# Patient Record
Sex: Male | Born: 1950
Health system: Southern US, Community
[De-identification: ages and names within clinical notes are randomized; demographics above are authoritative.]

## PROBLEM LIST (undated history)

## (undated) DIAGNOSIS — N62 Hypertrophy of breast: Secondary | ICD-10-CM

## (undated) DIAGNOSIS — M4802 Spinal stenosis, cervical region: Secondary | ICD-10-CM

## (undated) DIAGNOSIS — R001 Bradycardia, unspecified: Secondary | ICD-10-CM

## (undated) DIAGNOSIS — G9589 Other specified diseases of spinal cord: Secondary | ICD-10-CM

## (undated) DIAGNOSIS — I255 Ischemic cardiomyopathy: Secondary | ICD-10-CM

## (undated) DIAGNOSIS — K219 Gastro-esophageal reflux disease without esophagitis: Secondary | ICD-10-CM

## (undated) DIAGNOSIS — K621 Rectal polyp: Secondary | ICD-10-CM

## (undated) DIAGNOSIS — I2510 Atherosclerotic heart disease of native coronary artery without angina pectoris: Secondary | ICD-10-CM

## (undated) DIAGNOSIS — I480 Paroxysmal atrial fibrillation: Secondary | ICD-10-CM

## (undated) DIAGNOSIS — Z7902 Long term (current) use of antithrombotics/antiplatelets: Secondary | ICD-10-CM

## (undated) DIAGNOSIS — I10 Essential (primary) hypertension: Secondary | ICD-10-CM

## (undated) DIAGNOSIS — K649 Unspecified hemorrhoids: Secondary | ICD-10-CM

## (undated) DIAGNOSIS — I639 Cerebral infarction, unspecified: Secondary | ICD-10-CM

## (undated) DIAGNOSIS — Z87891 Personal history of nicotine dependence: Secondary | ICD-10-CM

## (undated) DIAGNOSIS — Z7982 Long term (current) use of aspirin: Secondary | ICD-10-CM

## (undated) DIAGNOSIS — I739 Peripheral vascular disease, unspecified: Secondary | ICD-10-CM

## (undated) DIAGNOSIS — Z91041 Radiographic dye allergy status: Secondary | ICD-10-CM

## (undated) DIAGNOSIS — E785 Hyperlipidemia, unspecified: Secondary | ICD-10-CM

## (undated) DIAGNOSIS — I200 Unstable angina: Secondary | ICD-10-CM

## (undated) DIAGNOSIS — G5602 Carpal tunnel syndrome, left upper limb: Secondary | ICD-10-CM

## (undated) DIAGNOSIS — I82722 Chronic embolism and thrombosis of deep veins of left upper extremity: Secondary | ICD-10-CM

## (undated) DIAGNOSIS — G5603 Carpal tunnel syndrome, bilateral upper limbs: Secondary | ICD-10-CM

## (undated) DIAGNOSIS — I2119 ST elevation (STEMI) myocardial infarction involving other coronary artery of inferior wall: Secondary | ICD-10-CM

## (undated) DIAGNOSIS — F1020 Alcohol dependence, uncomplicated: Secondary | ICD-10-CM

## (undated) HISTORY — PX: KNEE SURGERY: SHX244

## (undated) HISTORY — DX: Essential (primary) hypertension: I10

## (undated) HISTORY — DX: Hyperlipidemia, unspecified: E78.5

## (undated) HISTORY — DX: Unspecified hemorrhoids: K64.9

## (undated) HISTORY — DX: Unstable angina: I20.0

## (undated) HISTORY — DX: Peripheral vascular disease, unspecified: I73.9

## (undated) HISTORY — DX: Rectal polyp: K62.1

## (undated) HISTORY — PX: CARDIAC CATHETERIZATION: SHX172

---

## 1973-07-23 HISTORY — PX: APPENDECTOMY: SHX54

## 2005-04-24 ENCOUNTER — Inpatient Hospital Stay: Payer: Self-pay | Admitting: Internal Medicine

## 2005-04-24 ENCOUNTER — Other Ambulatory Visit: Payer: Self-pay

## 2005-09-25 ENCOUNTER — Inpatient Hospital Stay: Payer: Self-pay | Admitting: Internal Medicine

## 2005-09-25 ENCOUNTER — Other Ambulatory Visit: Payer: Self-pay

## 2005-09-26 DIAGNOSIS — I639 Cerebral infarction, unspecified: Secondary | ICD-10-CM

## 2005-09-26 HISTORY — DX: Cerebral infarction, unspecified: I63.9

## 2007-09-23 ENCOUNTER — Ambulatory Visit: Payer: Self-pay | Admitting: Gastroenterology

## 2007-10-17 ENCOUNTER — Emergency Department: Payer: Self-pay | Admitting: Emergency Medicine

## 2009-07-23 DIAGNOSIS — I639 Cerebral infarction, unspecified: Secondary | ICD-10-CM

## 2009-07-23 DIAGNOSIS — Z8673 Personal history of transient ischemic attack (TIA), and cerebral infarction without residual deficits: Secondary | ICD-10-CM | POA: Insufficient documentation

## 2009-07-23 HISTORY — DX: Cerebral infarction, unspecified: I63.9

## 2010-05-10 ENCOUNTER — Ambulatory Visit: Payer: Self-pay | Admitting: Specialist

## 2010-06-14 ENCOUNTER — Ambulatory Visit: Payer: Self-pay | Admitting: Specialist

## 2010-06-23 ENCOUNTER — Ambulatory Visit: Payer: Self-pay | Admitting: Specialist

## 2011-07-24 HISTORY — PX: ROTATOR CUFF REPAIR: SHX139

## 2014-07-26 DIAGNOSIS — L309 Dermatitis, unspecified: Secondary | ICD-10-CM

## 2014-07-26 HISTORY — DX: Dermatitis, unspecified: L30.9

## 2014-11-08 ENCOUNTER — Encounter: Payer: Self-pay | Admitting: General Surgery

## 2014-11-08 ENCOUNTER — Ambulatory Visit (INDEPENDENT_AMBULATORY_CARE_PROVIDER_SITE_OTHER): Payer: 59 | Admitting: General Surgery

## 2014-11-08 VITALS — BP 130/74 | Ht 67.0 in | Wt 184.0 lb

## 2014-11-08 DIAGNOSIS — K603 Anal fistula: Secondary | ICD-10-CM

## 2014-11-08 DIAGNOSIS — K610 Anal abscess: Secondary | ICD-10-CM

## 2014-11-08 NOTE — Patient Instructions (Addendum)
Anal Fistula An anal fistula is an abnormal tunnel that develops between the bowel and skin near the outside of the anus, where feces comes out. The anus has a number of tiny glands that make lubricating fluid. Sometimes these glands can become plugged and infected. This may lead to the development of a fluid-filled pocket (abscess). An anal fistula often develops after this infection or abscess. It is nearly always caused by a past or current anal abscess.  CAUSES  Though an anal fistula is almost always caused by a past or current anal abscess, other causes can include:  A complication of surgery.  Trauma to the rectal area.  Radiation to the area.  Other medical conditions or diseases, such as:   Chronic inflammatory bowel disease, such as Crohn disease or ulcerative colitis.   Colon or rectal cancer.   Diverticular disease, such as diverticulitis.   A sexually transmitted disease, such as gonorrhea, chlamydia, or syphilis.  An HIV infection or AIDS.  SYMPTOMS   Throbbing or constant pain that may be worse when sitting.   Swelling or irritation around the anus.   Drainage of pus or blood from an opening near the anus.   Pain with bowel movements.  Fever or chills. DIAGNOSIS  Your caregiver will examine the area to find the openings of the anal fistula and the fistula tract. The external opening of the anal fistula may be seen during a physical examination. Other examinations that may be performed include:   Examination of the rectal area with a gloved hand (digital rectal exam).   Examination with a probe or scope to help locate the internal opening of the fistula.   Injection of a dye into the fistula opening. X-rays can be taken to find the exact location and path of the fistula.   An MRI or ultrasound of the anal area.  Other tests may be performed to find the cause of the anal fistula.   TREATMENT  The most common treatment for an anal fistula is  surgery. There are different surgery options depending on where your fistula is located and how complex the fistula is. Surgical options include:  A fistulotomy. This surgery involves opening up the whole fistula and draining the contents inside to promote healing.  Seton placement. A silk string (seton) is placed into the fistula during a fistulotomy to drain any infection to promote healing.  Advancement flap procedure. Tissue is removed from your rectum or the skin around the anus and is attached to the opening of the fistula.  Bioprosthetic plug. A cone-shaped plug is made from your tissue and is used to block the opening of the fistula. Some anal fistulas do not require surgery. A fibrin glue is a non-surgical option that involves injecting the glue to seal the fistula. You also may be prescribed an antibiotic medicine to treat an infection.  HOME CARE INSTRUCTIONS   Take your antibiotics as directed. Finish them even if you start to feel better.  Only take over-the-counter or prescription medicines as directed by your caregiver.Use a stool softener or laxative, if recommended.   Eat a high-fiber diet to help avoid constipation or as directed by your caregiver.  Drink enough water to keep your urine clear or pale yellow.   A warm sitz bath may be soothing and help with healing. You may take warm sitz baths for 15-20 minutes, 3-4 times a day to ease pain and discomfort.   Follow excellent hygiene to keep the anal area  as clean and dry as possible. Use wet toilet paper or moist towelettes after each bowel movement.  SEEK MEDICAL CARE IF: You have increased pain not controlled with medicines.  SEEK IMMEDIATE MEDICAL CARE IF:  You have severe, intolerable pain.  You have new swelling, redness, or discharge around the anal area.  You have tenderness or warmth around the anal area.  You have chills or diarrhea.  You have severe problems urinating or having a bowel movement.    You have a fever or persistent symptoms for more than 2-3 days.   You have a fever and your symptoms suddenly get worse.  MAKE SURE YOU:   Understand these instructions.  Will watch your condition.  Will get help right away if you are not doing well or get worse. Document Released: 06/21/2008 Document Revised: 06/25/2012 Document Reviewed: 05/14/2011 [facility] Patient Information 2015 [place], [place]. This information is not intended to replace advice given to you by your health care provider. Make sure you discuss any questions you have with your health care provider.   Keep area clean. Warm water soaks as needed. Use wet wipes for cleaning. Follow up in one month.

## 2014-11-08 NOTE — Progress Notes (Signed)
Patient ID: Todd Weaver, male   DOB: 03/26/1951, 64 y.o.   MRN: [id number]  Chief Complaint  Patient presents with  . Other    boil on rectum    HPI Todd Weaver is a 64 y.o. male here today for a evaluation of a boil on rectum. Patient states he noticed this on Thursday. He states that the area has broken open yesterday and the pain is greatly lessened. He does have a history of this that seems to occur about once every couple of years. He had a colonoscopy done about 3 years ago that was reported as normal.  HPI  Past Medical History  Diagnosis Date  . Hyperlipidemia   . Hypertension   . Hemorrhoid     Past Surgical History  Procedure Laterality Date  . Rotator cuff repair Right 2013  . Appendectomy  1975  . Knee surgery Right age 62    History reviewed. No pertinent family history.  Social History History  Substance Use Topics  . Smoking status: Former Smoker    Quit date: 07/23/1998  . Smokeless tobacco: Never Used  . Alcohol Use: 0.0 oz/week    0 Standard drinks or equivalent per week    Allergies  Allergen Reactions  . Shellfish Allergy Hives    Current Outpatient Prescriptions  Medication Sig Dispense Refill  . aspirin EC 81 MG tablet Take 81 mg by mouth once.     Marland Kitchen atenolol (TENORMIN) 100 MG tablet Take 100 mg by mouth daily.   5  . atorvastatin (LIPITOR) 20 MG tablet Take 20 mg by mouth daily.   5   No current facility-administered medications for this visit.    Review of Systems Review of Systems  Constitutional: Negative.   Respiratory: Negative.   Cardiovascular: Negative.     Blood pressure 130/74, height 5\' 7"  (1.702 m), weight 184 lb (83.462 kg).  Physical Exam Physical Exam  Constitutional: He is oriented to person, place, and time. He appears well-developed and well-nourished.  Eyes: Conjunctivae are normal.  Neck: Neck supple.  Cardiovascular: Normal rate, regular rhythm and normal heart sounds.   Pulmonary/Chest: Effort normal  and breath sounds normal.  Abdominal: Soft. Bowel sounds are normal.  Genitourinary:  Purulent discharge from opening in the posterior region, 6 o'clock area, 1.5 cm from anus. Mildly tender.   Lymphadenopathy:    He has no cervical adenopathy.  Neurological: He is alert and oriented to person, place, and time.  Skin: Skin is warm and dry.    Data Reviewed Notes reviewed   Assessment    Based on history he may have a fistula. At present the abscess has drained spontaneously and he is feeling much better.      Plan    Above discussed with pt. Will reassess in 4-6 weeks.      PCP:  Orvilla Cornwall 11/09/2014, 6:39 AM

## 2014-11-09 ENCOUNTER — Encounter: Payer: Self-pay | Admitting: General Surgery

## 2014-12-06 ENCOUNTER — Encounter: Payer: Self-pay | Admitting: General Surgery

## 2014-12-06 ENCOUNTER — Ambulatory Visit (INDEPENDENT_AMBULATORY_CARE_PROVIDER_SITE_OTHER): Payer: 59 | Admitting: General Surgery

## 2014-12-06 VITALS — BP 120/68 | HR 76 | Resp 12 | Ht 67.0 in | Wt 184.0 lb

## 2014-12-06 DIAGNOSIS — K603 Anal fistula, unspecified: Secondary | ICD-10-CM

## 2014-12-06 NOTE — Patient Instructions (Addendum)
Anal Fistula An anal fistula is an abnormal tunnel that develops between the bowel and skin near the outside of the anus, where feces comes out. The anus has a number of tiny glands that make lubricating fluid. Sometimes these glands can become plugged and infected. This may lead to the development of a fluid-filled pocket (abscess). An anal fistula often develops after this infection or abscess. It is nearly always caused by a past or current anal abscess.  CAUSES  Though an anal fistula is almost always caused by a past or current anal abscess, other causes can include:  A complication of surgery.  Trauma to the rectal area.  Radiation to the area.  Other medical conditions or diseases, such as:   Chronic inflammatory bowel disease, such as Crohn disease or ulcerative colitis.   Colon or rectal cancer.   Diverticular disease, such as diverticulitis.   A sexually transmitted disease, such as gonorrhea, chlamydia, or syphilis.  An HIV infection or AIDS.  SYMPTOMS   Throbbing or constant pain that may be worse when sitting.   Swelling or irritation around the anus.   Drainage of pus or blood from an opening near the anus.   Pain with bowel movements.  Fever or chills. DIAGNOSIS  Your caregiver will examine the area to find the openings of the anal fistula and the fistula tract. The external opening of the anal fistula may be seen during a physical examination. Other examinations that may be performed include:   Examination of the rectal area with a gloved hand (digital rectal exam).   Examination with a probe or scope to help locate the internal opening of the fistula.   Injection of a dye into the fistula opening. X-rays can be taken to find the exact location and path of the fistula.   An MRI or ultrasound of the anal area.  Other tests may be performed to find the cause of the anal fistula.   TREATMENT  The most common treatment for an anal fistula is  surgery. There are different surgery options depending on where your fistula is located and how complex the fistula is. Surgical options include:  A fistulotomy. This surgery involves opening up the whole fistula and draining the contents inside to promote healing.  Seton placement. A silk string (seton) is placed into the fistula during a fistulotomy to drain any infection to promote healing.  Advancement flap procedure. Tissue is removed from your rectum or the skin around the anus and is attached to the opening of the fistula.  Bioprosthetic plug. A cone-shaped plug is made from your tissue and is used to block the opening of the fistula. Some anal fistulas do not require surgery. A fibrin glue is a non-surgical option that involves injecting the glue to seal the fistula. You also may be prescribed an antibiotic medicine to treat an infection.  HOME CARE INSTRUCTIONS   Take your antibiotics as directed. Finish them even if you start to feel better.  Only take over-the-counter or prescription medicines as directed by your caregiver.Use a stool softener or laxative, if recommended.   Eat a high-fiber diet to help avoid constipation or as directed by your caregiver.  Drink enough water to keep your urine clear or pale yellow.   A warm sitz bath may be soothing and help with healing. You may take warm sitz baths for 15-20 minutes, 3-4 times a day to ease pain and discomfort.   Follow excellent hygiene to keep the anal area  as clean and dry as possible. Use wet toilet paper or moist towelettes after each bowel movement.  SEEK MEDICAL CARE IF: You have increased pain not controlled with medicines.  SEEK IMMEDIATE MEDICAL CARE IF:  You have severe, intolerable pain.  You have new swelling, redness, or discharge around the anal area.  You have tenderness or warmth around the anal area.  You have chills or diarrhea.  You have severe problems urinating or having a bowel movement.    You have a fever or persistent symptoms for more than 2-3 days.   You have a fever and your symptoms suddenly get worse.  MAKE SURE YOU:   Understand these instructions.  Will watch your condition.  Will get help right away if you are not doing well or get worse. Document Released: 06/21/2008 Document Revised: 06/25/2012 Document Reviewed: 05/14/2011 [facility] Patient Information 2015 [place], [place]. This information is not intended to replace advice given to you by your health care provider. Make sure you discuss any questions you have with your health care provider.  Patient's surgery has been scheduled for 12-21-14 at [facility]. It is okay for patient to continue 81 mg aspirin once daily.

## 2014-12-06 NOTE — Progress Notes (Signed)
Patient ID: Todd Weaver, male   DOB: 05/16/1951, 64 y.o.   MRN: [id number]  Chief Complaint  Patient presents with  . Follow-up    perianal abscess and anal fistula    HPI Todd Weaver is a 64 y.o. male   here today following up from a perianal abscess and anal fistula. He reports the area has been itchy and irritated. He does report some drainage.  HPI  Past Medical History  Diagnosis Date  . Hyperlipidemia   . Hypertension   . Hemorrhoid     Past Surgical History  Procedure Laterality Date  . Rotator cuff repair Right 2013  . Appendectomy  1975  . Knee surgery Right age 33    No family history on file.  Social History History  Substance Use Topics  . Smoking status: Former Smoker    Quit date: 07/23/1998  . Smokeless tobacco: Never Used  . Alcohol Use: 0.0 oz/week    0 Standard drinks or equivalent per week    Allergies  Allergen Reactions  . Shellfish Allergy Hives    Current Outpatient Prescriptions  Medication Sig Dispense Refill  . aspirin EC 81 MG tablet Take 81 mg by mouth once.     Marland Kitchen atenolol (TENORMIN) 100 MG tablet Take 100 mg by mouth daily.   5  . atorvastatin (LIPITOR) 20 MG tablet Take 20 mg by mouth daily.   5   No current facility-administered medications for this visit.    Review of Systems Review of Systems  Constitutional: Negative.   Respiratory: Negative.   Cardiovascular: Negative.     Blood pressure 120/68, pulse 76, resp. rate 12, height 5\' 7"  (1.702 m), weight 184 lb (83.462 kg).  Physical Exam Physical Exam  Constitutional: He is oriented to person, place, and time. He appears well-developed and well-nourished.  Eyes: Conjunctivae are normal.  Neck: Neck supple. No thyromegaly present.  Cardiovascular: Normal rate, regular rhythm and normal heart sounds.   Pulmonary/Chest: Effort normal and breath sounds normal.  Abdominal: Soft. Normal appearance and bowel sounds are normal. There is no tenderness.  Genitourinary:      Neurological: He is alert and oriented to person, place, and time.  Skin: Skin is warm and dry.  External opening at 6 ocl, 1.5 cm from anal opening. Small amount of drainage. No abscess noted now  Data Reviewed None   Assessment    Anal fistula     Plan   Dicussed anal fistula surgery including possible ways to treat-lay it open remove the tract or use fibrin plug. Risks and benefits also explained. Pt is agreeable.     Patient's surgery has been scheduled for 12-21-14 at [facility]. It is okay for patient to continue 81 mg aspirin once daily.   Jermani Eberlein G 12/06/2014, 4:32 PM

## 2014-12-06 NOTE — Progress Notes (Deleted)
This is a 64 year old male here today following up from a perianal abscess and anal fistula. He reports the area has been itchy and irritated. He does report some drainage.

## 2014-12-14 ENCOUNTER — Encounter: Payer: Self-pay | Admitting: *Deleted

## 2014-12-14 ENCOUNTER — Other Ambulatory Visit: Payer: Self-pay

## 2014-12-14 DIAGNOSIS — Z91013 Allergy to seafood: Secondary | ICD-10-CM | POA: Diagnosis not present

## 2014-12-14 DIAGNOSIS — E785 Hyperlipidemia, unspecified: Secondary | ICD-10-CM | POA: Diagnosis not present

## 2014-12-14 DIAGNOSIS — Z79899 Other long term (current) drug therapy: Secondary | ICD-10-CM | POA: Diagnosis not present

## 2014-12-14 DIAGNOSIS — K603 Anal fistula: Secondary | ICD-10-CM | POA: Diagnosis present

## 2014-12-14 DIAGNOSIS — I10 Essential (primary) hypertension: Secondary | ICD-10-CM | POA: Diagnosis not present

## 2014-12-14 DIAGNOSIS — Z7982 Long term (current) use of aspirin: Secondary | ICD-10-CM | POA: Diagnosis not present

## 2014-12-14 DIAGNOSIS — Z87891 Personal history of nicotine dependence: Secondary | ICD-10-CM | POA: Diagnosis not present

## 2014-12-14 DIAGNOSIS — K648 Other hemorrhoids: Secondary | ICD-10-CM | POA: Diagnosis not present

## 2014-12-14 NOTE — Patient Instructions (Signed)
  Your procedure is scheduled on: 12-21-14 Report to [place] To find out your arrival time please call [phone] between 1PM - 3PM on 12-17-14 (FRIDAY)  Remember: Instructions that are not followed completely may result in serious medical risk, up to and including death, or upon the discretion of your surgeon and anesthesiologist your surgery may need to be rescheduled.    _X___ 1. Do not eat food or drink liquids after midnight. No gum chewing or hard candies.     _X___ 2. No Alcohol for 24 hours before or after surgery.   ____ 3. Bring all medications with you on the day of surgery if instructed.    _X___ 4. Notify your doctor if there is any change in your medical condition     (cold, fever, infections).     Do not wear jewelry, make-up, hairpins, clips or nail polish.  Do not wear lotions, powders, or perfumes. You may wear deodorant.  Do not shave 48 hours prior to surgery. Men may shave face and neck.  Do not bring valuables to the hospital.    [facility] is not responsible for any belongings or valuables.               Contacts, dentures or bridgework may not be worn into surgery.  Leave your suitcase in the car. After surgery it may be brought to your room.  For patients admitted to the hospital, discharge time is determined by your                treatment team.   Patients discharged the day of surgery will not be allowed to drive home.   Please read over the following fact sheets that you were given:              CHG INSTRUCTIONS   __X__ Take these medicines the morning of surgery with A SIP OF WATER:    1. ATENOLOL  2. LIPITOR  3.   4.  5.  6.  ____ Fleet Enema (as directed)   ____ Use CHG Soap as directed  ____ Use inhalers on the day of surgery  ____ Stop metformin 2 days prior to surgery    ____ Take 1/2 of usual insulin dose the night before surgery and none on the morning of surgery.   __X__ Stop  Coumadin/Plavix/aspirin-ASK DR Jamal Collin ABOUT ASPIRIN  ____ Stop Anti-inflammatories-NO NSAIDS OR ASPIRIN PRODUCTS-TYLENOL OK   ____ Stop supplements until after surgery.    ____ Bring C-Pap to the hospital.

## 2014-12-16 ENCOUNTER — Encounter
Admission: RE | Admit: 2014-12-16 | Discharge: 2014-12-16 | Disposition: A | Discharge status: Home / Self Care | Payer: 59 | Source: Ambulatory Visit | Attending: Anesthesiology | Admitting: Anesthesiology

## 2014-12-16 DIAGNOSIS — I10 Essential (primary) hypertension: Secondary | ICD-10-CM | POA: Insufficient documentation

## 2014-12-16 DIAGNOSIS — Z01810 Encounter for preprocedural cardiovascular examination: Secondary | ICD-10-CM | POA: Diagnosis present

## 2014-12-21 ENCOUNTER — Encounter: Payer: Self-pay | Admitting: *Deleted

## 2014-12-21 ENCOUNTER — Ambulatory Visit: Payer: 59 | Admitting: Anesthesiology

## 2014-12-21 ENCOUNTER — Ambulatory Visit
Admission: RE | Admit: 2014-12-21 | Discharge: 2014-12-21 | Disposition: A | Discharge status: Home / Self Care | Payer: 59 | Source: Ambulatory Visit | Attending: General Surgery | Admitting: General Surgery

## 2014-12-21 ENCOUNTER — Encounter
Admission: RE | Disposition: A | Discharge status: Home / Self Care | Payer: Self-pay | Source: Ambulatory Visit | Attending: General Surgery

## 2014-12-21 DIAGNOSIS — K603 Anal fistula: Secondary | ICD-10-CM | POA: Insufficient documentation

## 2014-12-21 DIAGNOSIS — E785 Hyperlipidemia, unspecified: Secondary | ICD-10-CM | POA: Insufficient documentation

## 2014-12-21 DIAGNOSIS — Z7982 Long term (current) use of aspirin: Secondary | ICD-10-CM | POA: Insufficient documentation

## 2014-12-21 DIAGNOSIS — Z91013 Allergy to seafood: Secondary | ICD-10-CM | POA: Insufficient documentation

## 2014-12-21 DIAGNOSIS — Z87891 Personal history of nicotine dependence: Secondary | ICD-10-CM | POA: Insufficient documentation

## 2014-12-21 DIAGNOSIS — K641 Second degree hemorrhoids: Secondary | ICD-10-CM | POA: Diagnosis not present

## 2014-12-21 DIAGNOSIS — K648 Other hemorrhoids: Secondary | ICD-10-CM | POA: Insufficient documentation

## 2014-12-21 DIAGNOSIS — I10 Essential (primary) hypertension: Secondary | ICD-10-CM | POA: Insufficient documentation

## 2014-12-21 DIAGNOSIS — Z79899 Other long term (current) drug therapy: Secondary | ICD-10-CM | POA: Insufficient documentation

## 2014-12-21 HISTORY — DX: Gastro-esophageal reflux disease without esophagitis: K21.9

## 2014-12-21 HISTORY — PX: HEMORRHOID SURGERY: SHX153

## 2014-12-21 HISTORY — PX: ANAL FISTULECTOMY: SHX1139

## 2014-12-21 HISTORY — DX: Cerebral infarction, unspecified: I63.9

## 2014-12-21 SURGERY — FISTULECTOMY, ANAL
Anesthesia: General | Site: Anus | Wound class: Clean Contaminated

## 2014-12-21 MED ORDER — FENTANYL CITRATE (PF) 100 MCG/2ML IJ SOLN: 25.0000 ug | INTRAMUSCULAR | Status: DC | PRN

## 2014-12-21 MED ORDER — BACIT-POLY-NEO HC 1 % EX OINT
TOPICAL_OINTMENT | CUTANEOUS | Status: DC | PRN
  Administered 2014-12-21: 1

## 2014-12-21 MED ORDER — GLYCOPYRROLATE 0.2 MG/ML IJ SOLN
0.2000 mg | Freq: Once | INTRAMUSCULAR | Status: AC
  Administered 2014-12-21: 0.2 mg via INTRAVENOUS

## 2014-12-21 MED ORDER — OXYCODONE-ACETAMINOPHEN 5-325 MG PO TABS: 1.0000 | ORAL_TABLET | ORAL | Status: DC | PRN

## 2014-12-21 MED ORDER — BUPIVACAINE HCL (PF) 0.5 % IJ SOLN
INTRAMUSCULAR | Status: DC | PRN
  Administered 2014-12-21: 17 mL

## 2014-12-21 MED ORDER — BUPIVACAINE HCL (PF) 0.5 % IJ SOLN
INTRAMUSCULAR | Status: AC
  Filled 2014-12-21: qty 30

## 2014-12-21 MED ORDER — PROPOFOL 10 MG/ML IV BOLUS
INTRAVENOUS | Status: DC | PRN
  Administered 2014-12-21: 140 mg via INTRAVENOUS

## 2014-12-21 MED ORDER — FAMOTIDINE 20 MG PO TABS
ORAL_TABLET | ORAL | Status: AC
  Administered 2014-12-21: 20 mg via ORAL
  Filled 2014-12-21: qty 1

## 2014-12-21 MED ORDER — OXYCODONE-ACETAMINOPHEN 5-325 MG PO TABS
1.0000 | ORAL_TABLET | ORAL | Status: DC | PRN
Start: 2014-12-21 — End: 2014-12-21
  Administered 2014-12-21: 1 via ORAL

## 2014-12-21 MED ORDER — MIDAZOLAM HCL 2 MG/2ML IJ SOLN
INTRAMUSCULAR | Status: DC | PRN
  Administered 2014-12-21: 2 mg via INTRAVENOUS

## 2014-12-21 MED ORDER — GLYCOPYRROLATE 0.2 MG/ML IJ SOLN
INTRAMUSCULAR | Status: AC
  Filled 2014-12-21: qty 1

## 2014-12-21 MED ORDER — LIDOCAINE HCL (PF) 1 % IJ SOLN
INTRAMUSCULAR | Status: AC
  Filled 2014-12-21: qty 30

## 2014-12-21 MED ORDER — FENTANYL CITRATE (PF) 100 MCG/2ML IJ SOLN
INTRAMUSCULAR | Status: DC | PRN
  Administered 2014-12-21: 25 ug via INTRAVENOUS
  Administered 2014-12-21: 50 ug via INTRAVENOUS

## 2014-12-21 MED ORDER — ONDANSETRON HCL 4 MG/2ML IJ SOLN: 4.0000 mg | Freq: Once | INTRAMUSCULAR | Status: DC | PRN

## 2014-12-21 MED ORDER — GLYCOPYRROLATE 0.2 MG/ML IJ SOLN
INTRAMUSCULAR | Status: AC
  Administered 2014-12-21: 0.2 mg via INTRAVENOUS
  Filled 2014-12-21: qty 1

## 2014-12-21 MED ORDER — ACETAMINOPHEN 10 MG/ML IV SOLN
INTRAVENOUS | Status: AC
  Filled 2014-12-21: qty 100

## 2014-12-21 MED ORDER — LACTATED RINGERS IV SOLN
INTRAVENOUS | Status: DC
  Administered 2014-12-21 (×2): via INTRAVENOUS

## 2014-12-21 MED ORDER — HYDROMORPHONE HCL 1 MG/ML IJ SOLN: 0.2500 mg | INTRAMUSCULAR | Status: DC | PRN

## 2014-12-21 MED ORDER — BACITRACIN ZINC 500 UNIT/GM EX OINT
TOPICAL_OINTMENT | CUTANEOUS | Status: AC
  Filled 2014-12-21: qty 28.35

## 2014-12-21 MED ORDER — FAMOTIDINE 20 MG PO TABS
20.0000 mg | ORAL_TABLET | Freq: Once | ORAL | Status: AC
  Administered 2014-12-21: 20 mg via ORAL

## 2014-12-21 MED ORDER — LIDOCAINE HCL (CARDIAC) 20 MG/ML IV SOLN
INTRAVENOUS | Status: DC | PRN
  Administered 2014-12-21: 30 mg via INTRAVENOUS

## 2014-12-21 MED ORDER — OXYCODONE-ACETAMINOPHEN 5-325 MG PO TABS
ORAL_TABLET | ORAL | Status: AC
  Filled 2014-12-21: qty 1

## 2014-12-21 SURGICAL SUPPLY — 25 items
BLADE SURG 11 STRL SS SAFETY (MISCELLANEOUS) ×4 IMPLANT
BLADE SURG 15 STRL SS SAFETY (BLADE) ×4 IMPLANT
BRIEF STRETCH MATERNITY 2XLG (MISCELLANEOUS) ×4 IMPLANT
CANISTER SUCT 1200ML W/VALVE (MISCELLANEOUS) ×4 IMPLANT
DECANTER SPIKE VIAL GLASS SM (MISCELLANEOUS) ×4 IMPLANT
DRAPE LEGGINS SURG 28X43 STRL (DRAPES) ×4 IMPLANT
DRAPE PED LAPAROTOMY (DRAPES) ×4 IMPLANT
DRAPE UNDER BUTTOCK W/FLU (DRAPES) ×4 IMPLANT
GLOVE BIO SURGEON STRL SZ7 (GLOVE) ×12 IMPLANT
GOWN STRL REUS W/ TWL LRG LVL3 (GOWN DISPOSABLE) ×4 IMPLANT
GOWN STRL REUS W/TWL LRG LVL3 (GOWN DISPOSABLE) ×4
JELLY LUB 2OZ STRL (MISCELLANEOUS) ×2
JELLY LUBE 2OZ STRL (MISCELLANEOUS) ×2 IMPLANT
LABEL OR SOLS (LABEL) ×4 IMPLANT
NDL SAFETY 25GX1.5 (NEEDLE) ×4 IMPLANT
PACK BASIN MINOR ARMC (MISCELLANEOUS) ×4 IMPLANT
PAD GROUND ADULT SPLIT (MISCELLANEOUS) ×4 IMPLANT
PAD OB MATERNITY 4.3X12.25 (PERSONAL CARE ITEMS) ×4 IMPLANT
PAD PREP 24X41 OB/GYN DISP (PERSONAL CARE ITEMS) ×4 IMPLANT
SOL PREP PVP 2OZ (MISCELLANEOUS) ×4
SOLUTION PREP PVP 2OZ (MISCELLANEOUS) ×2 IMPLANT
STRAP SAFETY BODY (MISCELLANEOUS) ×4 IMPLANT
SUT VIC AB 3-0 SH 27 (SUTURE) ×2
SUT VIC AB 3-0 SH 27X BRD (SUTURE) ×2 IMPLANT
SYR CONTROL 10ML (SYRINGE) ×4 IMPLANT

## 2014-12-21 NOTE — OR Nursing (Signed)
Dr Kayleen Memos aware of heart rate still low at 40-42, patient asymptomatic, bp stable, repeated Robinol 0.2mg  iv

## 2014-12-21 NOTE — Transfer of Care (Signed)
Immediate Anesthesia Transfer of Care Note  Patient: Todd Weaver  Procedure(s) Performed: Procedure(s): FISTULECTOMY ANAL (N/A) HEMORRHOIDECTOMY (N/A)  Patient Location: PACU  Anesthesia Type:General  Level of Consciousness: sedated  Airway & Oxygen Therapy: Patient Spontanous Breathing and Patient connected to face mask oxygen  Post-op Assessment: Report given to RN and Post -op Vital signs reviewed and stable  Post vital signs: Reviewed and stable  Last Vitals:  97.2  009/38 45 16  Complications: No apparent anesthesia complications

## 2014-12-21 NOTE — Op Note (Signed)
Preop diagnosis: Anal fistula  Post op diagnosis: Anal fistula and internal hemorrhoid  Operation: Anal fistulotomy and hemorrhoidectomy  Surgeon:S.G. Jamal Collin Assist:  Anesthesia: Gen.  Complications: None   EBL: Minimal  Drains: None  Description: Patient was put to sleep with an LMA and then placed in the lithotomy position on the operating table. Timeout was performed. Anal area was prepped and draped sterile field. The patient had an external opening 25 to 6:00 position about a centimeter and a half away from the anal orifice. There was also noted be an internal hemorrhoid located about the 4 to 5:00 location and appeared to be mildly ulcerated speculum examination showed no other findings. The external opening was probed with a sinus probe and in the tract of the fistula was easily identified being superficial and did not involve the muscle. It was therefore decided to flatus open and cauterized the unhealthy tissue. About that 15-20 mL of half percent Marcaine was instilled for postop analgesia. The fistulous tract was laid open and cauterized. The internal hemorrhoid was then grasped and suture ligated with 30 Vicryls and    removed.  The area was covered with bacitracin ointment. And dressing applied. Patient subsequently extubated and returned recovery room in stable condition.

## 2014-12-21 NOTE — Anesthesia Procedure Notes (Signed)
Procedure Name: LMA Insertion Performed by: Wilhelm Ganaway Pre-anesthesia Checklist: Patient identified, Emergency Drugs available, Suction available, Patient being monitored and Timeout performed Patient Re-evaluated:Patient Re-evaluated prior to inductionOxygen Delivery Method: Circle system utilized Preoxygenation: Pre-oxygenation with 100% oxygen Intubation Type: IV induction LMA: LMA inserted LMA Size: 3.5 Number of attempts: 1 Placement Confirmation: positive ETCO2 and breath sounds checked- equal and bilateral Tube secured with: Tape

## 2014-12-21 NOTE — OR Nursing (Signed)
Dr Kayleen Memos in heart rate remains 44, Robinol 0.2mg  per Dr Liliana Cline order for heart rate of 44 asymptomatic.

## 2014-12-21 NOTE — Anesthesia Preprocedure Evaluation (Addendum)
Anesthesia Evaluation  Patient identified by MRN, date of birth, ID band Patient awake    Reviewed: Allergy & Precautions, NPO status , Patient's Chart, lab work & pertinent test results, reviewed documented beta blocker date and time   History of Anesthesia Complications Negative for: history of anesthetic complications  Airway Mallampati: II  TM Distance: >3 FB Neck ROM: Full    Dental no notable dental hx. (+) Missing,    Pulmonary neg pulmonary ROS, former smoker,  breath sounds clear to auscultation  Pulmonary exam normal       Cardiovascular Exercise Tolerance: Good hypertension, Pt. on medications and Pt. on home beta blockers negative cardio ROS Normal cardiovascular examRhythm:Regular Rate:Normal     Neuro/Psych CVA, No Residual Symptoms negative psych ROS   GI/Hepatic Neg liver ROS, GERD-  Controlled,  Endo/Other  negative endocrine ROS  Renal/GU negative Renal ROS  negative genitourinary   Musculoskeletal negative musculoskeletal ROS (+)   Abdominal   Peds negative pediatric ROS (+)  Hematology negative hematology ROS (+)   Anesthesia Other Findings   Reproductive/Obstetrics negative OB ROS                            Anesthesia Physical Anesthesia Plan  ASA: III  Anesthesia Plan: General   Post-op Pain Management:    Induction: Intravenous  Airway Management Planned: LMA  Additional Equipment:   Intra-op Plan:   Post-operative Plan: Extubation in OR  Informed Consent: I have reviewed the patients History and Physical, chart, labs and discussed the procedure including the risks, benefits and alternatives for the proposed anesthesia with the patient or authorized representative who has indicated his/her understanding and acceptance.   Dental advisory given  Plan Discussed with: CRNA and Surgeon  Anesthesia Plan Comments:         Anesthesia Quick  Evaluation

## 2014-12-21 NOTE — Anesthesia Postprocedure Evaluation (Signed)
  Anesthesia Post-op Note  Patient: Todd Weaver  Procedure(s) Performed: Procedure(s): FISTULECTOMY ANAL (N/A) HEMORRHOIDECTOMY (N/A)  Anesthesia type:General  Patient location: PACU  Post pain: Pain level controlled  Post assessment: Post-op Vital signs reviewed, Patient's Cardiovascular Status Stable, Respiratory Function Stable, Patent Airway and No signs of Nausea or vomiting  Post vital signs: Reviewed and stable  Last Vitals:  Filed Vitals:   12/21/14 0945  BP: 147/72  Pulse: 44  Temp:   Resp: 11    Level of consciousness: awake, alert  and patient cooperative  Complications: No apparent anesthesia complications

## 2014-12-21 NOTE — Interval H&P Note (Signed)
History and Physical Interval Note:  12/21/2014 8:05 AM  Todd Weaver  has presented today for surgery, with the diagnosis of ANAL FISTULA  The various methods of treatment have been discussed with the patient and family. After consideration of risks, benefits and other options for treatment, the patient has consented to  Procedure(s): FISTULECTOMY ANAL (N/A) as a surgical intervention .  The patient's history has been reviewed, patient examined, no change in status, stable for surgery.  I have reviewed the patient's chart and labs.  Questions were answered to the patient's satisfaction.     Sabre Leonetti G

## 2014-12-21 NOTE — Discharge Instructions (Signed)
AMBULATORY SURGERY  DISCHARGE INSTRUCTIONS   1) The drugs that you were given will stay in your system until tomorrow so for the next 24 hours you should not:  A) Drive an automobile B) Make any legal decisions C) Drink any alcoholic beverage   2) You may resume regular meals tomorrow.  Today it is better to start with liquids and gradually work up to solid foods.  You may eat anything you prefer, but it is better to start with liquids, then soup and crackers, and gradually work up to solid foods.   3) Please notify your doctor immediately if you have any unusual bleeding, trouble breathing, redness and pain at the surgery site, drainage, fever, or pain not relieved by medication. 4)   5) Your post-operative visit with Dr.    George Ina                                 is: Date:                        Time:    Please call to schedule your post-operative visit.  6) Additional Instructions: 7)

## 2014-12-21 NOTE — H&P (View-Only) (Signed)
Patient ID: Todd Weaver, male   DOB: Feb 18, 1951, 64 y.o.   MRN: [id number]  Chief Complaint  Patient presents with  . Follow-up    perianal abscess and anal fistula    HPI Todd Weaver is a 64 y.o. male   here today following up from a perianal abscess and anal fistula. He reports the area has been itchy and irritated. He does report some drainage.  HPI  Past Medical History  Diagnosis Date  . Hyperlipidemia   . Hypertension   . Hemorrhoid     Past Surgical History  Procedure Laterality Date  . Rotator cuff repair Right 2013  . Appendectomy  1975  . Knee surgery Right age 8    No family history on file.  Social History History  Substance Use Topics  . Smoking status: Former Smoker    Quit date: 07/23/1998  . Smokeless tobacco: Never Used  . Alcohol Use: 0.0 oz/week    0 Standard drinks or equivalent per week    Allergies  Allergen Reactions  . Shellfish Allergy Hives    Current Outpatient Prescriptions  Medication Sig Dispense Refill  . aspirin EC 81 MG tablet Take 81 mg by mouth once.     Marland Kitchen atenolol (TENORMIN) 100 MG tablet Take 100 mg by mouth daily.   5  . atorvastatin (LIPITOR) 20 MG tablet Take 20 mg by mouth daily.   5   No current facility-administered medications for this visit.    Review of Systems Review of Systems  Constitutional: Negative.   Respiratory: Negative.   Cardiovascular: Negative.     Blood pressure 120/68, pulse 76, resp. rate 12, height 5\' 7"  (1.702 m), weight 184 lb (83.462 kg).  Physical Exam Physical Exam  Constitutional: He is oriented to person, place, and time. He appears well-developed and well-nourished.  Eyes: Conjunctivae are normal.  Neck: Neck supple. No thyromegaly present.  Cardiovascular: Normal rate, regular rhythm and normal heart sounds.   Pulmonary/Chest: Effort normal and breath sounds normal.  Abdominal: Soft. Normal appearance and bowel sounds are normal. There is no tenderness.  Genitourinary:      Neurological: He is alert and oriented to person, place, and time.  Skin: Skin is warm and dry.  External opening at 6 ocl, 1.5 cm from anal opening. Small amount of drainage. No abscess noted now  Data Reviewed None   Assessment    Anal fistula     Plan   Dicussed anal fistula surgery including possible ways to treat-lay it open remove the tract or use fibrin plug. Risks and benefits also explained. Pt is agreeable.     Patient's surgery has been scheduled for 12-21-14 at [facility]. It is okay for patient to continue 81 mg aspirin once daily.   SANKAR,SEEPLAPUTHUR G 12/06/2014, 4:32 PM

## 2014-12-22 LAB — SURGICAL PATHOLOGY

## 2014-12-27 ENCOUNTER — Telehealth: Payer: Self-pay

## 2014-12-27 NOTE — Telephone Encounter (Signed)
Pt was called and avised to use Nupercainol oint. His wife will pick up Rx for this and Percocte

## 2014-12-27 NOTE — Telephone Encounter (Signed)
Patient called with complaints of pain post surgery. He had a fistulotomy done on 12/21/14. He states that he is having a lot of pain and some bleeding after a bowel movement. He also reports itching in the area. He states that he has been using warm sitz baths for comfort but this only seems to provide relief for about an hour. He reports that he has been using his pain medications provided and that he only has one dose left. He reports that he has pain between bowel movements also that comes and goes.

## 2014-12-30 ENCOUNTER — Ambulatory Visit (INDEPENDENT_AMBULATORY_CARE_PROVIDER_SITE_OTHER): Payer: 59 | Admitting: General Surgery

## 2014-12-30 ENCOUNTER — Encounter: Payer: Self-pay | Admitting: General Surgery

## 2014-12-30 ENCOUNTER — Ambulatory Visit: Payer: 59 | Admitting: General Surgery

## 2014-12-30 VITALS — BP 134/78 | HR 66 | Resp 12 | Ht 67.0 in | Wt 182.0 lb

## 2014-12-30 DIAGNOSIS — K603 Anal fistula: Secondary | ICD-10-CM

## 2014-12-30 NOTE — Progress Notes (Signed)
Here today for postoperative visit, anal fistulotomy and hemorrhoidectomy on 12-21-14. Patient reports a lot of burning and itching. Painful passing bowel movements.  Exam shows fistulotomy site is healing well. No new findings noted. Pt advised to use Nupercainol oint for the discomfort. May increase activity as tolerated. Recheck in 1 mo

## 2014-12-30 NOTE — Patient Instructions (Signed)
The patient is aware to call back for any questions or concerns.  

## 2015-01-17 ENCOUNTER — Ambulatory Visit (INDEPENDENT_AMBULATORY_CARE_PROVIDER_SITE_OTHER): Payer: 59 | Admitting: General Surgery

## 2015-01-17 ENCOUNTER — Encounter: Payer: Self-pay | Admitting: General Surgery

## 2015-01-17 VITALS — BP 140/70 | HR 74 | Resp 14 | Ht 67.0 in | Wt 189.0 lb

## 2015-01-17 DIAGNOSIS — K603 Anal fistula: Secondary | ICD-10-CM

## 2015-01-17 NOTE — Progress Notes (Signed)
This is a 64 year old male here today for postoperative visit, anal fistulotomy and hemorrhoidectomy on 12-21-14. Patient states he is doing well. No problems at this time.No longer taking pain medication.   Exam: fistulotomy site is completely healed. No new findings today.   Follow up as needed  PCP:  Nicky Pugh, MD

## 2015-01-17 NOTE — Patient Instructions (Signed)
The patient is aware to call back for any questions or concerns.  

## 2016-02-16 ENCOUNTER — Ambulatory Visit
Admission: RE | Admit: 2016-02-16 | Discharge: 2016-02-16 | Disposition: A | Discharge status: Home / Self Care | Payer: Managed Care, Other (non HMO) | Source: Ambulatory Visit | Attending: Internal Medicine | Admitting: Internal Medicine

## 2016-02-16 ENCOUNTER — Other Ambulatory Visit: Payer: Self-pay | Admitting: Internal Medicine

## 2016-02-16 DIAGNOSIS — R05 Cough: Secondary | ICD-10-CM | POA: Insufficient documentation

## 2016-02-16 DIAGNOSIS — R0602 Shortness of breath: Secondary | ICD-10-CM

## 2016-02-16 DIAGNOSIS — R059 Cough, unspecified: Secondary | ICD-10-CM

## 2016-02-16 DIAGNOSIS — I700 Atherosclerosis of aorta: Secondary | ICD-10-CM | POA: Insufficient documentation

## 2016-02-16 DIAGNOSIS — Z87891 Personal history of nicotine dependence: Secondary | ICD-10-CM | POA: Diagnosis not present

## 2016-03-13 ENCOUNTER — Encounter: Payer: Self-pay | Admitting: Cardiology

## 2016-03-13 ENCOUNTER — Encounter (INDEPENDENT_AMBULATORY_CARE_PROVIDER_SITE_OTHER): Payer: Self-pay

## 2016-03-13 ENCOUNTER — Ambulatory Visit (INDEPENDENT_AMBULATORY_CARE_PROVIDER_SITE_OTHER): Payer: Managed Care, Other (non HMO) | Admitting: Cardiology

## 2016-03-13 VITALS — BP 130/70 | HR 49 | Ht 67.0 in | Wt 187.7 lb

## 2016-03-13 DIAGNOSIS — R5382 Chronic fatigue, unspecified: Secondary | ICD-10-CM | POA: Diagnosis not present

## 2016-03-13 DIAGNOSIS — R531 Weakness: Secondary | ICD-10-CM | POA: Diagnosis not present

## 2016-03-13 DIAGNOSIS — R001 Bradycardia, unspecified: Secondary | ICD-10-CM | POA: Diagnosis not present

## 2016-03-13 NOTE — Progress Notes (Signed)
Cardiology Office Note   Date:  03/13/2016   ID:  Todd Weaver, Todd Weaver 07-21-1951, MRN [id number]  Referring Doctor:  Marden Noble, MD   Cardiologist:   Wende Bushy, MD   Reason for consultation:  Chief Complaint  Patient presents with  . Other    C/o fatigue, sob and weakness. Meds reviewed verbally with pt.      History of Present Illness: Todd Weaver is a 65 y.o. male who presents for Fatigue and tiredness. This has been going on for several months now. He just feels very tired before doing any physical activity. Once he is able to start doing the activity, his energy level somewhat stabilizes. There is some shortness of breath initiating an activity but that also goes away as he continues to do the physical activity. There is no chest pain, palpitations, loss of consciousness.  Patient reports that he usually only sleeps 3-4 hours at night. Because of this, he feels tired in the day and has to have one or 2 naps at work.  In terms of hypertension, he is not routinely checking his blood pressure at home. He has been on atenolol for several years now. He remembers that dose was being adjusted in the beginning.  ROS:  Please see the history of present illness. Aside from mentioned under HPI, all other systems are reviewed and negative.     Past Medical History:  Diagnosis Date  . GERD (gastroesophageal reflux disease)   . Hemorrhoid   . Hyperlipidemia   . Hypertension   . Stroke [facility]) 2011   "2 MINI STROKES"    Past Surgical History:  Procedure Laterality Date  . ANAL FISTULECTOMY N/A 12/21/2014   Procedure: FISTULECTOMY ANAL;  Surgeon: Christene Lye, MD;  Location: [facility] ORS;  Service: General;  Laterality: N/A;  . APPENDECTOMY  1975  . HEMORRHOID SURGERY N/A 12/21/2014   Procedure: HEMORRHOIDECTOMY;  Surgeon: Christene Lye, MD;  Location: [facility] ORS;  Service: General;  Laterality: N/A;  . KNEE SURGERY Right age 66  . ROTATOR CUFF REPAIR Right  2013     reports that he quit smoking about 17 years ago. He has never used smokeless tobacco. He reports that he drinks alcohol. He reports that he does not use drugs.   family history includes Hypertension in his father and mother.   Outpatient Medications Prior to Visit  Medication Sig Dispense Refill  . aspirin EC 81 MG tablet Take 81 mg by mouth once.     Marland Kitchen atenolol (TENORMIN) 100 MG tablet Take 100 mg by mouth every morning.   5  . atorvastatin (LIPITOR) 20 MG tablet Take 20 mg by mouth every morning.   5  . oxyCODONE-acetaminophen (ROXICET) 5-325 MG per tablet Take 1 tablet by mouth every 4 (four) hours as needed for moderate pain or severe pain. (Patient not taking: Reported on 03/13/2016) 30 tablet 0   No facility-administered medications prior to visit.      Allergies: Shellfish allergy    PHYSICAL EXAM: VS:  BP 130/70 (BP Location: Right Arm, Patient Position: Sitting, Cuff Size: Normal)   Pulse (!) 49   Ht 5\' 7"  (1.702 m)   Wt 187 lb 11 oz (85.1 kg)   BMI 29.40 kg/m  , Body mass index is 29.4 kg/m. Wt Readings from Last 3 Encounters:  03/13/16 187 lb 11 oz (85.1 kg)  01/17/15 189 lb (85.7 kg)  12/30/14 182 lb (82.6 kg)  GENERAL:  well developed, well nourished, not in acute distress HEENT: normocephalic, pink conjunctivae, anicteric sclerae, no xanthelasma, normal dentition, oropharynx clear NECK:  no neck vein engorgement, JVP normal, no hepatojugular reflux, carotid upstroke brisk and symmetric, no bruit, no thyromegaly, no lymphadenopathy LUNGS:  good respiratory effort, clear to auscultation bilaterally CV:  PMI not displaced, no thrills, no lifts, S1 and S2 within normal limits, no palpable S3 or S4, no murmurs, no rubs, no gallops ABD:  Soft, nontender, nondistended, normoactive bowel sounds, no abdominal aortic bruit, no hepatomegaly, no splenomegaly MS: nontender back, no kyphosis, no scoliosis, no joint deformities EXT:  2+ DP/PT pulses, no edema, no  varicosities, no cyanosis, no clubbing SKIN: warm, nondiaphoretic, normal turgor, no ulcers NEUROPSYCH: alert, oriented to person, place, and time, sensory/motor grossly intact, normal mood, appropriate affect  Recent Labs: No results found for requested labs within last 8760 hours.   Lipid Panel No results found for: CHOL, TRIG, HDL, CHOLHDL, VLDL, LDLCALC, LDLDIRECT   Other studies Reviewed:  EKG:  The ekg from 03/13/2016 was personally reviewed by me and it revealed sinus bradycardia, 49 BPM.  Additional studies/ records that were reviewed personally reviewed by me today include: None available   ASSESSMENT AND PLAN:  Fatigue Some shortness of breath but this dissipated as he continues with the physical activity Note of sinus bradycardia on EKG Recommend to decrease the dose of his atenolol from 100 mg once a day to 50 mg once day. Patient to do blood pressure log. Patient to call our office if his pressure is persistently greater than 140/80. Patient to follow-up in 2 weeks with his blood pressure and heart rate log. The also discussed importance of good sleep hygiene. Recommended that he try to avoid taking naps in the day so that he will likely sleep longer at night. Patient also has to follow up with PCP for this. We'll also check thyroid function tests if not done on his next visit. We'll also send him for an echo cardiac exam at that time.  HTN BP log with dose adjustment If blood pressure is uncontrolled or heart rate is still low despite atenolol dose adjustment, will likely end of switching to another blood pressure medication.  Current medicines are reviewed at length with the patient today.  The patient does not have concerns regarding medicines.  Labs/ tests ordered today include: Orders Placed This Encounter  Procedures  . EKG 12-Lead    I had a lengthy and detailed discussion with the patient regarding diagnoses, prognosis, diagnostic options, treatment options  , and side effects of medications.   I counseled the patient on importance of lifestyle modification including heart healthy diet, regular physical activity   Disposition:   FU with undersigned in 2 weeks  I spent at least 60 minutes with the patient today and more than 50% of the time was spent counseling the patient and coordinating care.    Signed, Wende Bushy, MD  03/13/2016 2:49 PM    [place]  This note was generated in part with voice recognition software and I apologize for any typographical errors that were not detected and corrected.

## 2016-03-13 NOTE — Patient Instructions (Signed)
Medication Instructions:  Your physician has recommended you make the following change in your medication:  1. Decrease atenolol to 50 mg once daily which is 1/2 tablet.   Follow-Up: Your physician recommends that you schedule a follow-up appointment in: 2 weeks with Dr. Yvone Neu  Please call our office if your blood pressures remain greater than 140/80. Your physician has requested that you regularly monitor and record your blood pressure readings at home. Please use the same machine at the same time of day to check your readings and record them to bring to your follow-up visit.    It was a pleasure seeing you today here in the office. Please do not hesitate to give [place] a call back if you have any further questions. [place], BSN

## 2016-03-20 NOTE — Progress Notes (Signed)
Cardiology Office Note   Date:  03/27/2016   ID:  Todd Weaver, DOB August 20, 1950, MRN [id number]  Referring Doctor:  Marden Noble, MD   Cardiologist:   Wende Bushy, MD   Reason for consultation:  No chief complaint on file.  Follow-up for fatigue and bradycardia   History of Present Illness: Todd Weaver is a 65 y.o. male who presents for follow-up after medication adjustment  On his last visit, the plan was to reduce dose of atenolol. Since then, his medication was switched to metoprolol. He continues to have fatigue. He denies chest pain. He does mention shortness of breath now with minimal physical activity. This is mild to moderate in intensity, lasting minutes at a time, resolved with rest.  Patient reports that he usually only sleeps 3-4 hours at night. Because of this, he feels tired in the day and has to have one or 2 naps at work. He says that he is trying to sleep last during the daytime. Her continues to have poor sleep at night.  In terms of hypertension, he says that blood pressure is somewhat elevated compared to before. He is not sure why his PCP changes medication from atenolol to metoprolol.   ROS:  Please see the history of present illness. Aside from mentioned under HPI, all other systems are reviewed and negative.     Past Medical History:  Diagnosis Date  . GERD (gastroesophageal reflux disease)   . Hemorrhoid   . Hyperlipidemia   . Hypertension   . Stroke [facility]) 2011   "2 MINI STROKES"    Past Surgical History:  Procedure Laterality Date  . ANAL FISTULECTOMY N/A 12/21/2014   Procedure: FISTULECTOMY ANAL;  Surgeon: Christene Lye, MD;  Location: [facility] ORS;  Service: General;  Laterality: N/A;  . APPENDECTOMY  1975  . HEMORRHOID SURGERY N/A 12/21/2014   Procedure: HEMORRHOIDECTOMY;  Surgeon: Christene Lye, MD;  Location: [facility] ORS;  Service: General;  Laterality: N/A;  . KNEE SURGERY Right age 39  . ROTATOR CUFF REPAIR Right 2013       reports that he quit smoking about 17 years ago. He has never used smokeless tobacco. He reports that he drinks alcohol. He reports that he does not use drugs.   family history includes Hypertension in his father and mother.   Outpatient Medications Prior to Visit  Medication Sig Dispense Refill  . aspirin EC 81 MG tablet Take 81 mg by mouth once.     Marland Kitchen atenolol (TENORMIN) 100 MG tablet Take 100 mg by mouth every morning.   5  . atorvastatin (LIPITOR) 20 MG tablet Take 20 mg by mouth every morning.   5   No facility-administered medications prior to visit.      Allergies: Shellfish allergy    PHYSICAL EXAM: VS:  BP (!) 150/78 (BP Location: Left Arm, Patient Position: Sitting, Cuff Size: Normal)   Pulse (!) 58   Ht 5\' 7"  (1.702 m)   Wt 185 lb 6.4 oz (84.1 kg)   BMI 29.04 kg/m  , Body mass index is 29.04 kg/m. Wt Readings from Last 3 Encounters:  03/27/16 185 lb 6.4 oz (84.1 kg)  03/13/16 187 lb 11 oz (85.1 kg)  01/17/15 189 lb (85.7 kg)    GENERAL:  well developed, well nourished, not in acute distress HEENT: normocephalic, pink conjunctivae, anicteric sclerae, no xanthelasma, normal dentition, oropharynx clear NECK:  no neck vein engorgement, JVP normal, no hepatojugular reflux, carotid upstroke  brisk and symmetric, no bruit, no thyromegaly, no lymphadenopathy LUNGS:  good respiratory effort, clear to auscultation bilaterally CV:  PMI not displaced, no thrills, no lifts, S1 and S2 within normal limits, no palpable S3 or S4, no murmurs, no rubs, no gallops ABD:  Soft, nontender, nondistended, normoactive bowel sounds, no abdominal aortic bruit, no hepatomegaly, no splenomegaly MS: nontender back, no kyphosis, no scoliosis, no joint deformities EXT:  2+ DP/PT pulses, no edema, no varicosities, no cyanosis, no clubbing SKIN: warm, nondiaphoretic, normal turgor, no ulcers NEUROPSYCH: alert, oriented to person, place, and time, sensory/motor grossly intact, normal mood,  appropriate affect  Recent Labs: No results found for requested labs within last 8760 hours.   Lipid Panel No results found for: CHOL, TRIG, HDL, CHOLHDL, VLDL, LDLCALC, LDLDIRECT   Other studies Reviewed:  EKG:  The ekg from 03/13/2016 was personally reviewed by me and it revealed sinus bradycardia, 49 BPM.  Additional studies/ records that were reviewed personally reviewed by me today include: None available   ASSESSMENT AND PLAN:  Fatigue Shortness of breath Note of sinus bradycardia on EKG  Recommend to reduce his metoprolol XL 100 mg by mouth daily to 50 mg by mouth daily. Will add amlodipine 5 mg by mouth daily for better blood pressure control. Since patient feels that the shortness breath as bothersome, we will proceed with pharmacologic nuclear stress testing. Also will check TSH and free T4. Check echocardiogram as well. We also discussed importance of good sleep hygiene. Recommended that he try to avoid taking naps in the day so that he will likely sleep longer at night. Patient also has to follow up with PCP for this.   HTN Continue blood pressure log. BP: Is less than 140/80.   Current medicines are reviewed at length with the patient today.  The patient does not have concerns regarding medicines.  Labs/ tests ordered today include:  Orders Placed This Encounter  Procedures  . NM Myocar Multi W/Spect W/Wall Motion / EF  . TSH  . T4, free  . ECHOCARDIOGRAM COMPLETE    I had a lengthy and detailed discussion with the patient regarding diagnoses, prognosis, diagnostic options, treatment options , and side effects of medications.   I counseled the patient on importance of lifestyle modification including heart healthy diet, regular physical activity   Disposition:   FU with undersigned After tests   Signed, Wende Bushy, MD  03/27/2016 2:21 PM    [place]  This note was generated in part with voice recognition software and I  apologize for any typographical errors that were not detected and corrected.

## 2016-03-27 ENCOUNTER — Ambulatory Visit (INDEPENDENT_AMBULATORY_CARE_PROVIDER_SITE_OTHER): Payer: Managed Care, Other (non HMO) | Admitting: Cardiology

## 2016-03-27 ENCOUNTER — Encounter: Payer: Self-pay | Admitting: Cardiology

## 2016-03-27 VITALS — BP 150/78 | HR 58 | Ht 67.0 in | Wt 185.4 lb

## 2016-03-27 DIAGNOSIS — R001 Bradycardia, unspecified: Secondary | ICD-10-CM | POA: Diagnosis not present

## 2016-03-27 DIAGNOSIS — I10 Essential (primary) hypertension: Secondary | ICD-10-CM | POA: Diagnosis not present

## 2016-03-27 DIAGNOSIS — R5382 Chronic fatigue, unspecified: Secondary | ICD-10-CM

## 2016-03-27 DIAGNOSIS — R0602 Shortness of breath: Secondary | ICD-10-CM

## 2016-03-27 MED ORDER — AMLODIPINE BESYLATE 5 MG PO TABS: 5.0000 mg | ORAL_TABLET | Freq: Every day | ORAL | 6 refills | Status: DC

## 2016-03-27 NOTE — Patient Instructions (Addendum)
Medication Instructions:  Your physician has recommended you make the following change in your medication:  1. Make sure you are taking Metoprolol 50 mg once daily which would be 1/2 tablet of current pills. 2. Amlodipine 5 mg Once daily   Labwork: Labs done today were TSH and T4 evaluating your thyroid function.  Testing/Procedures: Your physician has requested that you have an echocardiogram. Echocardiography is a painless test that uses sound waves to create images of your heart. It provides your doctor with information about the size and shape of your heart and how well your heart's chambers and valves are working. This procedure takes approximately one hour. There are no restrictions for this procedure.  [place]  Your caregiver has ordered a Stress Test with nuclear imaging. The purpose of this test is to evaluate the blood supply to your heart muscle. This procedure is referred to as a "Non-Invasive Stress Test." This is because other than having an IV started in your vein, nothing is inserted or "invades" your body. Cardiac stress tests are done to find areas of poor blood flow to the heart by determining the extent of coronary artery disease (CAD). Some patients exercise on a treadmill, which naturally increases the blood flow to your heart, while others who are  unable to walk on a treadmill due to physical limitations have a pharmacologic/chemical stress agent called Lexiscan . This medicine will mimic walking on a treadmill by temporarily increasing your coronary blood flow.   Please note: these test may take anywhere between 2-4 hours to complete  PLEASE REPORT TO [place] AT THE FIRST DESK WILL DIRECT YOU WHERE TO GO  Date of Procedure:__Friday April 06, 2016 at 07:30AM__  Arrival Time for Procedure:__Arrive at 07:15AM to register____  Instructions regarding medication:   __X__:  Hold Metoprolol the morning of procedure   PLEASE  NOTIFY THE OFFICE AT LEAST 24 HOURS IN ADVANCE IF YOU ARE UNABLE TO KEEP YOUR APPOINTMENT.  [phone] AND  PLEASE NOTIFY NUCLEAR MEDICINE AT [facility] AT LEAST 24 HOURS IN ADVANCE IF YOU ARE UNABLE TO KEEP YOUR APPOINTMENT. [phone]  How to prepare for your Myoview test:  1. Do not eat or drink after midnight 2. No caffeine for 24 hours prior to test 3. No smoking 24 hours prior to test. 4. Your medication may be taken with water.  If your doctor stopped a medication because of this test, do not take that medication. 5. Ladies, please do not wear dresses.  Skirts or pants are appropriate. Please wear a short sleeve shirt. 6. No perfume, cologne or lotion. 7. Wear comfortable walking shoes. No heels!    Follow-Up: Your physician recommends that you schedule a follow-up appointment after testing with Dr. Yvone Neu.  It was a pleasure seeing you today here in the office. Please do not hesitate to give [place] a call back if you have any further questions. [place], BSN       Echocardiogram An echocardiogram, or echocardiography, uses sound waves (ultrasound) to produce an image of your heart. The echocardiogram is simple, painless, obtained within a short period of time, and offers valuable information to your health care provider. The images from an echocardiogram can provide information such as:  Evidence of coronary artery disease (CAD).  Heart size.  Heart muscle function.  Heart valve function.  Aneurysm detection.  Evidence of a past heart attack.  Fluid buildup around the heart.  Heart muscle thickening.  Assess heart valve function. LET [facility] CARE PROVIDER KNOW ABOUT:  Any allergies you have.  All medicines you are taking, including vitamins, herbs, eye drops, creams, and over-the-counter medicines.  Previous problems you or members of your family have had with the use of anesthetics.  Any blood disorders you have.  Previous surgeries  you have had.  Medical conditions you have.  Possibility of pregnancy, if this applies. BEFORE THE PROCEDURE  No special preparation is needed. Eat and drink normally.  PROCEDURE   In order to produce an image of your heart, gel will be applied to your chest and a wand-like tool (transducer) will be moved over your chest. The gel will help transmit the sound waves from the transducer. The sound waves will harmlessly bounce off your heart to allow the heart images to be captured in real-time motion. These images will then be recorded.  You may need an IV to receive a medicine that improves the quality of the pictures. AFTER THE PROCEDURE You may return to your normal schedule including diet, activities, and medicines, unless your health care provider tells you otherwise.   This information is not intended to replace advice given to you by your health care provider. Make sure you discuss any questions you have with your health care provider.   Document Released: 07/06/2000 Document Revised: 07/30/2014 Document Reviewed: 03/16/2013 Elsevier Interactive Patient Education 2016 [place].     Pharmacologic Stress Electrocardiogram A pharmacologic stress electrocardiogram is a heart (cardiac) test that uses nuclear imaging to evaluate the blood supply to your heart. This test may also be called a pharmacologic stress electrocardiography. Pharmacologic means that a medicine is used to increase your heart rate and blood pressure.  This stress test is done to find areas of poor blood flow to the heart by determining the extent of coronary artery disease (CAD). Some people exercise on a treadmill, which naturally increases the blood flow to the heart. For those people unable to exercise on a treadmill, a medicine is used. This medicine stimulates your heart and will cause your heart to beat harder and more quickly, as if you were exercising.  Pharmacologic stress tests can help determine:  The  adequacy of blood flow to your heart during increased levels of activity in order to clear you for discharge home.  The extent of coronary artery blockage caused by CAD.  Your prognosis if you have suffered a heart attack.  The effectiveness of cardiac procedures done, such as an angioplasty, which can increase the circulation in your coronary arteries.  Causes of chest pain or pressure. LET [facility] CARE PROVIDER KNOW ABOUT:  Any allergies you have.  All medicines you are taking, including vitamins, herbs, eye drops, creams, and over-the-counter medicines.  Previous problems you or members of your family have had with the use of anesthetics.  Any blood disorders you have.  Previous surgeries you have had.  Medical conditions you have.  Possibility of pregnancy, if this applies.  If you are currently breastfeeding. RISKS AND COMPLICATIONS Generally, this is a safe procedure. However, as with any procedure, complications can occur. Possible complications include:  You develop pain or pressure in the following areas:  Chest.  Jaw or neck.  Between your shoulder blades.  Radiating down your left arm.  Headache.  Dizziness or light-headedness.  Shortness of breath.  Increased or irregular heartbeat.  Low blood pressure.  Nausea or vomiting.  Flushing.  Redness going up the arm and slight pain  during injection of medicine.  Heart attack (rare). BEFORE THE PROCEDURE   Avoid all forms of caffeine for 24 hours before your test or as directed by your health care provider. This includes coffee, tea (even decaffeinated tea), caffeinated sodas, chocolate, cocoa, and certain pain medicines.  Follow your health care provider's instructions regarding eating and drinking before the test.  Take your medicines as directed at regular times with water unless instructed otherwise. Exceptions may include:  If you have diabetes, ask how you are to take your insulin or  pills. It is common to adjust insulin dosing the morning of the test.  If you are taking beta-blocker medicines, it is important to talk to your health care provider about these medicines well before the date of your test. Taking beta-blocker medicines may interfere with the test. In some cases, these medicines need to be changed or stopped 24 hours or more before the test.  If you wear a nitroglycerin patch, it may need to be removed prior to the test. Ask your health care provider if the patch should be removed before the test.  If you use an inhaler for any breathing condition, bring it with you to the test.  If you are an outpatient, bring a snack so you can eat right after the stress phase of the test.  Do not smoke for 4 hours prior to the test or as directed by your health care provider.  Do not apply lotions, powders, creams, or oils on your chest prior to the test.  Wear comfortable shoes and clothing. Let your health care provider know if you were unable to complete or follow the preparations for your test. PROCEDURE   Multiple patches (electrodes) will be put on your chest. If needed, small areas of your chest may be shaved to get better contact with the electrodes. Once the electrodes are attached to your body, multiple wires will be attached to the electrodes, and your heart rate will be monitored.  An IV access will be started. A nuclear trace (isotope) is given. The isotope may be given intravenously, or it may be swallowed. Nuclear refers to several types of radioactive isotopes, and the nuclear isotope lights up the arteries so that the nuclear images are clear. The isotope is absorbed by your body. This results in low radiation exposure.  A resting nuclear image is taken to show how your heart functions at rest.  A medicine is given through the IV access.  A second scan is done about 1 hour after the medicine injection and determines how your heart functions under  stress.  During this stress phase, you will be connected to an electrocardiogram machine. Your blood pressure and oxygen levels will be monitored. AFTER THE PROCEDURE   Your heart rate and blood pressure will be monitored after the test.  You may return to your normal schedule, including diet,activities, and medicines, unless your health care provider tells you otherwise.   This information is not intended to replace advice given to you by your health care provider. Make sure you discuss any questions you have with your health care provider.   Document Released: 11/25/2008 Document Revised: 07/14/2013 Document Reviewed: 03/16/2013 Elsevier Interactive Patient Education [employer].

## 2016-03-28 LAB — T4, FREE: FREE T4: 1.19 ng/dL (ref 0.82–1.77)

## 2016-03-28 LAB — TSH: TSH: 1.32 u[IU]/mL (ref 0.450–4.500)

## 2016-04-05 ENCOUNTER — Telehealth: Payer: Self-pay | Admitting: Cardiology

## 2016-04-05 NOTE — Telephone Encounter (Signed)
Reviewed instructions, time, and location for stress test scheduled tomorrow. He verbalized understanding with no further questions at this time.

## 2016-04-06 ENCOUNTER — Encounter
Admission: RE | Admit: 2016-04-06 | Discharge: 2016-04-06 | Disposition: A | Discharge status: Home / Self Care | Payer: Managed Care, Other (non HMO) | Source: Ambulatory Visit | Attending: Cardiology | Admitting: Cardiology

## 2016-04-06 DIAGNOSIS — R0602 Shortness of breath: Secondary | ICD-10-CM | POA: Insufficient documentation

## 2016-04-06 LAB — NM MYOCAR MULTI W/SPECT W/WALL MOTION / EF
CSEPHR: 47 %
CSEPPHR: 75 {beats}/min
LV sys vol: 70 mL
LVDIAVOL: 139 mL (ref 62–150)
Rest HR: 53 {beats}/min
SDS: 0
SRS: 0
SSS: 0
TID: 1.08

## 2016-04-06 MED ORDER — TECHNETIUM TC 99M TETROFOSMIN IV KIT
12.2400 | PACK | Freq: Once | INTRAVENOUS | Status: AC | PRN
  Administered 2016-04-06: 12.24 via INTRAVENOUS

## 2016-04-06 MED ORDER — REGADENOSON 0.4 MG/5ML IV SOLN
0.4000 mg | Freq: Once | INTRAVENOUS | Status: AC
  Administered 2016-04-06: 0.4 mg via INTRAVENOUS
  Filled 2016-04-06: qty 5

## 2016-04-06 MED ORDER — TECHNETIUM TC 99M TETROFOSMIN IV KIT
32.6700 | PACK | Freq: Once | INTRAVENOUS | Status: AC | PRN
  Administered 2016-04-06: 32.67 via INTRAVENOUS

## 2016-04-09 ENCOUNTER — Other Ambulatory Visit: Payer: Self-pay

## 2016-04-09 ENCOUNTER — Ambulatory Visit (INDEPENDENT_AMBULATORY_CARE_PROVIDER_SITE_OTHER): Payer: Managed Care, Other (non HMO)

## 2016-04-09 DIAGNOSIS — R0602 Shortness of breath: Secondary | ICD-10-CM

## 2016-04-10 NOTE — Progress Notes (Signed)
Cardiology Office Note   Date:  04/16/2016   ID:  Ivery, Hovsepian Oct 16, 1950, MRN [id number]  Referring Doctor:  Marden Noble, MD   Cardiologist:   Wende Bushy, MD   Reason for consultation:  Chief Complaint  Patient presents with  . Follow-up    f/u to testing at [facility]   Follow-up for fatigue and bradycardia   History of Present Illness: Todd Weaver is a 65 y.o. male who presents for follow-up after medication adjustment  On his last visit, the plan was to reduce dose of atenolol. Since then, his medication was switched to metoprolol. Currently, atenolol and metoprolol are both listed. He is not sure which one he is actually taking.   Patient has noticed less daytime fatigue. He has not really naps at work recently. He continues to struggle with poor sleep habits at night.   In terms of hypertension, blood pressure has been unremarkable and within normal limits.  ROS:  Please see the history of present illness. Aside from mentioned under HPI, all other systems are reviewed and negative.     Past Medical History:  Diagnosis Date  . GERD (gastroesophageal reflux disease)   . Hemorrhoid   . Hyperlipidemia   . Hypertension   . Stroke [facility]) 2011   "2 MINI STROKES"    Past Surgical History:  Procedure Laterality Date  . ANAL FISTULECTOMY N/A 12/21/2014   Procedure: FISTULECTOMY ANAL;  Surgeon: Christene Lye, MD;  Location: [facility] ORS;  Service: General;  Laterality: N/A;  . APPENDECTOMY  1975  . HEMORRHOID SURGERY N/A 12/21/2014   Procedure: HEMORRHOIDECTOMY;  Surgeon: Christene Lye, MD;  Location: [facility] ORS;  Service: General;  Laterality: N/A;  . KNEE SURGERY Right age 58  . ROTATOR CUFF REPAIR Right 2013     reports that he quit smoking about 17 years ago. He has never used smokeless tobacco. He reports that he drinks alcohol. He reports that he does not use drugs.   family history includes Hypertension in his father and mother.    Outpatient Medications Prior to Visit  Medication Sig Dispense Refill  . amLODipine (NORVASC) 5 MG tablet Take 1 tablet (5 mg total) by mouth daily. 30 tablet 6  . aspirin EC 81 MG tablet Take 81 mg by mouth once.     Marland Kitchen atenolol (TENORMIN) 100 MG tablet Take 100 mg by mouth every morning.   5  . atorvastatin (LIPITOR) 20 MG tablet Take 20 mg by mouth every morning.   5  . metoprolol succinate (TOPROL-XL) 100 MG 24 hr tablet Take 50 mg by mouth daily. Take with or immediately following a meal.     No facility-administered medications prior to visit.      Allergies: Shellfish allergy    PHYSICAL EXAM: VS:  BP 130/80   Pulse (!) 51   Ht 5\' 7"  (1.702 m)   Wt 185 lb 12.8 oz (84.3 kg)   SpO2 95%   BMI 29.10 kg/m  , Body mass index is 29.1 kg/m. Wt Readings from Last 3 Encounters:  04/16/16 185 lb 12.8 oz (84.3 kg)  03/27/16 185 lb 6.4 oz (84.1 kg)  03/13/16 187 lb 11 oz (85.1 kg)    GENERAL:  well developed, well nourished, not in acute distress HEENT: normocephalic, pink conjunctivae, anicteric sclerae, no xanthelasma, normal dentition, oropharynx clear NECK:  no neck vein engorgement, JVP normal, no hepatojugular reflux, carotid upstroke brisk and symmetric, no bruit, no thyromegaly,  no lymphadenopathy LUNGS:  good respiratory effort, clear to auscultation bilaterally CV:  PMI not displaced, no thrills, no lifts, S1 and S2 within normal limits, no palpable S3 or S4, no murmurs, no rubs, no gallops ABD:  Soft, nontender, nondistended, normoactive bowel sounds, no abdominal aortic bruit, no hepatomegaly, no splenomegaly MS: nontender back, no kyphosis, no scoliosis, no joint deformities EXT:  2+ DP/PT pulses, no edema, no varicosities, no cyanosis, no clubbing SKIN: warm, nondiaphoretic, normal turgor, no ulcers NEUROPSYCH: alert, oriented to person, place, and time, sensory/motor grossly intact, normal mood, appropriate affect  Recent Labs: 03/27/2016: TSH 1.320   Lipid  Panel No results found for: CHOL, TRIG, HDL, CHOLHDL, VLDL, LDLCALC, LDLDIRECT   Other studies Reviewed:  EKG:  The ekg from 03/13/2016 was personally reviewed by me and it revealed sinus bradycardia, 49 BPM.  Additional studies/ records that were reviewed personally reviewed by me today include:  Echo 04/09/2016: Left ventricle: The cavity size was normal. Wall thickness was   normal. Systolic function was normal. The estimated ejection   fraction was in the range of 55% to 60%. Wall motion was normal;   there were no regional wall motion abnormalities. Left   ventricular diastolic function parameters were normal.  Impressions:  - Normal study.  Nuclear stress test 04/06/2016:  There was no ST segment deviation noted during stress.  No T wave inversion was noted during stress.  This is a low risk study.  The left ventricular ejection fraction is normal (55-65%).  Defect 1: There is a small defect of mild severity present in the apex location. This is patrially reversible and suspected to be due to an artifact given intense GI uptake which affected the quality of study. Correlate clinically   ASSESSMENT AND PLAN:  Fatigue Shortness of breath Note of sinus bradycardia on EKG  Patient to inform our office when he gets home which one he actually takes: Atenolol or metoprolol. The goal is to continue to wean him off of beta blocker.  Continue amlodipine 5 mg by mouth daily for better blood pressure control. Nuclear stress this shows a low risk study. The partially reversible apical defect is likely related to GI uptake. Patient does not have any chest pain. TSH and free T4 within normal limits EF is within normal limits. We also discussed importance of good sleep hygiene. Recommended that he try to avoid taking naps in the day so that he will likely sleep longer at night. Also recommend to avoid drinking a lot of fluids as bedtime so as to avoid waking up at night to go to  the bathroom to PCP. Patient also has to follow up with PCP for this.   HTN BP is well controlled. Continue monitoring BP. Continue current medical therapy and lifestyle changes.  Current medicines are reviewed at length with the patient today.  The patient does not have concerns regarding medicines.  Labs/ tests ordered today include:  No orders of the defined types were placed in this encounter.   I had a lengthy and detailed discussion with the patient regarding diagnoses, prognosis, diagnostic options, treatment options , and side effects of medications.   I counseled the patient on importance of lifestyle modification including heart healthy diet, regular physical activity   Disposition:   FU with undersigned 67months   Signed, Wende Bushy, MD  04/16/2016 4:41 PM    [place]  This note was generated in part with voice recognition software and I apologize  for any typographical errors that were not detected and corrected.

## 2016-04-16 ENCOUNTER — Encounter: Payer: Self-pay | Admitting: Cardiology

## 2016-04-16 ENCOUNTER — Ambulatory Visit (INDEPENDENT_AMBULATORY_CARE_PROVIDER_SITE_OTHER): Payer: Managed Care, Other (non HMO) | Admitting: Cardiology

## 2016-04-16 VITALS — BP 130/80 | HR 51 | Ht 67.0 in | Wt 185.8 lb

## 2016-04-16 DIAGNOSIS — R001 Bradycardia, unspecified: Secondary | ICD-10-CM

## 2016-04-16 DIAGNOSIS — R5382 Chronic fatigue, unspecified: Secondary | ICD-10-CM

## 2016-04-16 DIAGNOSIS — I10 Essential (primary) hypertension: Secondary | ICD-10-CM | POA: Diagnosis not present

## 2016-04-16 DIAGNOSIS — Z09 Encounter for follow-up examination after completed treatment for conditions other than malignant neoplasm: Secondary | ICD-10-CM

## 2016-04-16 NOTE — Patient Instructions (Signed)
Medication Instructions:  Please call us and let us know which medications you are on.  Your physician has requested that you regularly monitor and record your blood pressure readings at home. Please use the same machine at the same time of day to check your readings and record them to bring to your follow-up visit.  Make sure to call if blood pressures remain greater than 140/80.  Follow-Up: Your physician recommends that you schedule a follow-up appointment in: 3 months with Dr. Yvone Neu.  It was a pleasure seeing you today here in the office. Please do not hesitate to give [place] a call back if you have any further questions. [place], BSN

## 2016-04-17 ENCOUNTER — Telehealth: Payer: Self-pay | Admitting: Cardiology

## 2016-04-17 ENCOUNTER — Encounter: Payer: Self-pay | Admitting: Cardiology

## 2016-04-17 NOTE — Telephone Encounter (Signed)
Patient called in and left voicemail message that he was taking norvasc, aspirin, and tenormin. He did not leave dosages or frequency. Called and left detailed voicemail message to please call back so we can discuss dosages and frequency with number to call.

## 2016-04-17 NOTE — Telephone Encounter (Signed)
Please advise patient to decrease Tenormin to half a tablet daily = 50mg  qd. Continue monitoring blood pressure. If above 140/80, to let us know. At that time we will likely increase his Norvasc to 10 mg daily. The goal is to wean him off of beta blockers.

## 2016-04-17 NOTE — Telephone Encounter (Signed)
Patient called back in and confirmed his current medications. He is on Norvasc 5 mg Once daily, Aspirin 81 mg Once daily, and Tenormin 100 mg Once daily. Let him know that I would make Dr. Yvone Neu aware. He had no further questions at this time.

## 2016-04-17 NOTE — Telephone Encounter (Signed)
Left voicemail message for patient to call back.

## 2016-04-18 NOTE — Telephone Encounter (Signed)
Left detailed message for patient to call back and discuss medication changes.

## 2016-04-18 NOTE — Telephone Encounter (Signed)
Spoke with [name] and reviewed Dr. Tora Kindred recommendations for medication changes. Instructed him to take Atenolol (Tenormin) 100 mg tablet 1/2 tablet once daily which would be 50 mg once daily. Also discussed that he should continue monitoring his blood pressures and to call if they remain greater than 140/80. He verbalized understanding and had no further questions at this time.

## 2016-06-26 ENCOUNTER — Ambulatory Visit (INDEPENDENT_AMBULATORY_CARE_PROVIDER_SITE_OTHER): Payer: Managed Care, Other (non HMO) | Admitting: Cardiology

## 2016-06-26 ENCOUNTER — Encounter: Payer: Self-pay | Admitting: Cardiology

## 2016-06-26 VITALS — BP 138/86 | HR 51 | Ht 67.0 in | Wt 188.0 lb

## 2016-06-26 DIAGNOSIS — R5382 Chronic fatigue, unspecified: Secondary | ICD-10-CM

## 2016-06-26 DIAGNOSIS — R001 Bradycardia, unspecified: Secondary | ICD-10-CM | POA: Diagnosis not present

## 2016-06-26 DIAGNOSIS — I10 Essential (primary) hypertension: Secondary | ICD-10-CM

## 2016-06-26 MED ORDER — AMLODIPINE BESYLATE 5 MG PO TABS: 5.0000 mg | ORAL_TABLET | Freq: Every day | ORAL | 3 refills | Status: DC

## 2016-06-26 NOTE — Progress Notes (Signed)
Cardiology Office Note   Date:  06/26/2016   ID:  Todd Weaver, DOB Oct 03, 1950, MRN [id number]  Referring Doctor:  Marden Noble, MD   Cardiologist:   Wende Bushy, MD   Reason for consultation:  Chief Complaint  Patient presents with  . other    13mo f/u. Pt states he is doing well. Reviewed meds with pt verbally.   Follow-up for fatigue and bradycardia   History of Present Illness: Todd Weaver is a 65 y.o. male who presents for follow-up after medication adjustment  There was a lot of confusion about the medications patient was taking on his last visit. He brings his bowel vitals today and he reports that he is taking metoprolol ER 100 mg 1/4 tablet daily. He has not been taking atenolol. Since taking the lower dose of the metoprolol, he has noted improvement in his fatigue.  In terms of hypertension, blood pressure has been overall okay, with occasional blood pressure measurements in the 0000000 and [id number] systolic.   ROS:  Please see the history of present illness. Aside from mentioned under HPI, all other systems are reviewed and negative.     Past Medical History:  Diagnosis Date  . GERD (gastroesophageal reflux disease)   . Hemorrhoid   . Hyperlipidemia   . Hypertension   . Stroke [facility]) 2011   "2 MINI STROKES"    Past Surgical History:  Procedure Laterality Date  . ANAL FISTULECTOMY N/A 12/21/2014   Procedure: FISTULECTOMY ANAL;  Surgeon: Christene Lye, MD;  Location: [facility] ORS;  Service: General;  Laterality: N/A;  . APPENDECTOMY  1975  . HEMORRHOID SURGERY N/A 12/21/2014   Procedure: HEMORRHOIDECTOMY;  Surgeon: Christene Lye, MD;  Location: [facility] ORS;  Service: General;  Laterality: N/A;  . KNEE SURGERY Right age 56  . ROTATOR CUFF REPAIR Right 2013     reports that he quit smoking about 17 years ago. He has never used smokeless tobacco. He reports that he drinks alcohol. He reports that he does not use drugs.   family history includes  Hypertension in his father and mother.   Outpatient Medications Prior to Visit  Medication Sig Dispense Refill  . aspirin EC 81 MG tablet Take 81 mg by mouth once.     Marland Kitchen atenolol (TENORMIN) 100 MG tablet Take 100 mg by mouth every morning.   5  . amLODipine (NORVASC) 5 MG tablet Take 1 tablet (5 mg total) by mouth daily. 30 tablet 6   No facility-administered medications prior to visit.      Allergies: Shellfish allergy    PHYSICAL EXAM: VS:  BP 138/86 (BP Location: Left Arm, Patient Position: Sitting, Cuff Size: Normal)   Pulse (!) 51   Ht 5\' 7"  (1.702 m)   Wt 188 lb (85.3 kg)   BMI 29.44 kg/m  , Body mass index is 29.44 kg/m. Wt Readings from Last 3 Encounters:  06/26/16 188 lb (85.3 kg)  04/16/16 185 lb 12.8 oz (84.3 kg)  03/27/16 185 lb 6.4 oz (84.1 kg)    GENERAL:  well developed, well nourished, not in acute distress HEENT: normocephalic, pink conjunctivae, anicteric sclerae, no xanthelasma, normal dentition, oropharynx clear NECK:  no neck vein engorgement, JVP normal, no hepatojugular reflux, carotid upstroke brisk and symmetric, no bruit, no thyromegaly, no lymphadenopathy LUNGS:  good respiratory effort, clear to auscultation bilaterally CV:  PMI not displaced, no thrills, no lifts, S1 and S2 within normal limits, no palpable S3 or  S4, no murmurs, no rubs, no gallops ABD:  Soft, nontender, nondistended, normoactive bowel sounds, no abdominal aortic bruit, no hepatomegaly, no splenomegaly MS: nontender back, no kyphosis, no scoliosis, no joint deformities EXT:  2+ DP/PT pulses, no edema, no varicosities, no cyanosis, no clubbing SKIN: warm, nondiaphoretic, normal turgor, no ulcers NEUROPSYCH: alert, oriented to person, place, and time, sensory/motor grossly intact, normal mood, appropriate affect  Recent Labs: 03/27/2016: TSH 1.320   Lipid Panel No results found for: CHOL, TRIG, HDL, CHOLHDL, VLDL, LDLCALC, LDLDIRECT   Other studies Reviewed:  EKG:  The ekg from  03/13/2016 was personally reviewed by me and it revealed sinus bradycardia, 49 BPM.  Additional studies/ records that were reviewed personally reviewed by me today include:  Echo 04/09/2016: Left ventricle: The cavity size was normal. Wall thickness was   normal. Systolic function was normal. The estimated ejection   fraction was in the range of 55% to 60%. Wall motion was normal;   there were no regional wall motion abnormalities. Left   ventricular diastolic function parameters were normal.  Impressions:  - Normal study.  Nuclear stress test 04/06/2016:  There was no ST segment deviation noted during stress.  No T wave inversion was noted during stress.  This is a low risk study.  The left ventricular ejection fraction is normal (55-65%).  Defect 1: There is a small defect of mild severity present in the apex location. This is patrially reversible and suspected to be due to an artifact given intense GI uptake which affected the quality of study. Correlate clinically   ASSESSMENT AND PLAN:  Fatigue Shortness of breath Note of sinus bradycardia on EKG  Finally clarified his medication list. Recommend to discontinue metoprolol succinate altogether. He was only taking a total of 25 mg once a day at this point. Nuclear stress this shows a low risk study. The partially reversible apical defect is likely related to GI uptake. Patient does not have any chest pain. TSH and free T4 within normal limits EF is within normal limits. We also discussed importance of good sleep hygiene. Recommended that he try to avoid taking naps in the day so that he will likely sleep longer at night. Also recommend to avoid drinking a lot of fluids as bedtime so as to avoid waking up at night to go to the bathroom to PCP. Patient also has to follow up with PCP for this.   HTN Since patient will be taken off metoprolol, recommend to start amlodipine again at 5 mg once a day. Blood pressure monitoring.     Current medicines are reviewed at length with the patient today.  The patient does not have concerns regarding medicines.  Labs/ tests ordered today include:  Orders Placed This Encounter  Procedures  . EKG 12-Lead    I had a lengthy and detailed discussion with the patient regarding diagnoses, prognosis, diagnostic options, treatment options , and side effects of medications.   I counseled the patient on importance of lifestyle modification including heart healthy diet, regular physical activity   Disposition:   FU with undersigned 32months   Signed, Wende Bushy, MD  06/26/2016 5:15 PM    [place]  This note was generated in part with voice recognition software and I apologize for any typographical errors that were not detected and corrected.

## 2016-06-26 NOTE — Patient Instructions (Addendum)
Medication Instructions:  Please STOP Metoprolol  Please START amlodipine 5 mg once daily Please monitor your blood pressure at home and call if readings remain > 140/80  Labwork: None  Testing/Procedures: None  Follow-Up: 3 months  If you need a refill on your cardiac medications before your next appointment, please call your pharmacy.   Hypertension Hypertension is another name for high blood pressure. High blood pressure forces your heart to work harder to pump blood. A blood pressure reading has two numbers, which includes a higher number over a lower number (example: 110/72). Follow these instructions at home:  Have your blood pressure rechecked by your doctor.  Only take medicine as told by your doctor. Follow the directions carefully. The medicine does not work as well if you skip doses. Skipping doses also puts you at risk for problems.  Do not smoke.  Monitor your blood pressure at home as told by your doctor. Contact a doctor if:  You think you are having a reaction to the medicine you are taking.  You have repeat headaches or feel dizzy.  You have puffiness (swelling) in your ankles.  You have trouble with your vision. Get help right away if:  You get a very bad headache and are confused.  You feel weak, numb, or faint.  You get chest or belly (abdominal) pain.  You throw up (vomit).  You cannot breathe very well. This information is not intended to replace advice given to you by your health care provider. Make sure you discuss any questions you have with your health care provider. Document Released: 12/26/2007 Document Revised: 12/15/2015 Document Reviewed: 05/01/2013 Elsevier Interactive Patient Education  2017 [employer].

## 2016-06-27 ENCOUNTER — Telehealth: Payer: Self-pay

## 2016-06-27 NOTE — Telephone Encounter (Signed)
Left msg for pt to call and schedule 3 month f/u

## 2016-06-27 NOTE — Telephone Encounter (Signed)
-----   Message from Valora Corporal, RN sent at 06/26/2016  5:32 PM EST ----- Please schedule follow up for 3 months. Thanks so much

## 2016-07-23 HISTORY — PX: POSTERIOR LAMINECTOMY / DECOMPRESSION LUMBAR SPINE: SUR740

## 2016-09-25 ENCOUNTER — Ambulatory Visit (INDEPENDENT_AMBULATORY_CARE_PROVIDER_SITE_OTHER): Payer: 59 | Admitting: Cardiology

## 2016-09-25 ENCOUNTER — Encounter: Payer: Self-pay | Admitting: Cardiology

## 2016-09-25 VITALS — BP 124/78 | HR 65 | Ht 67.0 in | Wt 186.8 lb

## 2016-09-25 DIAGNOSIS — R001 Bradycardia, unspecified: Secondary | ICD-10-CM | POA: Diagnosis not present

## 2016-09-25 DIAGNOSIS — I10 Essential (primary) hypertension: Secondary | ICD-10-CM | POA: Diagnosis not present

## 2016-09-25 NOTE — Progress Notes (Signed)
Cardiology Office Note   Date:  09/25/2016   ID:  Todd Weaver, DOB 01/11/1951, MRN [id number]  Referring Doctor:  Todd Noble, MD   Cardiologist:   Todd Bushy, MD   Reason for consultation:  Chief Complaint  Patient presents with  . OTHER    3 month f/u no complaints today. Meds reviewed verbally with pt.      History of Present Illness: Todd Weaver is a 66 y.o. male who presents for follow-up For bradycardia and hypertension   Since last visit, patient has been doing overall well. He has been off of the metoprolol. He has been taking amlodipine. Blood pressure has been in the normal range.   Patient denies chest pain, shortness of breath. No palpitations.   ROS:  Please see the history of present illness. Aside from mentioned under HPI, all other systems are reviewed and negative.    Past Medical History:  Diagnosis Date  . GERD (gastroesophageal reflux disease)   . Hemorrhoid   . Hyperlipidemia   . Hypertension   . Stroke [facility]) 2011   "2 MINI STROKES"    Past Surgical History:  Procedure Laterality Date  . ANAL FISTULECTOMY N/A 12/21/2014   Procedure: FISTULECTOMY ANAL;  Surgeon: Todd Lye, MD;  Location: [facility] ORS;  Service: General;  Laterality: N/A;  . APPENDECTOMY  1975  . HEMORRHOID SURGERY N/A 12/21/2014   Procedure: HEMORRHOIDECTOMY;  Surgeon: Todd Lye, MD;  Location: [facility] ORS;  Service: General;  Laterality: N/A;  . KNEE SURGERY Right age 32  . ROTATOR CUFF REPAIR Right 2013     reports that he quit smoking about 18 years ago. He has never used smokeless tobacco. He reports that he drinks alcohol. He reports that he does not use drugs.   family history includes Hypertension in his father and mother.   Outpatient Medications Prior to Visit  Medication Sig Dispense Refill  . amLODipine (NORVASC) 5 MG tablet Take 1 tablet (5 mg total) by mouth daily. 180 tablet 3  . aspirin EC 81 MG tablet Take 81 mg by mouth once.      Marland Kitchen atorvastatin (LIPITOR) 20 MG tablet Take 20 mg by mouth daily.     No facility-administered medications prior to visit.      Allergies: Shellfish allergy    PHYSICAL EXAM: VS:  BP 124/78 (BP Location: Left Arm, Patient Position: Sitting, Cuff Size: Normal)   Pulse 65   Ht 5\' 7"  (1.702 m)   Wt 186 lb 12 oz (84.7 kg)   BMI 29.25 kg/m  , Body mass index is 29.25 kg/m. Wt Readings from Last 3 Encounters:  09/25/16 186 lb 12 oz (84.7 kg)  06/26/16 188 lb (85.3 kg)  04/16/16 185 lb 12.8 oz (84.3 kg)    GENERAL:  well developed, well nourished, not in acute distress HEENT: normocephalic, pink conjunctivae, anicteric sclerae, no xanthelasma, normal dentition, oropharynx clear NECK:  no neck vein engorgement, JVP normal, no hepatojugular reflux, carotid upstroke brisk and symmetric, no bruit, no thyromegaly, no lymphadenopathy LUNGS:  good respiratory effort, clear to auscultation bilaterally CV:  PMI not displaced, no thrills, no lifts, S1 and S2 within normal limits, no palpable S3 or S4, no murmurs, no rubs, no gallops ABD:  Soft, nontender, nondistended, normoactive bowel sounds, no abdominal aortic bruit, no hepatomegaly, no splenomegaly MS: nontender back, no kyphosis, no scoliosis, no joint deformities EXT:  2+ DP/PT pulses, no edema, no varicosities, no cyanosis, no  clubbing SKIN: warm, nondiaphoretic, normal turgor, no ulcers NEUROPSYCH: alert, oriented to person, place, and time, sensory/motor grossly intact, normal mood, appropriate affect   Recent Labs: 03/27/2016: TSH 1.320   Lipid Panel No results found for: CHOL, TRIG, HDL, CHOLHDL, VLDL, LDLCALC, LDLDIRECT   Other studies Reviewed:  EKG:  The ekg from 03/13/2016 was personally reviewed by me and it revealed sinus bradycardia, 49 BPM.  Additional studies/ records that were reviewed personally reviewed by me today include:  Echo 04/09/2016: Left ventricle: The cavity size was normal. Wall thickness was   normal.  Systolic function was normal. The estimated ejection   fraction was in the range of 55% to 60%. Wall motion was normal;   there were no regional wall motion abnormalities. Left   ventricular diastolic function parameters were normal.  Impressions:  - Normal study.  Nuclear stress test 04/06/2016:  There was no ST segment deviation noted during stress.  No T wave inversion was noted during stress.  This is a low risk study.  The left ventricular ejection fraction is normal (55-65%).  Defect 1: There is a small defect of mild severity present in the apex location. This is patrially reversible and suspected to be due to an artifact given intense GI uptake which affected the quality of study. Correlate clinically   ASSESSMENT AND PLAN:  Fatigue Shortness of breath Note of sinus bradycardia on EKG  Recommendations the same as from last visit 06/26/2016: Bradycardia has resolved since discontinuation of all beta blockers. Nuclear stress this shows a low risk study. The partially reversible apical defect is likely related to GI uptake. Patient does not have any chest pain. TSH and free T4 within normal limits EF is within normal limits. We also discussed importance of good sleep hygiene. Recommended that he try to avoid taking naps in the day so that he will likely sleep longer at night. Also recommend to avoid drinking a lot of fluids as bedtime so as to avoid waking up at night to go to the bathroom to PCP. Patient also has to follow up with PCP for this.   HTN Blood pressure stable on amlodipine 5 mg by mouth daily. Continue blood pressure log.  Current medicines are reviewed at length with the patient today.  The patient does not have concerns regarding medicines.  Labs/ tests ordered today include:  Orders Placed This Encounter  Procedures  . EKG 12-Lead    I had a lengthy and detailed discussion with the patient regarding diagnoses, prognosis, diagnostic options,  treatment options , and side effects of medications.   I counseled the patient on importance of lifestyle modification including heart healthy diet, regular physical activity   Disposition:   FU with cardiology as needed Signed, Todd Bushy, MD  09/25/2016 4:41 PM    [place]  This note was generated in part with voice recognition software and I apologize for any typographical errors that were not detected and corrected.

## 2016-09-25 NOTE — Patient Instructions (Signed)
Follow-Up: Your physician recommends that you schedule a follow-up appointment as needed.   It was a pleasure seeing you today here in the office. Please do not hesitate to give us a call back if you have any further questions. [phone]  Aymara Sassi A. RN, BSN    

## 2016-11-08 DIAGNOSIS — M4716 Other spondylosis with myelopathy, lumbar region: Secondary | ICD-10-CM

## 2016-11-08 HISTORY — DX: Other spondylosis with myelopathy, lumbar region: M47.16

## 2017-06-19 ENCOUNTER — Encounter: Payer: Self-pay | Admitting: Family Medicine

## 2017-06-19 ENCOUNTER — Ambulatory Visit: Payer: 59 | Admitting: Family Medicine

## 2017-06-19 ENCOUNTER — Other Ambulatory Visit: Payer: Self-pay | Admitting: Family Medicine

## 2017-06-19 VITALS — BP 148/84 | HR 71 | Ht 67.0 in | Wt 188.0 lb

## 2017-06-19 DIAGNOSIS — N529 Male erectile dysfunction, unspecified: Secondary | ICD-10-CM | POA: Diagnosis not present

## 2017-06-19 DIAGNOSIS — Z820 Family history of epilepsy and other diseases of the nervous system: Secondary | ICD-10-CM

## 2017-06-19 DIAGNOSIS — G3184 Mild cognitive impairment, so stated: Secondary | ICD-10-CM

## 2017-06-19 DIAGNOSIS — Z8673 Personal history of transient ischemic attack (TIA), and cerebral infarction without residual deficits: Secondary | ICD-10-CM | POA: Diagnosis not present

## 2017-06-19 DIAGNOSIS — I10 Essential (primary) hypertension: Secondary | ICD-10-CM | POA: Diagnosis not present

## 2017-06-19 DIAGNOSIS — E663 Overweight: Secondary | ICD-10-CM

## 2017-06-19 DIAGNOSIS — R351 Nocturia: Secondary | ICD-10-CM

## 2017-06-19 DIAGNOSIS — N400 Enlarged prostate without lower urinary tract symptoms: Secondary | ICD-10-CM | POA: Insufficient documentation

## 2017-06-19 DIAGNOSIS — E782 Mixed hyperlipidemia: Secondary | ICD-10-CM | POA: Insufficient documentation

## 2017-06-19 DIAGNOSIS — E785 Hyperlipidemia, unspecified: Secondary | ICD-10-CM | POA: Diagnosis not present

## 2017-06-19 DIAGNOSIS — N401 Enlarged prostate with lower urinary tract symptoms: Secondary | ICD-10-CM

## 2017-06-19 DIAGNOSIS — Z23 Encounter for immunization: Secondary | ICD-10-CM | POA: Diagnosis not present

## 2017-06-19 HISTORY — DX: Essential (primary) hypertension: I10

## 2017-06-19 HISTORY — DX: Benign prostatic hyperplasia without lower urinary tract symptoms: N40.0

## 2017-06-19 HISTORY — DX: Overweight: E66.3

## 2017-06-19 HISTORY — DX: Personal history of transient ischemic attack (TIA), and cerebral infarction without residual deficits: Z86.73

## 2017-06-19 HISTORY — DX: Male erectile dysfunction, unspecified: N52.9

## 2017-06-19 HISTORY — DX: Mild cognitive impairment of uncertain or unknown etiology: G31.84

## 2017-06-19 HISTORY — DX: Mixed hyperlipidemia: E78.2

## 2017-06-19 MED ORDER — TAMSULOSIN HCL 0.4 MG PO CAPS: 0.4000 mg | ORAL_CAPSULE | Freq: Every day | ORAL | 2 refills | Status: DC

## 2017-06-19 NOTE — Progress Notes (Signed)
Date:  06/19/2017   Name:  Todd Weaver   DOB:  [birth year]   MRN:  [id number]  PCP:  Adline Potter, MD    Chief Complaint: Establish Care   History of Present Illness:  This is a 66 y.o. male seen for initial visit. HTN on amlodipine, well controlled in past. HLD on Lipitor. Takes asa for hx "mild strokes" 8-10 yrs ago. Had back surgery in May, helped a lot. C/o nocturia x 3 and ED past year, used Viagra in past but too expensive. Some concern re: memory but works full-time. Father died Alz 69, mother died Alz 70, sibs healthy. No recent imms, colonoscopy 5 yrs ago normal.  Review of Systems:  Review of Systems  Constitutional: Negative for chills and fever.  HENT: Negative for ear pain, sinus pain and sore throat.   Eyes: Negative for pain.  Respiratory: Negative for cough and shortness of breath.   Cardiovascular: Negative for chest pain and leg swelling.  Gastrointestinal: Negative for abdominal pain, constipation and diarrhea.  Genitourinary: Negative for difficulty urinating and dysuria.  Musculoskeletal: Negative for joint swelling.  Neurological: Negative for syncope and light-headedness.  Hematological: Negative for adenopathy.    Patient Active Problem List   Diagnosis Date Noted  . Hypertension 06/19/2017  . Hyperlipidemia 06/19/2017  . History of CVA (cerebrovascular accident) 06/19/2017  . Overweight (BMI 25.0-29.9) 06/19/2017  . BPH (benign prostatic hyperplasia) 06/19/2017  . ED (erectile dysfunction) 06/19/2017  . MCI (mild cognitive impairment) 06/19/2017  . FH: Alzheimer's disease 06/19/2017  . Lumbar spondylosis with myelopathy 11/08/2016  . Hand dermatitis 07/26/2014    Prior to Admission medications   Medication Sig Start Date End Date Taking? Authorizing Provider  amLODipine (NORVASC) 5 MG tablet Take 1 tablet (5 mg total) by mouth daily. 06/26/16 06/19/17 Yes Wende Bushy, MD  aspirin EC 81 MG tablet Take 81 mg by mouth once.  05/23/09  Yes  [provider]  atorvastatin (LIPITOR) 20 MG tablet Take 20 mg by mouth daily. 06/24/16  Yes [provider]  tamsulosin (FLOMAX) 0.4 MG CAPS capsule Take 1 capsule (0.4 mg total) by mouth daily. 06/19/17   Adline Potter, MD    Allergies  Allergen Reactions  . Shellfish Allergy Hives    swelling  . Codeine     Past Surgical History:  Procedure Laterality Date  . ANAL FISTULECTOMY N/A 12/21/2014   Procedure: FISTULECTOMY ANAL;  Surgeon: Christene Lye, MD;  Location: [facility] ORS;  Service: General;  Laterality: N/A;  . APPENDECTOMY  1975  . HEMORRHOID SURGERY N/A 12/21/2014   Procedure: HEMORRHOIDECTOMY;  Surgeon: Christene Lye, MD;  Location: [facility] ORS;  Service: General;  Laterality: N/A;  . KNEE SURGERY Right age 9  . ROTATOR CUFF REPAIR Right 2013    Social History   Tobacco Use  . Smoking status: Former Smoker    Last attempt to quit: 07/23/1998    Years since quitting: 18.9  . Smokeless tobacco: Never Used  Substance Use Topics  . Alcohol use: Yes    Alcohol/week: 0.0 oz    Comment: OCC  . Drug use: No    Family History  Problem Relation Age of Onset  . Hypertension Mother   . Hypertension Father     Medication list has been reviewed and updated.  Physical Examination: BP (!) 148/84   Pulse 71   Ht 5\' 7"  (1.702 m)   Wt 188 lb (85.3 kg)   SpO2 97%   BMI 29.44  kg/m   Physical Exam  Constitutional: He appears well-developed and well-nourished.  HENT:  Head: Normocephalic and atraumatic.  Right Ear: External ear normal.  Left Ear: External ear normal.  Nose: Nose normal.  Mouth/Throat: Oropharynx is clear and moist.  TMs obscured by cerumen  Eyes: Conjunctivae and EOM are normal. Pupils are equal, round, and reactive to light.  Neck: Neck supple. No thyromegaly present.  Cardiovascular: Normal rate, regular rhythm, normal heart sounds and intact distal pulses.  Pulmonary/Chest: Effort normal and breath sounds normal.   Abdominal: Soft. He exhibits no distension and no mass. There is no tenderness.  Genitourinary: Rectum normal.  Genitourinary Comments: Prostate 2+ symmetric no nodules  Musculoskeletal: He exhibits no edema.  Lymphadenopathy:    He has no cervical adenopathy.  Neurological: He is alert.  SLUMS 20/30 (high school education)  Skin: Skin is warm and dry.  Psychiatric: He has a normal mood and affect. His behavior is normal.  Nursing note and vitals reviewed.   Assessment and Plan:  1. Essential hypertension Marginal control on amlodipine, recheck next visit - Comprehensive Metabolic Panel (CMET) - CBC  2. Hyperlipidemia, unspecified hyperlipidemia type Unclear control on Lipitor - TSH - Lipid Profile  3. Benign prostatic hyperplasia with nocturia Flomax qhs - PSA  4. Erectile dysfunction, unspecified erectile dysfunction type Consider Viagra if unimproved on Flomax  5. MCI (mild cognitive impairment) Monitor, consider B12/RPR  6. Overweight (BMI 25.0-29.9) Discuss exercise/weight loss next visit  7. Need for influenza vaccination - Flu Vaccine QUAD 6+ mos PF IM (Fluarix Quad PF)  8. Need for diphtheria-tetanus-pertussis (Tdap) vaccine - Tdap vaccine greater than or equal to 7yo IM  9. History of CVA (cerebrovascular accident) Continue asa  10. FH: Alzheimer's disease  Return in about 4 weeks (around 07/17/2017).   45 minutes spent with pt over half in counseling  Kaylinn Dedic M. Bricen Victory, [facility]  06/19/2017

## 2017-06-20 ENCOUNTER — Other Ambulatory Visit: Payer: Self-pay | Admitting: Family Medicine

## 2017-06-20 LAB — CBC
HEMOGLOBIN: 14.4 g/dL (ref 13.0–17.7)
Hematocrit: 42.3 % (ref 37.5–51.0)
MCH: 29 pg (ref 26.6–33.0)
MCHC: 34 g/dL (ref 31.5–35.7)
MCV: 85 fL (ref 79–97)
Platelets: 258 10*3/uL (ref 150–379)
RBC: 4.97 x10E6/uL (ref 4.14–5.80)
RDW: 14.8 % (ref 12.3–15.4)
WBC: 6.9 10*3/uL (ref 3.4–10.8)

## 2017-06-20 LAB — COMPREHENSIVE METABOLIC PANEL
A/G RATIO: 1.9 (ref 1.2–2.2)
ALT: 27 IU/L (ref 0–44)
AST: 23 IU/L (ref 0–40)
Albumin: 5.1 g/dL — ABNORMAL HIGH (ref 3.6–4.8)
Alkaline Phosphatase: 78 IU/L (ref 39–117)
BUN/Creatinine Ratio: 10 (ref 10–24)
BUN: 10 mg/dL (ref 8–27)
Bilirubin Total: 1.2 mg/dL (ref 0.0–1.2)
CALCIUM: 9.8 mg/dL (ref 8.6–10.2)
CO2: 23 mmol/L (ref 20–29)
Chloride: 104 mmol/L (ref 96–106)
Creatinine, Ser: 1.03 mg/dL (ref 0.76–1.27)
GFR, EST AFRICAN AMERICAN: 88 mL/min/{1.73_m2} (ref >59)
GFR, EST NON AFRICAN AMERICAN: 76 mL/min/{1.73_m2} (ref >59)
GLUCOSE: 83 mg/dL (ref 65–99)
Globulin, Total: 2.7 g/dL (ref 1.5–4.5)
POTASSIUM: 4.1 mmol/L (ref 3.5–5.2)
Sodium: 144 mmol/L (ref 134–144)
Total Protein: 7.8 g/dL (ref 6.0–8.5)

## 2017-06-20 LAB — LIPID PANEL
CHOLESTEROL TOTAL: 235 mg/dL — AB (ref 100–199)
Chol/HDL Ratio: 3.9 ratio (ref 0.0–5.0)
HDL: 61 mg/dL (ref >39)
LDL CALC: 155 mg/dL — AB (ref 0–99)
Triglycerides: 93 mg/dL (ref 0–149)
VLDL CHOLESTEROL CAL: 19 mg/dL (ref 5–40)

## 2017-06-20 LAB — TSH: TSH: 1.8 u[IU]/mL (ref 0.450–4.500)

## 2017-06-20 LAB — PSA: PROSTATE SPECIFIC AG, SERUM: 1.3 ng/mL (ref 0.0–4.0)

## 2017-06-20 MED ORDER — ATORVASTATIN CALCIUM 40 MG PO TABS: 40.0000 mg | ORAL_TABLET | Freq: Every day | ORAL | 2 refills | Status: DC

## 2017-07-17 ENCOUNTER — Encounter: Payer: Self-pay | Admitting: Family Medicine

## 2017-07-17 ENCOUNTER — Ambulatory Visit (INDEPENDENT_AMBULATORY_CARE_PROVIDER_SITE_OTHER): Payer: 59 | Admitting: Family Medicine

## 2017-07-17 VITALS — BP 124/84 | HR 70 | Resp 16 | Ht 67.0 in | Wt 187.0 lb

## 2017-07-17 DIAGNOSIS — M25512 Pain in left shoulder: Secondary | ICD-10-CM | POA: Diagnosis not present

## 2017-07-17 DIAGNOSIS — H6123 Impacted cerumen, bilateral: Secondary | ICD-10-CM | POA: Diagnosis not present

## 2017-07-17 DIAGNOSIS — R351 Nocturia: Secondary | ICD-10-CM

## 2017-07-17 DIAGNOSIS — E663 Overweight: Secondary | ICD-10-CM

## 2017-07-17 DIAGNOSIS — I10 Essential (primary) hypertension: Secondary | ICD-10-CM

## 2017-07-17 DIAGNOSIS — E785 Hyperlipidemia, unspecified: Secondary | ICD-10-CM

## 2017-07-17 DIAGNOSIS — N401 Enlarged prostate with lower urinary tract symptoms: Secondary | ICD-10-CM | POA: Diagnosis not present

## 2017-07-17 DIAGNOSIS — G3184 Mild cognitive impairment, so stated: Secondary | ICD-10-CM

## 2017-07-17 NOTE — Patient Instructions (Addendum)
May take docusate as needed for hard stools and Zyrtec as needed for watery eyes. Use Debrox drops to help with ear wax removal.

## 2017-07-18 ENCOUNTER — Encounter: Payer: Self-pay | Admitting: Physician Assistant

## 2017-07-18 ENCOUNTER — Inpatient Hospital Stay
Admission: EM | Admit: 2017-07-18 | Discharge: 2017-07-20 | DRG: 246 | Disposition: A | Discharge status: Home / Self Care | Payer: No Typology Code available for payment source | Attending: Internal Medicine | Admitting: Internal Medicine

## 2017-07-18 ENCOUNTER — Inpatient Hospital Stay (HOSPITAL_COMMUNITY)
Admit: 2017-07-18 | Discharge: 2017-07-18 | Disposition: A | Discharge status: Home / Self Care | Payer: No Typology Code available for payment source | Attending: Cardiovascular Disease | Admitting: Cardiovascular Disease

## 2017-07-18 ENCOUNTER — Inpatient Hospital Stay: Payer: No Typology Code available for payment source

## 2017-07-18 ENCOUNTER — Encounter
Admission: EM | Disposition: A | Discharge status: Home / Self Care | Payer: Self-pay | Source: Home / Self Care | Attending: Internal Medicine

## 2017-07-18 ENCOUNTER — Other Ambulatory Visit: Payer: Self-pay

## 2017-07-18 DIAGNOSIS — I4901 Ventricular fibrillation: Secondary | ICD-10-CM | POA: Diagnosis present

## 2017-07-18 DIAGNOSIS — Z8249 Family history of ischemic heart disease and other diseases of the circulatory system: Secondary | ICD-10-CM

## 2017-07-18 DIAGNOSIS — Z7982 Long term (current) use of aspirin: Secondary | ICD-10-CM | POA: Diagnosis not present

## 2017-07-18 DIAGNOSIS — Z87891 Personal history of nicotine dependence: Secondary | ICD-10-CM | POA: Diagnosis not present

## 2017-07-18 DIAGNOSIS — K219 Gastro-esophageal reflux disease without esophagitis: Secondary | ICD-10-CM | POA: Diagnosis present

## 2017-07-18 DIAGNOSIS — R7301 Impaired fasting glucose: Secondary | ICD-10-CM | POA: Diagnosis present

## 2017-07-18 DIAGNOSIS — I462 Cardiac arrest due to underlying cardiac condition: Secondary | ICD-10-CM | POA: Diagnosis present

## 2017-07-18 DIAGNOSIS — E876 Hypokalemia: Secondary | ICD-10-CM | POA: Diagnosis present

## 2017-07-18 DIAGNOSIS — E785 Hyperlipidemia, unspecified: Secondary | ICD-10-CM | POA: Diagnosis present

## 2017-07-18 DIAGNOSIS — I2510 Atherosclerotic heart disease of native coronary artery without angina pectoris: Secondary | ICD-10-CM | POA: Diagnosis present

## 2017-07-18 DIAGNOSIS — I2111 ST elevation (STEMI) myocardial infarction involving right coronary artery: Secondary | ICD-10-CM | POA: Diagnosis not present

## 2017-07-18 DIAGNOSIS — R0602 Shortness of breath: Secondary | ICD-10-CM | POA: Diagnosis not present

## 2017-07-18 DIAGNOSIS — I252 Old myocardial infarction: Secondary | ICD-10-CM | POA: Diagnosis present

## 2017-07-18 DIAGNOSIS — R739 Hyperglycemia, unspecified: Secondary | ICD-10-CM | POA: Diagnosis present

## 2017-07-18 DIAGNOSIS — I959 Hypotension, unspecified: Secondary | ICD-10-CM | POA: Diagnosis present

## 2017-07-18 DIAGNOSIS — I25118 Atherosclerotic heart disease of native coronary artery with other forms of angina pectoris: Secondary | ICD-10-CM

## 2017-07-18 DIAGNOSIS — I4891 Unspecified atrial fibrillation: Secondary | ICD-10-CM | POA: Diagnosis present

## 2017-07-18 DIAGNOSIS — I480 Paroxysmal atrial fibrillation: Secondary | ICD-10-CM | POA: Diagnosis not present

## 2017-07-18 DIAGNOSIS — Z79899 Other long term (current) drug therapy: Secondary | ICD-10-CM

## 2017-07-18 DIAGNOSIS — I10 Essential (primary) hypertension: Secondary | ICD-10-CM | POA: Diagnosis present

## 2017-07-18 DIAGNOSIS — R079 Chest pain, unspecified: Secondary | ICD-10-CM

## 2017-07-18 DIAGNOSIS — I200 Unstable angina: Secondary | ICD-10-CM | POA: Diagnosis not present

## 2017-07-18 DIAGNOSIS — I2119 ST elevation (STEMI) myocardial infarction involving other coronary artery of inferior wall: Secondary | ICD-10-CM

## 2017-07-18 DIAGNOSIS — N400 Enlarged prostate without lower urinary tract symptoms: Secondary | ICD-10-CM | POA: Diagnosis present

## 2017-07-18 DIAGNOSIS — Z91013 Allergy to seafood: Secondary | ICD-10-CM

## 2017-07-18 DIAGNOSIS — I469 Cardiac arrest, cause unspecified: Secondary | ICD-10-CM

## 2017-07-18 DIAGNOSIS — Z8673 Personal history of transient ischemic attack (TIA), and cerebral infarction without residual deficits: Secondary | ICD-10-CM | POA: Diagnosis not present

## 2017-07-18 DIAGNOSIS — I255 Ischemic cardiomyopathy: Secondary | ICD-10-CM | POA: Diagnosis present

## 2017-07-18 HISTORY — DX: ST elevation (STEMI) myocardial infarction involving other coronary artery of inferior wall: I21.19

## 2017-07-18 HISTORY — PX: CORONARY/GRAFT ACUTE MI REVASCULARIZATION: CATH118305

## 2017-07-18 HISTORY — DX: Atherosclerotic heart disease of native coronary artery without angina pectoris: I25.10

## 2017-07-18 HISTORY — DX: Cardiac arrest, cause unspecified: I46.9

## 2017-07-18 HISTORY — PX: LEFT HEART CATH AND CORONARY ANGIOGRAPHY: CATH118249

## 2017-07-18 HISTORY — DX: Atherosclerotic heart disease of native coronary artery with other forms of angina pectoris: I25.118

## 2017-07-18 HISTORY — DX: Old myocardial infarction: I25.2

## 2017-07-18 LAB — LIPID PANEL
CHOL/HDL RATIO: 3.1 ratio (ref 0.0–5.0)
CHOL/HDL RATIO: 3.3 ratio
CHOLESTEROL TOTAL: 223 mg/dL — AB (ref 100–199)
CHOLESTEROL: 216 mg/dL — AB (ref 0–200)
HDL: 73 mg/dL (ref >39)
HDL: 66 mg/dL (ref >40)
LDL Calculated: 130 mg/dL — ABNORMAL HIGH (ref 0–99)
LDL Cholesterol: 129 mg/dL — ABNORMAL HIGH (ref 0–99)
TRIGLYCERIDES: 99 mg/dL (ref 0–149)
Triglycerides: 106 mg/dL (ref <150)
VLDL Cholesterol Cal: 20 mg/dL (ref 5–40)
VLDL: 21 mg/dL (ref 0–40)

## 2017-07-18 LAB — CBC WITH DIFFERENTIAL/PLATELET
BASOS ABS: 0.1 10*3/uL (ref 0–0.1)
BASOS PCT: 1 %
EOS PCT: 3 %
Eosinophils Absolute: 0.3 10*3/uL (ref 0–0.7)
HEMATOCRIT: 41.9 % (ref 40.0–52.0)
Hemoglobin: 14.1 g/dL (ref 13.0–18.0)
LYMPHS PCT: 53 %
Lymphs Abs: 4.7 10*3/uL — ABNORMAL HIGH (ref 1.0–3.6)
MCH: 28.3 pg (ref 26.0–34.0)
MCHC: 33.7 g/dL (ref 32.0–36.0)
MCV: 83.9 fL (ref 80.0–100.0)
MONO ABS: 0.9 10*3/uL (ref 0.2–1.0)
Monocytes Relative: 10 %
NEUTROS ABS: 3 10*3/uL (ref 1.4–6.5)
NEUTROS PCT: 33 %
PLATELETS: 301 10*3/uL (ref 150–440)
RBC: 4.99 MIL/uL (ref 4.40–5.90)
RDW: 15.1 % — AB (ref 11.5–14.5)
WBC: 9 10*3/uL (ref 3.8–10.6)

## 2017-07-18 LAB — COMPREHENSIVE METABOLIC PANEL
ALT: 31 U/L (ref 17–63)
ANION GAP: 13 (ref 5–15)
AST: 33 U/L (ref 15–41)
Albumin: 4.1 g/dL (ref 3.5–5.0)
Alkaline Phosphatase: 67 U/L (ref 38–126)
BUN: 15 mg/dL (ref 6–20)
CHLORIDE: 107 mmol/L (ref 101–111)
CO2: 18 mmol/L — ABNORMAL LOW (ref 22–32)
Calcium: 9.2 mg/dL (ref 8.9–10.3)
Creatinine, Ser: 0.9 mg/dL (ref 0.61–1.24)
Glucose, Bld: 149 mg/dL — ABNORMAL HIGH (ref 65–99)
POTASSIUM: 3.1 mmol/L — AB (ref 3.5–5.1)
Sodium: 138 mmol/L (ref 135–145)
Total Bilirubin: 1.1 mg/dL (ref 0.3–1.2)
Total Protein: 7.5 g/dL (ref 6.5–8.1)

## 2017-07-18 LAB — PROTIME-INR
INR: 0.99
PROTHROMBIN TIME: 13 s (ref 11.4–15.2)

## 2017-07-18 LAB — ECHOCARDIOGRAM COMPLETE
Height: 67 in
Weight: 3005.31 oz

## 2017-07-18 LAB — HEMOGLOBIN A1C
Hgb A1c MFr Bld: 5.8 % — ABNORMAL HIGH (ref 4.8–5.6)
MEAN PLASMA GLUCOSE: 119.76 mg/dL

## 2017-07-18 LAB — RPR: RPR Ser Ql: NONREACTIVE

## 2017-07-18 LAB — TROPONIN I
TROPONIN I: 0.04 ng/mL — AB (ref <0.03)
TROPONIN I: 9.3 ng/mL — AB (ref <0.03)
TROPONIN I: 43.86 ng/mL — AB (ref <0.03)
TROPONIN I: 44.68 ng/mL — AB (ref <0.03)

## 2017-07-18 LAB — APTT

## 2017-07-18 LAB — MAGNESIUM: Magnesium: 1.7 mg/dL (ref 1.7–2.4)

## 2017-07-18 LAB — MRSA PCR SCREENING: MRSA by PCR: NEGATIVE

## 2017-07-18 LAB — GLUCOSE, CAPILLARY
Glucose-Capillary: 151 mg/dL — ABNORMAL HIGH (ref 65–99)
Glucose-Capillary: 114 mg/dL — ABNORMAL HIGH (ref 65–99)
Glucose-Capillary: 130 mg/dL — ABNORMAL HIGH (ref 65–99)

## 2017-07-18 LAB — POCT ACTIVATED CLOTTING TIME: Activated Clotting Time: 307 seconds

## 2017-07-18 LAB — VITAMIN B12: VITAMIN B 12: 460 pg/mL (ref 232–1245)

## 2017-07-18 SURGERY — CORONARY/GRAFT ACUTE MI REVASCULARIZATION
Anesthesia: Moderate Sedation

## 2017-07-18 MED ORDER — MIDAZOLAM HCL 2 MG/2ML IJ SOLN
INTRAMUSCULAR | Status: AC
  Filled 2017-07-18: qty 2

## 2017-07-18 MED ORDER — AMIODARONE HCL IN DEXTROSE 360-4.14 MG/200ML-% IV SOLN
60.0000 mg/h | INTRAVENOUS | Status: AC
  Administered 2017-07-18 (×2): 60 mg/h via INTRAVENOUS
  Filled 2017-07-18: qty 200

## 2017-07-18 MED ORDER — FENTANYL CITRATE (PF) 100 MCG/2ML IJ SOLN
12.5000 ug | Freq: Once | INTRAMUSCULAR | Status: AC
  Administered 2017-07-18: 12.5 ug via INTRAVENOUS
  Filled 2017-07-18: qty 2

## 2017-07-18 MED ORDER — EPINEPHRINE PF 1 MG/10ML IJ SOSY
PREFILLED_SYRINGE | INTRAMUSCULAR | Status: AC
  Filled 2017-07-18: qty 10

## 2017-07-18 MED ORDER — ATORVASTATIN CALCIUM 20 MG PO TABS
80.0000 mg | ORAL_TABLET | Freq: Every day | ORAL | Status: DC
  Administered 2017-07-18 – 2017-07-19 (×2): 80 mg via ORAL
  Filled 2017-07-18 (×2): qty 4

## 2017-07-18 MED ORDER — HEPARIN SODIUM (PORCINE) 5000 UNIT/ML IJ SOLN
4000.0000 [IU] | Freq: Once | INTRAMUSCULAR | Status: AC
Start: 2017-07-18 — End: 2017-07-18
  Administered 2017-07-18: 4000 [IU] via INTRAVENOUS

## 2017-07-18 MED ORDER — AMIODARONE HCL IN DEXTROSE 360-4.14 MG/200ML-% IV SOLN
30.0000 mg/h | INTRAVENOUS | Status: DC
  Administered 2017-07-18 – 2017-07-19 (×2): 30 mg/h via INTRAVENOUS
  Filled 2017-07-18: qty 200

## 2017-07-18 MED ORDER — AMIODARONE HCL IN DEXTROSE 360-4.14 MG/200ML-% IV SOLN
INTRAVENOUS | Status: AC
  Administered 2017-07-18: 150 mg via INTRAVENOUS
  Filled 2017-07-18: qty 200

## 2017-07-18 MED ORDER — ONDANSETRON HCL 4 MG/2ML IJ SOLN
4.0000 mg | Freq: Four times a day (QID) | INTRAMUSCULAR | Status: DC | PRN
  Administered 2017-07-18 (×3): 4 mg via INTRAVENOUS
  Filled 2017-07-18 (×3): qty 2

## 2017-07-18 MED ORDER — ATROPINE SULFATE 1 MG/10ML IJ SOSY
PREFILLED_SYRINGE | INTRAMUSCULAR | Status: AC
  Filled 2017-07-18: qty 10

## 2017-07-18 MED ORDER — LIDOCAINE HCL (PF) 1 % IJ SOLN
INTRAMUSCULAR | Status: AC
  Filled 2017-07-18: qty 30

## 2017-07-18 MED ORDER — ASPIRIN 81 MG PO CHEW: 81.0000 mg | CHEWABLE_TABLET | Freq: Every day | ORAL | Status: DC

## 2017-07-18 MED ORDER — AMIODARONE LOAD VIA INFUSION
150.0000 mg | Freq: Once | INTRAVENOUS | Status: AC
  Administered 2017-07-18: 150 mg via INTRAVENOUS
  Filled 2017-07-18: qty 83.34

## 2017-07-18 MED ORDER — SODIUM CHLORIDE 0.9 % IV SOLN: 250.0000 mL | INTRAVENOUS | Status: DC | PRN

## 2017-07-18 MED ORDER — VERAPAMIL HCL 2.5 MG/ML IV SOLN
INTRAVENOUS | Status: AC
  Filled 2017-07-18: qty 2

## 2017-07-18 MED ORDER — SODIUM CHLORIDE 0.9 % IV SOLN
INTRAVENOUS | Status: AC | PRN
  Administered 2017-07-18: 50 mL/h via INTRAVENOUS

## 2017-07-18 MED ORDER — NOREPINEPHRINE BITARTRATE 1 MG/ML IV SOLN
INTRAVENOUS | Status: AC
  Filled 2017-07-18: qty 4

## 2017-07-18 MED ORDER — ATORVASTATIN CALCIUM 20 MG PO TABS: 40.0000 mg | ORAL_TABLET | Freq: Every day | ORAL | Status: DC

## 2017-07-18 MED ORDER — HEPARIN (PORCINE) IN NACL 2-0.9 UNIT/ML-% IJ SOLN
INTRAMUSCULAR | Status: AC
  Filled 2017-07-18: qty 500

## 2017-07-18 MED ORDER — TICAGRELOR 90 MG PO TABS
90.0000 mg | ORAL_TABLET | Freq: Two times a day (BID) | ORAL | Status: DC
  Administered 2017-07-18 – 2017-07-20 (×4): 90 mg via ORAL
  Filled 2017-07-18 (×5): qty 1

## 2017-07-18 MED ORDER — SODIUM CHLORIDE 0.9% FLUSH: 3.0000 mL | INTRAVENOUS | Status: DC | PRN

## 2017-07-18 MED ORDER — DOPAMINE-DEXTROSE 3.2-5 MG/ML-% IV SOLN
INTRAVENOUS | Status: DC | PRN
  Administered 2017-07-18: 10 ug/kg/min via INTRAVENOUS

## 2017-07-18 MED ORDER — HEPARIN SODIUM (PORCINE) 1000 UNIT/ML IJ SOLN
INTRAMUSCULAR | Status: AC
  Filled 2017-07-18: qty 1

## 2017-07-18 MED ORDER — TICAGRELOR 90 MG PO TABS
ORAL_TABLET | ORAL | Status: AC
  Filled 2017-07-18: qty 2

## 2017-07-18 MED ORDER — POTASSIUM CHLORIDE CRYS ER 20 MEQ PO TBCR
40.0000 meq | EXTENDED_RELEASE_TABLET | Freq: Once | ORAL | Status: AC
  Administered 2017-07-18: 40 meq via ORAL
  Filled 2017-07-18: qty 2

## 2017-07-18 MED ORDER — NOREPINEPHRINE BITARTRATE 1 MG/ML IV SOLN
INTRAVENOUS | Status: AC | PRN
  Administered 2017-07-18: 10 ug/min via INTRAVENOUS

## 2017-07-18 MED ORDER — MIDAZOLAM HCL 2 MG/2ML IJ SOLN
INTRAMUSCULAR | Status: DC | PRN
  Administered 2017-07-18: 1 mg via INTRAVENOUS

## 2017-07-18 MED ORDER — NITROGLYCERIN 5 MG/ML IV SOLN
INTRAVENOUS | Status: AC
  Filled 2017-07-18: qty 10

## 2017-07-18 MED ORDER — METOPROLOL TARTRATE 5 MG/5ML IV SOLN
2.5000 mg | Freq: Once | INTRAVENOUS | Status: AC
Start: 2017-07-18 — End: 2017-07-18
  Administered 2017-07-18: 2.5 mg via INTRAVENOUS

## 2017-07-18 MED ORDER — SODIUM CHLORIDE 0.9 % IV SOLN
INTRAVENOUS | Status: AC
  Administered 2017-07-18: 11:00:00 via INTRAVENOUS

## 2017-07-18 MED ORDER — MORPHINE SULFATE (PF) 2 MG/ML IV SOLN
2.0000 mg | INTRAVENOUS | Status: DC | PRN
  Administered 2017-07-18 (×2): 2 mg via INTRAVENOUS
  Filled 2017-07-18 (×2): qty 1

## 2017-07-18 MED ORDER — ATROPINE SULFATE 1 MG/10ML IJ SOSY
PREFILLED_SYRINGE | INTRAMUSCULAR | Status: DC | PRN
  Administered 2017-07-18: 0.5 mg via INTRAVENOUS

## 2017-07-18 MED ORDER — METOPROLOL TARTRATE 25 MG PO TABS
25.0000 mg | ORAL_TABLET | Freq: Two times a day (BID) | ORAL | Status: DC
  Administered 2017-07-18 – 2017-07-20 (×5): 25 mg via ORAL
  Filled 2017-07-18 (×5): qty 1

## 2017-07-18 MED ORDER — PROMETHAZINE HCL 25 MG/ML IJ SOLN
6.2500 mg | Freq: Once | INTRAMUSCULAR | Status: AC
  Administered 2017-07-18: 6.25 mg via INTRAVENOUS
  Filled 2017-07-18: qty 1

## 2017-07-18 MED ORDER — FENTANYL CITRATE (PF) 100 MCG/2ML IJ SOLN
INTRAMUSCULAR | Status: AC
  Filled 2017-07-18: qty 2

## 2017-07-18 MED ORDER — HEPARIN SODIUM (PORCINE) 1000 UNIT/ML IJ SOLN
INTRAMUSCULAR | Status: DC | PRN
  Administered 2017-07-18: 2000 [IU] via INTRAVENOUS
  Administered 2017-07-18: 5000 [IU] via INTRAVENOUS

## 2017-07-18 MED ORDER — DOPAMINE-DEXTROSE 3.2-5 MG/ML-% IV SOLN
INTRAVENOUS | Status: AC
  Filled 2017-07-18: qty 250

## 2017-07-18 MED ORDER — TICAGRELOR 90 MG PO TABS
ORAL_TABLET | ORAL | Status: DC | PRN
  Administered 2017-07-18: 180 mg via ORAL

## 2017-07-18 MED ORDER — SODIUM CHLORIDE 0.9% FLUSH
3.0000 mL | Freq: Two times a day (BID) | INTRAVENOUS | Status: DC
  Administered 2017-07-18 – 2017-07-20 (×5): 3 mL via INTRAVENOUS

## 2017-07-18 MED ORDER — NOREPINEPHRINE BITARTRATE 1 MG/ML IV SOLN
0.0000 ug/min | INTRAVENOUS | Status: DC
  Filled 2017-07-18: qty 4

## 2017-07-18 MED ORDER — ASPIRIN EC 81 MG PO TBEC
81.0000 mg | DELAYED_RELEASE_TABLET | Freq: Every day | ORAL | Status: DC
  Administered 2017-07-18 – 2017-07-20 (×3): 81 mg via ORAL
  Filled 2017-07-18 (×5): qty 1

## 2017-07-18 MED ORDER — ENOXAPARIN SODIUM 40 MG/0.4ML ~~LOC~~ SOLN
40.0000 mg | SUBCUTANEOUS | Status: DC
  Administered 2017-07-19 – 2017-07-20 (×2): 40 mg via SUBCUTANEOUS
  Filled 2017-07-18 (×2): qty 0.4

## 2017-07-18 MED ORDER — NITROGLYCERIN 1 MG/10 ML FOR IR/CATH LAB
INTRA_ARTERIAL | Status: DC | PRN
  Administered 2017-07-18: 200 ug via INTRACORONARY
  Administered 2017-07-18: 100 ug via INTRACORONARY

## 2017-07-18 MED ORDER — METOPROLOL TARTRATE 5 MG/5ML IV SOLN
INTRAVENOUS | Status: AC
  Administered 2017-07-18: 2.5 mg
  Filled 2017-07-18: qty 5

## 2017-07-18 MED ORDER — ACETAMINOPHEN 325 MG PO TABS: 650.0000 mg | ORAL_TABLET | ORAL | Status: DC | PRN

## 2017-07-18 SURGICAL SUPPLY — 18 items
BALLN TREK RX 2.5X15 (BALLOONS) ×3
BALLN ~~LOC~~ EUPHORA RX 3.25X20 (BALLOONS) ×3
BALLN ~~LOC~~ TREK RX 2.75X15 (BALLOONS) ×3
BALLOON TREK RX 2.5X15 (BALLOONS) ×1 IMPLANT
BALLOON ~~LOC~~ EUPHORA RX 3.25X20 (BALLOONS) ×1 IMPLANT
BALLOON ~~LOC~~ TREK RX 2.75X15 (BALLOONS) ×1 IMPLANT
CATH INFINITI 5 FR JL3.5 (CATHETERS) ×3 IMPLANT
CATH INFINITI 5FR ANG PIGTAIL (CATHETERS) IMPLANT
CATH VISTA GUIDE 6FR JR4 (CATHETERS) ×3 IMPLANT
DEVICE INFLAT 30 PLUS (MISCELLANEOUS) ×3 IMPLANT
DEVICE RAD TR BAND REGULAR (VASCULAR PRODUCTS) ×3 IMPLANT
GLIDESHEATH SLEND SS 6F .021 (SHEATH) ×3 IMPLANT
KIT MANI 3VAL PERCEP (MISCELLANEOUS) ×3 IMPLANT
PACK CARDIAC CATH (CUSTOM PROCEDURE TRAY) ×3 IMPLANT
STENT SIERRA 3.00 X 38 MM (Permanent Stent) ×3 IMPLANT
STENT SIERRA 3.25 X 18 MM (Permanent Stent) ×3 IMPLANT
WIRE ROSEN-J .035X260CM (WIRE) ×3 IMPLANT
WIRE RUNTHROUGH .014X180CM (WIRE) ×3 IMPLANT

## 2017-07-18 NOTE — Progress Notes (Signed)
1300 Dr. Fletcher Anon paged for nausea post treatment for nausea at 1123.  No diaphoresis and chest pain associated with nausea at 1123 or now, just nausea. Patient was noted to be in Atrial Fiberlation at time of call. Also reported A.Fib.  to Dr. Fletcher Anon. Orders received for both. After extensive review of ECG strips noted that patient had been in A.Fib since 0935 but nurse was not notified by [facility].

## 2017-07-18 NOTE — Progress Notes (Signed)
1627 Given prn for chest pain. Dr. Fletcher Anon in to check on patient. Notified of increase in Troponin x 2. No orders received.

## 2017-07-18 NOTE — Progress Notes (Addendum)
Progress Note  Patient Name: Todd Weaver Date of Encounter: 07/19/2017  Primary Cardiologist: No primary care provider on file.   Subjective   Presented as inferior STEMI, s/p PCI/DES x 2 to the RCA with residual LAD disease. EF 45-50% with moderate HK of the interior wall. No further chest pain. Developed new onset Afib in the ICU, was started on amiodarone infusion with conversion to NSR at 22:40 and has remained in NSR since. Peak troponin 44 currently. K+ low at 3.2. Mild leukocytosis this morning.   Inpatient Medications    Scheduled Meds: . aspirin EC  81 mg Oral Daily  . atorvastatin  80 mg Oral q1800  . enoxaparin (LOVENOX) injection  40 mg Subcutaneous Q24H  . losartan  12.5 mg Oral Daily  . magnesium oxide  400 mg Oral BID  . metoprolol tartrate  25 mg Oral BID  . potassium chloride  40 mEq Oral BID  . sodium chloride flush  3 mL Intravenous Q12H  . ticagrelor  90 mg Oral BID   Continuous Infusions: . sodium chloride    . amiodarone 30 mg/hr (07/19/17 0209)  . norepinephrine (LEVOPHED) Adult infusion 2.5 mcg/min (07/18/17 1123)   PRN Meds: sodium chloride, acetaminophen, morphine injection, ondansetron (ZOFRAN) IV, sodium chloride flush   Vital Signs    Vitals:   07/19/17 0321 07/19/17 0400 07/19/17 0500 07/19/17 0600  BP: 117/85 116/73 105/66 110/64  Pulse: (!) 56 67 80 69  Resp: 18 11    Temp:      TempSrc:      SpO2: 96% 100% 98% 96%  Weight:      Height:        Intake/Output Summary (Last 24 hours) at 07/19/2017 0746 Last data filed at 07/19/2017 0600 Gross per 24 hour  Intake 2364.71 ml  Output 2125 ml  Net 239.71 ml   Filed Weights   07/18/17 0900  Weight: 187 lb 13.3 oz (85.2 kg)    Telemetry    Converted to NSR at 22:40 on 12/27 - Personally Reviewed  ECG    n/a - Personally Reviewed  Physical Exam   GEN: No acute distress.   Neck: No JVD Cardiac: RRR, no murmurs, rubs, or gallops. Right radial cath site without bleeding,  bruising, swelling, erythema, or TTP. Radial pulse 2+. Respiratory: Clear to auscultation bilaterally. GI: Soft, nontender, non-distended  MS: No edema; No deformity. Neuro:  Nonfocal  Psych: Normal affect   Labs    Chemistry Recent Labs  Lab 07/18/17 0645 07/19/17 0546  NA 138 135  K 3.1* 3.2*  CL 107 106  CO2 18* 21*  GLUCOSE 149* 153*  BUN 15 10  CREATININE 0.90 0.88  CALCIUM 9.2 8.7*  PROT 7.5  --   ALBUMIN 4.1  --   AST 33  --   ALT 31  --   ALKPHOS 67  --   BILITOT 1.1  --   GFRNONAA >60 >60  GFRAA >60 >60  ANIONGAP 13 8     Hematology Recent Labs  Lab 07/18/17 0645 07/19/17 0546  WBC 9.0 11.3*  RBC 4.99 4.72  HGB 14.1 13.1  HCT 41.9 39.7*  MCV 83.9 84.2  MCH 28.3 27.9  MCHC 33.7 33.1  RDW 15.1* 15.2*  PLT 301 265    Cardiac Enzymes Recent Labs  Lab 07/18/17 0645 07/18/17 1004 07/18/17 1531 07/18/17 1958  TROPONINI 0.04* 9.30* 43.86* 44.68*   No results for input(s): TROPIPOC in the last 168 hours.  BNPNo results for input(s): BNP, PROBNP in the last 168 hours.   DDimer No results for input(s): DDIMER in the last 168 hours.   Radiology    Dg Chest Port 1 View  Result Date: 07/18/2017 IMPRESSION: No active disease. Electronically Signed   By: Kerby Moors M.D.   On: 07/18/2017 10:05    Cardiac Studies  LHC 07/18/17:  Ost RCA to Mid RCA lesion is 99% stenosed.  A drug-eluting stent was successfully placed using a STENT SIERRA 3.00 X 38 MM.  Post intervention, there is a 0% residual stenosis.  A stent was successfully placed.  Post Atrio lesion is 60% stenosed.  Dist LM to Ost LAD lesion is 85% stenosed.  Prox Cx lesion is 40% stenosed.  Prox LAD lesion is 60% stenosed.  Mid LAD lesion is 40% stenosed.   1.  Significant two-vessel coronary artery disease with subtotal occlusion of the proximal right coronary artery which is the culprit for inferior ST elevation myocardial infarction.  There is also 85% ostial LAD  stenosis.  There is a medium sized ramus branch with 60% ostial stenosis. 2.  Normal left ventricular end-diastolic pressure. 3.  Successful complex angioplasty and 2 overlapped drug-eluting stent placement to the proximal and ostial right coronary artery.  TTE 07/18/17: Study Conclusions  - Left ventricle: The cavity size was normal. There was mild   concentric hypertrophy. Systolic function was mildly reduced. The   estimated ejection fraction was in the range of 45% to 50%.   Moderate hypokinesis of the inferior and inferoseptal myocardium.   The study was not technically sufficient to allow evaluation of   LV diastolic dysfunction due to atrial fibrillation. - Left atrium: The atrium was mildly dilated.  Patient Profile     66 year old male with  history of previous mild stroke, hypertension, hyperlipidemia and prior tobacco use who presented with acute onset of chest pain with EKG in the field showing evidence of inferior ST elevation myocardial infarction. Code STEMI was activated but the patient suffered ventricular fibrillation en route which was treated successfully with defibrillation. In the ED, he developed another episode of ventricular fibrillation and was again treated with 1 shock. Taken to the cath lab with successful PCI/DES x 2 to RCA with residual LAD disease. In the ICU, he developed new onset Afib with RVR with conversion to NSR at 22:40.   Assessment & Plan    1.  Inferior ST elevation myocardial infarction complicated by ventricular fibrillation arrest:  -Status post successful PCI and 2 drug-eluting stent placement to the right coronary artery.   -Residual ostial LAD stenosis, unclear if this will require intervention in the future. The stenosis seems to extend to the distal left main and probably not very optimal for PCI especially with the presence of a ramus branch that has ostial disease. We can consider repeat angiography in few months with IVIS and FFR for  evaluation. -Currently chest pain free -Cycle troponin until peak -Continue dual antiplatelet therapy for at least one year -Cardiac rehab -Has a Brilinta card  2.  Ischemic cardiomyopathy:  -He does not appear grossly volume overloaded -Echo showed an EF of 45% with inferior wall hypokinesis -Lopressor 25 mg bid -Start losartan 12.5 mg daily -CHF education -Daily weights -Strict Is and Os -Consider repeat echo in ~ 3 months as an outpatient   3.  New onset atrial fibrillation: -Developed atrial fibrillation in the ICU after stent placement   -Started on amiodarone drip with conversion to  NSR at 22:40, has remained in NSR since -Stop amiodarone infusion -Given this episode was Afib was brief and in the setting of hypokalemia and acute inferior STEMI we would like to avoid addition of anticoagulation as this would place him on triple therapy -If he re-develops Afib, would need to consider anticoagulation -Check TSH -Replete potassium to goal > 4.0 -Replete magnesium to goal > 2.0  4. HLD: -Lipitor 80 mg -Goal LDL< 70 -If LDL remains > goal in ~ 6-8 weeks consider Zetia vs PCSK9 inhibitor   5. Dispo: -Transfer to telemetry today -Will need to monitor for at least another 24 hours    For questions or updates, please contact [place] Please consult [email or web address] for contact info under Cardiology/STEMI.      Signed, [name], PA-C  07/19/2017, 7:46 AM     Attending Note Patient seen and examined, agree with detailed note above,  Patient presentation and plan discussed on rounds.   No chest pain overnight, Long discussion concerning cardiac catheterization results, performed yesterday Prior history of smoking stopped 10 years ago, nondiabetics  history of hyperlipidemia  On physical exam no JVD, alert oriented, lungs clear to auscultation bilaterally, regular rate and rhythm with no murmurs appreciated, abdomen soft nontender, no significant lower  extremity edema  Lab work reviewed showing potassium 3.2, creatinine 0.88, troponin 44 trending down to 25, TSH 1.2 Hemoglobin A1c 5.8, total cholesterol 216  A/P:  1.  Inferior ST elevation myocardial infarction complicated by ventricular fibrillation arrest:   PCI and 2 drug-eluting stent placement to the right coronary artery.   Residual ostial LAD stenosis, unclear if this will require intervention in the future.  consider repeat angiography in few months with IVIS and FFR for evaluation. Troponin trending down -Continue dual antiplatelet therapy for at least one year -Cardiac rehab Long discussion concerning risk factors for coronary disease, also discussed residual LAD stenosis and possible need for intervention in the future or any anginal symptoms  2.  Ischemic cardiomyopathy:   EF of 45% with inferior wall hypokinesis -Lopressor 25 mg bid,  losartan 12.5 mg daily Long discussion concerning CHF education -Daily weights -Strict Is and Os -Consider repeat echo in ~ 3 months as an outpatient   3.  New onset atrial fibrillation: -Developed atrial fibrillation in the ICU after stent placement   on amiodarone drip with conversion to NSR at 22:40 Would stop amiodarone infusion, Start amiodarone oral dosing 400 twice daily for 5 days then down to 200 twice daily.  This can likely be held in outpatient follow-up  avoid addition of anticoagulation as this would place him on triple therapy -Replete potassium to goal > 4.0 -Replete magnesium to goal > 2.0  4. HLD: -Lipitor 80 mg -Goal LDL< 70  5. Dispo: -Transfer to telemetry today -Will need to monitor for at least another 24 hours Cardiac rehab  Long discussion concerning catheterization results yesterday, residual LAD disease, recovery time, CHF instructions/management, medications, need for cardiac rehab, work release Greater than 50% was spent in counseling and coordination of care with patient Total encounter time 35  minutes or more   Signed: Esmond Plants  M.D., Ph.D. [facility]

## 2017-07-18 NOTE — ED Triage Notes (Signed)
Pt bib [facility] call out for CP, code stemi activated. Pt went into vfib, shocked 1 time @360  and returned to NSR.  Pt was out splitting wood approx 0430. Hx HTN.   Upon arrival pt went into vfib again, shocked once and back into NSR.

## 2017-07-18 NOTE — Progress Notes (Signed)
  Amiodarone Drug - Drug Interaction Consult Note  Recommendations: QTC= 441 Patient is ordered ondansetron which may prolong QT interval although this order is prn. There are no other clinically significant drug interactions with amiodarone at present. Will continue to monitor.   Amiodarone is metabolized by the cytochrome P450 system and therefore has the potential to cause many drug interactions. Amiodarone has an average plasma half-life of 50 days (range 20 to 100 days).   There is potential for drug interactions to occur several weeks or months after stopping treatment and the onset of drug interactions may be slow after initiating amiodarone.   []  Statins: Increased risk of myopathy. Simvastatin- restrict dose to 20mg  daily. Other statins: counsel patients to report any muscle pain or weakness immediately.  []  Anticoagulants: Amiodarone can increase anticoagulant effect. Consider warfarin dose reduction. Patients should be monitored closely and the dose of anticoagulant altered accordingly, remembering that amiodarone levels take several weeks to stabilize.  []  Antiepileptics: Amiodarone can increase plasma concentration of phenytoin, the dose should be reduced. Note that small changes in phenytoin dose can result in large changes in levels. Monitor patient and counsel on signs of toxicity.  []  Beta blockers: increased risk of bradycardia, AV block and myocardial depression. Sotalol - avoid concomitant use.  []   Calcium channel blockers (diltiazem and verapamil): increased risk of bradycardia, AV block and myocardial depression.  []   Cyclosporine: Amiodarone increases levels of cyclosporine. Reduced dose of cyclosporine is recommended.  []  Digoxin dose should be halved when amiodarone is started.  []  Diuretics: increased risk of cardiotoxicity if hypokalemia occurs.  []  Oral hypoglycemic agents (glyburide, glipizide, glimepiride): increased risk of hypoglycemia. Patient's glucose  levels should be monitored closely when initiating amiodarone therapy.   [x]  Drugs that prolong the QT interval:  Torsades de pointes risk may be increased with concurrent use - avoid if possible.  Monitor QTc, also keep magnesium/potassium WNL if concurrent therapy can't be avoided. Marland Kitchen Antibiotics: e.g. fluoroquinolones, erythromycin. . Antiarrhythmics: e.g. quinidine, procainamide, disopyramide, sotalol. . Antipsychotics: e.g. phenothiazines, haloperidol.  . Lithium, tricyclic antidepressants, and methadone. Thank You,  Todd Weaver D  07/18/2017 4:42 PM

## 2017-07-18 NOTE — H&P (Signed)
[facility] [place] NAME: Todd Weaver    MR#:  [id number]  DATE OF BIRTH:  08/06/1950  DATE OF ADMISSION:  07/18/2017  PRIMARY CARE PHYSICIAN: Adline Potter, MD   REQUESTING/REFERRING PHYSICIAN: Dr Fletcher Anon  CHIEF COMPLAINT:   Chief Complaint  Patient presents with  . Chest Pain  . Code STEMI    HISTORY OF PRESENT ILLNESS:  Todd Weaver  is a 66 y.o. male presented with a code STEMI from EMS.  Patient was defibrillated by EMS and again in the ER.  Patient was taken emergently to the cardiac Cath Lab.  Patient had ST elevation myocardial infarction.  Patient had stents done by Dr. Fletcher Anon emergently.  Patient was seen in the ICU.  He was feeling little bit better.  He stated yesterday that he was chopping wood and he felt sick and he had to sit down on the porch.  This a.m. he was nauseous sweating and short of breath and had left shoulder pain 10 out of 10 in intensity.  He was unable to get any relief no matter what he did.  Pain was so severe that he ended up calling EMS.  Now after the cardiac catheterization it is just nagging pain.  Patient seen this morning after cardiac catheterization.  PAST MEDICAL HISTORY:   Past Medical History:  Diagnosis Date  . Acute ST elevation myocardial infarction (STEMI) of inferior wall ([place]) 07/18/2017  . CAD (coronary artery disease)    a. nuc stress test 9/17: small defect of mild severity in the apex location, partially reversible and suspected to be due to artifact given intense GI uptake, EF 55-65%, low risk study; b. inferior STEMI 07/18/17  . GERD (gastroesophageal reflux disease)   . Hemorrhoid   . Hyperlipidemia   . Hypertension   . Stroke [facility]) 2011   "2 MINI STROKES"    PAST SURGICAL HISTORY:   Past Surgical History:  Procedure Laterality Date  . ANAL FISTULECTOMY N/A 12/21/2014   Procedure: FISTULECTOMY ANAL;  Surgeon: Christene Lye, MD;  Location: [facility] ORS;   Service: General;  Laterality: N/A;  . APPENDECTOMY  1975  . CORONARY/GRAFT ACUTE MI REVASCULARIZATION N/A 07/18/2017   Procedure: Coronary/Graft Acute MI Revascularization;  Surgeon: Wellington Hampshire, MD;  Location: [place] CV LAB;  Service: Cardiovascular;  Laterality: N/A;  . HEMORRHOID SURGERY N/A 12/21/2014   Procedure: HEMORRHOIDECTOMY;  Surgeon: Christene Lye, MD;  Location: [facility] ORS;  Service: General;  Laterality: N/A;  . KNEE SURGERY Right age 54  . LEFT HEART CATH AND CORONARY ANGIOGRAPHY N/A 07/18/2017   Procedure: LEFT HEART CATH AND CORONARY ANGIOGRAPHY;  Surgeon: Wellington Hampshire, MD;  Location: [place] CV LAB;  Service: Cardiovascular;  Laterality: N/A;  . ROTATOR CUFF REPAIR Right 2013    SOCIAL HISTORY:   Social History   Tobacco Use  . Smoking status: Former Smoker    Last attempt to quit: 07/23/1998    Years since quitting: 19.0  . Smokeless tobacco: Never Used  Substance Use Topics  . Alcohol use: Yes    Alcohol/week: 0.0 oz    Comment: OCC    FAMILY HISTORY:   Family History  Problem Relation Age of Onset  . Hypertension Mother   . Alzheimer's disease Mother   . Hypertension Father   . Alzheimer's disease Father     DRUG ALLERGIES:   Allergies  Allergen Reactions  . Shellfish Allergy Hives    swelling  .  Codeine     REVIEW OF SYSTEMS:  CONSTITUTIONAL: No fever.  Positive sweating and cold chill.  Positive for fatigue.  EYES: No blurred or double vision.  EARS, NOSE, AND THROAT: No tinnitus or ear pain. No sore throat RESPIRATORY: No cough.  Positive for shortness of breath, no wheezing or hemoptysis.  CARDIOVASCULAR: No chest pain, orthopnea, edema.  GASTROINTESTINAL: Positive for nausea and vomiting.  No diarrhea or abdominal pain. No blood in bowel movements.  History of constipation GENITOURINARY: No dysuria, hematuria.  ENDOCRINE: No polyuria, nocturia,  HEMATOLOGY: No anemia, easy bruising or bleeding SKIN: No rash  or lesion. MUSCULOSKELETAL: No joint pain or arthritis.   NEUROLOGIC: No tingling, numbness, weakness.  PSYCHIATRY: No anxiety or depression.   MEDICATIONS AT HOME:   Prior to Admission medications   Medication Sig Start Date End Date Taking? Authorizing Provider  amLODipine (NORVASC) 5 MG tablet Take 1 tablet (5 mg total) by mouth daily. 06/26/16 07/18/17 Yes Wende Bushy, MD  aspirin EC 81 MG tablet Take 81 mg by mouth once.  05/23/09  Yes [provider]  atorvastatin (LIPITOR) 40 MG tablet Take 1 tablet (40 mg total) by mouth daily. 06/20/17  Yes Plonk, Gwyndolyn Saxon, MD  tamsulosin (FLOMAX) 0.4 MG CAPS capsule Take 1 capsule (0.4 mg total) by mouth daily. 06/19/17  Yes Plonk, Gwyndolyn Saxon, MD      VITAL SIGNS:  Blood pressure 117/82, pulse 90, temperature 97.8 F (36.6 C), resp. rate (!) 24, height 5\' 7"  (1.702 m), weight 85.2 kg (187 lb 13.3 oz), SpO2 100 %.  PHYSICAL EXAMINATION:  GENERAL:  66 y.o.-year-old patient lying in the bed with no acute distress.  EYES: Pupils equal, round, reactive to light and accommodation. No scleral icterus. Extraocular muscles intact.  HEENT: Head atraumatic, normocephalic. Oropharynx and nasopharynx clear.  NECK:  Supple, no jugular venous distention. No thyroid enlargement, no tenderness.  LUNGS: Normal breath sounds bilaterally, no wheezing, rales,rhonchi or crepitation. No use of accessory muscles of respiration.  CARDIOVASCULAR: S1, S2 normal. No murmurs, rubs, or gallops.  ABDOMEN: Soft, nontender, nondistended. Bowel sounds present. No organomegaly or mass.  EXTREMITIES: Trace edema, no cyanosis, or clubbing.  NEUROLOGIC: Cranial nerves II through XII are intact. Muscle strength 5/5 in all extremities. Sensation intact. Gait not checked.  PSYCHIATRIC: The patient is alert and oriented x 3.  SKIN: No rash, lesion, or ulcer.   LABORATORY PANEL:   CBC Recent Labs  Lab 07/18/17 0645  WBC 9.0  HGB 14.1  HCT 41.9  PLT 301    ------------------------------------------------------------------------------------------------------------------  Chemistries  Recent Labs  Lab 07/18/17 0645 07/18/17 1004  NA 138  --   K 3.1*  --   CL 107  --   CO2 18*  --   GLUCOSE 149*  --   BUN 15  --   CREATININE 0.90  --   CALCIUM 9.2  --   MG  --  1.7  AST 33  --   ALT 31  --   ALKPHOS 67  --   BILITOT 1.1  --    ------------------------------------------------------------------------------------------------------------------  Cardiac Enzymes Recent Labs  Lab 07/18/17 1004  TROPONINI 9.30*   ------------------------------------------------------------------------------------------------------------------  RADIOLOGY:  Dg Chest Port 1 View  Result Date: 07/18/2017 CLINICAL DATA:  Chest pain EXAM: PORTABLE CHEST 1 VIEW COMPARISON:  02/16/2016 FINDINGS: Mild cardiac enlargement. No pleural effusion or edema identified. No airspace opacities identified. IMPRESSION: No active disease. Electronically Signed   By: Kerby Moors M.D.   On: 07/18/2017  10:05    EKG:   ST elevation in V1 through V4 and inferiorly  IMPRESSION AND PLAN:   1.  STEMI.  Patient was taken urgently to the cardiac catheterization lab and 2 stents were placed.  Patient is on aspirin, Brilinta, atorvastatin and metoprolol.  Echocardiogram showed normal ejection fraction. 2.  Hyperlipidemia unspecified on Lipitor.  LDL 129 goal less than 70 3.  Hypokalemia replace potassium orally. 4.  Impaired fasting glucose.  Hemoglobin A1c 5.8.  Patient is not a diabetic. 5.  Essential hypertension.  Patient placed on metoprolol  All the records are reviewed and case discussed with ED provider. Management plans discussed with the patient, family and they are in agreement.  CODE STATUS: Full code  TOTAL TIME TAKING CARE OF THIS PATIENT: 50 minutes.    Loletha Grayer M.D on 07/18/2017 at 3:19 PM  Between 7am to 6pm - Pager - [phone]  After  6pm call admission pager [phone]  [employer] Office  [phone]  CC: Primary care physician; Adline Potter, MD

## 2017-07-18 NOTE — Progress Notes (Signed)
0900 Admitted from Cath.lab post post PCI. Alert. Scared. Explained what happen, procedure etc. Will start education later today once patient is receptive.

## 2017-07-18 NOTE — Progress Notes (Signed)
Chaplain received a page for Code Stemi for Pt. arriving in ED02. [place] visited pt. but pt. had not arrived. Pt arrives several minutes later, coded again as the MT was assessing him and was resuscitated. No family was present. Pt is undergoing a procedure in [employer]. [place] provided a ministry of presence.    07/18/17 0700  Clinical Encounter Type  Visited With Patient;Health care provider  Visit Type Initial;Code;ED  Referral From Nurse  Consult/Referral To Chaplain  Spiritual Encounters  Spiritual Needs Other (Comment)

## 2017-07-18 NOTE — Progress Notes (Signed)
Slight chest pain x 2 but patient refuses pain medicine. No nausea. Resting.

## 2017-07-18 NOTE — Progress Notes (Signed)
Date:  07/17/2017   Name:  Todd Weaver   DOB:  1951/03/14   MRN:  [id number]  PCP:  Adline Potter, MD    Chief Complaint: Hypertension   History of Present Illness:  This is a 66 y.o. male seen for one month f/u from initial visit. Urinating better on Flomax, increased Lipitor after last visit. C/o watery eyes, has not tried antihistamine. C/o hard stools and decreased hearing past few days, as well as some L shoulder pain after cutting wood over holiday. Planning to join gym in new year.  Review of Systems:  Review of Systems  Constitutional: Negative for chills and fever.  HENT: Negative for ear pain and sinus pain.   Respiratory: Negative for cough and shortness of breath.   Cardiovascular: Negative for chest pain and leg swelling.  Gastrointestinal: Negative for abdominal pain.  Genitourinary: Negative for difficulty urinating.  Neurological: Negative for syncope and light-headedness.    Patient Active Problem List   Diagnosis Date Noted  . Acute myocardial infarction of inferior wall ([place]) 07/18/2017  . STEMI (ST elevation myocardial infarction) ([place]) 07/18/2017  . Hypertension 06/19/2017  . Hyperlipidemia 06/19/2017  . History of CVA (cerebrovascular accident) 06/19/2017  . Overweight (BMI 25.0-29.9) 06/19/2017  . BPH (benign prostatic hyperplasia) 06/19/2017  . ED (erectile dysfunction) 06/19/2017  . MCI (mild cognitive impairment) 06/19/2017  . FH: Alzheimer's disease 06/19/2017  . Lumbar spondylosis with myelopathy 11/08/2016  . Hand dermatitis 07/26/2014    Prior to Admission medications   Medication Sig Start Date End Date Taking? Authorizing Provider  amLODipine (NORVASC) 5 MG tablet Take 1 tablet (5 mg total) by mouth daily. 06/26/16 07/18/17 Yes Wende Bushy, MD  aspirin EC 81 MG tablet Take 81 mg by mouth once.  05/23/09  Yes [provider]  atorvastatin (LIPITOR) 40 MG tablet Take 1 tablet (40 mg total) by mouth daily. 06/20/17  Yes Chancey Cullinane,  Gwyndolyn Saxon, MD  tamsulosin (FLOMAX) 0.4 MG CAPS capsule Take 1 capsule (0.4 mg total) by mouth daily. 06/19/17  Yes Paschal Blanton, Gwyndolyn Saxon, MD    Allergies  Allergen Reactions  . Shellfish Allergy Hives    swelling  . Codeine     Past Surgical History:  Procedure Laterality Date  . ANAL FISTULECTOMY N/A 12/21/2014   Procedure: FISTULECTOMY ANAL;  Surgeon: Christene Lye, MD;  Location: [facility] ORS;  Service: General;  Laterality: N/A;  . APPENDECTOMY  1975  . CORONARY/GRAFT ACUTE MI REVASCULARIZATION N/A 07/18/2017   Procedure: Coronary/Graft Acute MI Revascularization;  Surgeon: Wellington Hampshire, MD;  Location: [place] [facility];  Service: Cardiovascular;  Laterality: N/A;  . HEMORRHOID SURGERY N/A 12/21/2014   Procedure: HEMORRHOIDECTOMY;  Surgeon: Christene Lye, MD;  Location: [facility] ORS;  Service: General;  Laterality: N/A;  . KNEE SURGERY Right age 61  . LEFT HEART CATH AND CORONARY ANGIOGRAPHY N/A 07/18/2017   Procedure: LEFT HEART CATH AND CORONARY ANGIOGRAPHY;  Surgeon: Wellington Hampshire, MD;  Location: [place] [facility];  Service: Cardiovascular;  Laterality: N/A;  . ROTATOR CUFF REPAIR Right 2013    Social History   Tobacco Use  . Smoking status: Former Smoker    Last attempt to quit: 07/23/1998    Years since quitting: 19.0  . Smokeless tobacco: Never Used  Substance Use Topics  . Alcohol use: Yes    Alcohol/week: 0.0 oz    Comment: OCC  . Drug use: No    Family History  Problem Relation Age of Onset  . Hypertension  Mother   . Hypertension Father   . Alzheimer's disease Father     Medication list has been reviewed and updated.  Physical Examination: BP 124/84   Pulse 70   Resp 16   Ht 5\' 7"  (1.702 m)   Wt 187 lb (84.8 kg)   SpO2 97%   BMI 29.29 kg/m   Physical Exam  Constitutional: He appears well-developed and well-nourished.  HENT:  B EAC with cerumen impactions  Cardiovascular: Normal rate, regular rhythm and normal heart sounds.   Pulmonary/Chest: Effort normal and breath sounds normal.  Musculoskeletal: He exhibits no edema.  L shoulder with full ROM  Neurological: He is alert.  Skin: Skin is warm and dry.  Psychiatric: He has a normal mood and affect. His behavior is normal.  Nursing note and vitals reviewed.   Assessment and Plan:  1. Essential hypertension Improved control, cont amlodipine  2. Benign prostatic hyperplasia with nocturia Sxs improved on Flomax  3. Hyperlipidemia, unspecified hyperlipidemia type On increased Lipitor - Lipid Profile  4. MCI (mild cognitive impairment) Stable - B12 - RPR  5. Bilateral impacted cerumen Flushed by CMA, L clear, R still with cerumen, trial OTC Debrox, call if ineffective  6. Left shoulder pain, unspecified chronicity Likely MS, advised avoiding unusual activity until resolves  7. Overweight (BMI 25.0-29.9) Exercise/weight loss discussed  Return in about 6 months (around 01/15/2018).  Todd Weaver. [facility]  07/18/2017

## 2017-07-18 NOTE — ED Provider Notes (Signed)
[facility] Emergency Department Provider Note   First MD Initiated Contact with Patient 07/18/17 [phone]     (approximate)  I have reviewed the triage vital signs and the nursing notes.   HISTORY  Chief Complaint Chest Pain and Code STEMI   HPI Todd Weaver is a 66 y.o. male with history of hypertension hyperlipidemia presents to the emergency department via EMS as a code STEMI with 3 of acute onset of chest pain which patient states began at 4:30 this morning while chopping wood.  The patient admits to central chest pain radiating to the left arm which was 10 out of 10 associated with dyspnea.  Patient denied any nausea or vomiting but does admit to acute diaphoresis.  While in route with EMS patient had episode of ventricular fibrillation requiring defibrillation x1 with return of normal sinus rhythm.  Patient denies any pain at present   Past Medical History:  Diagnosis Date  . GERD (gastroesophageal reflux disease)   . Hemorrhoid   . Hyperlipidemia   . Hypertension   . Stroke [facility]) 2011   "2 MINI STROKES"    Patient Active Problem List   Diagnosis Date Noted  . Hypertension 06/19/2017  . Hyperlipidemia 06/19/2017  . History of CVA (cerebrovascular accident) 06/19/2017  . Overweight (BMI 25.0-29.9) 06/19/2017  . BPH (benign prostatic hyperplasia) 06/19/2017  . ED (erectile dysfunction) 06/19/2017  . MCI (mild cognitive impairment) 06/19/2017  . FH: Alzheimer's disease 06/19/2017  . Lumbar spondylosis with myelopathy 11/08/2016  . Hand dermatitis 07/26/2014    Past Surgical History:  Procedure Laterality Date  . ANAL FISTULECTOMY N/A 12/21/2014   Procedure: FISTULECTOMY ANAL;  Surgeon: Christene Lye, MD;  Location: [facility] ORS;  Service: General;  Laterality: N/A;  . APPENDECTOMY  1975  . HEMORRHOID SURGERY N/A 12/21/2014   Procedure: HEMORRHOIDECTOMY;  Surgeon: Christene Lye, MD;  Location: [facility] ORS;  Service: General;   Laterality: N/A;  . KNEE SURGERY Right age 89  . ROTATOR CUFF REPAIR Right 2013    Prior to Admission medications   Medication Sig Start Date End Date Taking? Authorizing Provider  amLODipine (NORVASC) 5 MG tablet Take 1 tablet (5 mg total) by mouth daily. 06/26/16 07/17/17  Wende Bushy, MD  aspirin EC 81 MG tablet Take 81 mg by mouth once.  05/23/09   [provider]  atorvastatin (LIPITOR) 40 MG tablet Take 1 tablet (40 mg total) by mouth daily. 06/20/17   Plonk, Gwyndolyn Saxon, MD  tamsulosin (FLOMAX) 0.4 MG CAPS capsule Take 1 capsule (0.4 mg total) by mouth daily. 06/19/17   Plonk, Gwyndolyn Saxon, MD    Allergies Shellfish allergy and Codeine  Family History  Problem Relation Age of Onset  . Hypertension Mother   . Hypertension Father     Social History Social History   Tobacco Use  . Smoking status: Former Smoker    Last attempt to quit: 07/23/1998    Years since quitting: 19.0  . Smokeless tobacco: Never Used  Substance Use Topics  . Alcohol use: Yes    Alcohol/week: 0.0 oz    Comment: OCC  . Drug use: No    Review of Systems Constitutional: No fever/chills Eyes: No visual changes. ENT: No sore throat. Cardiovascular: Denies chest pain. Respiratory: Denies shortness of breath. Gastrointestinal: No abdominal pain.  No nausea, no vomiting.  No diarrhea.  No constipation. Genitourinary: Negative for dysuria. Musculoskeletal: Negative for neck pain.  Negative for back pain. Integumentary: Negative for rash. Neurological: Negative  for headaches, focal weakness or numbness.   ____________________________________________   PHYSICAL EXAM:  VITAL SIGNS: ED Triage Vitals  Enc Vitals Group     BP 07/18/17 0659 (!) 159/97     Pulse Rate 07/18/17 0655 84     Resp 07/18/17 0655 (!) 26     Temp --      Temp src --      SpO2 07/18/17 0655 99 %     Weight --      Height --      Head Circumference --      Peak Flow --      Pain Score --      Pain Loc --      Pain  Edu? --      Excl. in [place]? --     Constitutional: Alert and oriented. Well appearing and in no acute distress. Eyes: Conjunctivae are normal. Head: Atraumatic. Mouth/Throat: Mucous membranes are moist.  Oropharynx non-erythematous. Neck: No stridor.  Cardiovascular: Normal rate, regular rhythm. Good peripheral circulation. Grossly normal heart sounds. Respiratory: Normal respiratory effort.  No retractions. Lungs CTAB. Gastrointestinal: Soft and nontender. No distention.  Musculoskeletal: No lower extremity tenderness nor edema. No gross deformities of extremities. Neurologic:  Normal speech and language. No gross focal neurologic deficits are appreciated.  Skin:  Skin is warm, dry and intact. No rash noted. Psychiatric: Mood and affect are normal. Speech and behavior are normal.  ____________________________________________   LABS (all labs ordered are listed, but only abnormal results are displayed)  Labs Reviewed  CBC WITH DIFFERENTIAL/PLATELET  PROTIME-INR  APTT  COMPREHENSIVE METABOLIC PANEL  TROPONIN I  LIPID PANEL   ____________________________________________  EKG  ED ECG REPORT I, Tetlin N Kaiyah Eber, the attending physician, personally viewed and interpreted this ECG.   Date: 07/18/2017  EKG Time: 6:53 AM  Rate: 96  Rhythm: Normal sinus rhythm  Axis: Normal  Intervals: Normal  ST&T Change: 2 mm ST segment elevation 2 3 aVF with ST segment depression in 1 aVL.  In addition ST segment elevation V3   PROCEDURES  Critical Care performed: CRITICAL CARE Performed by: Gregor Hams   Total critical care time: 30 minutes  Critical care time was exclusive of separately billable procedures and treating other patients.  Critical care was necessary to treat or prevent imminent or life-threatening deterioration.  Critical care was time spent personally by me on the following activities: development of treatment plan with patient and/or surrogate as well as  nursing, discussions with consultants, evaluation of patient's response to treatment, examination of patient, obtaining history from patient or surrogate, ordering and performing treatments and interventions, ordering and review of laboratory studies, ordering and review of radiographic studies, pulse oximetry and re-evaluation of patient's condition.  Procedures   ____________________________________________   INITIAL IMPRESSION / ASSESSMENT AND PLAN / ED COURSE  As part of my medical decision making, I reviewed the following data within the electronic MEDICAL RECORD [id number] year old male presented with above-stated history and physical exam consistent with ST segment elevation inferior MI.  Shortly after arrival to the emergency department patient had another episode of ventricular fibrillation requiring defibrillation in the emergency department.  Patient returned to normal sinus rhythm following defibrillation.  Dr. Audelia Acton presented to the emergency department within 2 minutes after the patient's arrival to the ED ____________________________________________  FINAL CLINICAL IMPRESSION(S) / ED DIAGNOSES  Final diagnoses:  Acute myocardial infarction of inferior wall ([place])     MEDICATIONS GIVEN DURING THIS VISIT:  Medications  heparin injection 4,000 Units (4,000 Units Intravenous Given 07/18/17 9753)     ED Discharge Orders    None       Note:  This document was prepared using Dragon voice recognition software and may include unintentional dictation errors.    Gregor Hams, MD 07/18/17 [phone]

## 2017-07-18 NOTE — Consult Note (Signed)
Cardiology Consultation:   Patient ID: ZEPH RIEBEL; [id number]; 1950/10/25   Admit date: 07/18/2017 Date of Consult: 07/18/2017  Primary Care Provider: Adline Potter, MD Primary Cardiologist: Formerly, Dr. Yvone Neu   Patient Profile:   Todd Weaver is a 66 y.o. male with a hx of "mild strokes" x 2 ~ 8-10 years prior, HTN, HLD, prior tobacco abuse, ED, back pain, BPH, and mild cognitive impairment who is being seen today for the evaluation of inferior ST elevation MI at the request of Dr. Vianne Bulls.  History of Present Illness:   Todd Weaver was previously seen by Dr. Yvone Neu for hypertension and bradycardia. He was initially seen in 02/2016 for evaluation of fatigue, weakness, and SOB. Heart rate noted to be 49 at initial visit on atenolol. Beta blocker was reduced. He underwent nuclear stress testing in 03/2016 that showed a small defect of mild severity in the apex location, partially reversible and suspected to be due to artifact given intense GI uptake, EF 55-65%. Overall, this was a low risk study. Echo in 03/2016 showed an EF of 55-60%, normal wall motion, normal LV diastolic function, no significant valvular abnormalities. He was last seen on 09/25/2016 for follow up and was doing well at that time, improved off beta blocker.   He recently established with a new PCP in late 05/2017 with routine labs being unrevealing outside of an LDL of 155. His Lipitor was increased to 40 mg daily at that time. He most recently saw his PCP on 12/26 for constipation, allergies, and cerumen impaction.   He presented to [facility] ED on 12/27 via EMS with chest pain and a code STEMI from the field. Patient developed sudden onset of chest pain at 4:30 this morning while chopping wood. Pain radiated to the left arm and was 10/10 and associated with dyspnea and diaphoresis. White en route via EMS he had an episode of VF requiring defibrillation x 1 with return of NSR. Upon his arrival to [facility] ED he denied any  chest pain. EKG in the ED showed WAP with inferior ST elvation MI with 2 mm ST elevation in II, III, aVF, V3-V4 with reciprocal 1 mm ST depression in aVL and V6. Labs showed an initial troponin of 0.04, K+ 3.1, SCr 0.90, glucose 149, WBC 9.0, HGB 14.1, PLT 301, LDL 129. Shortly after arrival to the ED he had a second episode of VF requiring defibrillation with return to NSR. Cardiology arrived to the ED within 2 minutes of the patient's arrival to the ED and he was taken emergently to the cardiac cath lab with successful PCI/DES to the RCA x 2. Details to follow in the cardiac cath report.    Past Medical History:  Diagnosis Date  . Acute ST elevation myocardial infarction (STEMI) of inferior wall ([place]) 07/18/2017  . CAD (coronary artery disease)    a. nuc stress test 9/17: small defect of mild severity in the apex location, partially reversible and suspected to be due to artifact given intense GI uptake, EF 55-65%, low risk study; b. inferior STEMI 07/18/17  . GERD (gastroesophageal reflux disease)   . Hemorrhoid   . Hyperlipidemia   . Hypertension   . Stroke [facility]) 2011   "2 MINI STROKES"    Past Surgical History:  Procedure Laterality Date  . ANAL FISTULECTOMY N/A 12/21/2014   Procedure: FISTULECTOMY ANAL;  Surgeon: Christene Lye, MD;  Location: [facility] ORS;  Service: General;  Laterality: N/A;  . APPENDECTOMY  1975  .  HEMORRHOID SURGERY N/A 12/21/2014   Procedure: HEMORRHOIDECTOMY;  Surgeon: Christene Lye, MD;  Location: [facility] ORS;  Service: General;  Laterality: N/A;  . KNEE SURGERY Right age 58  . ROTATOR CUFF REPAIR Right 2013     Home Meds: Prior to Admission medications   Medication Sig Start Date End Date Taking? Authorizing Provider  amLODipine (NORVASC) 5 MG tablet Take 1 tablet (5 mg total) by mouth daily. 06/26/16 07/17/17  Wende Bushy, MD  aspirin EC 81 MG tablet Take 81 mg by mouth once.  05/23/09   [provider]  atorvastatin (LIPITOR) 40 MG tablet  Take 1 tablet (40 mg total) by mouth daily. 06/20/17   Plonk, Gwyndolyn Saxon, MD  tamsulosin (FLOMAX) 0.4 MG CAPS capsule Take 1 capsule (0.4 mg total) by mouth daily. 06/19/17   Adline Potter, MD    Inpatient Medications: Scheduled Meds: . aspirin  81 mg Oral Daily  . aspirin EC  81 mg Oral Daily  . atorvastatin  40 mg Oral q1800  . atorvastatin  80 mg Oral q1800  . [START ON 07/19/2017] enoxaparin (LOVENOX) injection  40 mg Subcutaneous Q24H  . potassium chloride  40 mEq Oral Once  . sodium chloride flush  3 mL Intravenous Q12H  . ticagrelor  90 mg Oral BID   Continuous Infusions: . sodium chloride    . sodium chloride    . norepinephrine (LEVOPHED) Adult infusion     PRN Meds:   Allergies:   Allergies  Allergen Reactions  . Shellfish Allergy Hives    swelling  . Codeine     Social History:   Social History   Socioeconomic History  . Marital status: Married    Spouse name: Not on file  . Number of children: Not on file  . Years of education: Not on file  . Highest education level: Not on file  Social Needs  . Financial resource strain: Not on file  . Food insecurity - worry: Not on file  . Food insecurity - inability: Not on file  . Transportation needs - medical: Not on file  . Transportation needs - non-medical: Not on file  Occupational History  . Not on file  Tobacco Use  . Smoking status: Former Smoker    Last attempt to quit: 07/23/1998    Years since quitting: 19.0  . Smokeless tobacco: Never Used  Substance and Sexual Activity  . Alcohol use: Yes    Alcohol/week: 0.0 oz    Comment: OCC  . Drug use: No  . Sexual activity: Not on file  Other Topics Concern  . Not on file  Social History Narrative  . Not on file     Family History:   Family History  Problem Relation Age of Onset  . Hypertension Mother   . Hypertension Father   . Alzheimer's disease Father     ROS:  Review of Systems  Constitutional: Positive for diaphoresis and  malaise/fatigue. Negative for chills, fever and weight loss.  HENT: Negative for congestion.   Eyes: Negative for discharge and redness.  Respiratory: Positive for shortness of breath. Negative for cough, hemoptysis, sputum production and wheezing.   Cardiovascular: Positive for chest pain. Negative for palpitations, orthopnea, claudication, leg swelling and PND.  Gastrointestinal: Negative for abdominal pain, blood in stool, heartburn, melena, nausea and vomiting.  Genitourinary: Negative for hematuria.  Musculoskeletal: Negative for falls and myalgias.  Skin: Negative for rash.  Neurological: Negative for dizziness, tingling, tremors, sensory change, speech change, focal weakness,  loss of consciousness and weakness.  Endo/Heme/Allergies: Does not bruise/bleed easily.  Psychiatric/Behavioral: Negative for substance abuse. The patient is not nervous/anxious.   All other systems reviewed and are negative.     Physical Exam/Data:   Vitals:   07/18/17 0655 07/18/17 0659 07/18/17 0710  BP:  (!) 159/97   Pulse: 84    Resp: (!) 26    SpO2: 99%  100%   No intake or output data in the 24 hours ending 07/18/17 0859 There were no vitals filed for this visit. There is no height or weight on file to calculate BMI.   Physical Exam: General: Well developed, well nourished, in no acute distress. Head: Normocephalic, atraumatic, sclera non-icteric, no xanthomas, nares without discharge.  Neck: Negative for carotid bruits. JVD not elevated. Lungs: Clear bilaterally to auscultation without wheezes, rales, or rhonchi. Breathing is unlabored. Heart: RRR with S1 S2. No murmurs, rubs, or gallops appreciated. Abdomen: Soft, non-tender, non-distended with normoactive bowel sounds. No hepatomegaly. No rebound/guarding. No obvious abdominal masses. Msk:  Strength and tone appear normal for age. Extremities: No clubbing or cyanosis. No edema. Distal pedal pulses are 2+ and equal bilaterally. Neuro: Alert  and oriented X 3. No facial asymmetry. No focal deficit. Moves all extremities spontaneously. Psych:  Responds to questions appropriately with a normal affect.   EKG:  The EKG was personally reviewed and demonstrates: WAP with inferior ST elvation MI with 2 mm ST elevation in II, III, aVF, V3-V4 with reciprocal 1 mm ST depression in aVL and V6 Telemetry:  Telemetry was personally reviewed and demonstrates: NSR  Weights: There were no vitals filed for this visit.  Relevant CV Studies: Myoview 03/2016:  There was no ST segment deviation noted during stress.  No T wave inversion was noted during stress.  This is a low risk study.  The left ventricular ejection fraction is normal (55-65%).  Defect 1: There is a small defect of mild severity present in the apex location. This is patrially reversible and suspected to be due to an artifact given intense GI uptake which affected the quality of study. Correlate clinically  TTE 03/2016: Study Conclusions  - Left ventricle: The cavity size was normal. Wall thickness was   normal. Systolic function was normal. The estimated ejection   fraction was in the range of 55% to 60%. Wall motion was normal;   there were no regional wall motion abnormalities. Left   ventricular diastolic function parameters were normal.  Impressions:  - Normal study.  LHC 07/18/2017: pending  Laboratory Data:  Chemistry Recent Labs  Lab 07/18/17 0645  NA 138  K 3.1*  CL 107  CO2 18*  GLUCOSE 149*  BUN 15  CREATININE 0.90  CALCIUM 9.2  GFRNONAA >60  GFRAA >60  ANIONGAP 13    Recent Labs  Lab 07/18/17 0645  PROT 7.5  ALBUMIN 4.1  AST 33  ALT 31  ALKPHOS 67  BILITOT 1.1   Hematology Recent Labs  Lab 07/18/17 0645  WBC 9.0  RBC 4.99  HGB 14.1  HCT 41.9  MCV 83.9  MCH 28.3  MCHC 33.7  RDW 15.1*  PLT 301   Cardiac Enzymes Recent Labs  Lab 07/18/17 0645  TROPONINI 0.04*   No results for input(s): TROPIPOC in the last 168  hours.  BNPNo results for input(s): BNP, PROBNP in the last 168 hours.  DDimer No results for input(s): DDIMER in the last 168 hours.  Radiology/Studies:  No results found.  Assessment and Plan:  1. Inferior ST elevation MI complicated by VF arrest: -Status post successful PCI/DES x 2 to the RCA -Currently, chest pain free -Initial troponin 0.04, continue to cycle until peak -Check echo -DAPT with ASA and Brilinta -Start Coreg 3.125 mg bid as BP allows (careful monitoring of heart rate given prior history of bradycardia on beta blocker) -Increase Lipitor to 80 mg daily -Admit to CCU -CXR -Check A1c for further risk stratification  -Check magnesium with recommendation to replete to goal > 2.0 -Replete potassium to goal > 4.0 -Cardiac rehab  2. HTN: -Currently on Levophed -Monitor  3. HLD: -Increase Lipitor to 80 mg daily -Goal LDL < 70 -Recheck as outpatient in 6-8 weeks, if not at goal consider addition of Zetia vs PCSK9 inhibitor  4. Hypokalemia: -Replete to goal of > 4.0 -Check magnesium as above with recommendation to replete to goal > 2.0  5. Hyperglycemia: -Check A1c   For questions or updates, please contact [place] Please consult [email or web address] for contact info under Cardiology/STEMI.   Signed, [name], PA-C [place] Pager: [phone] 07/18/2017, 8:59 AM

## 2017-07-18 NOTE — Care Management (Signed)
Brilinta voucher provided to patient.

## 2017-07-18 NOTE — Progress Notes (Signed)
*  PRELIMINARY RESULTS* Echocardiogram 2D Echocardiogram has been performed.  Todd Weaver 07/18/2017, 10:07 AM

## 2017-07-18 NOTE — Progress Notes (Signed)
1330  Dr. Fletcher Anon in to see patient. Given extra 2.5 mg of Metoprolol IVP per verbal order 1345 No results from metoprolol. After 150mg  bolous started on Amiodorone drip at 60mg /hr.

## 2017-07-18 NOTE — Progress Notes (Signed)
Patient states chest pain is better. Resting sleepily after Morphine. Remains anxious.

## 2017-07-19 DIAGNOSIS — I200 Unstable angina: Secondary | ICD-10-CM

## 2017-07-19 DIAGNOSIS — I4901 Ventricular fibrillation: Secondary | ICD-10-CM

## 2017-07-19 LAB — CBC
HEMATOCRIT: 39.7 % — AB (ref 40.0–52.0)
HEMOGLOBIN: 13.1 g/dL (ref 13.0–18.0)
MCH: 27.9 pg (ref 26.0–34.0)
MCHC: 33.1 g/dL (ref 32.0–36.0)
MCV: 84.2 fL (ref 80.0–100.0)
Platelets: 265 10*3/uL (ref 150–440)
RBC: 4.72 MIL/uL (ref 4.40–5.90)
RDW: 15.2 % — AB (ref 11.5–14.5)
WBC: 11.3 10*3/uL — AB (ref 3.8–10.6)

## 2017-07-19 LAB — BASIC METABOLIC PANEL
ANION GAP: 8 (ref 5–15)
BUN: 10 mg/dL (ref 6–20)
CHLORIDE: 106 mmol/L (ref 101–111)
CO2: 21 mmol/L — ABNORMAL LOW (ref 22–32)
Calcium: 8.7 mg/dL — ABNORMAL LOW (ref 8.9–10.3)
Creatinine, Ser: 0.88 mg/dL (ref 0.61–1.24)
GFR calc Af Amer: >60 mL/min (ref >60)
GFR calc non Af Amer: >60 mL/min (ref >60)
Glucose, Bld: 153 mg/dL — ABNORMAL HIGH (ref 65–99)
POTASSIUM: 3.2 mmol/L — AB (ref 3.5–5.1)
SODIUM: 135 mmol/L (ref 135–145)

## 2017-07-19 LAB — TROPONIN I
TROPONIN I: 17.12 ng/mL — AB (ref <0.03)
Troponin I: 25.94 ng/mL (ref <0.03)

## 2017-07-19 LAB — TSH: TSH: 1.232 u[IU]/mL (ref 0.350–4.500)

## 2017-07-19 MED ORDER — MAGNESIUM OXIDE 400 (241.3 MG) MG PO TABS
400.0000 mg | ORAL_TABLET | Freq: Two times a day (BID) | ORAL | Status: DC
  Administered 2017-07-19 – 2017-07-20 (×3): 400 mg via ORAL
  Filled 2017-07-19 (×3): qty 1

## 2017-07-19 MED ORDER — NITROGLYCERIN 0.4 MG SL SUBL
SUBLINGUAL_TABLET | SUBLINGUAL | Status: AC
  Filled 2017-07-19: qty 1

## 2017-07-19 MED ORDER — MORPHINE SULFATE (PF) 2 MG/ML IV SOLN
2.0000 mg | INTRAVENOUS | Status: DC | PRN
  Administered 2017-07-19: 2 mg via INTRAVENOUS
  Filled 2017-07-19: qty 1

## 2017-07-19 MED ORDER — NITROGLYCERIN 0.4 MG SL SUBL
0.4000 mg | SUBLINGUAL_TABLET | SUBLINGUAL | Status: DC | PRN
  Administered 2017-07-19: 0.4 mg via SUBLINGUAL

## 2017-07-19 MED ORDER — POTASSIUM CHLORIDE CRYS ER 20 MEQ PO TBCR
40.0000 meq | EXTENDED_RELEASE_TABLET | Freq: Two times a day (BID) | ORAL | Status: AC
  Administered 2017-07-19 (×2): 40 meq via ORAL
  Filled 2017-07-19 (×2): qty 2

## 2017-07-19 MED ORDER — LOSARTAN POTASSIUM 25 MG PO TABS
12.5000 mg | ORAL_TABLET | Freq: Every day | ORAL | Status: DC
  Administered 2017-07-19 – 2017-07-20 (×2): 12.5 mg via ORAL
  Filled 2017-07-19 (×2): qty 1

## 2017-07-19 MED ORDER — TAMSULOSIN HCL 0.4 MG PO CAPS
0.4000 mg | ORAL_CAPSULE | Freq: Every day | ORAL | Status: DC
  Administered 2017-07-19: 0.4 mg via ORAL
  Filled 2017-07-19: qty 1

## 2017-07-19 NOTE — Progress Notes (Signed)
    Notified patient had episode of 6/10 chest pain when being transferred from wheelchair to bed upon transfer from ICU to telemetry. Pain was left-sided and described as sore. He was given SL NTG x 1. Pain lasted ~ 30 minutes. Stat EKG was obtained that was not acute. Stat troponin is pending. Telemetry reviewed and shows NSR with rare PAC/PVC. In talking with the patient this afternoon, he is now symptom free. He notes self palpation reproduces the soreness. Palpation by myself also fully reproduces the patient's pain/soreness. This has been discussed with Dr. Fletcher Anon and we suspect this is MSK in etiology given his defibrillation x 2 and chest compressions. He will notify us if the pain returns.

## 2017-07-19 NOTE — Progress Notes (Signed)
The patient was complaining of left sided chest pain of 6/10 on a pain scale. Dr. Jerelyn Charles notified with a new order for Morphine 2 mg IV every 2 hours as needed chest pain. Morphine 2 mg  administered  and it was effective. Will continue to monitor.

## 2017-07-19 NOTE — Progress Notes (Signed)
Patient ID: Todd Weaver, male   DOB: 1951/02/21, 66 y.o.   MRN: [id number]  [facility] PROGRESS NOTE  Todd Weaver XNA:[id number] DOB: [birth year] DOA: 07/18/2017 PCP: Adline Potter, MD  HPI/Subjective: Patient seen at 12 noon.  Patient was feeling better at that time.  Did have some soreness in his left chest.  Upon transferring over telemetry he told the nurse there that he had a 6 out of 10 pain in his chest.  He was given sublingual nitroglycerin and cardiology was contacted.  Objective: Vitals:   07/19/17 1300 07/19/17 1404  BP: 113/84 119/69  Pulse: 64 81  Resp: (!) 21 20  Temp:  97.8 F (36.6 C)  SpO2: 99% 100%    Filed Weights   07/18/17 0900  Weight: 85.2 kg (187 lb 13.3 oz)    ROS: Review of Systems  Constitutional: Negative for chills and fever.  Eyes: Negative for blurred vision.  Respiratory: Negative for cough and shortness of breath.   Cardiovascular: Positive for chest pain.  Gastrointestinal: Negative for abdominal pain, constipation, diarrhea, nausea and vomiting.  Genitourinary: Negative for dysuria.  Musculoskeletal: Negative for joint pain.  Neurological: Negative for dizziness and headaches.   Exam: Physical Exam  Constitutional: He is oriented to person, place, and time.  HENT:  Nose: No mucosal edema.  Mouth/Throat: No oropharyngeal exudate or posterior oropharyngeal edema.  Eyes: Conjunctivae, EOM and lids are normal. Pupils are equal, round, and reactive to light.  Neck: No JVD present. Carotid bruit is not present. No edema present. No thyroid mass and no thyromegaly present.  Cardiovascular: S1 normal and S2 normal. Exam reveals no gallop.  No murmur heard. Pulses:      Dorsalis pedis pulses are 2+ on the right side, and 2+ on the left side.  Respiratory: No respiratory distress. He has no wheezes. He has no rhonchi. He has no rales.  GI: Soft. Bowel sounds are normal. There is no tenderness.  Musculoskeletal:       Right ankle:  He exhibits no swelling.       Left ankle: He exhibits no swelling.  Lymphadenopathy:    He has no cervical adenopathy.  Neurological: He is alert and oriented to person, place, and time. No cranial nerve deficit.  Skin: Skin is warm. No rash noted. Nails show no clubbing.  Psychiatric: He has a normal mood and affect.      Data Reviewed: Basic Metabolic Panel: Recent Labs  Lab 07/18/17 0645 07/18/17 1004 07/19/17 0546  NA 138  --  135  K 3.1*  --  3.2*  CL 107  --  106  CO2 18*  --  21*  GLUCOSE 149*  --  153*  BUN 15  --  10  CREATININE 0.90  --  0.88  CALCIUM 9.2  --  8.7*  MG  --  1.7  --    Liver Function Tests: Recent Labs  Lab 07/18/17 0645  AST 33  ALT 31  ALKPHOS 67  BILITOT 1.1  PROT 7.5  ALBUMIN 4.1   CBC: Recent Labs  Lab 07/18/17 0645 07/19/17 0546  WBC 9.0 11.3*  NEUTROABS 3.0  --   HGB 14.1 13.1  HCT 41.9 39.7*  MCV 83.9 84.2  PLT 301 265   Cardiac Enzymes: Recent Labs  Lab 07/18/17 0645 07/18/17 1004 07/18/17 1531 07/18/17 1958 07/19/17 0735  TROPONINI 0.04* 9.30* 43.86* 44.68* 25.94*    CBG: Recent Labs  Lab 07/18/17 0859 07/18/17 1156 07/18/17 1617  GLUCAP 151* 114* 130*    Recent Results (from the past 240 hour(s))  MRSA PCR Screening     Status: None   Collection Time: 07/18/17 10:04 AM  Result Value Ref Range Status   MRSA by PCR NEGATIVE NEGATIVE Final    Comment:        The GeneXpert MRSA Assay (FDA approved for NASAL specimens only), is one component of a comprehensive MRSA colonization surveillance program. It is not intended to diagnose MRSA infection nor to guide or monitor treatment for MRSA infections. Performed at [facility], [address] [place], [place] [address]      Studies: Dg Chest [facility] 1 View  Result Date: 07/18/2017 CLINICAL DATA:  Chest pain EXAM: PORTABLE CHEST 1 VIEW COMPARISON:  02/16/2016 FINDINGS: Mild cardiac enlargement. No pleural effusion or edema identified. No  airspace opacities identified. IMPRESSION: No active disease. Electronically Signed   By: Kerby Moors M.D.   On: 07/18/2017 10:05    Scheduled Meds: . aspirin EC  81 mg Oral Daily  . atorvastatin  80 mg Oral q1800  . enoxaparin (LOVENOX) injection  40 mg Subcutaneous Q24H  . losartan  12.5 mg Oral Daily  . magnesium oxide  400 mg Oral BID  . metoprolol tartrate  25 mg Oral BID  . potassium chloride  40 mEq Oral BID  . sodium chloride flush  3 mL Intravenous Q12H  . ticagrelor  90 mg Oral BID   Continuous Infusions: . sodium chloride      Assessment/Plan:  1. STEMI with ventricular fibrillation arrest.  Patient was defibrillated by EMS and again in the ER.  The patient was taken urgently to the cardiac catheterization lab on 07/18/2017 and had 2 stents placed.  Patient is on aspirin, Brilinta, atorvastatin and metoprolol.  Patient also started on low-dose losartan secondary to ejection fraction of 45-50%. 2. Hyperlipidemia unspecified on Lipitor.  LDL 129 and goal less than 70 3. Hypokalemia potassium replaced orally 4. Hypomagnesemia oral magnesium replacement 5. Impaired fasting glucose.  Hemoglobin A1c 5.8.  Patient not a diabetic at this time. 6. Essential hypertension on metoprolol and losartan 7. Transient atrial fibrillation converted to normal sinus rhythm after amiodarone drip and this was discontinued.  Code Status:     Code Status Orders  (From admission, onward)        Start     Ordered   07/18/17 0900  Full code  Continuous     07/18/17 0859    Code Status History    Date Active Date Inactive Code Status Order ID Comments User Context   This patient has a current code status but no historical code status.     Family Communication: Family at the bedside Disposition Plan: Early as potential disposition will be tomorrow depending on how he feels when he ambulates.  Consultants:  Cardiology  Procedures:  Cardiac catheterization  Time spent: 25  minutes  [place]

## 2017-07-19 NOTE — Progress Notes (Signed)
Uncomplicated course after LHC.  Discussed with Dr. Rockey Situ.  I have placed transfer to telemetry floor.  After transfer, PCCM will sign off. Please call if we can be of further assistance  Merton Border, MD PCCM service Mobile [phone] Pager [phone] 07/19/2017 1:31 PM

## 2017-07-19 NOTE — Progress Notes (Signed)
Patient presented to hospital on 07/18/2017 with STEMI with Ventricular Fibrillation Arrest via EMS.  Defibrillated by EMS and in the ER. Patient taken to cath lab where he underwent successful PCI/DES x 2 to RCA.  Patient has residual ostial LAD stenosis.  EF 45% with inferior wall hypokinesis.  While in ICU patient had new onset of A-fib with RVR and was on Amiodarone gtt.  Patient has since converted to NSR.  Patient has hx of previous mild stroke, HTN, HLD, and prior tobacco use.    Patient currently on the following cardiac meds:  ASA, Brilinta, Lipitor, Losartan, Lopressor.    Upon entering patient's room patient sitting up in bed and talking to a room full of family members.  Patient gave this nurse permission to talk about CAD and Cardiac Rehab in front of his family members.    "Heart Attack Bouncing Back" booklet given and reviewed with patient and family. Discussed the definition of CAD. Reviewed the location CAD and where stents were placed. Informed patient he will be given a  stent card. Explained the purpose of the stent card. Instructed patient to keep stent card in his wallet.  ? Discussed modifiable risk factors including controlling blood pressure, cholesterol, and blood sugar; following heart healthy diet; maintaining healthy weight; exercise; and smoking cessation, if applicable. ?Note: Patient is a former smoker.  ? Discussed cardiac medications including rationale for taking, mechanisms of action, and side effects. Stressed the importance of taking medications as prescribed.  ? Discussed emergency plan for heart attack symptoms. Patient verbalized understanding of need to call 911 and not to drive herself to ER if having cardiac symptoms / chest pain.  ? Heart healthy diet of low sodium, low fat, low cholesterol heart healthy diet discussed. Information on diet provided. Dietitian has been in to see patient to provide education.  ? Exercise - Benefits of exercise discussed.   Explained to patient and family patient had been referred to Cardiac Rehab. Overview of the program provided. Patient informed this RN that he wants to participate in the program.  Informed patient that the [place] will be contacting him to schedule an appointment for orientation.  Orientation packet given to patient.  Patient instructed to complete as much of the packet prior to the appointment.  Patient verbalized understanding.    Patient and family thanked this RN for providing the above information.   ? ? Roanna Epley, RN, BSN, [facility] Cardiovascular and Pulmonary Nurse Navigator ?

## 2017-07-19 NOTE — Progress Notes (Addendum)
Pt transferred from ICU. Pt transferred from wheelchair to bed and developed 6/10 chest pain on the left side that is mostly soreness. Administered one SL nitro, pt states the pain is getting better but still rates it at 6/10. Made MD Trilby aware. Paged cardio ([facility]oll[facility]) and awaiting response.   Update- Spoke with Thurmond Butts from cardiology and updated him on pt status. Orders for stat EKG and troponin received. He will be coming to the floor to see the pt soon.

## 2017-07-20 DIAGNOSIS — R0602 Shortness of breath: Secondary | ICD-10-CM

## 2017-07-20 DIAGNOSIS — I480 Paroxysmal atrial fibrillation: Secondary | ICD-10-CM

## 2017-07-20 LAB — BASIC METABOLIC PANEL WITH GFR
Anion gap: 7 (ref 5–15)
BUN: 11 mg/dL (ref 6–20)
CO2: 24 mmol/L (ref 22–32)
Calcium: 8.9 mg/dL (ref 8.9–10.3)
Chloride: 105 mmol/L (ref 101–111)
Creatinine, Ser: 0.98 mg/dL (ref 0.61–1.24)
GFR calc Af Amer: >60 mL/min
GFR calc non Af Amer: >60 mL/min
Glucose, Bld: 115 mg/dL — ABNORMAL HIGH (ref 65–99)
Potassium: 3.8 mmol/L (ref 3.5–5.1)
Sodium: 136 mmol/L (ref 135–145)

## 2017-07-20 LAB — MAGNESIUM: MAGNESIUM: 1.9 mg/dL (ref 1.7–2.4)

## 2017-07-20 MED ORDER — MAGNESIUM OXIDE 400 (241.3 MG) MG PO TABS: 400.0000 mg | ORAL_TABLET | Freq: Every day | ORAL | 0 refills | Status: DC

## 2017-07-20 MED ORDER — NITROGLYCERIN 0.4 MG SL SUBL: 0.4000 mg | SUBLINGUAL_TABLET | SUBLINGUAL | 0 refills | Status: DC | PRN

## 2017-07-20 MED ORDER — MAGNESIUM SULFATE 2 GM/50ML IV SOLN
2.0000 g | Freq: Once | INTRAVENOUS | Status: AC
  Administered 2017-07-20: 2 g via INTRAVENOUS
  Filled 2017-07-20: qty 50

## 2017-07-20 MED ORDER — AMIODARONE HCL 200 MG PO TABS
400.0000 mg | ORAL_TABLET | Freq: Every day | ORAL | Status: DC
  Administered 2017-07-20: 400 mg via ORAL
  Filled 2017-07-20: qty 2

## 2017-07-20 MED ORDER — OXYCODONE HCL 5 MG PO TABS
5.0000 mg | ORAL_TABLET | Freq: Four times a day (QID) | ORAL | Status: DC | PRN
Start: 2017-07-20 — End: 2017-07-20

## 2017-07-20 MED ORDER — POTASSIUM CHLORIDE CRYS ER 20 MEQ PO TBCR
20.0000 meq | EXTENDED_RELEASE_TABLET | Freq: Once | ORAL | Status: AC
  Administered 2017-07-20: 20 meq via ORAL
  Filled 2017-07-20: qty 1

## 2017-07-20 MED ORDER — ATORVASTATIN CALCIUM 80 MG PO TABS: 80.0000 mg | ORAL_TABLET | Freq: Every day | ORAL | 0 refills | Status: DC

## 2017-07-20 MED ORDER — POTASSIUM CHLORIDE CRYS ER 20 MEQ PO TBCR
40.0000 meq | EXTENDED_RELEASE_TABLET | Freq: Once | ORAL | Status: AC
  Administered 2017-07-20: 40 meq via ORAL
  Filled 2017-07-20: qty 2

## 2017-07-20 MED ORDER — LOSARTAN POTASSIUM 25 MG PO TABS: 12.5000 mg | ORAL_TABLET | Freq: Every day | ORAL | 0 refills | Status: DC

## 2017-07-20 MED ORDER — TICAGRELOR 90 MG PO TABS: 90.0000 mg | ORAL_TABLET | Freq: Two times a day (BID) | ORAL | 0 refills | Status: DC

## 2017-07-20 MED ORDER — METOPROLOL TARTRATE 25 MG PO TABS: 25.0000 mg | ORAL_TABLET | Freq: Two times a day (BID) | ORAL | 0 refills | Status: DC

## 2017-07-20 MED ORDER — AMIODARONE HCL 200 MG PO TABS: ORAL_TABLET | ORAL | 0 refills | Status: DC

## 2017-07-20 MED ORDER — FUROSEMIDE 10 MG/ML IJ SOLN
20.0000 mg | Freq: Once | INTRAMUSCULAR | Status: AC
  Administered 2017-07-20: 20 mg via INTRAVENOUS
  Filled 2017-07-20: qty 2

## 2017-07-20 NOTE — Progress Notes (Signed)
Progress Note  Patient Name: Todd Weaver Date of Encounter: 07/20/2017  Primary Cardiologist: [facility]  Subjective   Ambulating this morning, denies any chest discomfort Yesterday had chest discomfort on movement felt to be musculoskeletal Has shortness of breath when laying supine overnight Appetite good, tolerating medications  Blood pressure stable Telemetry reviewed showing normal sinus rhythm   Inpatient Medications    Scheduled Meds: . amiodarone  400 mg Oral Daily  . aspirin EC  81 mg Oral Daily  . atorvastatin  80 mg Oral q1800  . enoxaparin (LOVENOX) injection  40 mg Subcutaneous Q24H  . losartan  12.5 mg Oral Daily  . magnesium oxide  400 mg Oral BID  . metoprolol tartrate  25 mg Oral BID  . sodium chloride flush  3 mL Intravenous Q12H  . tamsulosin  0.4 mg Oral QPC supper  . ticagrelor  90 mg Oral BID   Continuous Infusions: . sodium chloride     PRN Meds: sodium chloride, acetaminophen, nitroGLYCERIN, ondansetron (ZOFRAN) IV, oxyCODONE, sodium chloride flush   Vital Signs    Vitals:   07/19/17 1404 07/19/17 1934 07/20/17 0410 07/20/17 0741  BP: 119/69 111/65 114/75 110/64  Pulse: 81 71 (!) 59 62  Resp: 20 17 17 18   Temp: 97.8 F (36.6 C) 98.6 F (37 C) 97.9 F (36.6 C) 97.9 F (36.6 C)  TempSrc: Oral Oral Oral Oral  SpO2: 100% 100% 100% 97%  Weight:      Height:        Intake/Output Summary (Last 24 hours) at 07/20/2017 1413 Last data filed at 07/20/2017 1110 Gross per 24 hour  Intake 663.6 ml  Output 1150 ml  Net -486.4 ml   Filed Weights   07/18/17 0900  Weight: 187 lb 13.3 oz (85.2 kg)    Telemetry    Normal sinus rhythm- Personally Reviewed  ECG     Physical Exam   GEN: No acute distress.   Neck: No JVD Cardiac: RRR, no murmurs, rubs, or gallops.  Respiratory: Clear to auscultation bilaterally. GI: Soft, nontender, non-distended  MS: No edema; No deformity. Neuro:  Nonfocal  Psych: Normal affect   Labs      Chemistry Recent Labs  Lab 07/18/17 0645 07/19/17 0546 07/20/17 0533  NA 138 135 136  K 3.1* 3.2* 3.8  CL 107 106 105  CO2 18* 21* 24  GLUCOSE 149* 153* 115*  BUN 15 10 11   CREATININE 0.90 0.88 0.98  CALCIUM 9.2 8.7* 8.9  PROT 7.5  --   --   ALBUMIN 4.1  --   --   AST 33  --   --   ALT 31  --   --   ALKPHOS 67  --   --   BILITOT 1.1  --   --   GFRNONAA >60 >60 >60  GFRAA >60 >60 >60  ANIONGAP 13 8 7      Hematology Recent Labs  Lab 07/18/17 0645 07/19/17 0546  WBC 9.0 11.3*  RBC 4.99 4.72  HGB 14.1 13.1  HCT 41.9 39.7*  MCV 83.9 84.2  MCH 28.3 27.9  MCHC 33.7 33.1  RDW 15.1* 15.2*  PLT 301 265    Cardiac Enzymes Recent Labs  Lab 07/18/17 1531 07/18/17 1958 07/19/17 0735 07/19/17 1554  TROPONINI 43.86* 44.68* 25.94* 17.12*   No results for input(s): TROPIPOC in the last 168 hours.   BNPNo results for input(s): BNP, PROBNP in the last 168 hours.   DDimer No results for  input(s): DDIMER in the last 168 hours.   Radiology    No results found.  Cardiac Studies    Patient Profile     66 year old male with  history of previous mild stroke, hypertension, hyperlipidemia and prior tobacco use who presented with acute onset of chest pain with EKG in the field showing evidence of inferior ST elevation myocardial infarction. Code STEMI was activated but the patient suffered ventricular fibrillation en route which was treated successfully with defibrillation. In the ED, he developed another episode of ventricular fibrillation and was again treated with 1 shock. Taken to the cath lab with successful PCI/DES x 2 to RCA with residual LAD disease. In the ICU, he developed new onset Afib with RVR with conversion to NSR at 22:40.     Assessment & Plan     1. Inferior ST elevation myocardial infarction complicated by ventricular fibrillation arrest:   PCI and 2 drug-eluting stent placement to the right coronary artery.  Residual ostial LAD stenosis, unclear if  this will require intervention in the future.  consider repeat angiography in few months with IVIS and FFR for evaluation. -Continue dual antiplatelet therapy for at least one year -Cardiac rehab recommended Ambulating without significant symptoms  2. Ischemic cardiomyopathy:   EF of 45% with inferior wall hypokinesis -Lopressor 25 mg bid,  losartan 12.5 mg daily Long discussion concerning CHF education Reported having shortness of breath when lying supine will give 1 dose of Lasix 20 mg IV with potassium  3.    Arrhythmia /new onset atrial fibrillation/VF -Developed atrial fibrillation in the ICU after stent placement  Also had VF requiring defibrillation Converted on amiodarone infusion Started on oral amiodarone  avoid addition of anticoagulation as this would place him on triple therapy  4. Dispo: Cardiac rehab   Total encounter time more than 25 minutes  Greater than 50% was spent in counseling and coordination of care with the patient   For questions or updates, please contact [place] Please consult [email or web address] for contact info under Cardiology/STEMI.      Signed, Ida Rogue, MD  07/20/2017, 2:13 PM

## 2017-07-20 NOTE — Plan of Care (Signed)
Patient and wife well informed and future health needs.

## 2017-07-20 NOTE — Progress Notes (Signed)
Patient ID: Todd Weaver, male   DOB: 1950/08/12, 66 y.o.   MRN: [id number] [place] at [place] was admitted to the [facility] on 07/18/2017 and Discharged  07/20/2017 and should be excused from work/school   for 9 days starting 07/18/2017 ,  Will need Doctors note to return to work  Loletha Grayer M.D on 07/20/2017,at 9:58 AM  [place] [facility]  [phone]

## 2017-07-20 NOTE — Progress Notes (Signed)
Discharged to home with wife.  Reviewed new medication and gave him education sheets for each.  Instructed him to keep the Nitrostat and four  81 mg ASA on him at all times.  Stent card given to him.

## 2017-07-20 NOTE — Discharge Summary (Signed)
[facility] [place] NAME: Todd Weaver    MR#:  [id number]  DATE OF BIRTH:  [birth year]  DATE OF ADMISSION:  07/18/2017 ADMITTING PHYSICIAN: Loletha Grayer, MD  DATE OF DISCHARGE: 07/20/2017  1:24 PM  PRIMARY CARE PHYSICIAN: Adline Potter, MD    ADMISSION DIAGNOSIS:  STEMI (ST elevation myocardial infarction) ([place]) [I21.3]  DISCHARGE DIAGNOSIS:  Active Problems:   Acute myocardial infarction of inferior wall (HCC)   STEMI (ST elevation myocardial infarction) ([place])   SECONDARY DIAGNOSIS:   Past Medical History:  Diagnosis Date  . Acute ST elevation myocardial infarction (STEMI) of inferior wall ([place]) 07/18/2017  . CAD (coronary artery disease)    a. nuc stress test 9/17: small defect of mild severity in the apex location, partially reversible and suspected to be due to artifact given intense GI uptake, EF 55-65%, low risk study; b. inferior STEMI 07/18/17  . GERD (gastroesophageal reflux disease)   . Hemorrhoid   . Hyperlipidemia   . Hypertension   . Stroke [facility]) 2011   "2 MINI STROKES"    HOSPITAL COURSE:   1.  STEMI with ventricular fibrillation arrest.  Patient was defibrillated by EMS and again in the ER on presentation.  The patient was taken urgently to the cardiac catheterization lab on 07/18/2017 and had 2 stents placed.  Patient is on aspirin, Brilinta, atorvastatin and metoprolol.  Patient also started on low-dose losartan secondary to ejection fraction of 45-50%.  The patient still had some reproducible soreness which is likely secondary to chest compressions and/or defibrillation.  Close follow-up with cardiology in 1 week.  Will need a return to work note from cardiology.  I did give him out of work for 9 days. 2.  Hyperlipidemia unspecified on Lipitor.  LDL 129 and goal less than 70. 3.  Hypokalemia replace potassium while here in the hospital. 4.  Hypomagnesemia.  Oral replacement upon discharge 5.  Impaired fasting  glucose.  Hemoglobin A1c 5.8.  Patient not a diabetic at this time. 6.  Essential hypertension on metoprolol and losartan. 7.  Transient atrial fibrillation which converted to normal sinus rhythm on amiodarone drip.  Amiodarone was discontinued but then cardiology restarted upon discharge.  Prescribed amiodarone 400 mg daily for 5 days then drop down to 200 mg daily.   DISCHARGE CONDITIONS:   Satisfactory  CONSULTS OBTAINED:  Treatment Team:  Wellington Hampshire, MD  DRUG ALLERGIES:   Allergies  Allergen Reactions  . Shellfish Allergy Hives    swelling  . Codeine     DISCHARGE MEDICATIONS:   Allergies as of 07/20/2017      Reactions   Shellfish Allergy Hives   swelling   Codeine       Medication List    STOP taking these medications   amLODipine 5 MG tablet Commonly known as:  NORVASC     TAKE these medications   amiodarone 200 MG tablet Commonly known as:  PACERONE Take 2 tablets daily for five days then one tablet daily afterwards Notes to patient:  START TAKING ONE TABLE DAILY ON Thursday January 3.   aspirin EC 81 MG tablet Take 81 mg by mouth once.   atorvastatin 80 MG tablet Commonly known as:  LIPITOR Take 1 tablet (80 mg total) by mouth daily at 6 PM. What changed:    medication strength  how much to take  when to take this   losartan 25 MG tablet Commonly known as:  COZAAR Take  0.5 tablets (12.5 mg total) by mouth daily.   magnesium oxide 400 (241.3 Mg) MG tablet Commonly known as:  MAG-OX Take 1 tablet (400 mg total) by mouth daily.   metoprolol tartrate 25 MG tablet Commonly known as:  LOPRESSOR Take 1 tablet (25 mg total) by mouth 2 (two) times daily.   nitroGLYCERIN 0.4 MG SL tablet Commonly known as:  NITROSTAT Place 1 tablet (0.4 mg total) under the tongue every 5 (five) minutes as needed for chest pain.   tamsulosin 0.4 MG Caps capsule Commonly known as:  FLOMAX Take 1 capsule (0.4 mg total) by mouth daily.   ticagrelor 90 MG  Tabs tablet Commonly known as:  BRILINTA Take 1 tablet (90 mg total) by mouth 2 (two) times daily.        DISCHARGE INSTRUCTIONS:   Follow-up cardiology 1 week Follow-up PMD 2 weeks  If you experience worsening of your admission symptoms, develop shortness of breath, life threatening emergency, suicidal or homicidal thoughts you must seek medical attention immediately by calling 911 or calling your MD immediately  if symptoms less severe.  You Must read complete instructions/literature along with all the possible adverse reactions/side effects for all the Medicines you take and that have been prescribed to you. Take any new Medicines after you have completely understood and accept all the possible adverse reactions/side effects.   Please note  You were cared for by a hospitalist during your hospital stay. If you have any questions about your discharge medications or the care you received while you were in the hospital after you are discharged, you can call the unit and asked to speak with the hospitalist on call if the hospitalist that took care of you is not available. Once you are discharged, your primary care physician will handle any further medical issues. Please note that NO REFILLS for any discharge medications will be authorized once you are discharged, as it is imperative that you return to your primary care physician (or establish a relationship with a primary care physician if you do not have one) for your aftercare needs so that they can reassess your need for medications and monitor your lab values.    Today   CHIEF COMPLAINT:   Chief Complaint  Patient presents with  . Chest Pain  . Code STEMI    HISTORY OF PRESENT ILLNESS:  Todd Weaver  is a 66 y.o. male came in with left shoulder pain ventricular fibrillation arrest and found to have a STEMI   VITAL SIGNS:  Blood pressure 110/64, pulse 62, temperature 97.9 F (36.6 C), temperature source Oral, resp. rate 18,  height 5\' 7"  (1.702 m), weight 85.2 kg (187 lb 13.3 oz), SpO2 97 %.   PHYSICAL EXAMINATION:  GENERAL:  66 y.o.-year-old patient lying in the bed with no acute distress.  EYES: Pupils equal, round, reactive to light and accommodation. No scleral icterus. Extraocular muscles intact.  HEENT: Head atraumatic, normocephalic. Oropharynx and nasopharynx clear.  NECK:  Supple, no jugular venous distention. No thyroid enlargement, no tenderness.  LUNGS: Normal breath sounds bilaterally, no wheezing, rales,rhonchi or crepitation. No use of accessory muscles of respiration.  CARDIOVASCULAR: S1, S2 normal. No murmurs, rubs, or gallops.  ABDOMEN: Soft, non-tender, non-distended. Bowel sounds present. No organomegaly or mass.  EXTREMITIES: No pedal edema, cyanosis, or clubbing.  NEUROLOGIC: Cranial nerves II through XII are intact. Muscle strength 5/5 in all extremities. Sensation intact. Gait not checked.  PSYCHIATRIC: The patient is alert and oriented x 3.  SKIN: No obvious rash, lesion, or ulcer.   DATA REVIEW:   CBC Recent Labs  Lab 07/19/17 0546  WBC 11.3*  HGB 13.1  HCT 39.7*  PLT 265    Chemistries  Recent Labs  Lab 07/18/17 0645  07/20/17 0533  NA 138   < > 136  K 3.1*   < > 3.8  CL 107   < > 105  CO2 18*   < > 24  GLUCOSE 149*   < > 115*  BUN 15   < > 11  CREATININE 0.90   < > 0.98  CALCIUM 9.2   < > 8.9  MG  --    < > 1.9  AST 33  --   --   ALT 31  --   --   ALKPHOS 67  --   --   BILITOT 1.1  --   --    < > = values in this interval not displayed.    Cardiac Enzymes Recent Labs  Lab 07/19/17 1554  TROPONINI 17.12*    Microbiology Results  Results for orders placed or performed during the hospital encounter of 07/18/17  MRSA PCR Screening     Status: None   Collection Time: 07/18/17 10:04 AM  Result Value Ref Range Status   MRSA by PCR NEGATIVE NEGATIVE Final    Comment:        The GeneXpert MRSA Assay (FDA approved for NASAL specimens only), is one  component of a comprehensive MRSA colonization surveillance program. It is not intended to diagnose MRSA infection nor to guide or monitor treatment for MRSA infections. Performed at [facility], [address] [place], [place] [address]      Management plans discussed with the patient, family and they are in agreement.  CODE STATUS:     Code Status Orders  (From admission, onward)        Start     Ordered   07/18/17 0900  Full code  Continuous     07/18/17 0859    Code Status History    Date Active Date Inactive Code Status Order ID Comments User Context   This patient has a current code status but no historical code status.      TOTAL TIME TAKING CARE OF THIS PATIENT: 35 minutes.    Loletha Grayer M.D on 07/20/2017 at 4:00 PM  Between 7am to 6pm - Pager - [phone]  After 6pm go to [email or web address] - password [email or web address] [facility]  [phone]  CC: Primary care physician; Adline Potter, MD

## 2017-07-22 ENCOUNTER — Telehealth: Payer: Self-pay | Admitting: Cardiovascular Disease

## 2017-07-22 ENCOUNTER — Emergency Department: Payer: 59

## 2017-07-22 ENCOUNTER — Observation Stay
Admission: EM | Admit: 2017-07-22 | Discharge: 2017-07-22 | Disposition: A | Discharge status: Home / Self Care | Payer: 59 | Attending: Internal Medicine | Admitting: Internal Medicine

## 2017-07-22 ENCOUNTER — Encounter: Payer: Self-pay | Admitting: Emergency Medicine

## 2017-07-22 DIAGNOSIS — R202 Paresthesia of skin: Secondary | ICD-10-CM

## 2017-07-22 DIAGNOSIS — R0602 Shortness of breath: Secondary | ICD-10-CM | POA: Diagnosis not present

## 2017-07-22 DIAGNOSIS — Z8673 Personal history of transient ischemic attack (TIA), and cerebral infarction without residual deficits: Secondary | ICD-10-CM | POA: Insufficient documentation

## 2017-07-22 DIAGNOSIS — I509 Heart failure, unspecified: Secondary | ICD-10-CM | POA: Insufficient documentation

## 2017-07-22 DIAGNOSIS — Z7982 Long term (current) use of aspirin: Secondary | ICD-10-CM | POA: Insufficient documentation

## 2017-07-22 DIAGNOSIS — K219 Gastro-esophageal reflux disease without esophagitis: Secondary | ICD-10-CM | POA: Insufficient documentation

## 2017-07-22 DIAGNOSIS — E785 Hyperlipidemia, unspecified: Secondary | ICD-10-CM | POA: Diagnosis not present

## 2017-07-22 DIAGNOSIS — I480 Paroxysmal atrial fibrillation: Secondary | ICD-10-CM | POA: Insufficient documentation

## 2017-07-22 DIAGNOSIS — R200 Anesthesia of skin: Secondary | ICD-10-CM | POA: Diagnosis not present

## 2017-07-22 DIAGNOSIS — R748 Abnormal levels of other serum enzymes: Secondary | ICD-10-CM | POA: Diagnosis not present

## 2017-07-22 DIAGNOSIS — E876 Hypokalemia: Secondary | ICD-10-CM | POA: Insufficient documentation

## 2017-07-22 DIAGNOSIS — Z79899 Other long term (current) drug therapy: Secondary | ICD-10-CM | POA: Insufficient documentation

## 2017-07-22 DIAGNOSIS — Z87891 Personal history of nicotine dependence: Secondary | ICD-10-CM | POA: Insufficient documentation

## 2017-07-22 DIAGNOSIS — I252 Old myocardial infarction: Secondary | ICD-10-CM | POA: Insufficient documentation

## 2017-07-22 DIAGNOSIS — R0603 Acute respiratory distress: Secondary | ICD-10-CM | POA: Insufficient documentation

## 2017-07-22 DIAGNOSIS — I110 Hypertensive heart disease with heart failure: Secondary | ICD-10-CM | POA: Diagnosis not present

## 2017-07-22 DIAGNOSIS — I2510 Atherosclerotic heart disease of native coronary artery without angina pectoris: Secondary | ICD-10-CM | POA: Diagnosis not present

## 2017-07-22 DIAGNOSIS — N400 Enlarged prostate without lower urinary tract symptoms: Secondary | ICD-10-CM | POA: Diagnosis not present

## 2017-07-22 DIAGNOSIS — R079 Chest pain, unspecified: Secondary | ICD-10-CM | POA: Diagnosis not present

## 2017-07-22 LAB — BASIC METABOLIC PANEL
ANION GAP: 10 (ref 5–15)
BUN: 17 mg/dL (ref 6–20)
CHLORIDE: 102 mmol/L (ref 101–111)
CO2: 24 mmol/L (ref 22–32)
Calcium: 9.2 mg/dL (ref 8.9–10.3)
Creatinine, Ser: 1.15 mg/dL (ref 0.61–1.24)
GFR calc non Af Amer: >60 mL/min (ref >60)
Glucose, Bld: 118 mg/dL — ABNORMAL HIGH (ref 65–99)
POTASSIUM: 3.5 mmol/L (ref 3.5–5.1)
Sodium: 136 mmol/L (ref 135–145)

## 2017-07-22 LAB — CBC
HEMATOCRIT: 41.5 % (ref 40.0–52.0)
HEMOGLOBIN: 13.9 g/dL (ref 13.0–18.0)
MCH: 28.3 pg (ref 26.0–34.0)
MCHC: 33.6 g/dL (ref 32.0–36.0)
MCV: 84.2 fL (ref 80.0–100.0)
Platelets: 277 10*3/uL (ref 150–440)
RBC: 4.93 MIL/uL (ref 4.40–5.90)
RDW: 15.3 % — ABNORMAL HIGH (ref 11.5–14.5)
WBC: 8.9 10*3/uL (ref 3.8–10.6)

## 2017-07-22 LAB — TROPONIN I
Troponin I: 6.22 ng/mL (ref <0.03)
Troponin I: 5.01 ng/mL (ref <0.03)

## 2017-07-22 MED ORDER — LOSARTAN POTASSIUM 25 MG PO TABS
12.5000 mg | ORAL_TABLET | Freq: Every day | ORAL | Status: DC
  Administered 2017-07-22: 12.5 mg via ORAL
  Filled 2017-07-22: qty 0.5

## 2017-07-22 MED ORDER — METOPROLOL TARTRATE 25 MG PO TABS
25.0000 mg | ORAL_TABLET | Freq: Two times a day (BID) | ORAL | Status: DC
Start: 2017-07-22 — End: 2017-07-22

## 2017-07-22 MED ORDER — ASPIRIN 81 MG PO CHEW
CHEWABLE_TABLET | ORAL | Status: AC
  Filled 2017-07-22: qty 1

## 2017-07-22 MED ORDER — ATORVASTATIN CALCIUM 20 MG PO TABS: 80.0000 mg | ORAL_TABLET | Freq: Every day | ORAL | Status: DC

## 2017-07-22 MED ORDER — FUROSEMIDE 10 MG/ML IJ SOLN
40.0000 mg | Freq: Once | INTRAMUSCULAR | Status: AC
  Administered 2017-07-22: 40 mg via INTRAVENOUS
  Filled 2017-07-22: qty 4

## 2017-07-22 MED ORDER — AMIODARONE HCL 200 MG PO TABS
400.0000 mg | ORAL_TABLET | Freq: Every day | ORAL | Status: DC
Start: 2017-07-22 — End: 2017-07-22
  Administered 2017-07-22: 400 mg via ORAL
  Filled 2017-07-22: qty 2

## 2017-07-22 MED ORDER — NITROGLYCERIN 0.4 MG SL SUBL: 0.4000 mg | SUBLINGUAL_TABLET | SUBLINGUAL | Status: DC | PRN

## 2017-07-22 MED ORDER — MAGNESIUM OXIDE 400 (241.3 MG) MG PO TABS
400.0000 mg | ORAL_TABLET | Freq: Every day | ORAL | Status: DC
  Administered 2017-07-22: 400 mg via ORAL

## 2017-07-22 MED ORDER — TAMSULOSIN HCL 0.4 MG PO CAPS
0.4000 mg | ORAL_CAPSULE | Freq: Every day | ORAL | Status: DC
  Filled 2017-07-22: qty 1

## 2017-07-22 MED ORDER — FUROSEMIDE 20 MG PO TABS: 20.0000 mg | ORAL_TABLET | Freq: Every day | ORAL | Status: DC

## 2017-07-22 MED ORDER — POTASSIUM CHLORIDE CRYS ER 20 MEQ PO TBCR
40.0000 meq | EXTENDED_RELEASE_TABLET | Freq: Once | ORAL | Status: AC
  Administered 2017-07-22: 40 meq via ORAL
  Filled 2017-07-22: qty 2

## 2017-07-22 MED ORDER — POTASSIUM CHLORIDE CRYS ER 10 MEQ PO TBCR: 10.0000 meq | EXTENDED_RELEASE_TABLET | Freq: Every day | ORAL | 0 refills | Status: DC

## 2017-07-22 MED ORDER — POTASSIUM CHLORIDE CRYS ER 20 MEQ PO TBCR: 10.0000 meq | EXTENDED_RELEASE_TABLET | Freq: Every day | ORAL | Status: DC

## 2017-07-22 MED ORDER — ACETAMINOPHEN 325 MG PO TABS: 650.0000 mg | ORAL_TABLET | Freq: Four times a day (QID) | ORAL | Status: DC | PRN

## 2017-07-22 MED ORDER — ASPIRIN EC 81 MG PO TBEC
81.0000 mg | DELAYED_RELEASE_TABLET | Freq: Every day | ORAL | Status: DC
  Administered 2017-07-22: 81 mg via ORAL

## 2017-07-22 MED ORDER — FUROSEMIDE 20 MG PO TABS: 20.0000 mg | ORAL_TABLET | Freq: Every day | ORAL | 0 refills | Status: DC

## 2017-07-22 MED ORDER — TICAGRELOR 90 MG PO TABS
90.0000 mg | ORAL_TABLET | Freq: Two times a day (BID) | ORAL | Status: DC
  Administered 2017-07-22: 90 mg via ORAL
  Filled 2017-07-22 (×2): qty 1

## 2017-07-22 MED ORDER — ACETAMINOPHEN 650 MG RE SUPP
650.0000 mg | Freq: Four times a day (QID) | RECTAL | Status: DC | PRN
  Filled 2017-07-22: qty 1

## 2017-07-22 MED ORDER — ASPIRIN EC 81 MG PO TBEC
DELAYED_RELEASE_TABLET | ORAL | Status: AC
  Filled 2017-07-22: qty 1

## 2017-07-22 MED ORDER — MAGNESIUM OXIDE 400 (241.3 MG) MG PO TABS
ORAL_TABLET | ORAL | Status: AC
  Filled 2017-07-22: qty 1

## 2017-07-22 MED ORDER — ENOXAPARIN SODIUM 40 MG/0.4ML ~~LOC~~ SOLN
40.0000 mg | SUBCUTANEOUS | Status: DC
  Filled 2017-07-22: qty 0.4

## 2017-07-22 NOTE — ED Triage Notes (Signed)
Pt arrived to ED from home where pt woke with sudden onset of SOB. Pt had STEMI with 1 delivered shock due to v-fib on Thursday. 2 stents placed. Pt d/c from hospital on Saturday. Pt denies chest pain at this time. MD at bedside for further eval.

## 2017-07-22 NOTE — Telephone Encounter (Signed)
Patient seen in the ER today. TCM

## 2017-07-22 NOTE — ED Provider Notes (Signed)
[facility] Emergency Department Provider Note   First MD Initiated Contact with Patient 07/22/17 [phone]     (approximate)  I have reviewed the triage vital signs and the nursing notes.   HISTORY  Chief Complaint Shortness of Breath   HPI Todd Weaver is a 65 y.o. male with below list of chronic medical conditions including inferior myocardial infarction on 07/18/2017 following PCI stent placement x2 presents to the emergency department with acute onset of dyspnea this morning.  Patient denied any chest pain but does admit to dizziness diaphoresis.  Patient states dyspnea improved while in route to the emergency department with EMS.  Patient given aspirin via EMS in route.   Past Medical History:  Diagnosis Date  . Acute ST elevation myocardial infarction (STEMI) of inferior wall ([place]) 07/18/2017  . CAD (coronary artery disease)    a. nuc stress test 9/17: small defect of mild severity in the apex location, partially reversible and suspected to be due to artifact given intense GI uptake, EF 55-65%, low risk study; b. inferior STEMI 07/18/17  . GERD (gastroesophageal reflux disease)   . Hemorrhoid   . Hyperlipidemia   . Hypertension   . Stroke [facility]) 2011   "2 MINI STROKES"    Patient Active Problem List   Diagnosis Date Noted  . Acute myocardial infarction of inferior wall ([place]) 07/18/2017  . STEMI (ST elevation myocardial infarction) ([place]) 07/18/2017  . Hypertension 06/19/2017  . Hyperlipidemia 06/19/2017  . History of CVA (cerebrovascular accident) 06/19/2017  . Overweight (BMI 25.0-29.9) 06/19/2017  . BPH (benign prostatic hyperplasia) 06/19/2017  . ED (erectile dysfunction) 06/19/2017  . MCI (mild cognitive impairment) 06/19/2017  . FH: Alzheimer's disease 06/19/2017  . Lumbar spondylosis with myelopathy 11/08/2016  . Hand dermatitis 07/26/2014    Past Surgical History:  Procedure Laterality Date  . ANAL FISTULECTOMY N/A 12/21/2014   Procedure: FISTULECTOMY ANAL;  Surgeon: Christene Lye, MD;  Location: [facility] ORS;  Service: General;  Laterality: N/A;  . APPENDECTOMY  1975  . CORONARY/GRAFT ACUTE MI REVASCULARIZATION N/A 07/18/2017   Procedure: Coronary/Graft Acute MI Revascularization;  Surgeon: Wellington Hampshire, MD;  Location: [facility];  Service: Cardiovascular;  Laterality: N/A;  . HEMORRHOID SURGERY N/A 12/21/2014   Procedure: HEMORRHOIDECTOMY;  Surgeon: Christene Lye, MD;  Location: [facility] ORS;  Service: General;  Laterality: N/A;  . KNEE SURGERY Right age 56  . LEFT HEART CATH AND CORONARY ANGIOGRAPHY N/A 07/18/2017   Procedure: LEFT HEART CATH AND CORONARY ANGIOGRAPHY;  Surgeon: Wellington Hampshire, MD;  Location: [place] [facility];  Service: Cardiovascular;  Laterality: N/A;  . ROTATOR CUFF REPAIR Right 2013    Prior to Admission medications   Medication Sig Start Date End Date Taking? Authorizing Provider  amiodarone (PACERONE) 200 MG tablet Take 2 tablets daily for five days then one tablet daily afterwards 07/20/17   Loletha Grayer, MD  aspirin EC 81 MG tablet Take 81 mg by mouth once.  05/23/09   [provider]  atorvastatin (LIPITOR) 80 MG tablet Take 1 tablet (80 mg total) by mouth daily at 6 PM. 07/20/17   Loletha Grayer, MD  losartan (COZAAR) 25 MG tablet Take 0.5 tablets (12.5 mg total) by mouth daily. 07/20/17   Loletha Grayer, MD  magnesium oxide (MAG-OX) 400 (241.3 Mg) MG tablet Take 1 tablet (400 mg total) by mouth daily. 07/20/17   Loletha Grayer, MD  metoprolol tartrate (LOPRESSOR) 25 MG tablet Take 1 tablet (25 mg  total) by mouth 2 (two) times daily. 07/20/17   Loletha Grayer, MD  nitroGLYCERIN (NITROSTAT) 0.4 MG SL tablet Place 1 tablet (0.4 mg total) under the tongue every 5 (five) minutes as needed for chest pain. 07/20/17   Loletha Grayer, MD  tamsulosin (FLOMAX) 0.4 MG CAPS capsule Take 1 capsule (0.4 mg total) by mouth daily. 06/19/17   Plonk,  Gwyndolyn Saxon, MD  ticagrelor (BRILINTA) 90 MG TABS tablet Take 1 tablet (90 mg total) by mouth 2 (two) times daily. 07/20/17   Loletha Grayer, MD    Allergies Shellfish allergy and Codeine  Family History  Problem Relation Age of Onset  . Hypertension Mother   . Alzheimer's disease Mother   . Hypertension Father   . Alzheimer's disease Father     Social History Social History   Tobacco Use  . Smoking status: Former Smoker    Last attempt to quit: 07/23/1998    Years since quitting: 19.0  . Smokeless tobacco: Never Used  Substance Use Topics  . Alcohol use: Yes    Alcohol/week: 0.0 oz    Comment: OCC  . Drug use: No    Review of Systems Constitutional: No fever/chills Eyes: No visual changes. ENT: No sore throat. Cardiovascular: Denies chest pain. Respiratory: Positive for shortness of breath. Gastrointestinal: No abdominal pain.  No nausea, no vomiting.  No diarrhea.  No constipation. Genitourinary: Negative for dysuria. Musculoskeletal: Negative for neck pain.  Negative for back pain. Integumentary: Negative for rash. Neurological: Negative for headaches, focal weakness or numbness.   ____________________________________________   PHYSICAL EXAM:  VITAL SIGNS: ED Triage Vitals  Enc Vitals Group     BP 07/22/17 0347 (!) 146/100     Pulse Rate 07/22/17 0347 62     Resp 07/22/17 0347 17     Temp 07/22/17 0347 98.2 F (36.8 C)     Temp Source 07/22/17 0347 Oral     SpO2 07/22/17 0347 99 %     Weight 07/22/17 0344 84.8 kg (187 lb)     Height --      Head Circumference --      Peak Flow --      Pain Score --      Pain Loc --      Pain Edu? --      Excl. in [place]? --     Constitutional: Alert and oriented. Well appearing and in no acute distress. Eyes: Conjunctivae are normal.  Head: Atraumatic. Mouth/Throat: Mucous membranes are moist.  Oropharynx non-erythematous. Neck: No stridor.   Cardiovascular: Normal rate, regular rhythm. Good peripheral circulation.  Grossly normal heart sounds. Respiratory: Normal respiratory effort.  No retractions. Lungs CTAB. Gastrointestinal: Soft and nontender. No distention.  Musculoskeletal: No lower extremity tenderness nor edema. No gross deformities of extremities. Neurologic:  Normal speech and language. No gross focal neurologic deficits are appreciated.  Skin:  Skin is warm, dry and intact. No rash noted. Psychiatric: Mood and affect are normal. Speech and behavior are normal.  ____________________________________________   LABS (all labs ordered are listed, but only abnormal results are displayed)  Labs Reviewed  BASIC METABOLIC PANEL - Abnormal; Notable for the following components:      Result Value   Glucose, Bld 118 (*)    All other components within normal limits  CBC - Abnormal; Notable for the following components:   RDW 15.3 (*)    All other components within normal limits  TROPONIN I - Abnormal; Notable for the following components:   Troponin  I 6.22 (*)    All other components within normal limits   ____________________________________________  EKG  ED ECG REPORT I, [place] N BROWN, the attending physician, personally viewed and interpreted this ECG.   Date: 07/22/2017  EKG Time: 3:45 AM   Rate: 62  Rhythm: Normal sinus rhythm  Axis: Left axis deviation  Intervals: Normal  ST&T Change: Q waves noted in the inferior leads.  No ST segment elevation  RADIOLOGY I, [place] N BROWN, personally viewed and evaluated these images (plain radiographs) as part of my medical decision making, as well as reviewing the written report by the radiologist.  Dg Chest Port 1 View  Result Date: 07/22/2017 CLINICAL DATA:  Acute onset of respiratory distress. EXAM: PORTABLE CHEST 1 VIEW COMPARISON:  Chest radiograph performed 07/18/2017 FINDINGS: The lungs are well-aerated and clear. There is no evidence of focal opacification, pleural effusion or pneumothorax. The cardiomediastinal silhouette is  within normal limits. No acute osseous abnormalities are seen. External pacing pads are noted. IMPRESSION: No acute cardiopulmonary process seen. Electronically Signed   By: Garald Balding M.D.   On: 07/22/2017 04:45     Procedures   ____________________________________________   INITIAL IMPRESSION / ASSESSMENT AND PLAN / ED COURSE  As part of my medical decision making, I reviewed the following data within the electronic MEDICAL RECORD [id number] year old male with above-stated history of recent inferior myocardial infarction return to the emergency department with dyspnea.  Review of the patient's catheterization also revealed a 85% LAD lesion that the patient states will be treated by medical management.  Considered possibility of the patient's dyspnea being an anginal equivalent.  Laboratory data revealed troponin 6.22 which is down from troponin on 12/28 of 17.12.  Patient discussed with Dr. Marcille Blanco for hospital admission for further evaluation ____________________________________________  FINAL CLINICAL IMPRESSION(S) / ED DIAGNOSES  Final diagnoses:  Chest pain, unspecified type     MEDICATIONS GIVEN DURING THIS VISIT:  Medications - No data to display   ED Discharge Orders    None       Note:  This document was prepared using Dragon voice recognition software and may include unintentional dictation errors.    Gregor Hams, MD 07/22/17 [phone]

## 2017-07-22 NOTE — Consult Note (Signed)
Cardiology Consultation:   Patient ID: Todd Weaver; [id number]; 23-Oct-1950   Admit date: 07/22/2017 Date of Consult: 07/22/2017  Primary Care Provider: Adline Potter, MD Primary Cardiologist: Fletcher Anon   Patient Profile:   Todd Weaver is a 66 y.o. male with a hx of recent inferior ST elevation MI on 07/18/2017 s/p PCI/DES x 2 to the RCA with residual disease involving the distal left main/ostial LAD, proximal LAD. Mid LAD, and LCx as detailed below, ICM/chronic systolic CHF, PAF, "mild strokes" x 2 ~ 8-10 years prior, HLD, prior tobacco abuse, ED, back pain, BPH, and mild cognitive impairment who is being seen today for the evaluation of SOB at the request of Dr. Leslye Peer, MD.  History of Present Illness:   Todd Weaver was previously seen by Dr. Yvone Neu for hypertension and bradycardia. He was initially seen in 02/2016 for evaluation of fatigue, weakness, and SOB. Heart rate noted to be 49 at initial visit on atenolol. Beta blocker was reduced. He underwent nuclear stress testing in 03/2016 that showed a small defect of mild severity in the apex location, partially reversible and suspected to be due to artifact given intense GI uptake, EF 55-65%. Overall, this was a low risk study. Echo in 03/2016 showed an EF of 55-60%, normal wall motion, normal LV diastolic function, no significant valvular abnormalities. He was last seen on 09/25/2016 for follow up and was doing well at that time, improved off beta blocker.   He was recently admitted to [facility] on 07/18/2017 with an acute inferior ST elevation MI complicated by VF x 2 requiring brief episode of CPR and defibrillation x 2. He underwent successful PCI/DES x 2 to the RCA with residual 85% stenosed distal left main/ostial LAD, proximal LAD 60% stenosed, mid LAD 40% stenosed, proximal LCx 40% stenosed, post atrio 60% stenosed. Case was complicated by hypotension throughout the PCI likely due to RV involvement that responded to IV fluids and  norepinephrine gtt. Troponin peaked at 44.68 and was noted to be down trending during his admission. Post-procedure, in the ICU, he developed new onset Afib with RVR that was successfully treated with IV amiodarone, leading to conversion to sinus rhythm. He was not placed on triple therapy given this was a brief episode of Afib. Post-procedure echo showed an EF of 45-50%, moderate hypokinesis of the inferior and inferoseptal myocardium. He progressed during his admission with only a brief episode of chest pain on 12/28 while pushing off from the wheelchair to transfer to the bed that was felt to be MSK in etiology with troponin down trending and EKG not acute. On 12/29, he required a dose of IV Lasix prior to discharge with improvement in breathing. He was discharged on DAPT with ASA and Brilinta on 12/29. He did well on 12/30, watched football and visited with family. He did not miss any medications, including DAPT. On the morning of 12/31, around 3 AM, he was woken up with sudden onset of SOB with left hand paresthesias that lasted ~ 45-60 minutes before self resolving. He never had any chest or shoulder pain. Symptoms did not feel similar to his recent MI. He took 4 baby ASA at home and called EMS. Upon his arrival to [facility], vitals were stable, EKG was not acute as below, CXR was not acute, troponin of 6.22-->5.01 consistent with downward trend from recent admission as above. In the ED, he remained asymptomatic. Cardiology was asked to assess the patient given his SOB. Upon seeing the patient this  morning, he reports he is back to his baseline and is completely asymptomatic. Symptoms did not feel like his prior MI.   Past Medical History:  Diagnosis Date  . Acute ST elevation myocardial infarction (STEMI) of inferior wall ([place]) 07/18/2017  . CAD (coronary artery disease)    a. nuc stress test 9/17: small defect of mild severity in the apex location, partially reversible and suspected to be due to artifact  given intense GI uptake, EF 55-65%, low risk study; b. inferior STEMI 07/18/17  . GERD (gastroesophageal reflux disease)   . Hemorrhoid   . Hyperlipidemia   . Hypertension   . Stroke [facility]) 2011   "2 MINI STROKES"    Past Surgical History:  Procedure Laterality Date  . ANAL FISTULECTOMY N/A 12/21/2014   Procedure: FISTULECTOMY ANAL;  Surgeon: Christene Lye, MD;  Location: [facility] ORS;  Service: General;  Laterality: N/A;  . APPENDECTOMY  1975  . CORONARY/GRAFT ACUTE MI REVASCULARIZATION N/A 07/18/2017   Procedure: Coronary/Graft Acute MI Revascularization;  Surgeon: Wellington Hampshire, MD;  Location: [place] CV LAB;  Service: Cardiovascular;  Laterality: N/A;  . HEMORRHOID SURGERY N/A 12/21/2014   Procedure: HEMORRHOIDECTOMY;  Surgeon: Christene Lye, MD;  Location: [facility] ORS;  Service: General;  Laterality: N/A;  . KNEE SURGERY Right age 85  . LEFT HEART CATH AND CORONARY ANGIOGRAPHY N/A 07/18/2017   Procedure: LEFT HEART CATH AND CORONARY ANGIOGRAPHY;  Surgeon: Wellington Hampshire, MD;  Location: [place] CV LAB;  Service: Cardiovascular;  Laterality: N/A;  . ROTATOR CUFF REPAIR Right 2013     Home Meds: Prior to Admission medications   Medication Sig Start Date End Date Taking? Authorizing Provider  amiodarone (PACERONE) 200 MG tablet Take 2 tablets daily for five days then one tablet daily afterwards 07/20/17  Yes Wieting, Richard, MD  aspirin EC 81 MG tablet Take 81 mg by mouth once.  05/23/09  Yes [provider]  atorvastatin (LIPITOR) 80 MG tablet Take 1 tablet (80 mg total) by mouth daily at 6 PM. 07/20/17  Yes Wieting, Richard, MD  losartan (COZAAR) 25 MG tablet Take 0.5 tablets (12.5 mg total) by mouth daily. 07/20/17  Yes Wieting, Richard, MD  magnesium oxide (MAG-OX) 400 (241.3 Mg) MG tablet Take 1 tablet (400 mg total) by mouth daily. 07/20/17  Yes Wieting, Richard, MD  metoprolol tartrate (LOPRESSOR) 25 MG tablet Take 1 tablet (25 mg total) by  mouth 2 (two) times daily. 07/20/17  Yes Wieting, Richard, MD  nitroGLYCERIN (NITROSTAT) 0.4 MG SL tablet Place 1 tablet (0.4 mg total) under the tongue every 5 (five) minutes as needed for chest pain. 07/20/17  Yes Wieting, Richard, MD  tamsulosin (FLOMAX) 0.4 MG CAPS capsule Take 1 capsule (0.4 mg total) by mouth daily. 06/19/17  Yes Plonk, Gwyndolyn Saxon, MD  ticagrelor (BRILINTA) 90 MG TABS tablet Take 1 tablet (90 mg total) by mouth 2 (two) times daily. 07/20/17  Yes Loletha Grayer, MD    Inpatient Medications: Scheduled Meds: . furosemide  40 mg Intravenous Once  . potassium chloride  40 mEq Oral Once   Continuous Infusions:  PRN Meds:   Allergies:   Allergies  Allergen Reactions  . Shellfish Allergy Hives and Swelling  . Codeine Hives    Social History:   Social History   Socioeconomic History  . Marital status: Married    Spouse name: Not on file  . Number of children: Not on file  . Years of education: Not on file  .  Highest education level: Not on file  Social Needs  . Financial resource strain: Not on file  . Food insecurity - worry: Not on file  . Food insecurity - inability: Not on file  . Transportation needs - medical: Not on file  . Transportation needs - non-medical: Not on file  Occupational History  . Not on file  Tobacco Use  . Smoking status: Former Smoker    Last attempt to quit: 07/23/1998    Years since quitting: 19.0  . Smokeless tobacco: Never Used  Substance and Sexual Activity  . Alcohol use: Yes    Alcohol/week: 0.0 oz    Comment: OCC  . Drug use: No  . Sexual activity: Not on file  Other Topics Concern  . Not on file  Social History Narrative  . Not on file     Family History:  Family History  Problem Relation Age of Onset  . Hypertension Mother   . Alzheimer's disease Mother   . Hypertension Father   . Alzheimer's disease Father     ROS:  Review of Systems  Constitutional: Negative for chills, diaphoresis, fever,  malaise/fatigue and weight loss.  HENT: Negative for congestion.   Eyes: Negative for discharge and redness.  Respiratory: Positive for shortness of breath. Negative for cough, hemoptysis, sputum production and wheezing.   Cardiovascular: Negative for chest pain, palpitations, orthopnea, claudication, leg swelling and PND.  Gastrointestinal: Negative for abdominal pain, blood in stool, heartburn, melena, nausea and vomiting.  Genitourinary: Negative for hematuria.  Musculoskeletal: Negative for falls and myalgias.  Skin: Negative for rash.  Neurological: Positive for sensory change. Negative for dizziness, tingling, tremors, speech change, focal weakness, loss of consciousness and weakness.  Endo/Heme/Allergies: Does not bruise/bleed easily.  Psychiatric/Behavioral: Negative for substance abuse. The patient is not nervous/anxious.       Physical Exam/Data:   Vitals:   07/22/17 0630 07/22/17 0700 07/22/17 0800 07/22/17 0906  BP: 140/87 (!) 145/99 137/90 138/84  Pulse:    (!) 55  Resp: '12 11 12 17  '$ Temp:      TempSrc:      SpO2:    98%  Weight:       No intake or output data in the 24 hours ending 07/22/17 0930 Filed Weights   07/22/17 0344  Weight: 187 lb (84.8 kg)   Body mass index is 29.29 kg/m.   Physical Exam: General: Well developed, well nourished, in no acute distress. Head: Normocephalic, atraumatic, sclera non-icteric, no xanthomas, nares without discharge.  Neck: Negative for carotid bruits. JVD not elevated. Lungs: Clear bilaterally to auscultation without wheezes, rales, or rhonchi. Breathing is unlabored. Heart: RRR with S1 S2. No murmurs, rubs, or gallops appreciated. Abdomen: Soft, non-tender, non-distended with normoactive bowel sounds. No hepatomegaly. No rebound/guarding. No obvious abdominal masses. Msk:  Strength and tone appear normal for age. Extremities: No clubbing or cyanosis. No edema. Distal pedal pulses are 2+ and equal bilaterally. Neuro: Alert  and oriented X 3. No facial asymmetry. No focal deficit. Moves all extremities spontaneously. Psych:  Responds to questions appropriately with a normal affect.   EKG:  The EKG was personally reviewed and demonstrates: NSR, 62 bpm, left axis deviation, inferior Q waves Telemetry:  Telemetry was personally reviewed and demonstrates: NSR  Weights: Filed Weights   07/22/17 0344  Weight: 187 lb (84.8 kg)    Relevant CV Studies: LHC 07/18/2017: Coronary Findings   Diagnostic  Dominance: Right  Left Main  The vessel exhibits minimal luminal irregularities.  Dist LM to Ost LAD lesion 85% stenosed  Dist LM to Ost LAD lesion is 85% stenosed.  Left Anterior Descending  Prox LAD lesion 60% stenosed  Prox LAD lesion is 60% stenosed.  Mid LAD lesion 40% stenosed  Mid LAD lesion is 40% stenosed.  First Diagonal Branch  There is mild disease in the vessel.  Second Diagonal Branch  There is mild disease in the vessel.  Third Diagonal Branch  There is mild disease in the vessel.  Left Circumflex  Prox Cx lesion 40% stenosed  Prox Cx lesion is 40% stenosed.  First Obtuse Marginal Branch  There is mild disease in the vessel.  Second Obtuse Marginal Branch  There is mild disease in the vessel.  Third Obtuse Marginal Branch  There is mild disease in the vessel.  Right Coronary Artery  Ost RCA to Mid RCA lesion 99% stenosed  Ost RCA to Mid RCA lesion is 99% stenosed. Vessel is the culprit lesion. The lesion is type C and thrombotic.  Right Posterior Descending Artery  There is moderate disease in the vessel.  Right Posterior Atrioventricular Branch  There is mild disease in the vessel.  Post Atrio lesion 60% stenosed  Post Atrio lesion is 60% stenosed.  Intervention   Ost RCA to Mid RCA lesion  Stent  Lesion length: 42 mm. Lesion crossed with guidewire using a WIRE RUNTHROUGH .[id number]. Pre-stent angioplasty was performed using a BALLOON TREK RX 2.5X15. Maximum pressure: 10 atm.  Inflation time: 60 sec. A drug-eluting stent was successfully placed using a STENT SIERRA 3.00 X 38 MM. Maximum pressure: 14 atm. Inflation time: 20 sec. Post-stent angioplasty was performed using a BALLOON [place] 6 EUPHORA [id number]. Maximum pressure: 14 atm. Inflation time: 15 sec. There was significant difficulty advancing the stent which required predilation with a 2.75 x 15 mm noncompliant balloon to fully dilate the lesions. The stent was not long enough to cover the whole lesion. I used a second 3.25 x 18 mm Xience Sierra drug-eluting stent which was overlapped with the other stent and extended close to the ostium. This was deployed to 12 atm. The balloon was used to post dilate the overlap area. I then used a 3.25 noncompliant balloon to post dilate both stents. Shortly after balloon angioplasty, the patient became very hypotensive which required starting dopamine which was then switched to Levophed due to tachycardia. The patient was also given 0.5 mg of atropine with IV fluids wide open. Hemodynamics gradually improved.  Stent  A stent was successfully placed.  Post-Intervention Lesion Assessment  The intervention was successful. Pre-interventional TIMI flow is 1. Post-intervention TIMI flow is 3. No complications occurred at this lesion.  There is no residual stenosis post intervention.  Coronary Diagrams   Diagnostic Diagram       Post-Intervention Diagram         Conclusion     Ost RCA to Mid RCA lesion is 99% stenosed.  A drug-eluting stent was successfully placed using a STENT SIERRA 3.00 X 38 MM.  Post intervention, there is a 0% residual stenosis.  A stent was successfully placed.  Post Atrio lesion is 60% stenosed.  Dist LM to Ost LAD lesion is 85% stenosed.  Prox Cx lesion is 40% stenosed.  Prox LAD lesion is 60% stenosed.  Mid LAD lesion is 40% stenosed.   1.  Significant two-vessel coronary artery disease with subtotal occlusion of the proximal right coronary  artery which is the culprit for inferior ST elevation myocardial infarction.  There is also 85% ostial LAD stenosis.  There is a medium sized ramus branch with 60% ostial stenosis. 2.  Normal left ventricular end-diastolic pressure. 3.  Successful complex angioplasty and 2 overlapped drug-eluting stent placement to the proximal and ostial right coronary artery.  Recommendations: The patient had hypotension throughout PCI likely due to RV involvement.  He responded to IV fluids and norepinephrine drip.  Continue dual antiplatelet therapy for at least one year.  Aggressive treatment of risk factors. Further ischemic evaluation of the LAD will be required in the near future.  Angiography was suboptimal given the patient's significant tachycardia. This was an overall difficult procedure due to long diffuse disease.    TTE 07/18/2017: Study Conclusions  - Left ventricle: The cavity size was normal. There was mild   concentric hypertrophy. Systolic function was mildly reduced. The   estimated ejection fraction was in the range of 45% to 50%.   Moderate hypokinesis of the inferior and inferoseptal myocardium.   The study was not technically sufficient to allow evaluation of   LV diastolic dysfunction due to atrial fibrillation. - Left atrium: The atrium was mildly dilated.   Laboratory Data:  Chemistry Recent Labs  Lab 07/19/17 0546 07/20/17 0533 07/22/17 0340  NA 135 136 136  K 3.2* 3.8 3.5  CL 106 105 102  CO2 21* 24 24  GLUCOSE 153* 115* 118*  BUN '10 11 17  '$ CREATININE 0.88 0.98 1.15  CALCIUM 8.7* 8.9 9.2  GFRNONAA >60 >60 >60  GFRAA >60 >60 >60  ANIONGAP '8 7 10    '$ Recent Labs  Lab 07/18/17 0645  PROT 7.5  ALBUMIN 4.1  AST 33  ALT 31  ALKPHOS 67  BILITOT 1.1   Hematology Recent Labs  Lab 07/18/17 0645 07/19/17 0546 07/22/17 0340  WBC 9.0 11.3* 8.9  RBC 4.99 4.72 4.93  HGB 14.1 13.1 13.9  HCT 41.9 39.7* 41.5  MCV 83.9 84.2 84.2  MCH 28.3 27.9 28.3  MCHC  33.7 33.1 33.6  RDW 15.1* 15.2* 15.3*  PLT 301 265 277   Cardiac Enzymes Recent Labs  Lab 07/18/17 1004 07/18/17 1531 07/18/17 1958 07/19/17 0735 07/19/17 1554 07/22/17 0340  TROPONINI 9.30* 43.86* 44.68* 25.94* 17.12* 6.22*   No results for input(s): TROPIPOC in the last 168 hours.  BNPNo results for input(s): BNP, PROBNP in the last 168 hours.  DDimer No results for input(s): DDIMER in the last 168 hours.  Radiology/Studies:  Dg Chest Port 1 View  Result Date: 07/22/2017 IMPRESSION: No acute cardiopulmonary process seen. Electronically Signed   By: Garald Balding M.D.   On: 07/22/2017 04:45    Assessment and Plan:   1. SOB/possible acute on chronic systolic CHF/ICM: -Uncertain etiology, though possible mild acute on chronic systolic CHF  -Resolved -Give one-time dose of IV Lasix 40 mg with KCl repletion -Will likely need PO Lasix at discharge -CHF education -Daily weights, strict Is and Os  2. Recent inferior ST elevation MI/CAD: -Never with chest pain -Troponin is appropriately down trending from recent peak of > 44 -Has not missed any DAPT -Continue DAPT with ASA and Brilinta -No plans for inpatient ischemic evaluation at this time -Cardiac rehab as an outpatient  3. PAF: -Brief episode in the ICU following PCI -None documented since -Continue to monitor -Not on full dose anticoagulation given brief episode and desire to avoid triple therapy  -If has a recurrence of arrhythmia, would likely need anticoagulation   4. HTN: -Continue PTA meds  5.  HLD: -Lipitor   For questions or updates, please contact [place] Please consult [email or web address] for contact info under Cardiology/STEMI.   Signed, [name], PA-C [place] Pager: [phone] 07/22/2017, 9:30 AM

## 2017-07-22 NOTE — Telephone Encounter (Signed)
TCM....     They saw Todd Weaver at [facility] for Cath  S/p PCI   They are scheduled to see [name]  on 08-15-16   They were seen for fu pci   They need to be seen within 7-10 days   Pt added wait list

## 2017-07-22 NOTE — ED Notes (Signed)
Pt awaiting admission assignment. Pt resting on stretcher at this time, denies any pain, family at bedside.

## 2017-07-22 NOTE — Consult Note (Signed)
[place] [facility] NAME: Todd Weaver    MR#:  [id number]  DATE OF BIRTH:  13-Jul-1951  DATE OF ADMISSION:  07/22/2017 and discharge 07/22/2017.  Since the patient never left the ER this will be billed as a ER consult and discharge.  PRIMARY CARE PHYSICIAN: Adline Potter, MD   REQUESTING/REFERRING PHYSICIAN: Dr Marjean Donna  CHIEF COMPLAINT:   Chief Complaint  Patient presents with  . Shortness of Breath    HISTORY OF PRESENT ILLNESS:  Todd Weaver  is a 66 y.o. male with a known history of recent STEMI came back to the ER with shortness of breath.  The patient states he also had some numbness and tingling on the left side of his body.  He stated he could not focus much with his left eye.  All the symptoms have resolved.  In the ER, he was seen by cardiology and they ordered a dose of Lasix.  The patient's shortness of breath has improved.  In the ER he had a couple troponins which have trended better.  First troponin VI 0.22 and second troponin 5.01.  This is down from 44.68 on 07/18/2017.  Cardiology did not want to do any further procedures and will follow-up as outpatient.  PAST MEDICAL HISTORY:   Past Medical History:  Diagnosis Date  . Acute ST elevation myocardial infarction (STEMI) of inferior wall ([place]) 07/18/2017  . CAD (coronary artery disease)    a. nuc stress test 9/17: small defect of mild severity in the apex location, partially reversible and suspected to be due to artifact given intense GI uptake, EF 55-65%, low risk study; b. inferior STEMI 07/18/17  . GERD (gastroesophageal reflux disease)   . Hemorrhoid   . Hyperlipidemia   . Hypertension   . Stroke [facility]) 2011   "2 MINI STROKES"    PAST SURGICAL HISTOIRY:   Past Surgical History:  Procedure Laterality Date  . ANAL FISTULECTOMY N/A 12/21/2014   Procedure: FISTULECTOMY ANAL;  Surgeon: Christene Lye, MD;  Location: [facility] ORS;  Service: General;   Laterality: N/A;  . APPENDECTOMY  1975  . CORONARY/GRAFT ACUTE MI REVASCULARIZATION N/A 07/18/2017   Procedure: Coronary/Graft Acute MI Revascularization;  Surgeon: Wellington Hampshire, MD;  Location: [place] [facility];  Service: Cardiovascular;  Laterality: N/A;  . HEMORRHOID SURGERY N/A 12/21/2014   Procedure: HEMORRHOIDECTOMY;  Surgeon: Christene Lye, MD;  Location: [facility] ORS;  Service: General;  Laterality: N/A;  . KNEE SURGERY Right age 89  . LEFT HEART CATH AND CORONARY ANGIOGRAPHY N/A 07/18/2017   Procedure: LEFT HEART CATH AND CORONARY ANGIOGRAPHY;  Surgeon: Wellington Hampshire, MD;  Location: [place] [facility];  Service: Cardiovascular;  Laterality: N/A;  . ROTATOR CUFF REPAIR Right 2013    SOCIAL HISTORY:   Social History   Tobacco Use  . Smoking status: Former Smoker    Last attempt to quit: 07/23/1998    Years since quitting: 19.0  . Smokeless tobacco: Never Used  Substance Use Topics  . Alcohol use: Yes    Alcohol/week: 0.0 oz    Comment: OCC    FAMILY HISTORY:   Family History  Problem Relation Age of Onset  . Hypertension Mother   . Alzheimer's disease Mother   . Hypertension Father   . Alzheimer's disease Father     DRUG ALLERGIES:   Allergies  Allergen Reactions  . Shellfish Allergy Hives and Swelling  . Codeine Hives    REVIEW  OF SYSTEMS:  CONSTITUTIONAL: No fever, fatigue or weakness.  EYES: Did not focus with his left eye briefly. EARS, NOSE, AND THROAT: No tinnitus or ear pain.  RESPIRATORY: No cough, positive for shortness of breath, no wheezing or hemoptysis.  CARDIOVASCULAR: No chest pain, orthopnea, edema.  GASTROINTESTINAL: No nausea, vomiting, diarrhea or abdominal pain.  GENITOURINARY: No dysuria, hematuria.  ENDOCRINE: No polyuria, nocturia,  HEMATOLOGY: No anemia, easy bruising or bleeding SKIN: No rash or lesion. MUSCULOSKELETAL: No joint pain or arthritis.   NEUROLOGIC: Numbness and tingling left side of his  body PSYCHIATRY: No anxiety or depression.   MEDICATIONS AT HOME:   Prior to Admission medications   Medication Sig Start Date End Date Taking? Authorizing Provider  amiodarone (PACERONE) 200 MG tablet Take 2 tablets daily for five days then one tablet daily afterwards 07/20/17  Yes Damaris Abeln, MD  aspirin EC 81 MG tablet Take 81 mg by mouth once.  05/23/09  Yes [provider]  atorvastatin (LIPITOR) 80 MG tablet Take 1 tablet (80 mg total) by mouth daily at 6 PM. 07/20/17  Yes Anjolaoluwa Siguenza, MD  losartan (COZAAR) 25 MG tablet Take 0.5 tablets (12.5 mg total) by mouth daily. 07/20/17  Yes Korin Hartwell, MD  magnesium oxide (MAG-OX) 400 (241.3 Mg) MG tablet Take 1 tablet (400 mg total) by mouth daily. 07/20/17  Yes Nieve Rojero, MD  metoprolol tartrate (LOPRESSOR) 25 MG tablet Take 1 tablet (25 mg total) by mouth 2 (two) times daily. 07/20/17  Yes Zackeriah Kissler, MD  nitroGLYCERIN (NITROSTAT) 0.4 MG SL tablet Place 1 tablet (0.4 mg total) under the tongue every 5 (five) minutes as needed for chest pain. 07/20/17  Yes Yoshito Gaza, MD  tamsulosin (FLOMAX) 0.4 MG CAPS capsule Take 1 capsule (0.4 mg total) by mouth daily. 06/19/17  Yes Plonk, Gwyndolyn Saxon, MD  ticagrelor (BRILINTA) 90 MG TABS tablet Take 1 tablet (90 mg total) by mouth 2 (two) times daily. 07/20/17  Yes Mickie Badders, MD  furosemide (LASIX) 20 MG tablet Take 1 tablet (20 mg total) by mouth daily. 07/23/17   Loletha Grayer, MD  potassium chloride SA (K-DUR,KLOR-CON) 10 MEQ tablet Take 1 tablet (10 mEq total) by mouth daily. 07/23/17   Loletha Grayer, MD   Lasix and potassium added to his medication list  VITAL SIGNS:  Blood pressure 125/77, pulse (!) 57, temperature 98.2 F (36.8 C), temperature source Oral, resp. rate 19, weight 84.8 kg (187 lb), SpO2 99 %.  PHYSICAL EXAMINATION:  GENERAL:  66 y.o.-year-old patient lying in the bed with no acute distress.  EYES: Pupils equal, round, reactive to  light and accommodation. No scleral icterus. Extraocular muscles intact.  HEENT: Head atraumatic, normocephalic. Oropharynx and nasopharynx clear.  NECK:  Supple, no jugular venous distention. No thyroid enlargement, no tenderness.  LUNGS: Normal breath sounds bilaterally, no wheezing, rales,rhonchi or crepitation. No use of accessory muscles of respiration.  CARDIOVASCULAR: S1, S2 normal. No murmurs, rubs, or gallops.  ABDOMEN: Soft, nontender, nondistended. Bowel sounds present. No organomegaly or mass.  EXTREMITIES: No pedal edema, cyanosis, or clubbing.  Good range of motion neck. NEUROLOGIC: Cranial nerves II through XII are intact. Muscle strength 5/5 in all extremities. Sensation intact. Gait not checked.  PSYCHIATRIC: The patient is alert and oriented x 3.  SKIN: Patient states he has a rash on his back and not clearly seeing that  LABORATORY PANEL:   CBC Recent Labs  Lab 07/22/17 0340  WBC 8.9  HGB 13.9  HCT 41.5  PLT 277   ------------------------------------------------------------------------------------------------------------------  Chemistries  Recent Labs  Lab 07/18/17 0645  07/20/17 0533 07/22/17 0340  NA 138   < > 136 136  K 3.1*   < > 3.8 3.5  CL 107   < > 105 102  CO2 18*   < > 24 24  GLUCOSE 149*   < > 115* 118*  BUN 15   < > 11 17  CREATININE 0.90   < > 0.98 1.15  CALCIUM 9.2   < > 8.9 9.2  MG  --    < > 1.9  --   AST 33  --   --   --   ALT 31  --   --   --   ALKPHOS 67  --   --   --   BILITOT 1.1  --   --   --    < > = values in this interval not displayed.   ------------------------------------------------------------------------------------------------------------------  Cardiac Enzymes Recent Labs  Lab 07/22/17 0859  TROPONINI 5.01*   ------------------------------------------------------------------------------------------------------------------  RADIOLOGY:  Dg Chest Port 1 View  Result Date: 07/22/2017 CLINICAL DATA:  Acute onset  of respiratory distress. EXAM: PORTABLE CHEST 1 VIEW COMPARISON:  Chest radiograph performed 07/18/2017 FINDINGS: The lungs are well-aerated and clear. There is no evidence of focal opacification, pleural effusion or pneumothorax. The cardiomediastinal silhouette is within normal limits. No acute osseous abnormalities are seen. External pacing pads are noted. IMPRESSION: No acute cardiopulmonary process seen. Electronically Signed   By: Garald Balding M.D.   On: 07/22/2017 04:45    EKG:  Normal sinus rhythm 65 bpm, left axis deviation, Q waves inferiorly  IMPRESSION AND PLAN:   1.  Shortness of breath.  Cardiology gave 1 dose of IV Lasix and this has improved his shortness of breath.  Will add 20 mg of Lasix orally daily and 10 mEq of potassium daily to his medication list. 2.  Recent STEMI.  Since troponins trending down I do not think this is a further episode of myocardial infarction.  Continue aspirin, Brilinta, metoprolol and atorvastatin.  Follow-up with cardiology as outpatient. 3.  Hyperlipidemia unspecified on Lipitor.  LDL 129 goal less than 70 4.  Hypokalemia.  Replace while on Lasix 5.  Hypomagnesemia.  Replace upon discharge 6.  Transient atrial fibrillation which converted to normal sinus rhythm.  Continue oral amiodarone. 7.  Transient left-sided tingling.  This was short lasting.  Already on good cardiac medications. 8.  Impaired fasting glucose.  Patient not a diabetic.  Patient will be discharged from the emergency room.  Since the patient was never admitted to the hospital and still in the emergency room, I will bill this as an ER consult and discharge.  This will serve as a consult note and discharge note on the same thing.  All the records are reviewed and case discussed with cardiology Dr. Rockey Situ. Management plans discussed with the patient, family and they are in agreement.  CODE STATUS: Full code  TOTAL TIME TAKING CARE OF THIS PATIENT: 50 minutes.    Loletha Grayer M.D on 07/22/2017 at 1:28 PM  Between 7am to 6pm - Pager - [phone]  After 6pm go to [email or web address] - password [email or web address] [facility]  Office  [phone]  CC: Primary care Physician: Adline Potter, MD

## 2017-07-22 NOTE — ED Notes (Signed)
Pt placed on zoll and cardiac pads.

## 2017-07-24 ENCOUNTER — Encounter: Payer: Self-pay | Admitting: *Deleted

## 2017-07-25 ENCOUNTER — Telehealth: Payer: Self-pay | Admitting: Cardiovascular Disease

## 2017-07-25 NOTE — Telephone Encounter (Signed)
Spoke with patient and advised that letter for jury duty is ready for pick up. He was appreciative for the call and has no further questions at this time. Letter placed up front in office.

## 2017-07-25 NOTE — Telephone Encounter (Signed)
Patient contacted regarding discharge from [facility] on 07/20/17.   Patient understands to follow up with provider ? On 08/15/17 at 1100 am at [facility] with [name], PA-C.  Patient understands discharge instructions? Yes   Patient understands medications and regiment? Yes  Patient understands to bring all medications to this visit? Yes

## 2017-07-26 ENCOUNTER — Telehealth: Payer: Self-pay | Admitting: Cardiovascular Disease

## 2017-07-26 NOTE — Telephone Encounter (Signed)
Spoke with patient and reviewed with him that we would give him this letter at his upcoming appointment. Confirmed return appointment, date, and time. Reviewed that he should not return to work until after he comes in for the appointment and he verbalized understanding with no further questions at this time. He was appreciative for the call back.

## 2017-07-26 NOTE — Telephone Encounter (Signed)
I left a message for the patient to call- trying to find out what type of work he does.

## 2017-07-26 NOTE — Telephone Encounter (Signed)
To Dr. Hillery Hunter, PA to review.

## 2017-07-26 NOTE — Telephone Encounter (Signed)
Pt came by office and picked up letter for [place] duty but now needs a return to letter work  When it is completed he would like to pick this up  Please advise

## 2017-07-26 NOTE — Telephone Encounter (Signed)
Patient will need to be evaluated in the office prior to RTW letter. Typically, a stemi is out of work for at least 2 weeks, sometimes longer, depending on their job.

## 2017-07-26 NOTE — Telephone Encounter (Signed)
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

## 2017-07-31 ENCOUNTER — Telehealth: Payer: Self-pay | Admitting: *Deleted

## 2017-07-31 NOTE — Telephone Encounter (Signed)
-----   Message from Blain Pais sent at 07/31/2017 12:09 PM EST ----- Regarding: tcm/ph 1/24 11:00 Dr. Fletcher Anon

## 2017-07-31 NOTE — Telephone Encounter (Signed)
Patient contacted regarding discharge from [facility] on 07/20/17.   Patient understands to follow up with provider ? On 08/15/17 at 11 am at [facility].  Patient understands discharge instructions? Yes   Patient understands medications and regiment? Yes  Patient understands to bring all medications to this visit? Yes

## 2017-08-01 ENCOUNTER — Encounter: Payer: 59 | Attending: Cardiovascular Disease | Admitting: *Deleted

## 2017-08-01 ENCOUNTER — Encounter: Payer: Self-pay | Admitting: *Deleted

## 2017-08-01 VITALS — Ht 67.5 in | Wt 179.2 lb

## 2017-08-01 DIAGNOSIS — Z48812 Encounter for surgical aftercare following surgery on the circulatory system: Secondary | ICD-10-CM | POA: Insufficient documentation

## 2017-08-01 DIAGNOSIS — I213 ST elevation (STEMI) myocardial infarction of unspecified site: Secondary | ICD-10-CM | POA: Diagnosis not present

## 2017-08-01 DIAGNOSIS — Z955 Presence of coronary angioplasty implant and graft: Secondary | ICD-10-CM | POA: Insufficient documentation

## 2017-08-01 NOTE — Patient Instructions (Signed)
Patient Instructions  Patient Details  Name: Todd Weaver MRN: [id number] Date of Birth: [birth year] Referring Provider:  Wellington Hampshire, MD  Below are the personal goals you chose as well as exercise and nutrition goals. Our goal is to help you keep on track towards obtaining and maintaining your goals. We will be discussing your progress on these goals with you throughout the program.  Initial Exercise Prescription: Initial Exercise Prescription - 08/01/17 1400      Date of Initial Exercise RX and Referring Provider   Date  08/01/17    Referring Provider  Arida      Treadmill   MPH  2.8    Grade  1    Minutes  15    METs  3.5      Recumbant Bike   Level  3    RPM  60    Watts  40    Minutes  15    METs  3.5      NuStep   Level  3    SPM  80    Minutes  15    METs  3.5      REL-XR   Level  3    Speed  50    Minutes  15    METs  3.5      Prescription Details   Frequency (times per week)  3    Duration  Progress to 45 minutes of aerobic exercise without signs/symptoms of physical distress      Intensity   THRR 40-80% of Max Heartrate  97-137    Ratings of Perceived Exertion  11-13    Perceived Dyspnea  0-4      Resistance Training   Training Prescription  Yes    Weight  3 lb    Reps  10-15       Exercise Goals: Frequency: Be able to perform aerobic exercise three times per week working toward 3-5 days per week.  Intensity: Work with a perceived exertion of 11 (fairly light) - 15 (hard) as tolerated. Follow your new exercise prescription and watch for changes in prescription as you progress with the program. Changes will be reviewed with you when they are made.  Duration: You should be able to do 30 minutes of continuous aerobic exercise in addition to a 5 minute warm-up and a 5 minute cool-down routine.  Nutrition Goals: Your personal nutrition goals will be established when you do your nutrition analysis with the dietician.  The following are  nutrition guidelines to follow: Cholesterol < 200mg /day Sodium < 1500mg /day Fiber: Men over 50 yrs - 30 grams per day  Personal Goals: Personal Goals and Risk Factors at Admission - 08/01/17 1354      Core Components/Risk Factors/Patient Goals on Admission    Weight Management  Yes;Obesity;Weight Loss    Intervention  Weight Management: Develop a combined nutrition and exercise program designed to reach desired caloric intake, while maintaining appropriate intake of nutrient and fiber, sodium and fats, and appropriate energy expenditure required for the weight goal.;Weight Management: Provide education and appropriate resources to help participant work on and attain dietary goals.;Weight Management/Obesity: Establish reasonable short term and long term weight goals.;Obesity: Provide education and appropriate resources to help participant work on and attain dietary goals.    Admit Weight  179 lb 3.2 oz (81.3 kg)    Goal Weight: Short Term  174 lb (78.9 kg)    Goal Weight: Long Term  165 lb (74.8 kg)  Expected Outcomes  Short Term: Continue to assess and modify interventions until short term weight is achieved;Long Term: Adherence to nutrition and physical activity/exercise program aimed toward attainment of established weight goal;Weight Loss: Understanding of general recommendations for a balanced deficit meal plan, which promotes 1-2 lb weight loss per week and includes a negative energy balance of [id number] kcal/d;Understanding recommendations for meals to include 15-35% energy as protein, 25-35% energy from fat, 35-60% energy from carbohydrates, less than 200mg  of dietary cholesterol, 20-35 gm of total fiber daily;Understanding of distribution of calorie intake throughout the day with the consumption of 4-5 meals/snacks    Hypertension  Yes    Intervention  Provide education on lifestyle modifcations including regular physical activity/exercise, weight management, moderate sodium restriction and  increased consumption of fresh fruit, vegetables, and low fat dairy, alcohol moderation, and smoking cessation.;Monitor prescription use compliance.    Expected Outcomes  Short Term: Continued assessment and intervention until BP is < 140/64mm HG in hypertensive participants. < 130/13mm HG in hypertensive participants with diabetes, heart failure or chronic kidney disease.;Long Term: Maintenance of blood pressure at goal levels.    Lipids  Yes    Intervention  Provide education and support for participant on nutrition & aerobic/resistive exercise along with prescribed medications to achieve LDL 70mg , HDL >40mg .    Expected Outcomes  Short Term: Participant states understanding of desired cholesterol values and is compliant with medications prescribed. Participant is following exercise prescription and nutrition guidelines.;Long Term: Cholesterol controlled with medications as prescribed, with individualized exercise RX and with personalized nutrition plan. Value goals: LDL < 70mg , HDL > 40 mg.    Stress  Yes Abron is stressed about having a heart attack. He says he has never had any type of heart issues before, so this is all new.    Intervention  Offer individual and/or small group education and counseling on adjustment to heart disease, stress management and health-related lifestyle change. Teach and support self-help strategies.;Refer participants experiencing significant psychosocial distress to appropriate mental health specialists for further evaluation and treatment. When possible, include family members and significant others in education/counseling sessions.    Expected Outcomes  Short Term: Participant demonstrates changes in health-related behavior, relaxation and other stress management skills, ability to obtain effective social support, and compliance with psychotropic medications if prescribed.;Long Term: Emotional wellbeing is indicated by absence of clinically significant psychosocial  distress or social isolation.       Tobacco Use Initial Evaluation: Social History   Tobacco Use  Smoking Status Former Smoker  . Last attempt to quit: 07/24/2007  . Years since quitting: 10.0  Smokeless Tobacco Never Used    Exercise Goals and Review: Exercise Goals    Row Name 08/01/17 1443             Exercise Goals   Increase Physical Activity  Yes       Intervention  Provide advice, education, support and counseling about physical activity/exercise needs.;Develop an individualized exercise prescription for aerobic and resistive training based on initial evaluation findings, risk stratification, comorbidities and participant's personal goals.       Expected Outcomes  Achievement of increased cardiorespiratory fitness and enhanced flexibility, muscular endurance and strength shown through measurements of functional capacity and personal statement of participant.       Increase Strength and Stamina  Yes       Intervention  Provide advice, education, support and counseling about physical activity/exercise needs.;Develop an individualized exercise prescription for aerobic and resistive training based on  initial evaluation findings, risk stratification, comorbidities and participant's personal goals.       Expected Outcomes  Achievement of increased cardiorespiratory fitness and enhanced flexibility, muscular endurance and strength shown through measurements of functional capacity and personal statement of participant.       Able to understand and use rate of perceived exertion (RPE) scale  Yes       Intervention  Provide education and explanation on how to use RPE scale       Expected Outcomes  Short Term: Able to use RPE daily in rehab to express subjective intensity level;Long Term:  Able to use RPE to guide intensity level when exercising independently       Able to understand and use Dyspnea scale  Yes       Intervention  Provide education and explanation on how to use Dyspnea  scale       Expected Outcomes  Short Term: Able to use Dyspnea scale daily in rehab to express subjective sense of shortness of breath during exertion;Long Term: Able to use Dyspnea scale to guide intensity level when exercising independently       Knowledge and understanding of Target Heart Rate Range (THRR)  Yes       Intervention  Provide education and explanation of THRR including how the numbers were predicted and where they are located for reference       Expected Outcomes  Short Term: Able to state/look up THRR;Short Term: Able to use daily as guideline for intensity in rehab;Long Term: Able to use THRR to govern intensity when exercising independently       Able to check pulse independently  Yes       Intervention  Provide education and demonstration on how to check pulse in carotid and radial arteries.;Review the importance of being able to check your own pulse for safety during independent exercise       Expected Outcomes  Short Term: Able to explain why pulse checking is important during independent exercise;Long Term: Able to check pulse independently and accurately       Understanding of Exercise Prescription  Yes       Intervention  Provide education, explanation, and written materials on patient's individual exercise prescription       Expected Outcomes  Short Term: Able to explain program exercise prescription;Long Term: Able to explain home exercise prescription to exercise independently          Copy of goals given to participant.

## 2017-08-01 NOTE — Progress Notes (Signed)
Cardiac Individual Treatment Plan  Patient Details  Name: Todd Weaver MRN: [id number] Date of Birth: [birth year] Referring Provider:     Cardiac Rehab from 08/01/2017 in [facility] Cardiac and Pulmonary Rehab  Referring Provider  Arida      Initial Encounter Date:    Cardiac Rehab from 08/01/2017 in [facility] Cardiac and Pulmonary Rehab  Date  08/01/17  Referring Provider  Fletcher Anon      Visit Diagnosis: ST elevation myocardial infarction (STEMI), unspecified artery [facility])  Status post coronary artery stent placement  Patient's Home Medications on Admission:  Current Outpatient Medications:  .  amiodarone (PACERONE) 200 MG tablet, Take 2 tablets daily for five days then one tablet daily afterwards, Disp: 35 tablet, Rfl: 0 .  aspirin EC 81 MG tablet, Take 81 mg by mouth once. , Disp: , Rfl:  .  atorvastatin (LIPITOR) 80 MG tablet, Take 1 tablet (80 mg total) by mouth daily at 6 PM., Disp: 30 tablet, Rfl: 0 .  furosemide (LASIX) 20 MG tablet, Take 1 tablet (20 mg total) by mouth daily., Disp: 30 tablet, Rfl: 0 .  losartan (COZAAR) 25 MG tablet, Take 0.5 tablets (12.5 mg total) by mouth daily., Disp: 30 tablet, Rfl: 0 .  magnesium oxide (MAG-OX) 400 (241.3 Mg) MG tablet, Take 1 tablet (400 mg total) by mouth daily., Disp: 30 tablet, Rfl: 0 .  metoprolol tartrate (LOPRESSOR) 25 MG tablet, Take 1 tablet (25 mg total) by mouth 2 (two) times daily., Disp: 60 tablet, Rfl: 0 .  nitroGLYCERIN (NITROSTAT) 0.4 MG SL tablet, Place 1 tablet (0.4 mg total) under the tongue every 5 (five) minutes as needed for chest pain., Disp: 30 tablet, Rfl: 0 .  potassium chloride SA (K-DUR,KLOR-CON) 10 MEQ tablet, Take 1 tablet (10 mEq total) by mouth daily., Disp: 30 tablet, Rfl: 0 .  tamsulosin (FLOMAX) 0.4 MG CAPS capsule, Take 1 capsule (0.4 mg total) by mouth daily., Disp: 30 capsule, Rfl: 2 .  ticagrelor (BRILINTA) 90 MG TABS tablet, Take 1 tablet (90 mg total) by mouth 2 (two) times daily., Disp: 60 tablet, Rfl:  0  Past Medical History: Past Medical History:  Diagnosis Date  . Acute ST elevation myocardial infarction (STEMI) of inferior wall ([place]) 07/18/2017  . CAD (coronary artery disease)    a. nuc stress test 9/17: small defect of mild severity in the apex location, partially reversible and suspected to be due to artifact given intense GI uptake, EF 55-65%, low risk study; b. inferior STEMI 07/18/17  . GERD (gastroesophageal reflux disease)   . Hemorrhoid   . Hyperlipidemia   . Hypertension   . Stroke [facility]) 2011   "2 MINI STROKES"    Tobacco Use: Social History   Tobacco Use  Smoking Status Former Smoker  . Last attempt to quit: 07/24/2007  . Years since quitting: 10.0  Smokeless Tobacco Never Used    Labs: Recent Review Scientist, physiological    Labs for ITP Cardiac and Pulmonary Rehab Latest Ref Rng & Units 06/19/2017 07/17/2017 07/18/2017   Cholestrol 0 - 200 mg/dL 235(H) 223(H) 216(H)   LDLCALC 0 - 99 mg/dL 155(H) 130(H) 129(H)   HDL >40 mg/dL 61 73 66   Trlycerides <150 mg/dL 93 99 106   Hemoglobin A1c 4.8 - 5.6 % - - 5.8(H)       Exercise Target Goals: Date: 08/01/17  Exercise Program Goal: Individual exercise prescription set with THRR, safety & activity barriers. Participant demonstrates ability to understand and report RPE using BORG  scale, to self-measure pulse accurately, and to acknowledge the importance of the exercise prescription.  Exercise Prescription Goal: Starting with aerobic activity 30 plus minutes a day, 3 days per week for initial exercise prescription. Provide home exercise prescription and guidelines that participant acknowledges understanding prior to discharge.  Activity Barriers & Risk Stratification: Activity Barriers & Cardiac Risk Stratification - 08/01/17 1415      Activity Barriers & Cardiac Risk Stratification   Activity Barriers  Back Problems;Shortness of Breath back surgery May 2018    Cardiac Risk Stratification  Moderate       6 Minute  Walk: 6 Minute Walk    Row Name 08/01/17 1446         6 Minute Walk   Distance  1475 feet     Walk Time  6 minutes     # of Rest Breaks  0     MPH  2.8     METS  3.64     RPE  11     Perceived Dyspnea   0     VO2 Peak  12.73     Symptoms  No     Resting HR  59 bpm     Resting BP  118/66     Resting Oxygen Saturation   92 %     Exercise Oxygen Saturation  during 6 min walk  99 %     Max Ex. HR  114 bpm     Max Ex. BP  128/66     2 Minute Post BP  112/66        Oxygen Initial Assessment:   Oxygen Re-Evaluation:   Oxygen Discharge (Final Oxygen Re-Evaluation):   Initial Exercise Prescription: Initial Exercise Prescription - 08/01/17 1400      Date of Initial Exercise RX and Referring Provider   Date  08/01/17    Referring Provider  Arida      Treadmill   MPH  2.8    Grade  1    Minutes  15    METs  3.5      Recumbant Bike   Level  3    RPM  60    Watts  40    Minutes  15    METs  3.5      NuStep   Level  3    SPM  80    Minutes  15    METs  3.5      REL-XR   Level  3    Speed  50    Minutes  15    METs  3.5      Prescription Details   Frequency (times per week)  3    Duration  Progress to 45 minutes of aerobic exercise without signs/symptoms of physical distress      Intensity   THRR 40-80% of Max Heartrate  97-137    Ratings of Perceived Exertion  11-13    Perceived Dyspnea  0-4      Resistance Training   Training Prescription  Yes    Weight  3 lb    Reps  10-15       Perform Capillary Blood Glucose checks as needed.  Exercise Prescription Changes: Exercise Prescription Changes    Row Name 08/01/17 1400             Response to Exercise   Blood Pressure (Admit)  118/66       Blood Pressure (Exercise)  128/66  Heart Rate (Admit)  59 bpm       Heart Rate (Exercise)  114 bpm       Heart Rate (Exit)  57 bpm       Oxygen Saturation (Admit)  92 %       Oxygen Saturation (Exit)  99 %       Rating of Perceived Exertion  (Exercise)  11          Exercise Comments:   Exercise Goals and Review: Exercise Goals    Row Name 08/01/17 1443             Exercise Goals   Increase Physical Activity  Yes       Intervention  Provide advice, education, support and counseling about physical activity/exercise needs.;Develop an individualized exercise prescription for aerobic and resistive training based on initial evaluation findings, risk stratification, comorbidities and participant's personal goals.       Expected Outcomes  Achievement of increased cardiorespiratory fitness and enhanced flexibility, muscular endurance and strength shown through measurements of functional capacity and personal statement of participant.       Increase Strength and Stamina  Yes       Intervention  Provide advice, education, support and counseling about physical activity/exercise needs.;Develop an individualized exercise prescription for aerobic and resistive training based on initial evaluation findings, risk stratification, comorbidities and participant's personal goals.       Expected Outcomes  Achievement of increased cardiorespiratory fitness and enhanced flexibility, muscular endurance and strength shown through measurements of functional capacity and personal statement of participant.       Able to understand and use rate of perceived exertion (RPE) scale  Yes       Intervention  Provide education and explanation on how to use RPE scale       Expected Outcomes  Short Term: Able to use RPE daily in rehab to express subjective intensity level;Long Term:  Able to use RPE to guide intensity level when exercising independently       Able to understand and use Dyspnea scale  Yes       Intervention  Provide education and explanation on how to use Dyspnea scale       Expected Outcomes  Short Term: Able to use Dyspnea scale daily in rehab to express subjective sense of shortness of breath during exertion;Long Term: Able to use Dyspnea scale  to guide intensity level when exercising independently       Knowledge and understanding of Target Heart Rate Range (THRR)  Yes       Intervention  Provide education and explanation of THRR including how the numbers were predicted and where they are located for reference       Expected Outcomes  Short Term: Able to state/look up THRR;Short Term: Able to use daily as guideline for intensity in rehab;Long Term: Able to use THRR to govern intensity when exercising independently       Able to check pulse independently  Yes       Intervention  Provide education and demonstration on how to check pulse in carotid and radial arteries.;Review the importance of being able to check your own pulse for safety during independent exercise       Expected Outcomes  Short Term: Able to explain why pulse checking is important during independent exercise;Long Term: Able to check pulse independently and accurately       Understanding of Exercise Prescription  Yes       Intervention  Provide education, explanation, and written materials on patient's individual exercise prescription       Expected Outcomes  Short Term: Able to explain program exercise prescription;Long Term: Able to explain home exercise prescription to exercise independently          Exercise Goals Re-Evaluation :   Discharge Exercise Prescription (Final Exercise Prescription Changes): Exercise Prescription Changes - 08/01/17 1400      Response to Exercise   Blood Pressure (Admit)  118/66    Blood Pressure (Exercise)  128/66    Heart Rate (Admit)  59 bpm    Heart Rate (Exercise)  114 bpm    Heart Rate (Exit)  57 bpm    Oxygen Saturation (Admit)  92 %    Oxygen Saturation (Exit)  99 %    Rating of Perceived Exertion (Exercise)  11       Nutrition:  Target Goals: Understanding of nutrition guidelines, daily intake of sodium 1500mg , cholesterol 200mg , calories 30% from fat and 7% or less from saturated fats, daily to have 5 or more servings  of fruits and vegetables.  Biometrics: Pre Biometrics - 08/01/17 1442      Pre Biometrics   Height  5' 7.5" (1.715 m)    Weight  179 lb 3.2 oz (81.3 kg)    Waist Circumference  38.25 inches    Hip Circumference  39.5 inches    Waist to Hip Ratio  0.97 %    BMI (Calculated)  27.64    Single Leg Stand  30 seconds        Nutrition Therapy Plan and Nutrition Goals: Nutrition Therapy & Goals - 08/01/17 1401      Intervention Plan   Intervention  Prescribe, educate and counsel regarding individualized specific dietary modifications aiming towards targeted core components such as weight, hypertension, lipid management, diabetes, heart failure and other comorbidities.;Nutrition handout(s) given to patient.    Expected Outcomes  Short Term Goal: Understand basic principles of dietary content, such as calories, fat, sodium, cholesterol and nutrients.;Short Term Goal: A plan has been developed with personal nutrition goals set during dietitian appointment.;Long Term Goal: Adherence to prescribed nutrition plan.       Nutrition Discharge: Rate Your Plate Scores: Nutrition Assessments - 08/01/17 1402      MEDFICTS Scores   Pre Score  16       Nutrition Goals Re-Evaluation:   Nutrition Goals Discharge (Final Nutrition Goals Re-Evaluation):   Psychosocial: Target Goals: Acknowledge presence or absence of significant depression and/or stress, maximize coping skills, provide positive support system. Participant is able to verbalize types and ability to use techniques and skills needed for reducing stress and depression.   Initial Review & Psychosocial Screening: Initial Psych Review & Screening - 08/01/17 1405      Initial Review   Current issues with  Current Sleep Concerns;Current Stress Concerns    Comments  wakes up at 3 am every morning after his heart attack, stressed processing that he had a heart attack and what that means for his future. He wants to learn all he can about  cardiac health.       Family Dynamics   Good Support System?  Yes wife, friends      Screening Interventions   Interventions  Yes;Encouraged to exercise    Expected Outcomes  Short Term goal: Utilizing psychosocial counselor, staff and physician to assist with identification of specific Stressors or current issues interfering with healing process. Setting desired goal for each stressor or current  issue identified.;Long Term Goal: Stressors or current issues are controlled or eliminated.;Short Term goal: Identification and review with participant of any Quality of Life or Depression concerns found by scoring the questionnaire.;Long Term goal: The participant improves quality of Life and PHQ9 Scores as seen by post scores and/or verbalization of changes       Quality of Life Scores:  Quality of Life - 08/01/17 1412      Quality of Life Scores   Health/Function Pre  20.73 %    Socioeconomic Pre  20.88 %    Psych/Spiritual Pre  21 %    Family Pre  21 %    GLOBAL Pre  20.86 %       PHQ-9: Recent Review Flowsheet Data    Depression screen [facility] 2/9 08/01/2017 06/19/2017   Decreased Interest 0 0   Down, Depressed, Hopeless 0 0   PHQ - 2 Score 0 0   Altered sleeping 1 -   Tired, decreased energy 1 -   Change in appetite 0 -   Feeling bad or failure about yourself  0 -   Trouble concentrating 0 -   Moving slowly or fidgety/restless 0 -   Suicidal thoughts 0 -   PHQ-9 Score 2 -   Difficult doing work/chores Not difficult at all -     Interpretation of Total Score  Total Score Depression Severity:  1-4 = Minimal depression, 5-9 = Mild depression, 10-14 = Moderate depression, 15-19 = Moderately severe depression, 20-27 = Severe depression   Psychosocial Evaluation and Intervention:   Psychosocial Re-Evaluation:   Psychosocial Discharge (Final Psychosocial Re-Evaluation):   Vocational Rehabilitation: Provide vocational rehab assistance to qualifying candidates.   Vocational  Rehab Evaluation & Intervention: Vocational Rehab - 08/01/17 1413      Initial Vocational Rehab Evaluation & Intervention   Assessment shows need for Vocational Rehabilitation  No       Education: Education Goals: Education classes will be provided on a variety of topics geared toward better understanding of heart health and risk factor modification. Participant will state understanding/return demonstration of topics presented as noted by education test scores.  Learning Barriers/Preferences: Learning Barriers/Preferences - 08/01/17 1412      Learning Barriers/Preferences   Learning Barriers  None    Learning Preferences  Group Instruction       Education Topics: General Nutrition Guidelines/Fats and Fiber: -Group instruction provided by verbal, written material, models and posters to present the general guidelines for heart healthy nutrition. Gives an explanation and review of dietary fats and fiber.   Controlling Sodium/Reading Food Labels: -Group verbal and written material supporting the discussion of sodium use in heart healthy nutrition. Review and explanation with models, verbal and written materials for utilization of the food label.   Exercise Physiology & Risk Factors: - Group verbal and written instruction with models to review the exercise physiology of the cardiovascular system and associated critical values. Details cardiovascular disease risk factors and the goals associated with each risk factor.   Aerobic Exercise & Resistance Training: - Gives group verbal and written discussion on the health impact of inactivity. On the components of aerobic and resistive training programs and the benefits of this training and how to safely progress through these programs.   Flexibility, Balance, General Exercise Guidelines: - Provides group verbal and written instruction on the benefits of flexibility and balance training programs. Provides general exercise guidelines with  specific guidelines to those with heart or lung disease. Demonstration and skill practice provided.  Stress Management: - Provides group verbal and written instruction about the health risks of elevated stress, cause of high stress, and healthy ways to reduce stress.   Depression: - Provides group verbal and written instruction on the correlation between heart/lung disease and depressed mood, treatment options, and the stigmas associated with seeking treatment.   Anatomy & Physiology of the Heart: - Group verbal and written instruction and models provide basic cardiac anatomy and physiology, with the coronary electrical and arterial systems. Review of: AMI, Angina, Valve disease, Heart Failure, Cardiac Arrhythmia, Pacemakers, and the ICD.   Cardiac Procedures: - Group verbal and written instruction to review commonly prescribed medications for heart disease. Reviews the medication, class of the drug, and side effects. Includes the steps to properly store meds and maintain the prescription regimen. (beta blockers and nitrates)   Cardiac Medications I: - Group verbal and written instruction to review commonly prescribed medications for heart disease. Reviews the medication, class of the drug, and side effects. Includes the steps to properly store meds and maintain the prescription regimen.   Cardiac Medications II: -Group verbal and written instruction to review commonly prescribed medications for heart disease. Reviews the medication, class of the drug, and side effects. (all other drug classes)    Go Sex-Intimacy & Heart Disease, Get SMART - Goal Setting: - Group verbal and written instruction through game format to discuss heart disease and the return to sexual intimacy. Provides group verbal and written material to discuss and apply goal setting through the application of the S.M.A.R.T. Method.   Other Matters of the Heart: - Provides group verbal, written materials and models to  describe Heart Failure, Angina, Valve Disease, Peripheral Artery Disease, and Diabetes in the realm of heart disease. Includes description of the disease process and treatment options available to the cardiac patient.   Exercise & Equipment Safety: - Individual verbal instruction and demonstration of equipment use and safety with use of the equipment.   Cardiac Rehab from 08/01/2017 in [facility] Cardiac and Pulmonary Rehab  Date  08/01/17  Educator  [facility]  Instruction Review Code  1- Verbalizes Understanding      Infection Prevention: - Provides verbal and written material to individual with discussion of infection control including proper hand washing and proper equipment cleaning during exercise session.   Cardiac Rehab from 08/01/2017 in [facility] Cardiac and Pulmonary Rehab  Date  08/01/17  Educator  [facility]  Instruction Review Code  1- Verbalizes Understanding      Falls Prevention: - Provides verbal and written material to individual with discussion of falls prevention and safety.   Cardiac Rehab from 08/01/2017 in [facility] Cardiac and Pulmonary Rehab  Date  08/01/17  Educator  [facility]  Instruction Review Code  1- Verbalizes Understanding      Diabetes: - Individual verbal and written instruction to review signs/symptoms of diabetes, desired ranges of glucose level fasting, after meals and with exercise. Acknowledge that pre and post exercise glucose checks will be done for 3 sessions at entry of program.   Other: -Provides group and verbal instruction on various topics (see comments)    Knowledge Questionnaire Score: Knowledge Questionnaire Score - 08/01/17 1413      Knowledge Questionnaire Score   Pre Score  18/28 correct answers reviewed with Simona Huh       Core Components/Risk Factors/Patient Goals at Admission: Personal Goals and Risk Factors at Admission - 08/01/17 1354      Core Components/Risk Factors/Patient Goals on Admission    Weight Management  Yes;Obesity;Weight Loss     Intervention  Weight Management: Develop a combined nutrition and exercise program designed to reach desired caloric intake, while maintaining appropriate intake of nutrient and fiber, sodium and fats, and appropriate energy expenditure required for the weight goal.;Weight Management: Provide education and appropriate resources to help participant work on and attain dietary goals.;Weight Management/Obesity: Establish reasonable short term and long term weight goals.;Obesity: Provide education and appropriate resources to help participant work on and attain dietary goals.    Admit Weight  179 lb 3.2 oz (81.3 kg)    Goal Weight: Short Term  174 lb (78.9 kg)    Goal Weight: Long Term  165 lb (74.8 kg)    Expected Outcomes  Short Term: Continue to assess and modify interventions until short term weight is achieved;Long Term: Adherence to nutrition and physical activity/exercise program aimed toward attainment of established weight goal;Weight Loss: Understanding of general recommendations for a balanced deficit meal plan, which promotes 1-2 lb weight loss per week and includes a negative energy balance of [phone] kcal/d;Understanding recommendations for meals to include 15-35% energy as protein, 25-35% energy from fat, 35-60% energy from carbohydrates, less than 200mg  of dietary cholesterol, 20-35 gm of total fiber daily;Understanding of distribution of calorie intake throughout the day with the consumption of 4-5 meals/snacks    Hypertension  Yes    Intervention  Provide education on lifestyle modifcations including regular physical activity/exercise, weight management, moderate sodium restriction and increased consumption of fresh fruit, vegetables, and low fat dairy, alcohol moderation, and smoking cessation.;Monitor prescription use compliance.    Expected Outcomes  Short Term: Continued assessment and intervention until BP is < 140/67mm HG in hypertensive participants. < 130/58mm HG in hypertensive  participants with diabetes, heart failure or chronic kidney disease.;Long Term: Maintenance of blood pressure at goal levels.    Lipids  Yes    Intervention  Provide education and support for participant on nutrition & aerobic/resistive exercise along with prescribed medications to achieve LDL 70mg , HDL >40mg .    Expected Outcomes  Short Term: Participant states understanding of desired cholesterol values and is compliant with medications prescribed. Participant is following exercise prescription and nutrition guidelines.;Long Term: Cholesterol controlled with medications as prescribed, with individualized exercise RX and with personalized nutrition plan. Value goals: LDL < 70mg , HDL > 40 mg.    Stress  Yes Reily is stressed about having a heart attack. He says he has never had any type of heart issues before, so this is all new.    Intervention  Offer individual and/or small group education and counseling on adjustment to heart disease, stress management and health-related lifestyle change. Teach and support self-help strategies.;Refer participants experiencing significant psychosocial distress to appropriate mental health specialists for further evaluation and treatment. When possible, include family members and significant others in education/counseling sessions.    Expected Outcomes  Short Term: Participant demonstrates changes in health-related behavior, relaxation and other stress management skills, ability to obtain effective social support, and compliance with psychotropic medications if prescribed.;Long Term: Emotional wellbeing is indicated by absence of clinically significant psychosocial distress or social isolation.       Core Components/Risk Factors/Patient Goals Review:    Core Components/Risk Factors/Patient Goals at Discharge (Final Review):    ITP Comments: ITP Comments    Row Name 08/01/17 1344           ITP Comments  Med review completed. Initial ITP created. Diagnosis  can be found in [facility] 07/20/17  Comments: Initial ITP

## 2017-08-01 NOTE — Progress Notes (Signed)
Daily Session Note  Patient Details  Name: Todd Weaver MRN: [id number] Date of Birth: 03-May-1951 Referring Provider:     Cardiac Rehab from 08/01/2017 in [facility] Cardiac and Pulmonary Rehab  Referring Provider  Arida      Encounter Date: 08/01/2017  Check In: Session Check In - 08/01/17 1343      Check-In   Location  [facility]-Cardiac & Pulmonary Rehab    Staff Present  Renita Papa, RN BSN;Susanne Bice, RN, BSN, Lance Sell, BA, ACSM CEP, Exercise Physiologist    Supervising physician immediately available to respond to emergencies  See telemetry face sheet for immediately available ER MD    Medication changes reported      No    Tobacco Cessation  No Change quit in 2009    Warm-up and Cool-down  Performed as group-led instruction    Resistance Training Performed  Yes    VAD Patient?  No      Pain Assessment   Currently in Pain?  No/denies        Exercise Prescription Changes - 08/01/17 1400      Response to Exercise   Blood Pressure (Admit)  118/66    Blood Pressure (Exercise)  128/66    Heart Rate (Admit)  59 bpm    Heart Rate (Exercise)  114 bpm    Heart Rate (Exit)  57 bpm    Oxygen Saturation (Admit)  92 %    Oxygen Saturation (Exit)  99 %    Rating of Perceived Exertion (Exercise)  11       Social History   Tobacco Use  Smoking Status Former Smoker  . Last attempt to quit: 07/24/2007  . Years since quitting: 10.0  Smokeless Tobacco Never Used    Goals Met:  Proper associated with RPD/PD & O2 Sat Exercise tolerated well Personal goals reviewed No report of cardiac concerns or symptoms Strength training completed today  Goals Unmet:  Not Applicable  Comments: Med review completed   Dr. Emily Filbert is Medical Director for [place] and [facility].

## 2017-08-07 ENCOUNTER — Encounter: Payer: 59 | Admitting: *Deleted

## 2017-08-07 ENCOUNTER — Telehealth: Payer: Self-pay

## 2017-08-07 DIAGNOSIS — Z48812 Encounter for surgical aftercare following surgery on the circulatory system: Secondary | ICD-10-CM | POA: Diagnosis not present

## 2017-08-07 DIAGNOSIS — I213 ST elevation (STEMI) myocardial infarction of unspecified site: Secondary | ICD-10-CM

## 2017-08-07 DIAGNOSIS — Z955 Presence of coronary angioplasty implant and graft: Secondary | ICD-10-CM

## 2017-08-07 LAB — GLUCOSE, CAPILLARY: Glucose-Capillary: 87 mg/dL (ref 65–99)

## 2017-08-07 NOTE — Progress Notes (Signed)
Daily Session Note  Patient Details  Name: BEE HAMMERSCHMIDT MRN: [id number] Date of Birth: 1951/02/21 Referring Provider:     Cardiac Rehab from 08/01/2017 in [facility] Cardiac and Pulmonary Rehab  Referring Provider  Arida      Encounter Date: 08/07/2017  Check In: Session Check In - 08/07/17 1724      Check-In   Location  [facility]    Staff Present  Renita Papa, RN Vickki Hearing, BA, ACSM CEP, Exercise Physiologist;Carroll Enterkin, RN, BSN    Supervising physician immediately available to respond to emergencies  See telemetry face sheet for immediately available ER MD    Medication changes reported      No    Fall or balance concerns reported     No    Warm-up and Cool-down  Performed on first and last piece of equipment    Resistance Training Performed  Yes    VAD Patient?  No      Pain Assessment   Currently in Pain?  No/denies          Social History   Tobacco Use  Smoking Status Former Smoker  . Last attempt to quit: 07/24/2007  . Years since quitting: 10.0  Smokeless Tobacco Never Used    Goals Met:  Independence with exercise equipment Exercise tolerated well No report of cardiac concerns or symptoms Strength training completed today  Goals Unmet:  Not Applicable  Comments: First full day of exercise!  Patient was oriented to gym and equipment including functions, settings, policies, and procedures.  Patient's individual exercise prescription and treatment plan were reviewed.  All starting workloads were established based on the results of the 6 minute walk test done at initial orientation visit.  The plan for exercise progression was also introduced and progression will be customized based on patient's performance and goals.    Dr. Emily Filbert is Medical Director for [place] and [facility].

## 2017-08-07 NOTE — Telephone Encounter (Signed)
Patient wife came by to drop off forms for Short Term Disability Gave [employer] packet and explained release form and processing fee Patient wife stated they will mail Disability forms with packet to [facility]

## 2017-08-08 DIAGNOSIS — I213 ST elevation (STEMI) myocardial infarction of unspecified site: Secondary | ICD-10-CM

## 2017-08-08 DIAGNOSIS — Z48812 Encounter for surgical aftercare following surgery on the circulatory system: Secondary | ICD-10-CM | POA: Diagnosis not present

## 2017-08-08 NOTE — Progress Notes (Signed)
Daily Session Note  Patient Details  Name: Todd Weaver MRN: [id number] Date of Birth: 1951-03-19 Referring Provider:     Cardiac Rehab from 08/01/2017 in [facility] Cardiac and Pulmonary Rehab  Referring Provider  Arida      Encounter Date: 08/08/2017  Check In: Session Check In - 08/08/17 1632      Check-In   Location  [facility]    Staff Present  Earlean Shawl, BS, ACSM CEP, Exercise Physiologist;Meredith Sherryll Burger, RN BSN;Shalla Bulluck Flavia Shipper    Supervising physician immediately available to respond to emergencies  See telemetry face sheet for immediately available ER MD    Medication changes reported      No    Fall or balance concerns reported     No    Warm-up and Cool-down  Performed on first and last piece of equipment    Resistance Training Performed  Yes    VAD Patient?  No      Pain Assessment   Currently in Pain?  No/denies          Social History   Tobacco Use  Smoking Status Former Smoker  . Last attempt to quit: 07/24/2007  . Years since quitting: 10.0  Smokeless Tobacco Never Used    Goals Met:  Independence with exercise equipment Exercise tolerated well No report of cardiac concerns or symptoms Strength training completed today  Goals Unmet:  Not Applicable  Comments: Pt able to follow exercise prescription today without complaint.  Will continue to monitor for progression.   Dr. Emily Filbert is Medical Director for [place] and [facility].

## 2017-08-11 NOTE — Progress Notes (Signed)
Cardiology Office Note Date:  08/15/2017  Patient ID:  Aizen, Duval October 11, 1950, MRN [id number] PCP:  Adline Potter, MD  Cardiologist:  Dr. Fletcher Anon, MD    Chief Complaint: Hospital follow up  History of Present Illness: Todd Weaver is a 67 y.o. male with history of recent inferior ST elevation MI on 07/18/2017 s/p PCI/DES x 2 to the RCA with residual disease involving the distal left main/ostial LAD, proximal LAD, mid LAD, and LCx as detailed below, ICM/chronic systolic CHF, PAF, "mild strokes" x 2 ~ 8-10 years prior, HLD, prior tobacco abuse, ED, back pain, BPH, and mild cognitive impairment who presents for hospital follow up after recent admissions to [facility] from 12/27-12/29 for inferior STEMI followed by repeat admission to [facility] 12/31-12/31 for SOB.   Patient was previously seen by Dr. Yvone Neu for hypertension and bradycardia. He was initially seen in 02/2016 for evaluation of fatigue, weakness, and SOB. Heart rate noted to be 49 at initial visit on atenolol. Beta blocker was reduced. He underwent nuclear stress testing in 03/2016 that showed a small defect of mild severity in the apex location, partially reversible and suspected to be due to artifact given intense GI uptake, EF 55-65%. Overall, this was a low risk study. Echo in 03/2016 showed an EF of 55-60%, normal wall motion, normal LV diastolic function, no significant valvular abnormalities. He was seen on 09/25/2016 for follow up and was doing well at that time, improved off beta blocker. He was admitted to [facility] on 07/18/2017 with an acute inferior ST elevation MI complicated by VF x 2 requiring brief episode of CPR and defibrillation x 2. He underwent successful PCI/DES x 2 to the RCA with residual 85% stenosed distal left main/ostial LAD, proximal LAD 60% stenosed, mid LAD 40% stenosed, proximal LCx 40% stenosed, post atrio 60% stenosed. Case was complicated by hypotension throughout the PCI likely due to RV involvement that responded to  IV fluids and norepinephrine gtt. Troponin peaked at 44.68 Post-procedure, in the ICU, he developed new onset Afib with RVR that was successfully treated with IV amiodarone, leading to conversion to sinus rhythm. He was not placed on triple therapy given this was a brief episode of Afib. Post-procedure echo showed an EF of 45-50%, moderate hypokinesis of the inferior and inferoseptal myocardium. He was gently diuresed with improvement in breathing. He was discharged on DAPT with ASA and Brilinta along with amiodarone, Lipitor, losartan, magnesium oxide, Lopressor, SL NTG, along with his noncardiac medications. Discharge weight of 187 pounds. He returned to [facility] 12/31 with 30-45 minutes of SOB with left hand parasthesias. Symptoms did not feel like prior MI. Troponin was noted to still be down trending from prior, recent admission. EKG was not acute. He was asymptomatic in the ED. ED weight of 187 pounds. It was felt his SOB may have been mild pulmonary edema and it was recommended he take Lasix 20 mg daily with KCl 10 mEq daily. He has started to work with cardiac rehab.   Patient reports he has done well since his hospitalization.  He has enrolled in cardiac rehab.  He does note if he has a fairly exertional daily at home preceding cardiac rehabilitation.  He will develop exertional chest discomfort with associated shortness of breath.  He notes on 11/23 he was outdoors moving some chopped wood followed by his usual cardiac rehab session and noted exertional dyspnea with chest discomfort that improved with rest.  Patient was woken up this morning at 5:30 AM  with left-sided chest pain and left arm paresthesias.  Pain did not feel similar to his prior MI.  He took one sublingual nitroglycerin and 4 baby aspirin.  Pain lasted approximately 30 minutes and resolved.  He has been chest pain-free since.  He has not missed any doses of dual antiplatelet therapy.  He reports well-controlled blood pressure at home.  His  wife checks his blood pressure and he is uncertain what his heart rate has been running at home.  He denies any dizziness.  He is somewhat fatigued though this is improving.  He is anxious to get back to work and to begin to practice with his [employer].  Currently chest pain-free.   Past Medical History:  Diagnosis Date  . Acute ST elevation myocardial infarction (STEMI) of inferior wall ([place] [place]) 07/18/2017  . CAD (coronary artery disease)    a. nuc stress test 9/17: small defect of mild severity in the apex location, partially reversible and suspected to be due to artifact given intense GI uptake, EF 55-65%, low risk study; b. inferior STEMI 07/18/17  . GERD (gastroesophageal reflux disease)   . Hemorrhoid   . Hyperlipidemia   . Hypertension   [facility]   "2 MINI STROKES"    Past Surgical History:  Procedure Laterality Date  . ANAL FISTULECTOMY N/A 12/21/2014   Procedure: FISTULECTOMY ANAL;  Surgeon: Christene Lye, MD;  Location: [facility] ORS;  Service: General;  Laterality: N/A;  . APPENDECTOMY  1975  . CORONARY/GRAFT ACUTE MI REVASCULARIZATION N/A 07/18/2017   Procedure: Coronary/Graft Acute MI Revascularization;  Surgeon: Wellington Hampshire, MD;  Location: [place] CV LAB;  Service: Cardiovascular;  Laterality: N/A;  . HEMORRHOID SURGERY N/A 12/21/2014   Procedure: HEMORRHOIDECTOMY;  Surgeon: Christene Lye, MD;  Location: [facility] ORS;  Service: General;  Laterality: N/A;  . KNEE SURGERY Right age 30  . LEFT HEART CATH AND CORONARY ANGIOGRAPHY N/A 07/18/2017   Procedure: LEFT HEART CATH AND CORONARY ANGIOGRAPHY;  Surgeon: Wellington Hampshire, MD;  Location: [place] CV LAB;  Service: Cardiovascular;  Laterality: N/A;  . ROTATOR CUFF REPAIR Right 2013    Current Meds  Medication Sig  . aspirin EC 81 MG tablet Take 81 mg by mouth once.   Marland Kitchen atorvastatin (LIPITOR) 80 MG tablet Take 1 tablet (80 mg total) by mouth daily at 6 PM.  . furosemide (LASIX) 20 MG  tablet Take 1 tablet (20 mg total) by mouth daily.  Marland Kitchen losartan (COZAAR) 25 MG tablet Take 0.5 tablets (12.5 mg total) by mouth daily.  . magnesium oxide (MAG-OX) 400 (241.3 Mg) MG tablet Take 1 tablet (400 mg total) by mouth daily.  . metoprolol tartrate (LOPRESSOR) 25 MG tablet Take 0.5 tablets (12.5 mg total) by mouth 2 (two) times daily.  . nitroGLYCERIN (NITROSTAT) 0.4 MG SL tablet Place 1 tablet (0.4 mg total) under the tongue every 5 (five) minutes as needed for chest pain.  . potassium chloride SA (K-DUR,KLOR-CON) 10 MEQ tablet Take 1 tablet (10 mEq total) by mouth daily.  . tamsulosin (FLOMAX) 0.4 MG CAPS capsule Take 1 capsule (0.4 mg total) by mouth daily.  . ticagrelor (BRILINTA) 90 MG TABS tablet Take 1 tablet (90 mg total) by mouth 2 (two) times daily.  . [DISCONTINUED] amiodarone (PACERONE) 200 MG tablet Take 2 tablets daily for five days then one tablet daily afterwards  . [DISCONTINUED] metoprolol tartrate (LOPRESSOR) 25 MG tablet Take 1 tablet (25 mg total) by mouth 2 (  two) times daily.    Allergies:   Codeine and Shellfish allergy   Social History:  The patient  reports that he quit smoking about 10 years ago. he has never used smokeless tobacco. He reports that he drinks alcohol. He reports that he does not use drugs.   Family History:  The patient's family history includes Alzheimer's disease in his father and mother; Hypertension in his father and mother.  ROS:   Review of Systems  Constitutional: Positive for malaise/fatigue. Negative for chills, diaphoresis, fever and weight loss.  HENT: Negative for congestion.   Eyes: Negative for discharge and redness.  Respiratory: Positive for shortness of breath. Negative for cough, hemoptysis, sputum production and wheezing.   Cardiovascular: Positive for chest pain. Negative for palpitations, orthopnea, claudication, leg swelling and PND.  Gastrointestinal: Negative for abdominal pain, blood in stool, heartburn, melena, nausea  and vomiting.  Genitourinary: Negative for hematuria.  Musculoskeletal: Negative for falls and myalgias.  Skin: Negative for rash.  Neurological: Positive for tingling, sensory change and weakness. Negative for dizziness, tremors, speech change, focal weakness and loss of consciousness.  Endo/Heme/Allergies: Does not bruise/bleed easily.  Psychiatric/Behavioral: Negative for substance abuse. The patient is not nervous/anxious.   All other systems reviewed and are negative.    PHYSICAL EXAM:  VS:  BP (!) 110/58 (BP Location: Left Arm, Patient Position: Sitting, Cuff Size: Normal)   Pulse (!) 46   Ht 5\' 7"  (1.702 m)   Wt 181 lb 8 oz (82.3 kg)   BMI 28.43 kg/m  BMI: Body mass index is 28.43 kg/m.  Physical Exam  Constitutional: He is oriented to person, place, and time. He appears well-developed and well-nourished.  HENT:  Head: Normocephalic and atraumatic.  Eyes: Right eye exhibits no discharge. Left eye exhibits no discharge.  Neck: Normal range of motion. No JVD present.  Cardiovascular: Normal rate, regular rhythm, S1 normal, S2 normal and normal heart sounds. Exam reveals no distant heart sounds, no friction rub, no midsystolic click and no opening snap.  No murmur heard. Pulses:      Posterior tibial pulses are 2+ on the right side, and 2+ on the left side.  Right radial cardiac catheterization site well-healed without active bleeding, bruising, swelling, erythema, or warmth.  Radial pulse 2+.  Pulmonary/Chest: Effort normal and breath sounds normal. No respiratory distress. He has no decreased breath sounds. He has no wheezes. He has no rales. He exhibits no tenderness.  Abdominal: Soft. He exhibits no distension. There is no tenderness.  Musculoskeletal: He exhibits no edema.  Neurological: He is alert and oriented to person, place, and time.  Skin: Skin is warm and dry. No cyanosis. Nails show no clubbing.  Psychiatric: He has a normal mood and affect. His speech is normal  and behavior is normal. Judgment and thought content normal.     EKG:  Was ordered and interpreted by me today. Shows sinus bradycardia, 46 bpm, inferior TWI  Recent Labs: 07/18/2017: ALT 31 07/19/2017: TSH 1.232 07/20/2017: Magnesium 1.9 07/22/2017: BUN 17; Creatinine, Ser 1.15; Hemoglobin 13.9; Platelets 277; Potassium 3.5; Sodium 136  07/18/2017: Cholesterol 216; HDL 66; LDL Cholesterol 129; Total CHOL/HDL Ratio 3.3; Triglycerides 106; VLDL 21   CrCl cannot be calculated (Patient's most recent lab result is older than the maximum 21 days allowed.).   Wt Readings from Last 3 Encounters:  08/15/17 181 lb 8 oz (82.3 kg)  08/01/17 179 lb 3.2 oz (81.3 kg)  07/22/17 187 lb (84.8 kg)  Other studies reviewed: Additional studies/records reviewed today include: summarized above  ASSESSMENT AND PLAN:  1. CAD with recent inferior ST elevation MI with residual coronary artery disease as detailed above with angina: Given patient was woken up at 5:30 AM this morning with left-sided chest soreness with associated left arm paresthesias we will order a stat troponin to be ran in the medical mall.  Patient will wait these in the medical mall until he hears from [place].  If troponin is found to be elevated he will need to be evaluated in the ED for admission and repeat cardiac catheterization given his residual CAD as noted as above.  If this troponin is negative, we will plan for the patient to proceed with outpatient diagnostic cardiac catheterization on 08/19/2017 with Dr. Fletcher Anon with with possible IVUS/FFR evaluation of the distal left main/ostial LAD lesion.  Further management of this lesion is pending at this evaluation.  Continue dual antiplatelet therapy with aspirin 81 mg daily and Brilinta 90 mg daily for the next 12 months.  Cardiac rehabilitation.  Continue to recommend patient be out of work until his residual left main/ostial LAD disease is evaluated and managed as well as patient being  asymptomatic.  I am happy to fill out any disability paperwork he may need.  2. Chronic systolic CHF/ICM: He does not appear grossly volume overloaded at this time.  Metoprolol has been decreased to 12.5 mg twice daily as below given his market sinus bradycardia.  Continue losartan 12.5 mg daily along with Lasix 20 mg daily.  CHF education.  Consider repeat echocardiogram in several months time to evaluate for improvement in LV systolic function status post revascularization as above.  3. PAF: Currently in sinus rhythm.  Heart rate bradycardic at 46 bpm.  Discontinue amiodarone.  Decrease metoprolol to 12.5 mg twice daily given market bradycardia.  Not on long-term, full dose anticoagulation given his episode of A. fib was brief in the setting of acute illness in the ICU as well as the need for dual antiplatelet therapy.  Should he have recurrence of A. fib, would likely need to revisit anticoagulation.  Continue to monitor.  4. Sinus bradycardia: Heart rate noted to be 46 bpm in the office today.  Patient is uncertain what heart rate has been running at home.  Suspect this is due to the combination of amiodarone and metoprolol.  Amiodarone has been discontinued as above.  Metoprolol has been decreased to 12.5 mg twice daily as above.  Recommend continued monitoring.  5. HTN: Well-controlled.  Continue losartan and Lasix as well as metoprolol as above.  6. HLD: Lipitor 80 mg daily.  Plan to recheck fasting lipid and liver in 2-3 months.  If LDL is not at goal of less than 70 consider addition of Zetia.  7. ED: Advised patient we will need to further evaluate and successfully manage his residual coronary artery disease prior to further discussion of ED management.  Disposition: F/u with Dr. Fletcher Anon in 2 weeks following repeat LHC.   Current medicines are reviewed at length with the patient today.  The patient did not have any concerns regarding medicines.  Signed, [name], PA-C 08/15/2017 12:15 PM      [address] [place], [place] [address] [phone]

## 2017-08-12 DIAGNOSIS — Z955 Presence of coronary angioplasty implant and graft: Secondary | ICD-10-CM

## 2017-08-12 DIAGNOSIS — Z48812 Encounter for surgical aftercare following surgery on the circulatory system: Secondary | ICD-10-CM | POA: Diagnosis not present

## 2017-08-12 DIAGNOSIS — I213 ST elevation (STEMI) myocardial infarction of unspecified site: Secondary | ICD-10-CM

## 2017-08-12 NOTE — Progress Notes (Signed)
Daily Session Note  Patient Details  Name: Todd Weaver MRN: [id number] Date of Birth: 10-06-1950 Referring Provider:     Cardiac Rehab from 08/01/2017 in [facility] Cardiac and Pulmonary Rehab  Referring Provider  Arida      Encounter Date: 08/12/2017  Check In: Session Check In - 08/12/17 3833      Check-In   Location  [facility]-Cardiac [facility]monary Rehab    Staff Present  [place], BS, [place];Nada Maclachlan, BA, ACSM CEP, Exercise Physiologist;Carroll Enterkin, RN, BSN    Supervising physician immediately available to respond to emergencies  See telemetry face sheet for immediately available ER MD    Medication changes reported      No    Fall or balance concerns reported     No    Tobacco Cessation  No Change    Warm-up and Cool-down  Performed on first and last piece of equipment    Resistance Training Performed  Yes    VAD Patient?  No      Pain Assessment   Currently in Pain?  No/denies    Multiple Pain Sites  No          Social History   Tobacco Use  Smoking Status Former Smoker  . Last attempt to quit: 07/24/2007  . Years since quitting: 10.0  Smokeless Tobacco Never Used    Goals Met:  Independence with exercise equipment Exercise tolerated well No report of cardiac concerns or symptoms Strength training completed today  Goals Unmet:  Not Applicable  Comments: Pt able to follow exercise prescription today without complaint.  Will continue to monitor for progression.    Dr. Emily Filbert is Medical Director for [place] and [facility].

## 2017-08-14 DIAGNOSIS — Z48812 Encounter for surgical aftercare following surgery on the circulatory system: Secondary | ICD-10-CM | POA: Diagnosis not present

## 2017-08-14 DIAGNOSIS — Z955 Presence of coronary angioplasty implant and graft: Secondary | ICD-10-CM

## 2017-08-14 DIAGNOSIS — I213 ST elevation (STEMI) myocardial infarction of unspecified site: Secondary | ICD-10-CM

## 2017-08-14 NOTE — Progress Notes (Signed)
Daily Session Note  Patient Details  Name: Todd Weaver MRN: [id number] Date of Birth: Jun 18, 1951 Referring Provider:     Cardiac Rehab from 08/01/2017 in [facility] Cardiac and Pulmonary Rehab  Referring Provider  Arida      Encounter Date: 08/14/2017  Check In: Session Check In - 08/14/17 1645      Check-In   Location  [facility]    Staff Present  Joellyn Rued, BS, PEC;Meredith Nome, RN Vickki Hearing, BA, ACSM CEP, Exercise Physiologist    Supervising physician immediately available to respond to emergencies  See telemetry face sheet for immediately available ER MD    Medication changes reported      No    Fall or balance concerns reported     No    Warm-up and Cool-down  Performed on first and last piece of equipment    Resistance Training Performed  Yes    VAD Patient?  No      Pain Assessment   Currently in Pain?  No/denies    Multiple Pain Sites  No          Social History   Tobacco Use  Smoking Status Former Smoker  . Last attempt to quit: 07/24/2007  . Years since quitting: 10.0  Smokeless Tobacco Never Used    Goals Met:  Independence with exercise equipment Exercise tolerated well No report of cardiac concerns or symptoms Strength training completed today  Goals Unmet:  Not Applicable  Comments: Pt able to follow exercise prescription today without complaint.  Will continue to monitor for progression.    Dr. Emily Filbert is Medical Director for [place] and [facility].

## 2017-08-15 ENCOUNTER — Other Ambulatory Visit: Payer: Self-pay | Admitting: Physician Assistant

## 2017-08-15 ENCOUNTER — Ambulatory Visit (INDEPENDENT_AMBULATORY_CARE_PROVIDER_SITE_OTHER): Payer: 59 | Admitting: Physician Assistant

## 2017-08-15 ENCOUNTER — Telehealth: Payer: Self-pay | Admitting: Physician Assistant

## 2017-08-15 ENCOUNTER — Other Ambulatory Visit
Admission: RE | Admit: 2017-08-15 | Discharge: 2017-08-15 | Disposition: A | Discharge status: Home / Self Care | Payer: 59 | Source: Ambulatory Visit | Attending: Physician Assistant | Admitting: Physician Assistant

## 2017-08-15 ENCOUNTER — Encounter: Payer: Self-pay | Admitting: Physician Assistant

## 2017-08-15 VITALS — BP 110/58 | HR 46 | Ht 67.0 in | Wt 181.5 lb

## 2017-08-15 DIAGNOSIS — E782 Mixed hyperlipidemia: Secondary | ICD-10-CM

## 2017-08-15 DIAGNOSIS — I480 Paroxysmal atrial fibrillation: Secondary | ICD-10-CM | POA: Diagnosis not present

## 2017-08-15 DIAGNOSIS — I255 Ischemic cardiomyopathy: Secondary | ICD-10-CM | POA: Diagnosis not present

## 2017-08-15 DIAGNOSIS — I25118 Atherosclerotic heart disease of native coronary artery with other forms of angina pectoris: Secondary | ICD-10-CM | POA: Diagnosis not present

## 2017-08-15 DIAGNOSIS — R079 Chest pain, unspecified: Secondary | ICD-10-CM | POA: Insufficient documentation

## 2017-08-15 DIAGNOSIS — R001 Bradycardia, unspecified: Secondary | ICD-10-CM

## 2017-08-15 DIAGNOSIS — I5022 Chronic systolic (congestive) heart failure: Secondary | ICD-10-CM | POA: Diagnosis not present

## 2017-08-15 DIAGNOSIS — I10 Essential (primary) hypertension: Secondary | ICD-10-CM

## 2017-08-15 DIAGNOSIS — I2119 ST elevation (STEMI) myocardial infarction involving other coronary artery of inferior wall: Secondary | ICD-10-CM

## 2017-08-15 DIAGNOSIS — I25110 Atherosclerotic heart disease of native coronary artery with unstable angina pectoris: Secondary | ICD-10-CM

## 2017-08-15 LAB — CBC WITH DIFFERENTIAL/PLATELET
BASOS ABS: 0.1 10*3/uL (ref 0–0.1)
BASOS PCT: 1 %
EOS PCT: 6 %
Eosinophils Absolute: 0.3 10*3/uL (ref 0–0.7)
HCT: 42.8 % (ref 40.0–52.0)
Hemoglobin: 14.1 g/dL (ref 13.0–18.0)
Lymphocytes Relative: 45 %
Lymphs Abs: 2.4 10*3/uL (ref 1.0–3.6)
MCH: 27.9 pg (ref 26.0–34.0)
MCHC: 32.9 g/dL (ref 32.0–36.0)
MCV: 84.8 fL (ref 80.0–100.0)
MONO ABS: 0.6 10*3/uL (ref 0.2–1.0)
Monocytes Relative: 11 %
Neutro Abs: 2 10*3/uL (ref 1.4–6.5)
Neutrophils Relative %: 37 %
PLATELETS: 265 10*3/uL (ref 150–440)
RBC: 5.05 MIL/uL (ref 4.40–5.90)
RDW: 14.4 % (ref 11.5–14.5)
WBC: 5.4 10*3/uL (ref 3.8–10.6)

## 2017-08-15 LAB — BASIC METABOLIC PANEL
ANION GAP: 5 (ref 5–15)
BUN: 14 mg/dL (ref 6–20)
CALCIUM: 9.4 mg/dL (ref 8.9–10.3)
CO2: 28 mmol/L (ref 22–32)
CREATININE: 1.13 mg/dL (ref 0.61–1.24)
Chloride: 105 mmol/L (ref 101–111)
Glucose, Bld: 105 mg/dL — ABNORMAL HIGH (ref 65–99)
Potassium: 3.9 mmol/L (ref 3.5–5.1)
Sodium: 138 mmol/L (ref 135–145)

## 2017-08-15 LAB — TROPONIN I

## 2017-08-15 MED ORDER — METOPROLOL TARTRATE 25 MG PO TABS: 12.5000 mg | ORAL_TABLET | Freq: Two times a day (BID) | ORAL | Status: DC

## 2017-08-15 NOTE — Telephone Encounter (Signed)
I called and spoke with the patient to confirm that his heart cath is scheduled for Monday 08/19/17 with Dr. Fletcher Anon. He is aware to arrive a the [place] entrance- registration- at 8:30 am the day of his procedure.  He is also aware: - NPO after midnight the night prior - he may take his meds with enough water to get them down safely- hold lasix the am of - someone will need to drive him home and be with him for the 1st 24 hours.   The patient verbalizes understanding of all of the above.

## 2017-08-15 NOTE — H&P (View-Only) (Signed)
Orders for cath on 08/19/2017 placed.

## 2017-08-15 NOTE — Patient Instructions (Addendum)
Medication Instructions: - Your physician has recommended you make the following change in your medication:  1) STOP amiodarone 2) DECREASE metoprolol tartrate 25 mg- take 1/2 tablet (12.5 mg) by mouth twice daily  Labwork: - Your physician recommends that you have lab work today: BMP/CBC/ troponin (please stay in the [place] area until we call you with the results of your troponin level)   Procedures/Testing: - Your physician has requested that you have a cardiac catheterization. Cardiac catheterization is used to diagnose and/or treat various heart conditions. Doctors may recommend this procedure for a number of different reasons. The most common reason is to evaluate chest pain. Chest pain can be a symptom of coronary artery disease (CAD), and cardiac catheterization can show whether plaque is narrowing or blocking your heart's arteries. This procedure is also used to evaluate the valves, as well as measure the blood flow and oxygen levels in different parts of your heart.   [facility] Cardiac Cath Instructions   You are scheduled for a Cardiac Cath on:__Monday 1/28/19__  Please arrive at _8:30_am on the day of your procedure  Please expect a call from our [place] to pre-register you  Do not eat/drink anything after midnight  Someone will need to drive you home  It is recommended someone be with you for the first 24 hours after your procedure  Wear clothes that are easy to get on/off and wear slip on shoes if possible   Medications bring a current list of all medications with you  _x__ You may take all of your medications the morning of your procedure with enough water to swallow safely unless listed below:  _x__ Do not take these medications before your procedure: hold lasix (furosemide)  Day of your procedure: Arrive at the [facility] entrance.  Free valet service is available.  After entering the [facility] please check-in at the registration desk  (1st desk on your right) to receive your armband. After receiving your armband someone will escort you to the cardiac cath/special procedures waiting area.  The usual length of stay after your procedure is about 2 to 3 hours.  This can vary.  If you have any questions, please call our office at [phone], or you may call the cardiac cath lab at [facility] directly at [phone]   Follow-Up: - pending  Any Additional Special Instructions Will Be Listed Below (If Applicable).     If you need a refill on your cardiac medications before your next appointment, please call your pharmacy.

## 2017-08-15 NOTE — Progress Notes (Signed)
Orders for cath on 08/19/2017 placed.

## 2017-08-16 ENCOUNTER — Telehealth: Payer: Self-pay | Admitting: Cardiovascular Disease

## 2017-08-16 NOTE — Telephone Encounter (Signed)
Will make Dr. Fletcher Anon aware.

## 2017-08-16 NOTE — Telephone Encounter (Signed)
Pt is calling states his Dr. Jolene Schimke , this is pt brother in law, who is a cardiologist in [place], would like to speak with Dr. Fletcher Anon regarding his brothers procedure on Monday. Pt oks verbally for [place] to speak with Dr. Leilani Merl. Dr. Leilani Merl # [phone]

## 2017-08-19 ENCOUNTER — Encounter
Admission: RE | Disposition: A | Discharge status: Home / Self Care | Payer: Self-pay | Source: Ambulatory Visit | Attending: Cardiovascular Disease

## 2017-08-19 ENCOUNTER — Ambulatory Visit
Admission: RE | Admit: 2017-08-19 | Discharge: 2017-08-19 | Disposition: A | Discharge status: Home / Self Care | Payer: No Typology Code available for payment source | Source: Ambulatory Visit | Attending: Cardiovascular Disease | Admitting: Cardiovascular Disease

## 2017-08-19 ENCOUNTER — Encounter: Payer: Self-pay | Admitting: *Deleted

## 2017-08-19 DIAGNOSIS — N529 Male erectile dysfunction, unspecified: Secondary | ICD-10-CM | POA: Insufficient documentation

## 2017-08-19 DIAGNOSIS — I255 Ischemic cardiomyopathy: Secondary | ICD-10-CM | POA: Diagnosis not present

## 2017-08-19 DIAGNOSIS — I2510 Atherosclerotic heart disease of native coronary artery without angina pectoris: Secondary | ICD-10-CM | POA: Insufficient documentation

## 2017-08-19 DIAGNOSIS — G3184 Mild cognitive impairment, so stated: Secondary | ICD-10-CM | POA: Insufficient documentation

## 2017-08-19 DIAGNOSIS — I5022 Chronic systolic (congestive) heart failure: Secondary | ICD-10-CM | POA: Diagnosis not present

## 2017-08-19 DIAGNOSIS — Z8673 Personal history of transient ischemic attack (TIA), and cerebral infarction without residual deficits: Secondary | ICD-10-CM | POA: Diagnosis not present

## 2017-08-19 DIAGNOSIS — Z87891 Personal history of nicotine dependence: Secondary | ICD-10-CM | POA: Insufficient documentation

## 2017-08-19 DIAGNOSIS — E785 Hyperlipidemia, unspecified: Secondary | ICD-10-CM | POA: Diagnosis not present

## 2017-08-19 DIAGNOSIS — R079 Chest pain, unspecified: Secondary | ICD-10-CM | POA: Diagnosis present

## 2017-08-19 DIAGNOSIS — I25110 Atherosclerotic heart disease of native coronary artery with unstable angina pectoris: Secondary | ICD-10-CM

## 2017-08-19 DIAGNOSIS — I252 Old myocardial infarction: Secondary | ICD-10-CM | POA: Insufficient documentation

## 2017-08-19 DIAGNOSIS — R001 Bradycardia, unspecified: Secondary | ICD-10-CM | POA: Insufficient documentation

## 2017-08-19 DIAGNOSIS — I110 Hypertensive heart disease with heart failure: Secondary | ICD-10-CM | POA: Insufficient documentation

## 2017-08-19 DIAGNOSIS — I480 Paroxysmal atrial fibrillation: Secondary | ICD-10-CM | POA: Diagnosis not present

## 2017-08-19 DIAGNOSIS — Z8249 Family history of ischemic heart disease and other diseases of the circulatory system: Secondary | ICD-10-CM | POA: Insufficient documentation

## 2017-08-19 DIAGNOSIS — Z955 Presence of coronary angioplasty implant and graft: Secondary | ICD-10-CM | POA: Insufficient documentation

## 2017-08-19 DIAGNOSIS — Z79899 Other long term (current) drug therapy: Secondary | ICD-10-CM | POA: Insufficient documentation

## 2017-08-19 HISTORY — PX: LEFT HEART CATH AND CORONARY ANGIOGRAPHY: CATH118249

## 2017-08-19 SURGERY — LEFT HEART CATH AND CORONARY ANGIOGRAPHY
Anesthesia: Moderate Sedation

## 2017-08-19 SURGERY — LEFT HEART CATH AND CORONARY ANGIOGRAPHY
Anesthesia: Moderate Sedation | Laterality: Left

## 2017-08-19 MED ORDER — IOPAMIDOL (ISOVUE-300) INJECTION 61%
INTRAVENOUS | Status: DC | PRN
  Administered 2017-08-19: 90 mL via INTRA_ARTERIAL

## 2017-08-19 MED ORDER — SODIUM CHLORIDE 0.9% FLUSH: 3.0000 mL | INTRAVENOUS | Status: DC | PRN

## 2017-08-19 MED ORDER — MIDAZOLAM HCL 2 MG/2ML IJ SOLN
INTRAMUSCULAR | Status: AC
  Filled 2017-08-19: qty 2

## 2017-08-19 MED ORDER — HEPARIN SODIUM (PORCINE) 1000 UNIT/ML IJ SOLN
INTRAMUSCULAR | Status: DC | PRN
  Administered 2017-08-19: 4000 [IU] via INTRAVENOUS

## 2017-08-19 MED ORDER — VERAPAMIL HCL 2.5 MG/ML IV SOLN
INTRAVENOUS | Status: DC | PRN
  Administered 2017-08-19: 2.5 mg via INTRA_ARTERIAL

## 2017-08-19 MED ORDER — FENTANYL CITRATE (PF) 100 MCG/2ML IJ SOLN
INTRAMUSCULAR | Status: AC
  Filled 2017-08-19: qty 2

## 2017-08-19 MED ORDER — NITROGLYCERIN 5 MG/ML IV SOLN
INTRAVENOUS | Status: AC
  Filled 2017-08-19: qty 10

## 2017-08-19 MED ORDER — SODIUM CHLORIDE 0.9 % IV SOLN: INTRAVENOUS | Status: DC

## 2017-08-19 MED ORDER — ASPIRIN 81 MG PO CHEW: 81.0000 mg | CHEWABLE_TABLET | ORAL | Status: DC

## 2017-08-19 MED ORDER — HEPARIN (PORCINE) IN NACL 2-0.9 UNIT/ML-% IJ SOLN
INTRAMUSCULAR | Status: AC
  Filled 2017-08-19: qty 500

## 2017-08-19 MED ORDER — VERAPAMIL HCL 2.5 MG/ML IV SOLN
INTRAVENOUS | Status: AC
  Filled 2017-08-19: qty 2

## 2017-08-19 MED ORDER — SODIUM CHLORIDE 0.9 % IV SOLN
INTRAVENOUS | Status: DC
  Administered 2017-08-19: 09:00:00 via INTRAVENOUS
  Administered 2017-08-19: 250 mL via INTRAVENOUS

## 2017-08-19 MED ORDER — MIDAZOLAM HCL 2 MG/2ML IJ SOLN
INTRAMUSCULAR | Status: DC | PRN
  Administered 2017-08-19: 1 mg via INTRAVENOUS

## 2017-08-19 MED ORDER — HEPARIN SODIUM (PORCINE) 1000 UNIT/ML IJ SOLN
INTRAMUSCULAR | Status: AC
  Filled 2017-08-19: qty 1

## 2017-08-19 MED ORDER — NITROGLYCERIN 1 MG/10 ML FOR IR/CATH LAB
INTRA_ARTERIAL | Status: DC | PRN
  Administered 2017-08-19: 200 ug via INTRACORONARY

## 2017-08-19 MED ORDER — SODIUM CHLORIDE 0.9% FLUSH: 3.0000 mL | Freq: Two times a day (BID) | INTRAVENOUS | Status: DC

## 2017-08-19 MED ORDER — SODIUM CHLORIDE 0.9 % IV SOLN: 250.0000 mL | INTRAVENOUS | Status: DC | PRN

## 2017-08-19 MED ORDER — FENTANYL CITRATE (PF) 100 MCG/2ML IJ SOLN
INTRAMUSCULAR | Status: DC | PRN
  Administered 2017-08-19: 50 ug via INTRAVENOUS

## 2017-08-19 SURGICAL SUPPLY — 7 items
CATH INFINITI 5FR ANG PIGTAIL (CATHETERS) ×3 IMPLANT
CATH OPTITORQUE JACKY 4.0 5F (CATHETERS) ×3 IMPLANT
DEVICE RAD TR BAND REGULAR (VASCULAR PRODUCTS) ×3 IMPLANT
GLIDESHEATH SLEND SS 6F .021 (SHEATH) ×3 IMPLANT
KIT MANI 3VAL PERCEP (MISCELLANEOUS) ×3 IMPLANT
PACK CARDIAC CATH (CUSTOM PROCEDURE TRAY) ×3 IMPLANT
WIRE ROSEN-J .035X260CM (WIRE) ×3 IMPLANT

## 2017-08-19 NOTE — Interval H&P Note (Signed)
History and Physical Interval Note:  08/19/2017 10:32 AM  Todd Weaver  has presented today for surgery, with the diagnosis of LT Cath    Chest pain   Known CAD  The various methods of treatment have been discussed with the patient and family. After consideration of risks, benefits and other options for treatment, the patient has consented to  Procedure(s): LEFT HEART CATH AND CORONARY ANGIOGRAPHY (Left) as a surgical intervention .  The patient's history has been reviewed, patient examined, no change in status, stable for surgery.  I have reviewed the patient's chart and labs.  Questions were answered to the patient's satisfaction.     Kathlyn Sacramento

## 2017-08-19 NOTE — Progress Notes (Signed)
Pt took Brillinta this am, PT/INR available from December, and he has an allergy to shellfish. Dr. Fletcher Anon paged and updated on Pt status. No orders received at this time.

## 2017-08-21 ENCOUNTER — Telehealth: Payer: Self-pay | Admitting: Cardiovascular Disease

## 2017-08-21 ENCOUNTER — Telehealth: Payer: Self-pay | Admitting: Physician Assistant

## 2017-08-21 ENCOUNTER — Other Ambulatory Visit: Payer: Self-pay

## 2017-08-21 ENCOUNTER — Encounter: Payer: Self-pay | Admitting: *Deleted

## 2017-08-21 DIAGNOSIS — I213 ST elevation (STEMI) myocardial infarction of unspecified site: Secondary | ICD-10-CM

## 2017-08-21 DIAGNOSIS — Z955 Presence of coronary angioplasty implant and graft: Secondary | ICD-10-CM

## 2017-08-21 MED ORDER — TICAGRELOR 90 MG PO TABS: 90.0000 mg | ORAL_TABLET | Freq: Two times a day (BID) | ORAL | 0 refills | Status: DC

## 2017-08-21 MED ORDER — POTASSIUM CHLORIDE CRYS ER 10 MEQ PO TBCR: 10.0000 meq | EXTENDED_RELEASE_TABLET | Freq: Every day | ORAL | 0 refills | Status: DC

## 2017-08-21 MED ORDER — FUROSEMIDE 20 MG PO TABS: 20.0000 mg | ORAL_TABLET | Freq: Every day | ORAL | 0 refills | Status: DC

## 2017-08-21 MED ORDER — METOPROLOL TARTRATE 25 MG PO TABS: 12.5000 mg | ORAL_TABLET | Freq: Two times a day (BID) | ORAL | Status: DC

## 2017-08-21 NOTE — Telephone Encounter (Signed)
Pt calling stating he is running out on   Metoprolol  Furosemide  Brilinta Potassium   States when he tried calling in a refill but was not able to get anymore He uses [facility] in [place]  He is asking if he needs to stop taking them or can we send in the refill  Please advise

## 2017-08-21 NOTE — Telephone Encounter (Signed)
Pt spouse calling stating pt is having a reaction on the medication we gave him  He is not sure which medication it is He states he has a rash on tongue and its been very dry.  This started last night  Would like to see if we can call and see what may be causing this  Please call back

## 2017-08-21 NOTE — Telephone Encounter (Signed)
Requested Prescriptions   Signed Prescriptions Disp Refills  . furosemide (LASIX) 20 MG tablet 30 tablet 0    Sig: Take 1 tablet (20 mg total) by mouth daily.    Authorizing Provider: Kathlyn Sacramento A    Ordering User: Janan Ridge metoprolol tartrate (LOPRESSOR) 25 MG tablet      Sig: Take 0.5 tablets (12.5 mg total) by mouth 2 (two) times daily.    Authorizing Provider: Kathlyn [place] A    Ordering User: Janan Ridge potassium chloride (K-DUR,KLOR-CON) 10 MEQ tablet 30 tablet 0    Sig: Take 1 tablet (10 mEq total) by mouth daily.    Authorizing Provider: Kathlyn [place] A    Ordering User: Janan Ridge ticagrelor (BRILINTA) 90 MG TABS tablet 60 tablet 0    Sig: Take 1 tablet (90 mg total) by mouth 2 (two) times daily.    Authorizing Provider: Kathlyn [place] A    Ordering User: Janan Ridge

## 2017-08-21 NOTE — Progress Notes (Signed)
Cardiac Individual Treatment Plan  Patient Details  Name: Todd Weaver MRN: [id number] Date of Birth: 1951-03-11 Referring Provider:     Cardiac Rehab from 08/01/2017 in [facility] Cardiac and Pulmonary Rehab  Referring Provider  Arida      Initial Encounter Date:    Cardiac Rehab from 08/01/2017 in [facility] Cardiac and Pulmonary Rehab  Date  08/01/17  Referring Provider  Fletcher Anon      Visit Diagnosis: ST elevation myocardial infarction (STEMI), unspecified artery [facility])  Status post coronary artery stent placement  Patient's Home Medications on Admission:  Current Outpatient Medications:  .  aspirin EC 81 MG tablet, Take 81 mg by mouth once. , Disp: , Rfl:  .  atorvastatin (LIPITOR) 80 MG tablet, Take 1 tablet (80 mg total) by mouth daily at 6 PM., Disp: 30 tablet, Rfl: 0 .  furosemide (LASIX) 20 MG tablet, Take 1 tablet (20 mg total) by mouth daily., Disp: 30 tablet, Rfl: 0 .  losartan (COZAAR) 25 MG tablet, Take 0.5 tablets (12.5 mg total) by mouth daily., Disp: 30 tablet, Rfl: 0 .  magnesium oxide (MAG-OX) 400 (241.3 Mg) MG tablet, Take 1 tablet (400 mg total) by mouth daily., Disp: 30 tablet, Rfl: 0 .  metoprolol tartrate (LOPRESSOR) 25 MG tablet, Take 0.5 tablets (12.5 mg total) by mouth 2 (two) times daily., Disp: , Rfl:  .  nitroGLYCERIN (NITROSTAT) 0.4 MG SL tablet, Place 1 tablet (0.4 mg total) under the tongue every 5 (five) minutes as needed for chest pain., Disp: 30 tablet, Rfl: 0 .  potassium chloride SA (K-DUR,KLOR-CON) 10 MEQ tablet, Take 1 tablet (10 mEq total) by mouth daily., Disp: 30 tablet, Rfl: 0 .  tamsulosin (FLOMAX) 0.4 MG CAPS capsule, Take 1 capsule (0.4 mg total) by mouth daily., Disp: 30 capsule, Rfl: 2 .  ticagrelor (BRILINTA) 90 MG TABS tablet, Take 1 tablet (90 mg total) by mouth 2 (two) times daily., Disp: 60 tablet, Rfl: 0  Past Medical History: Past Medical History:  Diagnosis Date  . Acute ST elevation myocardial infarction (STEMI) of inferior wall ([place])  07/18/2017  . CAD (coronary artery disease)    a. nuc stress test 9/17: small defect of mild severity in the apex location, partially reversible and suspected to be due to artifact given intense GI uptake, EF 55-65%, low risk study; b. inferior STEMI 07/18/17  . GERD (gastroesophageal reflux disease)   . Hemorrhoid   . Hyperlipidemia   . Hypertension   . Stroke [facility]) 2011   "2 MINI STROKES"    Tobacco Use: Social History   Tobacco Use  Smoking Status Former Smoker  . Last attempt to quit: 07/24/2007  . Years since quitting: 10.0  Smokeless Tobacco Never Used    Labs: Recent Review Scientist, physiological    Labs for ITP Cardiac and Pulmonary Rehab Latest Ref Rng & Units 06/19/2017 07/17/2017 07/18/2017   Cholestrol 0 - 200 mg/dL 235(H) 223(H) 216(H)   LDLCALC 0 - 99 mg/dL 155(H) 130(H) 129(H)   HDL >40 mg/dL 61 73 66   Trlycerides <150 mg/dL 93 99 106   Hemoglobin A1c 4.8 - 5.6 % - - 5.8(H)       Exercise Target Goals:    Exercise Program Goal: Individual exercise prescription set using results from initial 6 min walk test and THRR while considering  patient's activity barriers and safety.   Exercise Prescription Goal: Initial exercise prescription builds to 30-45 minutes a day of aerobic activity, 2-3 days per week.  Home  exercise guidelines will be given to patient during program as part of exercise prescription that the participant will acknowledge.  Activity Barriers & Risk Stratification: Activity Barriers & Cardiac Risk Stratification - 08/01/17 1415      Activity Barriers & Cardiac Risk Stratification   Activity Barriers  Back Problems;Shortness of Breath back surgery May 2018    Cardiac Risk Stratification  Moderate       6 Minute Walk: 6 Minute Walk    Row Name 08/01/17 1446         6 Minute Walk   Distance  1475 feet     Walk Time  6 minutes     # of Rest Breaks  0     MPH  2.8     METS  3.64     RPE  11     Perceived Dyspnea   0     VO2 Peak  12.73      Symptoms  No     Resting HR  59 bpm     Resting BP  118/66     Resting Oxygen Saturation   92 %     Exercise Oxygen Saturation  during 6 min walk  99 %     Max Ex. HR  114 bpm     Max Ex. BP  128/66     2 Minute Post BP  112/66        Oxygen Initial Assessment:   Oxygen Re-Evaluation:   Oxygen Discharge (Final Oxygen Re-Evaluation):   Initial Exercise Prescription: Initial Exercise Prescription - 08/01/17 1400      Date of Initial Exercise RX and Referring Provider   Date  08/01/17    Referring Provider  Arida      Treadmill   MPH  2.8    Grade  1    Minutes  15    METs  3.5      Recumbant Bike   Level  3    RPM  60    Watts  40    Minutes  15    METs  3.5      NuStep   Level  3    SPM  80    Minutes  15    METs  3.5      REL-XR   Level  3    Speed  50    Minutes  15    METs  3.5      Prescription Details   Frequency (times per week)  3    Duration  Progress to 45 minutes of aerobic exercise without signs/symptoms of physical distress      Intensity   THRR 40-80% of Max Heartrate  97-137    Ratings of Perceived Exertion  11-13    Perceived Dyspnea  0-4      Resistance Training   Training Prescription  Yes    Weight  3 lb    Reps  10-15       Perform Capillary Blood Glucose checks as needed.  Exercise Prescription Changes: Exercise Prescription Changes    Row Name 08/01/17 1400             Response to Exercise   Blood Pressure (Admit)  118/66       Blood Pressure (Exercise)  128/66       Heart Rate (Admit)  59 bpm       Heart Rate (Exercise)  114 bpm       Heart Rate (  Exit)  57 bpm       Oxygen Saturation (Admit)  92 %       Oxygen Saturation (Exit)  99 %       Rating of Perceived Exertion (Exercise)  11          Exercise Comments: Exercise Comments    Row Name 08/07/17 1725           Exercise Comments  First full day of exercise!  Patient was oriented to gym and equipment including functions, settings, policies, and  procedures.  Patient's individual exercise prescription and treatment plan were reviewed.  All starting workloads were established based on the results of the 6 minute walk test done at initial orientation visit.  The plan for exercise progression was also introduced and progression will be customized based on patient's performance and goals          Exercise Goals and Review: Exercise Goals    Row Name 08/01/17 1443             Exercise Goals   Increase Physical Activity  Yes       Intervention  Provide advice, education, support and counseling about physical activity/exercise needs.;Develop an individualized exercise prescription for aerobic and resistive training based on initial evaluation findings, risk stratification, comorbidities and participant's personal goals.       Expected Outcomes  Achievement of increased cardiorespiratory fitness and enhanced flexibility, muscular endurance and strength shown through measurements of functional capacity and personal statement of participant.       Increase Strength and Stamina  Yes       Intervention  Provide advice, education, support and counseling about physical activity/exercise needs.;Develop an individualized exercise prescription for aerobic and resistive training based on initial evaluation findings, risk stratification, comorbidities and participant's personal goals.       Expected Outcomes  Achievement of increased cardiorespiratory fitness and enhanced flexibility, muscular endurance and strength shown through measurements of functional capacity and personal statement of participant.       Able to understand and use rate of perceived exertion (RPE) scale  Yes       Intervention  Provide education and explanation on how to use RPE scale       Expected Outcomes  Short Term: Able to use RPE daily in rehab to express subjective intensity level;Long Term:  Able to use RPE to guide intensity level when exercising independently       Able to  understand and use Dyspnea scale  Yes       Intervention  Provide education and explanation on how to use Dyspnea scale       Expected Outcomes  Short Term: Able to use Dyspnea scale daily in rehab to express subjective sense of shortness of breath during exertion;Long Term: Able to use Dyspnea scale to guide intensity level when exercising independently       Knowledge and understanding of Target Heart Rate Range (THRR)  Yes       Intervention  Provide education and explanation of THRR including how the numbers were predicted and where they are located for reference       Expected Outcomes  Short Term: Able to state/look up THRR;Short Term: Able to use daily as guideline for intensity in rehab;Long Term: Able to use THRR to govern intensity when exercising independently       Able to check pulse independently  Yes       Intervention  Provide education and demonstration on how  to check pulse in carotid and radial arteries.;Review the importance of being able to check your own pulse for safety during independent exercise       Expected Outcomes  Short Term: Able to explain why pulse checking is important during independent exercise;Long Term: Able to check pulse independently and accurately       Understanding of Exercise Prescription  Yes       Intervention  Provide education, explanation, and written materials on patient's individual exercise prescription       Expected Outcomes  Short Term: Able to explain program exercise prescription;Long Term: Able to explain home exercise prescription to exercise independently          Exercise Goals Re-Evaluation : Exercise Goals Re-Evaluation    Row Name 08/07/17 1725             Exercise Goal Re-Evaluation   Exercise Goals Review  Able to understand and use rate of perceived exertion (RPE) scale;Knowledge and understanding of Target Heart Rate Range (THRR);Understanding of Exercise Prescription       Comments  Reviewed RPE scale, THR and program  prescription with pt today.  Pt voiced understanding and was given a copy of goals to take home.        Expected Outcomes  Short: Use RPE daily to regulate intensity.  Long: Follow program prescription in THR.          Discharge Exercise Prescription (Final Exercise Prescription Changes): Exercise Prescription Changes - 08/01/17 1400      Response to Exercise   Blood Pressure (Admit)  118/66    Blood Pressure (Exercise)  128/66    Heart Rate (Admit)  59 bpm    Heart Rate (Exercise)  114 bpm    Heart Rate (Exit)  57 bpm    Oxygen Saturation (Admit)  92 %    Oxygen Saturation (Exit)  99 %    Rating of Perceived Exertion (Exercise)  11       Nutrition:  Target Goals: Understanding of nutrition guidelines, daily intake of sodium <1522m, cholesterol <2041m calories 30% from fat and 7% or less from saturated fats, daily to have 5 or more servings of fruits and vegetables.  Biometrics: Pre Biometrics - 08/01/17 1442      Pre Biometrics   Height  5' 7.5" (1.715 m)    Weight  179 lb 3.2 oz (81.3 kg)    Waist Circumference  38.25 inches    Hip Circumference  39.5 inches    Waist to Hip Ratio  0.97 %    BMI (Calculated)  27.64    Single Leg Stand  30 seconds        Nutrition Therapy Plan and Nutrition Goals: Nutrition Therapy & Goals - 08/01/17 1401      Intervention Plan   Intervention  Prescribe, educate and counsel regarding individualized specific dietary modifications aiming towards targeted core components such as weight, hypertension, lipid management, diabetes, heart failure and other comorbidities.;Nutrition handout(s) given to patient.    Expected Outcomes  Short Term Goal: Understand basic principles of dietary content, such as calories, fat, sodium, cholesterol and nutrients.;Short Term Goal: A plan has been developed with personal nutrition goals set during dietitian appointment.;Long Term Goal: Adherence to prescribed nutrition plan.       Nutrition  Assessments: Nutrition Assessments - 08/01/17 1402      MEDFICTS Scores   Pre Score  16       Nutrition Goals Re-Evaluation:   Nutrition Goals Discharge (Final  Nutrition Goals Re-Evaluation):   Psychosocial: Target Goals: Acknowledge presence or absence of significant depression and/or stress, maximize coping skills, provide positive support system. Participant is able to verbalize types and ability to use techniques and skills needed for reducing stress and depression.   Initial Review & Psychosocial Screening: Initial Psych Review & Screening - 08/01/17 1405      Initial Review   Current issues with  Current Sleep Concerns;Current Stress Concerns    Comments  wakes up at 3 am every morning after his heart attack, stressed processing that he had a heart attack and what that means for his future. He wants to learn all he can about cardiac health.       Family Dynamics   Good Support System?  Yes wife, friends      Screening Interventions   Interventions  Yes;Encouraged to exercise    Expected Outcomes  Short Term goal: Utilizing psychosocial counselor, staff and physician to assist with identification of specific Stressors or current issues interfering with healing process. Setting desired goal for each stressor or current issue identified.;Long Term Goal: Stressors or current issues are controlled or eliminated.;Short Term goal: Identification and review with participant of any Quality of Life or Depression concerns found by scoring the questionnaire.;Long Term goal: The participant improves quality of Life and PHQ9 Scores as seen by post scores and/or verbalization of changes       Quality of Life Scores:  Quality of Life - 08/01/17 1412      Quality of Life Scores   Health/Function Pre  20.73 %    Socioeconomic Pre  20.88 %    Psych/Spiritual Pre  21 %    Family Pre  21 %    GLOBAL Pre  20.86 %      Scores of 19 and below usually indicate a poorer quality of life in  these areas.  A difference of  2-3 points is a clinically meaningful difference.  A difference of 2-3 points in the total score of the Quality of Life Index has been associated with significant improvement in overall quality of life, self-image, physical symptoms, and general health in studies assessing change in quality of life.  PHQ-9: Recent Review Flowsheet Data    Depression screen [facility] 2/9 08/01/2017 06/19/2017   Decreased Interest 0 0   Down, Depressed, Hopeless 0 0   PHQ - 2 Score 0 0   Altered sleeping 1 -   Tired, decreased energy 1 -   Change in appetite 0 -   Feeling bad or failure about yourself  0 -   Trouble concentrating 0 -   Moving slowly or fidgety/restless 0 -   Suicidal thoughts 0 -   PHQ-9 Score 2 -   Difficult doing work/chores Not difficult at all -     Interpretation of Total Score  Total Score Depression Severity:  1-4 = Minimal depression, 5-9 = Mild depression, 10-14 = Moderate depression, 15-19 = Moderately severe depression, 20-27 = Severe depression   Psychosocial Evaluation and Intervention: Psychosocial Evaluation - 08/07/17 1653      Psychosocial Evaluation & Interventions   Interventions  Encouraged to exercise with the program and follow exercise prescription    Comments  Counselor met with Mr. Greenleaf Odin) today for initial psychosocial evaluation.  He is a 67 year old who had a heart attack and 2 stents inserted on 12/28 (several weeks ago).  He has a strong support system with a spouse of 54 years; (2) daughters,  a brother and sister who live close by and Dionel is actively involved in his local church.  He had back surgery this past May and reports it was successful and less painful.  Yadriel states he only sleeps 4-5 hours/night and that has been typical for the past year or so.  He has a good appetite.  Dagen denies a history of depression or anxiety or any current symptoms, and states he is typically in a positive mood most of the time.  He has  minimal stress in his life other than his health concerns.  Thaine has goals to get back to "100%" with his heart - increasing in stamina and strength.  Staff will follow him throughout the course of this program.     Expected Outcomes  Leonid will benefit from consistent exercise to achieve his stated goals.  The educational and psychoeducational components of this program will be helpful in learning more about his condition and ways to cope more positively with it.      Continue Psychosocial Services   Follow up required by staff       Psychosocial Re-Evaluation:   Psychosocial Discharge (Final Psychosocial Re-Evaluation):   Vocational Rehabilitation: Provide vocational rehab assistance to qualifying candidates.   Vocational Rehab Evaluation & Intervention: Vocational Rehab - 08/01/17 1413      Initial Vocational Rehab Evaluation & Intervention   Assessment shows need for Vocational Rehabilitation  No       Education: Education Goals: Education classes will be provided on a variety of topics geared toward better understanding of heart health and risk factor modification. Participant will state understanding/return demonstration of topics presented as noted by education test scores.  Learning Barriers/Preferences: Learning Barriers/Preferences - 08/01/17 1412      Learning Barriers/Preferences   Learning Barriers  None    Learning Preferences  Group Instruction       Education Topics:  AED/CPR: - Group verbal and written instruction with the use of models to demonstrate the basic use of the AED with the basic ABC's of resuscitation.   General Nutrition Guidelines/Fats and Fiber: -Group instruction provided by verbal, written material, models and posters to present the general guidelines for heart healthy nutrition. Gives an explanation and review of dietary fats and fiber.   Controlling Sodium/Reading Food Labels: -Group verbal and written material supporting the  discussion of sodium use in heart healthy nutrition. Review and explanation with models, verbal and written materials for utilization of the food label.   Exercise Physiology & General Exercise Guidelines: - Group verbal and written instruction with models to review the exercise physiology of the cardiovascular system and associated critical values. Provides general exercise guidelines with specific guidelines to those with heart or lung disease.    Aerobic Exercise & Resistance Training: - Gives group verbal and written instruction on the various components of exercise. Focuses on aerobic and resistive training programs and the benefits of this training and how to safely progress through these programs..   Flexibility, Balance, Mind/Body Relaxation: Provides group verbal/written instruction on the benefits of flexibility and balance training, including mind/body exercise modes such as yoga, pilates and tai chi.  Demonstration and skill practice provided.   Stress and Anxiety: - Provides group verbal and written instruction about the health risks of elevated stress and causes of high stress.  Discuss the correlation between heart/lung disease and anxiety and treatment options. Review healthy ways to manage with stress and anxiety.   Cardiac Rehab from 08/14/2017 in [facility] Cardiac  and Pulmonary Rehab  Date  08/14/17  Educator  [facility]  Instruction Review Code  1- Verbalizes Understanding      Depression: - Provides group verbal and written instruction on the correlation between heart/lung disease and depressed mood, treatment options, and the stigmas associated with seeking treatment.   Anatomy & Physiology of the Heart: - Group verbal and written instruction and models provide basic cardiac anatomy and physiology, with the coronary electrical and arterial systems. Review of Valvular disease and Heart Failure   Cardiac Rehab from 08/14/2017 in [facility] Cardiac and Pulmonary Rehab  Date  08/12/17   Educator  CE  Instruction Review Code  1- Verbalizes Understanding      Cardiac Procedures: - Group verbal and written instruction to review commonly prescribed medications for heart disease. Reviews the medication, class of the drug, and side effects. Includes the steps to properly store meds and maintain the prescription regimen. (beta blockers and nitrates)   Cardiac Medications I: - Group verbal and written instruction to review commonly prescribed medications for heart disease. Reviews the medication, class of the drug, and side effects. Includes the steps to properly store meds and maintain the prescription regimen.   Cardiac Rehab from 08/14/2017 in [facility] Cardiac and Pulmonary Rehab  Date  08/07/17 Marisue Humble 2]  Educator  CE  Instruction Review Code  1- Verbalizes Understanding      Cardiac Medications II: -Group verbal and written instruction to review commonly prescribed medications for heart disease. Reviews the medication, class of the drug, and side effects. (all other drug classes)    Go Sex-Intimacy & Heart Disease, Get SMART - Goal Setting: - Group verbal and written instruction through game format to discuss heart disease and the return to sexual intimacy. Provides group verbal and written material to discuss and apply goal setting through the application of the S.M.A.R.T. Method.   Other Matters of the Heart: - Provides group verbal, written materials and models to describe Stable Angina and Peripheral Artery. Includes description of the disease process and treatment options available to the cardiac patient.   Exercise & Equipment Safety: - Individual verbal instruction and demonstration of equipment use and safety with use of the equipment.   Cardiac Rehab from 08/14/2017 in [facility] Cardiac and Pulmonary Rehab  Date  08/01/17  Educator  [facility]  Instruction Review Code  1- Verbalizes Understanding      Infection Prevention: - Provides verbal and written material to  individual with discussion of infection control including proper hand washing and proper equipment cleaning during exercise session.   Cardiac Rehab from 08/14/2017 in [facility] Cardiac and Pulmonary Rehab  Date  08/01/17  Educator  [facility]  Instruction Review Code  1- Verbalizes Understanding      Falls Prevention: - Provides verbal and written material to individual with discussion of falls prevention and safety.   Cardiac Rehab from 08/14/2017 in [facility] Cardiac and Pulmonary Rehab  Date  08/01/17  Educator  [facility]  Instruction Review Code  1- Verbalizes Understanding      Diabetes: - Individual verbal and written instruction to review signs/symptoms of diabetes, desired ranges of glucose level fasting, after meals and with exercise. Acknowledge that pre and post exercise glucose checks will be done for 3 sessions at entry of program.   Know Your Numbers and Risk Factors: -Group verbal and written instruction about important numbers in your health.  Discussion of what are risk factors and how they play a role in the disease process.  Review of  Cholesterol, Blood Pressure, Diabetes, and BMI and the role they play in your overall health.   Sleep Hygiene: -Provides group verbal and written instruction about how sleep can affect your health.  Define sleep hygiene, discuss sleep cycles and impact of sleep habits. Review good sleep hygiene tips.    Other: -Provides group and verbal instruction on various topics (see comments)   Knowledge Questionnaire Score: Knowledge Questionnaire Score - 08/01/17 1413      Knowledge Questionnaire Score   Pre Score  18/28 correct answers reviewed with Simona Huh       Core Components/Risk Factors/Patient Goals at Admission: Personal Goals and Risk Factors at Admission - 08/01/17 1354      Core Components/Risk Factors/Patient Goals on Admission    Weight Management  Yes;Obesity;Weight Loss    Intervention  Weight Management: Develop a combined nutrition and  exercise program designed to reach desired caloric intake, while maintaining appropriate intake of nutrient and fiber, sodium and fats, and appropriate energy expenditure required for the weight goal.;Weight Management: Provide education and appropriate resources to help participant work on and attain dietary goals.;Weight Management/Obesity: Establish reasonable short term and long term weight goals.;Obesity: Provide education and appropriate resources to help participant work on and attain dietary goals.    Admit Weight  179 lb 3.2 oz (81.3 kg)    Goal Weight: Short Term  174 lb (78.9 kg)    Goal Weight: Long Term  165 lb (74.8 kg)    Expected Outcomes  Short Term: Continue to assess and modify interventions until short term weight is achieved;Long Term: Adherence to nutrition and physical activity/exercise program aimed toward attainment of established weight goal;Weight Loss: Understanding of general recommendations for a balanced deficit meal plan, which promotes 1-2 lb weight loss per week and includes a negative energy balance of [phone] kcal/d;Understanding recommendations for meals to include 15-35% energy as protein, 25-35% energy from fat, 35-60% energy from carbohydrates, less than 221m of dietary cholesterol, 20-35 gm of total fiber daily;Understanding of distribution of calorie intake throughout the day with the consumption of 4-5 meals/snacks    Hypertension  Yes    Intervention  Provide education on lifestyle modifcations including regular physical activity/exercise, weight management, moderate sodium restriction and increased consumption of fresh fruit, vegetables, and low fat dairy, alcohol moderation, and smoking cessation.;Monitor prescription use compliance.    Expected Outcomes  Short Term: Continued assessment and intervention until BP is < 140/925mHG in hypertensive participants. < 130/8063mG in hypertensive participants with diabetes, heart failure or chronic kidney disease.;Long  Term: Maintenance of blood pressure at goal levels.    Lipids  Yes    Intervention  Provide education and support for participant on nutrition & aerobic/resistive exercise along with prescribed medications to achieve LDL <53m64mDL >40mg42m Expected Outcomes  Short Term: Participant states understanding of desired cholesterol values and is compliant with medications prescribed. Participant is following exercise prescription and nutrition guidelines.;Long Term: Cholesterol controlled with medications as prescribed, with individualized exercise RX and with personalized nutrition plan. Value goals: LDL < 53mg,24m > 40 mg.    Stress  Yes DennisAlekressed about having a heart attack. He says he has never had any type of heart issues before, so this is all new.    Intervention  Offer individual and/or small group education and counseling on adjustment to heart disease, stress management and health-related lifestyle change. Teach and support self-help strategies.;Refer participants experiencing significant psychosocial distress to appropriate mental health specialists  for further evaluation and treatment. When possible, include family members and significant others in education/counseling sessions.    Expected Outcomes  Short Term: Participant demonstrates changes in health-related behavior, relaxation and other stress management skills, ability to obtain effective social support, and compliance with psychotropic medications if prescribed.;Long Term: Emotional wellbeing is indicated by absence of clinically significant psychosocial distress or social isolation.       Core Components/Risk Factors/Patient Goals Review:    Core Components/Risk Factors/Patient Goals at Discharge (Final Review):    ITP Comments: ITP Comments    Row Name 08/01/17 1344 08/21/17 0638         ITP Comments  Med review completed. Initial ITP created. Diagnosis can be found in [facility] 07/20/17  30 Day review. Continue with ITP  unless directed changes per Medical Director review.  New to program.  1/28 had another coronary stent placed, will wait for medical clearance to return to the program         Comments:

## 2017-08-21 NOTE — Telephone Encounter (Signed)
Left voicemail message to call back  

## 2017-08-22 ENCOUNTER — Other Ambulatory Visit: Payer: Self-pay | Admitting: Cardiovascular Disease

## 2017-08-22 MED ORDER — PREDNISONE 20 MG PO TABS: ORAL_TABLET | ORAL | 0 refills | Status: DC

## 2017-08-22 MED ORDER — METOPROLOL TARTRATE 25 MG PO TABS: 12.5000 mg | ORAL_TABLET | Freq: Two times a day (BID) | ORAL | 2 refills | Status: DC

## 2017-08-22 NOTE — Telephone Encounter (Signed)
Patient returning call. States he has had a rash on his tongue and itching on his back since 08/19/17 (Monday). He had heart cath by Dr Fletcher Anon on 1/28 as well. Allergy to shellfish.  Says his tongue has whelps on it but not swollen. No throat swelling or facial swelling or swelling anywhere else.  No difficulty breathing. From what I could tell patient was not started on any medications he was not already taking except maybe furosemide and potassium. Patient states he has not eaten anything different than usual. Patient said he was going to try to take a Benadryl this morning to see if that would help. Advised that would be fine. Advised I will route to Dr Fletcher Anon for review.

## 2017-08-22 NOTE — Telephone Encounter (Signed)
This is likely a delayed reaction to contrast dye.  We should give him prednisone 40 mg once daily for 4 days and he should take Benadryl 2-3 times per day.  If he experiences any tongue swelling or difficulty breathing, he should go to the emergency room. Add contrast dye to his allergies.

## 2017-08-22 NOTE — Telephone Encounter (Signed)
S/w patient. He verbalized understanding to take prednisone 40 mg by mouth daily for 4 days and benadryl 2-3 times per day and to go to ER if tongue swelling or difficulty breathing. Added to allergy list.  Rx sent to pharmacy.

## 2017-08-26 ENCOUNTER — Encounter: Payer: 59 | Attending: Cardiovascular Disease

## 2017-08-26 DIAGNOSIS — Z955 Presence of coronary angioplasty implant and graft: Secondary | ICD-10-CM | POA: Insufficient documentation

## 2017-08-26 DIAGNOSIS — Z48812 Encounter for surgical aftercare following surgery on the circulatory system: Secondary | ICD-10-CM | POA: Insufficient documentation

## 2017-08-26 DIAGNOSIS — I213 ST elevation (STEMI) myocardial infarction of unspecified site: Secondary | ICD-10-CM | POA: Insufficient documentation

## 2017-08-28 NOTE — Telephone Encounter (Signed)
Received Forms for md review.  Placed in nurse box

## 2017-08-29 ENCOUNTER — Telehealth: Payer: Self-pay | Admitting: Cardiovascular Disease

## 2017-08-29 ENCOUNTER — Telehealth: Payer: Self-pay

## 2017-08-29 NOTE — Telephone Encounter (Signed)
Patient FMLA/Disability Form from [employer] in Dr. Tyrell Antonio basket

## 2017-08-29 NOTE — Telephone Encounter (Signed)
LMOM

## 2017-08-30 ENCOUNTER — Encounter: Payer: Self-pay | Admitting: Cardiovascular Disease

## 2017-08-30 ENCOUNTER — Ambulatory Visit (INDEPENDENT_AMBULATORY_CARE_PROVIDER_SITE_OTHER): Payer: 59 | Admitting: Cardiovascular Disease

## 2017-08-30 VITALS — BP 126/72 | HR 59 | Ht 67.0 in | Wt 183.2 lb

## 2017-08-30 DIAGNOSIS — I25118 Atherosclerotic heart disease of native coronary artery with other forms of angina pectoris: Secondary | ICD-10-CM

## 2017-08-30 DIAGNOSIS — Z01812 Encounter for preprocedural laboratory examination: Secondary | ICD-10-CM | POA: Diagnosis not present

## 2017-08-30 DIAGNOSIS — E785 Hyperlipidemia, unspecified: Secondary | ICD-10-CM | POA: Diagnosis not present

## 2017-08-30 DIAGNOSIS — I5022 Chronic systolic (congestive) heart failure: Secondary | ICD-10-CM | POA: Diagnosis not present

## 2017-08-30 MED ORDER — PREDNISONE & DIPHENHYDRAMINE 3 X 50 MG & 1 X 50 MG PO KIT: 1.0000 | PACK | ORAL | 0 refills | Status: DC

## 2017-08-30 MED ORDER — PREDNISONE & DIPHENHYDRAMINE 3 X 50 MG & 1 X 50 MG PO KIT: 1.0000 | PACK | ORAL | 0 refills | Status: AC

## 2017-08-30 NOTE — Patient Instructions (Addendum)
Medication Instructions:  Your physician recommends that you continue on your current medications as directed. Please refer to the Current Medication list given to you today.  Pre-medication pack for contrast allergy: February 17, take prednisone, 1 tablet, at 5pm and 11pm February 18, take prednisone, 1 tablet, and benadryl, 1 tablet at 7am.    Labwork: BMET, CBC, PT/INR today  Testing/Procedures: Your physician has requested that you have a coronary angioplasty (PCI).  Doctors may recommend this procedure for a number of different reasons. The most common reason is to evaluate chest pain. Chest pain can be a symptom of coronary artery disease (CAD), and cardiac catheterization can show whether plaque is narrowing or blocking your heart's arteries. This procedure is also used to evaluate the valves, as well as measure the blood flow and oxygen levels in different parts of your heart. For further information please visit [email or web address]. Please follow instruction sheet, as given.  [place] Coronary Angioplasty/PCI Instructions   You are scheduled for a Coronary Angioplasty/PCI on: Monday, February 18  Please arrive at 8:30am on the day of your procedure  Please expect a call from our [facility] to pre-register you  Do not eat/drink anything after midnight  Someone will need to drive you home  It is recommended someone be with you for the first 24 hours after your procedure  Wear clothes that are easy to get on/off and wear slip on shoes if possible   Medications bring a current list of all medications with you  Do not take lasix the morning of your procedure.    Day of your procedure: Arrive at the [facility] entrance.  Free valet service is available.  After entering the [facility] please check-in at the registration desk (1st desk on your right) to receive your armband. After receiving your armband someone will escort you to the cardiac cath/special  procedures waiting area.  The usual length of stay after your procedure is about 2 to 3 hours.  This can vary.  If you have any questions, please call our office at [phone], or you may call the cardiac cath lab at [facility] directly at [phone]   Follow-Up: Your physician recommends that you schedule a follow-up appointment in: 1 month with Dr. Fletcher Anon.    Any Other Special Instructions Will Be Listed Below (If Applicable).     If you need a refill on your cardiac medications before your next appointment, please call your pharmacy.    Coronary Angioplasty Coronary angioplasty is a procedure to widen a narrowed or blocked blood vessel of the heart (coronary artery). The artery is usually blocked by cholesterol buildup (plaques) in the lining of the artery walls. When a vessel in the heart becomes partially blocked, there is decreased blood flow to that area. This may lead to chest pain or a heart attack (myocardial infarction). Tell a health care provider about:  Any allergies you have, including allergies to shellfish or contrast dye.  All medicines you are taking, including vitamins, herbs, eye drops, creams, and over-the-counter medicines.  Any problems you or family members have had with anesthetic medicines.  Any blood disorders you have.  Any surgeries you have had.  Any medical conditions you have.  Whether you are pregnant or may be pregnant. What are the risks? Generally, this is a safe procedure. However, problems may occur, including:  Damage to other structures or organs. This may include damage to blood vessels, leading to rupture or bleeding.  Infection, bleeding, or  bruising at the site where a small, thin tube (catheter) will be inserted.  Allergic reaction to the dye or contrast that is used.  Kidney damage from the dye or contrast that is used.  Blood clots that can lead to a stroke or heart attack.  Bleeding into the abdomen (retroperitoneal  bleeding).  What happens before the procedure? Staying hydrated Follow instructions from your health care provider about hydration, which may include:  Up to 2 hours before the procedure - you may continue to drink clear liquids, such as water, clear fruit juice, black coffee, and plain tea.  Eating and drinking restrictions Follow instructions from your health care provider about eating and drinking, which may include:  8 hours before the procedure - stop eating heavy meals or foods such as meat, fried foods, or fatty foods.  6 hours before the procedure - stop eating light meals or foods, such as toast or cereal.  2 hours before the procedure - stop drinking clear liquids.  Medicines  Ask your health care provider about: ? Changing or stopping your regular medicines. This is especially important if you are taking diabetes medicines or blood thinners. ? Whether aspirin is recommended before this procedure.  Ask your health care provider if you can take a sip of water with any approved medicines the morning of the procedure. General instructions  Plan to have someone take you home from the hospital or clinic.  If you will be going home right after the procedure, plan to have someone with you for 24 hours. What happens during the procedure?  To reduce your risk of infection: ? Your health care team will wash or sanitize their hands. ? A germ-killing solution (antiseptic) will be used to wash the area where the catheter will be inserted. Hair may be removed from this area. The catheter may be inserted in:  Your groin area. This is the most common area.  The fold of your arm, near your elbow.  Your wrist.  An IV tube will be inserted into one of your veins.  You will be given a medicine to help you relax (sedative).  You will be given a medicine to numb the area where the catheter will be inserted (local anesthetic).  The catheter will be inserted into an artery.  The  catheter will be guided to the narrowed or blocked artery using a type of X-ray (fluoroscopy).  When the catheter is near the heart, dye will be injected that makes the narrowing or blockage visible on the X-ray.  Once the catheter is positioned at the narrowed or blocked portion of the blood vessel, a balloon will be inflated to make the artery wider. Expanding the balloon will crush the plaques into the wall of the vessel and improve the blood flow.  The artery may be made wider by removing plaques using a drill, laser, or other tools.  When the blood flow is better, the balloon will be deflated and the catheter will be removed.  A stent may be placed. This is common in this procedure.  After the catheter is removed, a special dressing will be placed over the insertion site. What happens after the procedure?  You will need to keep the area still for a few hours, or as long as directed by your health care provider. If the procedure was done in the groin, you will be instructed not to bend or cross your legs.  The insertion site will be checked often.  The pulse  in your feet or wrist will be checked often.  Additional blood tests, X-rays, and an electrocardiogram (ECG) may be done.  Do not drive for 24 hours if you were given a sedative. This information is not intended to replace advice given to you by your health care provider. Make sure you discuss any questions you have with your health care provider. Document Released: 07/06/2000 Document Revised: 04/06/2016 Document Reviewed: 04/06/2016 Elsevier Interactive Patient Education  2018 [employer].  Coronary Angioplasty, Care After This sheet gives you information about how to care for yourself after your procedure. Your health care provider may also give you more specific instructions. If you have problems or questions, contact your health care provider. What can I expect after the procedure? After your procedure, it is common to  have:  Bruising at the catheter insertion site. This usually fades within 1-2 weeks.  Blood collecting in the tissue (hematoma) that may be painful to the touch. It should become smaller and less tender within 1-2 weeks.  Follow these instructions at home: Medicines  Take over-the-counter and prescription medicines only as told by your health care provider.  Blood thinners may be prescribed after your procedure to improve blood flow. Bathing  You may shower 24-48 hours after the procedure or as told by your health care provider.  Do not take baths, swim, or use a hot tub until your health care provider approves. Insertion site care  Follow instructions from your health care provider about how to take care of your insertion site. Make sure you: ? Wash your hands with soap and water before you change your bandage (dressing). If soap and water are not available, use hand sanitizer. ? Change your dressing as told by your health care provider. ? Gently wash the site with plain soap and water. ? Use a clean towel to pat the area dry. ? Do not rub the site, because this may cause bleeding. ? Do not apply powder or lotion to the site.  Check your insertion site every day for signs of infection. Check for: ? More redness, swelling, or pain. ? More fluid or blood. ? Warmth. ? Pus or a bad smell. Lifestyle  Make any lifestyle changes as recommended by your health care provider. This may include: ? Not using any products that contain nicotine or tobacco, such as cigarettes and e-cigarettes. If you need help quitting, ask your health care provider. ? Managing your weight. ? Getting regular exercise. ? Managing your blood pressure. ? Limiting your alcohol intake. ? Managing other health problems, such as diabetes.  Eat a heart-healthy diet. This should include plenty of fresh fruits and vegetables. Avoid foods that are: ? High in salt (sodium). ? Canned or highly processed. ? High in  saturated fat or sugar. ? Fried. General instructions  Do not lift over 10 lb (4.5 kg) for 5 days after your procedure or as told by your health care provider.  Ask your health care provider when it is okay to: ? Return to work or school. ? Resume usual physical activities or sports. ? Resume sexual activity.  Keep all follow-up visits as told by your health care provider. This is important. Contact a health care provider if:  You have a fever.  You have chills.  You have increased bleeding from the insertion site. Hold pressure on the site. Get help right away if:  You develop chest pain or shortness of breath, feel faint, or pass out.  You have unusual pain  at the insertion site.  You have redness, warmth, or swelling at the insertion site.  You have drainage (other than a small amount of blood on the dressing) from the insertion site.  The insertion site is bleeding, and the bleeding does not stop after 30 minutes of holding steady pressure on the site.  You develop bleeding from any other place, such as from the rectum. There may be bright red blood in your urine or stool, or it may appear as black, tarry stool. This information is not intended to replace advice given to you by your health care provider. Make sure you discuss any questions you have with your health care provider. Document Released: 01/25/2005 Document Revised: 11/05/2016 Document Reviewed: 02/12/2016 Elsevier Interactive Patient Education  [employer].

## 2017-08-30 NOTE — H&P (View-Only) (Signed)
Cardiology Office Note   Date:  08/30/2017   ID:  Todd [facility], DOB Jan 18, 1951, MRN [id number]  PCP:  Todd Potter, MD  Cardiologist:   Todd Sacramento, MD   Chief Complaint  Patient presents with  . Other    follow up post cath. Meds reviewed verbally with patient.       History of Present Illness: Todd Weaver is a 67 y.o. male who presents for a follow-up visit regarding coronary artery disease.  He had inferior ST elevation myocardial infarction complicated by ventricular fibrillation arrest in December 2018 which was treated successfully with PCI and 2 overlapped drug-eluting stent placement to the right coronary artery.  He was also found to have significant ostial LAD stenosis, borderline significant proximal LAD stenosis and moderate distal LAD stenosis.  Ejection fraction was mildly reduced.  He had atrial fibrillation after the procedure that was treated successfully with amiodarone. He has other chronic medical conditions that include previous stroke, hyperlipidemia, previous tobacco use and BPH. The patient developed some exertional chest pain and shortness of breath while at cardiac rehab with left arm numbness. I proceeded with repeat cardiac catheterization which showed widely patent RCA stents with no significant restenosis.  The disease in the LAD was unchanged but the ostial LAD stenosis was concerning given that the lesion appeared very eccentric and possibly ulcerated. Amiodarone was stopped during last visit with no evidence of recurrent atrial fibrillation.    Past Medical History:  Diagnosis Date  . Acute ST elevation myocardial infarction (STEMI) of inferior wall ([place]) 07/18/2017  . CAD (coronary artery disease)    a. nuc stress test 9/17: small defect of mild severity in the apex location, partially reversible and suspected to be due to artifact given intense GI uptake, EF 55-65%, low risk study; b. inferior STEMI 07/18/17  . GERD (gastroesophageal  reflux disease)   . Hemorrhoid   . Hyperlipidemia   . Hypertension   . Stroke [facility]) 2011   "2 MINI STROKES"    Past Surgical History:  Procedure Laterality Date  . ANAL FISTULECTOMY N/A 12/21/2014   Procedure: FISTULECTOMY ANAL;  Surgeon: Todd Lye, MD;  Location: [facility];  Service: General;  Laterality: N/A;  . APPENDECTOMY  1975  . CORONARY/GRAFT ACUTE MI REVASCULARIZATION N/A 07/18/2017   Procedure: Coronary/Graft Acute MI Revascularization;  Surgeon: Todd Hampshire, MD;  Location: [place] [facility];  Service: Cardiovascular;  Laterality: N/A;  . HEMORRHOID SURGERY N/A 12/21/2014   Procedure: HEMORRHOIDECTOMY;  Surgeon: Todd Lye, MD;  Location: [facility];  Service: General;  Laterality: N/A;  . KNEE SURGERY Right age 80  . LEFT HEART CATH AND CORONARY ANGIOGRAPHY N/A 07/18/2017   Procedure: LEFT HEART CATH AND CORONARY ANGIOGRAPHY;  Surgeon: Todd Hampshire, MD;  Location: [place] [facility];  Service: Cardiovascular;  Laterality: N/A;  . LEFT HEART CATH AND CORONARY ANGIOGRAPHY Left 08/19/2017   Procedure: LEFT HEART CATH AND CORONARY ANGIOGRAPHY;  Surgeon: Todd Hampshire, MD;  Location: [place] [facility];  Service: Cardiovascular;  Laterality: Left;  . ROTATOR CUFF REPAIR Right 2013     Current Outpatient Medications  Medication Sig Dispense Refill  . aspirin EC 81 MG tablet Take 81 mg by mouth once.     Marland Kitchen atorvastatin (LIPITOR) 80 MG tablet Take 1 tablet (80 mg total) by mouth daily at 6 PM. 30 tablet 0  . furosemide (LASIX) 20 MG tablet Take 1 tablet (20 mg total) by mouth daily.  30 tablet 0  . losartan (COZAAR) 25 MG tablet Take 0.5 tablets (12.5 mg total) by mouth daily. 30 tablet 0  . magnesium oxide (MAG-OX) 400 (241.3 Mg) MG tablet Take 1 tablet (400 mg total) by mouth daily. 30 tablet 0  . metoprolol tartrate (LOPRESSOR) 25 MG tablet Take 0.5 tablets (12.5 mg total) by mouth 2 (two) times daily. 30 tablet 2  . nitroGLYCERIN  (NITROSTAT) 0.4 MG SL tablet Place 1 tablet (0.4 mg total) under the tongue every 5 (five) minutes as needed for chest pain. 30 tablet 0  . potassium chloride (K-DUR,KLOR-CON) 10 MEQ tablet Take 1 tablet (10 mEq total) by mouth daily. 30 tablet 0  . ticagrelor (BRILINTA) 90 MG TABS tablet Take 1 tablet (90 mg total) by mouth 2 (two) times daily. 60 tablet 0   No current facility-administered medications for this visit.     Allergies:   Codeine; Shellfish allergy; and Contrast media [iodinated diagnostic agents]    Social History:  The patient  reports that he quit smoking about 10 years ago. he has never used smokeless tobacco. He reports that he drinks alcohol. He reports that he does not use drugs.   Family History:  The patient's family history includes Alzheimer's disease in his father and mother; Hypertension in his father and mother.    ROS:  Please see the history of present illness.   Otherwise, review of systems are positive for none.   All other systems are reviewed and negative.    PHYSICAL EXAM: VS:  BP 126/72 (BP Location: Left Arm, Patient Position: Sitting, Cuff Size: Normal)   Pulse (!) 59   Ht 5\' 7"  (1.702 m)   Wt 183 lb 4 oz (83.1 kg)   BMI 28.70 kg/m  , BMI Body mass index is 28.7 kg/m. GEN: Well nourished, well developed, in no acute distress  HEENT: normal  Neck: no JVD, carotid bruits, or masses Cardiac: RRR; no murmurs, rubs, or gallops,no edema  Respiratory:  clear to auscultation bilaterally, normal work of breathing GI: soft, nontender, nondistended, + BS MS: no deformity or atrophy  Skin: warm and dry, no rash Neuro:  Strength and sensation are intact Psych: euthymic mood, full affect Right radial pulse is normal with no hematoma  EKG:  EKG is ordered today. The ekg ordered today demonstrates normal sinus rhythm with no significant ST or T wave changes   Recent Labs: 07/18/2017: ALT 31 07/19/2017: TSH 1.232 07/20/2017: Magnesium 1.9 08/15/2017:  BUN 14; Creatinine, Ser 1.13; Hemoglobin 14.1; Platelets 265; Potassium 3.9; Sodium 138    Lipid Panel    Component Value Date/Time   CHOL 216 (H) 07/18/2017 0645   CHOL 223 (H) 07/17/2017 1610   TRIG 106 07/18/2017 0645   HDL 66 07/18/2017 0645   HDL 73 07/17/2017 1610   CHOLHDL 3.3 07/18/2017 0645   VLDL 21 07/18/2017 0645   LDLCALC 129 (H) 07/18/2017 0645   LDLCALC 130 (H) 07/17/2017 1610      Wt Readings from Last 3 Encounters:  08/30/17 183 lb 4 oz (83.1 kg)  08/19/17 181 lb (82.1 kg)  08/15/17 181 lb 8 oz (82.3 kg)       PAD Screen 03/13/2016  Previous PAD dx? No  Previous surgical procedure? No  Pain with walking? No  Feet/toe relief with dangling? Yes  Painful, non-healing ulcers? No  Extremities discolored? No      ASSESSMENT AND PLAN:  1.  Coronary artery disease involving native coronary arteries with other  forms of angina: I reviewed most recent cardiac catheterization with him.  The ostial LAD stenosis is concerning given its morphology.  I discussed different management options including one-vessel CABG versus PCI.  Given that he is on dual antiplatelet therapy for recent myocardial infarction, it would be difficult to interrupt dual antiplatelet therapy before 6 months.  Thus, that makes CABG difficult.  I do not think we can leave the stenosis is for 6 months without treatment. I did review the catheterization with Dr. Burt Knack for a second opinion.  I think the LAD is approachable percutaneously.  He would require treatment of the proximal and ostial LAD either with one long stent or with 2 separate stents given that there is a relatively healthy segment between the 2 stenotic areas. I discussed the procedure in details as well as risks and benefits. He is to stay off work and cardiac rehab until then.  I explained to him that if everything goes as planned without complications, he can resume work 1 week after PCI. The patient did have mild rash after most  recent contrast exposure that was treated successfully with prednisone and Benadryl.  He will be pretreated before the procedure.  2.  Chronic systolic heart failure: He appears to be euvolemic.  Continue same medications.  3.  Paroxysmal atrial fibrillation: Only noted post myocardial infarction with hypotension.  No evidence of recurrent arrhythmia off amiodarone.  If he develops recurrent atrial fibrillation in the future, long-term anticoagulation can be considered.  4.  Hyperlipidemia: Currently on high-dose atorvastatin.  He will need a follow-up lipid and liver profile in the near future.   Disposition:   FU with me in 1 month  Signed,  Todd Sacramento, MD  08/30/2017 11:47 AM    [place]

## 2017-08-30 NOTE — Progress Notes (Signed)
Cardiology Office Note   Date:  08/30/2017   ID:  Todd [facility], DOB 13-Jul-1951, MRN [id number]  PCP:  Adline Potter, MD  Cardiologist:   Kathlyn Sacramento, MD   Chief Complaint  Patient presents with  . Other    follow up post cath. Meds reviewed verbally with patient.       History of Present Illness: [facility] is a 67 y.o. male who presents for a follow-up visit regarding coronary artery disease.  He had inferior ST elevation myocardial infarction complicated by ventricular fibrillation arrest in December 2018 which was treated successfully with PCI and 2 overlapped drug-eluting stent placement to the right coronary artery.  He was also found to have significant ostial LAD stenosis, borderline significant proximal LAD stenosis and moderate distal LAD stenosis.  Ejection fraction was mildly reduced.  He had atrial fibrillation after the procedure that was treated successfully with amiodarone. He has other chronic medical conditions that include previous stroke, hyperlipidemia, previous tobacco use and BPH. The patient developed some exertional chest pain and shortness of breath while at cardiac rehab with left arm numbness. I proceeded with repeat cardiac catheterization which showed widely patent RCA stents with no significant restenosis.  The disease in the LAD was unchanged but the ostial LAD stenosis was concerning given that the lesion appeared very eccentric and possibly ulcerated. Amiodarone was stopped during last visit with no evidence of recurrent atrial fibrillation.    Past Medical History:  Diagnosis Date  . Acute ST elevation myocardial infarction (STEMI) of inferior wall ([place]) 07/18/2017  . CAD (coronary artery disease)    a. nuc stress test 9/17: small defect of mild severity in the apex location, partially reversible and suspected to be due to artifact given intense GI uptake, EF 55-65%, low risk study; b. inferior STEMI 07/18/17  . GERD (gastroesophageal  reflux disease)   . Hemorrhoid   . Hyperlipidemia   . Hypertension   . Stroke [facility]) 2011   "2 MINI STROKES"    Past Surgical History:  Procedure Laterality Date  . ANAL FISTULECTOMY N/A 12/21/2014   Procedure: FISTULECTOMY ANAL;  Surgeon: Christene Lye, MD;  Location: [facility];  Service: General;  Laterality: N/A;  . APPENDECTOMY  1975  . CORONARY/GRAFT ACUTE MI REVASCULARIZATION N/A 07/18/2017   Procedure: Coronary/Graft Acute MI Revascularization;  Surgeon: Wellington Hampshire, MD;  Location: [place] [facility];  Service: Cardiovascular;  Laterality: N/A;  . HEMORRHOID SURGERY N/A 12/21/2014   Procedure: HEMORRHOIDECTOMY;  Surgeon: Christene Lye, MD;  Location: [facility];  Service: General;  Laterality: N/A;  . KNEE SURGERY Right age 59  . LEFT HEART CATH AND CORONARY ANGIOGRAPHY N/A 07/18/2017   Procedure: LEFT HEART CATH AND CORONARY ANGIOGRAPHY;  Surgeon: Wellington Hampshire, MD;  Location: [place] [facility];  Service: Cardiovascular;  Laterality: N/A;  . LEFT HEART CATH AND CORONARY ANGIOGRAPHY Left 08/19/2017   Procedure: LEFT HEART CATH AND CORONARY ANGIOGRAPHY;  Surgeon: Wellington Hampshire, MD;  Location: [place] [facility];  Service: Cardiovascular;  Laterality: Left;  . ROTATOR CUFF REPAIR Right 2013     Current Outpatient Medications  Medication Sig Dispense Refill  . aspirin EC 81 MG tablet Take 81 mg by mouth once.     Marland Kitchen atorvastatin (LIPITOR) 80 MG tablet Take 1 tablet (80 mg total) by mouth daily at 6 PM. 30 tablet 0  . furosemide (LASIX) 20 MG tablet Take 1 tablet (20 mg total) by mouth daily.  30 tablet 0  . losartan (COZAAR) 25 MG tablet Take 0.5 tablets (12.5 mg total) by mouth daily. 30 tablet 0  . magnesium oxide (MAG-OX) 400 (241.3 Mg) MG tablet Take 1 tablet (400 mg total) by mouth daily. 30 tablet 0  . metoprolol tartrate (LOPRESSOR) 25 MG tablet Take 0.5 tablets (12.5 mg total) by mouth 2 (two) times daily. 30 tablet 2  . nitroGLYCERIN  (NITROSTAT) 0.4 MG SL tablet Place 1 tablet (0.4 mg total) under the tongue every 5 (five) minutes as needed for chest pain. 30 tablet 0  . potassium chloride (K-DUR,KLOR-CON) 10 MEQ tablet Take 1 tablet (10 mEq total) by mouth daily. 30 tablet 0  . ticagrelor (BRILINTA) 90 MG TABS tablet Take 1 tablet (90 mg total) by mouth 2 (two) times daily. 60 tablet 0   No current facility-administered medications for this visit.     Allergies:   Codeine; Shellfish allergy; and Contrast media [iodinated diagnostic agents]    Social History:  The patient  reports that he quit smoking about 10 years ago. he has never used smokeless tobacco. He reports that he drinks alcohol. He reports that he does not use drugs.   Family History:  The patient's family history includes Alzheimer's disease in his father and mother; Hypertension in his father and mother.    ROS:  Please see the history of present illness.   Otherwise, review of systems are positive for none.   All other systems are reviewed and negative.    PHYSICAL EXAM: VS:  BP 126/72 (BP Location: Left Arm, Patient Position: Sitting, Cuff Size: Normal)   Pulse (!) 59   Ht 5\' 7"  (1.702 m)   Wt 183 lb 4 oz (83.1 kg)   BMI 28.70 kg/m  , BMI Body mass index is 28.7 kg/m. GEN: Well nourished, well developed, in no acute distress  HEENT: normal  Neck: no JVD, carotid bruits, or masses Cardiac: RRR; no murmurs, rubs, or gallops,no edema  Respiratory:  clear to auscultation bilaterally, normal work of breathing GI: soft, nontender, nondistended, + BS MS: no deformity or atrophy  Skin: warm and dry, no rash Neuro:  Strength and sensation are intact Psych: euthymic mood, full affect Right radial pulse is normal with no hematoma  EKG:  EKG is ordered today. The ekg ordered today demonstrates normal sinus rhythm with no significant ST or T wave changes   Recent Labs: 07/18/2017: ALT 31 07/19/2017: TSH 1.232 07/20/2017: Magnesium 1.9 08/15/2017:  BUN 14; Creatinine, Ser 1.13; Hemoglobin 14.1; Platelets 265; Potassium 3.9; Sodium 138    Lipid Panel    Component Value Date/Time   CHOL 216 (H) 07/18/2017 0645   CHOL 223 (H) 07/17/2017 1610   TRIG 106 07/18/2017 0645   HDL 66 07/18/2017 0645   HDL 73 07/17/2017 1610   CHOLHDL 3.3 07/18/2017 0645   VLDL 21 07/18/2017 0645   LDLCALC 129 (H) 07/18/2017 0645   LDLCALC 130 (H) 07/17/2017 1610      Wt Readings from Last 3 Encounters:  08/30/17 183 lb 4 oz (83.1 kg)  08/19/17 181 lb (82.1 kg)  08/15/17 181 lb 8 oz (82.3 kg)       PAD Screen 03/13/2016  Previous PAD dx? No  Previous surgical procedure? No  Pain with walking? No  Feet/toe relief with dangling? Yes  Painful, non-healing ulcers? No  Extremities discolored? No      ASSESSMENT AND PLAN:  1.  Coronary artery disease involving native coronary arteries with other  forms of angina: I reviewed most recent cardiac catheterization with him.  The ostial LAD stenosis is concerning given its morphology.  I discussed different management options including one-vessel CABG versus PCI.  Given that he is on dual antiplatelet therapy for recent myocardial infarction, it would be difficult to interrupt dual antiplatelet therapy before 6 months.  Thus, that makes CABG difficult.  I do not think we can leave the stenosis is for 6 months without treatment. I did review the catheterization with Dr. Burt Knack for a second opinion.  I think the LAD is approachable percutaneously.  He would require treatment of the proximal and ostial LAD either with one long stent or with 2 separate stents given that there is a relatively healthy segment between the 2 stenotic areas. I discussed the procedure in details as well as risks and benefits. He is to stay off work and cardiac rehab until then.  I explained to him that if everything goes as planned without complications, he can resume work 1 week after PCI. The patient did have mild rash after most  recent contrast exposure that was treated successfully with prednisone and Benadryl.  He will be pretreated before the procedure.  2.  Chronic systolic heart failure: He appears to be euvolemic.  Continue same medications.  3.  Paroxysmal atrial fibrillation: Only noted post myocardial infarction with hypotension.  No evidence of recurrent arrhythmia off amiodarone.  If he develops recurrent atrial fibrillation in the future, long-term anticoagulation can be considered.  4.  Hyperlipidemia: Currently on high-dose atorvastatin.  He will need a follow-up lipid and liver profile in the near future.   Disposition:   FU with me in 1 month  Signed,  Kathlyn Sacramento, MD  08/30/2017 11:47 AM    [place]

## 2017-08-31 LAB — PROTIME-INR
INR: 1 (ref 0.8–1.2)
Prothrombin Time: 10.7 s (ref 9.1–12.0)

## 2017-08-31 LAB — BASIC METABOLIC PANEL
BUN / CREAT RATIO: 12 (ref 10–24)
BUN: 13 mg/dL (ref 8–27)
CO2: 21 mmol/L (ref 20–29)
CREATININE: 1.06 mg/dL (ref 0.76–1.27)
Calcium: 9.7 mg/dL (ref 8.6–10.2)
Chloride: 102 mmol/L (ref 96–106)
GFR calc non Af Amer: 74 mL/min/{1.73_m2} (ref >59)
GFR, EST AFRICAN AMERICAN: 85 mL/min/{1.73_m2} (ref >59)
GLUCOSE: 92 mg/dL (ref 65–99)
Potassium: 4.1 mmol/L (ref 3.5–5.2)
Sodium: 140 mmol/L (ref 134–144)

## 2017-08-31 LAB — CBC
HEMATOCRIT: 42.6 % (ref 37.5–51.0)
Hemoglobin: 14.4 g/dL (ref 13.0–17.7)
MCH: 28.6 pg (ref 26.6–33.0)
MCHC: 33.8 g/dL (ref 31.5–35.7)
MCV: 85 fL (ref 79–97)
Platelets: 306 10*3/uL (ref 150–379)
RBC: 5.03 x10E6/uL (ref 4.14–5.80)
RDW: 14.9 % (ref 12.3–15.4)
WBC: 7 10*3/uL (ref 3.4–10.8)

## 2017-09-09 ENCOUNTER — Other Ambulatory Visit: Payer: Self-pay

## 2017-09-09 ENCOUNTER — Encounter
Admission: RE | Disposition: A | Discharge status: Home / Self Care | Payer: Self-pay | Source: Ambulatory Visit | Attending: Cardiovascular Disease

## 2017-09-09 ENCOUNTER — Observation Stay
Admission: RE | Admit: 2017-09-09 | Discharge: 2017-09-10 | Disposition: A | Discharge status: Home / Self Care | Payer: No Typology Code available for payment source | Source: Ambulatory Visit | Attending: Cardiovascular Disease | Admitting: Cardiovascular Disease

## 2017-09-09 DIAGNOSIS — Z8673 Personal history of transient ischemic attack (TIA), and cerebral infarction without residual deficits: Secondary | ICD-10-CM | POA: Diagnosis not present

## 2017-09-09 DIAGNOSIS — I110 Hypertensive heart disease with heart failure: Secondary | ICD-10-CM | POA: Diagnosis not present

## 2017-09-09 DIAGNOSIS — N400 Enlarged prostate without lower urinary tract symptoms: Secondary | ICD-10-CM | POA: Insufficient documentation

## 2017-09-09 DIAGNOSIS — I25118 Atherosclerotic heart disease of native coronary artery with other forms of angina pectoris: Secondary | ICD-10-CM

## 2017-09-09 DIAGNOSIS — E785 Hyperlipidemia, unspecified: Secondary | ICD-10-CM | POA: Diagnosis not present

## 2017-09-09 DIAGNOSIS — Z91013 Allergy to seafood: Secondary | ICD-10-CM | POA: Insufficient documentation

## 2017-09-09 DIAGNOSIS — I5022 Chronic systolic (congestive) heart failure: Secondary | ICD-10-CM | POA: Insufficient documentation

## 2017-09-09 DIAGNOSIS — I7090 Unspecified atherosclerosis: Secondary | ICD-10-CM

## 2017-09-09 DIAGNOSIS — Z955 Presence of coronary angioplasty implant and graft: Secondary | ICD-10-CM | POA: Insufficient documentation

## 2017-09-09 DIAGNOSIS — I252 Old myocardial infarction: Secondary | ICD-10-CM | POA: Insufficient documentation

## 2017-09-09 DIAGNOSIS — Z5309 Procedure and treatment not carried out because of other contraindication: Secondary | ICD-10-CM | POA: Diagnosis present

## 2017-09-09 DIAGNOSIS — I2510 Atherosclerotic heart disease of native coronary artery without angina pectoris: Principal | ICD-10-CM | POA: Insufficient documentation

## 2017-09-09 DIAGNOSIS — Z885 Allergy status to narcotic agent status: Secondary | ICD-10-CM | POA: Insufficient documentation

## 2017-09-09 HISTORY — PX: CORONARY STENT INTERVENTION: CATH118234

## 2017-09-09 HISTORY — PX: INTRAVASCULAR ULTRASOUND/IVUS: CATH118244

## 2017-09-09 LAB — POCT ACTIVATED CLOTTING TIME: ACTIVATED CLOTTING TIME: 274 s

## 2017-09-09 LAB — MRSA PCR SCREENING: MRSA BY PCR: NEGATIVE

## 2017-09-09 LAB — GLUCOSE, CAPILLARY: GLUCOSE-CAPILLARY: 175 mg/dL — AB (ref 65–99)

## 2017-09-09 SURGERY — CORONARY STENT INTERVENTION
Anesthesia: Moderate Sedation

## 2017-09-09 MED ORDER — NITROGLYCERIN 5 MG/ML IV SOLN
INTRAVENOUS | Status: AC
  Filled 2017-09-09: qty 10

## 2017-09-09 MED ORDER — SODIUM CHLORIDE 0.9 % WEIGHT BASED INFUSION
1.0000 mL/kg/h | INTRAVENOUS | Status: AC
  Administered 2017-09-09: 1 mL/kg/h via INTRAVENOUS

## 2017-09-09 MED ORDER — IOPAMIDOL (ISOVUE-300) INJECTION 61%
INTRAVENOUS | Status: DC | PRN
  Administered 2017-09-09: 115 mL via INTRA_ARTERIAL

## 2017-09-09 MED ORDER — ACETAMINOPHEN 325 MG PO TABS: 650.0000 mg | ORAL_TABLET | ORAL | Status: DC | PRN

## 2017-09-09 MED ORDER — LOSARTAN POTASSIUM 25 MG PO TABS
12.5000 mg | ORAL_TABLET | Freq: Every day | ORAL | Status: DC
  Administered 2017-09-10: 12.5 mg via ORAL
  Filled 2017-09-09: qty 1

## 2017-09-09 MED ORDER — HEPARIN (PORCINE) IN NACL 2-0.9 UNIT/ML-% IJ SOLN
INTRAMUSCULAR | Status: AC
  Filled 2017-09-09: qty 1000

## 2017-09-09 MED ORDER — SODIUM CHLORIDE 0.9 % IV SOLN
INTRAVENOUS | Status: DC
  Administered 2017-09-09: 1000 mL via INTRAVENOUS

## 2017-09-09 MED ORDER — TICAGRELOR 90 MG PO TABS
90.0000 mg | ORAL_TABLET | Freq: Two times a day (BID) | ORAL | Status: DC
  Administered 2017-09-09 – 2017-09-10 (×2): 90 mg via ORAL
  Filled 2017-09-09 (×2): qty 1

## 2017-09-09 MED ORDER — SODIUM CHLORIDE 0.9% FLUSH
3.0000 mL | Freq: Two times a day (BID) | INTRAVENOUS | Status: DC
  Administered 2017-09-09: 3 mL via INTRAVENOUS

## 2017-09-09 MED ORDER — NITROGLYCERIN 1 MG/10 ML FOR IR/CATH LAB
INTRA_ARTERIAL | Status: DC | PRN
  Administered 2017-09-09: 200 ug via INTRACORONARY

## 2017-09-09 MED ORDER — FUROSEMIDE 20 MG PO TABS
20.0000 mg | ORAL_TABLET | Freq: Every day | ORAL | Status: DC
  Administered 2017-09-09 – 2017-09-10 (×2): 20 mg via ORAL
  Filled 2017-09-09 (×2): qty 1

## 2017-09-09 MED ORDER — VERAPAMIL HCL 2.5 MG/ML IV SOLN
INTRAVENOUS | Status: AC
  Filled 2017-09-09: qty 2

## 2017-09-09 MED ORDER — ASPIRIN 81 MG PO CHEW: 81.0000 mg | CHEWABLE_TABLET | ORAL | Status: DC

## 2017-09-09 MED ORDER — MIDAZOLAM HCL 2 MG/2ML IJ SOLN
INTRAMUSCULAR | Status: DC | PRN
  Administered 2017-09-09: 1 mg via INTRAVENOUS

## 2017-09-09 MED ORDER — POTASSIUM CHLORIDE CRYS ER 20 MEQ PO TBCR
10.0000 meq | EXTENDED_RELEASE_TABLET | Freq: Every day | ORAL | Status: DC
  Administered 2017-09-10: 10 meq via ORAL
  Filled 2017-09-09 (×2): qty 1

## 2017-09-09 MED ORDER — SODIUM CHLORIDE 0.9% FLUSH: 3.0000 mL | INTRAVENOUS | Status: DC | PRN

## 2017-09-09 MED ORDER — FENTANYL CITRATE (PF) 100 MCG/2ML IJ SOLN
INTRAMUSCULAR | Status: AC
  Filled 2017-09-09: qty 2

## 2017-09-09 MED ORDER — TAMSULOSIN HCL 0.4 MG PO CAPS
0.4000 mg | ORAL_CAPSULE | Freq: Every day | ORAL | Status: DC
  Administered 2017-09-10: 0.4 mg via ORAL
  Filled 2017-09-09 (×2): qty 1

## 2017-09-09 MED ORDER — SODIUM CHLORIDE 0.9 % IV SOLN: 250.0000 mL | INTRAVENOUS | Status: DC | PRN

## 2017-09-09 MED ORDER — HEPARIN SODIUM (PORCINE) 1000 UNIT/ML IJ SOLN
INTRAMUSCULAR | Status: DC | PRN
  Administered 2017-09-09: 8000 [IU] via INTRAVENOUS

## 2017-09-09 MED ORDER — HEPARIN SODIUM (PORCINE) 1000 UNIT/ML IJ SOLN
INTRAMUSCULAR | Status: AC
  Filled 2017-09-09: qty 1

## 2017-09-09 MED ORDER — NITROGLYCERIN 0.4 MG SL SUBL: 0.4000 mg | SUBLINGUAL_TABLET | SUBLINGUAL | Status: DC | PRN

## 2017-09-09 MED ORDER — METOPROLOL TARTRATE 25 MG PO TABS
12.5000 mg | ORAL_TABLET | Freq: Two times a day (BID) | ORAL | Status: DC
  Administered 2017-09-09 – 2017-09-10 (×2): 12.5 mg via ORAL
  Filled 2017-09-09 (×2): qty 1

## 2017-09-09 MED ORDER — VERAPAMIL HCL 2.5 MG/ML IV SOLN
INTRAVENOUS | Status: DC | PRN
  Administered 2017-09-09: 2.5 mg via INTRA_ARTERIAL

## 2017-09-09 MED ORDER — DIPHENHYDRAMINE HCL 50 MG/ML IJ SOLN
INTRAMUSCULAR | Status: DC | PRN
  Administered 2017-09-09: 25 mg via INTRAVENOUS

## 2017-09-09 MED ORDER — FENTANYL CITRATE (PF) 100 MCG/2ML IJ SOLN
INTRAMUSCULAR | Status: DC | PRN
  Administered 2017-09-09: 25 ug via INTRAVENOUS

## 2017-09-09 MED ORDER — ATORVASTATIN CALCIUM 80 MG PO TABS
80.0000 mg | ORAL_TABLET | Freq: Every day | ORAL | Status: DC
  Administered 2017-09-09 – 2017-09-10 (×2): 80 mg via ORAL
  Filled 2017-09-09: qty 4
  Filled 2017-09-09: qty 1

## 2017-09-09 MED ORDER — ONDANSETRON HCL 4 MG/2ML IJ SOLN: 4.0000 mg | Freq: Four times a day (QID) | INTRAMUSCULAR | Status: DC | PRN

## 2017-09-09 MED ORDER — MIDAZOLAM HCL 2 MG/2ML IJ SOLN
INTRAMUSCULAR | Status: AC
  Filled 2017-09-09: qty 2

## 2017-09-09 MED ORDER — ASPIRIN EC 81 MG PO TBEC
81.0000 mg | DELAYED_RELEASE_TABLET | Freq: Every day | ORAL | Status: DC
  Administered 2017-09-10: 81 mg via ORAL
  Filled 2017-09-09 (×2): qty 1

## 2017-09-09 MED ORDER — DIPHENHYDRAMINE HCL 50 MG/ML IJ SOLN
INTRAMUSCULAR | Status: AC
  Filled 2017-09-09: qty 1

## 2017-09-09 MED ORDER — SODIUM CHLORIDE 0.9 % IV SOLN
INTRAVENOUS | Status: AC | PRN
  Administered 2017-09-09: 250 mL via INTRAVENOUS

## 2017-09-09 SURGICAL SUPPLY — 11 items
BALLN ~~LOC~~ TREK RX 3.25X12 (BALLOONS) ×3
BALLOON ~~LOC~~ TREK RX 3.25X12 (BALLOONS) ×1 IMPLANT
CATH EAGLE EYE PLAT IMAGING (CATHETERS) ×3 IMPLANT
CATH VISTA GUIDE 6FR XBLAD3.5 (CATHETERS) ×3 IMPLANT
DEVICE INFLAT 30 PLUS (MISCELLANEOUS) ×3 IMPLANT
GLIDESHEATH SLEND SS 6F .021 (SHEATH) ×3 IMPLANT
KIT MANI 3VAL PERCEP (MISCELLANEOUS) ×3 IMPLANT
PACK CARDIAC CATH (CUSTOM PROCEDURE TRAY) ×3 IMPLANT
STENT SIERRA 3.00 X 38 MM (Permanent Stent) ×3 IMPLANT
WIRE ROSEN-J .035X260CM (WIRE) ×3 IMPLANT
WIRE RUNTHROUGH .014X180CM (WIRE) ×3 IMPLANT

## 2017-09-09 NOTE — Progress Notes (Signed)
Received from Cath.Lab Recovery at 1300 today. Alert and oriented .Denies pain. TR arm band on right radial artery deflated per protocol and removed at 1800. No bruising or bleeding noted on right cath.site. Tegaderm placed over right radial cath. site.

## 2017-09-09 NOTE — Consult Note (Signed)
Pt underwent semi-elective LHC for USAP with successful DES placement. Hemodynamically stable and comfortable on RA. No formal consultation performed. Likely DC to home 02/19. PCCM is available as needed.   Merton Border, MD PCCM service Mobile [phone] Pager [phone] 09/09/2017 3:14 PM

## 2017-09-09 NOTE — Interval H&P Note (Signed)
Cath Lab Visit (complete for each Cath Lab visit)  Clinical Evaluation Leading to the Procedure:   ACS: Yes.   (unstable angina post RCA PCI with severe LAD disease)  Non-ACS:   n/a      History and Physical Interval Note:  09/09/2017 9:40 AM  [facility]  has presented today for surgery, with the diagnosis of Coronary angioplasty   Atherosclerosis  The various methods of treatment have been discussed with the patient and family. After consideration of risks, benefits and other options for treatment, the patient has consented to  Procedure(s): CORONARY STENT INTERVENTION (N/A) as a surgical intervention .  The patient's history has been reviewed, patient examined, no change in status, stable for surgery.  I have reviewed the patient's chart and labs.  Questions were answered to the patient's satisfaction.     Kathlyn Sacramento

## 2017-09-10 ENCOUNTER — Telehealth: Payer: Self-pay

## 2017-09-10 ENCOUNTER — Telehealth: Payer: Self-pay | Admitting: Cardiovascular Disease

## 2017-09-10 DIAGNOSIS — I208 Other forms of angina pectoris: Secondary | ICD-10-CM

## 2017-09-10 DIAGNOSIS — I2510 Atherosclerotic heart disease of native coronary artery without angina pectoris: Secondary | ICD-10-CM | POA: Diagnosis not present

## 2017-09-10 LAB — BASIC METABOLIC PANEL
ANION GAP: 9 (ref 5–15)
BUN: 14 mg/dL (ref 6–20)
CALCIUM: 8.6 mg/dL — AB (ref 8.9–10.3)
CO2: 22 mmol/L (ref 22–32)
Chloride: 108 mmol/L (ref 101–111)
Creatinine, Ser: 0.95 mg/dL (ref 0.61–1.24)
GLUCOSE: 124 mg/dL — AB (ref 65–99)
Potassium: 3.6 mmol/L (ref 3.5–5.1)
SODIUM: 139 mmol/L (ref 135–145)

## 2017-09-10 LAB — CBC
HCT: 38.5 % — ABNORMAL LOW (ref 40.0–52.0)
HEMOGLOBIN: 12.9 g/dL — AB (ref 13.0–18.0)
MCH: 27.9 pg (ref 26.0–34.0)
MCHC: 33.4 g/dL (ref 32.0–36.0)
MCV: 83.4 fL (ref 80.0–100.0)
Platelets: 273 10*3/uL (ref 150–440)
RBC: 4.62 MIL/uL (ref 4.40–5.90)
RDW: 14.9 % — ABNORMAL HIGH (ref 11.5–14.5)
WBC: 14.4 10*3/uL — ABNORMAL HIGH (ref 3.8–10.6)

## 2017-09-10 MED ORDER — FLUTICASONE PROPIONATE 50 MCG/ACT NA SUSP: 2.0000 | Freq: Every day | NASAL | Status: DC

## 2017-09-10 MED ORDER — PNEUMOCOCCAL VAC POLYVALENT 25 MCG/0.5ML IJ INJ: 0.5000 mL | INJECTION | INTRAMUSCULAR | Status: DC

## 2017-09-10 MED ORDER — FLUTICASONE PROPIONATE 50 MCG/ACT NA SUSP
2.0000 | Freq: Every day | NASAL | Status: DC
  Administered 2017-09-10: 2 via NASAL
  Filled 2017-09-10: qty 16

## 2017-09-10 MED ORDER — LORATADINE 10 MG PO TABS
10.0000 mg | ORAL_TABLET | Freq: Every day | ORAL | Status: DC
  Administered 2017-09-10: 10 mg via ORAL
  Filled 2017-09-10: qty 1

## 2017-09-10 NOTE — Discharge Summary (Signed)
Discharge Summary    Patient ID: Todd Weaver  MRN: [id number], DOB/AGE: 02-02-1951 67 y.o.  Admit Date: 09/09/2017 Discharge Date: 09/10/2017  Primary Care Provider: Adline Potter, MD Primary Cardiologist: Dr. Fletcher Anon, MD  Discharge Diagnoses    Active Problems:   Coronary artery disease   Effort angina Todd Weaver)   Contraindication to percutaneous coronary intervention (PCI)   Allergies Allergies  Allergen Reactions  . Codeine Hives  . Shellfish Allergy Hives and Swelling  . Contrast Media [Iodinated Diagnostic Agents] Itching and Other (See Comments)    Whelps on tongue     History of Present Illness     67 y.o. male with history of recent inferior ST elevation MI on 07/18/2017 s/p PCI/DES x 2 to the RCA with residual disease involving the distal left main/ostial LAD, proximal LAD, mid LAD, and LCx as detailed below, ICM/chronic systolic CHF, PAF, "mild strokes" x 2 ~ 8-10 years prior, HLD, prior tobacco abuse, ED, back pain, BPH, and mild cognitive impairment who presented to [name] on 09/09/2017 for planned outpatient diagnostic cath.   Patient was previously seen by Dr. Yvone Weaver for hypertension and bradycardia. He was initially seen in 02/2016 for evaluation of fatigue, weakness, and SOB. Heart rate noted to be 49 at initial visit on atenolol. Beta blocker was reduced. He underwent nuclear stress testing in 03/2016 that showed a small defect of mild severity in the apex location, partially reversible and suspected to be due to artifact given intense GI uptake, EF 55-65%. Overall, this was a low risk study. Echo in 03/2016 showed an EF of 55-60%, normal wall motion, normal LV diastolic function, no significant valvular abnormalities. He was seen on 09/25/2016 for follow up and was doing well at that time, improved off beta blocker. He was admitted to [name] on 07/18/2017 with an acute inferior ST elevation MI complicated by VF x 2 requiring brief episode of CPR and defibrillation x 2. He  underwent successful PCI/DES x 2 to the RCA with residual 85% stenosed distal left main/ostial LAD, proximal LAD 60% stenosed, mid LAD 40% stenosed, proximal LCx 40% stenosed, post atrio 60% stenosed. Case was complicated by hypotension throughout the PCI likely due to RV involvement that responded to IV fluids and norepinephrine gtt. Troponin peaked at 44.68 Post-procedure, in the ICU, he developed new onset Afib with RVR that was successfully treated with IV amiodarone, leading to conversion to sinus rhythm. He was not placed on triple therapy given this was a brief episode of Afib. Post-procedure echo showed an EF of 45-50%, moderate hypokinesis of the inferior and inferoseptal myocardium. He was gently diuresed with improvement in breathing. He was discharged on DAPT with ASA and Brilinta along with amiodarone, Lipitor, losartan, magnesium oxide, Lopressor, SL NTG, along with his noncardiac medications. Discharge weight of 187 pounds. He returned to [name] 12/31 with 30-45 minutes of SOB with left hand parasthesias. Symptoms did not feel like prior MI. Troponin was noted to still be down trending from prior, recent admission. EKG was not acute. He was asymptomatic in the ED. ED weight of 187 pounds. It was felt his SOB may have been mild pulmonary edema and it was recommended he take Lasix 20 mg daily with KCl 10 mEq daily. He has started to work with cardiac rehab. He was seen in the office on 08/15/2017 for Weaver follow up and noted exertional chest pain and SOB. He was compliant with all medications. He underwent repeat LHC on 08/19/2017 that showed significant two-vessel CAD  with widely patient stents in the RCA without any restenosis. Significant ostial LAD stenosis was unchanged with moderate disease in the proximal and mid LAD segment. Case was discussed at conference with recommendation to proceed with IVUS-guided intervention of the LAD.   Weaver Course     Consultants: none   He presented to  [facility] on 09/09/2017 for planned outpatient diagnostic LHC and underwent successful IVUS-guided PCI/DES to the ostial and proximal LAD as detailed below. Post-procedure was uneventful. He did have some chest discomfort overnight, though not similar to his prior MI. Post cath labs are stable with an mild leukocytosis without evidence for infection. He has ambulated without issues.   The patient's right radial cath site has been examined and is healing well without issues at this time. The patient has been seen by Dr. Saunders Weaver and felt to be stable for discharge today. All follow up appointments have been made. Discharge medications are listed below. Prescriptions have been reviewed with the patient and sent in to their pharmacy.  _____________  Discharge Vitals Blood pressure 133/75, pulse (!) 57, temperature 97.8 F (36.6 C), resp. rate 14, height '5\' 7"'  (1.702 m), weight 183 lb (83 kg), SpO2 95 %.  Filed Weights   09/09/17 0833  Weight: 183 lb (83 kg)    Labs & Radiologic Studies    CBC Recent Labs    09/10/17 0423  WBC 14.4*  HGB 12.9*  HCT 38.5*  MCV 83.4  PLT 169   Basic Metabolic Panel Recent Labs    09/10/17 0423  NA 139  K 3.6  CL 108  CO2 22  GLUCOSE 124*  BUN 14  CREATININE 0.95  CALCIUM 8.6*   Liver Function Tests No results for input(s): AST, ALT, ALKPHOS, BILITOT, PROT, ALBUMIN in the last 72 hours. No results for input(s): LIPASE, AMYLASE in the last 72 hours. Cardiac Enzymes No results for input(s): CKTOTAL, CKMB, CKMBINDEX, TROPONINI in the last 72 hours. BNP Invalid input(s): POCBNP D-Dimer No results for input(s): DDIMER in the last 72 hours. Hemoglobin A1C No results for input(s): HGBA1C in the last 72 hours. Fasting Lipid Panel No results for input(s): CHOL, HDL, LDLCALC, TRIG, CHOLHDL, LDLDIRECT in the last 72 hours. Thyroid Function Tests No results for input(s): TSH, T4TOTAL, T3FREE, THYROIDAB in the last 72 hours.  Invalid input(s):  FREET3 _____________  No results found.  Diagnostic Studies/Procedures   LHC 09/09/2017: Coronary Findings   Diagnostic  Dominance: Right  Left Main  The vessel exhibits minimal luminal irregularities.  Dist LM to Ost LAD lesion 85% stenosed  Dist LM to Ost LAD lesion is 85% stenosed.  Left Anterior Descending  Prox LAD lesion 60% stenosed  Prox LAD lesion is 60% stenosed.  Mid LAD lesion 30% stenosed  Mid LAD lesion is 30% stenosed.  First Diagonal Branch  There is mild disease in the vessel.  Second Diagonal Branch  There is mild disease in the vessel.  Third Diagonal Branch  There is mild disease in the vessel.  Ramus Intermedius  Ost Ramus lesion 50% stenosed  Ost Ramus lesion is 50% stenosed.  Left Circumflex  Prox Cx lesion 40% stenosed  Prox Cx lesion is 40% stenosed.  First Obtuse Marginal Branch  There is mild disease in the vessel.  Second Obtuse Marginal Branch  There is mild disease in the vessel.  Third Obtuse Marginal Branch  There is mild disease in the vessel.  Right Coronary Artery  Ost RCA to Mid RCA lesion 0% stenosed  Non-stenotic Ost RCA to Mid RCA lesion previously treated. Vessel is the culprit lesion. The lesion is type C and thrombotic.  Right Posterior Descending Artery  There is mild disease in the vessel.  Right Posterior Atrioventricular Branch  There is mild disease in the vessel.  Intervention   Dist LM to Ost LAD lesion  Stent (Also treats lesions: Prox LAD)  Lesion length: 30 mm. Lesion crossed with guidewire using a WIRE RUNTHROUGH .[id number]. Pre-stent angioplasty was not performed. A drug-eluting stent was successfully placed using a STENT SIERRA 3.00 X 38 MM. Maximum pressure: 12 atm. Inflation time: 20 sec. Post-stent angioplasty was performed using a BALLOON [place] [id number]. Maximum pressure: 16 atm. Inflation time: 60 sec. Intravascular ultrasound was performed before PCI which showed that the mid LAD had a diameter of 3.0 mm.  The proximal LAD lesion minimal luminal area was 3.71m. Proximal to that the vessel diameter was 3.2 mm. The ostial stenosis minimal luminal area was 3.1 mm. Based on this, I used a 3 mm balloon which was postdilated with a 3.25 mm noncompliant balloon to high pressure. The ostium of the ramus branch was pinched by the stent with worsening of stenosis to about 70-80% but with normal flow. The vessel diameter is less than 2 mm.  Post-Intervention Lesion Assessment  The intervention was successful. Pre-interventional TIMI flow is 3. Post-intervention TIMI flow is 3.  There is no residual stenosis post intervention.  Prox LAD lesion  Stent (Also treats lesions: Dist LM to Ost LAD)  Lesion length: 30 mm. Lesion crossed with guidewire using a WIRE RUNTHROUGH .[id number] Pre-stent angioplasty was not performed. A drug-eluting stent was successfully placed using a STENT SIERRA 3.00 X 38 MM. Maximum pressure: 12 atm. Inflation time: 20 sec. Post-stent angioplasty was performed using a BALLOON [id number] Maximum pressure: 16 atm. Inflation time: 60 sec. Intravascular ultrasound was performed before PCI which showed that the mid LAD had a diameter of 3.0 mm. The proximal LAD lesion minimal luminal area was 3.127m Proximal to that the vessel diameter was 3.2 mm. The ostial stenosis minimal luminal area was 3.1 mm. Based on this, I used a 3 mm balloon which was postdilated with a 3.25 mm noncompliant balloon to high pressure. The ostium of the ramus branch was pinched by the stent with worsening of stenosis to about 70-80% but with normal flow. The vessel diameter is less than 2 mm.  Post-Intervention Lesion Assessment  The intervention was successful. Pre-interventional TIMI flow is 3. Post-intervention TIMI flow is 3.  There is no residual stenosis post intervention.  Coronary Diagrams   Diagnostic Diagram       Post-Intervention Diagram        Conclusion     Dist LM to Ost LAD lesion is  85% stenosed.  Prox LAD lesion is 60% stenosed.  Mid LAD lesion is 30% stenosed.  Prox Cx lesion is 40% stenosed.  Non-stenotic Ost RCA to Mid RCA lesion previously treated.  Ost Ramus lesion is 50% stenosed.  Post intervention, there is a 0% residual stenosis.  Post intervention, there is a 0% residual stenosis.  A drug-eluting stent was successfully placed using a STENT SIERRA 3.00 X 38 MM.   Successful IVUS guided drug-eluting stent placement to the proximal and ostial LAD with one long stent.  Recommendations: Dual antiplatelet therapy for at least one year.  Aggressive treatment of risk factors.    _____________  Disposition   Pt is being discharged home  today in good condition.  Follow-up Plans & Appointments    Follow-up Information    Rise Mu, PA-C Follow up on 09/24/2017.   Specialties:  Physician Assistant, Cardiology, Radiology Why:  appointment time 1:30 PM Contact information: [address] [place] [address] [phone]          Discharge Instructions    AMB Referral to Cardiac Rehabilitation - Phase II   Complete by:  As directed    Diagnosis:  Coronary Stents   Call MD for:  difficulty breathing, headache or visual disturbances   Complete by:  As directed    Call MD for:  extreme fatigue   Complete by:  As directed    Call MD for:  hives   Complete by:  As directed    Call MD for:  persistant dizziness or light-headedness   Complete by:  As directed    Call MD for:  persistant nausea and vomiting   Complete by:  As directed    Call MD for:  redness, tenderness, or signs of infection (pain, swelling, redness, odor or green/yellow discharge around incision site)   Complete by:  As directed    Call MD for:  severe uncontrolled pain   Complete by:  As directed    Call MD for:  temperature >100.4   Complete by:  As directed    Diet - low sodium heart healthy   Complete by:  As directed    Increase activity slowly    Complete by:  As directed       Discharge Medications   Allergies as of 09/10/2017      Reactions   Codeine Hives   Shellfish Allergy Hives, Swelling   Contrast Media [iodinated Diagnostic Agents] Itching, Other (See Comments)   Whelps on tongue      Medication List    TAKE these medications   aspirin EC 81 MG tablet Take 81 mg by mouth daily.   atorvastatin 80 MG tablet Commonly known as:  LIPITOR Take 1 tablet (80 mg total) by mouth daily at 6 PM.   furosemide 20 MG tablet Commonly known as:  LASIX Take 1 tablet (20 mg total) by mouth daily.   losartan 25 MG tablet Commonly known as:  COZAAR Take 0.5 tablets (12.5 mg total) by mouth daily.   magnesium oxide 400 (241.3 Mg) MG tablet Commonly known as:  MAG-OX Take 1 tablet (400 mg total) by mouth daily.   metoprolol tartrate 25 MG tablet Commonly known as:  LOPRESSOR Take 0.5 tablets (12.5 mg total) by mouth 2 (two) times daily.   nitroGLYCERIN 0.4 MG SL tablet Commonly known as:  NITROSTAT Place 1 tablet (0.4 mg total) under the tongue every 5 (five) minutes as needed for chest pain.   potassium chloride 10 MEQ tablet Commonly known as:  K-DUR,KLOR-CON Take 1 tablet (10 mEq total) by mouth daily.   tamsulosin 0.4 MG Caps capsule Commonly known as:  FLOMAX Take 0.4 mg by mouth daily.   ticagrelor 90 MG Tabs tablet Commonly known as:  BRILINTA Take 1 tablet (90 mg total) by mouth 2 (two) times daily.         Aspirin prescribed at discharge?  Yes High Intensity Statin Prescribed? (Lipitor 40-41m or Crestor 20-431m: Yes Beta Blocker Prescribed? Yes For EF <40%, was ACEI/ARB Prescribed? No: EF > 40% ADP Receptor Inhibitor Prescribed? (i.e. Plavix etc.-Includes Medically Managed Patients): Yes For EF <40%, Aldosterone Inhibitor Prescribed? No: Ef > 40%  Was EF assessed during THIS hospitalization? Yes Was Cardiac Rehab II ordered? (Included Medically managed Patients): Yes   Outstanding Labs/Studies    Needs follow up CBC, lipid and liver function  Duration of Discharge Encounter   Greater than 30 minutes including physician time.  Signed, Rise Mu, PA-C [facility] Pager: [phone] 09/10/2017, 8:49 AM

## 2017-09-10 NOTE — Discharge Instructions (Signed)
Acute Pain, Adult Acute pain is a type of pain that may last for just a few days or as long as six months. It is often related to an illness, injury, or medical procedure. Acute pain may be mild, moderate, or severe. It usually goes away once your injury has healed or you are no longer ill. Pain can make it hard for you to do daily activities. It can cause anxiety and lead to other problems if left untreated. Treatment depends on the cause and severity of your acute pain. Follow these instructions at home:  Check your pain level as told by your health care provider.  Take over-the-counter and prescription medicines only as told by your health care provider.  If you are taking prescription pain medicine: ? Ask your health care provider about taking a stool softener or laxative to prevent constipation. ? Do not stop taking the medicine suddenly. Talk to your health care provider about how and when to discontinue prescription pain medicine. ? If your pain is severe, do not take more pills than instructed by your health care provider. ? Do not take other over-the-counter pain medicines in addition to this medicine unless told by your health care provider. ? Do not drive or operate heavy machinery while taking prescription pain medicine.  Apply ice or heat as told by your health care provider. These may reduce swelling and pain.  Ask your health care provider if other strategies such as distraction, relaxation, or physical therapies can help your pain.  Keep all follow-up visits as told by your health care provider. This is important. Contact a health care provider if:  You have pain that is not controlled by medicine.  Your pain does not improve or gets worse.  You have side effects from pain medicines, such as vomitingor confusion. Get help right away if:  You have severe pain.  You have trouble breathing.  You lose consciousness.  You have chest pain or pressure that lasts for more  than a few minutes. Along with the chest pain you may: ? Have pain or discomfort in one or both arms, your back, neck, jaw, or stomach. ? Have shortness of breath. ? Break out in a cold sweat. ? Feel nauseous. ? Become light-headed. These symptoms may represent a serious problem that is an emergency. Do not wait to see if the symptoms will go away. Get medical help right away. Call your local emergency services (911 in the [place] Do not drive yourself to the hospital. This information is not intended to replace advice given to you by your health care provider. Make sure you discuss any questions you have with your health care provider. Document Released: 07/24/2015 Document Revised: 12/16/2015 Document Reviewed: 07/24/2015 Elsevier Interactive Patient Education  2018 [place].   Angina Pectoris Angina pectoris, often called angina, is extreme discomfort in the chest, neck, or arm. This is caused by a lack of blood in the middle and thickest layer of the heart wall (myocardium). There are four types of angina:  Stable angina. Stable angina usually occurs in episodes of predictable frequency and duration. It is usually brought on by physical activity, stress, or excitement. Stable angina usually lasts a few minutes and can often be relieved by a medicine that you place under your tongue. This medicine is called sublingual nitroglycerin.  Unstable angina. Unstable angina can occur even when you are doing little or no physical activity. It can even occur while you are sleeping or when you are  at rest. It can suddenly increase in severity or frequency. It may not be relieved by sublingual nitroglycerin, and it can last up to 30 minutes.  Microvascular angina. This type of angina is caused by a disorder of tiny blood vessels called arterioles. Microvascular angina is more common in women. The pain may be more severe and last longer than other types of angina pectoris.  Prinzmetal or variant  angina. This type of angina pectoris is rare and usually occurs when you are doing little or no physical activity. It especially occurs in the early morning hours.  What are the causes? Atherosclerosis is the cause of angina. This is the buildup of fat and cholesterol (plaque) on the inside of the arteries. Over time, the plaque may narrow or block the artery, and this will lessen blood flow to the heart. Plaque can also become weak and break off within a coronary artery to form a clot and cause a sudden blockage. What increases the risk? Risk factors common to both men and women include:  High cholesterol levels.  High blood pressure (hypertension).  Tobacco use.  Diabetes.  Family history of angina.  Obesity.  Lack of exercise.  A diet high in saturated fats.  Women are at greater risk for angina if they are:  Over age 32.  Postmenopausal.  What are the signs or symptoms? Many people do not experience any symptoms during the early stages of angina. As the condition progresses, symptoms common to both men and women may include:  Chest pain. ? The pain can be described as a crushing or squeezing in the chest, or a tightness, pressure, fullness, or heaviness in the chest. ? The pain can last more than a few minutes, or it can stop and recur.  Pain in the arms, neck, jaw, or back.  Unexplained heartburn or indigestion.  Shortness of breath.  Nausea.  Sudden cold sweats.  Sudden light-headedness.  Many women have chest discomfort and some of the other symptoms. However, women often have different (atypical) symptoms, such as:  Fatigue.  Unexplained feelings of nervousness or anxiety.  Unexplained weakness.  Dizziness or fainting.  Sometimes, women may have angina without any symptoms. How is this diagnosed? Tests to diagnose angina may include:  ECG (electrocardiogram).  Exercise stress test. This looks for signs of blockage when the heart is being  exercised.  Pharmacologic stress test. This test looks for signs of blockage when the heart is being stressed with a medicine.  Blood tests.  Coronary angiogram. This is a procedure to look at the coronary arteries to see if there is any blockage.  How is this treated? The treatment of angina may include the following:  Healthy behavioral changes to reduce or control risk factors.  Medicine.  Coronary stenting.A stent helps to keep an artery open.  Coronary angioplasty. This procedure widens a narrowed or blocked artery.  Coronary arterybypass surgery. This will allow your blood to pass the blockage (bypass) to reach your heart.  Follow these instructions at home:  Take medicines only as directed by your health care provider.  Do not take the following medicines unless your health care provider approves: ? Nonsteroidal anti-inflammatory drugs (NSAIDs), such as ibuprofen, naproxen, or celecoxib. ? Vitamin supplements that contain vitamin A, vitamin E, or both. ? Hormone replacement therapy that contains estrogen with or without progestin.  Manage other health conditions such as hypertension and diabetes as directed by your health care provider.  Follow a heart-healthy diet. A dietitian  can help to educate you about healthy food options and changes.  Use healthy cooking methods such as roasting, grilling, broiling, baking, poaching, steaming, or stir-frying. Talk to a dietitian to learn more about healthy cooking methods.  Follow an exercise program approved by your health care provider.  Maintain a healthy weight. Lose weight as approved by your health care provider.  Plan rest periods when fatigued.  Learn to manage stress.  Do not use any tobacco products, including cigarettes, chewing tobacco, or electronic cigarettes. If you need help quitting, ask your health care provider.  If you drink alcohol, and your health care provider approves, limit your alcohol intake to  no more than 1 drink per day. One drink equals 12 ounces of beer, 5 ounces of wine, or 1 ounces of hard liquor.  Stop illegal drug use.  Keep all follow-up visits as directed by your health care provider. This is important. Get help right away if:  You have pain in your chest, neck, arm, jaw, stomach, or back that lasts more than a few minutes, is recurring, or is unrelieved by taking sublingualnitroglycerin.  You have profuse sweating without cause.  You have unexplained: ? Heartburn or indigestion. ? Shortness of breath or difficulty breathing. ? Nausea or vomiting. ? Fatigue. ? Feelings of nervousness or anxiety. ? Weakness. ? Diarrhea.  You have sudden light-headedness or dizziness.  You faint. These symptoms may represent a serious problem that is an emergency. Do not wait to see if the symptoms will go away. Get medical help right away. Call your local emergency services (911 in the [place] Do not drive yourself to the hospital. This information is not intended to replace advice given to you by your health care provider. Make sure you discuss any questions you have with your health care provider. Document Released: 07/09/2005 Document Revised: 12/21/2015 Document Reviewed: 11/10/2013 Elsevier Interactive Patient Education  2017 [place] An angiogram, also called angiography, is a procedure used to look at the blood vessels. In this procedure, dye is injected through a long, thin tube (catheter) into an artery. X-rays are then taken. The X-rays will show if there is a blockage or problem in a blood vessel. Tell a health care provider about:  Any allergies you have, including allergies to shellfish or contrast dye.  All medicines you are taking, including vitamins, herbs, eye drops, creams, and over-the-counter medicines.  Any problems you or family members have had with anesthetic medicines.  Any blood disorders you have.  Any surgeries you have  had.  Any previous kidney problems or failure you have had.  Any medical conditions you have.  Possibility of pregnancy, if this applies. What are the risks? Generally, an angiogram is a safe procedure. However, as with any procedure, problems can occur. Possible problems include:  Injury to the blood vessels, including rupture or bleeding.  Infection or bruising at the catheter site.  Allergic reaction to the dye or contrast used.  Kidney damage from the dye or contrast used.  Blood clots that can lead to a stroke or heart attack.  What happens before the procedure?  Do not eat or drink after midnight on the night before the procedure, or as directed by your health care provider.  Ask your health care provider if you may drink enough water to take any needed medicines the morning of the procedure. What happens during the procedure?  You may be given a medicine to help you relax (sedative) before and  during the procedure. This medicine is given through an IV access tube that is inserted into one of your veins.  The area where the catheter will be inserted will be washed and shaved. This is usually done in the groin but may be done in the fold of your arm (near your elbow) or in the wrist.  A medicine will be given to numb the area where the catheter will be inserted (local anesthetic).  The catheter will be inserted with a guide wire into an artery. The catheter is guided by using a type of X-ray (fluoroscopy) to the blood vessel being examined.  Dye is then injected into the catheter, and X-rays are taken. The dye helps to show where any narrowing or blockages are located. What happens after the procedure?  If the procedure is done through the leg, you will be kept in bed lying flat for several hours. You will be instructed to not bend or cross your legs.  The insertion site will be checked frequently.  The pulse in your feet or wrist will be checked  frequently.  Additional blood tests, X-rays, and electrocardiography may be done.  You may need to stay in the hospital overnight for observation. This information is not intended to replace advice given to you by your health care provider. Make sure you discuss any questions you have with your health care provider. Document Released: 04/18/2005 Document Revised: 12/21/2015 Document Reviewed: 12/10/2012 Elsevier Interactive Patient Education  2017 [place] An angiogram, also called angiography, is a procedure used to look at the blood vessels. In this procedure, dye is injected through a long, thin tube (catheter) into an artery. X-rays are then taken. The X-rays will show if there is a blockage or problem in a blood vessel. Tell a health care provider about:  Any allergies you have, including allergies to shellfish or contrast dye.  All medicines you are taking, including vitamins, herbs, eye drops, creams, and over-the-counter medicines.  Any problems you or family members have had with anesthetic medicines.  Any blood disorders you have.  Any surgeries you have had.  Any previous kidney problems or failure you have had.  Any medical conditions you have.  Possibility of pregnancy, if this applies. What are the risks? Generally, an angiogram is a safe procedure. However, as with any procedure, problems can occur. Possible problems include:  Injury to the blood vessels, including rupture or bleeding.  Infection or bruising at the catheter site.  Allergic reaction to the dye or contrast used.  Kidney damage from the dye or contrast used.  Blood clots that can lead to a stroke or heart attack.  What happens before the procedure?  Do not eat or drink after midnight on the night before the procedure, or as directed by your health care provider.  Ask your health care provider if you may drink enough water to take any needed medicines the morning of the  procedure. What happens during the procedure?  You may be given a medicine to help you relax (sedative) before and during the procedure. This medicine is given through an IV access tube that is inserted into one of your veins.  The area where the catheter will be inserted will be washed and shaved. This is usually done in the groin but may be done in the fold of your arm (near your elbow) or in the wrist.  A medicine will be given to numb the area where the catheter will be  inserted (local anesthetic).  The catheter will be inserted with a guide wire into an artery. The catheter is guided by using a type of X-ray (fluoroscopy) to the blood vessel being examined.  Dye is then injected into the catheter, and X-rays are taken. The dye helps to show where any narrowing or blockages are located. What happens after the procedure?  If the procedure is done through the leg, you will be kept in bed lying flat for several hours. You will be instructed to not bend or cross your legs.  The insertion site will be checked frequently.  The pulse in your feet or wrist will be checked frequently.  Additional blood tests, X-rays, and electrocardiography may be done.  You may need to stay in the hospital overnight for observation. This information is not intended to replace advice given to you by your health care provider. Make sure you discuss any questions you have with your health care provider. Document Released: 04/18/2005 Document Revised: 12/21/2015 Document Reviewed: 12/10/2012 Elsevier Interactive Patient Education  2017 [employer].

## 2017-09-10 NOTE — Telephone Encounter (Signed)
Spoke with pt - he says Dr Fletcher Anon cleared him to return Thursday - I will confirm clearance

## 2017-09-10 NOTE — Care Management (Signed)
[facility] voucher provided to patient. No other RNCM needs.

## 2017-09-10 NOTE — Progress Notes (Signed)
1100 D/C home with wife. Discharge teaching done . Questions answered.

## 2017-09-10 NOTE — Telephone Encounter (Signed)
TCM....  Patient is being discharged later today   They saw Thurmond Butts in hospital   They are scheduled to see Thurmond Butts  on 09/24/17   They were seen for Cath Fu  They need to be seen within 2 weeks   Pt is not on the wait list   Please call

## 2017-09-10 NOTE — Progress Notes (Signed)
Progress Note  Patient Name: Todd Weaver Date of Encounter: 09/10/2017  Primary Cardiologist: Fletcher Anon  Subjective   Outpatient diagnostic [facility] 09/09/2017 with successful IVUS-guided PCI/DES of the ostial to proximal LAD as detailed below. Had some chest pain overnight that was not similar to his prior pain. Currently, symptom free. No SOB. Tolerating medications without issues. Ambulated without issues. Wants to go home. BP stable.   Inpatient Medications    Scheduled Meds: . aspirin EC  81 mg Oral Daily  . atorvastatin  80 mg Oral q1800  . fluticasone  2 spray Each Nare Daily  . furosemide  20 mg Oral Daily  . loratadine  10 mg Oral Daily  . losartan  12.5 mg Oral Daily  . metoprolol tartrate  12.5 mg Oral BID  . potassium chloride  10 mEq Oral Daily  . sodium chloride flush  3 mL Intravenous Q12H  . tamsulosin  0.4 mg Oral Daily  . ticagrelor  90 mg Oral BID   Continuous Infusions: . sodium chloride     PRN Meds: sodium chloride, acetaminophen, nitroGLYCERIN, ondansetron (ZOFRAN) IV, sodium chloride flush   Vital Signs    Vitals:   09/10/17 0500 09/10/17 0600 09/10/17 0700 09/10/17 0800  BP: 126/79 (!) 142/85 116/75 133/75  Pulse: (!) 56 61 (!) 52 (!) 57  Resp: '13 12 12 14  '$ Temp:    97.8 F (36.6 C)  TempSrc:      SpO2: 97% 97% 96% 95%  Weight:      Height:        Intake/Output Summary (Last 24 hours) at 09/10/2017 0831 Last data filed at 09/10/2017 0500 Gross per 24 hour  Intake 815 ml  Output 1325 ml  Net -510 ml   Filed Weights   09/09/17 0833  Weight: 183 lb (83 kg)    Telemetry    NSR - Personally Reviewed  ECG    n/a - Personally Reviewed  Physical Exam   GEN: No acute distress.   Neck: No JVD. Cardiac: RRR, no murmurs, rubs, or gallops. Right radial cath site without bleeding, bruising, swelling, erythema, warmth, or TTP. Radial pulse 2+.  Respiratory: Clear to auscultation bilaterally.  GI: Soft, nontender, non-distended.   MS: No  edema; No deformity. Neuro:  Alert and oriented x 3; Nonfocal.  Psych: Normal affect.  Labs    Chemistry Recent Labs  Lab 09/10/17 0423  NA 139  K 3.6  CL 108  CO2 22  GLUCOSE 124*  BUN 14  CREATININE 0.95  CALCIUM 8.6*  GFRNONAA >60  GFRAA >60  ANIONGAP 9     Hematology Recent Labs  Lab 09/10/17 0423  WBC 14.4*  RBC 4.62  HGB 12.9*  HCT 38.5*  MCV 83.4  MCH 27.9  MCHC 33.4  RDW 14.9*  PLT 273    Cardiac EnzymesNo results for input(s): TROPONINI in the last 168 hours. No results for input(s): TROPIPOC in the last 168 hours.   BNPNo results for input(s): BNP, PROBNP in the last 168 hours.   DDimer No results for input(s): DDIMER in the last 168 hours.   Radiology    No results found.  Cardiac Studies   LHC 09/09/2017: Coronary Findings   Diagnostic  Dominance: Right  Left Main  The vessel exhibits minimal luminal irregularities.  Dist LM to Ost LAD lesion 85% stenosed  Dist LM to Ost LAD lesion is 85% stenosed.  Left Anterior Descending  Prox LAD lesion 60% stenosed  Prox  LAD lesion is 60% stenosed.  Mid LAD lesion 30% stenosed  Mid LAD lesion is 30% stenosed.  First Diagonal Branch  There is mild disease in the vessel.  Second Diagonal Branch  There is mild disease in the vessel.  Third Diagonal Branch  There is mild disease in the vessel.  Ramus Intermedius  Ost Ramus lesion 50% stenosed  Ost Ramus lesion is 50% stenosed.  Left Circumflex  Prox Cx lesion 40% stenosed  Prox Cx lesion is 40% stenosed.  First Obtuse Marginal Branch  There is mild disease in the vessel.  Second Obtuse Marginal Branch  There is mild disease in the vessel.  Third Obtuse Marginal Branch  There is mild disease in the vessel.  Right Coronary Artery  Ost RCA to Mid RCA lesion 0% stenosed  Non-stenotic Ost RCA to Mid RCA lesion previously treated. Vessel is the culprit lesion. The lesion is type C and thrombotic.  Right Posterior Descending Artery  There is  mild disease in the vessel.  Right Posterior Atrioventricular Branch  There is mild disease in the vessel.  Intervention   Dist LM to Ost LAD lesion  Stent (Also treats lesions: Prox LAD)  Lesion length: 30 mm. Lesion crossed with guidewire using a WIRE RUNTHROUGH .[id number]. Pre-stent angioplasty was not performed. A drug-eluting stent was successfully placed using a STENT SIERRA 3.00 X 38 MM. Maximum pressure: 12 atm. Inflation time: 20 sec. Post-stent angioplasty was performed using a BALLOON [place] [id number]. Maximum pressure: 16 atm. Inflation time: 60 sec. Intravascular ultrasound was performed before PCI which showed that the mid LAD had a diameter of 3.0 mm. The proximal LAD lesion minimal luminal area was 3.35m. Proximal to that the vessel diameter was 3.2 mm. The ostial stenosis minimal luminal area was 3.1 mm. Based on this, I used a 3 mm balloon which was postdilated with a 3.25 mm noncompliant balloon to high pressure. The ostium of the ramus branch was pinched by the stent with worsening of stenosis to about 70-80% but with normal flow. The vessel diameter is less than 2 mm.  Post-Intervention Lesion Assessment  The intervention was successful. Pre-interventional TIMI flow is 3. Post-intervention TIMI flow is 3.  There is no residual stenosis post intervention.  Prox LAD lesion  Stent (Also treats lesions: Dist LM to Ost LAD)  Lesion length: 30 mm. Lesion crossed with guidewire using a WIRE RUNTHROUGH .[id number] Pre-stent angioplasty was not performed. A drug-eluting stent was successfully placed using a STENT SIERRA 3.00 X 38 MM. Maximum pressure: 12 atm. Inflation time: 20 sec. Post-stent angioplasty was performed using a BALLOON [id number] Maximum pressure: 16 atm. Inflation time: 60 sec. Intravascular ultrasound was performed before PCI which showed that the mid LAD had a diameter of 3.0 mm. The proximal LAD lesion minimal luminal area was 3.122m Proximal to that the  vessel diameter was 3.2 mm. The ostial stenosis minimal luminal area was 3.1 mm. Based on this, I used a 3 mm balloon which was postdilated with a 3.25 mm noncompliant balloon to high pressure. The ostium of the ramus branch was pinched by the stent with worsening of stenosis to about 70-80% but with normal flow. The vessel diameter is less than 2 mm.  Post-Intervention Lesion Assessment  The intervention was successful. Pre-interventional TIMI flow is 3. Post-intervention TIMI flow is 3.  There is no residual stenosis post intervention.  Coronary Diagrams   Diagnostic Diagram       Post-Intervention Diagram  Conclusion     Dist LM to Ost LAD lesion is 85% stenosed.  Prox LAD lesion is 60% stenosed.  Mid LAD lesion is 30% stenosed.  Prox Cx lesion is 40% stenosed.  Non-stenotic Ost RCA to Mid RCA lesion previously treated.  Ost Ramus lesion is 50% stenosed.  Post intervention, there is a 0% residual stenosis.  Post intervention, there is a 0% residual stenosis.  A drug-eluting stent was successfully placed using a STENT SIERRA 3.00 X 38 MM.   Successful IVUS guided drug-eluting stent placement to the proximal and ostial LAD with one long stent.  Recommendations: Dual antiplatelet therapy for at least one year.  Aggressive treatment of risk factors.     Patient Profile     67 y.o. male with history of recent inferior ST elevation MI on 07/18/2017 s/p PCI/DES x 2 to the RCA with residual disease involving the distal left main/ostial LAD, proximal LAD, mid LAD, and LCx as detailed below, ICM/chronic systolic CHF, PAF, "mild strokes" x 2 ~ 8-10 years prior, HLD, prior tobacco abuse, ED, back pain, BPH, and mild cognitive impairment who presented to [facility] 09/09/2017 for planned diagnostic LHC as detailed above and underwent successful IVUS-guided PCI/DES to the ostial and proximal LAD as above.   Assessment & Plan    1. CAD in native coronary artery without angina  as above: -No symptoms concerning for angina at this time -Continue DAPT with ASA and Brilinta for at least 12 months without interruption  -Post-cath instructions -Cardiac rehab -Aggressive risk factor modification   2. Chronic systolic CHF/ICM: -He does not appear volume overloaded at this time -Continue Lasix, Lopressor, losartan  -Consider addition of spironolactone as an outpatient  -Plan for follow up echo in several months -CHF education  3. PAF: -Remains in sinus rhythm -This was in the setting of his initial MI with hypotension -If he develops recurrent Afib he would need long-term, full dose anticoagulation  4. HLD: -Continue high-dose Lipitor -Needs fasting lipid and liver function at follow up  5. HTN: -Well controlled -Continue current medications  6. Leukocytosis: -Likely inflammatory -No signs of infection -Follow up with PCP  For questions or updates, please contact [place] Please consult [email or web address] for contact info under Cardiology/STEMI.    Signed, [name], PA-C [place] Pager: [phone] 09/10/2017, 8:31 AM

## 2017-09-11 ENCOUNTER — Telehealth: Payer: Self-pay

## 2017-09-11 NOTE — Telephone Encounter (Signed)
Response from Dr. Vicente Weaver: I should see within the next two weeks.   ----- Message -----  From: Aleatha Borer, LPN  Sent: 10/29/8117  3:25 PM  To: Adline Potter, MD  Subject: Appt??                      This pt was last seen by you on 07/17/17 for f/u HTN. It appears he presented to the ED on 07/22/17 for chest pain and recently underwent cardiac cath on 09/09/17. He is not scheduled to f/u with you until June. In light of his cardiac history, do you want to see him in the office for a hosp f/u? Or would you prefer to have the pt keep his appt as scheduled for June? It appears he is scheduled to f/u with cardiology on 09/24/17.   Thanks,  Todd Weaver   Transition Care Management Follow-Up Telephone Call   Date discharged and where: 09/10/17 from [facility]  How have you been since you were released from the hospital?  Underwent coronary stenting on 09/09/17. Denies fevers, chest pain, dyspnea, dizziness, s/sx of DVT. States, "I'm feeling pretty good."  Any patient concerns? Denies any questions or concerns at this time  Items Reviewed: Verified = V   Meds: V  Allergies: V  Dietary Orders: Heart healthy diet. Verbalized acceptance and understanding of dietary limitations and restrictions.  Functional Questionnaire:  Independent = I Dependent = D  ADLs: I  Dressing- I   Eating- I  Maintaining continence- I  Transferring- I  Transportation- I  Meal Prep- I  Managing Meds- I  Additional Items Reviewed:  Hospital f/u appt confirmed? Yes. Scheduled to see Dr. Vicente Weaver on 09/23/17 @ 10:15am. Denies need for transportation arrangements.  If their condition worsens, is the pt aware to call PCP or go to the Emergency Dept.? Yes. Verbalized acceptance and understanding  Was the patient provided with contact information for the PCP's office or ED? Yes. Expressed understanding and apprectiation  Was to pt encouraged to call back with questions or concerns? Y. Expressed  understanding and apprectiation

## 2017-09-11 NOTE — Telephone Encounter (Addendum)
Patient contacted regarding discharge from [facility] on 09/10/17.  Patient understands to follow up with [name], APP 09/24/17 1:30 pm at [facility]. Patient understands discharge instructions? Yes  Patient understands medications and regiment? Yes Patient understands to bring all medications to this visit? Yes  Reports doing really well since discharge.  No concerns with cath site.  Area is clean and dry, has small dressing placed by patient.  No tingling or numbness.

## 2017-09-12 DIAGNOSIS — I213 ST elevation (STEMI) myocardial infarction of unspecified site: Secondary | ICD-10-CM | POA: Diagnosis not present

## 2017-09-12 DIAGNOSIS — Z955 Presence of coronary angioplasty implant and graft: Secondary | ICD-10-CM

## 2017-09-12 DIAGNOSIS — Z48812 Encounter for surgical aftercare following surgery on the circulatory system: Secondary | ICD-10-CM | POA: Diagnosis not present

## 2017-09-12 NOTE — [place] Notes (Signed)
Daily Session Note  Patient Details  Name: Todd Weaver MRN: [id number] Date of Birth: April 20, 1951 Referring Provider:     Cardiac Rehab from 08/01/2017 in [facility] Cardiac and Pulmonary Rehab  Referring Provider  Arida      Encounter Date: 09/12/2017  Check In: Session Check In - 09/12/17 1643      Check-In   Staff Present  Renita Papa, RN Moises Blood, BS, ACSM CEP, Exercise Physiologist;Amanda Oletta Darter, [place], ACSM CEP, Exercise Physiologist;Joseph Flavia Shipper    Supervising physician immediately available to respond to emergencies  See telemetry face sheet for immediately available ER MD    Medication changes reported      No    Fall or balance concerns reported     No    Warm-up and Cool-down  Performed on first and last piece of equipment    Resistance Training Performed  Yes    VAD Patient?  No          Social History   Tobacco Use  Smoking Status Former Smoker  . Last attempt to quit: 07/24/2007  . Years since quitting: 10.1  Smokeless Tobacco Never Used    Goals Met:  Exercise tolerated well No report of cardiac concerns or symptoms Strength training completed today  Goals Unmet:  Not Applicable  Comments: Doing well with exercise prescription progression. Returned today after another stent insertion that had been planned   Dr. Emily Filbert is Medical Director for [place] and [facility].

## 2017-09-16 ENCOUNTER — Other Ambulatory Visit: Payer: Self-pay | Admitting: Family Medicine

## 2017-09-16 ENCOUNTER — Encounter: Payer: 59 | Admitting: *Deleted

## 2017-09-16 DIAGNOSIS — Z48812 Encounter for surgical aftercare following surgery on the circulatory system: Secondary | ICD-10-CM | POA: Diagnosis not present

## 2017-09-16 DIAGNOSIS — I213 ST elevation (STEMI) myocardial infarction of unspecified site: Secondary | ICD-10-CM

## 2017-09-16 DIAGNOSIS — Z955 Presence of coronary angioplasty implant and graft: Secondary | ICD-10-CM

## 2017-09-16 MED ORDER — ATORVASTATIN CALCIUM 80 MG PO TABS: 80.0000 mg | ORAL_TABLET | Freq: Every day | ORAL | 3 refills | Status: DC

## 2017-09-16 MED ORDER — TAMSULOSIN HCL 0.4 MG PO CAPS: 0.4000 mg | ORAL_CAPSULE | Freq: Every day | ORAL | 3 refills | Status: DC

## 2017-09-16 NOTE — Telephone Encounter (Signed)
FMLA paperwork given to [place]

## 2017-09-16 NOTE — [place] Notes (Signed)
Daily Session Note  Patient Details  Name: MAURIZIO GENO MRN: [id number] Date of Birth: 1951-03-19 Referring Provider:     Cardiac Rehab from 08/01/2017 in [facility] Cardiac and Pulmonary Rehab  Referring Provider  Arida      Encounter Date: 09/16/2017  Check In: Session Check In - 09/16/17 1719      Check-In   Location  [facility]-Cardiac & Pulmonary Rehab    Staff Present  Nada Maclachlan, BA, ACSM CEP, Exercise Physiologist;Marian Grandt Amedeo Plenty, BS, ACSM CEP, Exercise Physiologist;Meredith Sherryll Burger, RN BSN;Susanne Bice, RN, BSN, Florestine Avers, RN BSN    Supervising physician immediately available to respond to emergencies  See telemetry face sheet for immediately available ER MD    Medication changes reported      No    Fall or balance concerns reported     No    Warm-up and Cool-down  Performed on first and last piece of equipment    Resistance Training Performed  Yes    VAD Patient?  No      Pain Assessment   Currently in Pain?  No/denies    Multiple Pain Sites  No          Social History   Tobacco Use  Smoking Status Former Smoker  . Last attempt to quit: 07/24/2007  . Years since quitting: 10.1  Smokeless Tobacco Never Used    Goals Met:  Independence with exercise equipment Exercise tolerated well No report of cardiac concerns or symptoms Strength training completed today  Goals Unmet:  Not Applicable  Comments: Pt able to follow exercise prescription today without complaint.  Will continue to monitor for progression.    Dr. Emily Filbert is Medical Director for [place] and [facility].

## 2017-09-16 NOTE — Telephone Encounter (Signed)
Sent completed forms to [employer] via interoffice. 

## 2017-09-17 ENCOUNTER — Other Ambulatory Visit: Payer: Self-pay | Admitting: Cardiovascular Disease

## 2017-09-18 ENCOUNTER — Encounter: Payer: 59 | Admitting: *Deleted

## 2017-09-18 ENCOUNTER — Encounter: Payer: Self-pay | Admitting: *Deleted

## 2017-09-18 DIAGNOSIS — Z48812 Encounter for surgical aftercare following surgery on the circulatory system: Secondary | ICD-10-CM | POA: Diagnosis not present

## 2017-09-18 DIAGNOSIS — I213 ST elevation (STEMI) myocardial infarction of unspecified site: Secondary | ICD-10-CM

## 2017-09-18 DIAGNOSIS — Z955 Presence of coronary angioplasty implant and graft: Secondary | ICD-10-CM

## 2017-09-18 NOTE — [place] Notes (Signed)
Daily Session Note  Patient Details  Name: Todd Weaver MRN: [id number] Date of Birth: 1951-03-01 Referring Provider:     Cardiac Rehab from 08/01/2017 in [facility] Cardiac and Pulmonary Rehab  Referring Provider  Arida      Encounter Date: 09/18/2017  Check In: Session Check In - 09/18/17 1715      Check-In   Location  [facility]-Cardiac & Pulmonary Rehab    Staff Present  Renita Papa, RN Vickki Hearing, BA, ACSM CEP, Exercise Physiologist;Carroll Enterkin, RN, BSN    Supervising physician immediately available to respond to emergencies  See telemetry face sheet for immediately available ER MD    Medication changes reported      No    Fall or balance concerns reported     No    Warm-up and Cool-down  Performed on first and last piece of equipment    Resistance Training Performed  Yes    VAD Patient?  No      Pain Assessment   Currently in Pain?  No/denies          Social History   Tobacco Use  Smoking Status Former Smoker  . Last attempt to quit: 07/24/2007  . Years since quitting: 10.1  Smokeless Tobacco Never Used    Goals Met:  Independence with exercise equipment Exercise tolerated well No report of cardiac concerns or symptoms Strength training completed today  Goals Unmet:  Not Applicable  Comments: Pt able to follow exercise prescription today without complaint.  Will continue to monitor for progression.    Dr. Emily Filbert is Medical Director for [place] and [facility].

## 2017-09-18 NOTE — Progress Notes (Signed)
Cardiac Individual Treatment Plan  Patient Details  Name: Todd Weaver MRN: [id number] Date of Birth: 07-10-1951 Referring Provider:     Cardiac Rehab from 08/01/2017 in [facility] Cardiac and Pulmonary Rehab  Referring Provider  Arida      Initial Encounter Date:    Cardiac Rehab from 08/01/2017 in [facility] Cardiac and Pulmonary Rehab  Date  08/01/17  Referring Provider  Fletcher Anon      Visit Diagnosis: ST elevation myocardial infarction (STEMI), unspecified artery [facility])  Status post coronary artery stent placement  Patient's Home Medications on Admission:  Current Outpatient Medications:  .  aspirin EC 81 MG tablet, Take 81 mg by mouth daily. , Disp: , Rfl:  .  atorvastatin (LIPITOR) 80 MG tablet, Take 1 tablet (80 mg total) by mouth daily at 6 PM., Disp: 90 tablet, Rfl: 3 .  BRILINTA 90 MG TABS tablet, TAKE 1 TABLET(90 MG) BY MOUTH TWICE DAILY, Disp: 60 tablet, Rfl: 0 .  furosemide (LASIX) 20 MG tablet, TAKE 1 TABLET(20 MG) BY MOUTH DAILY, Disp: 30 tablet, Rfl: 3 .  losartan (COZAAR) 25 MG tablet, Take 0.5 tablets (12.5 mg total) by mouth daily., Disp: 30 tablet, Rfl: 0 .  magnesium oxide (MAG-OX) 400 (241.3 Mg) MG tablet, Take 1 tablet (400 mg total) by mouth daily., Disp: 30 tablet, Rfl: 0 .  metoprolol tartrate (LOPRESSOR) 25 MG tablet, Take 0.5 tablets (12.5 mg total) by mouth 2 (two) times daily., Disp: 30 tablet, Rfl: 2 .  nitroGLYCERIN (NITROSTAT) 0.4 MG SL tablet, Place 1 tablet (0.4 mg total) under the tongue every 5 (five) minutes as needed for chest pain., Disp: 30 tablet, Rfl: 0 .  potassium chloride (K-DUR,KLOR-CON) 10 MEQ tablet, TAKE 1 TABLET(10 MEQ) BY MOUTH DAILY, Disp: 30 tablet, Rfl: 3 .  tamsulosin (FLOMAX) 0.4 MG CAPS capsule, Take 1 capsule (0.4 mg total) by mouth daily., Disp: 90 capsule, Rfl: 3  Past Medical History: Past Medical History:  Diagnosis Date  . Acute ST elevation myocardial infarction (STEMI) of inferior wall ([place]) 07/18/2017  . CAD (coronary artery  disease)    a. nuc stress test 9/17: small defect of mild severity in the apex location, partially reversible and suspected to be due to artifact given intense GI uptake, EF 55-65%, low risk study; b. inferior STEMI 07/18/17  . GERD (gastroesophageal reflux disease)   . Hemorrhoid   . Hyperlipidemia   . Hypertension   . Stroke [facility]) 2011   "2 MINI STROKES"    Tobacco Use: Social History   Tobacco Use  Smoking Status Former Smoker  . Last attempt to quit: 07/24/2007  . Years since quitting: 10.1  Smokeless Tobacco Never Used    Labs: Recent Review Scientist, physiological    Labs for ITP Cardiac and Pulmonary Rehab Latest Ref Rng & Units 06/19/2017 07/17/2017 07/18/2017   Cholestrol 0 - 200 mg/dL 235(H) 223(H) 216(H)   LDLCALC 0 - 99 mg/dL 155(H) 130(H) 129(H)   HDL >40 mg/dL 61 73 66   Trlycerides <150 mg/dL 93 99 106   Hemoglobin A1c 4.8 - 5.6 % - - 5.8(H)       Exercise Target Goals:    Exercise Program Goal: Individual exercise prescription set using results from initial 6 min walk test and THRR while considering  patient's activity barriers and safety.   Exercise Prescription Goal: Initial exercise prescription builds to 30-45 minutes a day of aerobic activity, 2-3 days per week.  Home exercise guidelines will be given to patient during program  as part of exercise prescription that the participant will acknowledge.  Activity Barriers & Risk Stratification: Activity Barriers & Cardiac Risk Stratification - 08/01/17 1415      Activity Barriers & Cardiac Risk Stratification   Activity Barriers  Back Problems;Shortness of Breath back surgery May 2018    Cardiac Risk Stratification  Moderate       6 Minute Walk: 6 Minute Walk    Row Name 08/01/17 1446         6 Minute Walk   Distance  1475 feet     Walk Time  6 minutes     # of Rest Breaks  0     MPH  2.8     METS  3.64     RPE  11     Perceived Dyspnea   0     VO2 Peak  12.73     Symptoms  No     Resting HR  59  bpm     Resting BP  118/66     Resting Oxygen Saturation   92 %     Exercise Oxygen Saturation  during 6 min walk  99 %     Max Ex. HR  114 bpm     Max Ex. BP  128/66     2 Minute Post BP  112/66        Oxygen Initial Assessment:   Oxygen Re-Evaluation:   Oxygen Discharge (Final Oxygen Re-Evaluation):   Initial Exercise Prescription: Initial Exercise Prescription - 08/01/17 1400      Date of Initial Exercise RX and Referring Provider   Date  08/01/17    Referring Provider  Arida      Treadmill   MPH  2.8    Grade  1    Minutes  15    METs  3.5      Recumbant Bike   Level  3    RPM  60    Watts  40    Minutes  15    METs  3.5      NuStep   Level  3    SPM  80    Minutes  15    METs  3.5      REL-XR   Level  3    Speed  50    Minutes  15    METs  3.5      Prescription Details   Frequency (times per week)  3    Duration  Progress to 45 minutes of aerobic exercise without signs/symptoms of physical distress      Intensity   THRR 40-80% of Max Heartrate  97-137    Ratings of Perceived Exertion  11-13    Perceived Dyspnea  0-4      Resistance Training   Training Prescription  Yes    Weight  3 lb    Reps  10-15       Perform Capillary Blood Glucose checks as needed.  Exercise Prescription Changes: Exercise Prescription Changes    Row Name 08/01/17 1400             Response to Exercise   Blood Pressure (Admit)  118/66       Blood Pressure (Exercise)  128/66       Heart Rate (Admit)  59 bpm       Heart Rate (Exercise)  114 bpm       Heart Rate (Exit)  57 bpm  Oxygen Saturation (Admit)  92 %       Oxygen Saturation (Exit)  99 %       Rating of Perceived Exertion (Exercise)  11          Exercise Comments: Exercise Comments    Row Name 08/07/17 1725           Exercise Comments  First full day of exercise!  Patient was oriented to gym and equipment including functions, settings, policies, and procedures.  Patient's individual  exercise prescription and treatment plan were reviewed.  All starting workloads were established based on the results of the 6 minute walk test done at initial orientation visit.  The plan for exercise progression was also introduced and progression will be customized based on patient's performance and goals          Exercise Goals and Review: Exercise Goals    Row Name 08/01/17 1443             Exercise Goals   Increase Physical Activity  Yes       Intervention  Provide advice, education, support and counseling about physical activity/exercise needs.;Develop an individualized exercise prescription for aerobic and resistive training based on initial evaluation findings, risk stratification, comorbidities and participant's personal goals.       Expected Outcomes  Achievement of increased cardiorespiratory fitness and enhanced flexibility, muscular endurance and strength shown through measurements of functional capacity and personal statement of participant.       Increase Strength and Stamina  Yes       Intervention  Provide advice, education, support and counseling about physical activity/exercise needs.;Develop an individualized exercise prescription for aerobic and resistive training based on initial evaluation findings, risk stratification, comorbidities and participant's personal goals.       Expected Outcomes  Achievement of increased cardiorespiratory fitness and enhanced flexibility, muscular endurance and strength shown through measurements of functional capacity and personal statement of participant.       Able to understand and use rate of perceived exertion (RPE) scale  Yes       Intervention  Provide education and explanation on how to use RPE scale       Expected Outcomes  Short Term: Able to use RPE daily in rehab to express subjective intensity level;Long Term:  Able to use RPE to guide intensity level when exercising independently       Able to understand and use Dyspnea scale   Yes       Intervention  Provide education and explanation on how to use Dyspnea scale       Expected Outcomes  Short Term: Able to use Dyspnea scale daily in rehab to express subjective sense of shortness of breath during exertion;Long Term: Able to use Dyspnea scale to guide intensity level when exercising independently       Knowledge and understanding of Target Heart Rate Range (THRR)  Yes       Intervention  Provide education and explanation of THRR including how the numbers were predicted and where they are located for reference       Expected Outcomes  Short Term: Able to state/look up THRR;Short Term: Able to use daily as guideline for intensity in rehab;Long Term: Able to use THRR to govern intensity when exercising independently       Able to check pulse independently  Yes       Intervention  Provide education and demonstration on how to check pulse in carotid and radial arteries.;Review the importance  of being able to check your own pulse for safety during independent exercise       Expected Outcomes  Short Term: Able to explain why pulse checking is important during independent exercise;Long Term: Able to check pulse independently and accurately       Understanding of Exercise Prescription  Yes       Intervention  Provide education, explanation, and written materials on patient's individual exercise prescription       Expected Outcomes  Short Term: Able to explain program exercise prescription;Long Term: Able to explain home exercise prescription to exercise independently          Exercise Goals Re-Evaluation : Exercise Goals Re-Evaluation    Row Name 08/07/17 1725             Exercise Goal Re-Evaluation   Exercise Goals Review  Able to understand and use rate of perceived exertion (RPE) scale;Knowledge and understanding of Target Heart Rate Range (THRR);Understanding of Exercise Prescription       Comments  Reviewed RPE scale, THR and program prescription with pt today.  Pt  voiced understanding and was given a copy of goals to take home.        Expected Outcomes  Short: Use RPE daily to regulate intensity.  Long: Follow program prescription in THR.          Discharge Exercise Prescription (Final Exercise Prescription Changes): Exercise Prescription Changes - 08/01/17 1400      Response to Exercise   Blood Pressure (Admit)  118/66    Blood Pressure (Exercise)  128/66    Heart Rate (Admit)  59 bpm    Heart Rate (Exercise)  114 bpm    Heart Rate (Exit)  57 bpm    Oxygen Saturation (Admit)  92 %    Oxygen Saturation (Exit)  99 %    Rating of Perceived Exertion (Exercise)  11       Nutrition:  Target Goals: Understanding of nutrition guidelines, daily intake of sodium '1500mg'$ , cholesterol '200mg'$ , calories 30% from fat and 7% or less from saturated fats, daily to have 5 or more servings of fruits and vegetables.  Biometrics: Pre Biometrics - 08/01/17 1442      Pre Biometrics   Height  5' 7.5" (1.715 m)    Weight  179 lb 3.2 oz (81.3 kg)    Waist Circumference  38.25 inches    Hip Circumference  39.5 inches    Waist to Hip Ratio  0.97 %    BMI (Calculated)  27.64    Single Leg Stand  30 seconds        Nutrition Therapy Plan and Nutrition Goals: Nutrition Therapy & Goals - 08/01/17 1401      Intervention Plan   Intervention  Prescribe, educate and counsel regarding individualized specific dietary modifications aiming towards targeted core components such as weight, hypertension, lipid management, diabetes, heart failure and other comorbidities.;Nutrition handout(s) given to patient.    Expected Outcomes  Short Term Goal: Understand basic principles of dietary content, such as calories, fat, sodium, cholesterol and nutrients.;Short Term Goal: A plan has been developed with personal nutrition goals set during dietitian appointment.;Long Term Goal: Adherence to prescribed nutrition plan.       Nutrition Assessments: Nutrition Assessments - 08/01/17  1402      MEDFICTS Scores   Pre Score  16       Nutrition Goals Re-Evaluation:   Nutrition Goals Discharge (Final Nutrition Goals Re-Evaluation):   Psychosocial: Target Goals: Acknowledge presence  or absence of significant depression and/or stress, maximize coping skills, provide positive support system. Participant is able to verbalize types and ability to use techniques and skills needed for reducing stress and depression.   Initial Review & Psychosocial Screening: Initial Psych Review & Screening - 08/01/17 1405      Initial Review   Current issues with  Current Sleep Concerns;Current Stress Concerns    Comments  wakes up at 3 am every morning after his heart attack, stressed processing that he had a heart attack and what that means for his future. He wants to learn all he can about cardiac health.       Family Dynamics   Good Support System?  Yes wife, friends      Screening Interventions   Interventions  Yes;Encouraged to exercise    Expected Outcomes  Short Term goal: Utilizing psychosocial counselor, staff and physician to assist with identification of specific Stressors or current issues interfering with healing process. Setting desired goal for each stressor or current issue identified.;Long Term Goal: Stressors or current issues are controlled or eliminated.;Short Term goal: Identification and review with participant of any Quality of Life or Depression concerns found by scoring the questionnaire.;Long Term goal: The participant improves quality of Life and PHQ9 Scores as seen by post scores and/or verbalization of changes       Quality of Life Scores:  Quality of Life - 08/01/17 1412      Quality of Life Scores   Health/Function Pre  20.73 %    Socioeconomic Pre  20.88 %    Psych/Spiritual Pre  21 %    Family Pre  21 %    GLOBAL Pre  20.86 %      Scores of 19 and below usually indicate a poorer quality of life in these areas.  A difference of  2-3 points is a  clinically meaningful difference.  A difference of 2-3 points in the total score of the Quality of Life Index has been associated with significant improvement in overall quality of life, self-image, physical symptoms, and general health in studies assessing change in quality of life.  PHQ-9: Recent Review Flowsheet Data    Depression screen [facility] 2/9 08/01/2017 06/19/2017   Decreased Interest 0 0   Down, Depressed, Hopeless 0 0   PHQ - 2 Score 0 0   Altered sleeping 1 -   Tired, decreased energy 1 -   Change in appetite 0 -   Feeling bad or failure about yourself  0 -   Trouble concentrating 0 -   Moving slowly or fidgety/restless 0 -   Suicidal thoughts 0 -   PHQ-9 Score 2 -   Difficult doing work/chores Not difficult at all -     Interpretation of Total Score  Total Score Depression Severity:  1-4 = Minimal depression, 5-9 = Mild depression, 10-14 = Moderate depression, 15-19 = Moderately severe depression, 20-27 = Severe depression   Psychosocial Evaluation and Intervention: Psychosocial Evaluation - 08/07/17 1653      Psychosocial Evaluation & Interventions   Interventions  Encouraged to exercise with the program and follow exercise prescription    Comments  Counselor met with Mr. Urbas [place]) today for initial psychosocial evaluation.  He is a 67 year old who had a heart attack and 2 stents inserted on 12/28 (several weeks ago).  He has a strong support system with a spouse of 47 years; (2) daughters, a brother and sister who live close by and [employer]  is actively involved in his local church.  He had back surgery this past May and reports it was successful and less painful.  Kyian states he only sleeps 4-5 hours/night and that has been typical for the past year or so.  He has a good appetite.  Lamarion denies a history of depression or anxiety or any current symptoms, and states he is typically in a positive mood most of the time.  He has minimal stress in his life other than his  health concerns.  Joaquim has goals to get back to "100%" with his heart - increasing in stamina and strength.  Staff will follow him throughout the course of this program.     Expected Outcomes  Treyson will benefit from consistent exercise to achieve his stated goals.  The educational and psychoeducational components of this program will be helpful in learning more about his condition and ways to cope more positively with it.      Continue Psychosocial Services   Follow up required by staff       Psychosocial Re-Evaluation:   Psychosocial Discharge (Final Psychosocial Re-Evaluation):   Vocational Rehabilitation: Provide vocational rehab assistance to qualifying candidates.   Vocational Rehab Evaluation & Intervention: Vocational Rehab - 08/01/17 1413      Initial Vocational Rehab Evaluation & Intervention   Assessment shows need for Vocational Rehabilitation  No       Education: Education Goals: Education classes will be provided on a variety of topics geared toward better understanding of heart health and risk factor modification. Participant will state understanding/return demonstration of topics presented as noted by education test scores.  Learning Barriers/Preferences: Learning Barriers/Preferences - 08/01/17 1412      Learning Barriers/Preferences   Learning Barriers  None    Learning Preferences  Group Instruction       Education Topics:  AED/CPR: - Group verbal and written instruction with the use of models to demonstrate the basic use of the AED with the basic ABC's of resuscitation.   General Nutrition Guidelines/Fats and Fiber: -Group instruction provided by verbal, written material, models and posters to present the general guidelines for heart healthy nutrition. Gives an explanation and review of dietary fats and fiber.   Controlling Sodium/Reading Food Labels: -Group verbal and written material supporting the discussion of sodium use in heart healthy  nutrition. Review and explanation with models, verbal and written materials for utilization of the food label.   Exercise Physiology & General Exercise Guidelines: - Group verbal and written instruction with models to review the exercise physiology of the cardiovascular system and associated critical values. Provides general exercise guidelines with specific guidelines to those with heart or lung disease.    Aerobic Exercise & Resistance Training: - Gives group verbal and written instruction on the various components of exercise. Focuses on aerobic and resistive training programs and the benefits of this training and how to safely progress through these programs..   Cardiac Rehab from 09/16/2017 in [facility] Cardiac and Pulmonary Rehab  Date  09/16/17  Educator  AS  Instruction Review Code  1- Verbalizes Understanding      Flexibility, Balance, Mind/Body Relaxation: Provides group verbal/written instruction on the benefits of flexibility and balance training, including mind/body exercise modes such as yoga, pilates and tai chi.  Demonstration and skill practice provided.   Stress and Anxiety: - Provides group verbal and written instruction about the health risks of elevated stress and causes of high stress.  Discuss the correlation between heart/lung disease and anxiety  and treatment options. Review healthy ways to manage with stress and anxiety.   Cardiac Rehab from 09/16/2017 in [facility] Cardiac and Pulmonary Rehab  Date  08/14/17  Educator  [facility]  Instruction Review Code  1- Verbalizes Understanding      Depression: - Provides group verbal and written instruction on the correlation between heart/lung disease and depressed mood, treatment options, and the stigmas associated with seeking treatment.   Anatomy & Physiology of the Heart: - Group verbal and written instruction and models provide basic cardiac anatomy and physiology, with the coronary electrical and arterial systems. Review of  Valvular disease and Heart Failure   Cardiac Rehab from 09/16/2017 in [facility] Cardiac and Pulmonary Rehab  Date  08/12/17  Educator  CE  Instruction Review Code  1- Verbalizes Understanding      Cardiac Procedures: - Group verbal and written instruction to review commonly prescribed medications for heart disease. Reviews the medication, class of the drug, and side effects. Includes the steps to properly store meds and maintain the prescription regimen. (beta blockers and nitrates)   Cardiac Medications I: - Group verbal and written instruction to review commonly prescribed medications for heart disease. Reviews the medication, class of the drug, and side effects. Includes the steps to properly store meds and maintain the prescription regimen.   Cardiac Rehab from 09/16/2017 in [facility] Cardiac and Pulmonary Rehab  Date  08/07/17 Marisue Humble 2]  Educator  CE  Instruction Review Code  1- Verbalizes Understanding      Cardiac Medications II: -Group verbal and written instruction to review commonly prescribed medications for heart disease. Reviews the medication, class of the drug, and side effects. (all other drug classes)    Go Sex-Intimacy & Heart Disease, Get SMART - Goal Setting: - Group verbal and written instruction through game format to discuss heart disease and the return to sexual intimacy. Provides group verbal and written material to discuss and apply goal setting through the application of the S.M.A.R.T. Method.   Other Matters of the Heart: - Provides group verbal, written materials and models to describe Stable Angina and Peripheral Artery. Includes description of the disease process and treatment options available to the cardiac patient.   Exercise & Equipment Safety: - Individual verbal instruction and demonstration of equipment use and safety with use of the equipment.   Cardiac Rehab from 09/16/2017 in [facility] Cardiac and Pulmonary Rehab  Date  08/01/17  Educator  [facility]  Instruction  Review Code  1- Verbalizes Understanding      Infection Prevention: - Provides verbal and written material to individual with discussion of infection control including proper hand washing and proper equipment cleaning during exercise session.   Cardiac Rehab from 09/16/2017 in [facility] Cardiac and Pulmonary Rehab  Date  08/01/17  Educator  [facility]  Instruction Review Code  1- Verbalizes Understanding      Falls Prevention: - Provides verbal and written material to individual with discussion of falls prevention and safety.   Cardiac Rehab from 09/16/2017 in [facility] Cardiac and Pulmonary Rehab  Date  08/01/17  Educator  [facility]  Instruction Review Code  1- Verbalizes Understanding      Diabetes: - Individual verbal and written instruction to review signs/symptoms of diabetes, desired ranges of glucose level fasting, after meals and with exercise. Acknowledge that pre and post exercise glucose checks will be done for 3 sessions at entry of program.   Know Your Numbers and Risk Factors: -Group verbal and written instruction about important numbers in your  health.  Discussion of what are risk factors and how they play a role in the disease process.  Review of Cholesterol, Blood Pressure, Diabetes, and BMI and the role they play in your overall health.   Sleep Hygiene: -Provides group verbal and written instruction about how sleep can affect your health.  Define sleep hygiene, discuss sleep cycles and impact of sleep habits. Review good sleep hygiene tips.    Other: -Provides group and verbal instruction on various topics (see comments)   Knowledge Questionnaire Score: Knowledge Questionnaire Score - 08/01/17 1413      Knowledge Questionnaire Score   Pre Score  18/28 correct answers reviewed with Simona Huh       Core Components/Risk Factors/Patient Goals at Admission: Personal Goals and Risk Factors at Admission - 08/01/17 1354      Core Components/Risk Factors/Patient Goals on Admission     Weight Management  Yes;Obesity;Weight Loss    Intervention  Weight Management: Develop a combined nutrition and exercise program designed to reach desired caloric intake, while maintaining appropriate intake of nutrient and fiber, sodium and fats, and appropriate energy expenditure required for the weight goal.;Weight Management: Provide education and appropriate resources to help participant work on and attain dietary goals.;Weight Management/Obesity: Establish reasonable short term and long term weight goals.;Obesity: Provide education and appropriate resources to help participant work on and attain dietary goals.    Admit Weight  179 lb 3.2 oz (81.3 kg)    Goal Weight: Short Term  174 lb (78.9 kg)    Goal Weight: Long Term  165 lb (74.8 kg)    Expected Outcomes  Short Term: Continue to assess and modify interventions until short term weight is achieved;Long Term: Adherence to nutrition and physical activity/exercise program aimed toward attainment of established weight goal;Weight Loss: Understanding of general recommendations for a balanced deficit meal plan, which promotes 1-2 lb weight loss per week and includes a negative energy balance of [phone] kcal/d;Understanding recommendations for meals to include 15-35% energy as protein, 25-35% energy from fat, 35-60% energy from carbohydrates, less than '200mg'$  of dietary cholesterol, 20-35 gm of total fiber daily;Understanding of distribution of calorie intake throughout the day with the consumption of 4-5 meals/snacks    Hypertension  Yes    Intervention  Provide education on lifestyle modifcations including regular physical activity/exercise, weight management, moderate sodium restriction and increased consumption of fresh fruit, vegetables, and low fat dairy, alcohol moderation, and smoking cessation.;Monitor prescription use compliance.    Expected Outcomes  Short Term: Continued assessment and intervention until BP is < 140/66m HG in hypertensive  participants. < 130/883mHG in hypertensive participants with diabetes, heart failure or chronic kidney disease.;Long Term: Maintenance of blood pressure at goal levels.    Lipids  Yes    Intervention  Provide education and support for participant on nutrition & aerobic/resistive exercise along with prescribed medications to achieve LDL '70mg'$ , HDL >'40mg'$ .    Expected Outcomes  Short Term: Participant states understanding of desired cholesterol values and is compliant with medications prescribed. Participant is following exercise prescription and nutrition guidelines.;Long Term: Cholesterol controlled with medications as prescribed, with individualized exercise RX and with personalized nutrition plan. Value goals: LDL < '70mg'$ , HDL > 40 mg.    Stress  Yes DeKaicens stressed about having a heart attack. He says he has never had any type of heart issues before, so this is all new.    Intervention  Offer individual and/or small group education and counseling on adjustment to heart disease,  stress management and health-related lifestyle change. Teach and support self-help strategies.;Refer participants experiencing significant psychosocial distress to appropriate mental health specialists for further evaluation and treatment. When possible, include family members and significant others in education/counseling sessions.    Expected Outcomes  Short Term: Participant demonstrates changes in health-related behavior, relaxation and other stress management skills, ability to obtain effective social support, and compliance with psychotropic medications if prescribed.;Long Term: Emotional wellbeing is indicated by absence of clinically significant psychosocial distress or social isolation.       Core Components/Risk Factors/Patient Goals Review:    Core Components/Risk Factors/Patient Goals at Discharge (Final Review):    ITP Comments: ITP Comments    Row Name 08/01/17 1344 08/21/17 0638 08/28/17 1212 09/12/17  1646 09/18/17 0602   ITP Comments  Med review completed. Initial ITP created. Diagnosis can be found in [facility] 07/20/17  30 Day review. Continue with ITP unless directed changes per Medical Director review.  New to program.  1/28 had another coronary stent placed, will wait for medical clearance to return to the program  [facility] had a recent cath and Dr Fletcher Anon wants to wait until the next plan of treatment is decided before he continues exercise.  Returns today, no med changes after third stent staged and completed  30 day review. Continue with ITP unless directed changes per Medical Director review.    1 visit this month      Comments:

## 2017-09-19 DIAGNOSIS — Z48812 Encounter for surgical aftercare following surgery on the circulatory system: Secondary | ICD-10-CM | POA: Diagnosis not present

## 2017-09-19 DIAGNOSIS — I213 ST elevation (STEMI) myocardial infarction of unspecified site: Secondary | ICD-10-CM

## 2017-09-19 NOTE — Progress Notes (Signed)
Daily Session Note  Patient Details  Name: Todd Weaver MRN: [id number] Date of Birth: 1951/02/21 Referring Provider:     Cardiac Rehab from 08/01/2017 in [facility] Cardiac and Pulmonary Rehab  Referring Provider  Arida      Encounter Date: 09/19/2017  Check In: Session Check In - 09/19/17 1707      Check-In   Location  [facility]-Cardiac & Pulmonary Rehab    Staff Present  Earlean Shawl, BS, ACSM CEP, Exercise Physiologist;Joseph Alcus Dad, RN BSN    Supervising physician immediately available to respond to emergencies  See telemetry face sheet for immediately available ER MD    Medication changes reported      No    Fall or balance concerns reported     No    Warm-up and Cool-down  Performed on first and last piece of equipment    Resistance Training Performed  Yes    VAD Patient?  No      Pain Assessment   Currently in Pain?  No/denies          Social History   Tobacco Use  Smoking Status Former Smoker  . Last attempt to quit: 07/24/2007  . Years since quitting: 10.1  Smokeless Tobacco Never Used    Goals Met:  Independence with exercise equipment Exercise tolerated well No report of cardiac concerns or symptoms Strength training completed today  Goals Unmet:  Not Applicable  Comments: Pt able to follow exercise prescription today without complaint.  Will continue to monitor for progression.   Dr. Emily Filbert is Medical Director for [place] and [facility].

## 2017-09-20 ENCOUNTER — Other Ambulatory Visit: Payer: Self-pay

## 2017-09-20 MED ORDER — ATORVASTATIN CALCIUM 80 MG PO TABS: 80.0000 mg | ORAL_TABLET | Freq: Every day | ORAL | 3 refills | Status: DC

## 2017-09-23 ENCOUNTER — Encounter: Payer: 59 | Attending: Cardiovascular Disease

## 2017-09-23 ENCOUNTER — Encounter: Payer: Self-pay | Admitting: Family Medicine

## 2017-09-23 ENCOUNTER — Ambulatory Visit: Payer: 59 | Admitting: Family Medicine

## 2017-09-23 VITALS — BP 118/78 | HR 62 | Resp 16 | Ht 67.0 in | Wt 189.4 lb

## 2017-09-23 DIAGNOSIS — R400 Somnolence: Secondary | ICD-10-CM | POA: Diagnosis not present

## 2017-09-23 DIAGNOSIS — N401 Enlarged prostate with lower urinary tract symptoms: Secondary | ICD-10-CM

## 2017-09-23 DIAGNOSIS — Z955 Presence of coronary angioplasty implant and graft: Secondary | ICD-10-CM

## 2017-09-23 DIAGNOSIS — E663 Overweight: Secondary | ICD-10-CM | POA: Diagnosis not present

## 2017-09-23 DIAGNOSIS — Z48812 Encounter for surgical aftercare following surgery on the circulatory system: Secondary | ICD-10-CM | POA: Diagnosis not present

## 2017-09-23 DIAGNOSIS — I213 ST elevation (STEMI) myocardial infarction of unspecified site: Secondary | ICD-10-CM | POA: Diagnosis not present

## 2017-09-23 DIAGNOSIS — I25118 Atherosclerotic heart disease of native coronary artery with other forms of angina pectoris: Secondary | ICD-10-CM

## 2017-09-23 DIAGNOSIS — M4716 Other spondylosis with myelopathy, lumbar region: Secondary | ICD-10-CM

## 2017-09-23 DIAGNOSIS — E785 Hyperlipidemia, unspecified: Secondary | ICD-10-CM | POA: Diagnosis not present

## 2017-09-23 DIAGNOSIS — R351 Nocturia: Secondary | ICD-10-CM | POA: Diagnosis not present

## 2017-09-23 MED ORDER — METOPROLOL SUCCINATE ER 25 MG PO TB24: 25.0000 mg | ORAL_TABLET | Freq: Every day | ORAL | 2 refills | Status: DC

## 2017-09-23 MED ORDER — LOSARTAN POTASSIUM 25 MG PO TABS: 25.0000 mg | ORAL_TABLET | Freq: Every day | ORAL | 2 refills | Status: DC

## 2017-09-23 NOTE — Progress Notes (Signed)
Daily Session Note  Patient Details  Name: Todd Weaver MRN: [id number] Date of Birth: Nov 05, 1950 Referring Provider:     Cardiac Rehab from 08/01/2017 in [facility] Cardiac and Pulmonary Rehab  Referring Provider  Arida      Encounter Date: 09/23/2017  Check In: Session Check In - 09/23/17 1724      Check-In   Location  [facility]-Cardiac & Pulmonary Rehab    Staff Present  Renita Papa, RN Moises Blood, BS, ACSM CEP, Exercise Physiologist;Amanda Oletta Darter, [place], ACSM CEP, Exercise Physiologist;Carroll Enterkin, RN, BSN    Supervising physician immediately available to respond to emergencies  See telemetry face sheet for immediately available ER MD    Medication changes reported      No    Fall or balance concerns reported     No    Warm-up and Cool-down  Performed on first and last piece of equipment    Resistance Training Performed  Yes    VAD Patient?  No      Pain Assessment   Currently in Pain?  No/denies    Multiple Pain Sites  No          Social History   Tobacco Use  Smoking Status Former Smoker  . Last attempt to quit: 07/24/2007  . Years since quitting: 10.1  Smokeless Tobacco Never Used    Goals Met:  Independence with exercise equipment Exercise tolerated well No report of cardiac concerns or symptoms Strength training completed today  Goals Unmet:  Not Applicable  Comments: Pt able to follow exercise prescription today without complaint.  Will continue to monitor for progression.    Dr. Emily Filbert is Medical Director for [place] [facility].

## 2017-09-23 NOTE — Progress Notes (Signed)
Cardiology Office Note Date:  09/24/2017  Patient ID:  Todd, Weaver [birth year], MRN [id number] PCP:  Adline Potter, MD  Cardiologist:  Dr. Fletcher Anon, MD    Chief Complaint: Hospital follow up  History of Present Illness: Todd Weaver is a 67 y.o. male with history of recent inferior ST elevation MI on 07/18/2017 s/p PCI/DES x 2 to the RCA with residual disease involving the distal left main/ostial LAD, proximal LAD, mid LAD, and LCx as detailed below, ICM/chronic systolic CHF, PAF, "mild strokes" x 2 ~ 8-10 years prior, HLD, prior tobacco abuse, ED, back pain, BPH, and mild cognitive impairment who presents for hospital follow up after diagnostic LHC on 2/8 as below.   Patient was previously seen by Dr. Yvone Neu for hypertension and bradycardia. He was initially seen in 02/2016 for evaluation of fatigue, weakness, and SOB. Heart rate noted to be 49 bpm at initial visit on atenolol. Beta blocker was reduced. He underwent nuclear stress testing in 03/2016 that showed a small defect of mild severity in the apex location, partially reversible and suspected to be due to artifact given intense GI uptake, EF 55-65%. Overall, this was a low risk study. Echo in 03/2016 showed an EF of 55-60%, normal wall motion, normal LV diastolic function, no significant valvular abnormalities. He was seen on 09/25/2016 for follow up and was doing well at that time, improved off beta blocker. He was admitted to [facility] on 07/18/2017 with an acute inferior ST elevation MI complicated by VF x 2 requiring brief episode of CPR and defibrillation x 2. He underwent successful PCI/DES x 2 to the RCA with residual 85% stenosed distal left main/ostial LAD, proximal LAD 60% stenosed, mid LAD 40% stenosed, proximal LCx 40% stenosed, post atrio 60% stenosed. Case was complicated by hypotension throughout the PCI likely due to RV involvement that responded to IV fluids and norepinephrine gtt. Troponin peaked at 44.68 Post-procedure, in the  ICU, he developed new onset Afib with RVR that was successfully treated with IV amiodarone, leading to conversion to sinus rhythm. He was not placed on triple therapy given this was a brief episode of Afib. Post-procedure echo showed an EF of 45-50%, moderate hypokinesis of the inferior and inferoseptal myocardium. He was gently diuresed with improvement in breathing. He was discharged on DAPT with ASA and Brilinta along with amiodarone, Lipitor, losartan, magnesium oxide, Lopressor, SL NTG, along with his noncardiac medications. Discharge weight of 187 pounds. He returned to [facility] 12/31 with 30-45 minutes of SOB with left hand parasthesias. Symptoms did not feel like prior MI. Troponin was noted to still be down trending from prior, recent admission. EKG was not acute. He was asymptomatic in the ED. ED weight of 187 pounds. It was felt his SOB may have been mild pulmonary edema and it was recommended he take Lasix 20 mg daily with KCl 10 mEq daily. He was seen in the office on 08/15/2017 for hospital follow up and noted exertional chest pain and SOB. He was compliant with all medications. He underwent repeat LHC on 08/19/2017 that showed significant two-vessel CAD with widely patient stents in the RCA without any restenosis. Significant ostial LAD stenosis was unchanged with moderate disease in the proximal and mid LAD segment. Case was discussed at conference with recommendation to proceed with IVUS-guided intervention of the LAD. He presented to [facility] 08/30/17 for planned outpatient diagnostic LHC that showed left main, minimal luminal irregs, distal left main to ostial LAD 85%, proximal LAD 60%, mid  LAD 30%, ostial ramus 50%, proximal LCx 40%, patent stent in the RCA. He underwent successful IVUS-guided PCI/DES to the ostial and proximal LAD with one long stent (Sierra 3.0 x 38 mm).  He comes in doing well today.  No further chest pain.  Tolerating all medications.  Has not missed any doses of aspirin or Brilinta.   He was seen by PCP on 3/4 and changed from Lopressor to Toprol XL 25 mg daily.  He has yet to pick this medication up.  He last took Lopressor 12.5 on the evening of 3/4.  He does note some mild shortness of breath.  Has gone back to chopping wood outdoors without any issues.  He is anxious to get back to work.  He is tolerating cardiac rehab without any issues.  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  He is eating a heart healthy diet and watching his sodium/salt intake.  No issues from cardiac cath site.  No melena or BRBPR.  No falls.  Weight stable.   Past Medical History:  Diagnosis Date  . Acute ST elevation myocardial infarction (STEMI) of inferior wall ([place]) 07/18/2017  . CAD (coronary artery disease)    a. nuc stress test 9/17: small defect of mild severity in the apex location, partially reversible and suspected to be due to artifact given intense GI uptake, EF 55-65%, low risk study; b. inferior STEMI 07/18/17  . GERD (gastroesophageal reflux disease)   . Hemorrhoid   . Hyperlipidemia   . Hypertension   . Stroke [facility]) 2011   "2 MINI STROKES"    Past Surgical History:  Procedure Laterality Date  . ANAL FISTULECTOMY N/A 12/21/2014   Procedure: FISTULECTOMY ANAL;  Surgeon: Christene Lye, MD;  Location: [facility] ORS;  Service: General;  Laterality: N/A;  . APPENDECTOMY  1975  . CORONARY STENT INTERVENTION N/A 09/09/2017   Procedure: CORONARY STENT INTERVENTION;  Surgeon: Wellington Hampshire, MD;  Location: [place] CV LAB;  Service: Cardiovascular;  Laterality: N/A;  . CORONARY/GRAFT ACUTE MI REVASCULARIZATION N/A 07/18/2017   Procedure: Coronary/Graft Acute MI Revascularization;  Surgeon: Wellington Hampshire, MD;  Location: [place] CV LAB;  Service: Cardiovascular;  Laterality: N/A;  . HEMORRHOID SURGERY N/A 12/21/2014   Procedure: HEMORRHOIDECTOMY;  Surgeon: Christene Lye, MD;  Location: [facility] ORS;  Service: General;  Laterality: N/A;  .  INTRAVASCULAR ULTRASOUND/IVUS N/A 09/09/2017   Procedure: Intravascular Ultrasound/IVUS;  Surgeon: Wellington Hampshire, MD;  Location: [place] CV LAB;  Service: Cardiovascular;  Laterality: N/A;  . KNEE SURGERY Right age 52  . LEFT HEART CATH AND CORONARY ANGIOGRAPHY N/A 07/18/2017   Procedure: LEFT HEART CATH AND CORONARY ANGIOGRAPHY;  Surgeon: Wellington Hampshire, MD;  Location: [place] CV LAB;  Service: Cardiovascular;  Laterality: N/A;  . LEFT HEART CATH AND CORONARY ANGIOGRAPHY Left 08/19/2017   Procedure: LEFT HEART CATH AND CORONARY ANGIOGRAPHY;  Surgeon: Wellington Hampshire, MD;  Location: [place] CV LAB;  Service: Cardiovascular;  Laterality: Left;  . ROTATOR CUFF REPAIR Right 2013    Current Meds  Medication Sig  . aspirin EC 81 MG tablet Take 81 mg by mouth daily.   Marland Kitchen atorvastatin (LIPITOR) 80 MG tablet Take 1 tablet (80 mg total) by mouth daily at 6 PM.  . BRILINTA 90 MG TABS tablet TAKE 1 TABLET(90 MG) BY MOUTH TWICE DAILY  . losartan (COZAAR) 25 MG tablet Take 1 tablet (25 mg total) by mouth daily.  . metoprolol succinate (TOPROL-XL)  25 MG 24 hr tablet Take 1 tablet (25 mg total) by mouth daily.  . nitroGLYCERIN (NITROSTAT) 0.4 MG SL tablet Place 1 tablet (0.4 mg total) under the tongue every 5 (five) minutes as needed for chest pain.  . tamsulosin (FLOMAX) 0.4 MG CAPS capsule Take 1 capsule (0.4 mg total) by mouth daily.    Allergies:   Codeine; Shellfish allergy; and Contrast media [iodinated diagnostic agents]   Social History:  The patient  reports that he quit smoking about 10 years ago. he has never used smokeless tobacco. He reports that he drinks alcohol. He reports that he does not use drugs.   Family History:  The patient's family history includes Alzheimer's disease in his father and mother; Hypertension in his father and mother.  ROS:   Review of Systems  Constitutional: Negative for chills, diaphoresis, fever, malaise/fatigue and weight loss.  HENT:  Negative for congestion.   Eyes: Negative for discharge and redness.  Respiratory: Positive for shortness of breath. Negative for cough, hemoptysis, sputum production and wheezing.   Cardiovascular: Negative for chest pain, palpitations, orthopnea, claudication, leg swelling and PND.  Gastrointestinal: Negative for abdominal pain, blood in stool, heartburn, melena, nausea and vomiting.  Genitourinary: Negative for hematuria.  Musculoskeletal: Negative for falls and myalgias.  Skin: Negative for rash.  Neurological: Negative for dizziness, tingling, tremors, sensory change, speech change, focal weakness, loss of consciousness and weakness.  Endo/Heme/Allergies: Does not bruise/bleed easily.  Psychiatric/Behavioral: Negative for substance abuse. The patient is not nervous/anxious.   All other systems reviewed and are negative.    PHYSICAL EXAM:  VS:  BP 126/70 (BP Location: Left Arm, Patient Position: Sitting, Cuff Size: Normal)   Pulse 60   Ht 5\' 7"  (1.702 m)   Wt 185 lb (83.9 kg)   BMI 28.98 kg/m  BMI: Body mass index is 28.98 kg/m.  Physical Exam  Constitutional: He is oriented to person, place, and time. He appears well-developed and well-nourished.  HENT:  Head: Normocephalic and atraumatic.  Eyes: Right eye exhibits no discharge. Left eye exhibits no discharge.  Neck: Normal range of motion. No JVD present.  Cardiovascular: Normal rate, regular rhythm, S1 normal, S2 normal and normal heart sounds. Exam reveals no distant heart sounds, no friction rub, no midsystolic click and no opening snap.  No murmur heard. Pulses:      Posterior tibial pulses are 2+ on the right side, and 2+ on the left side.  Right radial cardiac catheterization site well-healing without any bleeding, bruising, swelling, erythema, tenderness to palpation, warmth.  Radial pulse 2+.  Pulmonary/Chest: Effort normal and breath sounds normal. No respiratory distress. He has no decreased breath sounds. He has no  wheezes. He has no rales. He exhibits no tenderness.  Abdominal: Soft. He exhibits no distension. There is no tenderness.  Musculoskeletal: He exhibits no edema.  Neurological: He is alert and oriented to person, place, and time.  Skin: Skin is warm and dry. No cyanosis. Nails show no clubbing.  Psychiatric: He has a normal mood and affect. His speech is normal and behavior is normal. Judgment and thought content normal.     EKG:  Was ordered and interpreted by me today. Shows NSR, 60 bpm, nonspecific st/t changes  Recent Labs: 07/18/2017: ALT 31 07/19/2017: TSH 1.232 07/20/2017: Magnesium 1.9 09/10/2017: BUN 14; Creatinine, Ser 0.95; Hemoglobin 12.9; Platelets 273; Potassium 3.6; Sodium 139  07/18/2017: Cholesterol 216; HDL 66; LDL Cholesterol 129; Total CHOL/HDL Ratio 3.3; Triglycerides 106; VLDL 21  Estimated Creatinine Clearance: 81.3 mL/min (by C-G formula based on SCr of 0.95 mg/dL).   Wt Readings from Last 3 Encounters:  09/24/17 185 lb (83.9 kg)  09/23/17 189 lb 6.4 oz (85.9 kg)  09/09/17 183 lb (83 kg)     Other studies reviewed: Additional studies/records reviewed today include: summarized above  ASSESSMENT AND PLAN:  1. CAD involving the native coronary arteries without angina: No symptoms concerning for chest pain.  Continue dual antiplatelet therapy with aspirin 81 mg daily and Brilinta 90 mg twice daily.  He will likely remain on dual antiplatelet therapy indefinitely given stenting as detailed above.  Aggressive risk factor modification and secondary prevention.  Cardiac rehab.  No plans for further ischemic evaluation at this time.  He does note some mild shortness of breath.  This may be secondary to his Brilinta.  He was advised to take this medication with a small amount of a caffeinated beverage.  Should symptoms persist we may need to consider transitioning him to a different antiplatelet medication, though I would defer this decision to interventional cardiology.   He has been cleared to return to work at full duty.  2. Chronic systolic CHF/ICM: He does not appear volume overloaded at this time.  Continue losartan 25 mg daily.  I will decrease his Toprol-XL to 12.5 mg daily given his prior history of sinus bradycardia associated with atenolol.  Current heart rate of 60 bpm with last dose of beta-blocker being the evening of 3/4.  He is not currently on a standing diuretic.  CHF education provided.  Daily weights.  Call for weight gain greater than 3 pounds overnight or 5 pounds in 1 week time span.  Consider repeat limited echocardiogram at follow-up in 3 months.  3. PAF: Noted post myocardial infarction with associated hypotension in December, 2018.  No evidence of recurrent arrhythmia while off of amiodarone.  Should he develop recurrent atrial fibrillation in the future, we will need to consider the addition of oral anticoagulation.  4. HLD: Goal LDL < 70. Check lipid and cmet today. Continue Lipitor 80 mg daily. If LDL remains above goal, plan to add Zetia 10 mg daily.   5. Leukocytosis: Check CBC.  Disposition: F/u with Dr. Fletcher Anon in 3 months.  Current medicines are reviewed at length with the patient today.  The patient did not have any concerns regarding medicines.  Signed, [name], PA-C 09/24/2017 1:14 PM     [place], [place] [id number] [phone]

## 2017-09-23 NOTE — Progress Notes (Signed)
Date:  09/23/2017   Name:  Todd Weaver   DOB:  1951-06-13   MRN:  [id number]  PCP:  Adline Potter, MD    Chief Complaint: Hospitalization Follow-up (Cardiac Stent Plced 3 of them. D/C 09/10/2017 ) and Hypertension (Losartan needs refilled if not part of recall on news. Pharmacy has not called about his RX being recalled at all. )   History of Present Illness:  This is a 67 y.o. male seen for three month f/u. Had STEMI in December with stenting, restented last month, on DAPT x 1 year. No NTG use past three weeks. C/o dry mouth and SOB at night with daytime sleepiness. New meds include Lasix, metoprolol, losartan, K+, and Mg++. BPH sxs well controlled on Flomax, L shoulder pain resolved. To see Dr. Fletcher Anon tomorrow. Asks about need for f/u colonoscopy, initial exam negative.  Review of Systems:  Review of Systems  Constitutional: Negative for chills and fever.  Respiratory: Negative for cough.   Cardiovascular: Negative for chest pain, palpitations and leg swelling.  Genitourinary: Negative for difficulty urinating.  Neurological: Negative for syncope and light-headedness.    Patient Active Problem List   Diagnosis Date Noted  . Daytime sleepiness 09/23/2017  . Effort angina ([place]) 09/09/2017  . Contraindication to percutaneous coronary intervention (PCI) 09/09/2017  . Coronary artery disease 08/15/2017  . Shortness of breath 07/22/2017  . Acute myocardial infarction of inferior wall ([place]) 07/18/2017  . STEMI (ST elevation myocardial infarction) ([place]) 07/18/2017  . Hypertension 06/19/2017  . Hyperlipidemia 06/19/2017  . History of CVA (cerebrovascular accident) 06/19/2017  . Overweight (BMI 25.0-29.9) 06/19/2017  . BPH (benign prostatic hyperplasia) 06/19/2017  . ED (erectile dysfunction) 06/19/2017  . MCI (mild cognitive impairment) 06/19/2017  . FH: Alzheimer's disease 06/19/2017  . Lumbar spondylosis with myelopathy 11/08/2016  . Hand dermatitis 07/26/2014    Prior to  Admission medications   Medication Sig Start Date End Date Taking? Authorizing Provider  aspirin EC 81 MG tablet Take 81 mg by mouth daily.  05/23/09  Yes [provider]  atorvastatin (LIPITOR) 80 MG tablet Take 1 tablet (80 mg total) by mouth daily at 6 PM. 09/20/17  Yes Affan Callow, Gwyndolyn Saxon, MD  BRILINTA 90 MG TABS tablet TAKE 1 TABLET(90 MG) BY MOUTH TWICE DAILY 09/17/17  Yes Wellington Hampshire, MD  losartan (COZAAR) 25 MG tablet Take 1 tablet (25 mg total) by mouth daily. 09/23/17  Yes Shyia Fillingim, Gwyndolyn Saxon, MD  nitroGLYCERIN (NITROSTAT) 0.4 MG SL tablet Place 1 tablet (0.4 mg total) under the tongue every 5 (five) minutes as needed for chest pain. 07/20/17  Yes Wieting, Richard, MD  tamsulosin (FLOMAX) 0.4 MG CAPS capsule Take 1 capsule (0.4 mg total) by mouth daily. 09/16/17  Yes Reem Fleury, Gwyndolyn Saxon, MD  metoprolol succinate (TOPROL-XL) 25 MG 24 hr tablet Take 1 tablet (25 mg total) by mouth daily. 09/23/17   Adline Potter, MD    Allergies  Allergen Reactions  . Codeine Hives  . Shellfish Allergy Hives and Swelling  . Contrast Media [Iodinated Diagnostic Agents] Itching and Other (See Comments)    Whelps on tongue    Past Surgical History:  Procedure Laterality Date  . ANAL FISTULECTOMY N/A 12/21/2014   Procedure: FISTULECTOMY ANAL;  Surgeon: Christene Lye, MD;  Location: [facility];  Service: General;  Laterality: N/A;  . APPENDECTOMY  1975  . CORONARY STENT INTERVENTION N/A 09/09/2017   Procedure: CORONARY STENT INTERVENTION;  Surgeon: Wellington Hampshire, MD;  Location: [place] [facility];  Service:  Cardiovascular;  Laterality: N/A;  . CORONARY/GRAFT ACUTE MI REVASCULARIZATION N/A 07/18/2017   Procedure: Coronary/Graft Acute MI Revascularization;  Surgeon: Wellington Hampshire, MD;  Location: [place] [facility];  Service: Cardiovascular;  Laterality: N/A;  . HEMORRHOID SURGERY N/A 12/21/2014   Procedure: HEMORRHOIDECTOMY;  Surgeon: Christene Lye, MD;  Location: [facility];  Service:  General;  Laterality: N/A;  . INTRAVASCULAR ULTRASOUND/IVUS N/A 09/09/2017   Procedure: Intravascular Ultrasound/IVUS;  Surgeon: Wellington Hampshire, MD;  Location: [place] [facility];  Service: Cardiovascular;  Laterality: N/A;  . KNEE SURGERY Right age 32  . LEFT HEART CATH AND CORONARY ANGIOGRAPHY N/A 07/18/2017   Procedure: LEFT HEART CATH AND CORONARY ANGIOGRAPHY;  Surgeon: Wellington Hampshire, MD;  Location: [place] [facility];  Service: Cardiovascular;  Laterality: N/A;  . LEFT HEART CATH AND CORONARY ANGIOGRAPHY Left 08/19/2017   Procedure: LEFT HEART CATH AND CORONARY ANGIOGRAPHY;  Surgeon: Wellington Hampshire, MD;  Location: [place] [facility];  Service: Cardiovascular;  Laterality: Left;  . ROTATOR CUFF REPAIR Right 2013    Social History   Tobacco Use  . Smoking status: Former Smoker    Last attempt to quit: 07/24/2007    Years since quitting: 10.1  . Smokeless tobacco: Never Used  Substance Use Topics  . Alcohol use: Yes    Alcohol/week: 0.0 oz    Comment: OCC  . Drug use: No    Family History  Problem Relation Age of Onset  . Hypertension Mother   . Alzheimer's disease Mother   . Hypertension Father   . Alzheimer's disease Father     Medication list has been reviewed and updated.  Physical Examination: BP 118/78   Pulse 62   Resp 16   Ht 5\' 7"  (1.702 m)   Wt 189 lb 6.4 oz (85.9 kg)   SpO2 98%   BMI 29.66 kg/m   Physical Exam  Constitutional: He appears well-developed and well-nourished.  Cardiovascular: Normal rate, regular rhythm and normal heart sounds.  Pulmonary/Chest: Effort normal and breath sounds normal.  Musculoskeletal: He exhibits no edema.  Neurological: He is alert.  Skin: Skin is warm and dry.  Psychiatric: He has a normal mood and affect. His behavior is normal.  Nursing note and vitals reviewed.   Assessment and Plan:  1. Coronary artery disease of native artery of native heart with stable angina pectoris (HCC) Stable s/p stenting  last month on DAPT/BB/ARB/statin, d/c Lasix/K+/Mg++ given unclear indication and dry mouth, increase losartan to 25 mg daily, and change metoprolol tartrate to succinate 25 mg daily, to see Dr. Fletcher Anon tomorrow  2. Daytime sleepiness OSA may worsen cardiac outcomes - Ambulatory referral to Sleep Studies  3. Lumbar spondylosis with myelopathy Stable, consider PT if flares  4. Benign prostatic hyperplasia with nocturia Well controlled on Flomax  5. Hyperlipidemia, unspecified hyperlipidemia type On increased Lipitor, consider repeat lipids next visit  6. Overweight (BMI 25.0-29.9) Stable, attending cardiac rehab  7. HM Discussed no clear indication repeat colonoscopy, consider HIV/hep C testing next visit  Return in about 4 weeks (around 10/21/2017).  [name]. [facility]  09/23/2017

## 2017-09-23 NOTE — Patient Instructions (Signed)
Stop furosemide (Lasix), potassium supplement, and magnesium supplement. Increase losartan to 25 mg daily (new prescription sent). Change metoprolol to metoprolol XL 25 mg daily (new prescription sent). Referral for sleep study sent.

## 2017-09-24 ENCOUNTER — Encounter: Payer: Self-pay | Admitting: Physician Assistant

## 2017-09-24 ENCOUNTER — Ambulatory Visit (INDEPENDENT_AMBULATORY_CARE_PROVIDER_SITE_OTHER): Payer: 59 | Admitting: Physician Assistant

## 2017-09-24 ENCOUNTER — Encounter: Payer: Self-pay | Admitting: *Deleted

## 2017-09-24 VITALS — BP 126/70 | HR 60 | Ht 67.0 in | Wt 185.0 lb

## 2017-09-24 DIAGNOSIS — I2510 Atherosclerotic heart disease of native coronary artery without angina pectoris: Secondary | ICD-10-CM

## 2017-09-24 DIAGNOSIS — I255 Ischemic cardiomyopathy: Secondary | ICD-10-CM

## 2017-09-24 DIAGNOSIS — E785 Hyperlipidemia, unspecified: Secondary | ICD-10-CM | POA: Diagnosis not present

## 2017-09-24 DIAGNOSIS — Z79899 Other long term (current) drug therapy: Secondary | ICD-10-CM

## 2017-09-24 DIAGNOSIS — I5022 Chronic systolic (congestive) heart failure: Secondary | ICD-10-CM

## 2017-09-24 DIAGNOSIS — I480 Paroxysmal atrial fibrillation: Secondary | ICD-10-CM | POA: Diagnosis not present

## 2017-09-24 DIAGNOSIS — D72829 Elevated white blood cell count, unspecified: Secondary | ICD-10-CM

## 2017-09-24 DIAGNOSIS — I2119 ST elevation (STEMI) myocardial infarction involving other coronary artery of inferior wall: Secondary | ICD-10-CM

## 2017-09-24 MED ORDER — METOPROLOL SUCCINATE ER 25 MG PO TB24: 12.5000 mg | ORAL_TABLET | Freq: Every day | ORAL | 2 refills | Status: DC

## 2017-09-24 NOTE — Patient Instructions (Addendum)
Medication Instructions:  Your physician has recommended you make the following change in your medication:  1- CHANGE Metoprolol to 12.5 mg (1/2 tablet) by mouth once a day.   Labwork: Your physician recommends that you return for lab work in: TODAY (CBC, CMET, DIRECT LDL, LIPID.)   Testing/Procedures: none  Follow-Up: Your physician recommends that you schedule a follow-up appointment in: [place].   If you need a refill on your cardiac medications before your next appointment, please call your pharmacy.

## 2017-09-25 ENCOUNTER — Encounter: Payer: 59 | Admitting: *Deleted

## 2017-09-25 ENCOUNTER — Other Ambulatory Visit: Payer: Self-pay

## 2017-09-25 DIAGNOSIS — Z48812 Encounter for surgical aftercare following surgery on the circulatory system: Secondary | ICD-10-CM | POA: Diagnosis not present

## 2017-09-25 DIAGNOSIS — Z79899 Other long term (current) drug therapy: Secondary | ICD-10-CM

## 2017-09-25 DIAGNOSIS — Z955 Presence of coronary angioplasty implant and graft: Secondary | ICD-10-CM

## 2017-09-25 DIAGNOSIS — I213 ST elevation (STEMI) myocardial infarction of unspecified site: Secondary | ICD-10-CM

## 2017-09-25 DIAGNOSIS — E7800 Pure hypercholesterolemia, unspecified: Secondary | ICD-10-CM

## 2017-09-25 LAB — CBC WITH DIFFERENTIAL/PLATELET
BASOS: 1 %
Basophils Absolute: 0.1 10*3/uL (ref 0.0–0.2)
EOS (ABSOLUTE): 0.3 10*3/uL (ref 0.0–0.4)
Eos: 4 %
Hematocrit: 40.2 % (ref 37.5–51.0)
Hemoglobin: 13.5 g/dL (ref 13.0–17.7)
IMMATURE GRANS (ABS): 0.1 10*3/uL (ref 0.0–0.1)
IMMATURE GRANULOCYTES: 1 %
LYMPHS: 44 %
Lymphocytes Absolute: 3.4 10*3/uL — ABNORMAL HIGH (ref 0.7–3.1)
MCH: 27.8 pg (ref 26.6–33.0)
MCHC: 33.6 g/dL (ref 31.5–35.7)
MCV: 83 fL (ref 79–97)
MONOS ABS: 0.6 10*3/uL (ref 0.1–0.9)
Monocytes: 7 %
NEUTROS PCT: 43 %
Neutrophils Absolute: 3.4 10*3/uL (ref 1.4–7.0)
PLATELETS: 273 10*3/uL (ref 150–379)
RBC: 4.85 x10E6/uL (ref 4.14–5.80)
RDW: 14.9 % (ref 12.3–15.4)
WBC: 7.8 10*3/uL (ref 3.4–10.8)

## 2017-09-25 LAB — COMPREHENSIVE METABOLIC PANEL
A/G RATIO: 1.6 (ref 1.2–2.2)
ALBUMIN: 4.5 g/dL (ref 3.6–4.8)
ALT: 23 IU/L (ref 0–44)
AST: 24 IU/L (ref 0–40)
Alkaline Phosphatase: 78 IU/L (ref 39–117)
BUN / CREAT RATIO: 12 (ref 10–24)
BUN: 12 mg/dL (ref 8–27)
Bilirubin Total: 0.9 mg/dL (ref 0.0–1.2)
CO2: 21 mmol/L (ref 20–29)
CREATININE: 1.03 mg/dL (ref 0.76–1.27)
Calcium: 9.7 mg/dL (ref 8.6–10.2)
Chloride: 103 mmol/L (ref 96–106)
GFR calc Af Amer: 88 mL/min/{1.73_m2} (ref >59)
GFR calc non Af Amer: 76 mL/min/{1.73_m2} (ref >59)
GLOBULIN, TOTAL: 2.8 g/dL (ref 1.5–4.5)
Glucose: 95 mg/dL (ref 65–99)
POTASSIUM: 4.1 mmol/L (ref 3.5–5.2)
SODIUM: 141 mmol/L (ref 134–144)
Total Protein: 7.3 g/dL (ref 6.0–8.5)

## 2017-09-25 LAB — LIPID PANEL
Chol/HDL Ratio: 3.9 ratio (ref 0.0–5.0)
Cholesterol, Total: 164 mg/dL (ref 100–199)
HDL: 42 mg/dL (ref >39)
LDL CALC: 102 mg/dL — AB (ref 0–99)
TRIGLYCERIDES: 99 mg/dL (ref 0–149)
VLDL Cholesterol Cal: 20 mg/dL (ref 5–40)

## 2017-09-25 LAB — LDL CHOLESTEROL, DIRECT: LDL Direct: 116 mg/dL — ABNORMAL HIGH (ref 0–99)

## 2017-09-25 MED ORDER — EZETIMIBE 10 MG PO TABS: 10.0000 mg | ORAL_TABLET | Freq: Every day | ORAL | 3 refills | Status: DC

## 2017-09-25 NOTE — Progress Notes (Unsigned)
zetia 

## 2017-09-25 NOTE — Progress Notes (Signed)
Daily Session Note  Patient Details  Name: Todd Weaver MRN: [id number] Date of Birth: September 04, 1950 Referring Provider:     Cardiac Rehab from 08/01/2017 in [facility] Cardiac and Pulmonary Rehab  Referring Provider  Arida      Encounter Date: 09/25/2017  Check In: Session Check In - 09/25/17 1641      Check-In   Location  [facility]    Staff Present  Renita Papa, RN Vickki Hearing, BA, ACSM CEP, Exercise Physiologist;Carroll Enterkin, RN, BSN    Supervising physician immediately available to respond to emergencies  See telemetry face sheet for immediately available ER MD    Medication changes reported      No    Fall or balance concerns reported     No    Warm-up and Cool-down  Performed on first and last piece of equipment    Resistance Training Performed  Yes    VAD Patient?  No      Pain Assessment   Currently in Pain?  No/denies        Exercise Prescription Changes - 09/25/17 1300      Response to Exercise   Blood Pressure (Admit)  138/68    Blood Pressure (Exercise)  130/70    Blood Pressure (Exit)  120/78    Heart Rate (Admit)  60 bpm    Heart Rate (Exercise)  111 bpm    Heart Rate (Exit)  64 bpm    Symptoms  none    Duration  Continue with 45 min of aerobic exercise without signs/symptoms of physical distress.    Intensity  THRR unchanged      Progression   Progression  Continue to progress workloads to maintain intensity without signs/symptoms of physical distress.    Average METs  4.6      Resistance Training   Training Prescription  Yes    Weight  3 lb    Reps  10-15      Treadmill   MPH  2.8    Grade  1    Minutes  15    METs  3.5      REL-XR   Level  3    Speed  50    Minutes  15    METs  5.7       Social History   Tobacco Use  Smoking Status Former Smoker  . Last attempt to quit: 07/24/2007  . Years since quitting: 10.1  Smokeless Tobacco Never Used    Goals Met:  Independence with exercise equipment Exercise  tolerated well No report of cardiac concerns or symptoms Strength training completed today  Goals Unmet:  Not Applicable  Comments: Pt able to follow exercise prescription today without complaint.  Will continue to monitor for progression.    Dr. Emily Filbert is Medical Director for [place] and [facility].

## 2017-09-26 ENCOUNTER — Encounter: Payer: 59 | Admitting: *Deleted

## 2017-09-26 DIAGNOSIS — I213 ST elevation (STEMI) myocardial infarction of unspecified site: Secondary | ICD-10-CM

## 2017-09-26 DIAGNOSIS — Z48812 Encounter for surgical aftercare following surgery on the circulatory system: Secondary | ICD-10-CM | POA: Diagnosis not present

## 2017-09-26 DIAGNOSIS — Z955 Presence of coronary angioplasty implant and graft: Secondary | ICD-10-CM

## 2017-09-26 NOTE — Progress Notes (Signed)
Daily Session Note  Patient Details  Name: Todd Weaver MRN: [id number] Date of Birth: 1951/04/25 Referring Provider:     Cardiac Rehab from 08/01/2017 in [facility] Cardiac and Pulmonary Rehab  Referring Provider  Arida      Encounter Date: 09/26/2017  Check In: Session Check In - 09/26/17 1708      Check-In   Location  [facility]    Staff Present  Nyoka Cowden, RN, BSN, MA;Meredith Sherryll Burger, RN Moises Blood, BS, ACSM CEP, Exercise Physiologist    Supervising physician immediately available to respond to emergencies  See telemetry face sheet for immediately available ER MD    Medication changes reported      No    Fall or balance concerns reported     No    Warm-up and Cool-down  Performed on first and last piece of equipment    Resistance Training Performed  Yes    VAD Patient?  No      Pain Assessment   Currently in Pain?  No/denies          Social History   Tobacco Use  Smoking Status Former Smoker  . Last attempt to quit: 07/24/2007  . Years since quitting: 10.1  Smokeless Tobacco Never Used    Goals Met:  Independence with exercise equipment Exercise tolerated well Personal goals reviewed No report of cardiac concerns or symptoms Strength training completed today  Goals Unmet:  Not Applicable  Comments: Pt able to follow exercise prescription today without complaint.  Will continue to monitor for progression.    Dr. Emily Filbert is Medical Director for [place] and [facility].

## 2017-09-30 ENCOUNTER — Encounter: Payer: 59 | Admitting: *Deleted

## 2017-09-30 DIAGNOSIS — I213 ST elevation (STEMI) myocardial infarction of unspecified site: Secondary | ICD-10-CM

## 2017-09-30 DIAGNOSIS — Z48812 Encounter for surgical aftercare following surgery on the circulatory system: Secondary | ICD-10-CM | POA: Diagnosis not present

## 2017-09-30 DIAGNOSIS — Z955 Presence of coronary angioplasty implant and graft: Secondary | ICD-10-CM

## 2017-09-30 NOTE — Progress Notes (Signed)
Daily Session Note  Patient Details  Name: Todd Weaver MRN: [id number] Date of Birth: Dec 09, 1950 Referring Provider:     Cardiac Rehab from 08/01/2017 in [facility] Card[facility] Rehab  Referring Provider  Arida      Encounter Date: 09/30/2017  Check In: Session Check In - 09/30/17 1728      Check-In   Location  [facility]    Staff Present  Earlean Shawl, BS, ACSM CEP, Exercise Physiologist;Amanda Oletta Darter, BA, ACSM CEP, Exercise Physiologist;Meredith Sherryll Burger, RN BSN;Carroll Enterkin, RN, BSN    Supervising physician immediately available to respond to emergencies  See telemetry face sheet for immediately available ER MD    Medication changes reported      No    Fall or balance concerns reported     No    Warm-up and Cool-down  Performed on first and last piece of equipment    Resistance Training Performed  Yes    VAD Patient?  No      Pain Assessment   Currently in Pain?  No/denies    Multiple Pain Sites  No          Social History   Tobacco Use  Smoking Status Former Smoker  . Last attempt to quit: 07/24/2007  . Years since quitting: 10.1  Smokeless Tobacco Never Used    Goals Met:  Independence with exercise equipment Exercise tolerated well Personal goals reviewed No report of cardiac concerns or symptoms Strength training completed today  Goals Unmet:  Not Applicable  Comments: Pt able to follow exercise prescription today without complaint.  Will continue to monitor for progression.    Dr. Emily Filbert is Medical Director for [place] and [facility].

## 2017-10-01 ENCOUNTER — Ambulatory Visit: Payer: 59 | Admitting: Cardiovascular Disease

## 2017-10-02 DIAGNOSIS — I213 ST elevation (STEMI) myocardial infarction of unspecified site: Secondary | ICD-10-CM

## 2017-10-02 DIAGNOSIS — Z48812 Encounter for surgical aftercare following surgery on the circulatory system: Secondary | ICD-10-CM | POA: Diagnosis not present

## 2017-10-02 DIAGNOSIS — Z955 Presence of coronary angioplasty implant and graft: Secondary | ICD-10-CM

## 2017-10-02 NOTE — Progress Notes (Signed)
Daily Session Note  Patient Details  Name: [name] MRN: [id number] Date of Birth: [birth year] Referring Provider:     Cardiac Rehab from 08/01/2017 in [facility] Cardiac and Pulmonary Rehab  Referring Provider  Arida      Encounter Date: 10/02/2017  Check In: Session Check In - 10/02/17 1728      Check-In   Location  [facility]    Staff Present  Renita Papa, RN Vickki Hearing, BA, ACSM CEP, Exercise Physiologist;Carroll Enterkin, RN, BSN    Supervising physician immediately available to respond to emergencies  See telemetry face sheet for immediately available ER MD    Medication changes reported      No    Fall or balance concerns reported     No    Warm-up and Cool-down  Performed on first and last piece of equipment    Resistance Training Performed  Yes    VAD Patient?  No      Pain Assessment   Currently in Pain?  No/denies    Multiple Pain Sites  No          Social History   Tobacco Use  Smoking Status Former Smoker  . Last attempt to quit: 07/24/2007  . Years since quitting: 10.2  Smokeless Tobacco Never Used    Goals Met:  Independence with exercise equipment Exercise tolerated well No report of cardiac concerns or symptoms Strength training completed today  Goals Unmet:  Not Applicable  Comments: Pt able to follow exercise prescription today without complaint.  Will continue to monitor for progression.    Dr. Emily Filbert is Medical Director for [place] and [facility].

## 2017-10-03 DIAGNOSIS — I213 ST elevation (STEMI) myocardial infarction of unspecified site: Secondary | ICD-10-CM

## 2017-10-03 DIAGNOSIS — Z48812 Encounter for surgical aftercare following surgery on the circulatory system: Secondary | ICD-10-CM | POA: Diagnosis not present

## 2017-10-03 NOTE — Progress Notes (Signed)
Daily Session Note  Patient Details  Name: Todd Weaver MRN: [id number] Date of Birth: Nov 05, 1950 Referring Provider:     Cardiac Rehab from 08/01/2017 in [facility] Cardiac and Pulmonary Rehab  Referring Provider  Arida      Encounter Date: 10/03/2017  Check In: Session Check In - 10/03/17 1633      Check-In   Location  [facility]    Staff Present  Earlean Shawl, BS, ACSM CEP, Exercise Physiologist;Meredith Sherryll Burger, RN BSN;Sherman Donaldson Flavia Shipper    Supervising physician immediately available to respond to emergencies  See telemetry face sheet for immediately available ER MD    Medication changes reported      No    Fall or balance concerns reported     No    Tobacco Cessation  No Change    Warm-up and Cool-down  Performed on first and last piece of equipment    Resistance Training Performed  Yes    VAD Patient?  No      Pain Assessment   Currently in Pain?  No/denies          Social History   Tobacco Use  Smoking Status Former Smoker  . Last attempt to quit: 07/24/2007  . Years since quitting: 10.2  Smokeless Tobacco Never Used    Goals Met:  Independence with exercise equipment Exercise tolerated well No report of cardiac concerns or symptoms Strength training completed today  Goals Unmet:  Not Applicable  Comments: Pt able to follow exercise prescription today without complaint.  Will continue to monitor for progression.   Dr. Emily Filbert is Medical Director for [place] and [facility].

## 2017-10-07 DIAGNOSIS — Z955 Presence of coronary angioplasty implant and graft: Secondary | ICD-10-CM

## 2017-10-07 DIAGNOSIS — Z48812 Encounter for surgical aftercare following surgery on the circulatory system: Secondary | ICD-10-CM | POA: Diagnosis not present

## 2017-10-07 DIAGNOSIS — I213 ST elevation (STEMI) myocardial infarction of unspecified site: Secondary | ICD-10-CM

## 2017-10-07 NOTE — Progress Notes (Signed)
Daily Session Note  Patient Details  Name: Todd Weaver MRN: [id number] Date of Birth: [birth year] Referring Provider:     Cardiac Rehab from 08/01/2017 in [facility] Cardiac and Pulmonary Rehab  Referring Provider  Arida      Encounter Date: 10/07/2017  Check In: Session Check In - 10/07/17 1719      Check-In   Location  [facility]    Staff Present  Renita Papa, RN Moises Blood, BS, ACSM CEP, Exercise Physiologist;Amanda Oletta Darter, [place], ACSM CEP, Exercise Physiologist;Carroll Enterkin, RN, BSN    Supervising physician immediately available to respond to emergencies  See telemetry face sheet for immediately available ER MD    Medication changes reported      No    Fall or balance concerns reported     No    Warm-up and Cool-down  Performed on first and last piece of equipment    Resistance Training Performed  Yes    VAD Patient?  No      Pain Assessment   Currently in Pain?  No/denies    Multiple Pain Sites  No          Social History   Tobacco Use  Smoking Status Former Smoker  . Last attempt to quit: 07/24/2007  . Years since quitting: 10.2  Smokeless Tobacco Never Used    Goals Met:  Independence with exercise equipment Exercise tolerated well No report of cardiac concerns or symptoms Strength training completed today  Goals Unmet:  Not Applicable  Comments: Pt able to follow exercise prescription today without complaint.  Will continue to monitor for progression.    Dr. Emily Filbert is Medical Director for [place] and [facility].

## 2017-10-09 ENCOUNTER — Encounter: Payer: Self-pay | Admitting: *Deleted

## 2017-10-09 ENCOUNTER — Encounter: Payer: 59 | Admitting: *Deleted

## 2017-10-09 DIAGNOSIS — I213 ST elevation (STEMI) myocardial infarction of unspecified site: Secondary | ICD-10-CM

## 2017-10-09 DIAGNOSIS — Z48812 Encounter for surgical aftercare following surgery on the circulatory system: Secondary | ICD-10-CM | POA: Diagnosis not present

## 2017-10-09 DIAGNOSIS — Z955 Presence of coronary angioplasty implant and graft: Secondary | ICD-10-CM

## 2017-10-09 NOTE — Progress Notes (Signed)
Daily Session Note  Patient Details  Name: Todd Weaver MRN: [id number] Date of Birth: March 31, 1951 Referring Provider:     Cardiac Rehab from 08/01/2017 in [facility] Cardiac and Pulmonary Rehab  Referring Provider  Arida      Encounter Date: 10/09/2017  Check In: Session Check In - 10/09/17 1642      Check-In   Location  [facility]    Staff Present  Renita Papa, RN Vickki Hearing, BA, ACSM CEP, Exercise Physiologist;Samy Ryner, RN, BSN    Supervising physician immediately available to respond to emergencies  See telemetry face sheet for immediately available ER MD    Medication changes reported      No    Fall or balance concerns reported     No    Tobacco Cessation  No Change    Warm-up and Cool-down  Performed on first and last piece of equipment    Resistance Training Performed  Yes    VAD Patient?  No      Pain Assessment   Currently in Pain?  No/denies        Exercise Prescription Changes - 10/09/17 1400      Response to Exercise   Blood Pressure (Admit)  134/64    Blood Pressure (Exercise)  146/64    Blood Pressure (Exit)  142/60    Heart Rate (Admit)  69 bpm    Heart Rate (Exercise)  111 bpm    Heart Rate (Exit)  67 bpm    Rating of Perceived Exertion (Exercise)  13    Symptoms  none    Duration  Continue with 45 min of aerobic exercise without signs/symptoms of physical distress.    Intensity  THRR unchanged      Progression   Progression  Continue to progress workloads to maintain intensity without signs/symptoms of physical distress.    Average METs  4.9      Resistance Training   Training Prescription  Yes    Weight  3 lb    Reps  10-15      Treadmill   MPH  2.8    Grade  1    Minutes  15    METs  3.5      NuStep   Level  4    SPM  80    Minutes  15    METs  4.5      Home Exercise Plan   Plans to continue exercise at  [facility] (comment)    Frequency  Add 1 additional day to program exercise sessions.     Initial Home Exercises Provided  09/26/17       Social History   Tobacco Use  Smoking Status Former Smoker  . Last attempt to quit: 07/24/2007  . Years since quitting: 10.2  Smokeless Tobacco Never Used    Goals Met:  Proper associated with RPD/PD & O2 Sat Exercise tolerated well No report of cardiac concerns or symptoms Strength training completed today  Goals Unmet:  Not Applicable  Comments:     Dr. Emily Filbert is Medical Director for [place] and [facility].

## 2017-10-10 DIAGNOSIS — I213 ST elevation (STEMI) myocardial infarction of unspecified site: Secondary | ICD-10-CM

## 2017-10-10 DIAGNOSIS — Z48812 Encounter for surgical aftercare following surgery on the circulatory system: Secondary | ICD-10-CM | POA: Diagnosis not present

## 2017-10-10 NOTE — Progress Notes (Signed)
Daily Session Note  Patient Details  Name: Todd Weaver MRN: [id number] Date of Birth: June 26, 1951 Referring Provider:     Cardiac Rehab from 08/01/2017 in [facility] Cardiac and Pulmonary Rehab  Referring Provider  Arida      Encounter Date: 10/10/2017  Check In: Session Check In - 10/10/17 1651      Check-In   Location  [facility]    Staff Present  Earlean Shawl, BS, ACSM CEP, Exercise Physiologist;Meredith Sherryll Burger, RN BSN;Jodi Criscuolo Flavia Shipper    Supervising physician immediately available to respond to emergencies  See telemetry face sheet for immediately available ER MD    Medication changes reported      No    Fall or balance concerns reported     No    Tobacco Cessation  No Change    Warm-up and Cool-down  Performed on first and last piece of equipment    Resistance Training Performed  Yes    VAD Patient?  No      Pain Assessment   Currently in Pain?  No/denies          Social History   Tobacco Use  Smoking Status Former Smoker  . Last attempt to quit: 07/24/2007  . Years since quitting: 10.2  Smokeless Tobacco Never Used    Goals Met:  Independence with exercise equipment Exercise tolerated well No report of cardiac concerns or symptoms Strength training completed today  Goals Unmet:  Not Applicable  Comments: Pt able to follow exercise prescription today without complaint.  Will continue to monitor for progression.   Dr. Emily Filbert is Medical Director for [place] and [facility].

## 2017-10-11 ENCOUNTER — Telehealth: Payer: Self-pay | Admitting: Cardiovascular Disease

## 2017-10-11 NOTE — Telephone Encounter (Signed)
If he cannot tolerate Brilinta, we can switch him to Plavix 75 mg once daily with a 300 mg loading dose.

## 2017-10-11 NOTE — Telephone Encounter (Signed)
Pt states the Brilinta is making him cough. Pt states when he is in rehab and they "pull the patches off, my skin bleeds", he thinks it may be from a medication he is taking.

## 2017-10-11 NOTE — Telephone Encounter (Signed)
Called patient. He thinks he developed at cough around the time he started the Scanlon. Cough is mainly at night when he lays down. Discussed with patient the importance of taking Brilinta for 1 year after having the stent placed. He said he could deal with the cough if he had to.  Concerning the areas on his skin. He said the area he puts the electrodes during rehab are sensitive from the frequent application. I encouraged patient to rotate the areas if possible and discuss with rehab nurse to come up with a plan. Advised he could use a little Neosporin on them if needed.  Routing to Dr Fletcher Anon for review and any further recommendations.

## 2017-10-14 ENCOUNTER — Encounter: Payer: 59 | Admitting: *Deleted

## 2017-10-14 DIAGNOSIS — Z48812 Encounter for surgical aftercare following surgery on the circulatory system: Secondary | ICD-10-CM | POA: Diagnosis not present

## 2017-10-14 DIAGNOSIS — I213 ST elevation (STEMI) myocardial infarction of unspecified site: Secondary | ICD-10-CM

## 2017-10-14 DIAGNOSIS — Z955 Presence of coronary angioplasty implant and graft: Secondary | ICD-10-CM

## 2017-10-14 MED ORDER — CLOPIDOGREL BISULFATE 75 MG PO TABS: ORAL_TABLET | ORAL | 3 refills | Status: DC

## 2017-10-14 NOTE — Telephone Encounter (Signed)
Called patient. He was agreeable to make the switch.Kary Kos discontinued and Rx for Plavix sent to pharmacy. Patient verbalized understanding to take 4 tablets (300 mg) the first day and then 1 tablet (75 mg) once a day thereafter.

## 2017-10-14 NOTE — Progress Notes (Signed)
Daily Session Note  Patient Details  Name: Todd Weaver MRN: [id number] Date of Birth: 1950-12-28 Referring Provider:     Cardiac Rehab from 08/01/2017 in [facility]  Referring Provider  Arida      Encounter Date: 10/14/2017  Check In: Session Check In - 10/14/17 1654      Check-In   Location  [facility]    Staff Present  Earlean Shawl, BS, ACSM CEP, Exercise Physiologist;Amanda Oletta Darter, BA, ACSM CEP, Exercise Physiologist;Meredith Sherryll Burger, RN BSN;Carroll Enterkin, RN, BSN    Supervising physician immediately available to respond to emergencies  See telemetry face sheet for immediately available ER MD    Medication changes reported      No    Fall or balance concerns reported     No    Warm-up and Cool-down  Performed on first and last piece of equipment    Resistance Training Performed  Yes    VAD Patient?  No      Pain Assessment   Currently in Pain?  No/denies    Multiple Pain Sites  No          Social History   Tobacco Use  Smoking Status Former Smoker  . Last attempt to quit: 07/24/2007  . Years since quitting: 10.2  Smokeless Tobacco Never Used    Goals Met:  Independence with exercise equipment Exercise tolerated well No report of cardiac concerns or symptoms Strength training completed today  Goals Unmet:  Not Applicable  Comments: Pt able to follow exercise prescription today without complaint.  Will continue to monitor for progression.    Dr. Emily Filbert is Medical Director for [place] and [facility].

## 2017-10-16 ENCOUNTER — Encounter: Payer: Self-pay | Admitting: *Deleted

## 2017-10-16 DIAGNOSIS — I213 ST elevation (STEMI) myocardial infarction of unspecified site: Secondary | ICD-10-CM

## 2017-10-16 DIAGNOSIS — Z955 Presence of coronary angioplasty implant and graft: Secondary | ICD-10-CM

## 2017-10-16 DIAGNOSIS — Z48812 Encounter for surgical aftercare following surgery on the circulatory system: Secondary | ICD-10-CM | POA: Diagnosis not present

## 2017-10-16 NOTE — Progress Notes (Signed)
Cardiac Individual Treatment Plan  Patient Details  Name: Todd Weaver MRN: [id number] Date of Birth: 08-16-1950 Referring Provider:     Cardiac Rehab from 08/01/2017 in [facility] Cardiac and Pulmonary Rehab  Referring Provider  Arida      Initial Encounter Date:    Cardiac Rehab from 08/01/2017 in [facility] Cardiac and Pulmonary Rehab  Date  08/01/17  Referring Provider  Fletcher Anon      Visit Diagnosis: ST elevation myocardial infarction (STEMI), unspecified artery [facility])  Status post coronary artery stent placement  Patient's Home Medications on Admission:  Current Outpatient Medications:  .  aspirin EC 81 MG tablet, Take 81 mg by mouth daily. , Disp: , Rfl:  .  atorvastatin (LIPITOR) 80 MG tablet, Take 1 tablet (80 mg total) by mouth daily at 6 PM., Disp: 90 tablet, Rfl: 3 .  clopidogrel (PLAVIX) 75 MG tablet, Take 300 mg (4 tablets) by mouth the first day, then take 75 mg (1 tablet) by mouth once a day thereafter., Disp: 90 tablet, Rfl: 3 .  ezetimibe (ZETIA) 10 MG tablet, Take 1 tablet (10 mg total) by mouth daily., Disp: 90 tablet, Rfl: 3 .  losartan (COZAAR) 25 MG tablet, Take 1 tablet (25 mg total) by mouth daily., Disp: 30 tablet, Rfl: 2 .  metoprolol succinate (TOPROL-XL) 25 MG 24 hr tablet, Take 0.5 tablets (12.5 mg total) by mouth daily., Disp: 30 tablet, Rfl: 2 .  nitroGLYCERIN (NITROSTAT) 0.4 MG SL tablet, Place 1 tablet (0.4 mg total) under the tongue every 5 (five) minutes as needed for chest pain., Disp: 30 tablet, Rfl: 0 .  tamsulosin (FLOMAX) 0.4 MG CAPS capsule, Take 1 capsule (0.4 mg total) by mouth daily., Disp: 90 capsule, Rfl: 3  Past Medical History: Past Medical History:  Diagnosis Date  . Acute ST elevation myocardial infarction (STEMI) of inferior wall ([place]) 07/18/2017  . CAD (coronary artery disease)    a. nuc stress test 9/17: small defect of mild severity in the apex location, partially reversible and suspected to be due to artifact given intense GI uptake, EF  55-65%, low risk study; b. inferior STEMI 07/18/17  . GERD (gastroesophageal reflux disease)   . Hemorrhoid   . Hyperlipidemia   . Hypertension   . Stroke [facility]) 2011   "2 MINI STROKES"    Tobacco Use: Social History   Tobacco Use  Smoking Status Former Smoker  . Last attempt to quit: 07/24/2007  . Years since quitting: 10.2  Smokeless Tobacco Never Used    Labs: Recent Review Scientist, physiological    Labs for ITP Cardiac and Pulmonary Rehab Latest Ref Rng & Units 06/19/2017 07/17/2017 07/18/2017 09/24/2017   Cholestrol 100 - 199 mg/dL 235(H) 223(H) 216(H) 164   LDLCALC 0 - 99 mg/dL 155(H) 130(H) 129(H) 102(H)   LDLDIRECT 0 - 99 mg/dL - - - 116(H)   HDL >39 mg/dL 61 73 66 42   Trlycerides 0 - 149 mg/dL 93 99 106 99   Hemoglobin A1c 4.8 - 5.6 % - - 5.8(H) -       Exercise Target Goals:    Exercise Program Goal: Individual exercise prescription set using results from initial 6 min walk test and THRR while considering  patient's activity barriers and safety.   Exercise Prescription Goal: Initial exercise prescription builds to 30-45 minutes a day of aerobic activity, 2-3 days per week.  Home exercise guidelines will be given to patient during program as part of exercise prescription that the participant will acknowledge.  Activity Barriers & Risk Stratification: Activity Barriers & Cardiac Risk Stratification - 08/01/17 1415      Activity Barriers & Cardiac Risk Stratification   Activity Barriers  Back Problems;Shortness of Breath back surgery May 2018    Cardiac Risk Stratification  Moderate       6 Minute Walk: 6 Minute Walk    Row Name 08/01/17 1446         6 Minute Walk   Distance  1475 feet     Walk Time  6 minutes     # of Rest Breaks  0     MPH  2.8     METS  3.64     RPE  11     Perceived Dyspnea   0     VO2 Peak  12.73     Symptoms  No     Resting HR  59 bpm     Resting BP  118/66     Resting Oxygen Saturation   92 %     Exercise Oxygen Saturation   during 6 min walk  99 %     Max Ex. HR  114 bpm     Max Ex. BP  128/66     2 Minute Post BP  112/66        Oxygen Initial Assessment:   Oxygen Re-Evaluation:   Oxygen Discharge (Final Oxygen Re-Evaluation):   Initial Exercise Prescription: Initial Exercise Prescription - 08/01/17 1400      Date of Initial Exercise RX and Referring Provider   Date  08/01/17    Referring Provider  Arida      Treadmill   MPH  2.8    Grade  1    Minutes  15    METs  3.5      Recumbant Bike   Level  3    RPM  60    Watts  40    Minutes  15    METs  3.5      NuStep   Level  3    SPM  80    Minutes  15    METs  3.5      REL-XR   Level  3    Speed  50    Minutes  15    METs  3.5      Prescription Details   Frequency (times per week)  3    Duration  Progress to 45 minutes of aerobic exercise without signs/symptoms of physical distress      Intensity   THRR 40-80% of Max Heartrate  97-137    Ratings of Perceived Exertion  11-13    Perceived Dyspnea  0-4      Resistance Training   Training Prescription  Yes    Weight  3 lb    Reps  10-15       Perform Capillary Blood Glucose checks as needed.  Exercise Prescription Changes: Exercise Prescription Changes    Row Name 08/01/17 1400 09/25/17 1300 09/26/17 1700 10/09/17 1400       Response to Exercise   Blood Pressure (Admit)  118/66  138/68  -  134/64    Blood Pressure (Exercise)  128/66  130/70  -  146/64    Blood Pressure (Exit)  -  120/78  -  142/60    Heart Rate (Admit)  59 bpm  60 bpm  -  69 bpm    Heart Rate (Exercise)  114 bpm  111 bpm  -  111 bpm    Heart Rate (Exit)  57 bpm  64 bpm  -  67 bpm    Oxygen Saturation (Admit)  92 %  -  -  -    Oxygen Saturation (Exit)  99 %  -  -  -    Rating of Perceived Exertion (Exercise)  11  -  -  13    Symptoms  -  none  -  none    Duration  -  Continue with 45 min of aerobic exercise without signs/symptoms of physical distress.  -  Continue with 45 min of aerobic exercise  without signs/symptoms of physical distress.    Intensity  -  THRR unchanged  -  THRR unchanged      Progression   Progression  -  Continue to progress workloads to maintain intensity without signs/symptoms of physical distress.  Continue to progress workloads to maintain intensity without signs/symptoms of physical distress.  Continue to progress workloads to maintain intensity without signs/symptoms of physical distress.    Average METs  -  4.6  4.6  4.9      Resistance Training   Training Prescription  -  Yes  Yes  Yes    Weight  -  3 lb  3 lb  3 lb    Reps  -  10-15  10-15  10-15      Treadmill   MPH  -  2.8  2.8  2.8    Grade  -  '1  1  1    '$ Minutes  -  '15  15  15    '$ METs  -  3.5  3.5  3.5      NuStep   Level  -  -  -  4    SPM  -  -  -  80    Minutes  -  -  -  15    METs  -  -  -  4.5      REL-XR   Level  -  3  3  -    Speed  -  50  50  -    Minutes  -  15  15  -    METs  -  5.7  5.7  -      Home Exercise Plan   Plans to continue exercise at  -  -  [employer] (comment)  Forensic scientist (comment)    Frequency  -  -  Add 1 additional day to program exercise sessions.  Add 1 additional day to program exercise sessions.    Initial Home Exercises Provided  -  -  09/26/17  09/26/17       Exercise Comments: Exercise Comments    Row Name 08/07/17 1725           Exercise Comments  First full day of exercise!  Patient was oriented to gym and equipment including functions, settings, policies, and procedures.  Patient's individual exercise prescription and treatment plan were reviewed.  All starting workloads were established based on the results of the 6 minute walk test done at initial orientation visit.  The plan for exercise progression was also introduced and progression will be customized based on patient's performance and goals          Exercise Goals and Review: Exercise Goals    Row Name 08/01/17 1443             Exercise Goals  Increase Physical  Activity  Yes       Intervention  Provide advice, education, support and counseling about physical activity/exercise needs.;Develop an individualized exercise prescription for aerobic and resistive training based on initial evaluation findings, risk stratification, comorbidities and participant's personal goals.       Expected Outcomes  Achievement of increased cardiorespiratory fitness and enhanced flexibility, muscular endurance and strength shown through measurements of functional capacity and personal statement of participant.       Increase Strength and Stamina  Yes       Intervention  Provide advice, education, support and counseling about physical activity/exercise needs.;Develop an individualized exercise prescription for aerobic and resistive training based on initial evaluation findings, risk stratification, comorbidities and participant's personal goals.       Expected Outcomes  Achievement of increased cardiorespiratory fitness and enhanced flexibility, muscular endurance and strength shown through measurements of functional capacity and personal statement of participant.       Able to understand and use rate of perceived exertion (RPE) scale  Yes       Intervention  Provide education and explanation on how to use RPE scale       Expected Outcomes  Short Term: Able to use RPE daily in rehab to express subjective intensity level;Long Term:  Able to use RPE to guide intensity level when exercising independently       Able to understand and use Dyspnea scale  Yes       Intervention  Provide education and explanation on how to use Dyspnea scale       Expected Outcomes  Short Term: Able to use Dyspnea scale daily in rehab to express subjective sense of shortness of breath during exertion;Long Term: Able to use Dyspnea scale to guide intensity level when exercising independently       Knowledge and understanding of Target Heart Rate Range (THRR)  Yes       Intervention  Provide education and  explanation of THRR including how the numbers were predicted and where they are located for reference       Expected Outcomes  Short Term: Able to state/look up THRR;Short Term: Able to use daily as guideline for intensity in rehab;Long Term: Able to use THRR to govern intensity when exercising independently       Able to check pulse independently  Yes       Intervention  Provide education and demonstration on how to check pulse in carotid and radial arteries.;Review the importance of being able to check your own pulse for safety during independent exercise       Expected Outcomes  Short Term: Able to explain why pulse checking is important during independent exercise;Long Term: Able to check pulse independently and accurately       Understanding of Exercise Prescription  Yes       Intervention  Provide education, explanation, and written materials on patient's individual exercise prescription       Expected Outcomes  Short Term: Able to explain program exercise prescription;Long Term: Able to explain home exercise prescription to exercise independently          Exercise Goals Re-Evaluation : Exercise Goals Re-Evaluation    Row Name 08/07/17 1725 09/25/17 1334 09/26/17 1709 10/09/17 1441       Exercise Goal Re-Evaluation   Exercise Goals Review  Able to understand and use rate of perceived exertion (RPE) scale;Knowledge and understanding of Target Heart Rate Range (THRR);Understanding of Exercise Prescription  Increase Physical Activity;Able to  understand and use rate of perceived exertion (RPE) scale;Knowledge and understanding of Target Heart Rate Range (THRR);Increase Strength and Stamina  Increase Physical Activity;Understanding of Exercise Prescription;Increase Strength and Stamina;Knowledge and understanding of Target Heart Rate Range (THRR);Able to understand and use rate of perceived exertion (RPE) scale;Able to check pulse independently  Increase Physical Activity;Increase Strength and  Stamina;Able to understand and use rate of perceived exertion (RPE) scale;Knowledge and understanding of Target Heart Rate Range (THRR);Able to understand and use Dyspnea scale    Comments  Reviewed RPE scale, THR and program prescription with pt today.  Pt voiced understanding and was given a copy of goals to take home.   Pt is tolerating exercise well.  He is reaching THR range.  Staff will continue to monitor.  Home excerise guidelines reviewed with patient, handout given and signed  Tarrance is making great progress and his average MET level is 4.9.  He has increased the difficulty of his strength exercise by doing more advanced options for some exercises.  Staff will work with Simona Huh to add intervals to his program    Expected Outcomes  Short: Use RPE daily to regulate intensity.  Long: Follow program prescription in THR.  Short - Pt will attend classes regularly Long - pt will continue to increase MET level  Short: Aero will add one extra day of exercise outside the programLong: become independent with exercise.   Short -  Cuthbert will add  intervals to the XR and NS.  Long - Johndaniel will continue to increase MET level       Discharge Exercise Prescription (Final Exercise Prescription Changes): Exercise Prescription Changes - 10/09/17 1400      Response to Exercise   Blood Pressure (Admit)  134/64    Blood Pressure (Exercise)  146/64    Blood Pressure (Exit)  142/60    Heart Rate (Admit)  69 bpm    Heart Rate (Exercise)  111 bpm    Heart Rate (Exit)  67 bpm    Rating of Perceived Exertion (Exercise)  13    Symptoms  none    Duration  Continue with 45 min of aerobic exercise without signs/symptoms of physical distress.    Intensity  THRR unchanged      Progression   Progression  Continue to progress workloads to maintain intensity without signs/symptoms of physical distress.    Average METs  4.9      Resistance Training   Training Prescription  Yes    Weight  3 lb    Reps  10-15       Treadmill   MPH  2.8    Grade  1    Minutes  15    METs  3.5      NuStep   Level  4    SPM  80    Minutes  15    METs  4.5      Home Exercise Plan   Plans to continue exercise at  [facility] (comment)    Frequency  Add 1 additional day to program exercise sessions.    Initial Home Exercises Provided  09/26/17       Nutrition:  Target Goals: Understanding of nutrition guidelines, daily intake of sodium '1500mg'$ , cholesterol '200mg'$ , calories 30% from fat and 7% or less from saturated fats, daily to have 5 or more servings of fruits and vegetables.  Biometrics: Pre Biometrics - 08/01/17 1442      Pre Biometrics   Height  5' 7.5" (  1.715 m)    Weight  179 lb 3.2 oz (81.3 kg)    Waist Circumference  38.25 inches    Hip Circumference  39.5 inches    Waist to Hip Ratio  0.97 %    BMI (Calculated)  27.64    Single Leg Stand  30 seconds        Nutrition Therapy Plan and Nutrition Goals: Nutrition Therapy & Goals - 08/01/17 1401      Intervention Plan   Intervention  Prescribe, educate and counsel regarding individualized specific dietary modifications aiming towards targeted core components such as weight, hypertension, lipid management, diabetes, heart failure and other comorbidities.;Nutrition handout(s) given to patient.    Expected Outcomes  Short Term Goal: Understand basic principles of dietary content, such as calories, fat, sodium, cholesterol and nutrients.;Short Term Goal: A plan has been developed with personal nutrition goals set during dietitian appointment.;Long Term Goal: Adherence to prescribed nutrition plan.       Nutrition Assessments: Nutrition Assessments - 08/01/17 1402      MEDFICTS Scores   Pre Score  16       Nutrition Goals Re-Evaluation: Nutrition Goals Re-Evaluation    Row Name 09/30/17 1629             Goals   Comment  Keanan would like to meet with RD.  Staff will schedule.  He has made some changes including less red meat, and  more vegetables.       Expected Outcome  Short - Colm will meet with RD to get a more comprehensive plan Long - Ilian will reach weight goals and keep heart healthy diet          Nutrition Goals Discharge (Final Nutrition Goals Re-Evaluation): Nutrition Goals Re-Evaluation - 09/30/17 1629      Goals   Comment  Arish would like to meet with RD.  Staff will schedule.  He has made some changes including less red meat, and more vegetables.    Expected Outcome  Short - Austan will meet with RD to get a more comprehensive plan Long - Romero will reach weight goals and keep heart healthy diet       Psychosocial: Target Goals: Acknowledge presence or absence of significant depression and/or stress, maximize coping skills, provide positive support system. Participant is able to verbalize types and ability to use techniques and skills needed for reducing stress and depression.   Initial Review & Psychosocial Screening: Initial Psych Review & Screening - 08/01/17 1405      Initial Review   Current issues with  Current Sleep Concerns;Current Stress Concerns    Comments  wakes up at 3 am every morning after his heart attack, stressed processing that he had a heart attack and what that means for his future. He wants to learn all he can about cardiac health.       Family Dynamics   Good Support System?  Yes wife, friends      Screening Interventions   Interventions  Yes;Encouraged to exercise    Expected Outcomes  Short Term goal: Utilizing psychosocial counselor, staff and physician to assist with identification of specific Stressors or current issues interfering with healing process. Setting desired goal for each stressor or current issue identified.;Long Term Goal: Stressors or current issues are controlled or eliminated.;Short Term goal: Identification and review with participant of any Quality of Life or Depression concerns found by scoring the questionnaire.;Long Term goal: The participant  improves quality of Life and PHQ9 Scores as  seen by post scores and/or verbalization of changes       Quality of Life Scores:  Quality of Life - 08/01/17 1412      Quality of Life Scores   Health/Function Pre  20.73 %    Socioeconomic Pre  20.88 %    Psych/Spiritual Pre  21 %    Family Pre  21 %    GLOBAL Pre  20.86 %      Scores of 19 and below usually indicate a poorer quality of life in these areas.  A difference of  2-3 points is a clinically meaningful difference.  A difference of 2-3 points in the total score of the Quality of Life Index has been associated with significant improvement in overall quality of life, self-image, physical symptoms, and general health in studies assessing change in quality of life.  PHQ-9: Recent Review Flowsheet Data    Depression screen [facility] 2/9 08/01/2017 06/19/2017   Decreased Interest 0 0   Down, Depressed, Hopeless 0 0   PHQ - 2 Score 0 0   Altered sleeping 1 -   Tired, decreased energy 1 -   Change in appetite 0 -   Feeling bad or failure about yourself  0 -   Trouble concentrating 0 -   Moving slowly or fidgety/restless 0 -   Suicidal thoughts 0 -   PHQ-9 Score 2 -   Difficult doing work/chores Not difficult at all -     Interpretation of Total Score  Total Score Depression Severity:  1-4 = Minimal depression, 5-9 = Mild depression, 10-14 = Moderate depression, 15-19 = Moderately severe depression, 20-27 = Severe depression   Psychosocial Evaluation and Intervention: Psychosocial Evaluation - 08/07/17 1653      Psychosocial Evaluation & Interventions   Interventions  Encouraged to exercise with the program and follow exercise prescription    Comments  Counselor met with Mr. Denardo Haxtun) today for initial psychosocial evaluation.  He is a 67 year old who had a heart attack and 2 stents inserted on 12/28 (several weeks ago).  He has a strong support system with a spouse of 24 years; (2) daughters, a brother and sister who live close  by and Deon is actively involved in his local church.  He had back surgery this past May and reports it was successful and less painful.  Ashtyn states he only sleeps 4-5 hours/night and that has been typical for the past year or so.  He has a good appetite.  Raji denies a history of depression or anxiety or any current symptoms, and states he is typically in a positive mood most of the time.  He has minimal stress in his life other than his health concerns.  Raye has goals to get back to "100%" with his heart - increasing in stamina and strength.  Staff will follow him throughout the course of this program.     Expected Outcomes  Jefferey will benefit from consistent exercise to achieve his stated goals.  The educational and psychoeducational components of this program will be helpful in learning more about his condition and ways to cope more positively with it.      Continue Psychosocial Services   Follow up required by staff       Psychosocial Re-Evaluation: Psychosocial Re-Evaluation    [place] Name 09/30/17 1628             Psychosocial Re-Evaluation   Current issues with  None Identified  Comments  No new stress identified.       Interventions  Encouraged to attend Cardiac Rehabilitation for the exercise       Continue Psychosocial Services   Follow up required by staff          Psychosocial Discharge (Final Psychosocial Re-Evaluation): Psychosocial Re-Evaluation - 09/30/17 1628      Psychosocial Re-Evaluation   Current issues with  None Identified    Comments  No new stress identified.    Interventions  Encouraged to attend Cardiac Rehabilitation for the exercise    Continue Psychosocial Services   Follow up required by staff       Vocational Rehabilitation: Provide vocational rehab assistance to qualifying candidates.   Vocational Rehab Evaluation & Intervention: Vocational Rehab - 08/01/17 1413      Initial Vocational Rehab Evaluation & Intervention   Assessment  shows need for Vocational Rehabilitation  No       Education: Education Goals: Education classes will be provided on a variety of topics geared toward better understanding of heart health and risk factor modification. Participant will state understanding/return demonstration of topics presented as noted by education test scores.  Learning Barriers/Preferences: Learning Barriers/Preferences - 08/01/17 1412      Learning Barriers/Preferences   Learning Barriers  None    Learning Preferences  Group Instruction       Education Topics:  AED/CPR: - Group verbal and written instruction with the use of models to demonstrate the basic use of the AED with the basic ABC's of resuscitation.   General Nutrition Guidelines/Fats and Fiber: -Group instruction provided by verbal, written material, models and posters to present the general guidelines for heart healthy nutrition. Gives an explanation and review of dietary fats and fiber.   Cardiac Rehab from 10/14/2017 in [facility] Cardiac and Pulmonary Rehab  Date  10/14/17  Educator  PI  Instruction Review Code  1- Verbalizes Understanding      Controlling Sodium/Reading Food Labels: -Group verbal and written material supporting the discussion of sodium use in heart healthy nutrition. Review and explanation with models, verbal and written materials for utilization of the food label.   Exercise Physiology & General Exercise Guidelines: - Group verbal and written instruction with models to review the exercise physiology of the cardiovascular system and associated critical values. Provides general exercise guidelines with specific guidelines to those with heart or lung disease.    Aerobic Exercise & Resistance Training: - Gives group verbal and written instruction on the various components of exercise. Focuses on aerobic and resistive training programs and the benefits of this training and how to safely progress through these programs..   Cardiac  Rehab from 10/14/2017 in [facility] Cardiac and Pulmonary Rehab  Date  09/16/17  Educator  AS  Instruction Review Code  1- Verbalizes Understanding      Flexibility, Balance, Mind/Body Relaxation: Provides group verbal/written instruction on the benefits of flexibility and balance training, including mind/body exercise modes such as yoga, pilates and tai chi.  Demonstration and skill practice provided.   Cardiac Rehab from 10/14/2017 in [facility] Cardiac and Pulmonary Rehab  Date  09/18/17  Educator  AS  Instruction Review Code  1- Verbalizes Understanding      Stress and Anxiety: - Provides group verbal and written instruction about the health risks of elevated stress and causes of high stress.  Discuss the correlation between heart/lung disease and anxiety and treatment options. Review healthy ways to manage with stress and anxiety.   Cardiac Rehab from  10/14/2017 in [facility] Cardiac and Pulmonary Rehab  Date  09/25/17  Educator  [facility]  Instruction Review Code  1- Verbalizes Understanding      Depression: - Provides group verbal and written instruction on the correlation between heart/lung disease and depressed mood, treatment options, and the stigmas associated with seeking treatment.   Anatomy & Physiology of the Heart: - Group verbal and written instruction and models provide basic cardiac anatomy and physiology, with the coronary electrical and arterial systems. Review of Valvular disease and Heart Failure   Cardiac Rehab from 10/14/2017 in [facility] Cardiac and Pulmonary Rehab  Date  10/07/17  Educator  CE  Instruction Review Code  1- Verbalizes Understanding      Cardiac Procedures: - Group verbal and written instruction to review commonly prescribed medications for heart disease. Reviews the medication, class of the drug, and side effects. Includes the steps to properly store meds and maintain the prescription regimen. (beta blockers and nitrates)   Cardiac Medications I: - Group verbal and  written instruction to review commonly prescribed medications for heart disease. Reviews the medication, class of the drug, and side effects. Includes the steps to properly store meds and maintain the prescription regimen.   Cardiac Rehab from 10/14/2017 in [facility] Cardiac and Pulmonary Rehab  Date  09/30/17  Educator  CE  Instruction Review Code  1- Verbalizes Understanding      Cardiac Medications II: -Group verbal and written instruction to review commonly prescribed medications for heart disease. Reviews the medication, class of the drug, and side effects. (all other drug classes)   Cardiac Rehab from 10/14/2017 in [facility] Cardiac and Pulmonary Rehab  Date  09/23/17  Educator  CE  Instruction Review Code  1- Verbalizes Understanding       Go Sex-Intimacy & Heart Disease, Get SMART - Goal Setting: - Group verbal and written instruction through game format to discuss heart disease and the return to sexual intimacy. Provides group verbal and written material to discuss and apply goal setting through the application of the S.M.A.R.T. Method.   Other Matters of the Heart: - Provides group verbal, written materials and models to describe Stable Angina and Peripheral Artery. Includes description of the disease process and treatment options available to the cardiac patient.   Exercise & Equipment Safety: - Individual verbal instruction and demonstration of equipment use and safety with use of the equipment.   Cardiac Rehab from 10/14/2017 in [facility] Cardiac and Pulmonary Rehab  Date  08/01/17  Educator  [facility]  Instruction Review Code  1- Verbalizes Understanding      Infection Prevention: - Provides verbal and written material to individual with discussion of infection control including proper hand washing and proper equipment cleaning during exercise session.   Cardiac Rehab from 10/14/2017 in [facility] Cardiac and Pulmonary Rehab  Date  08/01/17  Educator  [facility]  Instruction Review Code  1- Verbalizes  Understanding      Falls Prevention: - Provides verbal and written material to individual with discussion of falls prevention and safety.   Cardiac Rehab from 10/14/2017 in [facility] Cardiac and Pulmonary Rehab  Date  08/01/17  Educator  [facility]  Instruction Review Code  1- Verbalizes Understanding      Diabetes: - Individual verbal and written instruction to review signs/symptoms of diabetes, desired ranges of glucose level fasting, after meals and with exercise. Acknowledge that pre and post exercise glucose checks will be done for 3 sessions at entry of program.   Know Your Numbers and  Risk Factors: -Group verbal and written instruction about important numbers in your health.  Discussion of what are risk factors and how they play a role in the disease process.  Review of Cholesterol, Blood Pressure, Diabetes, and BMI and the role they play in your overall health.   Cardiac Rehab from 10/14/2017 in [facility] Cardiac and Pulmonary Rehab  Date  09/23/17  Educator  CE  Instruction Review Code  1- Verbalizes Understanding      Sleep Hygiene: -Provides group verbal and written instruction about how sleep can affect your health.  Define sleep hygiene, discuss sleep cycles and impact of sleep habits. Review good sleep hygiene tips.    Cardiac Rehab from 10/14/2017 in [facility] Cardiac and Pulmonary Rehab  Date  10/09/17  Educator  Lucianne Lei, MSW  Instruction Review Code  2- Demonstrated Understanding      Other: -Provides group and verbal instruction on various topics (see comments)   Knowledge Questionnaire Score: Knowledge Questionnaire Score - 08/01/17 1413      Knowledge Questionnaire Score   Pre Score  18/28 correct answers reviewed with Simona Huh       Core Components/Risk Factors/Patient Goals at Admission: Personal Goals and Risk Factors at Admission - 08/01/17 1354      Core Components/Risk Factors/Patient Goals on Admission    Weight Management  Yes;Obesity;Weight Loss     Intervention  Weight Management: Develop a combined nutrition and exercise program designed to reach desired caloric intake, while maintaining appropriate intake of nutrient and fiber, sodium and fats, and appropriate energy expenditure required for the weight goal.;Weight Management: Provide education and appropriate resources to help participant work on and attain dietary goals.;Weight Management/Obesity: Establish reasonable short term and long term weight goals.;Obesity: Provide education and appropriate resources to help participant work on and attain dietary goals.    Admit Weight  179 lb 3.2 oz (81.3 kg)    Goal Weight: Short Term  174 lb (78.9 kg)    Goal Weight: Long Term  165 lb (74.8 kg)    Expected Outcomes  Short Term: Continue to assess and modify interventions until short term weight is achieved;Long Term: Adherence to nutrition and physical activity/exercise program aimed toward attainment of established weight goal;Weight Loss: Understanding of general recommendations for a balanced deficit meal plan, which promotes 1-2 lb weight loss per week and includes a negative energy balance of [phone] kcal/d;Understanding recommendations for meals to include 15-35% energy as protein, 25-35% energy from fat, 35-60% energy from carbohydrates, less than '200mg'$  of dietary cholesterol, 20-35 gm of total fiber daily;Understanding of distribution of calorie intake throughout the day with the consumption of 4-5 meals/snacks    Hypertension  Yes    Intervention  Provide education on lifestyle modifcations including regular physical activity/exercise, weight management, moderate sodium restriction and increased consumption of fresh fruit, vegetables, and low fat dairy, alcohol moderation, and smoking cessation.;Monitor prescription use compliance.    Expected Outcomes  Short Term: Continued assessment and intervention until BP is < 140/6m HG in hypertensive participants. < 130/845mHG in hypertensive  participants with diabetes, heart failure or chronic kidney disease.;Long Term: Maintenance of blood pressure at goal levels.    Lipids  Yes    Intervention  Provide education and support for participant on nutrition & aerobic/resistive exercise along with prescribed medications to achieve LDL '70mg'$ , HDL >'40mg'$ .    Expected Outcomes  Short Term: Participant states understanding of desired cholesterol values and is compliant with medications prescribed. Participant is following exercise prescription and  nutrition guidelines.;Long Term: Cholesterol controlled with medications as prescribed, with individualized exercise RX and with personalized nutrition plan. Value goals: LDL < '70mg'$ , HDL > 40 mg.    Stress  Yes Glenmore is stressed about having a heart attack. He says he has never had any type of heart issues before, so this is all new.    Intervention  Offer individual and/or small group education and counseling on adjustment to heart disease, stress management and health-related lifestyle change. Teach and support self-help strategies.;Refer participants experiencing significant psychosocial distress to appropriate mental health specialists for further evaluation and treatment. When possible, include family members and significant others in education/counseling sessions.    Expected Outcomes  Short Term: Participant demonstrates changes in health-related behavior, relaxation and other stress management skills, ability to obtain effective social support, and compliance with psychotropic medications if prescribed.;Long Term: Emotional wellbeing is indicated by absence of clinically significant psychosocial distress or social isolation.       Core Components/Risk Factors/Patient Goals Review:  Goals and Risk Factor Review    Row Name 09/30/17 1623             Core Components/Risk Factors/Patient Goals Review   Personal Goals Review  Weight Management/Obesity;Lipids;Hypertension;Improve shortness of  breath with ADL's       Review  Jonothan has had good BP readings and is taking all meds as directed.  He has had SOB and has spoken to his Dr - who believes its from [place] - it happens at night or when he is sitting still.  Follow up on this symtpom with Dr.  It doesnt happen with exercise or other activities.         Expected Outcomes  Short - Arinze wil continue to exrecise and take meds Long - Zaviyar will maintain exercise          Core Components/Risk Factors/Patient Goals at Discharge (Final Review):  Goals and Risk Factor Review - 09/30/17 1623      Core Components/Risk Factors/Patient Goals Review   Personal Goals Review  Weight Management/Obesity;Lipids;Hypertension;Improve shortness of breath with ADL's    Review  Malvin has had good BP readings and is taking all meds as directed.  He has had SOB and has spoken to his Dr - who believes its from [place] - it happens at night or when he is sitting still.  Follow up on this symtpom with Dr.  It doesnt happen with exercise or other activities.      Expected Outcomes  Short - Darrek wil continue to exrecise and take meds Long - Macky will maintain exercise       ITP Comments: ITP Comments    Row Name 08/01/17 1344 08/21/17 0638 08/28/17 1212 09/12/17 1646 09/18/17 0602   ITP Comments  Med review completed. Initial ITP created. Diagnosis can be found in [facility] 07/20/17  30 Day review. Continue with ITP unless directed changes per Medical Director review.  New to program.  1/28 had another coronary stent placed, will wait for medical clearance to return to the program  Buren had a recent cath and Dr Fletcher Anon wants to wait until the next plan of treatment is decided before he continues exercise.  Returns today, no med changes after third stent staged and completed  30 day review. Continue with ITP unless directed changes per Medical Director review.    1 visit this month   Row Name 09/30/17 1731 10/16/17 0650         ITP Comments  Dietician  appointment scheduled for October 17, 2017.   30 Day review. Continue with ITP unless directed changes per Medical Director review.           Comments:

## 2017-10-16 NOTE — Progress Notes (Signed)
Daily Session Note  Patient Details  Name: Todd Weaver MRN: [id number] Date of Birth: 06-27-1951 Referring Provider:     Cardiac Rehab from 08/01/2017 in [facility] Cardiac and Pulmonary Rehab  Referring Provider  Arida      Encounter Date: 10/16/2017  Check In: Session Check In - 10/16/17 1737      Check-In   Location  [facility]-Cardiac & Pulmonary Rehab    Staff Present  Renita Papa, RN Vickki Hearing, BA, ACSM CEP, Exercise Physiologist;Carroll Enterkin, RN, BSN    Supervising physician immediately available to respond to emergencies  See telemetry face sheet for immediately available ER MD    Medication changes reported      No    Fall or balance concerns reported     No    Warm-up and Cool-down  Performed on first and last piece of equipment    Resistance Training Performed  Yes    VAD Patient?  No      Pain Assessment   Currently in Pain?  No/denies    Multiple Pain Sites  No          Social History   Tobacco Use  Smoking Status Former Smoker  . Last attempt to quit: 07/24/2007  . Years since quitting: 10.2  Smokeless Tobacco Never Used    Goals Met:  Independence with exercise equipment Exercise tolerated well No report of cardiac concerns or symptoms Strength training completed today  Goals Unmet:  Not Applicable  Comments: Pt able to follow exercise prescription today without complaint.  Will continue to monitor for progression.    Dr. Emily Filbert is Medical Director for [place] [facility].

## 2017-10-17 ENCOUNTER — Other Ambulatory Visit: Payer: Self-pay | Admitting: Cardiovascular Disease

## 2017-10-17 VITALS — Ht 67.5 in | Wt 188.0 lb

## 2017-10-17 DIAGNOSIS — I213 ST elevation (STEMI) myocardial infarction of unspecified site: Secondary | ICD-10-CM

## 2017-10-17 DIAGNOSIS — Z48812 Encounter for surgical aftercare following surgery on the circulatory system: Secondary | ICD-10-CM | POA: Diagnosis not present

## 2017-10-17 NOTE — Progress Notes (Signed)
Daily Session Note  Patient Details  Name: Todd Weaver MRN: [id number] Date of Birth: 06/10/1951 Referring Provider:     Cardiac Rehab from 08/01/2017 in [facility] Cardiac and Pulmonary Rehab  Referring Provider  Arida      Encounter Date: 10/17/2017  Check In: Session Check In - 10/17/17 1652      Check-In   Location  [facility]-Cardiac & Pulmonary Rehab    Staff Present  Justin Mend RCP,RRT,BSRT;Meredith Sherryll Burger, RN BSN;Laureen Janell Quiet, RRT, Respiratory Therapist    Supervising physician immediately available to respond to emergencies  See telemetry face sheet for immediately available ER MD    Medication changes reported      No    Fall or balance concerns reported     No    Tobacco Cessation  No Change    Warm-up and Cool-down  Performed on first and last piece of equipment    Resistance Training Performed  Yes    VAD Patient?  No      Pain Assessment   Currently in Pain?  No/denies          Social History   Tobacco Use  Smoking Status Former Smoker  . Last attempt to quit: 07/24/2007  . Years since quitting: 10.2  Smokeless Tobacco Never Used    Goals Met:  Independence with exercise equipment Exercise tolerated well No report of cardiac concerns or symptoms Strength training completed today  Goals Unmet:  Not Applicable  Comments:  6 Minute Walk    Row Name 08/01/17 1446 10/17/17 1632       6 Minute Walk   Phase  -  Discharge    Distance  1475 feet  1675 feet    Distance % Change  -  13 %    Distance Feet Change  -  200 ft    Walk Time  6 minutes  6 minutes    # of Rest Breaks  0  0    MPH  2.8  3.17    METS  3.64  3.95    RPE  11  13    Perceived Dyspnea   0  0    VO2 Peak  12.73  13.83    Symptoms  No  Yes (comment)    Comments  -  calves "burning" 8/10     Resting HR  59 bpm  59 bpm    Resting BP  118/66  134/64    Resting Oxygen Saturation   92 %  98 %    Exercise Oxygen Saturation  during 6 min walk  99 %  99 %    Max Ex. HR  114 bpm  100 bpm     Max Ex. BP  128/66  152/76    2 Minute Post BP  112/66  -        Dr. Emily Filbert is Medical Director for [place] and [facility].

## 2017-10-21 ENCOUNTER — Encounter: Payer: Self-pay | Admitting: *Deleted

## 2017-10-21 ENCOUNTER — Encounter: Payer: 59 | Attending: Cardiovascular Disease | Admitting: *Deleted

## 2017-10-21 DIAGNOSIS — I213 ST elevation (STEMI) myocardial infarction of unspecified site: Secondary | ICD-10-CM | POA: Diagnosis not present

## 2017-10-21 DIAGNOSIS — Z955 Presence of coronary angioplasty implant and graft: Secondary | ICD-10-CM

## 2017-10-21 DIAGNOSIS — Z48812 Encounter for surgical aftercare following surgery on the circulatory system: Secondary | ICD-10-CM | POA: Insufficient documentation

## 2017-10-21 NOTE — Patient Instructions (Signed)
Discharge Patient Instructions  Patient Details  Name: Todd Weaver MRN: [id number] Date of Birth: 1950-08-15 Referring Provider:  Wellington Hampshire, MD   Number of Visits: 86  Reason for Discharge:  Patient reached a stable level of exercise. Patient independent in their exercise. Patient has met program and personal goals.  Smoking History:  Social History   Tobacco Use  Smoking Status Former Smoker  . Last attempt to quit: 07/24/2007  . Years since quitting: 10.2  Smokeless Tobacco Never Used    Diagnosis:  ST elevation myocardial infarction (STEMI), unspecified artery (HCC)  Initial Exercise Prescription: Initial Exercise Prescription - 08/01/17 1400      Date of Initial Exercise RX and Referring Provider   Date  08/01/17    Referring Provider  Arida      Treadmill   MPH  2.8    Grade  1    Minutes  15    METs  3.5      Recumbant Bike   Level  3    RPM  60    Watts  40    Minutes  15    METs  3.5      NuStep   Level  3    SPM  80    Minutes  15    METs  3.5      REL-XR   Level  3    Speed  50    Minutes  15    METs  3.5      Prescription Details   Frequency (times per week)  3    Duration  Progress to 45 minutes of aerobic exercise without signs/symptoms of physical distress      Intensity   THRR 40-80% of Max Heartrate  97-137    Ratings of Perceived Exertion  11-13    Perceived Dyspnea  0-4      Resistance Training   Training Prescription  Yes    Weight  3 lb    Reps  10-15       Discharge Exercise Prescription (Final Exercise Prescription Changes): Exercise Prescription Changes - 10/09/17 1400      Response to Exercise   Blood Pressure (Admit)  134/64    Blood Pressure (Exercise)  146/64    Blood Pressure (Exit)  142/60    Heart Rate (Admit)  69 bpm    Heart Rate (Exercise)  111 bpm    Heart Rate (Exit)  67 bpm    Rating of Perceived Exertion (Exercise)  13    Symptoms  none    Duration  Continue with 45 min of aerobic  exercise without signs/symptoms of physical distress.    Intensity  THRR unchanged      Progression   Progression  Continue to progress workloads to maintain intensity without signs/symptoms of physical distress.    Average METs  4.9      Resistance Training   Training Prescription  Yes    Weight  3 lb    Reps  10-15      Treadmill   MPH  2.8    Grade  1    Minutes  15    METs  3.5      NuStep   Level  4    SPM  80    Minutes  15    METs  4.5      Home Exercise Plan   Plans to continue exercise at  [facility] (comment)  Frequency  Add 1 additional day to program exercise sessions.    Initial Home Exercises Provided  09/26/17       Functional Capacity: 6 Minute Walk    Row Name 08/01/17 1446 10/17/17 1632       6 Minute Walk   Phase  -  Discharge    Distance  1475 feet  1675 feet    Distance % Change  -  13 %    Distance Feet Change  -  200 ft    Walk Time  6 minutes  6 minutes    # of Rest Breaks  0  0    MPH  2.8  3.17    METS  3.64  3.95    RPE  11  13    Perceived Dyspnea   0  0    VO2 Peak  12.73  13.83    Symptoms  No  Yes (comment)    Comments  -  calves "burning" 8/10     Resting HR  59 bpm  59 bpm    Resting BP  118/66  134/64    Resting Oxygen Saturation   92 %  98 %    Exercise Oxygen Saturation  during 6 min walk  99 %  99 %    Max Ex. HR  114 bpm  100 bpm    Max Ex. BP  128/66  152/76    2 Minute Post BP  112/66  -       Quality of Life: Quality of Life - 08/01/17 1412      Quality of Life Scores   Health/Function Pre  20.73 %    Socioeconomic Pre  20.88 %    Psych/Spiritual Pre  21 %    Family Pre  21 %    GLOBAL Pre  20.86 %       Personal Goals: Goals established at orientation with interventions provided to work toward goal. Personal Goals and Risk Factors at Admission - 08/01/17 1354      Core Components/Risk Factors/Patient Goals on Admission    Weight Management  Yes;Obesity;Weight Loss    Intervention  Weight  Management: Develop a combined nutrition and exercise program designed to reach desired caloric intake, while maintaining appropriate intake of nutrient and fiber, sodium and fats, and appropriate energy expenditure required for the weight goal.;Weight Management: Provide education and appropriate resources to help participant work on and attain dietary goals.;Weight Management/Obesity: Establish reasonable short term and long term weight goals.;Obesity: Provide education and appropriate resources to help participant work on and attain dietary goals.    Admit Weight  179 lb 3.2 oz (81.3 kg)    Goal Weight: Short Term  174 lb (78.9 kg)    Goal Weight: Long Term  165 lb (74.8 kg)    Expected Outcomes  Short Term: Continue to assess and modify interventions until short term weight is achieved;Long Term: Adherence to nutrition and physical activity/exercise program aimed toward attainment of established weight goal;Weight Loss: Understanding of general recommendations for a balanced deficit meal plan, which promotes 1-2 lb weight loss per week and includes a negative energy balance of [phone] kcal/d;Understanding recommendations for meals to include 15-35% energy as protein, 25-35% energy from fat, 35-60% energy from carbohydrates, less than '200mg'$  of dietary cholesterol, 20-35 gm of total fiber daily;Understanding of distribution of calorie intake throughout the day with the consumption of 4-5 meals/snacks    Hypertension  Yes    Intervention  Provide  education on lifestyle modifcations including regular physical activity/exercise, weight management, moderate sodium restriction and increased consumption of fresh fruit, vegetables, and low fat dairy, alcohol moderation, and smoking cessation.;Monitor prescription use compliance.    Expected Outcomes  Short Term: Continued assessment and intervention until BP is < 140/19m HG in hypertensive participants. < 130/83mHG in hypertensive participants with diabetes,  heart failure or chronic kidney disease.;Long Term: Maintenance of blood pressure at goal levels.    Lipids  Yes    Intervention  Provide education and support for participant on nutrition & aerobic/resistive exercise along with prescribed medications to achieve LDL '70mg'$ , HDL >'40mg'$ .    Expected Outcomes  Short Term: Participant states understanding of desired cholesterol values and is compliant with medications prescribed. Participant is following exercise prescription and nutrition guidelines.;Long Term: Cholesterol controlled with medications as prescribed, with individualized exercise RX and with personalized nutrition plan. Value goals: LDL < '70mg'$ , HDL > 40 mg.    Stress  Yes DeCiprianos stressed about having a heart attack. He says he has never had any type of heart issues before, so this is all new.    Intervention  Offer individual and/or small group education and counseling on adjustment to heart disease, stress management and health-related lifestyle change. Teach and support self-help strategies.;Refer participants experiencing significant psychosocial distress to appropriate mental health specialists for further evaluation and treatment. When possible, include family members and significant others in education/counseling sessions.    Expected Outcomes  Short Term: Participant demonstrates changes in health-related behavior, relaxation and other stress management skills, ability to obtain effective social support, and compliance with psychotropic medications if prescribed.;Long Term: Emotional wellbeing is indicated by absence of clinically significant psychosocial distress or social isolation.        Personal Goals Discharge: Goals and Risk Factor Review - 09/30/17 1623      Core Components/Risk Factors/Patient Goals Review   Personal Goals Review  Weight Management/Obesity;Lipids;Hypertension;Improve shortness of breath with ADL's    Review  DeYuremas had good BP readings and is taking all  meds as directed.  He has had SOB and has spoken to his Dr - who believes its [place] it happens at night or when he is sitting still.  Follow up on this symtpom with Dr.  It doesnt happen with exercise or other activities.      Expected Outcomes  Short - DeKeremil continue to exrecise and take meds Long - DeFabrizzioill maintain exercise       Exercise Goals and Review: Exercise Goals    Row Name 08/01/17 1443             Exercise Goals   Increase Physical Activity  Yes       Intervention  Provide advice, education, support and counseling about physical activity/exercise needs.;Develop an individualized exercise prescription for aerobic and resistive training based on initial evaluation findings, risk stratification, comorbidities and participant's personal goals.       Expected Outcomes  Achievement of increased cardiorespiratory fitness and enhanced flexibility, muscular endurance and strength shown through measurements of functional capacity and personal statement of participant.       Increase Strength and Stamina  Yes       Intervention  Provide advice, education, support and counseling about physical activity/exercise needs.;Develop an individualized exercise prescription for aerobic and resistive training based on initial evaluation findings, risk stratification, comorbidities and participant's personal goals.       Expected Outcomes  Achievement of increased cardiorespiratory  fitness and enhanced flexibility, muscular endurance and strength shown through measurements of functional capacity and personal statement of participant.       Able to understand and use rate of perceived exertion (RPE) scale  Yes       Intervention  Provide education and explanation on how to use RPE scale       Expected Outcomes  Short Term: Able to use RPE daily in rehab to express subjective intensity level;Long Term:  Able to use RPE to guide intensity level when exercising independently       Able to  understand and use Dyspnea scale  Yes       Intervention  Provide education and explanation on how to use Dyspnea scale       Expected Outcomes  Short Term: Able to use Dyspnea scale daily in rehab to express subjective sense of shortness of breath during exertion;Long Term: Able to use Dyspnea scale to guide intensity level when exercising independently       Knowledge and understanding of Target Heart Rate Range (THRR)  Yes       Intervention  Provide education and explanation of THRR including how the numbers were predicted and where they are located for reference       Expected Outcomes  Short Term: Able to state/look up THRR;Short Term: Able to use daily as guideline for intensity in rehab;Long Term: Able to use THRR to govern intensity when exercising independently       Able to check pulse independently  Yes       Intervention  Provide education and demonstration on how to check pulse in carotid and radial arteries.;Review the importance of being able to check your own pulse for safety during independent exercise       Expected Outcomes  Short Term: Able to explain why pulse checking is important during independent exercise;Long Term: Able to check pulse independently and accurately       Understanding of Exercise Prescription  Yes       Intervention  Provide education, explanation, and written materials on patient's individual exercise prescription       Expected Outcomes  Short Term: Able to explain program exercise prescription;Long Term: Able to explain home exercise prescription to exercise independently          Nutrition & Weight - Outcomes: Pre Biometrics - 08/01/17 1442      Pre Biometrics   Height  5' 7.5" (1.715 m)    Weight  179 lb 3.2 oz (81.3 kg)    Waist Circumference  38.25 inches    Hip Circumference  39.5 inches    Waist to Hip Ratio  0.97 %    BMI (Calculated)  27.64    Single Leg Stand  30 seconds      Post Biometrics - 10/17/17 1631       Post  Biometrics    Height  5' 7.5" (1.715 m)    Weight  188 lb (85.3 kg)    Waist Circumference  38.25 inches    Hip Circumference  39.5 inches    Waist to Hip Ratio  0.97 %    BMI (Calculated)  28.99    Single Leg Stand  12 seconds       Nutrition: Nutrition Therapy & Goals - 08/01/17 1401      Intervention Plan   Intervention  Prescribe, educate and counsel regarding individualized specific dietary modifications aiming towards targeted core components such as weight, hypertension, lipid management, diabetes,  heart failure and other comorbidities.;Nutrition handout(s) given to patient.    Expected Outcomes  Short Term Goal: Understand basic principles of dietary content, such as calories, fat, sodium, cholesterol and nutrients.;Short Term Goal: A plan has been developed with personal nutrition goals set during dietitian appointment.;Long Term Goal: Adherence to prescribed nutrition plan.       Nutrition Discharge: Nutrition Assessments - 08/01/17 1402      MEDFICTS Scores   Pre Score  16       Education Questionnaire Score: Knowledge Questionnaire Score - 08/01/17 1413      Knowledge Questionnaire Score   Pre Score  18/28 correct answers reviewed with Simona Huh       Goals reviewed with patient; copy given to patient.

## 2017-10-21 NOTE — Progress Notes (Signed)
Daily Session Note  Patient Details  Name: Todd Weaver MRN: [id number] Date of Birth: Sep 24, 1950 Referring Provider:     Cardiac Rehab from 08/01/2017 in [facility]  Referring Provider  Arida      Encounter Date: 10/21/2017  Check In: Session Check In - 10/21/17 1641      Check-In   Location  [facility]-Cardiac & Pulmonary Rehab    Staff Present  Earlean Shawl, BS, ACSM CEP, Exercise Physiologist;Amanda Oletta Darter, BA, ACSM CEP, Exercise Physiologist;Carroll Enterkin, RN, BSN    Supervising physician immediately available to respond to emergencies  See telemetry face sheet for immediately available ER MD    Medication changes reported      No    Fall or balance concerns reported     No    Warm-up and Cool-down  Performed on first and last piece of equipment    Resistance Training Performed  Yes    VAD Patient?  No      Pain Assessment   Currently in Pain?  No/denies    Multiple Pain Sites  No          Social History   Tobacco Use  Smoking Status Former Smoker  . Last attempt to quit: 07/24/2007  . Years since quitting: 10.2  Smokeless Tobacco Never Used    Goals Met:  Independence with exercise equipment Exercise tolerated well No report of cardiac concerns or symptoms Strength training completed today  Goals Unmet:  Not Applicable  Comments: Pt able to follow exercise prescription today without complaint.  Will continue to monitor for progression.   Todd Weaver graduated today from  rehab with 36 sessions completed.  Details of the patient's exercise prescription and what He needs to do in order to continue the prescription and progress were discussed with patient.  Patient was given a copy of prescription and goals.  Patient verbalized understanding.  Todd Weaver plans to continue to exercise by joining the [employer] on the hospital campus.    Dr. Emily Filbert is Medical Director for [place] and [facility].

## 2017-10-22 NOTE — Progress Notes (Signed)
Cardiac Individual Treatment Plan  Patient Details  Name: TANK DIFIORE MRN: [id number] Date of Birth: 08-16-1950 Referring Provider:     Cardiac Rehab from 08/01/2017 in [facility] Cardiac and Pulmonary Rehab  Referring Provider  Arida      Initial Encounter Date:    Cardiac Rehab from 08/01/2017 in [facility] Cardiac and Pulmonary Rehab  Date  08/01/17  Referring Provider  Fletcher Anon      Visit Diagnosis: ST elevation myocardial infarction (STEMI), unspecified artery [facility])  Status post coronary artery stent placement  Patient's Home Medications on Admission:  Current Outpatient Medications:  .  aspirin EC 81 MG tablet, Take 81 mg by mouth daily. , Disp: , Rfl:  .  atorvastatin (LIPITOR) 80 MG tablet, Take 1 tablet (80 mg total) by mouth daily at 6 PM., Disp: 90 tablet, Rfl: 3 .  clopidogrel (PLAVIX) 75 MG tablet, Take 300 mg (4 tablets) by mouth the first day, then take 75 mg (1 tablet) by mouth once a day thereafter., Disp: 90 tablet, Rfl: 3 .  ezetimibe (ZETIA) 10 MG tablet, Take 1 tablet (10 mg total) by mouth daily., Disp: 90 tablet, Rfl: 3 .  losartan (COZAAR) 25 MG tablet, Take 1 tablet (25 mg total) by mouth daily., Disp: 30 tablet, Rfl: 2 .  metoprolol succinate (TOPROL-XL) 25 MG 24 hr tablet, Take 0.5 tablets (12.5 mg total) by mouth daily., Disp: 30 tablet, Rfl: 2 .  nitroGLYCERIN (NITROSTAT) 0.4 MG SL tablet, Place 1 tablet (0.4 mg total) under the tongue every 5 (five) minutes as needed for chest pain., Disp: 30 tablet, Rfl: 0 .  tamsulosin (FLOMAX) 0.4 MG CAPS capsule, Take 1 capsule (0.4 mg total) by mouth daily., Disp: 90 capsule, Rfl: 3  Past Medical History: Past Medical History:  Diagnosis Date  . Acute ST elevation myocardial infarction (STEMI) of inferior wall ([place]) 07/18/2017  . CAD (coronary artery disease)    a. nuc stress test 9/17: small defect of mild severity in the apex location, partially reversible and suspected to be due to artifact given intense GI uptake, EF  55-65%, low risk study; b. inferior STEMI 07/18/17  . GERD (gastroesophageal reflux disease)   . Hemorrhoid   . Hyperlipidemia   . Hypertension   . Stroke [facility]) 2011   "2 MINI STROKES"    Tobacco Use: Social History   Tobacco Use  Smoking Status Former Smoker  . Last attempt to quit: 07/24/2007  . Years since quitting: 10.2  Smokeless Tobacco Never Used    Labs: Recent Review Scientist, physiological    Labs for ITP Cardiac and Pulmonary Rehab Latest Ref Rng & Units 06/19/2017 07/17/2017 07/18/2017 09/24/2017   Cholestrol 100 - 199 mg/dL 235(H) 223(H) 216(H) 164   LDLCALC 0 - 99 mg/dL 155(H) 130(H) 129(H) 102(H)   LDLDIRECT 0 - 99 mg/dL - - - 116(H)   HDL >39 mg/dL 61 73 66 42   Trlycerides 0 - 149 mg/dL 93 99 106 99   Hemoglobin A1c 4.8 - 5.6 % - - 5.8(H) -       Exercise Target Goals:    Exercise Program Goal: Individual exercise prescription set using results from initial 6 min walk test and THRR while considering  patient's activity barriers and safety.   Exercise Prescription Goal: Initial exercise prescription builds to 30-45 minutes a day of aerobic activity, 2-3 days per week.  Home exercise guidelines will be given to patient during program as part of exercise prescription that the participant will acknowledge.  Activity Barriers & Risk Stratification: Activity Barriers & Cardiac Risk Stratification - 08/01/17 1415      Activity Barriers & Cardiac Risk Stratification   Activity Barriers  Back Problems;Shortness of Breath back surgery May 2018    Cardiac Risk Stratification  Moderate       6 Minute Walk: 6 Minute Walk    Row Name 08/01/17 1446 10/17/17 1632       6 Minute Walk   Phase  -  Discharge    Distance  1475 feet  1675 feet    Distance % Change  -  13 %    Distance Feet Change  -  200 ft    Walk Time  6 minutes  6 minutes    # of Rest Breaks  0  0    MPH  2.8  3.17    METS  3.64  3.95    RPE  11  13    Perceived Dyspnea   0  0    VO2 Peak  12.73   13.83    Symptoms  No  Yes (comment)    Comments  -  calves "burning" 8/10     Resting HR  59 bpm  59 bpm    Resting BP  118/66  134/64    Resting Oxygen Saturation   92 %  98 %    Exercise Oxygen Saturation  during 6 min walk  99 %  99 %    Max Ex. HR  114 bpm  100 bpm    Max Ex. BP  128/66  152/76    2 Minute Post BP  112/66  -       Oxygen Initial Assessment:   Oxygen Re-Evaluation:   Oxygen Discharge (Final Oxygen Re-Evaluation):   Initial Exercise Prescription: Initial Exercise Prescription - 08/01/17 1400      Date of Initial Exercise RX and Referring Provider   Date  08/01/17    Referring Provider  Arida      Treadmill   MPH  2.8    Grade  1    Minutes  15    METs  3.5      Recumbant Bike   Level  3    RPM  60    Watts  40    Minutes  15    METs  3.5      NuStep   Level  3    SPM  80    Minutes  15    METs  3.5      REL-XR   Level  3    Speed  50    Minutes  15    METs  3.5      Prescription Details   Frequency (times per week)  3    Duration  Progress to 45 minutes of aerobic exercise without signs/symptoms of physical distress      Intensity   THRR 40-80% of Max Heartrate  97-137    Ratings of Perceived Exertion  11-13    Perceived Dyspnea  0-4      Resistance Training   Training Prescription  Yes    Weight  3 lb    Reps  10-15       Perform Capillary Blood Glucose checks as needed.  Exercise Prescription Changes: Exercise Prescription Changes    Row Name 08/01/17 1400 09/25/17 1300 09/26/17 1700 10/09/17 1400       Response to Exercise   Blood Pressure (Admit)  118/66  138/68  -  134/64    Blood Pressure (Exercise)  128/66  130/70  -  146/64    Blood Pressure (Exit)  -  120/78  -  142/60    Heart Rate (Admit)  59 bpm  60 bpm  -  69 bpm    Heart Rate (Exercise)  114 bpm  111 bpm  -  111 bpm    Heart Rate (Exit)  57 bpm  64 bpm  -  67 bpm    Oxygen Saturation (Admit)  92 %  -  -  -    Oxygen Saturation (Exit)  99 %  -  -  -     Rating of Perceived Exertion (Exercise)  11  -  -  13    Symptoms  -  none  -  none    Duration  -  Continue with 45 min of aerobic exercise without signs/symptoms of physical distress.  -  Continue with 45 min of aerobic exercise without signs/symptoms of physical distress.    Intensity  -  THRR unchanged  -  THRR unchanged      Progression   Progression  -  Continue to progress workloads to maintain intensity without signs/symptoms of physical distress.  Continue to progress workloads to maintain intensity without signs/symptoms of physical distress.  Continue to progress workloads to maintain intensity without signs/symptoms of physical distress.    Average METs  -  4.6  4.6  4.9      Resistance Training   Training Prescription  -  Yes  Yes  Yes    Weight  -  3 lb  3 lb  3 lb    Reps  -  10-15  10-15  10-15      Treadmill   MPH  -  2.8  2.8  2.8    Grade  -  _0 Minutes  -  _1 METs  -  3.5  3.5  3.5      NuStep   Level  -  -  -  4    SPM  -  -  -  80    Minutes  -  -  -  15    METs  -  -  -  4.5      REL-XR   Level  -  3  3  -    Speed  -  50  50  -    Minutes  -  15  15  -    METs  -  5.7  5.7  -      Home Exercise Plan   Plans to continue exercise at  -  -  [employer] (comment)  Forensic scientist (comment)    Frequency  -  -  Add 1 additional day to program exercise sessions.  Add 1 additional day to program exercise sessions.    Initial Home Exercises Provided  -  -  09/26/17  09/26/17       Exercise Comments: Exercise Comments    Row Name 08/07/17 1725           Exercise Comments  First full day of exercise!  Patient was oriented to gym and equipment including functions, settings, policies, and procedures.  Patient's individual exercise prescription and treatment plan were reviewed.  All starting workloads were established based on the results of the  6 minute walk test done at initial orientation visit.  The plan for exercise  progression was also introduced and progression will be customized based on patient's performance and goals          Exercise Goals and Review: Exercise Goals    Row Name 08/01/17 1443             Exercise Goals   Increase Physical Activity  Yes       Intervention  Provide advice, education, support and counseling about physical activity/exercise needs.;Develop an individualized exercise prescription for aerobic and resistive training based on initial evaluation findings, risk stratification, comorbidities and participant's personal goals.       Expected Outcomes  Achievement of increased cardiorespiratory fitness and enhanced flexibility, muscular endurance and strength shown through measurements of functional capacity and personal statement of participant.       Increase Strength and Stamina  Yes       Intervention  Provide advice, education, support and counseling about physical activity/exercise needs.;Develop an individualized exercise prescription for aerobic and resistive training based on initial evaluation findings, risk stratification, comorbidities and participant's personal goals.       Expected Outcomes  Achievement of increased cardiorespiratory fitness and enhanced flexibility, muscular endurance and strength shown through measurements of functional capacity and personal statement of participant.       Able to understand and use rate of perceived exertion (RPE) scale  Yes       Intervention  Provide education and explanation on how to use RPE scale       Expected Outcomes  Short Term: Able to use RPE daily in rehab to express subjective intensity level;Long Term:  Able to use RPE to guide intensity level when exercising independently       Able to understand and use Dyspnea scale  Yes       Intervention  Provide education and explanation on how to use Dyspnea scale       Expected Outcomes  Short Term: Able to use Dyspnea scale daily in rehab to express subjective sense of  shortness of breath during exertion;Long Term: Able to use Dyspnea scale to guide intensity level when exercising independently       Knowledge and understanding of Target Heart Rate Range (THRR)  Yes       Intervention  Provide education and explanation of THRR including how the numbers were predicted and where they are located for reference       Expected Outcomes  Short Term: Able to state/look up THRR;Short Term: Able to use daily as guideline for intensity in rehab;Long Term: Able to use THRR to govern intensity when exercising independently       Able to check pulse independently  Yes       Intervention  Provide education and demonstration on how to check pulse in carotid and radial arteries.;Review the importance of being able to check your own pulse for safety during independent exercise       Expected Outcomes  Short Term: Able to explain why pulse checking is important during independent exercise;Long Term: Able to check pulse independently and accurately       Understanding of Exercise Prescription  Yes       Intervention  Provide education, explanation, and written materials on patient's individual exercise prescription       Expected Outcomes  Short Term: Able to explain program exercise prescription;Long Term: Able to explain home exercise prescription to exercise independently  Exercise Goals Re-Evaluation : Exercise Goals Re-Evaluation    Row Name 08/07/17 1725 09/25/17 1334 09/26/17 1709 10/09/17 1441       Exercise Goal Re-Evaluation   Exercise Goals Review  Able to understand and use rate of perceived exertion (RPE) scale;Knowledge and understanding of Target Heart Rate Range (THRR);Understanding of Exercise Prescription  Increase Physical Activity;Able to understand and use rate of perceived exertion (RPE) scale;Knowledge and understanding of Target Heart Rate Range (THRR);Increase Strength and Stamina  Increase Physical Activity;Understanding of Exercise  Prescription;Increase Strength and Stamina;Knowledge and understanding of Target Heart Rate Range (THRR);Able to understand and use rate of perceived exertion (RPE) scale;Able to check pulse independently  Increase Physical Activity;Increase Strength and Stamina;Able to understand and use rate of perceived exertion (RPE) scale;Knowledge and understanding of Target Heart Rate Range (THRR);Able to understand and use Dyspnea scale    Comments  Reviewed RPE scale, THR and program prescription with pt today.  Pt voiced understanding and was given a copy of goals to take home.   Pt is tolerating exercise well.  He is reaching THR range.  Staff will continue to monitor.  Home excerise guidelines reviewed with patient, handout given and signed  Hollis is making great progress and his average MET level is 4.9.  He has increased the difficulty of his strength exercise by doing more advanced options for some exercises.  Staff will work with Simona Huh to add intervals to his program    Expected Outcomes  Short: Use RPE daily to regulate intensity.  Long: Follow program prescription in THR.  Short - Pt will attend classes regularly Long - pt will continue to increase MET level  Short: Davidson will add one extra day of exercise outside the programLong: become independent with exercise.   Short -  Kolton will add  intervals to the XR and NS.  Long - Alecxander will continue to increase MET level       Discharge Exercise Prescription (Final Exercise Prescription Changes): Exercise Prescription Changes - 10/09/17 1400      Response to Exercise   Blood Pressure (Admit)  134/64    Blood Pressure (Exercise)  146/64    Blood Pressure (Exit)  142/60    Heart Rate (Admit)  69 bpm    Heart Rate (Exercise)  111 bpm    Heart Rate (Exit)  67 bpm    Rating of Perceived Exertion (Exercise)  13    Symptoms  none    Duration  Continue with 45 min of aerobic exercise without signs/symptoms of physical distress.    Intensity  THRR  unchanged      Progression   Progression  Continue to progress workloads to maintain intensity without signs/symptoms of physical distress.    Average METs  4.9      Resistance Training   Training Prescription  Yes    Weight  3 lb    Reps  10-15      Treadmill   MPH  2.8    Grade  1    Minutes  15    METs  3.5      NuStep   Level  4    SPM  80    Minutes  15    METs  4.5      Home Exercise Plan   Plans to continue exercise at  [facility] (comment)    Frequency  Add 1 additional day to program exercise sessions.    Initial Home Exercises Provided  09/26/17  Nutrition:  Target Goals: Understanding of nutrition guidelines, daily intake of sodium '1500mg'$ , cholesterol '200mg'$ , calories 30% from fat and 7% or less from saturated fats, daily to have 5 or more servings of fruits and vegetables.  Biometrics: Pre Biometrics - 08/01/17 1442      Pre Biometrics   Height  5' 7.5" (1.715 m)    Weight  179 lb 3.2 oz (81.3 kg)    Waist Circumference  38.25 inches    Hip Circumference  39.5 inches    Waist to Hip Ratio  0.97 %    BMI (Calculated)  27.64    Single Leg Stand  30 seconds      Post Biometrics - 10/17/17 1631       Post  Biometrics   Height  5' 7.5" (1.715 m)    Weight  188 lb (85.3 kg)    Waist Circumference  38.25 inches    Hip Circumference  39.5 inches    Waist to Hip Ratio  0.97 %    BMI (Calculated)  28.99    Single Leg Stand  12 seconds       Nutrition Therapy Plan and Nutrition Goals: Nutrition Therapy & Goals - 08/01/17 1401      Intervention Plan   Intervention  Prescribe, educate and counsel regarding individualized specific dietary modifications aiming towards targeted core components such as weight, hypertension, lipid management, diabetes, heart failure and other comorbidities.;Nutrition handout(s) given to patient.    Expected Outcomes  Short Term Goal: Understand basic principles of dietary content, such as calories, fat, sodium,  cholesterol and nutrients.;Short Term Goal: A plan has been developed with personal nutrition goals set during dietitian appointment.;Long Term Goal: Adherence to prescribed nutrition plan.       Nutrition Assessments: Nutrition Assessments - 08/01/17 1402      MEDFICTS Scores   Pre Score  16       Nutrition Goals Re-Evaluation: Nutrition Goals Re-Evaluation    Row Name 09/30/17 1629             Goals   Comment  Akeem would like to meet with RD.  Staff will schedule.  He has made some changes including less red meat, and more vegetables.       Expected Outcome  Short - Gumaro will meet with RD to get a more comprehensive plan Long - Joesiah will reach weight goals and keep heart healthy diet          Nutrition Goals Discharge (Final Nutrition Goals Re-Evaluation): Nutrition Goals Re-Evaluation - 09/30/17 1629      Goals   Comment  Jaquan would like to meet with RD.  Staff will schedule.  He has made some changes including less red meat, and more vegetables.    Expected Outcome  Short - Larren will meet with RD to get a more comprehensive plan Long - Jaymari will reach weight goals and keep heart healthy diet       Psychosocial: Target Goals: Acknowledge presence or absence of significant depression and/or stress, maximize coping skills, provide positive support system. Participant is able to verbalize types and ability to use techniques and skills needed for reducing stress and depression.   Initial Review & Psychosocial Screening: Initial Psych Review & Screening - 08/01/17 1405      Initial Review   Current issues with  Current Sleep Concerns;Current Stress Concerns    Comments  wakes up at 3 am every morning after his heart attack, stressed processing that he  had a heart attack and what that means for his future. He wants to learn all he can about cardiac health.       Family Dynamics   Good Support System?  Yes wife, friends      Screening Interventions    Interventions  Yes;Encouraged to exercise    Expected Outcomes  Short Term goal: Utilizing psychosocial counselor, staff and physician to assist with identification of specific Stressors or current issues interfering with healing process. Setting desired goal for each stressor or current issue identified.;Long Term Goal: Stressors or current issues are controlled or eliminated.;Short Term goal: Identification and review with participant of any Quality of Life or Depression concerns found by scoring the questionnaire.;Long Term goal: The participant improves quality of Life and PHQ9 Scores as seen by post scores and/or verbalization of changes       Quality of Life Scores:  Quality of Life - 08/01/17 1412      Quality of Life Scores   Health/Function Pre  20.73 %    Socioeconomic Pre  20.88 %    Psych/Spiritual Pre  21 %    Family Pre  21 %    GLOBAL Pre  20.86 %      Scores of 19 and below usually indicate a poorer quality of life in these areas.  A difference of  2-3 points is a clinically meaningful difference.  A difference of 2-3 points in the total score of the Quality of Life Index has been associated with significant improvement in overall quality of life, self-image, physical symptoms, and general health in studies assessing change in quality of life.  PHQ-9: Recent Review Flowsheet Data    Depression screen [facility] 2/9 08/01/2017 06/19/2017   Decreased Interest 0 0   Down, Depressed, Hopeless 0 0   PHQ - 2 Score 0 0   Altered sleeping 1 -   Tired, decreased energy 1 -   Change in appetite 0 -   Feeling bad or failure about yourself  0 -   Trouble concentrating 0 -   Moving slowly or fidgety/restless 0 -   Suicidal thoughts 0 -   PHQ-9 Score 2 -   Difficult doing work/chores Not difficult at all -     Interpretation of Total Score  Total Score Depression Severity:  1-4 = Minimal depression, 5-9 = Mild depression, 10-14 = Moderate depression, 15-19 = Moderately severe  depression, 20-27 = Severe depression   Psychosocial Evaluation and Intervention: Psychosocial Evaluation - 08/07/17 1653      Psychosocial Evaluation & Interventions   Interventions  Encouraged to exercise with the program and follow exercise prescription    Comments  Counselor met with Mr. Heideman Revloc) today for initial psychosocial evaluation.  He is a 67 year old who had a heart attack and 2 stents inserted on 12/28 (several weeks ago).  He has a strong support system with a spouse of 18 years; (2) daughters, a brother and sister who live close by and Klye is actively involved in his local church.  He had back surgery this past May and reports it was successful and less painful.  Rogue states he only sleeps 4-5 hours/night and that has been typical for the past year or so.  He has a good appetite.  Juergen denies a history of depression or anxiety or any current symptoms, and states he is typically in a positive mood most of the time.  He has minimal stress in his life other than his  health concerns.  Nadim has goals to get back to "100%" with his heart - increasing in stamina and strength.  Staff will follow him throughout the course of this program.     Expected Outcomes  Mckyle will benefit from consistent exercise to achieve his stated goals.  The educational and psychoeducational components of this program will be helpful in learning more about his condition and ways to cope more positively with it.      Continue Psychosocial Services   Follow up required by staff       Psychosocial Re-Evaluation: Psychosocial Re-Evaluation    [place] Name 09/30/17 1628             Psychosocial Re-Evaluation   Current issues with  None Identified       Comments  No new stress identified.       Interventions  Encouraged to attend Cardiac Rehabilitation for the exercise       Continue Psychosocial Services   Follow up required by staff          Psychosocial Discharge (Final Psychosocial  Re-Evaluation): Psychosocial Re-Evaluation - 09/30/17 1628      Psychosocial Re-Evaluation   Current issues with  None Identified    Comments  No new stress identified.    Interventions  Encouraged to attend Cardiac Rehabilitation for the exercise    Continue Psychosocial Services   Follow up required by staff       Vocational Rehabilitation: Provide vocational rehab assistance to qualifying candidates.   Vocational Rehab Evaluation & Intervention: Vocational Rehab - 10/21/17 1751      Initial Vocational Rehab Evaluation & Intervention   Assessment shows need for Vocational Rehabilitation  No  (Pended)        Education: Education Goals: Education classes will be provided on a variety of topics geared toward better understanding of heart health and risk factor modification. Participant will state understanding/return demonstration of topics presented as noted by education test scores.  Learning Barriers/Preferences: Learning Barriers/Preferences - 08/01/17 1412      Learning Barriers/Preferences   Learning Barriers  None    Learning Preferences  Group Instruction       Education Topics:  AED/CPR: - Group verbal and written instruction with the use of models to demonstrate the basic use of the AED with the basic ABC's of resuscitation.   General Nutrition Guidelines/Fats and Fiber: -Group instruction provided by verbal, written material, models and posters to present the general guidelines for heart healthy nutrition. Gives an explanation and review of dietary fats and fiber.   Cardiac Rehab from 10/21/2017 in [facility] Cardiac and Pulmonary Rehab  Date  10/14/17  Educator  PI  Instruction Review Code  1- Verbalizes Understanding      Controlling Sodium/Reading Food Labels: -Group verbal and written material supporting the discussion of sodium use in heart healthy nutrition. Review and explanation with models, verbal and written materials for utilization of the food label.    Cardiac Rehab from 10/21/2017 in [facility] Cardiac and Pulmonary Rehab  Date  10/21/17  Educator  PI  Instruction Review Code  1- Verbalizes Understanding      Exercise Physiology & General Exercise Guidelines: - Group verbal and written instruction with models to review the exercise physiology of the cardiovascular system and associated critical values. Provides general exercise guidelines with specific guidelines to those with heart or lung disease.    Aerobic Exercise & Resistance Training: - Gives group verbal and written instruction on the various components of  exercise. Focuses on aerobic and resistive training programs and the benefits of this training and how to safely progress through these programs..   Cardiac Rehab from 10/21/2017 in [facility] Cardiac and Pulmonary Rehab  Date  09/16/17  Educator  AS  Instruction Review Code  1- Verbalizes Understanding      Flexibility, Balance, Mind/Body Relaxation: Provides group verbal/written instruction on the benefits of flexibility and balance training, including mind/body exercise modes such as yoga, pilates and tai chi.  Demonstration and skill practice provided.   Cardiac Rehab from 10/21/2017 in [facility] Cardiac and Pulmonary Rehab  Date  09/18/17  Educator  AS  Instruction Review Code  1- Verbalizes Understanding      Stress and Anxiety: - Provides group verbal and written instruction about the health risks of elevated stress and causes of high stress.  Discuss the correlation between heart/lung disease and anxiety and treatment options. Review healthy ways to manage with stress and anxiety.   Cardiac Rehab from 10/21/2017 in [facility] Cardiac and Pulmonary Rehab  Date  09/25/17  Educator  [facility]  Instruction Review Code  1- Verbalizes Understanding      Depression: - Provides group verbal and written instruction on the correlation between heart/lung disease and depressed mood, treatment options, and the stigmas associated with seeking  treatment.   Anatomy & Physiology of the Heart: - Group verbal and written instruction and models provide basic cardiac anatomy and physiology, with the coronary electrical and arterial systems. Review of Valvular disease and Heart Failure   Cardiac Rehab from 10/21/2017 in [facility] Cardiac and Pulmonary Rehab  Date  10/07/17  Educator  CE  Instruction Review Code  1- Verbalizes Understanding      Cardiac Procedures: - Group verbal and written instruction to review commonly prescribed medications for heart disease. Reviews the medication, class of the drug, and side effects. Includes the steps to properly store meds and maintain the prescription regimen. (beta blockers and nitrates)   Cardiac Rehab from 10/21/2017 in [facility] Cardiac and Pulmonary Rehab  Date  10/16/17  Educator  [facility]  Instruction Review Code  1- Verbalizes Understanding      Cardiac Medications I: - Group verbal and written instruction to review commonly prescribed medications for heart disease. Reviews the medication, class of the drug, and side effects. Includes the steps to properly store meds and maintain the prescription regimen.   Cardiac Rehab from 10/21/2017 in [facility] Cardiac and Pulmonary Rehab  Date  09/30/17  Educator  CE  Instruction Review Code  1- Verbalizes Understanding      Cardiac Medications II: -Group verbal and written instruction to review commonly prescribed medications for heart disease. Reviews the medication, class of the drug, and side effects. (all other drug classes)   Cardiac Rehab from 10/21/2017 in [facility] Cardiac and Pulmonary Rehab  Date  09/23/17  Educator  CE  Instruction Review Code  1- Verbalizes Understanding       Go Sex-Intimacy & Heart Disease, Get SMART - Goal Setting: - Group verbal and written instruction through game format to discuss heart disease and the return to sexual intimacy. Provides group verbal and written material to discuss and apply goal setting through the application of  the S.M.A.R.T. Method.   Cardiac Rehab from 10/21/2017 in [facility] Cardiac and Pulmonary Rehab  Date  10/16/17  Educator  [facility]  Instruction Review Code  1- Verbalizes Understanding      Other Matters of the Heart: - Provides group verbal, written materials and models to  describe Stable Angina and Peripheral Artery. Includes description of the disease process and treatment options available to the cardiac patient.   Exercise & Equipment Safety: - Individual verbal instruction and demonstration of equipment use and safety with use of the equipment.   Cardiac Rehab from 10/21/2017 in [facility] Cardiac and Pulmonary Rehab  Date  08/01/17  Educator  [facility]  Instruction Review Code  1- Verbalizes Understanding      Infection Prevention: - Provides verbal and written material to individual with discussion of infection control including proper hand washing and proper equipment cleaning during exercise session.   Cardiac Rehab from 10/21/2017 in [facility] Cardiac and Pulmonary Rehab  Date  08/01/17  Educator  [facility]  Instruction Review Code  1- Verbalizes Understanding      Falls Prevention: - Provides verbal and written material to individual with discussion of falls prevention and safety.   Cardiac Rehab from 10/21/2017 in [facility] Cardiac and Pulmonary Rehab  Date  08/01/17  Educator  [facility]  Instruction Review Code  1- Verbalizes Understanding      Diabetes: - Individual verbal and written instruction to review signs/symptoms of diabetes, desired ranges of glucose level fasting, after meals and with exercise. Acknowledge that pre and post exercise glucose checks will be done for 3 sessions at entry of program.   Know Your Numbers and Risk Factors: -Group verbal and written instruction about important numbers in your health.  Discussion of what are risk factors and how they play a role in the disease process.  Review of Cholesterol, Blood Pressure, Diabetes, and BMI and the role they play in your overall health.    Cardiac Rehab from 10/21/2017 in [facility] Cardiac and Pulmonary Rehab  Date  09/23/17  Educator  CE  Instruction Review Code  1- Verbalizes Understanding      Sleep Hygiene: -Provides group verbal and written instruction about how sleep can affect your health.  Define sleep hygiene, discuss sleep cycles and impact of sleep habits. Review good sleep hygiene tips.    Cardiac Rehab from 10/21/2017 in [facility] Cardiac and Pulmonary Rehab  Date  10/09/17  Educator  Lucianne Lei, MSW  Instruction Review Code  2- Demonstrated Understanding      Other: -Provides group and verbal instruction on various topics (see comments)   Knowledge Questionnaire Score: Knowledge Questionnaire Score - 08/01/17 1413      Knowledge Questionnaire Score   Pre Score  18/28 correct answers reviewed with Simona Huh       Core Components/Risk Factors/Patient Goals at Admission: Personal Goals and Risk Factors at Admission - 08/01/17 1354      Core Components/Risk Factors/Patient Goals on Admission    Weight Management  Yes;Obesity;Weight Loss    Intervention  Weight Management: Develop a combined nutrition and exercise program designed to reach desired caloric intake, while maintaining appropriate intake of nutrient and fiber, sodium and fats, and appropriate energy expenditure required for the weight goal.;Weight Management: Provide education and appropriate resources to help participant work on and attain dietary goals.;Weight Management/Obesity: Establish reasonable short term and long term weight goals.;Obesity: Provide education and appropriate resources to help participant work on and attain dietary goals.    Admit Weight  179 lb 3.2 oz (81.3 kg)    Goal Weight: Short Term  174 lb (78.9 kg)    Goal Weight: Long Term  165 lb (74.8 kg)    Expected Outcomes  Short Term: Continue to assess and modify interventions until short term weight is achieved;Long Term: Adherence  to nutrition and physical activity/exercise program  aimed toward attainment of established weight goal;Weight Loss: Understanding of general recommendations for a balanced deficit meal plan, which promotes 1-2 lb weight loss per week and includes a negative energy balance of [phone] kcal/d;Understanding recommendations for meals to include 15-35% energy as protein, 25-35% energy from fat, 35-60% energy from carbohydrates, less than 265m of dietary cholesterol, 20-35 gm of total fiber daily;Understanding of distribution of calorie intake throughout the day with the consumption of 4-5 meals/snacks    Hypertension  Yes    Intervention  Provide education on lifestyle modifcations including regular physical activity/exercise, weight management, moderate sodium restriction and increased consumption of fresh fruit, vegetables, and low fat dairy, alcohol moderation, and smoking cessation.;Monitor prescription use compliance.    Expected Outcomes  Short Term: Continued assessment and intervention until BP is < 140/929mHG in hypertensive participants. < 130/8036mG in hypertensive participants with diabetes, heart failure or chronic kidney disease.;Long Term: Maintenance of blood pressure at goal levels.    Lipids  Yes    Intervention  Provide education and support for participant on nutrition & aerobic/resistive exercise along with prescribed medications to achieve LDL <29m49mDL >40mg61m Expected Outcomes  Short Term: Participant states understanding of desired cholesterol values and is compliant with medications prescribed. Participant is following exercise prescription and nutrition guidelines.;Long Term: Cholesterol controlled with medications as prescribed, with individualized exercise RX and with personalized nutrition plan. Value goals: LDL < 29mg,27m > 40 mg.    Stress  Yes DennisSiddiqressed about having a heart attack. He says he has never had any type of heart issues before, so this is all new.    Intervention  Offer individual and/or small group  education and counseling on adjustment to heart disease, stress management and health-related lifestyle change. Teach and support self-help strategies.;Refer participants experiencing significant psychosocial distress to appropriate mental health specialists for further evaluation and treatment. When possible, include family members and significant others in education/counseling sessions.    Expected Outcomes  Short Term: Participant demonstrates changes in health-related behavior, relaxation and other stress management skills, ability to obtain effective social support, and compliance with psychotropic medications if prescribed.;Long Term: Emotional wellbeing is indicated by absence of clinically significant psychosocial distress or social isolation.       Core Components/Risk Factors/Patient Goals Review:  Goals and Risk Factor Review    Row Name 09/30/17 1623             Core Components/Risk Factors/Patient Goals Review   Personal Goals Review  Weight Management/Obesity;Lipids;Hypertension;Improve shortness of breath with ADL's       Review  DennisDiagoad good BP readings and is taking all meds as directed.  He has had SOB and has spoken to his Dr - who believes its from [place]ens at night or when he is sitting still.  Follow up on this symtpom with Dr.  It doesnt happen with exercise or other activities.         Expected Outcomes  Short - DennisNicholaiontinue to exrecise and take meds Long - DennisDaymeinmaintain exercise          Core Components/Risk Factors/Patient Goals at Discharge (Final Review):  Goals and Risk Factor Review - 09/30/17 1623      Core Components/Risk Factors/Patient Goals Review   Personal Goals Review  Weight Management/Obesity;Lipids;Hypertension;Improve shortness of breath with ADL's    Review  DennisNivanad good BP readings  and is taking all meds as directed.  He has had SOB and has spoken to his Dr - who believes its from [place] - it happens at  night or when he is sitting still.  Follow up on this symtpom with Dr.  It doesnt happen with exercise or other activities.      Expected Outcomes  Short - Giancarlos wil continue to exrecise and take meds Long - Abshir will maintain exercise       ITP Comments: ITP Comments    Row Name 08/01/17 1344 08/21/17 0638 08/28/17 1212 09/12/17 1646 09/18/17 0602   ITP Comments  Med review completed. Initial ITP created. Diagnosis can be found in [facility] 07/20/17  30 Day review. Continue with ITP unless directed changes per Medical Director review.  New to program.  1/28 had another coronary stent placed, will wait for medical clearance to return to the program  Janelle had a recent cath and Dr Fletcher Anon wants to wait until the next plan of treatment is decided before he continues exercise.  Returns today, no med changes after third stent staged and completed  30 day review. Continue with ITP unless directed changes per Medical Director review.    1 visit this month   Row Name 09/30/17 1731 10/16/17 0650         ITP Comments  Dietician appointment scheduled for October 17, 2017.   30 Day review. Continue with ITP unless directed changes per Medical Director review.           Comments:

## 2017-10-22 NOTE — Progress Notes (Signed)
Discharge Progress Report  Patient Details  Name: Todd Weaver MRN: [id number] Date of Birth: [birth year] Referring Provider:     Cardiac Rehab from 08/01/2017 in [facility]  Referring Provider  Farnam       Number of Visits:  36/36  Reason for Discharge:  Patient reached a stable level of exercise. Patient independent in their exercise. Patient has met program and personal goals.    Smoking History:  Social History   Tobacco Use  Smoking Status Former Smoker  . Last attempt to quit: 07/24/2007  . Years since quitting: 10.2  Smokeless Tobacco Never Used    Diagnosis:  ST elevation myocardial infarction (STEMI), unspecified artery (HCC)  Status post coronary artery stent placement  ADL [facility]:   Initial Exercise Prescription: Initial Exercise Prescription - 08/01/17 1400      Date of Initial Exercise RX and Referring Provider   Date  08/01/17    Referring Provider  Arida      Treadmill   MPH  2.8    Grade  1    Minutes  15    METs  3.5      Recumbant Bike   Level  3    RPM  60    Watts  40    Minutes  15    METs  3.5      NuStep   Level  3    SPM  80    Minutes  15    METs  3.5      REL-XR   Level  3    Speed  50    Minutes  15    METs  3.5      Prescription Details   Frequency (times per week)  3    Duration  Progress to 45 minutes of aerobic exercise without signs/symptoms of physical distress      Intensity   THRR 40-80% of Max Heartrate  97-137    Ratings of Perceived Exertion  11-13    Perceived Dyspnea  0-4      Resistance Training   Training Prescription  Yes    Weight  3 lb    Reps  10-15       Discharge Exercise Prescription (Final Exercise Prescription Changes): Exercise Prescription Changes - 10/09/17 1400      Response to Exercise   Blood Pressure (Admit)  134/64    Blood Pressure (Exercise)  146/64    Blood Pressure (Exit)  142/60    Heart Rate (Admit)  69 bpm    Heart Rate (Exercise)  111 bpm     Heart Rate (Exit)  67 bpm    Rating of Perceived Exertion (Exercise)  13    Symptoms  none    Duration  Continue with 45 min of aerobic exercise without signs/symptoms of physical distress.    Intensity  THRR unchanged      Progression   Progression  Continue to progress workloads to maintain intensity without signs/symptoms of physical distress.    Average METs  4.9      Resistance Training   Training Prescription  Yes    Weight  3 lb    Reps  10-15      Treadmill   MPH  2.8    Grade  1    Minutes  15    METs  3.5      NuStep   Level  4    SPM  80  Minutes  15    METs  4.5      Home Exercise Plan   Plans to continue exercise at  [facility] (comment)    Frequency  Add 1 additional day to program exercise sessions.    Initial Home Exercises Provided  09/26/17       Functional Capacity: 6 Minute Walk    Row Name 08/01/17 1446 10/17/17 1632       6 Minute Walk   Phase  -  Discharge    Distance  1475 feet  1675 feet    Distance % Change  -  13 %    Distance Feet Change  -  200 ft    Walk Time  6 minutes  6 minutes    # of Rest Breaks  0  0    MPH  2.8  3.17    METS  3.64  3.95    RPE  11  13    Perceived Dyspnea   0  0    VO2 Peak  12.73  13.83    Symptoms  No  Yes (comment)    Comments  -  calves "burning" 8/10     Resting HR  59 bpm  59 bpm    Resting BP  118/66  134/64    Resting Oxygen Saturation   92 %  98 %    Exercise Oxygen Saturation  during 6 min walk  99 %  99 %    Max Ex. HR  114 bpm  100 bpm    Max Ex. BP  128/66  152/76    2 Minute Post BP  112/66  -       Psychological, QOL, Others - Outcomes: PHQ 2/9: Depression screen [facility] 2/9 08/01/2017 06/19/2017  Decreased Interest 0 0  Down, Depressed, Hopeless 0 0  PHQ - 2 Score 0 0  Altered sleeping 1 -  Tired, decreased energy 1 -  Change in appetite 0 -  Feeling bad or failure about yourself  0 -  Trouble concentrating 0 -  Moving slowly or fidgety/restless 0 -  Suicidal  thoughts 0 -  PHQ-9 Score 2 -  Difficult doing work/chores Not difficult at all -    Quality of Life: Quality of Life - 08/01/17 1412      Quality of Life Scores   Health/Function Pre  20.73 %    Socioeconomic Pre  20.88 %    Psych/Spiritual Pre  21 %    Family Pre  21 %    GLOBAL Pre  20.86 %       Personal Goals: Goals established at orientation with interventions provided to work toward goal. Personal Goals and Risk Factors at Admission - 08/01/17 1354      Core Components/Risk Factors/Patient Goals on Admission    Weight Management  Yes;Obesity;Weight Loss    Intervention  Weight Management: Develop a combined nutrition and exercise program designed to reach desired caloric intake, while maintaining appropriate intake of nutrient and fiber, sodium and fats, and appropriate energy expenditure required for the weight goal.;Weight Management: Provide education and appropriate resources to help participant work on and attain dietary goals.;Weight Management/Obesity: Establish reasonable short term and long term weight goals.;Obesity: Provide education and appropriate resources to help participant work on and attain dietary goals.    Admit Weight  179 lb 3.2 oz (81.3 kg)    Goal Weight: Short Term  174 lb (78.9 kg)    Goal Weight: Long Term  165 lb (74.8 kg)    Expected Outcomes  Short Term: Continue to assess and modify interventions until short term weight is achieved;Long Term: Adherence to nutrition and physical activity/exercise program aimed toward attainment of established weight goal;Weight Loss: Understanding of general recommendations for a balanced deficit meal plan, which promotes 1-2 lb weight loss per week and includes a negative energy balance of [id number] kcal/d;Understanding recommendations for meals to include 15-35% energy as protein, 25-35% energy from fat, 35-60% energy from carbohydrates, less than 292m of dietary cholesterol, 20-35 gm of total fiber  daily;Understanding of distribution of calorie intake throughout the day with the consumption of 4-5 meals/snacks    Hypertension  Yes    Intervention  Provide education on lifestyle modifcations including regular physical activity/exercise, weight management, moderate sodium restriction and increased consumption of fresh fruit, vegetables, and low fat dairy, alcohol moderation, and smoking cessation.;Monitor prescription use compliance.    Expected Outcomes  Short Term: Continued assessment and intervention until BP is < 140/927mHG in hypertensive participants. < 130/8055mG in hypertensive participants with diabetes, heart failure or chronic kidney disease.;Long Term: Maintenance of blood pressure at goal levels.    Lipids  Yes    Intervention  Provide education and support for participant on nutrition & aerobic/resistive exercise along with prescribed medications to achieve LDL <37m2mDL >40mg94m Expected Outcomes  Short Term: Participant states understanding of desired cholesterol values and is compliant with medications prescribed. Participant is following exercise prescription and nutrition guidelines.;Long Term: Cholesterol controlled with medications as prescribed, with individualized exercise RX and with personalized nutrition plan. Value goals: LDL < 37mg,27m > 40 mg.    Stress  Yes DennisLinconressed about having a heart attack. He says he has never had any type of heart issues before, so this is all new.    Intervention  Offer individual and/or small group education and counseling on adjustment to heart disease, stress management and health-related lifestyle change. Teach and support self-help strategies.;Refer participants experiencing significant psychosocial distress to appropriate mental health specialists for further evaluation and treatment. When possible, include family members and significant others in education/counseling sessions.    Expected Outcomes  Short Term: Participant  demonstrates changes in health-related behavior, relaxation and other stress management skills, ability to obtain effective social support, and compliance with psychotropic medications if prescribed.;Long Term: Emotional wellbeing is indicated by absence of clinically significant psychosocial distress or social isolation.        Personal Goals Discharge: Goals and Risk Factor Review    Row Name 09/30/17 1623             Core Components/Risk Factors/Patient Goals Review   Personal Goals Review  Weight Management/Obesity;Lipids;Hypertension;Improve shortness of breath with ADL's       Review  DennisChampad good BP readings and is taking all meds as directed.  He has had SOB and has spoken to his Dr - who believes its from [place]ilin[place]oma Hill[place]appens at night or when he is sitting still.  Follow up on this symtpom with Dr.  It doesnt happen with exercise or other activities.         Expected Outcomes  Short - DennisUstinontinue to exrecise and take meds Long - DennisQuintanmaintain exercise          Exercise Goals and Review: Exercise Goals    Row Name 08/01/17 1443             Exercise Goals  Increase Physical Activity  Yes       Intervention  Provide advice, education, support and counseling about physical activity/exercise needs.;Develop an individualized exercise prescription for aerobic and resistive training based on initial evaluation findings, risk stratification, comorbidities and participant's personal goals.       Expected Outcomes  Achievement of increased cardiorespiratory fitness and enhanced flexibility, muscular endurance and strength shown through measurements of functional capacity and personal statement of participant.       Increase Strength and Stamina  Yes       Intervention  Provide advice, education, support and counseling about physical activity/exercise needs.;Develop an individualized exercise prescription for aerobic and resistive training based on initial  evaluation findings, risk stratification, comorbidities and participant's personal goals.       Expected Outcomes  Achievement of increased cardiorespiratory fitness and enhanced flexibility, muscular endurance and strength shown through measurements of functional capacity and personal statement of participant.       Able to understand and use rate of perceived exertion (RPE) scale  Yes       Intervention  Provide education and explanation on how to use RPE scale       Expected Outcomes  Short Term: Able to use RPE daily in rehab to express subjective intensity level;Long Term:  Able to use RPE to guide intensity level when exercising independently       Able to understand and use Dyspnea scale  Yes       Intervention  Provide education and explanation on how to use Dyspnea scale       Expected Outcomes  Short Term: Able to use Dyspnea scale daily in rehab to express subjective sense of shortness of breath during exertion;Long Term: Able to use Dyspnea scale to guide intensity level when exercising independently       Knowledge and understanding of Target Heart Rate Range (THRR)  Yes       Intervention  Provide education and explanation of THRR including how the numbers were predicted and where they are located for reference       Expected Outcomes  Short Term: Able to state/look up THRR;Short Term: Able to use daily as guideline for intensity in rehab;Long Term: Able to use THRR to govern intensity when exercising independently       Able to check pulse independently  Yes       Intervention  Provide education and demonstration on how to check pulse in carotid and radial arteries.;Review the importance of being able to check your own pulse for safety during independent exercise       Expected Outcomes  Short Term: Able to explain why pulse checking is important during independent exercise;Long Term: Able to check pulse independently and accurately       Understanding of Exercise Prescription  Yes        Intervention  Provide education, explanation, and written materials on patient's individual exercise prescription       Expected Outcomes  Short Term: Able to explain program exercise prescription;Long Term: Able to explain home exercise prescription to exercise independently          Nutrition & Weight - Outcomes: Pre Biometrics - 08/01/17 1442      Pre Biometrics   Height  5' 7.5" (1.715 m)    Weight  179 lb 3.2 oz (81.3 kg)    Waist Circumference  38.25 inches    Hip Circumference  39.5 inches    Waist to Hip Ratio  0.97 %  BMI (Calculated)  27.64    Single Leg Stand  30 seconds      Post Biometrics - 10/17/17 1631       Post  Biometrics   Height  5' 7.5" (1.715 m)    Weight  188 lb (85.3 kg)    Waist Circumference  38.25 inches    Hip Circumference  39.5 inches    Waist to Hip Ratio  0.97 %    BMI (Calculated)  28.99    Single Leg Stand  12 seconds       Nutrition: Nutrition Therapy & Goals - 08/01/17 1401      Intervention Plan   Intervention  Prescribe, educate and counsel regarding individualized specific dietary modifications aiming towards targeted core components such as weight, hypertension, lipid management, diabetes, heart failure and other comorbidities.;Nutrition handout(s) given to patient.    Expected Outcomes  Short Term Goal: Understand basic principles of dietary content, such as calories, fat, sodium, cholesterol and nutrients.;Short Term Goal: A plan has been developed with personal nutrition goals set during dietitian appointment.;Long Term Goal: Adherence to prescribed nutrition plan.       Nutrition Discharge: Nutrition Assessments - 08/01/17 1402      MEDFICTS Scores   Pre Score  16       Education Questionnaire Score: Knowledge Questionnaire Score - 08/01/17 1413      Knowledge Questionnaire Score   Pre Score  18/28 correct answers reviewed with Simona Huh       Goals reviewed with patient; copy given to patient.

## 2017-10-23 NOTE — Progress Notes (Signed)
Cardiac Individual Treatment Plan  Patient Details  Name: TANK DIFIORE MRN: [id number] Date of Birth: 08-16-1950 Referring Provider:     Cardiac Rehab from 08/01/2017 in [facility] Cardiac and Pulmonary Rehab  Referring Provider  Arida      Initial Encounter Date:    Cardiac Rehab from 08/01/2017 in [facility] Cardiac and Pulmonary Rehab  Date  08/01/17  Referring Provider  Fletcher Anon      Visit Diagnosis: ST elevation myocardial infarction (STEMI), unspecified artery [facility])  Status post coronary artery stent placement  Patient's Home Medications on Admission:  Current Outpatient Medications:  .  aspirin EC 81 MG tablet, Take 81 mg by mouth daily. , Disp: , Rfl:  .  atorvastatin (LIPITOR) 80 MG tablet, Take 1 tablet (80 mg total) by mouth daily at 6 PM., Disp: 90 tablet, Rfl: 3 .  clopidogrel (PLAVIX) 75 MG tablet, Take 300 mg (4 tablets) by mouth the first day, then take 75 mg (1 tablet) by mouth once a day thereafter., Disp: 90 tablet, Rfl: 3 .  ezetimibe (ZETIA) 10 MG tablet, Take 1 tablet (10 mg total) by mouth daily., Disp: 90 tablet, Rfl: 3 .  losartan (COZAAR) 25 MG tablet, Take 1 tablet (25 mg total) by mouth daily., Disp: 30 tablet, Rfl: 2 .  metoprolol succinate (TOPROL-XL) 25 MG 24 hr tablet, Take 0.5 tablets (12.5 mg total) by mouth daily., Disp: 30 tablet, Rfl: 2 .  nitroGLYCERIN (NITROSTAT) 0.4 MG SL tablet, Place 1 tablet (0.4 mg total) under the tongue every 5 (five) minutes as needed for chest pain., Disp: 30 tablet, Rfl: 0 .  tamsulosin (FLOMAX) 0.4 MG CAPS capsule, Take 1 capsule (0.4 mg total) by mouth daily., Disp: 90 capsule, Rfl: 3  Past Medical History: Past Medical History:  Diagnosis Date  . Acute ST elevation myocardial infarction (STEMI) of inferior wall ([place]) 07/18/2017  . CAD (coronary artery disease)    a. nuc stress test 9/17: small defect of mild severity in the apex location, partially reversible and suspected to be due to artifact given intense GI uptake, EF  55-65%, low risk study; b. inferior STEMI 07/18/17  . GERD (gastroesophageal reflux disease)   . Hemorrhoid   . Hyperlipidemia   . Hypertension   . Stroke [facility]) 2011   "2 MINI STROKES"    Tobacco Use: Social History   Tobacco Use  Smoking Status Former Smoker  . Last attempt to quit: 07/24/2007  . Years since quitting: 10.2  Smokeless Tobacco Never Used    Labs: Recent Review Scientist, physiological    Labs for ITP Cardiac and Pulmonary Rehab Latest Ref Rng & Units 06/19/2017 07/17/2017 07/18/2017 09/24/2017   Cholestrol 100 - 199 mg/dL 235(H) 223(H) 216(H) 164   LDLCALC 0 - 99 mg/dL 155(H) 130(H) 129(H) 102(H)   LDLDIRECT 0 - 99 mg/dL - - - 116(H)   HDL >39 mg/dL 61 73 66 42   Trlycerides 0 - 149 mg/dL 93 99 106 99   Hemoglobin A1c 4.8 - 5.6 % - - 5.8(H) -       Exercise Target Goals:    Exercise Program Goal: Individual exercise prescription set using results from initial 6 min walk test and THRR while considering  patient's activity barriers and safety.   Exercise Prescription Goal: Initial exercise prescription builds to 30-45 minutes a day of aerobic activity, 2-3 days per week.  Home exercise guidelines will be given to patient during program as part of exercise prescription that the participant will acknowledge.  Activity Barriers & Risk Stratification: Activity Barriers & Cardiac Risk Stratification - 08/01/17 1415      Activity Barriers & Cardiac Risk Stratification   Activity Barriers  Back Problems;Shortness of Breath back surgery May 2018    Cardiac Risk Stratification  Moderate       6 Minute Walk: 6 Minute Walk    Row Name 08/01/17 1446 10/17/17 1632       6 Minute Walk   Phase  -  Discharge    Distance  1475 feet  1675 feet    Distance % Change  -  13 %    Distance Feet Change  -  200 ft    Walk Time  6 minutes  6 minutes    # of Rest Breaks  0  0    MPH  2.8  3.17    METS  3.64  3.95    RPE  11  13    Perceived Dyspnea   0  0    VO2 Peak  12.73   13.83    Symptoms  No  Yes (comment)    Comments  -  calves "burning" 8/10     Resting HR  59 bpm  59 bpm    Resting BP  118/66  134/64    Resting Oxygen Saturation   92 %  98 %    Exercise Oxygen Saturation  during 6 min walk  99 %  99 %    Max Ex. HR  114 bpm  100 bpm    Max Ex. BP  128/66  152/76    2 Minute Post BP  112/66  -       Oxygen Initial Assessment:   Oxygen Re-Evaluation:   Oxygen Discharge (Final Oxygen Re-Evaluation):   Initial Exercise Prescription: Initial Exercise Prescription - 08/01/17 1400      Date of Initial Exercise RX and Referring Provider   Date  08/01/17    Referring Provider  Arida      Treadmill   MPH  2.8    Grade  1    Minutes  15    METs  3.5      Recumbant Bike   Level  3    RPM  60    Watts  40    Minutes  15    METs  3.5      NuStep   Level  3    SPM  80    Minutes  15    METs  3.5      REL-XR   Level  3    Speed  50    Minutes  15    METs  3.5      Prescription Details   Frequency (times per week)  3    Duration  Progress to 45 minutes of aerobic exercise without signs/symptoms of physical distress      Intensity   THRR 40-80% of Max Heartrate  97-137    Ratings of Perceived Exertion  11-13    Perceived Dyspnea  0-4      Resistance Training   Training Prescription  Yes    Weight  3 lb    Reps  10-15       Perform Capillary Blood Glucose checks as needed.  Exercise Prescription Changes: Exercise Prescription Changes    Row Name 08/01/17 1400 09/25/17 1300 09/26/17 1700 10/09/17 1400       Response to Exercise   Blood Pressure (Admit)  118/66  138/68  -  134/64    Blood Pressure (Exercise)  128/66  130/70  -  146/64    Blood Pressure (Exit)  -  120/78  -  142/60    Heart Rate (Admit)  59 bpm  60 bpm  -  69 bpm    Heart Rate (Exercise)  114 bpm  111 bpm  -  111 bpm    Heart Rate (Exit)  57 bpm  64 bpm  -  67 bpm    Oxygen Saturation (Admit)  92 %  -  -  -    Oxygen Saturation (Exit)  99 %  -  -  -     Rating of Perceived Exertion (Exercise)  11  -  -  13    Symptoms  -  none  -  none    Duration  -  Continue with 45 min of aerobic exercise without signs/symptoms of physical distress.  -  Continue with 45 min of aerobic exercise without signs/symptoms of physical distress.    Intensity  -  THRR unchanged  -  THRR unchanged      Progression   Progression  -  Continue to progress workloads to maintain intensity without signs/symptoms of physical distress.  Continue to progress workloads to maintain intensity without signs/symptoms of physical distress.  Continue to progress workloads to maintain intensity without signs/symptoms of physical distress.    Average METs  -  4.6  4.6  4.9      Resistance Training   Training Prescription  -  Yes  Yes  Yes    Weight  -  3 lb  3 lb  3 lb    Reps  -  10-15  10-15  10-15      Treadmill   MPH  -  2.8  2.8  2.8    Grade  -  _0 Minutes  -  _1 METs  -  3.5  3.5  3.5      NuStep   Level  -  -  -  4    SPM  -  -  -  80    Minutes  -  -  -  15    METs  -  -  -  4.5      REL-XR   Level  -  3  3  -    Speed  -  50  50  -    Minutes  -  15  15  -    METs  -  5.7  5.7  -      Home Exercise Plan   Plans to continue exercise at  -  -  [employer] (comment)  Forensic scientist (comment)    Frequency  -  -  Add 1 additional day to program exercise sessions.  Add 1 additional day to program exercise sessions.    Initial Home Exercises Provided  -  -  09/26/17  09/26/17       Exercise Comments: Exercise Comments    Row Name 08/07/17 1725           Exercise Comments  First full day of exercise!  Patient was oriented to gym and equipment including functions, settings, policies, and procedures.  Patient's individual exercise prescription and treatment plan were reviewed.  All starting workloads were established based on the results of the  6 minute walk test done at initial orientation visit.  The plan for exercise  progression was also introduced and progression will be customized based on patient's performance and goals          Exercise Goals and Review: Exercise Goals    Row Name 08/01/17 1443             Exercise Goals   Increase Physical Activity  Yes       Intervention  Provide advice, education, support and counseling about physical activity/exercise needs.;Develop an individualized exercise prescription for aerobic and resistive training based on initial evaluation findings, risk stratification, comorbidities and participant's personal goals.       Expected Outcomes  Achievement of increased cardiorespiratory fitness and enhanced flexibility, muscular endurance and strength shown through measurements of functional capacity and personal statement of participant.       Increase Strength and Stamina  Yes       Intervention  Provide advice, education, support and counseling about physical activity/exercise needs.;Develop an individualized exercise prescription for aerobic and resistive training based on initial evaluation findings, risk stratification, comorbidities and participant's personal goals.       Expected Outcomes  Achievement of increased cardiorespiratory fitness and enhanced flexibility, muscular endurance and strength shown through measurements of functional capacity and personal statement of participant.       Able to understand and use rate of perceived exertion (RPE) scale  Yes       Intervention  Provide education and explanation on how to use RPE scale       Expected Outcomes  Short Term: Able to use RPE daily in rehab to express subjective intensity level;Long Term:  Able to use RPE to guide intensity level when exercising independently       Able to understand and use Dyspnea scale  Yes       Intervention  Provide education and explanation on how to use Dyspnea scale       Expected Outcomes  Short Term: Able to use Dyspnea scale daily in rehab to express subjective sense of  shortness of breath during exertion;Long Term: Able to use Dyspnea scale to guide intensity level when exercising independently       Knowledge and understanding of Target Heart Rate Range (THRR)  Yes       Intervention  Provide education and explanation of THRR including how the numbers were predicted and where they are located for reference       Expected Outcomes  Short Term: Able to state/look up THRR;Short Term: Able to use daily as guideline for intensity in rehab;Long Term: Able to use THRR to govern intensity when exercising independently       Able to check pulse independently  Yes       Intervention  Provide education and demonstration on how to check pulse in carotid and radial arteries.;Review the importance of being able to check your own pulse for safety during independent exercise       Expected Outcomes  Short Term: Able to explain why pulse checking is important during independent exercise;Long Term: Able to check pulse independently and accurately       Understanding of Exercise Prescription  Yes       Intervention  Provide education, explanation, and written materials on patient's individual exercise prescription       Expected Outcomes  Short Term: Able to explain program exercise prescription;Long Term: Able to explain home exercise prescription to exercise independently  Exercise Goals Re-Evaluation : Exercise Goals Re-Evaluation    Row Name 08/07/17 1725 09/25/17 1334 09/26/17 1709 10/09/17 1441       Exercise Goal Re-Evaluation   Exercise Goals Review  Able to understand and use rate of perceived exertion (RPE) scale;Knowledge and understanding of Target Heart Rate Range (THRR);Understanding of Exercise Prescription  Increase Physical Activity;Able to understand and use rate of perceived exertion (RPE) scale;Knowledge and understanding of Target Heart Rate Range (THRR);Increase Strength and Stamina  Increase Physical Activity;Understanding of Exercise  Prescription;Increase Strength and Stamina;Knowledge and understanding of Target Heart Rate Range (THRR);Able to understand and use rate of perceived exertion (RPE) scale;Able to check pulse independently  Increase Physical Activity;Increase Strength and Stamina;Able to understand and use rate of perceived exertion (RPE) scale;Knowledge and understanding of Target Heart Rate Range (THRR);Able to understand and use Dyspnea scale    Comments  Reviewed RPE scale, THR and program prescription with pt today.  Pt voiced understanding and was given a copy of goals to take home.   Pt is tolerating exercise well.  He is reaching THR range.  Staff will continue to monitor.  Home excerise guidelines reviewed with patient, handout given and signed  Hollis is making great progress and his average MET level is 4.9.  He has increased the difficulty of his strength exercise by doing more advanced options for some exercises.  Staff will work with Simona Huh to add intervals to his program    Expected Outcomes  Short: Use RPE daily to regulate intensity.  Long: Follow program prescription in THR.  Short - Pt will attend classes regularly Long - pt will continue to increase MET level  Short: Davidson will add one extra day of exercise outside the programLong: become independent with exercise.   Short -  Kolton will add  intervals to the XR and NS.  Long - Alecxander will continue to increase MET level       Discharge Exercise Prescription (Final Exercise Prescription Changes): Exercise Prescription Changes - 10/09/17 1400      Response to Exercise   Blood Pressure (Admit)  134/64    Blood Pressure (Exercise)  146/64    Blood Pressure (Exit)  142/60    Heart Rate (Admit)  69 bpm    Heart Rate (Exercise)  111 bpm    Heart Rate (Exit)  67 bpm    Rating of Perceived Exertion (Exercise)  13    Symptoms  none    Duration  Continue with 45 min of aerobic exercise without signs/symptoms of physical distress.    Intensity  THRR  unchanged      Progression   Progression  Continue to progress workloads to maintain intensity without signs/symptoms of physical distress.    Average METs  4.9      Resistance Training   Training Prescription  Yes    Weight  3 lb    Reps  10-15      Treadmill   MPH  2.8    Grade  1    Minutes  15    METs  3.5      NuStep   Level  4    SPM  80    Minutes  15    METs  4.5      Home Exercise Plan   Plans to continue exercise at  [facility] (comment)    Frequency  Add 1 additional day to program exercise sessions.    Initial Home Exercises Provided  09/26/17  Nutrition:  Target Goals: Understanding of nutrition guidelines, daily intake of sodium <1529m, cholesterol <204m calories 30% from fat and 7% or less from saturated fats, daily to have 5 or more servings of fruits and vegetables.  Biometrics: Pre Biometrics - 08/01/17 1442      Pre Biometrics   Height  5' 7.5" (1.715 m)    Weight  179 lb 3.2 oz (81.3 kg)    Waist Circumference  38.25 inches    Hip Circumference  39.5 inches    Waist to Hip Ratio  0.97 %    BMI (Calculated)  27.64    Single Leg Stand  30 seconds      Post Biometrics - 10/17/17 1631       Post  Biometrics   Height  5' 7.5" (1.715 m)    Weight  188 lb (85.3 kg)    Waist Circumference  38.25 inches    Hip Circumference  39.5 inches    Waist to Hip Ratio  0.97 %    BMI (Calculated)  28.99    Single Leg Stand  12 seconds       Nutrition Therapy Plan and Nutrition Goals: Nutrition Therapy & Goals - 08/01/17 1401      Intervention Plan   Intervention  Prescribe, educate and counsel regarding individualized specific dietary modifications aiming towards targeted core components such as weight, hypertension, lipid management, diabetes, heart failure and other comorbidities.;Nutrition handout(s) given to patient.    Expected Outcomes  Short Term Goal: Understand basic principles of dietary content, such as calories, fat, sodium,  cholesterol and nutrients.;Short Term Goal: A plan has been developed with personal nutrition goals set during dietitian appointment.;Long Term Goal: Adherence to prescribed nutrition plan.       Nutrition Assessments: Nutrition Assessments - 10/23/17 1409      MEDFICTS Scores   Pre Score  16    Post Score  58    Score Difference  42       Nutrition Goals Re-Evaluation: Nutrition Goals Re-Evaluation    Row Name 09/30/17 1629             Goals   Comment  DeYassenould like to meet with RD.  Staff will schedule.  He has made some changes including less red meat, and more vegetables.       Expected Outcome  Short - DeKingsleeill meet with RD to get a more comprehensive plan Long - DeDreseanill reach weight goals and keep heart healthy diet          Nutrition Goals Discharge (Final Nutrition Goals Re-Evaluation): Nutrition Goals Re-Evaluation - 09/30/17 1629      Goals   Comment  DeAdeelould like to meet with RD.  Staff will schedule.  He has made some changes including less red meat, and more vegetables.    Expected Outcome  Short - DeDabidill meet with RD to get a more comprehensive plan Long - DeQuayshaunill reach weight goals and keep heart healthy diet       Psychosocial: Target Goals: Acknowledge presence or absence of significant depression and/or stress, maximize coping skills, provide positive support system. Participant is able to verbalize types and ability to use techniques and skills needed for reducing stress and depression.   Initial Review & Psychosocial Screening: Initial Psych Review & Screening - 08/01/17 1405      Initial Review   Current issues with  Current Sleep Concerns;Current Stress Concerns    Comments  wakes  up at 3 am every morning after his heart attack, stressed processing that he had a heart attack and what that means for his future. He wants to learn all he can about cardiac health.       Family Dynamics   Good Support System?  Yes wife, friends       Screening Interventions   Interventions  Yes;Encouraged to exercise    Expected Outcomes  Short Term goal: Utilizing psychosocial counselor, staff and physician to assist with identification of specific Stressors or current issues interfering with healing process. Setting desired goal for each stressor or current issue identified.;Long Term Goal: Stressors or current issues are controlled or eliminated.;Short Term goal: Identification and review with participant of any Quality of Life or Depression concerns found by scoring the questionnaire.;Long Term goal: The participant improves quality of Life and PHQ9 Scores as seen by post scores and/or verbalization of changes       Quality of Life Scores:  Quality of Life - 10/23/17 1414      Quality of Life Scores   Health/Function Pre  20.73 %    Health/Function Post  23.73 %    Health/Function % Change  14.47 %    Socioeconomic Pre  20.88 %    Socioeconomic Post  27.14 %    Socioeconomic % Change   29.98 %    Psych/Spiritual Pre  21 %    Psych/Spiritual Post  28.43 %    Psych/Spiritual % Change  35.38 %    Family Pre  21 %    Family Post  28.3 %    Family % Change  34.76 %    GLOBAL Pre  20.86 %    GLOBAL Post  26.07 %    GLOBAL % Change  24.98 %      Scores of 19 and below usually indicate a poorer quality of life in these areas.  A difference of  2-3 points is a clinically meaningful difference.  A difference of 2-3 points in the total score of the Quality of Life Index has been associated with significant improvement in overall quality of life, self-image, physical symptoms, and general health in studies assessing change in quality of life.  PHQ-9: Recent Review Flowsheet Data    Depression screen [facility] 2/9 10/23/2017 08/01/2017 06/19/2017   Decreased Interest 1 0 0   Down, Depressed, Hopeless 0 0 0   PHQ - 2 Score 1 0 0   Altered sleeping 2 1 -   Tired, decreased energy 1 1 -   Change in appetite 0 0 -   Feeling bad or failure  about yourself  0 0 -   Trouble concentrating 1 0 -   Moving slowly or fidgety/restless 0 0 -   Suicidal thoughts 0 0 -   PHQ-9 Score 5 2 -   Difficult doing work/chores Somewhat difficult Not difficult at all -     Interpretation of Total Score  Total Score Depression Severity:  1-4 = Minimal depression, 5-9 = Mild depression, 10-14 = Moderate depression, 15-19 = Moderately severe depression, 20-27 = Severe depression   Psychosocial Evaluation and Intervention: Psychosocial Evaluation - 08/07/17 1653      Psychosocial Evaluation & Interventions   Interventions  Encouraged to exercise with the program and follow exercise prescription    Comments  Counselor met with Mr. Kluever [place]) today for initial psychosocial evaluation.  He is a 67 year old who had a heart attack and 2 stents inserted on  12/28 (several weeks ago).  He has a strong support system with a spouse of 26 years; (2) daughters, a brother and sister who live close by and Carey is actively involved in his local church.  He had back surgery this past May and reports it was successful and less painful.  Rudi states he only sleeps 4-5 hours/night and that has been typical for the past year or so.  He has a good appetite.  Dewane denies a history of depression or anxiety or any current symptoms, and states he is typically in a positive mood most of the time.  He has minimal stress in his life other than his health concerns.  Deion has goals to get back to "100%" with his heart - increasing in stamina and strength.  Staff will follow him throughout the course of this program.     Expected Outcomes  Juel will benefit from consistent exercise to achieve his stated goals.  The educational and psychoeducational components of this program will be helpful in learning more about his condition and ways to cope more positively with it.      Continue Psychosocial Services   Follow up required by staff       Psychosocial  Re-Evaluation: Psychosocial Re-Evaluation    [place] Name 09/30/17 1628             Psychosocial Re-Evaluation   Current issues with  None Identified       Comments  No new stress identified.       Interventions  Encouraged to attend Cardiac Rehabilitation for the exercise       Continue Psychosocial Services   Follow up required by staff          Psychosocial Discharge (Final Psychosocial Re-Evaluation): Psychosocial Re-Evaluation - 09/30/17 1628      Psychosocial Re-Evaluation   Current issues with  None Identified    Comments  No new stress identified.    Interventions  Encouraged to attend Cardiac Rehabilitation for the exercise    Continue Psychosocial Services   Follow up required by staff       Vocational Rehabilitation: Provide vocational rehab assistance to qualifying candidates.   Vocational Rehab Evaluation & Intervention: Vocational Rehab - 10/21/17 1751      Initial Vocational Rehab Evaluation & Intervention   Assessment shows need for Vocational Rehabilitation  No  (Pended)        Education: Education Goals: Education classes will be provided on a variety of topics geared toward better understanding of heart health and risk factor modification. Participant will state understanding/return demonstration of topics presented as noted by education test scores.  Learning Barriers/Preferences: Learning Barriers/Preferences - 08/01/17 1412      Learning Barriers/Preferences   Learning Barriers  None    Learning Preferences  Group Instruction       Education Topics:  AED/CPR: - Group verbal and written instruction with the use of models to demonstrate the basic use of the AED with the basic ABC's of resuscitation.   General Nutrition Guidelines/Fats and Fiber: -Group instruction provided by verbal, written material, models and posters to present the general guidelines for heart healthy nutrition. Gives an explanation and review of dietary fats and fiber.    Cardiac Rehab from 10/21/2017 in [facility] Cardiac and Pulmonary Rehab  Date  10/14/17  Educator  PI  Instruction Review Code  1- Verbalizes Understanding      Controlling Sodium/Reading Food Labels: -Group verbal and written material supporting the discussion of sodium  use in heart healthy nutrition. Review and explanation with models, verbal and written materials for utilization of the food label.   Cardiac Rehab from 10/21/2017 in [facility] Cardiac and Pulmonary Rehab  Date  10/21/17  Educator  PI  Instruction Review Code  1- Verbalizes Understanding      Exercise Physiology & General Exercise Guidelines: - Group verbal and written instruction with models to review the exercise physiology of the cardiovascular system and associated critical values. Provides general exercise guidelines with specific guidelines to those with heart or lung disease.    Aerobic Exercise & Resistance Training: - Gives group verbal and written instruction on the various components of exercise. Focuses on aerobic and resistive training programs and the benefits of this training and how to safely progress through these programs..   Cardiac Rehab from 10/21/2017 in [facility] Cardiac and Pulmonary Rehab  Date  09/16/17  Educator  AS  Instruction Review Code  1- Verbalizes Understanding      Flexibility, Balance, Mind/Body Relaxation: Provides group verbal/written instruction on the benefits of flexibility and balance training, including mind/body exercise modes such as yoga, pilates and tai chi.  Demonstration and skill practice provided.   Cardiac Rehab from 10/21/2017 in [facility] Cardiac and Pulmonary Rehab  Date  09/18/17  Educator  AS  Instruction Review Code  1- Verbalizes Understanding      Stress and Anxiety: - Provides group verbal and written instruction about the health risks of elevated stress and causes of high stress.  Discuss the correlation between heart/lung disease and anxiety and treatment options. Review  healthy ways to manage with stress and anxiety.   Cardiac Rehab from 10/21/2017 in [facility] Cardiac and Pulmonary Rehab  Date  09/25/17  Educator  [facility]  Instruction Review Code  1- Verbalizes Understanding      Depression: - Provides group verbal and written instruction on the correlation between heart/lung disease and depressed mood, treatment options, and the stigmas associated with seeking treatment.   Anatomy & Physiology of the Heart: - Group verbal and written instruction and models provide basic cardiac anatomy and physiology, with the coronary electrical and arterial systems. Review of Valvular disease and Heart Failure   Cardiac Rehab from 10/21/2017 in [facility] Cardiac and Pulmonary Rehab  Date  10/07/17  Educator  CE  Instruction Review Code  1- Verbalizes Understanding      Cardiac Procedures: - Group verbal and written instruction to review commonly prescribed medications for heart disease. Reviews the medication, class of the drug, and side effects. Includes the steps to properly store meds and maintain the prescription regimen. (beta blockers and nitrates)   Cardiac Rehab from 10/21/2017 in [facility] Cardiac and Pulmonary Rehab  Date  10/16/17  Educator  [facility]  Instruction Review Code  1- Verbalizes Understanding      Cardiac Medications I: - Group verbal and written instruction to review commonly prescribed medications for heart disease. Reviews the medication, class of the drug, and side effects. Includes the steps to properly store meds and maintain the prescription regimen.   Cardiac Rehab from 10/21/2017 in [facility] Cardiac and Pulmonary Rehab  Date  09/30/17  Educator  CE  Instruction Review Code  1- Verbalizes Understanding      Cardiac Medications II: -Group verbal and written instruction to review commonly prescribed medications for heart disease. Reviews the medication, class of the drug, and side effects. (all other drug classes)   Cardiac Rehab from 10/21/2017 in [facility] Cardiac and  Pulmonary Rehab  Date  09/23/17  Educator  CE  Instruction Review Code  1- Verbalizes Understanding       Go Sex-Intimacy & Heart Disease, Get SMART - Goal Setting: - Group verbal and written instruction through game format to discuss heart disease and the return to sexual intimacy. Provides group verbal and written material to discuss and apply goal setting through the application of the S.M.A.R.T. Method.   Cardiac Rehab from 10/21/2017 in [facility] Cardiac and Pulmonary Rehab  Date  10/16/17  Educator  [facility]  Instruction Review Code  1- Verbalizes Understanding      Other Matters of the Heart: - Provides group verbal, written materials and models to describe Stable Angina and Peripheral Artery. Includes description of the disease process and treatment options available to the cardiac patient.   Exercise & Equipment Safety: - Individual verbal instruction and demonstration of equipment use and safety with use of the equipment.   Cardiac Rehab from 10/21/2017 in [facility] Cardiac and Pulmonary Rehab  Date  08/01/17  Educator  [facility]  Instruction Review Code  1- Verbalizes Understanding      Infection Prevention: - Provides verbal and written material to individual with discussion of infection control including proper hand washing and proper equipment cleaning during exercise session.   Cardiac Rehab from 10/21/2017 in [facility] Cardiac and Pulmonary Rehab  Date  08/01/17  Educator  [facility]  Instruction Review Code  1- Verbalizes Understanding      Falls Prevention: - Provides verbal and written material to individual with discussion of falls prevention and safety.   Cardiac Rehab from 10/21/2017 in [facility] Cardiac and Pulmonary Rehab  Date  08/01/17  Educator  [facility]  Instruction Review Code  1- Verbalizes Understanding      Diabetes: - Individual verbal and written instruction to review signs/symptoms of diabetes, desired ranges of glucose level fasting, after meals and with exercise. Acknowledge that pre  and post exercise glucose checks will be done for 3 sessions at entry of program.   Know Your Numbers and Risk Factors: -Group verbal and written instruction about important numbers in your health.  Discussion of what are risk factors and how they play a role in the disease process.  Review of Cholesterol, Blood Pressure, Diabetes, and BMI and the role they play in your overall health.   Cardiac Rehab from 10/21/2017 in [facility] Cardiac and Pulmonary Rehab  Date  09/23/17  Educator  CE  Instruction Review Code  1- Verbalizes Understanding      Sleep Hygiene: -Provides group verbal and written instruction about how sleep can affect your health.  Define sleep hygiene, discuss sleep cycles and impact of sleep habits. Review good sleep hygiene tips.    Cardiac Rehab from 10/21/2017 in [facility] Cardiac and Pulmonary Rehab  Date  10/09/17  Educator  Lucianne Lei, MSW  Instruction Review Code  2- Demonstrated Understanding      Other: -Provides group and verbal instruction on various topics (see comments)   Knowledge Questionnaire Score: Knowledge Questionnaire Score - 10/23/17 1409      Knowledge Questionnaire Score   Pre Score  18/28    Post Score  22/28       Core Components/Risk Factors/Patient Goals at Admission: Personal Goals and Risk Factors at Admission - 08/01/17 1354      Core Components/Risk Factors/Patient Goals on Admission    Weight Management  Yes;Obesity;Weight Loss    Intervention  Weight Management: Develop a combined nutrition and exercise program designed to reach desired caloric intake, while  maintaining appropriate intake of nutrient and fiber, sodium and fats, and appropriate energy expenditure required for the weight goal.;Weight Management: Provide education and appropriate resources to help participant work on and attain dietary goals.;Weight Management/Obesity: Establish reasonable short term and long term weight goals.;Obesity: Provide education and appropriate  resources to help participant work on and attain dietary goals.    Admit Weight  179 lb 3.2 oz (81.3 kg)    Goal Weight: Short Term  174 lb (78.9 kg)    Goal Weight: Long Term  165 lb (74.8 kg)    Expected Outcomes  Short Term: Continue to assess and modify interventions until short term weight is achieved;Long Term: Adherence to nutrition and physical activity/exercise program aimed toward attainment of established weight goal;Weight Loss: Understanding of general recommendations for a balanced deficit meal plan, which promotes 1-2 lb weight loss per week and includes a negative energy balance of [phone] kcal/d;Understanding recommendations for meals to include 15-35% energy as protein, 25-35% energy from fat, 35-60% energy from carbohydrates, less than 249m of dietary cholesterol, 20-35 gm of total fiber daily;Understanding of distribution of calorie intake throughout the day with the consumption of 4-5 meals/snacks    Hypertension  Yes    Intervention  Provide education on lifestyle modifcations including regular physical activity/exercise, weight management, moderate sodium restriction and increased consumption of fresh fruit, vegetables, and low fat dairy, alcohol moderation, and smoking cessation.;Monitor prescription use compliance.    Expected Outcomes  Short Term: Continued assessment and intervention until BP is < 140/971mHG in hypertensive participants. < 130/8021mG in hypertensive participants with diabetes, heart failure or chronic kidney disease.;Long Term: Maintenance of blood pressure at goal levels.    Lipids  Yes    Intervention  Provide education and support for participant on nutrition & aerobic/resistive exercise along with prescribed medications to achieve LDL <54m20mDL >40mg76m Expected Outcomes  Short Term: Participant states understanding of desired cholesterol values and is compliant with medications prescribed. Participant is following exercise prescription and nutrition  guidelines.;Long Term: Cholesterol controlled with medications as prescribed, with individualized exercise RX and with personalized nutrition plan. Value goals: LDL < 54mg,57m > 40 mg.    Stress  Yes DennisKemondressed about having a heart attack. He says he has never had any type of heart issues before, so this is all new.    Intervention  Offer individual and/or small group education and counseling on adjustment to heart disease, stress management and health-related lifestyle change. Teach and support self-help strategies.;Refer participants experiencing significant psychosocial distress to appropriate mental health specialists for further evaluation and treatment. When possible, include family members and significant others in education/counseling sessions.    Expected Outcomes  Short Term: Participant demonstrates changes in health-related behavior, relaxation and other stress management skills, ability to obtain effective social support, and compliance with psychotropic medications if prescribed.;Long Term: Emotional wellbeing is indicated by absence of clinically significant psychosocial distress or social isolation.       Core Components/Risk Factors/Patient Goals Review:  Goals and Risk Factor Review    Row Name 09/30/17 1623             Core Components/Risk Factors/Patient Goals Review   Personal Goals Review  Weight Management/Obesity;Lipids;Hypertension;Improve shortness of breath with ADL's       Review  DennisWasifad good BP readings and is taking all meds as directed.  He has had SOB and has spoken to his Dr - who believes its from  [place] - it happens at night or when he is sitting still.  Follow up on this symtpom with Dr.  It doesnt happen with exercise or other activities.         Expected Outcomes  Short - Jowan wil continue to exrecise and take meds Long - Dayquan will maintain exercise          Core Components/Risk Factors/Patient Goals at Discharge (Final Review):   Goals and Risk Factor Review - 09/30/17 1623      Core Components/Risk Factors/Patient Goals Review   Personal Goals Review  Weight Management/Obesity;Lipids;Hypertension;Improve shortness of breath with ADL's    Review  Massimo has had good BP readings and is taking all meds as directed.  He has had SOB and has spoken to his Dr - who believes its from [place] - it happens at night or when he is sitting still.  Follow up on this symtpom with Dr.  It doesnt happen with exercise or other activities.      Expected Outcomes  Short - Ger wil continue to exrecise and take meds Long - Kowen will maintain exercise       ITP Comments: ITP Comments    Row Name 08/01/17 1344 08/21/17 0638 08/28/17 1212 09/12/17 1646 09/18/17 0602   ITP Comments  Med review completed. Initial ITP created. Diagnosis can be found in [facility] 07/20/17  30 Day review. Continue with ITP unless directed changes per Medical Director review.  New to program.  1/28 had another coronary stent placed, will wait for medical clearance to return to the program  Weston had a recent cath and Dr Fletcher Anon wants to wait until the next plan of treatment is decided before he continues exercise.  Returns today, no med changes after third stent staged and completed  30 day review. Continue with ITP unless directed changes per Medical Director review.    1 visit this month   Row Name 09/30/17 1731 10/16/17 0650         ITP Comments  Dietician appointment scheduled for October 17, 2017.   30 Day review. Continue with ITP unless directed changes per Medical Director review.           Comments: Updated Discharge ITP

## 2017-11-05 ENCOUNTER — Observation Stay
Admission: EM | Admit: 2017-11-05 | Discharge: 2017-11-07 | Disposition: A | Discharge status: Home / Self Care | Payer: No Typology Code available for payment source | Attending: Internal Medicine | Admitting: Internal Medicine

## 2017-11-05 ENCOUNTER — Emergency Department: Payer: No Typology Code available for payment source

## 2017-11-05 ENCOUNTER — Other Ambulatory Visit: Payer: Self-pay

## 2017-11-05 DIAGNOSIS — E876 Hypokalemia: Secondary | ICD-10-CM | POA: Diagnosis not present

## 2017-11-05 DIAGNOSIS — Z7902 Long term (current) use of antithrombotics/antiplatelets: Secondary | ICD-10-CM | POA: Insufficient documentation

## 2017-11-05 DIAGNOSIS — E785 Hyperlipidemia, unspecified: Secondary | ICD-10-CM | POA: Insufficient documentation

## 2017-11-05 DIAGNOSIS — R079 Chest pain, unspecified: Secondary | ICD-10-CM | POA: Diagnosis present

## 2017-11-05 DIAGNOSIS — Z8249 Family history of ischemic heart disease and other diseases of the circulatory system: Secondary | ICD-10-CM | POA: Insufficient documentation

## 2017-11-05 DIAGNOSIS — I110 Hypertensive heart disease with heart failure: Secondary | ICD-10-CM | POA: Diagnosis not present

## 2017-11-05 DIAGNOSIS — I959 Hypotension, unspecified: Secondary | ICD-10-CM | POA: Insufficient documentation

## 2017-11-05 DIAGNOSIS — R739 Hyperglycemia, unspecified: Secondary | ICD-10-CM | POA: Insufficient documentation

## 2017-11-05 DIAGNOSIS — Z8673 Personal history of transient ischemic attack (TIA), and cerebral infarction without residual deficits: Secondary | ICD-10-CM | POA: Insufficient documentation

## 2017-11-05 DIAGNOSIS — I200 Unstable angina: Secondary | ICD-10-CM

## 2017-11-05 DIAGNOSIS — I5022 Chronic systolic (congestive) heart failure: Secondary | ICD-10-CM | POA: Insufficient documentation

## 2017-11-05 DIAGNOSIS — N400 Enlarged prostate without lower urinary tract symptoms: Secondary | ICD-10-CM | POA: Diagnosis not present

## 2017-11-05 DIAGNOSIS — I480 Paroxysmal atrial fibrillation: Secondary | ICD-10-CM | POA: Insufficient documentation

## 2017-11-05 DIAGNOSIS — K219 Gastro-esophageal reflux disease without esophagitis: Secondary | ICD-10-CM | POA: Diagnosis not present

## 2017-11-05 DIAGNOSIS — Z885 Allergy status to narcotic agent status: Secondary | ICD-10-CM | POA: Diagnosis not present

## 2017-11-05 DIAGNOSIS — Z7982 Long term (current) use of aspirin: Secondary | ICD-10-CM | POA: Insufficient documentation

## 2017-11-05 DIAGNOSIS — Z91041 Radiographic dye allergy status: Secondary | ICD-10-CM | POA: Insufficient documentation

## 2017-11-05 DIAGNOSIS — Z955 Presence of coronary angioplasty implant and graft: Secondary | ICD-10-CM | POA: Insufficient documentation

## 2017-11-05 DIAGNOSIS — I252 Old myocardial infarction: Secondary | ICD-10-CM | POA: Insufficient documentation

## 2017-11-05 DIAGNOSIS — I255 Ischemic cardiomyopathy: Secondary | ICD-10-CM | POA: Diagnosis not present

## 2017-11-05 DIAGNOSIS — T380X5A Adverse effect of glucocorticoids and synthetic analogues, initial encounter: Secondary | ICD-10-CM | POA: Insufficient documentation

## 2017-11-05 DIAGNOSIS — I25110 Atherosclerotic heart disease of native coronary artery with unstable angina pectoris: Principal | ICD-10-CM | POA: Insufficient documentation

## 2017-11-05 DIAGNOSIS — Z87891 Personal history of nicotine dependence: Secondary | ICD-10-CM | POA: Insufficient documentation

## 2017-11-05 HISTORY — DX: Paroxysmal atrial fibrillation: I48.0

## 2017-11-05 HISTORY — DX: Radiographic dye allergy status: Z91.041

## 2017-11-05 HISTORY — DX: Ischemic cardiomyopathy: I25.5

## 2017-11-05 MED ORDER — NITROGLYCERIN 2 % TD OINT
1.0000 [in_us] | TOPICAL_OINTMENT | Freq: Once | TRANSDERMAL | Status: AC
  Administered 2017-11-05: 1 [in_us] via TOPICAL
  Filled 2017-11-05: qty 1

## 2017-11-05 NOTE — ED Triage Notes (Signed)
Pt presents via EMS w/ c/o chest pain. Pt states the pain started while mowing the yard. Pt states L arm numbness. Pt has had ASA 324 mg prior to arrival. Pt c/o slight shortness of breath, denies diaphoresis. Pt has had intermittent chest pain x 1 week w/ variable associated sxs like diaphoresis and shortness of breath.

## 2017-11-05 NOTE — ED Provider Notes (Signed)
[facility] Emergency Department Provider Note   ____________________________________________   First MD Initiated Contact with Patient 11/05/17 2305     (approximate)  I have reviewed the triage vital signs and the nursing notes.   HISTORY  Chief Complaint Chest Pain    HPI Todd Weaver is a 67 y.o. male brought to the ED from home via EMS with a chief complaint of chest pain.  Patient has history of STEMI in December 2018 status post 3 stents, on Plavix.  Reports waxing/waning chest pain with diaphoresis yesterday which resolved after 8 baby aspirins.  This evening patient used a weed eater on his lawn, then mowed his grass on a riding lawnmower and developed left-sided chest pressure associated with left arm numbness.  He was slightly short of breath but denies diaphoresis.  Denies associated palpitations, nausea or vomiting.  Received 4 baby aspirin per EMS prior to arrival.  Currently pain 2/10, decreased from 5/10.  Denies recent fever, chills, cough, congestion, abdominal pain, diarrhea.  Denies recent travel or trauma.   Past Medical History:  Diagnosis Date  . Acute ST elevation myocardial infarction (STEMI) of inferior wall ([place]) 07/18/2017  . CAD (coronary artery disease)    a. nuc stress test 9/17: small defect of mild severity in the apex location, partially reversible and suspected to be due to artifact given intense GI uptake, EF 55-65%, low risk study; b. inferior STEMI 07/18/17  . GERD (gastroesophageal reflux disease)   . Hemorrhoid   . Hyperlipidemia   . Hypertension   . Stroke [facility]) 2011   "2 MINI STROKES"    Patient Active Problem List   Diagnosis Date Noted  . Daytime sleepiness 09/23/2017  . Effort angina ([place]) 09/09/2017  . Contraindication to percutaneous coronary intervention (PCI) 09/09/2017  . Coronary artery disease 08/15/2017  . Shortness of breath 07/22/2017  . Acute myocardial infarction of inferior wall ([place])  07/18/2017  . STEMI (ST elevation myocardial infarction) ([place]) 07/18/2017  . Hypertension 06/19/2017  . Hyperlipidemia 06/19/2017  . History of CVA (cerebrovascular accident) 06/19/2017  . Overweight (BMI 25.0-29.9) 06/19/2017  . BPH (benign prostatic hyperplasia) 06/19/2017  . ED (erectile dysfunction) 06/19/2017  . MCI (mild cognitive impairment) 06/19/2017  . FH: Alzheimer's disease 06/19/2017  . Lumbar spondylosis with myelopathy 11/08/2016  . Hand dermatitis 07/26/2014    Past Surgical History:  Procedure Laterality Date  . ANAL FISTULECTOMY N/A 12/21/2014   Procedure: FISTULECTOMY ANAL;  Surgeon: Christene Lye, MD;  Location: [facility] ORS;  Service: General;  Laterality: N/A;  . APPENDECTOMY  1975  . CORONARY STENT INTERVENTION N/A 09/09/2017   Procedure: CORONARY STENT INTERVENTION;  Surgeon: Wellington Hampshire, MD;  Location: [place] CV LAB;  Service: Cardiovascular;  Laterality: N/A;  . CORONARY/GRAFT ACUTE MI REVASCULARIZATION N/A 07/18/2017   Procedure: Coronary/Graft Acute MI Revascularization;  Surgeon: Wellington Hampshire, MD;  Location: [place] CV LAB;  Service: Cardiovascular;  Laterality: N/A;  . HEMORRHOID SURGERY N/A 12/21/2014   Procedure: HEMORRHOIDECTOMY;  Surgeon: Christene Lye, MD;  Location: [facility] ORS;  Service: General;  Laterality: N/A;  . INTRAVASCULAR ULTRASOUND/IVUS N/A 09/09/2017   Procedure: Intravascular Ultrasound/IVUS;  Surgeon: Wellington Hampshire, MD;  Location: [place] CV LAB;  Service: Cardiovascular;  Laterality: N/A;  . KNEE SURGERY Right age 53  . LEFT HEART CATH AND CORONARY ANGIOGRAPHY N/A 07/18/2017   Procedure: LEFT HEART CATH AND CORONARY ANGIOGRAPHY;  Surgeon: Wellington Hampshire, MD;  Location: [place] CV LAB;  Service: Cardiovascular;  Laterality: N/A;  . LEFT HEART CATH AND CORONARY ANGIOGRAPHY Left 08/19/2017   Procedure: LEFT HEART CATH AND CORONARY ANGIOGRAPHY;  Surgeon: Wellington Hampshire, MD;  Location: [place] [facility];  Service: Cardiovascular;  Laterality: Left;  . ROTATOR CUFF REPAIR Right 2013    Prior to Admission medications   Medication Sig Start Date End Date Taking? Authorizing Provider  aspirin EC 81 MG tablet Take 81 mg by mouth daily.  05/23/09   [provider]  atorvastatin (LIPITOR) 80 MG tablet Take 1 tablet (80 mg total) by mouth daily at 6 PM. 09/20/17   Plonk, Gwyndolyn Saxon, MD  clopidogrel (PLAVIX) 75 MG tablet Take 300 mg (4 tablets) by mouth the first day, then take 75 mg (1 tablet) by mouth once a day thereafter. 10/14/17   Wellington Hampshire, MD  ezetimibe (ZETIA) 10 MG tablet Take 1 tablet (10 mg total) by mouth daily. 09/25/17 12/24/17  Rise Mu, PA-C  losartan (COZAAR) 25 MG tablet Take 1 tablet (25 mg total) by mouth daily. 09/23/17   Plonk, Gwyndolyn Saxon, MD  metoprolol succinate (TOPROL-XL) 25 MG 24 hr tablet Take 0.5 tablets (12.5 mg total) by mouth daily. 09/24/17   Dunn, Areta Haber, PA-C  nitroGLYCERIN (NITROSTAT) 0.4 MG SL tablet Place 1 tablet (0.4 mg total) under the tongue every 5 (five) minutes as needed for chest pain. 07/20/17   Loletha Grayer, MD  tamsulosin (FLOMAX) 0.4 MG CAPS capsule Take 1 capsule (0.4 mg total) by mouth daily. 09/16/17   Adline Potter, MD    Allergies Codeine; Shellfish allergy; and Contrast media [iodinated diagnostic agents]  Family History  Problem Relation Age of Onset  . Hypertension Mother   . Alzheimer's disease Mother   . Hypertension Father   . Alzheimer's disease Father     Social History Social History   Tobacco Use  . Smoking status: Former Smoker    Last attempt to quit: 07/24/2007    Years since quitting: 10.2  . Smokeless tobacco: Never Used  Substance Use Topics  . Alcohol use: Yes    Alcohol/week: 0.0 oz    Comment: OCC  . Drug use: No    Review of Systems  Constitutional: No fever/chills. Eyes: No visual changes. ENT: No sore throat. Cardiovascular: Positive for chest pain. Respiratory: Positive for  shortness of breath. Gastrointestinal: No abdominal pain.  No nausea, no vomiting.  No diarrhea.  No constipation. Genitourinary: Negative for dysuria. Musculoskeletal: Negative for back pain. Skin: Negative for rash. Neurological: Negative for headaches, focal weakness or numbness.   ____________________________________________   PHYSICAL EXAM:  VITAL SIGNS: ED Triage Vitals  Enc Vitals Group     BP 11/05/17 2242 (!) 148/70     Pulse Rate 11/05/17 2242 (!) 55     Resp 11/05/17 2242 16     Temp 11/05/17 2242 99.8 F (37.7 C)     Temp Source 11/05/17 2242 Oral     SpO2 11/05/17 2242 98 %     Weight 11/05/17 2251 187 lb (84.8 kg)     Height 11/05/17 2251 5\' 7"  (1.702 m)     Head Circumference --      Peak Flow --      Pain Score 11/05/17 2250 0     Pain Loc --      Pain Edu? --      Excl. in [place]? --     Constitutional: Alert and oriented. Well appearing and in no acute distress. Eyes:  Conjunctivae are normal. PERRL. EOMI. Head: Atraumatic. Nose: No congestion/rhinnorhea. Mouth/Throat: Mucous membranes are moist.  Oropharynx non-erythematous. Neck: No stridor.  No carotid bruits. Cardiovascular: Normal rate, regular rhythm. Grossly normal heart sounds.  Good peripheral circulation. Respiratory: Normal respiratory effort.  No retractions. Lungs CTAB. Gastrointestinal: Soft and nontender. No distention. No abdominal bruits. No CVA tenderness. Musculoskeletal: No lower extremity tenderness nor edema.  No joint effusions. Neurologic:  Normal speech and language. No gross focal neurologic deficits are appreciated. No gait instability. Skin:  Skin is warm, dry and intact. No rash noted. Psychiatric: Mood and affect are normal. Speech and behavior are normal.  ____________________________________________   LABS (all labs ordered are listed, but only abnormal results are displayed)  Labs Reviewed  BASIC METABOLIC PANEL - Abnormal; Notable for the following components:       Result Value   Potassium 3.4 (*)    Glucose, Bld 107 (*)    All other components within normal limits  CBC - Abnormal; Notable for the following components:   HCT 38.1 (*)    RDW 15.4 (*)    All other components within normal limits  TROPONIN I   ____________________________________________  EKG  ED ECG REPORT I, Russel Morain J, the attending physician, personally viewed and interpreted this ECG.   Date: 11/05/2017  EKG Time: 2244  Rate: 54  Rhythm: sinus bradycardia  Axis: Normal  Intervals:none  ST&T Change: Nonspecific  ____________________________________________  RADIOLOGY  ED MD interpretation: Left basilar atelectasis, otherwise no acute cardiopulmonary process  Official radiology report(s): Dg Chest Port 1 View  Result Date: 11/05/2017 CLINICAL DATA:  Acute onset of generalized chest pain. EXAM: PORTABLE CHEST 1 VIEW COMPARISON:  Chest radiograph performed 07/22/2017 FINDINGS: The lungs are well-aerated. Minimal left basilar atelectasis is noted. There is no evidence of pleural effusion or pneumothorax. The cardiomediastinal silhouette is within normal limits. No acute osseous abnormalities are seen. IMPRESSION: Minimal left basilar atelectasis noted.  Lungs otherwise clear. Electronically Signed   By: Garald Balding M.D.   On: 11/05/2017 23:26    ____________________________________________   PROCEDURES  Procedure(s) performed: None  Procedures  Critical Care performed: No  ____________________________________________   INITIAL IMPRESSION / ASSESSMENT AND PLAN / ED COURSE  As part of my medical decision making, I reviewed the following data within the electronic MEDICAL RECORD NUMBER History obtained from family, Nursing notes reviewed and incorporated, Labs reviewed, EKG interpreted, Old chart reviewed, Radiograph reviewed, Discussed with admitting physician and Notes from prior ED visits   67 year old male with CAD status post  STEMI with stents, on  Plavix, who presents with symptoms concerning for unstable angina. Differential diagnosis includes, but is not limited to, ACS, aortic dissection, pulmonary embolism, cardiac tamponade, pneumothorax, pneumonia, pericarditis, myocarditis, GI-related causes including esophagitis/gastritis, and musculoskeletal chest wall pain.    Will apply nitroglycerin paste.  Check screening lab work including troponin.  Based on patient's cardiac history and history concerning for unstable angina, anticipate hospital admission.      ____________________________________________   FINAL CLINICAL IMPRESSION(S) / ED DIAGNOSES  Final diagnoses:  Chest pain, unspecified type  Unstable angina [facility])     ED Discharge Orders    None       Note:  This document was prepared using Dragon voice recognition software and may include unintentional dictation errors.    Paulette Blanch, MD 11/06/17 [phone]

## 2017-11-06 ENCOUNTER — Encounter
Admission: EM | Disposition: A | Discharge status: Home / Self Care | Payer: Self-pay | Source: Home / Self Care | Attending: Emergency Medicine

## 2017-11-06 ENCOUNTER — Encounter: Payer: Self-pay | Admitting: Internal Medicine

## 2017-11-06 DIAGNOSIS — I25110 Atherosclerotic heart disease of native coronary artery with unstable angina pectoris: Secondary | ICD-10-CM

## 2017-11-06 DIAGNOSIS — E785 Hyperlipidemia, unspecified: Secondary | ICD-10-CM

## 2017-11-06 DIAGNOSIS — I10 Essential (primary) hypertension: Secondary | ICD-10-CM | POA: Diagnosis not present

## 2017-11-06 DIAGNOSIS — R079 Chest pain, unspecified: Secondary | ICD-10-CM | POA: Diagnosis present

## 2017-11-06 DIAGNOSIS — I209 Angina pectoris, unspecified: Secondary | ICD-10-CM

## 2017-11-06 HISTORY — DX: Chest pain, unspecified: R07.9

## 2017-11-06 HISTORY — DX: Angina pectoris, unspecified: I20.9

## 2017-11-06 LAB — BASIC METABOLIC PANEL
ANION GAP: 6 (ref 5–15)
BUN: 14 mg/dL (ref 6–20)
CALCIUM: 9.1 mg/dL (ref 8.9–10.3)
CO2: 27 mmol/L (ref 22–32)
Chloride: 103 mmol/L (ref 101–111)
Creatinine, Ser: 0.98 mg/dL (ref 0.61–1.24)
GFR calc Af Amer: >60 mL/min (ref >60)
GLUCOSE: 107 mg/dL — AB (ref 65–99)
Potassium: 3.4 mmol/L — ABNORMAL LOW (ref 3.5–5.1)
Sodium: 136 mmol/L (ref 135–145)

## 2017-11-06 LAB — CBC
HCT: 38.1 % — ABNORMAL LOW (ref 40.0–52.0)
HEMOGLOBIN: 13 g/dL (ref 13.0–18.0)
MCH: 28.7 pg (ref 26.0–34.0)
MCHC: 34 g/dL (ref 32.0–36.0)
MCV: 84.4 fL (ref 80.0–100.0)
PLATELETS: 271 10*3/uL (ref 150–440)
RBC: 4.52 MIL/uL (ref 4.40–5.90)
RDW: 15.4 % — ABNORMAL HIGH (ref 11.5–14.5)
WBC: 8.5 10*3/uL (ref 3.8–10.6)

## 2017-11-06 LAB — HEMOGLOBIN A1C
Hgb A1c MFr Bld: 5.8 % — ABNORMAL HIGH (ref 4.8–5.6)
Mean Plasma Glucose: 119.76 mg/dL

## 2017-11-06 LAB — TROPONIN I: Troponin I: <0.03 ng/mL (ref <0.03)

## 2017-11-06 SURGERY — LEFT HEART CATH AND CORONARY ANGIOGRAPHY
Anesthesia: Moderate Sedation

## 2017-11-06 MED ORDER — SODIUM CHLORIDE 0.9% FLUSH: 3.0000 mL | INTRAVENOUS | Status: DC | PRN

## 2017-11-06 MED ORDER — TAMSULOSIN HCL 0.4 MG PO CAPS
0.4000 mg | ORAL_CAPSULE | Freq: Every day | ORAL | Status: DC
  Administered 2017-11-06 – 2017-11-07 (×2): 0.4 mg via ORAL
  Filled 2017-11-06 (×2): qty 1

## 2017-11-06 MED ORDER — SODIUM CHLORIDE 0.9 % IV SOLN
INTRAVENOUS | Status: DC
  Administered 2017-11-06: 21:00:00 via INTRAVENOUS

## 2017-11-06 MED ORDER — SODIUM CHLORIDE 0.9% FLUSH: 3.0000 mL | Freq: Two times a day (BID) | INTRAVENOUS | Status: DC

## 2017-11-06 MED ORDER — SODIUM CHLORIDE 0.9 % WEIGHT BASED INFUSION
3.0000 mL/kg/h | INTRAVENOUS | Status: DC
  Administered 2017-11-06: 3 mL/kg/h via INTRAVENOUS

## 2017-11-06 MED ORDER — EZETIMIBE 10 MG PO TABS
10.0000 mg | ORAL_TABLET | Freq: Every day | ORAL | Status: DC
  Administered 2017-11-06 – 2017-11-07 (×2): 10 mg via ORAL
  Filled 2017-11-06 (×2): qty 1

## 2017-11-06 MED ORDER — POTASSIUM CHLORIDE CRYS ER 20 MEQ PO TBCR
40.0000 meq | EXTENDED_RELEASE_TABLET | Freq: Once | ORAL | Status: AC
  Administered 2017-11-06: 40 meq via ORAL
  Filled 2017-11-06: qty 2

## 2017-11-06 MED ORDER — SODIUM CHLORIDE 0.9 % IV SOLN: 250.0000 mL | INTRAVENOUS | Status: DC | PRN

## 2017-11-06 MED ORDER — ACETAMINOPHEN 325 MG PO TABS: 650.0000 mg | ORAL_TABLET | ORAL | Status: DC | PRN

## 2017-11-06 MED ORDER — LOSARTAN POTASSIUM 25 MG PO TABS
25.0000 mg | ORAL_TABLET | Freq: Every day | ORAL | Status: DC
  Administered 2017-11-06 – 2017-11-07 (×2): 25 mg via ORAL
  Filled 2017-11-06 (×2): qty 1

## 2017-11-06 MED ORDER — ONDANSETRON HCL 4 MG/2ML IJ SOLN: 4.0000 mg | Freq: Four times a day (QID) | INTRAMUSCULAR | Status: DC | PRN

## 2017-11-06 MED ORDER — SODIUM CHLORIDE 0.9 % WEIGHT BASED INFUSION: 1.0000 mL/kg/h | INTRAVENOUS | Status: DC

## 2017-11-06 MED ORDER — CLOPIDOGREL BISULFATE 75 MG PO TABS
75.0000 mg | ORAL_TABLET | Freq: Every day | ORAL | Status: DC
  Administered 2017-11-06 – 2017-11-07 (×2): 75 mg via ORAL
  Filled 2017-11-06 (×2): qty 1

## 2017-11-06 MED ORDER — METOPROLOL SUCCINATE ER 25 MG PO TB24
12.5000 mg | ORAL_TABLET | Freq: Every day | ORAL | Status: DC
  Administered 2017-11-06 – 2017-11-07 (×2): 12.5 mg via ORAL
  Filled 2017-11-06 (×2): qty 1

## 2017-11-06 MED ORDER — PREDNISONE 50 MG PO TABS
50.0000 mg | ORAL_TABLET | Freq: Four times a day (QID) | ORAL | Status: AC
  Administered 2017-11-06 – 2017-11-07 (×3): 50 mg via ORAL
  Filled 2017-11-06 (×3): qty 1

## 2017-11-06 MED ORDER — ATORVASTATIN CALCIUM 20 MG PO TABS
80.0000 mg | ORAL_TABLET | Freq: Every day | ORAL | Status: DC
  Administered 2017-11-06: 80 mg via ORAL
  Filled 2017-11-06: qty 4

## 2017-11-06 MED ORDER — MORPHINE SULFATE (PF) 2 MG/ML IV SOLN: 2.0000 mg | INTRAVENOUS | Status: DC | PRN

## 2017-11-06 MED ORDER — ASPIRIN 81 MG PO CHEW
81.0000 mg | CHEWABLE_TABLET | ORAL | Status: AC
  Administered 2017-11-07: 81 mg via ORAL
  Filled 2017-11-06: qty 1

## 2017-11-06 MED ORDER — MORPHINE SULFATE (PF) 4 MG/ML IV SOLN: 4.0000 mg | INTRAVENOUS | Status: DC | PRN

## 2017-11-06 MED ORDER — ENOXAPARIN SODIUM 40 MG/0.4ML ~~LOC~~ SOLN
40.0000 mg | SUBCUTANEOUS | Status: DC
  Administered 2017-11-06: 40 mg via SUBCUTANEOUS
  Filled 2017-11-06: qty 0.4

## 2017-11-06 MED ORDER — DIPHENHYDRAMINE HCL 50 MG/ML IJ SOLN: 50.0000 mg | Freq: Once | INTRAMUSCULAR | Status: AC

## 2017-11-06 MED ORDER — ISOSORBIDE MONONITRATE ER 30 MG PO TB24
30.0000 mg | ORAL_TABLET | Freq: Every day | ORAL | Status: DC
  Administered 2017-11-06 – 2017-11-07 (×2): 30 mg via ORAL
  Filled 2017-11-06 (×2): qty 1

## 2017-11-06 MED ORDER — ASPIRIN EC 81 MG PO TBEC
81.0000 mg | DELAYED_RELEASE_TABLET | Freq: Every day | ORAL | Status: DC
  Administered 2017-11-06: 81 mg via ORAL
  Filled 2017-11-06: qty 1

## 2017-11-06 MED ORDER — DIPHENHYDRAMINE HCL 25 MG PO CAPS
50.0000 mg | ORAL_CAPSULE | Freq: Once | ORAL | Status: AC
  Administered 2017-11-07: 50 mg via ORAL
  Filled 2017-11-06: qty 2

## 2017-11-06 NOTE — Plan of Care (Signed)
  Problem: Clinical Measurements: Goal: Diagnostic test results will improve Outcome: Progressing Goal: Cardiovascular complication will be avoided Outcome: Progressing   

## 2017-11-06 NOTE — Consult Note (Signed)
Cardiology Consult    Patient ID: Todd Weaver MRN: [id number], DOB/AGE: Jan 01, 1951   Admit date: 11/05/2017 Date of Consult: 11/06/2017  Primary Physician: Glean Hess, MD Primary Cardiologist: Kathlyn Sacramento, MD Requesting Provider: R. Wieting, MD  Patient Profile    Todd Weaver is a 67 y.o. male with a history of coronary artery disease status post RCA and LAD stenting, hypertension, hyperlipidemia, stroke, and GERD, who is being seen today for the evaluation of chest pain at the request of Dr. Leslye Peer.  Past Medical History   Past Medical History:  Diagnosis Date  . Acute ST elevation myocardial infarction (STEMI) of inferior wall ([place]) 07/18/2017  . CAD (coronary artery disease)    a. 03/2016 MV: EF 55-65%, low risk study; b. 06/2017 Inf STEMI: LM 85d, LAD 60p, 63m, LCX 40p, RCA 99ost (3.25x18 Xience [place] DES/3.0x38 [place] DES), RPAV 60; c. 07/2017 Cath: LM 85d, LAD 85ost, 60p, 51m, RI 50ost, LCX 50p, RCA patent stent, RPAD/RPAV min irregs; c. 08/2017 PCI dLM/LAD (3.0x38 Xience [place]).  . Contrast media allergy   . GERD (gastroesophageal reflux disease)   . Hemorrhoid   . Hyperlipidemia   . Hypertension   . Ischemic cardiomyopathy    a. 06/2017 Echo: EF 45-50%, mod inf/infsept HK. Mildly dil LA.  Marland Kitchen PAF (paroxysmal atrial fibrillation) ([place])    a. Brief episode during December hospitalization-->converted on IV amio-->not on [place].  Marland Kitchen Stroke [facility]) 2011   "2 MINI STROKES"    Past Surgical History:  Procedure Laterality Date  . ANAL FISTULECTOMY N/A 12/21/2014   Procedure: FISTULECTOMY ANAL;  Surgeon: Christene Lye, MD;  Location: [facility] ORS;  Service: General;  Laterality: N/A;  . APPENDECTOMY  1975  . CORONARY STENT INTERVENTION N/A 09/09/2017   Procedure: CORONARY STENT INTERVENTION;  Surgeon: Wellington Hampshire, MD;  Location: [place] CV LAB;  Service: Cardiovascular;  Laterality: N/A;  . CORONARY/GRAFT ACUTE MI REVASCULARIZATION N/A 07/18/2017   Procedure: Coronary/Graft Acute MI Revascularization;  Surgeon: Wellington Hampshire, MD;  Location: [place] CV LAB;  Service: Cardiovascular;  Laterality: N/A;  . HEMORRHOID SURGERY N/A 12/21/2014   Procedure: HEMORRHOIDECTOMY;  Surgeon: Christene Lye, MD;  Location: [facility] ORS;  Service: General;  Laterality: N/A;  . INTRAVASCULAR ULTRASOUND/IVUS N/A 09/09/2017   Procedure: Intravascular Ultrasound/IVUS;  Surgeon: Wellington Hampshire, MD;  Location: [place] CV LAB;  Service: Cardiovascular;  Laterality: N/A;  . KNEE SURGERY Right age 26  . LEFT HEART CATH AND CORONARY ANGIOGRAPHY N/A 07/18/2017   Procedure: LEFT HEART CATH AND CORONARY ANGIOGRAPHY;  Surgeon: Wellington Hampshire, MD;  Location: [place] CV LAB;  Service: Cardiovascular;  Laterality: N/A;  . LEFT HEART CATH AND CORONARY ANGIOGRAPHY Left 08/19/2017   Procedure: LEFT HEART CATH AND CORONARY ANGIOGRAPHY;  Surgeon: Wellington Hampshire, MD;  Location: [place] [facility] LAB;  Service: Cardiovascular;  Laterality: Left;  . ROTATOR CUFF REPAIR Right 2013     Allergies  Allergies  Allergen Reactions  . Codeine Hives  . Shellfish Allergy Hives and Swelling  . Contrast Media [Iodinated Diagnostic Agents] Itching and Other (See Comments)    Whelps on tongue    History of Present Illness    67 year old male with the above complex past medical history including coronary artery disease status post inferior ST segment elevation myocardial infarction in late December 2018 requiring stenting of the RCA and subsequent stenting of LAD, hypertension, hyperlipidemia, contrast allergy, GERD, and stroke.  As noted, he presented to  [facility] in December 2018 with chest pain and found to have inferior ST segment elevation w/ RV involvement and hypotension.  Catheterization revealed severe ostial RCA as well as distal left main and ostial LAD disease.  The RCA was successfully treated with a drug-eluting stent.  Initial  recommendation was made for medical therapy of distal left main/ostial LAD disease.  Post PCI, he had a brief episode of AFib in the ICU that converted with IV amio.  He recovered well and was subsequently d/c'd.  In office follow-up, he reported  Exertional chest pain and dyspnea, and underwent repeat catheterization in January, showing patency of the RCA with ongoing distal left main/ostial LAD disease.  After further review with interventional colleagues, patient subsequently underwent guided PCI and drug-eluting stent placementIVUS to the ostial and proximal LAD in mid February.  Patient has been participating in cardiac rehab since then and mostly has done well.  He says he occasionally notes an episode of exertional chest discomfort but only with very high levels of activity.  Unfortunately, earlier this week, he awoke in the early morning hours of April 15, with substernal chest tightness radiating to his left shoulder, associated with diaphoresis, and mild dyspnea.  He took a total of 8 baby aspirin over the span of 2 or 3 hours with eventual complete relief of symptoms.  He stayed out of work on April 15th but felt better on April 16.  That afternoon, he was mowing his lawn and towards the end of mowing, he developed recurrent chest tightness with radiation and numbness noted in the left shoulder and arm.  This was associated with dyspnea.  He went in his home and statin symptoms resolved within about an hour.  Sometime later in the evening, he had recurrent chest discomfort at rest, prompting him to present to the emergency department.  Symptoms resolved within an hour.  In the ER, ECG was nonacute.  Troponin was normal.  He was admitted.  He has had no recurrent chest pain and troponin has remained normal.  He is currently symptom-free.  Cardiology has been asked to evaluate.  Inpatient Medications    . aspirin EC  81 mg Oral Daily  . atorvastatin  80 mg Oral q1800  . clopidogrel  75 mg Oral Daily   . enoxaparin (LOVENOX) injection  40 mg Subcutaneous Q24H  . ezetimibe  10 mg Oral Daily  . losartan  25 mg Oral Daily  . metoprolol succinate  12.5 mg Oral Daily  . tamsulosin  0.4 mg Oral Daily    Family History    Family History  Problem Relation Age of Onset  . Hypertension Mother   . Alzheimer's disease Mother   . Hypertension Father   . Alzheimer's disease Father    indicated that his mother is deceased. He indicated that his father is deceased.   Social History    Social History   Socioeconomic History  . Marital status: Married    Spouse name: Not on file  . Number of children: Not on file  . Years of education: Not on file  . Highest education level: Not on file  Occupational History  . Not on file  Social Needs  . Financial resource strain: Not on file  . Food insecurity:    Worry: Not on file    Inability: Not on file  . Transportation needs:    Medical: Not on file    Non-medical: Not on file  Tobacco Use  .  Smoking status: Former Smoker    Last attempt to quit: 07/24/2007    Years since quitting: 10.2  . Smokeless tobacco: Never Used  Substance and Sexual Activity  . Alcohol use: Yes    Alcohol/week: 0.0 oz    Comment: OCC  . Drug use: No  . Sexual activity: Not on file  Lifestyle  . Physical activity:    Days per week: Not on file    Minutes per session: Not on file  . Stress: Not on file  Relationships  . Social connections:    Talks on phone: Not on file    Gets together: Not on file    Attends religious service: Not on file    Active member of club or organization: Not on file    Attends meetings of clubs or organizations: Not on file    Relationship status: Not on file  . Intimate partner violence:    Fear of current or ex partner: Not on file    Emotionally abused: Not on file    Physically abused: Not on file    Forced sexual activity: Not on file  Other Topics Concern  . Not on file  Social History Narrative   Lives in  [place]. 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  Has been participating in cardiac rehab since PCI in Feb.     Review of Systems    General:  No chills, fever, night sweats or weight changes.  Cardiovascular:  +++ rest and exertional chest pain assoc w/ dyspnea, no edema, orthopnea, palpitations, paroxysmal nocturnal dyspnea. Dermatological: No rash, lesions/masses Respiratory: No cough, +++ dyspnea in setting of chest pain Urologic: No hematuria, dysuria Abdominal:   No nausea, vomiting, diarrhea, bright red blood per rectum, melena, or hematemesis Neurologic:  No visual changes, wkns, changes in mental status. All other systems reviewed and are otherwise negative except as noted above.  Physical Exam    Blood pressure (!) 156/90, pulse (!) 58, temperature 97.8 F (36.6 C), temperature source Oral, resp. rate 17, height 5\' 7"  (1.702 m), weight 182 lb 6.4 oz (82.7 kg), SpO2 97 %.  General: Pleasant, NAD Psych: Normal affect. Neuro: Alert and oriented X 3. Moves all extremities spontaneously. HEENT: Normal  Neck: Supple without bruits or JVD. Lungs:  Resp regular and unlabored, CTA. Heart: RRR no s3, s4, or murmurs. Abdomen: Soft, non-tender, non-distended, BS + x 4.  Extremities: No clubbing, cyanosis or edema. DP/PT/Radials 2+ and equal bilaterally. Nl Allen's on right.  Labs     Recent Labs    11/05/17 2311 11/06/17 0401  TROPONINI <0.03 <0.03   Lab Results  Component Value Date   WBC 8.5 11/05/2017   HGB 13.0 11/05/2017   HCT 38.1 (L) 11/05/2017   MCV 84.4 11/05/2017   PLT 271 11/05/2017    Recent Labs  Lab 11/05/17 2311  NA 136  K 3.4*  CL 103  CO2 27  BUN 14  CREATININE 0.98  CALCIUM 9.1  GLUCOSE 107*   Lab Results  Component Value Date   CHOL 164 09/24/2017   HDL 42 09/24/2017   LDLCALC 102 (H) 09/24/2017   TRIG 99 09/24/2017    Radiology Studies    Dg Chest Port 1 View  Result Date: 11/05/2017 CLINICAL DATA:  Acute onset of  generalized chest pain. EXAM: PORTABLE CHEST 1 VIEW COMPARISON:  Chest radiograph performed 07/22/2017 FINDINGS: The lungs are well-aerated. Minimal left basilar atelectasis is noted. There is no evidence of  pleural effusion or pneumothorax. The cardiomediastinal silhouette is within normal limits. No acute osseous abnormalities are seen. IMPRESSION: Minimal left basilar atelectasis noted.  Lungs otherwise clear. Electronically Signed   By: Garald Balding M.D.   On: 11/05/2017 23:26    ECG & Cardiac Imaging    Sinus bradycardia, 54, left axis deviation, inferior T wave inversion-no acute changes.  Assessment & Plan    1.  Unstable angina/coronary artery disease: Mr. Shaff is status post inferior STEMI in December 2018 with drug-eluting stent placement to the right coronary artery x2.  He subsequently underwent drug-eluting stent placement to the ostial LAD in February due to ongoing chest discomfort and dyspnea.  He had been doing well and tolerating cardiac rehab but awoke with chest tightness radiating to his left arm and associated with dyspnea on the morning of April 15.  He had recurrent chest discomfort when mowing his lawn on April 16, thus prompting presentation to the emergency department.  Here, ECG nonacute and troponins normal.  He has had no recurrence of chest discomfort while lying in bed.  Given recent events and stenting with recurrent rest and exertional chest pain and dyspnea, repeat cardiac catheterization is warranted.  Unfortunately, he has a contrast allergy.  We will premedicate him today and plan on diagnostic catheterization in the morning.  Continue aspirin, high potency statin, Plavix, beta-blocker, and ARB therapy.  He was recently placed on Zetia in early March, in the setting of LDL not at goal.  Follow-up lipids.  2.  Blood pressure moderately elevated this morning.  Continue home medications.  I will add long-acting nitrate therapy in the setting of above.  Essential  hypertension:  3.  Hyperlipidemia: LDL was 102 on March 5.  Zetia was added at that time.  Follow-up lipids.  4.  Paroxysmal atrial fibrillation: This was noted post PCI in December and was managed with amiodarone.  As this was a very brief episode, he was not placed on oral anticoagulation.  He denies any recent palpitations.  Follow on telemetry.  CHA2DS2VASc equals 5.  5.  Hypokalemia: Supplement.  6.  Contrast allergy: He will require premedication prior to catheterization tomorrow.  Signed, Murray Hodgkins, NP 11/06/2017, 11:44 AM  For questions or updates, please contact   Please consult [email or web address] for contact info under Cardiology/STEMI.

## 2017-11-06 NOTE — Progress Notes (Signed)
To cath lab via bed.

## 2017-11-06 NOTE — Progress Notes (Signed)
Back from cath lab, unable to cath today due to Shellfish allergy

## 2017-11-06 NOTE — Progress Notes (Signed)
[place] attempted to consult with patient. Patient was unavailable. [place] will try at a later time. [place] left HCPOA information.

## 2017-11-06 NOTE — Plan of Care (Signed)
  Problem: Clinical Measurements: Goal: Will remain free from infection Outcome: Progressing Note:  Remains afebrile Goal: Diagnostic test results will improve Outcome: Progressing Note:  Troponins remain negative   Problem: Pain Managment: Goal: General experience of comfort will improve Outcome: Progressing Note:  No chest pain this morning   Problem: Cardiac: Goal: Ability to achieve and maintain adequate cardiovascular perfusion will improve Outcome: Not Progressing Note:  Pt NPO for possible cardiac catheterization today

## 2017-11-06 NOTE — H&P (Signed)
[place] [facility] NAME: Todd Weaver    MR#:  [id number]  DATE OF BIRTH:  1950-08-01  DATE OF ADMISSION:  11/05/2017  PRIMARY CARE PHYSICIAN: Glean Hess, MD   REQUESTING/REFERRING PHYSICIAN: [facility] ED  CHIEF COMPLAINT:   Chief Complaint  Patient presents with  . Chest Pain    HISTORY OF PRESENT ILLNESS:  Todd Weaver  is a 67 y.o. male with a known history of HTN, HLD, TIA, CAD/STEMI (s/p 2x RCA DES 07/18/2017, 1x LAD DES 08/65/7846), chronic systolic CHF/ICM (EF 96-29% w/ mild LVH + mild LAE as of 07/18/2017 Echo), GERD, BPH, hemorrhoids, Hx Afib (no AC), DJD/OA, ED, mild cognitive impairment who p/w 2d Hx CP. Pt states that he was woken up from his sleep on Monday (11/04/2017) morning with midchest tightness, with associated L arm numbness and diaphoresis. He endorses mild SOB, but denies N/V or LH/LOC. He states he took 8x ASA81, stayed home from work and rested. The symptoms resolved gradually over the course of 1-2hrs. He states he felt well for the rest of the day. On Tuesday (11/05/2017), pt states he was mowing his yard and trimming weeds, when he developed recurrent chest tightness and LUE numbness of similar character and severity as that which he had experienced on Monday (sans diaphoresis, though). He states his symptoms improved with rest. He states he was getting ready to shower prior to going to bed on Tuesday evening, when he again had similar symptoms, prompting hospitalization. He is symptom-free at the time of my Hx/examination. He states that the symptoms he has been experiencing for the past 2d are similar in character to the symptoms he had in 06/2017 in the setting of his STEMI, but are much less pronounced in severity on present admission. Pt's outpt Cardiologist is Dr. Fletcher Anon. He is a former smoker, and quit > 48yrs ago. He endorses medication compliance. He is otherwise well-appearing, and is in no acute distress. He  denies F/C/N/V/D/AP, palpitations, night sweats, rigors, HA/blurred vision, vertigo, LH/LOC, urinary symptoms.  PAST MEDICAL HISTORY:   Past Medical History:  Diagnosis Date  . Acute ST elevation myocardial infarction (STEMI) of inferior wall ([place]) 07/18/2017  . CAD (coronary artery disease)    a. nuc stress test 9/17: small defect of mild severity in the apex location, partially reversible and suspected to be due to artifact given intense GI uptake, EF 55-65%, low risk study; b. inferior STEMI 07/18/17  . GERD (gastroesophageal reflux disease)   . Hemorrhoid   . Hyperlipidemia   . Hypertension   . Stroke [facility]) 2011   "2 MINI STROKES"    PAST SURGICAL HISTORY:   Past Surgical History:  Procedure Laterality Date  . ANAL FISTULECTOMY N/A 12/21/2014   Procedure: FISTULECTOMY ANAL;  Surgeon: Christene Lye, MD;  Location: [facility] ORS;  Service: General;  Laterality: N/A;  . APPENDECTOMY  1975  . CORONARY STENT INTERVENTION N/A 09/09/2017   Procedure: CORONARY STENT INTERVENTION;  Surgeon: Wellington Hampshire, MD;  Location: [place] CV LAB;  Service: Cardiovascular;  Laterality: N/A;  . CORONARY/GRAFT ACUTE MI REVASCULARIZATION N/A 07/18/2017   Procedure: Coronary/Graft Acute MI Revascularization;  Surgeon: Wellington Hampshire, MD;  Location: [place] CV LAB;  Service: Cardiovascular;  Laterality: N/A;  . HEMORRHOID SURGERY N/A 12/21/2014   Procedure: HEMORRHOIDECTOMY;  Surgeon: Christene Lye, MD;  Location: [facility] ORS;  Service: General;  Laterality: N/A;  . INTRAVASCULAR ULTRASOUND/IVUS N/A 09/09/2017   Procedure:  Intravascular Ultrasound/IVUS;  Surgeon: Wellington Hampshire, MD;  Location: [place] CV LAB;  Service: Cardiovascular;  Laterality: N/A;  . KNEE SURGERY Right age 33  . LEFT HEART CATH AND CORONARY ANGIOGRAPHY N/A 07/18/2017   Procedure: LEFT HEART CATH AND CORONARY ANGIOGRAPHY;  Surgeon: Wellington Hampshire, MD;  Location: [place] CV LAB;  Service:  Cardiovascular;  Laterality: N/A;  . LEFT HEART CATH AND CORONARY ANGIOGRAPHY Left 08/19/2017   Procedure: LEFT HEART CATH AND CORONARY ANGIOGRAPHY;  Surgeon: Wellington Hampshire, MD;  Location: [place] CV LAB;  Service: Cardiovascular;  Laterality: Left;  . ROTATOR CUFF REPAIR Right 2013    SOCIAL HISTORY:   Social History   Tobacco Use  . Smoking status: Former Smoker    Last attempt to quit: 07/24/2007    Years since quitting: 10.2  . Smokeless tobacco: Never Used  Substance Use Topics  . Alcohol use: Yes    Alcohol/week: 0.0 oz    Comment: OCC    FAMILY HISTORY:   Family History  Problem Relation Age of Onset  . Hypertension Mother   . Alzheimer's disease Mother   . Hypertension Father   . Alzheimer's disease Father     DRUG ALLERGIES:   Allergies  Allergen Reactions  . Codeine Hives  . Shellfish Allergy Hives and Swelling  . Contrast Media [Iodinated Diagnostic Agents] Itching and Other (See Comments)    Whelps on tongue    REVIEW OF SYSTEMS:   Review of Systems  Constitutional: Positive for diaphoresis. Negative for chills, fever, malaise/fatigue and weight loss.  HENT: Negative for congestion, hearing loss, sore throat and tinnitus.   Eyes: Negative for blurred vision, double vision and photophobia.  Respiratory: Positive for shortness of breath. Negative for cough, hemoptysis, sputum production and wheezing.   Cardiovascular: Positive for chest pain. Negative for palpitations, orthopnea, claudication, leg swelling and PND.  Gastrointestinal: Negative for abdominal pain, blood in stool, constipation, diarrhea, heartburn, melena, nausea and vomiting.  Genitourinary: Negative for dysuria, frequency, hematuria and urgency.  Musculoskeletal: Negative for back pain, joint pain, myalgias and neck pain.  Skin: Negative for itching and rash.  Neurological: Negative for dizziness, tingling, tremors, sensory change, speech change, focal weakness, seizures, loss of  consciousness, weakness and headaches.    MEDICATIONS AT HOME:   Prior to Admission medications   Medication Sig Start Date End Date Taking? Authorizing Provider  aspirin EC 81 MG tablet Take 81 mg by mouth daily.  05/23/09   [provider]  atorvastatin (LIPITOR) 80 MG tablet Take 1 tablet (80 mg total) by mouth daily at 6 PM. 09/20/17   Plonk, Gwyndolyn Saxon, MD  clopidogrel (PLAVIX) 75 MG tablet Take 300 mg (4 tablets) by mouth the first day, then take 75 mg (1 tablet) by mouth once a day thereafter. 10/14/17   Wellington Hampshire, MD  ezetimibe (ZETIA) 10 MG tablet Take 1 tablet (10 mg total) by mouth daily. 09/25/17 12/24/17  Rise Mu, PA-C  losartan (COZAAR) 25 MG tablet Take 1 tablet (25 mg total) by mouth daily. 09/23/17   Plonk, Gwyndolyn Saxon, MD  metoprolol succinate (TOPROL-XL) 25 MG 24 hr tablet Take 0.5 tablets (12.5 mg total) by mouth daily. 09/24/17   Dunn, Areta Haber, PA-C  nitroGLYCERIN (NITROSTAT) 0.4 MG SL tablet Place 1 tablet (0.4 mg total) under the tongue every 5 (five) minutes as needed for chest pain. 07/20/17   Loletha Grayer, MD  tamsulosin (FLOMAX) 0.4 MG CAPS capsule Take 1 capsule (0.4 mg  total) by mouth daily. 09/16/17   Plonk, Gwyndolyn Saxon, MD      VITAL SIGNS:  Blood pressure 122/77, pulse (!) 54, temperature 99.8 F (37.7 C), temperature source Oral, resp. rate 11, height 5\' 7"  (1.702 m), weight 84.8 kg (187 lb), SpO2 97 %.  PHYSICAL EXAMINATION:  Physical Exam  Constitutional: He is oriented to person, place, and time. He appears well-developed and well-nourished.  Non-toxic appearance. He does not appear ill. No distress.  HENT:  Head: Normocephalic and atraumatic.  Eyes: Pupils are equal, round, and reactive to light. EOM are normal.  Neck: Normal range of motion. Neck supple. No JVD present. No thyromegaly present.  Cardiovascular: Regular rhythm, S1 normal and S2 normal.  No extrasystoles are present. Bradycardia present. Exam reveals no gallop, no S3, no S4, no distant  heart sounds and no friction rub.  No murmur heard. Pulmonary/Chest: Effort normal and breath sounds normal. No accessory muscle usage or stridor. No tachypnea. No respiratory distress. He has no decreased breath sounds. He has no wheezes. He has no rhonchi. He has no rales.  Abdominal: Soft. Bowel sounds are normal. He exhibits no distension. There is no tenderness. There is no rebound and no guarding.  Musculoskeletal: Normal range of motion.       Right lower leg: He exhibits no edema.       Left lower leg: He exhibits no edema.  Lymphadenopathy:    He has no cervical adenopathy.  Neurological: He is alert and oriented to person, place, and time. He is not disoriented. No cranial nerve deficit.  Skin: Skin is warm and dry. No rash noted. No erythema.  Psychiatric: He has a normal mood and affect. His behavior is normal. His mood appears not anxious. He is not agitated.   LABORATORY PANEL:   CBC Recent Labs  Lab 11/05/17 2311  WBC 8.5  HGB 13.0  HCT 38.1*  PLT 271   ------------------------------------------------------------------------------------------------------------------  Chemistries  Recent Labs  Lab 11/05/17 2311  NA 136  K 3.4*  CL 103  CO2 27  GLUCOSE 107*  BUN 14  CREATININE 0.98  CALCIUM 9.1   ------------------------------------------------------------------------------------------------------------------  Cardiac Enzymes Recent Labs  Lab 11/05/17 2311  TROPONINI <0.03   ------------------------------------------------------------------------------------------------------------------  RADIOLOGY:  Dg Chest Port 1 View  Result Date: 11/05/2017 CLINICAL DATA:  Acute onset of generalized chest pain. EXAM: PORTABLE CHEST 1 VIEW COMPARISON:  Chest radiograph performed 07/22/2017 FINDINGS: The lungs are well-aerated. Minimal left basilar atelectasis is noted. There is no evidence of pleural effusion or pneumothorax. The cardiomediastinal silhouette is  within normal limits. No acute osseous abnormalities are seen. IMPRESSION: Minimal left basilar atelectasis noted.  Lungs otherwise clear. Electronically Signed   By: Garald Balding M.D.   On: 11/05/2017 23:26    IMPRESSION AND PLAN:   A/P: 79M w/ recent cardiac cath (08/2017) p/w recurrent CP.  1.) Recurrent CP: Pt p/w 2d Hx midchest tightness, w/ associated diaphoresis and LUE numbness. Symptoms are similar in character but much lesser in severity when compared to those he experienced in the setting of his 06/2017 STEMI. He is pain-free and asymptomatic at the time of my Hx/examination. EKG (-) acute ST/TW abnl suggestive of ischemia. Trop-I (-) x1, 2x rpt pending. CXR (-). Tele, cardiac monitoring. C/w home ASA, Plavix, statin, Zetia, beta blocker, ARB. Cardiology consult. Symptomatic mgmt.  2.) Hypokalemia: K+ 3.4. Mild. Replete and monitor.  3.) Hyperglycemia: Glucose 107. Mild elevation. HbA1c pending.  4.) HTN/HLD/TIA/CAD/MI/CHF/ICM: c/w ASA, Plavix, statin, Zetia, beta  blocker, ARB. Mgmt as above.  5.) BPH: c/w Flomax.  6.) FEN/GI: Cardiac diet.  7.) DVT PPx: Lovenox 40mg  SQ qD.  8.) Code status: Full code.  9.) Disposition: Observation, pt expected to stay > 2 midnights.  All the records are reviewed and case discussed with ED provider. Management plans discussed with the patient, family and they are in agreement.  CODE STATUS: Full code.  TOTAL TIME TAKING CARE OF THIS PATIENT: 75 minutes.    Arta Silence M.D on 11/06/2017 at 3:07 AM  Between 7am to 6pm - Pager - [phone]  After 6pm go to [email or web address] - Proofreader  [facility]  Office  [phone]  CC: Primary care physician; Glean Hess, MD   Note: This dictation was prepared with Dragon dictation along with smaller phrase technology. Any transcriptional errors that result from this process are unintentional.

## 2017-11-06 NOTE — ED Notes (Signed)
Patient transported to 258

## 2017-11-06 NOTE — H&P (View-Only) (Signed)
Cardiology Consult    Patient ID: FAOLAN SPRINGFIELD MRN: [id number], DOB/AGE: 1951-07-03   Admit date: 11/05/2017 Date of Consult: 11/06/2017  Primary Physician: Glean Hess, MD Primary Cardiologist: Kathlyn Sacramento, MD Requesting Provider: R. Wieting, MD  Patient Profile    Todd Weaver is a 67 y.o. male with a history of coronary artery disease status post RCA and LAD stenting, hypertension, hyperlipidemia, stroke, and GERD, who is being seen today for the evaluation of chest pain at the request of Dr. Leslye Peer.  Past Medical History   Past Medical History:  Diagnosis Date  . Acute ST elevation myocardial infarction (STEMI) of inferior wall ([place]) 07/18/2017  . CAD (coronary artery disease)    a. 03/2016 MV: EF 55-65%, low risk study; b. 06/2017 Inf STEMI: LM 85d, LAD 60p, 11m, LCX 40p, RCA 99ost (3.25x18 Xience [place] DES/3.0x38 [place] DES), RPAV 60; c. 07/2017 Cath: LM 85d, LAD 85ost, 60p, 80m, RI 50ost, LCX 50p, RCA patent stent, RPAD/RPAV min irregs; c. 08/2017 PCI dLM/LAD (3.0x38 Xience [place]).  . Contrast media allergy   . GERD (gastroesophageal reflux disease)   . Hemorrhoid   . Hyperlipidemia   . Hypertension   . Ischemic cardiomyopathy    a. 06/2017 Echo: EF 45-50%, mod inf/infsept HK. Mildly dil LA.  Marland Kitchen PAF (paroxysmal atrial fibrillation) ([employer])    a. Brief episode during December hospitalization-->converted on IV amio-->not on [place].  Marland Kitchen Stroke [facility]) 2011   "2 MINI STROKES"    Past Surgical History:  Procedure Laterality Date  . ANAL FISTULECTOMY N/A 12/21/2014   Procedure: FISTULECTOMY ANAL;  Surgeon: Christene Lye, MD;  Location: [facility] ORS;  Service: General;  Laterality: N/A;  . APPENDECTOMY  1975  . CORONARY STENT INTERVENTION N/A 09/09/2017   Procedure: CORONARY STENT INTERVENTION;  Surgeon: Wellington Hampshire, MD;  Location: [place] CV LAB;  Service: Cardiovascular;  Laterality: N/A;  . CORONARY/GRAFT ACUTE MI REVASCULARIZATION N/A 07/18/2017   Procedure: Coronary/Graft Acute MI Revascularization;  Surgeon: Wellington Hampshire, MD;  Location: [place] CV LAB;  Service: Cardiovascular;  Laterality: N/A;  . HEMORRHOID SURGERY N/A 12/21/2014   Procedure: HEMORRHOIDECTOMY;  Surgeon: Christene Lye, MD;  Location: [facility] ORS;  Service: General;  Laterality: N/A;  . INTRAVASCULAR ULTRASOUND/IVUS N/A 09/09/2017   Procedure: Intravascular Ultrasound/IVUS;  Surgeon: Wellington Hampshire, MD;  Location: [place] CV LAB;  Service: Cardiovascular;  Laterality: N/A;  . KNEE SURGERY Right age 6  . LEFT HEART CATH AND CORONARY ANGIOGRAPHY N/A 07/18/2017   Procedure: LEFT HEART CATH AND CORONARY ANGIOGRAPHY;  Surgeon: Wellington Hampshire, MD;  Location: [place] CV LAB;  Service: Cardiovascular;  Laterality: N/A;  . LEFT HEART CATH AND CORONARY ANGIOGRAPHY Left 08/19/2017   Procedure: LEFT HEART CATH AND CORONARY ANGIOGRAPHY;  Surgeon: Wellington Hampshire, MD;  Location: [place] CV LAB;  Service: Cardiovascular;  Laterality: Left;  . ROTATOR CUFF REPAIR Right 2013     Allergies  Allergies  Allergen Reactions  . Codeine Hives  . Shellfish Allergy Hives and Swelling  . Contrast Media [Iodinated Diagnostic Agents] Itching and Other (See Comments)    Whelps on tongue    History of Present Illness    67 year old male with the above complex past medical history including coronary artery disease status post inferior ST segment elevation myocardial infarction in late December 2018 requiring stenting of the RCA and subsequent stenting of LAD, hypertension, hyperlipidemia, contrast allergy, GERD, and stroke.  As noted, he presented to  [facility] in December 2018 with chest pain and found to have inferior ST segment elevation w/ RV involvement and hypotension.  Catheterization revealed severe ostial RCA as well as distal left main and ostial LAD disease.  The RCA was successfully treated with a drug-eluting stent.  Initial  recommendation was made for medical therapy of distal left main/ostial LAD disease.  Post PCI, he had a brief episode of AFib in the ICU that converted with IV amio.  He recovered well and was subsequently d/c'd.  In office follow-up, he reported  Exertional chest pain and dyspnea, and underwent repeat catheterization in January, showing patency of the RCA with ongoing distal left main/ostial LAD disease.  After further review with interventional colleagues, patient subsequently underwent guided PCI and drug-eluting stent placementIVUS to the ostial and proximal LAD in mid February.  Patient has been participating in cardiac rehab since then and mostly has done well.  He says he occasionally notes an episode of exertional chest discomfort but only with very high levels of activity.  Unfortunately, earlier this week, he awoke in the early morning hours of April 15, with substernal chest tightness radiating to his left shoulder, associated with diaphoresis, and mild dyspnea.  He took a total of 8 baby aspirin over the span of 2 or 3 hours with eventual complete relief of symptoms.  He stayed out of work on April 15th but felt better on April 16.  That afternoon, he was mowing his lawn and towards the end of mowing, he developed recurrent chest tightness with radiation and numbness noted in the left shoulder and arm.  This was associated with dyspnea.  He went in his home and statin symptoms resolved within about an hour.  Sometime later in the evening, he had recurrent chest discomfort at rest, prompting him to present to the emergency department.  Symptoms resolved within an hour.  In the ER, ECG was nonacute.  Troponin was normal.  He was admitted.  He has had no recurrent chest pain and troponin has remained normal.  He is currently symptom-free.  Cardiology has been asked to evaluate.  Inpatient Medications    . aspirin EC  81 mg Oral Daily  . atorvastatin  80 mg Oral q1800  . clopidogrel  75 mg Oral Daily   . enoxaparin (LOVENOX) injection  40 mg Subcutaneous Q24H  . ezetimibe  10 mg Oral Daily  . losartan  25 mg Oral Daily  . metoprolol succinate  12.5 mg Oral Daily  . tamsulosin  0.4 mg Oral Daily    Family History    Family History  Problem Relation Age of Onset  . Hypertension Mother   . Alzheimer's disease Mother   . Hypertension Father   . Alzheimer's disease Father    indicated that his mother is deceased. He indicated that his father is deceased.   Social History    Social History   Socioeconomic History  . Marital status: Married    Spouse name: Not on file  . Number of children: Not on file  . Years of education: Not on file  . Highest education level: Not on file  Occupational History  . Not on file  Social Needs  . Financial resource strain: Not on file  . Food insecurity:    Worry: Not on file    Inability: Not on file  . Transportation needs:    Medical: Not on file    Non-medical: Not on file  Tobacco Use  .  Smoking status: Former Smoker    Last attempt to quit: 07/24/2007    Years since quitting: 10.2  . Smokeless tobacco: Never Used  Substance and Sexual Activity  . Alcohol use: Yes    Alcohol/week: 0.0 oz    Comment: OCC  . Drug use: No  . Sexual activity: Not on file  Lifestyle  . Physical activity:    Days per week: Not on file    Minutes per session: Not on file  . Stress: Not on file  Relationships  . Social connections:    Talks on phone: Not on file    Gets together: Not on file    Attends religious service: Not on file    Active member of club or organization: Not on file    Attends meetings of clubs or organizations: Not on file    Relationship status: Not on file  . Intimate partner violence:    Fear of current or ex partner: Not on file    Emotionally abused: Not on file    Physically abused: Not on file    Forced sexual activity: Not on file  Other Topics Concern  . Not on file  Social History Narrative   Lives in  [place]. 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  Has been participating in cardiac rehab since PCI in Feb.     Review of Systems    General:  No chills, fever, night sweats or weight changes.  Cardiovascular:  +++ rest and exertional chest pain assoc w/ dyspnea, no edema, orthopnea, palpitations, paroxysmal nocturnal dyspnea. Dermatological: No rash, lesions/masses Respiratory: No cough, +++ dyspnea in setting of chest pain Urologic: No hematuria, dysuria Abdominal:   No nausea, vomiting, diarrhea, bright red blood per rectum, melena, or hematemesis Neurologic:  No visual changes, wkns, changes in mental status. All other systems reviewed and are otherwise negative except as noted above.  Physical Exam    Blood pressure (!) 156/90, pulse (!) 58, temperature 97.8 F (36.6 C), temperature source Oral, resp. rate 17, height 5\' 7"  (1.702 m), weight 182 lb 6.4 oz (82.7 kg), SpO2 97 %.  General: Pleasant, NAD Psych: Normal affect. Neuro: Alert and oriented X 3. Moves all extremities spontaneously. HEENT: Normal  Neck: Supple without bruits or JVD. Lungs:  Resp regular and unlabored, CTA. Heart: RRR no s3, s4, or murmurs. Abdomen: Soft, non-tender, non-distended, BS + x 4.  Extremities: No clubbing, cyanosis or edema. DP/PT/Radials 2+ and equal bilaterally. Nl Allen's on right.  Labs     Recent Labs    11/05/17 2311 11/06/17 0401  TROPONINI <0.03 <0.03   Lab Results  Component Value Date   WBC 8.5 11/05/2017   HGB 13.0 11/05/2017   HCT 38.1 (L) 11/05/2017   MCV 84.4 11/05/2017   PLT 271 11/05/2017    Recent Labs  Lab 11/05/17 2311  NA 136  K 3.4*  CL 103  CO2 27  BUN 14  CREATININE 0.98  CALCIUM 9.1  GLUCOSE 107*   Lab Results  Component Value Date   CHOL 164 09/24/2017   HDL 42 09/24/2017   LDLCALC 102 (H) 09/24/2017   TRIG 99 09/24/2017    Radiology Studies    Dg Chest Port 1 View  Result Date: 11/05/2017 CLINICAL DATA:  Acute onset of  generalized chest pain. EXAM: PORTABLE CHEST 1 VIEW COMPARISON:  Chest radiograph performed 07/22/2017 FINDINGS: The lungs are well-aerated. Minimal left basilar atelectasis is noted. There is no evidence of  pleural effusion or pneumothorax. The cardiomediastinal silhouette is within normal limits. No acute osseous abnormalities are seen. IMPRESSION: Minimal left basilar atelectasis noted.  Lungs otherwise clear. Electronically Signed   By: Garald Balding M.D.   On: 11/05/2017 23:26    ECG & Cardiac Imaging    Sinus bradycardia, 54, left axis deviation, inferior T wave inversion-no acute changes.  Assessment & Plan    1.  Unstable angina/coronary artery disease: Mr. Miklos is status post inferior STEMI in December 2018 with drug-eluting stent placement to the right coronary artery x2.  He subsequently underwent drug-eluting stent placement to the ostial LAD in February due to ongoing chest discomfort and dyspnea.  He had been doing well and tolerating cardiac rehab but awoke with chest tightness radiating to his left arm and associated with dyspnea on the morning of April 15.  He had recurrent chest discomfort when mowing his lawn on April 16, thus prompting presentation to the emergency department.  Here, ECG nonacute and troponins normal.  He has had no recurrence of chest discomfort while lying in bed.  Given recent events and stenting with recurrent rest and exertional chest pain and dyspnea, repeat cardiac catheterization is warranted.  Unfortunately, he has a contrast allergy.  We will premedicate him today and plan on diagnostic catheterization in the morning.  Continue aspirin, high potency statin, Plavix, beta-blocker, and ARB therapy.  He was recently placed on Zetia in early March, in the setting of LDL not at goal.  Follow-up lipids.  2.  Blood pressure moderately elevated this morning.  Continue home medications.  I will add long-acting nitrate therapy in the setting of above.  Essential  hypertension:  3.  Hyperlipidemia: LDL was 102 on March 5.  Zetia was added at that time.  Follow-up lipids.  4.  Paroxysmal atrial fibrillation: This was noted post PCI in December and was managed with amiodarone.  As this was a very brief episode, he was not placed on oral anticoagulation.  He denies any recent palpitations.  Follow on telemetry.  CHA2DS2VASc equals 5.  5.  Hypokalemia: Supplement.  6.  Contrast allergy: He will require premedication prior to catheterization tomorrow.  Signed, Murray Hodgkins, NP 11/06/2017, 11:44 AM  For questions or updates, please contact   Please consult [email or web address] for contact info under Cardiology/STEMI.

## 2017-11-07 ENCOUNTER — Encounter
Admission: EM | Disposition: A | Discharge status: Home / Self Care | Payer: Self-pay | Source: Home / Self Care | Attending: Emergency Medicine

## 2017-11-07 ENCOUNTER — Telehealth: Payer: Self-pay | Admitting: Cardiovascular Disease

## 2017-11-07 ENCOUNTER — Encounter: Payer: Self-pay | Admitting: Emergency Medicine

## 2017-11-07 DIAGNOSIS — I200 Unstable angina: Secondary | ICD-10-CM

## 2017-11-07 HISTORY — PX: LEFT HEART CATH AND CORONARY ANGIOGRAPHY: CATH118249

## 2017-11-07 LAB — BASIC METABOLIC PANEL
Anion gap: 5 (ref 5–15)
BUN: 17 mg/dL (ref 6–20)
CO2: 24 mmol/L (ref 22–32)
CREATININE: 0.92 mg/dL (ref 0.61–1.24)
Calcium: 9.2 mg/dL (ref 8.9–10.3)
Chloride: 106 mmol/L (ref 101–111)
GFR calc Af Amer: >60 mL/min (ref >60)
GFR calc non Af Amer: >60 mL/min (ref >60)
GLUCOSE: 160 mg/dL — AB (ref 65–99)
Potassium: 4.3 mmol/L (ref 3.5–5.1)
Sodium: 135 mmol/L (ref 135–145)

## 2017-11-07 LAB — CBC
HCT: 39.7 % — ABNORMAL LOW (ref 40.0–52.0)
HEMOGLOBIN: 13.4 g/dL (ref 13.0–18.0)
MCH: 28.4 pg (ref 26.0–34.0)
MCHC: 33.6 g/dL (ref 32.0–36.0)
MCV: 84.5 fL (ref 80.0–100.0)
Platelets: 280 10*3/uL (ref 150–440)
RBC: 4.7 MIL/uL (ref 4.40–5.90)
RDW: 15.4 % — ABNORMAL HIGH (ref 11.5–14.5)
WBC: 7.6 10*3/uL (ref 3.8–10.6)

## 2017-11-07 LAB — LIPID PANEL
CHOL/HDL RATIO: 2.8 ratio
CHOLESTEROL: 149 mg/dL (ref 0–200)
HDL: 53 mg/dL (ref >40)
LDL Cholesterol: 92 mg/dL (ref 0–99)
Triglycerides: 20 mg/dL (ref <150)
VLDL: 4 mg/dL (ref 0–40)

## 2017-11-07 LAB — HIV ANTIBODY (ROUTINE TESTING W REFLEX): HIV SCREEN 4TH GENERATION: NONREACTIVE

## 2017-11-07 LAB — PROTIME-INR
INR: 1.11
Prothrombin Time: 14.2 seconds (ref 11.4–15.2)

## 2017-11-07 SURGERY — LEFT HEART CATH AND CORONARY ANGIOGRAPHY
Anesthesia: Moderate Sedation

## 2017-11-07 MED ORDER — FENTANYL CITRATE (PF) 100 MCG/2ML IJ SOLN
INTRAMUSCULAR | Status: DC | PRN
  Administered 2017-11-07: 25 ug via INTRAVENOUS

## 2017-11-07 MED ORDER — MIDAZOLAM HCL 2 MG/2ML IJ SOLN
INTRAMUSCULAR | Status: DC | PRN
  Administered 2017-11-07: 1 mg via INTRAVENOUS

## 2017-11-07 MED ORDER — HEPARIN SODIUM (PORCINE) 1000 UNIT/ML IJ SOLN
INTRAMUSCULAR | Status: DC | PRN
  Administered 2017-11-07: 4000 [IU] via INTRAVENOUS

## 2017-11-07 MED ORDER — SODIUM CHLORIDE 0.9% FLUSH
3.0000 mL | Freq: Two times a day (BID) | INTRAVENOUS | Status: DC
  Administered 2017-11-07: 3 mL via INTRAVENOUS

## 2017-11-07 MED ORDER — SODIUM CHLORIDE 0.9% FLUSH: 3.0000 mL | INTRAVENOUS | Status: DC | PRN

## 2017-11-07 MED ORDER — VERAPAMIL HCL 2.5 MG/ML IV SOLN
INTRAVENOUS | Status: AC
  Filled 2017-11-07: qty 2

## 2017-11-07 MED ORDER — HEPARIN (PORCINE) IN NACL 1000-0.9 UT/500ML-% IV SOLN
INTRAVENOUS | Status: AC
  Filled 2017-11-07: qty 500

## 2017-11-07 MED ORDER — ISOSORBIDE MONONITRATE ER 30 MG PO TB24: 30.0000 mg | ORAL_TABLET | Freq: Every day | ORAL | 0 refills | Status: DC

## 2017-11-07 MED ORDER — CLOPIDOGREL BISULFATE 75 MG PO TABS: ORAL_TABLET | ORAL | 3 refills | Status: DC

## 2017-11-07 MED ORDER — FENTANYL CITRATE (PF) 100 MCG/2ML IJ SOLN
INTRAMUSCULAR | Status: AC
  Filled 2017-11-07: qty 2

## 2017-11-07 MED ORDER — SODIUM CHLORIDE 0.9 % IV SOLN: INTRAVENOUS | Status: DC

## 2017-11-07 MED ORDER — IOPAMIDOL (ISOVUE-300) INJECTION 61%
INTRAVENOUS | Status: DC | PRN
  Administered 2017-11-07: 75 mL via INTRAVENOUS

## 2017-11-07 MED ORDER — MIDAZOLAM HCL 2 MG/2ML IJ SOLN
INTRAMUSCULAR | Status: AC
  Filled 2017-11-07: qty 2

## 2017-11-07 MED ORDER — NITROGLYCERIN 0.4 MG SL SUBL: 0.4000 mg | SUBLINGUAL_TABLET | SUBLINGUAL | Status: DC | PRN

## 2017-11-07 MED ORDER — SODIUM CHLORIDE 0.9 % IV SOLN: 250.0000 mL | INTRAVENOUS | Status: DC | PRN

## 2017-11-07 MED ORDER — DIPHENHYDRAMINE HCL 25 MG PO CAPS
ORAL_CAPSULE | ORAL | Status: AC
  Filled 2017-11-07: qty 2

## 2017-11-07 MED ORDER — HEPARIN SODIUM (PORCINE) 1000 UNIT/ML IJ SOLN
INTRAMUSCULAR | Status: AC
  Filled 2017-11-07: qty 1

## 2017-11-07 MED ORDER — NITROGLYCERIN 0.4 MG SL SUBL: 0.4000 mg | SUBLINGUAL_TABLET | SUBLINGUAL | 0 refills | Status: DC | PRN

## 2017-11-07 SURGICAL SUPPLY — 9 items
BAND COMPRESS 30CM LRG LONG (HEMOSTASIS) ×1
BAND ZEPHYR COMPRESS 30 LONG (HEMOSTASIS) ×2 IMPLANT
CATH INFINITI 5FR ANG PIGTAIL (CATHETERS) ×3 IMPLANT
CATH OPTITORQUE JACKY 4.0 5F (CATHETERS) ×3 IMPLANT
KIT MANI 3VAL PERCEP (MISCELLANEOUS) ×3 IMPLANT
NEEDLE PERC 21GX4CM (NEEDLE) ×3 IMPLANT
PACK CARDIAC CATH (CUSTOM PROCEDURE TRAY) ×3 IMPLANT
SHEATH RAIN RADIAL 21G 6FR (SHEATH) ×3 IMPLANT
WIRE ROSEN-J .035X260CM (WIRE) ×3 IMPLANT

## 2017-11-07 NOTE — Progress Notes (Signed)
TR band assessed at 10:30am, no bleeding or hematoma noted, TR band removed, shortly after hematoma noted, manual pressure held and cath lab staff called to bedside.  Manual pressure maintained for 10 minutes after which hematoma mostly resolved.  Under guidance of cath lab staff, plan made to keep pt in [facility] for extra 15 min for observation.  Pt and family aware.

## 2017-11-07 NOTE — Telephone Encounter (Signed)
Appointment moved to 5/7, 3:30pm with [name], PA-C. Continue to keep patient on wait list.   Patient contacted regarding discharge from [facility] on 11/07/17.  Patient understands to follow up with provider [name], PA-C on 5/7 at 3:30pm at [facility], [place]. Patient understands discharge instructions? yes Patient understands medications and regiment? yes Patient understands to bring all medications to this visit? yes

## 2017-11-07 NOTE — Progress Notes (Signed)
Patient back from [place]. No complaints of pain. Right radial access WDL, arm elevated on pillows. VSS. Family at bedside. Will continue to monitor.

## 2017-11-07 NOTE — Interval H&P Note (Signed)
Cath Lab Visit (complete for each Cath Lab visit)  Clinical Evaluation Leading to the Procedure:   ACS: Yes.   unstable angina  Non-ACS:  n/a   History and Physical Interval Note:  11/07/2017 8:05 AM  Todd Weaver  has presented today for surgery, with the diagnosis of Unstable angina  The various methods of treatment have been discussed with the patient and family. After consideration of risks, benefits and other options for treatment, the patient has consented to  Procedure(s): LEFT HEART CATH AND CORONARY ANGIOGRAPHY (N/A) as a surgical intervention .  The patient's history has been reviewed, patient examined, no change in status, stable for surgery.  I have reviewed the patient's chart and labs.  Questions were answered to the patient's satisfaction.     Kathlyn Sacramento

## 2017-11-07 NOTE — Discharge Instructions (Signed)
Nonspecific Chest Pain °Chest pain can be caused by many different conditions. There is always a chance that your pain could be related to something serious, such as a heart attack or a blood clot in your lungs. Chest pain can also be caused by conditions that are not life-threatening. If you have chest pain, it is very important to follow up with your health care provider. °What are the causes? °Causes of this condition include: °· Heartburn. °· Pneumonia or bronchitis. °· Anxiety or stress. °· Inflammation around your heart (pericarditis) or lung (pleuritis or pleurisy). °· A blood clot in your lung. °· A collapsed lung (pneumothorax). This can develop suddenly on its own (spontaneous pneumothorax) or from trauma to the chest. °· Shingles infection (varicella-zoster virus). °· Heart attack. °· Damage to the bones, muscles, and cartilage that make up your chest wall. This can include: °? Bruised bones due to injury. °? Strained muscles or cartilage due to frequent or repeated coughing or overwork. °? Fracture to one or more ribs. °? Sore cartilage due to inflammation (costochondritis). ° °What increases the risk? °Risk factors for this condition may include: °· Activities that increase your risk for trauma or injury to your chest. °· Respiratory infections or conditions that cause frequent coughing. °· Medical conditions or overeating that can cause heartburn. °· Heart disease or family history of heart disease. °· Conditions or health behaviors that increase your risk of developing a blood clot. °· Having had chicken pox (varicella zoster). ° °What are the signs or symptoms? °Chest pain can feel like: °· Burning or tingling on the surface of your chest or deep in your chest. °· Crushing, pressure, aching, or squeezing pain. °· Dull or sharp pain that is worse when you move, cough, or take a deep breath. °· Pain that is also felt in your back, neck, shoulder, or arm, or pain that spreads to any of these  areas. ° °Your chest pain may come and go, or it may stay constant. °How is this diagnosed? °Lab tests or other studies may be needed to find the cause of your pain. Your health care provider may have you take a test called an ECG (electrocardiogram). An ECG records your heartbeat patterns at the time the test is performed. You may also have other tests, such as: °· Transthoracic echocardiogram (TTE). In this test, sound waves are used to create a picture of the heart structures and to look at how blood flows through your heart. °· Transesophageal echocardiogram (TEE). This is a more advanced imaging test that takes images from inside your body. It allows your health care provider to see your heart in finer detail. °· Cardiac monitoring. This allows your health care provider to monitor your heart rate and rhythm in real time. °· Holter monitor. This is a portable device that records your heartbeat and can help to diagnose abnormal heartbeats. It allows your health care provider to track your heart activity for several days, if needed. °· Stress tests. These can be done through exercise or by taking medicine that makes your heart beat more quickly. °· Blood tests. °· Other imaging tests. ° °How is this treated? °Treatment depends on what is causing your chest pain. Treatment may include: °· Medicines. These may include: °? Acid blockers for heartburn. °? Anti-inflammatory medicine. °? Pain medicine for inflammatory conditions. °? Antibiotic medicine, if an infection is present. °? Medicines to dissolve blood clots. °? Medicines to treat coronary artery disease (CAD). °· Supportive care for conditions that   do not require medicines. This may include: °? Resting. °? Applying heat or cold packs to injured areas. °? Limiting activities until pain decreases. ° °Follow these instructions at home: °Medicines °· If you were prescribed an antibiotic, take it as told by your health care provider. Do not stop taking the  antibiotic even if you start to feel better. °· Take over-the-counter and prescription medicines only as told by your health care provider. °Lifestyle °· Do not use any products that contain nicotine or tobacco, such as cigarettes and e-cigarettes. If you need help quitting, ask your health care provider. °· Do not drink alcohol. °· Make lifestyle changes as directed by your health care provider. These may include: °? Getting regular exercise. Ask your health care provider to suggest some activities that are safe for you. °? Eating a heart-healthy diet. A registered dietitian can help you to learn healthy eating options. °? Maintaining a healthy weight. °? Managing diabetes, if necessary. °? Reducing stress, such as with yoga or relaxation techniques. °General instructions °· Avoid any activities that bring on chest pain. °· If heartburn is the cause for your chest pain, raise (elevate) the head of your bed about 6 inches (15 cm) by putting blocks under the legs. Sleeping with more pillows does not effectively relieve heartburn because it only changes the position of your head. °· Keep all follow-up visits as told by your health care provider. This is important. This includes any further testing if your chest pain does not go away. °Contact a health care provider if: °· Your chest pain does not go away. °· You have a rash with blisters on your chest. °· You have a fever. °· You have chills. °Get help right away if: °· Your chest pain is worse. °· You have a cough that gets worse, or you cough up blood. °· You have severe pain in your abdomen. °· You have severe weakness. °· You faint. °· You have sudden, unexplained chest discomfort. °· You have sudden, unexplained discomfort in your arms, back, neck, or jaw. °· You have shortness of breath at any time. °· You suddenly start to sweat, or your skin gets clammy. °· You feel nauseous or you vomit. °· You suddenly feel light-headed or dizzy. °· Your heart begins to beat  quickly, or it feels like it is skipping beats. °These symptoms may represent a serious problem that is an emergency. Do not wait to see if the symptoms will go away. Get medical help right away. Call your local emergency services (911 in the [place] Do not drive yourself to the hospital. °This information is not intended to replace advice given to you by your health care provider. Make sure you discuss any questions you have with your health care provider. °Document Released: 04/18/2005 Document Revised: 04/02/2016 Document Reviewed: 04/02/2016 °Elsevier Interactive Patient Education © 2017 [employer] Inc. ° °

## 2017-11-07 NOTE — Discharge Summary (Signed)
[facility] [place] NAME: Todd Weaver    MR#:  [id number]  DATE OF BIRTH:  12-12-1950  DATE OF ADMISSION:  11/05/2017 ADMITTING PHYSICIAN: Amelia Jo, MD  DATE OF DISCHARGE: 11/07/2017  PRIMARY CARE PHYSICIAN: Glean Hess, MD    ADMISSION DIAGNOSIS:  Unstable angina ([place]) [I20.0] Chest pain [R07.9] Chest pain, unspecified type [R07.9]  DISCHARGE DIAGNOSIS:  Active Problems:   Chest pain   Unstable angina ([place])   SECONDARY DIAGNOSIS:   Past Medical History:  Diagnosis Date  . Acute ST elevation myocardial infarction (STEMI) of inferior wall ([place]) 07/18/2017  . CAD (coronary artery disease)    a. 03/2016 MV: EF 55-65%, low risk study; b. 06/2017 Inf STEMI: LM 85d, LAD 60p, 53m, LCX 40p, RCA 99ost (3.25x18 Xience [place] DES/3.0x38 [place] DES), RPAV 60; c. 07/2017 Cath: LM 85d, LAD 85ost, 60p, 84m, RI 50ost, LCX 50p, RCA patent stent, RPAD/RPAV min irregs; c. 08/2017 PCI dLM/LAD (3.0x38 Xience [place]).  . Contrast media allergy   . GERD (gastroesophageal reflux disease)   . Hemorrhoid   . Hyperlipidemia   . Hypertension   . Ischemic cardiomyopathy    a. 06/2017 Echo: EF 45-50%, mod inf/infsept HK. Mildly dil LA.  Marland Kitchen PAF (paroxysmal atrial fibrillation) ([place])    a. Brief episode during December hospitalization-->converted on IV amio-->not on [place].  Marland Kitchen Stroke [facility]) 2011   "2 MINI STROKES"    HOSPITAL COURSE:   1.  Chest pain with history of coronary artery disease.  Troponins were negative.  Patient did have a cardiac catheterization by Dr. Fletcher Anon.  Recommended medical management.  Please see procedure report.  Continue aspirin, Plavix, beta-blocker, atorvastatin.  Prescriptions written for Imdur and nitroglycerin. 2.  Essential hypertension continue usual medications 3.  Hyperlipidemia unspecified on atorvastatin and Zetia.  LDL 92 4.  BPH on Flomax 5.  History of cardiomyopathy with mid range ejection fraction 6.  Elevated  sugar secondary to steroids given prior to procedure.   DISCHARGE CONDITIONS:   Satisfactory  CONSULTS OBTAINED:  Treatment Team:  Arta Silence, MD End, Harrell Gave, MD  DRUG ALLERGIES:   Allergies  Allergen Reactions  . Codeine Hives  . Shellfish Allergy Hives and Swelling  . Contrast Media [Iodinated Diagnostic Agents] Itching and Other (See Comments)    Whelps on tongue    DISCHARGE MEDICATIONS:   Allergies as of 11/07/2017      Reactions   Codeine Hives   Shellfish Allergy Hives, Swelling   Contrast Media [iodinated Diagnostic Agents] Itching, Other (See Comments)   Whelps on tongue      Medication List    TAKE these medications   aspirin EC 81 MG tablet Take 81 mg by mouth daily.   atorvastatin 80 MG tablet Commonly known as:  LIPITOR Take 1 tablet (80 mg total) by mouth daily at 6 PM.   clopidogrel 75 MG tablet Commonly known as:  PLAVIX 1 tablet by mouth once a day thereafter. What changed:  additional instructions   ezetimibe 10 MG tablet Commonly known as:  ZETIA Take 1 tablet (10 mg total) by mouth daily.   furosemide 20 MG tablet Commonly known as:  LASIX Take 1 tablet by mouth daily.   isosorbide mononitrate 30 MG 24 hr tablet Commonly known as:  IMDUR Take 1 tablet (30 mg total) by mouth daily.   losartan 25 MG tablet Commonly known as:  COZAAR Take 1 tablet (25 mg total) by mouth daily.  metoprolol succinate 25 MG 24 hr tablet Commonly known as:  TOPROL-XL Take 0.5 tablets (12.5 mg total) by mouth daily.   nitroGLYCERIN 0.4 MG SL tablet Commonly known as:  NITROSTAT Place 1 tablet (0.4 mg total) under the tongue every 5 (five) minutes as needed for chest pain.   potassium chloride 10 MEQ tablet Commonly known as:  K-DUR,KLOR-CON Take 1 tablet by mouth daily.   tamsulosin 0.4 MG Caps capsule Commonly known as:  FLOMAX Take 1 capsule (0.4 mg total) by mouth daily.        DISCHARGE INSTRUCTIONS:    Follow-up PMD 6  days Follow-up cardiology 1 week  If you experience worsening of your admission symptoms, develop shortness of breath, life threatening emergency, suicidal or homicidal thoughts you must seek medical attention immediately by calling 911 or calling your MD immediately  if symptoms less severe.  You Must read complete instructions/literature along with all the possible adverse reactions/side effects for all the Medicines you take and that have been prescribed to you. Take any new Medicines after you have completely understood and accept all the possible adverse reactions/side effects.   Please note  You were cared for by a hospitalist during your hospital stay. If you have any questions about your discharge medications or the care you received while you were in the hospital after you are discharged, you can call the unit and asked to speak with the hospitalist on call if the hospitalist that took care of you is not available. Once you are discharged, your primary care physician will handle any further medical issues. Please note that NO REFILLS for any discharge medications will be authorized once you are discharged, as it is imperative that you return to your primary care physician (or establish a relationship with a primary care physician if you do not have one) for your aftercare needs so that they can reassess your need for medications and monitor your lab values.    Today   CHIEF COMPLAINT:   Chief Complaint  Patient presents with  . Chest Pain    HISTORY OF PRESENT ILLNESS:  [name]  is a 67 y.o. male with a known history of CAD presents with chest pain   VITAL SIGNS:  Blood pressure 117/61, pulse (!) 56, temperature 98.6 F (37 C), temperature source Oral, resp. rate 14, height 5\' 7"  (1.702 m), weight 83.5 kg (184 lb), SpO2 97 %.   PHYSICAL EXAMINATION:  GENERAL:  67 y.o.-year-old patient lying in the bed with no acute distress.  EYES: Pupils equal, round, reactive to  light and accommodation. No scleral icterus. Extraocular muscles intact.  HEENT: Head atraumatic, normocephalic. Oropharynx and nasopharynx clear.  NECK:  Supple, no jugular venous distention. No thyroid enlargement, no tenderness.  LUNGS: Normal breath sounds bilaterally, no wheezing, rales,rhonchi or crepitation. No use of accessory muscles of respiration.  CARDIOVASCULAR: S1, S2 normal. No murmurs, rubs, or gallops.  ABDOMEN: Soft, non-tender, non-distended. Bowel sounds present. No organomegaly or mass.  EXTREMITIES: No pedal edema, cyanosis, or clubbing.  NEUROLOGIC: Cranial nerves II through XII are intact. Muscle strength 5/5 in all extremities. Sensation intact. Gait not checked.  PSYCHIATRIC: The patient is alert and oriented x 3.  SKIN: No obvious rash, lesion, or ulcer.   DATA REVIEW:   CBC Recent Labs  Lab 11/07/17 0511  WBC 7.6  HGB 13.4  HCT 39.7*  PLT 280    Chemistries  Recent Labs  Lab 11/07/17 0511  NA 135  K 4.3  CL 106  CO2 24  GLUCOSE 160*  BUN 17  CREATININE 0.92  CALCIUM 9.2    Cardiac Enzymes Recent Labs  Lab 11/06/17 1119  TROPONINI <0.03    Microbiology Results  Results for orders placed or performed during the hospital encounter of 09/09/17  MRSA PCR Screening     Status: None   Collection Time: 09/09/17  1:35 PM  Result Value Ref Range Status   MRSA by PCR NEGATIVE NEGATIVE Final    Comment:        The GeneXpert MRSA Assay (FDA approved for NASAL specimens only), is one component of a comprehensive MRSA colonization surveillance program. It is not intended to diagnose MRSA infection nor to guide or monitor treatment for MRSA infections. Performed at [facility], [address] [place], [place] [address]     RADIOLOGY:  Dg Chest Port 1 View  Result Date: 11/05/2017 CLINICAL DATA:  Acute onset of generalized chest pain. EXAM: PORTABLE CHEST 1 VIEW COMPARISON:  Chest radiograph performed 07/22/2017 FINDINGS: The  lungs are well-aerated. Minimal left basilar atelectasis is noted. There is no evidence of pleural effusion or pneumothorax. The cardiomediastinal silhouette is within normal limits. No acute osseous abnormalities are seen. IMPRESSION: Minimal left basilar atelectasis noted.  Lungs otherwise clear. Electronically Signed   By: Garald Balding M.D.   On: 11/05/2017 23:26     Management plans discussed with the patient, and he is in agreement.  CODE STATUS:     Code Status Orders  (From admission, onward)        Start     Ordered   11/06/17 0224  Full code  Continuous     11/06/17 0234    Code Status History    Date Active Date Inactive Code Status Order ID Comments User Context   09/09/2017 1130 09/10/2017 1533 Full Code [id number]  Wellington Hampshire, MD Inpatient   08/19/2017 1142 08/19/2017 1718 Full Code [id number]  Wellington Hampshire, MD Inpatient   07/22/2017 0932 07/22/2017 1740 Full Code [id number]  Loletha Grayer, MD ED   07/18/2017 0859 07/20/2017 1624 Full Code [id number]  Wellington Hampshire, MD Inpatient      TOTAL TIME TAKING CARE OF THIS PATIENT: 35 minutes.    Loletha Grayer M.D on 11/07/2017 at 11:29 AM  Between 7am to 6pm - Pager - [phone]  After 6pm go to [email or web address] - password [email or web address] [facility]  [phone]  CC: Primary care physician; Glean Hess, MD

## 2017-11-07 NOTE — Telephone Encounter (Signed)
TCM....  Patient is being discharged   They saw C. Sharolyn Douglas  They are scheduled to see R. Dunn on 5/14  They were seen for unstable angina/coronary artery disease  They need to be seen within 1 week  Pt added to wait list   Please call

## 2017-11-07 NOTE — Progress Notes (Signed)
Patient ambulated 3 laps around nurses station with no chest pain or shortness of breath. Radial site access WDL. Will proceed with discharge as ordered.  [name] to be D/C'd Home per MD order. Patient given discharge teaching and paperwork regarding medications, diet, follow-up appointments and activity. Patient understanding verbalized. No questions or complaints at this time. Skin condition as charted. IV and telemetry removed prior to leaving.   An After Visit Summary was printed and given to the patient. Caregiver/family present during discharge teaching.     Terrilyn Saver

## 2017-11-08 ENCOUNTER — Telehealth: Payer: Self-pay

## 2017-11-08 NOTE — Telephone Encounter (Signed)
Transition Care Management Follow-Up Telephone Call   Date discharged and where: 11/07/17 from [facility]  How have you been since you were released from the hospital?  States he is feeling much better. Further states he is currently walking his dog. Denies any chest pain, shortness of breath, dizziness or tingling in extremities or face.  Any patient concerns? No  Items Reviewed: Verified = V   Meds: V  Allergies: V  Dietary Orders: Heart Healthy diet. Verbalized acceptance and understanding about dietary limitations and restrictions.  Functional Questionnaire:  Independent = I Dependent = D  ADLs: I  Dressing- I   Eating- I  Maintaining continence- I  Transferring- I  Transportation- I  Meal Prep- I  Managing Meds- I  Additional Items Reviewed:  Hospital f/u appt confirmed? Yes. Scheduled to see Dr. Army Melia on 11/12/17 @ 9:00am. Denies need for transportation arrangements. Also states he has scheduled his f/u appt with Dr. Fletcher Anon for 11/26/17.  If their condition worsens, is the pt aware to call PCP or go to the Emergency Dept.? Yes. Verbalized acceptance and understanding  Was the patient provided with contact information for the PCP's office or ED? Yes  Was to pt encouraged to call back with questions or concerns? Yes

## 2017-11-11 ENCOUNTER — Encounter: Payer: Self-pay | Admitting: Internal Medicine

## 2017-11-12 ENCOUNTER — Inpatient Hospital Stay: Payer: Self-pay | Admitting: Family Medicine

## 2017-11-16 ENCOUNTER — Other Ambulatory Visit: Payer: Self-pay | Admitting: Cardiovascular Disease

## 2017-11-20 ENCOUNTER — Other Ambulatory Visit: Payer: Self-pay

## 2017-11-20 ENCOUNTER — Ambulatory Visit: Payer: 59 | Admitting: Internal Medicine

## 2017-11-20 ENCOUNTER — Encounter: Payer: Self-pay | Admitting: Internal Medicine

## 2017-11-20 VITALS — BP 116/80 | HR 53 | Resp 16 | Ht 67.0 in | Wt 183.0 lb

## 2017-11-20 DIAGNOSIS — I10 Essential (primary) hypertension: Secondary | ICD-10-CM

## 2017-11-20 DIAGNOSIS — I2510 Atherosclerotic heart disease of native coronary artery without angina pectoris: Secondary | ICD-10-CM | POA: Diagnosis not present

## 2017-11-20 DIAGNOSIS — E785 Hyperlipidemia, unspecified: Secondary | ICD-10-CM

## 2017-11-20 NOTE — Progress Notes (Signed)
Date:  11/20/2017   Name:  Todd Weaver   DOB:  1950-08-13   MRN:  [id number]   Chief Complaint: Hospitalization Follow-up (Chest pain due to angina/ much better and f/u with Dr. Fletcher Anon. ) and Hypertension (bp low- He has had a few dizzy spells off and on and very tired and fatigue. ? BP meds ) Hospital follow up. Admitted to [facility] 11/05/17 to 11/07/17.  He received a TOC call on 11/08/17. Discharge summary: 1.  Chest pain with history of coronary artery disease.  Troponins were negative.  Patient did have a cardiac catheterization by Dr. Fletcher Anon.  Recommended medical management.  Please see procedure report.  Continue aspirin, Plavix, beta-blocker, atorvastatin.  Prescriptions written for Imdur and nitroglycerin. 2.  Essential hypertension continue usual medications 3.  Hyperlipidemia unspecified on atorvastatin and Zetia.  LDL 92 4.  BPH on Flomax 5.  History of cardiomyopathy with mid range ejection fraction 6.  Elevated sugar secondary to steroids given prior to procedure. He has follow up with cardiology on 11/26/17 - Dr. Fletcher Anon.  Cath result: 1.  Widely patent LAD and RCA stents with no significant restenosis.  There is a relatively small size ramus br which is pinched by the LAD stent with 70% ostial stenosis.  No other obstructive disease. 2.  Low normal LV systolic function with an EF of 50-55% with mild inferior wall hypokinesis.  Mildly elevated left ventricular end-diastolic pressure.  Since hospital stay, he has returned to work.  He has been slightly light headed, esp after going from sitting to standing.  He worked out at [employer] yesterday and felt fine.  He admits to not drinking much water.  His medications are the same - no dose changes have been a made. Lab Results  Component Value Date   HGBA1C 5.8 (H) 11/06/2017   Lab Results  Component Value Date   CHOL 149 11/07/2017   HDL 53 11/07/2017   LDLCALC 92 11/07/2017   LDLDIRECT 116 (H) 09/24/2017   TRIG 20 11/07/2017   CHOLHDL 2.8 11/07/2017   Lab Results  Component Value Date   CREATININE 0.92 11/07/2017   BUN 17 11/07/2017   NA 135 11/07/2017   K 4.3 11/07/2017   CL 106 11/07/2017   CO2 24 11/07/2017      Review of Systems  Constitutional: Negative for chills, fatigue and fever.  Respiratory: Negative for cough, chest tightness and shortness of breath.   Cardiovascular: Negative for chest pain, palpitations and leg swelling.  Gastrointestinal: Negative for abdominal pain and constipation.  Genitourinary: Negative for dysuria, frequency and urgency.  Skin: Negative for color change and rash.  Neurological: Positive for light-headedness. Negative for dizziness and headaches.  Psychiatric/Behavioral: Negative for dysphoric mood and sleep disturbance. The patient is not nervous/anxious.     Patient Active Problem List   Diagnosis Date Noted  . Unstable angina ([place])   . Daytime sleepiness 09/23/2017  . Contraindication to percutaneous coronary intervention (PCI) 09/09/2017  . Coronary artery disease 08/15/2017  . Shortness of breath 07/22/2017  . STEMI (ST elevation myocardial infarction) ([place]) 07/18/2017  . Hypertension 06/19/2017  . Hyperlipidemia 06/19/2017  . History of CVA (cerebrovascular accident) 06/19/2017  . Overweight (BMI 25.0-29.9) 06/19/2017  . BPH (benign prostatic hyperplasia) 06/19/2017  . ED (erectile dysfunction) 06/19/2017  . MCI (mild cognitive impairment) 06/19/2017  . FH: Alzheimer's disease 06/19/2017  . Lumbar spondylosis with myelopathy 11/08/2016  . Hand dermatitis 07/26/2014    Prior to Admission medications  Medication Sig Start Date End Date Taking? Authorizing Provider  aspirin EC 81 MG tablet Take 81 mg by mouth daily.  05/23/09   [provider]  atorvastatin (LIPITOR) 80 MG tablet Take 1 tablet (80 mg total) by mouth daily at 6 PM. 09/20/17   Plonk, Gwyndolyn Saxon, MD  betamethasone dipropionate (DIPROLENE) 0.05 % ointment APPLY TO THE AFFECTED AREA 2  TIMES DAILY 11/14/17   [provider]  clopidogrel (PLAVIX) 75 MG tablet 1 tablet by mouth once a day thereafter. 11/07/17   Loletha Grayer, MD  ezetimibe (ZETIA) 10 MG tablet Take 1 tablet (10 mg total) by mouth daily. 09/25/17 12/24/17  Rise Mu, PA-C  furosemide (LASIX) 20 MG tablet Take 1 tablet by mouth daily. 10/18/17   [provider]  isosorbide mononitrate (IMDUR) 30 MG 24 hr tablet Take 1 tablet (30 mg total) by mouth daily. 11/07/17   Loletha Grayer, MD  losartan (COZAAR) 25 MG tablet Take 1 tablet (25 mg total) by mouth daily. 09/23/17   Plonk, Gwyndolyn Saxon, MD  metoprolol succinate (TOPROL-XL) 25 MG 24 hr tablet Take 0.5 tablets (12.5 mg total) by mouth daily. 09/24/17   Dunn, Areta Haber, PA-C  metoprolol tartrate (LOPRESSOR) 25 MG tablet TAKE 1/2 TABLET(12.5 MG) BY MOUTH TWICE DAILY 11/18/17   Dunn, Areta Haber, PA-C  nitroGLYCERIN (NITROSTAT) 0.4 MG SL tablet Place 1 tablet (0.4 mg total) under the tongue every 5 (five) minutes as needed for chest pain. 11/07/17   Loletha Grayer, MD  potassium chloride (K-DUR,KLOR-CON) 10 MEQ tablet Take 1 tablet by mouth daily. 10/18/17   [provider]  tamsulosin (FLOMAX) 0.4 MG CAPS capsule Take 1 capsule (0.4 mg total) by mouth daily. 09/16/17   Adline Potter, MD    Allergies  Allergen Reactions  . Codeine Hives  . Shellfish Allergy Hives and Swelling  . Contrast Media [Iodinated Diagnostic Agents] Itching and Other (See Comments)    Whelps on tongue    Past Surgical History:  Procedure Laterality Date  . ANAL FISTULECTOMY N/A 12/21/2014   Procedure: FISTULECTOMY ANAL;  Surgeon: Christene Lye, MD;  Location: [facility];  Service: General;  Laterality: N/A;  . APPENDECTOMY  1975  . CORONARY STENT INTERVENTION N/A 09/09/2017   Procedure: CORONARY STENT INTERVENTION;  Surgeon: Wellington Hampshire, MD;  Location: [place] [facility];  Service: Cardiovascular;  Laterality: N/A;  . CORONARY/GRAFT ACUTE MI REVASCULARIZATION N/A  07/18/2017   Procedure: Coronary/Graft Acute MI Revascularization;  Surgeon: Wellington Hampshire, MD;  Location: [place] [facility];  Service: Cardiovascular;  Laterality: N/A;  . HEMORRHOID SURGERY N/A 12/21/2014   Procedure: HEMORRHOIDECTOMY;  Surgeon: Christene Lye, MD;  Location: [facility];  Service: General;  Laterality: N/A;  . INTRAVASCULAR ULTRASOUND/IVUS N/A 09/09/2017   Procedure: Intravascular Ultrasound/IVUS;  Surgeon: Wellington Hampshire, MD;  Location: [place] [facility];  Service: Cardiovascular;  Laterality: N/A;  . KNEE SURGERY Right age 58  . LEFT HEART CATH AND CORONARY ANGIOGRAPHY N/A 07/18/2017   Procedure: LEFT HEART CATH AND CORONARY ANGIOGRAPHY;  Surgeon: Wellington Hampshire, MD;  Location: [place] [facility];  Service: Cardiovascular;  Laterality: N/A;  . LEFT HEART CATH AND CORONARY ANGIOGRAPHY Left 08/19/2017   Procedure: LEFT HEART CATH AND CORONARY ANGIOGRAPHY;  Surgeon: Wellington Hampshire, MD;  Location: [place] [facility];  Service: Cardiovascular;  Laterality: Left;  . LEFT HEART CATH AND CORONARY ANGIOGRAPHY N/A 11/07/2017   Procedure: LEFT HEART CATH AND CORONARY ANGIOGRAPHY;  Surgeon: Kathlyn Sacramento  A, MD;  Location: [place] [facility];  Service: Cardiovascular;  Laterality: N/A;  . ROTATOR CUFF REPAIR Right 2013    Social History   Tobacco Use  . Smoking status: Former Smoker    Last attempt to quit: 07/24/2007    Years since quitting: 10.3  . Smokeless tobacco: Never Used  Substance Use Topics  . Alcohol use: Yes    Alcohol/week: 0.0 oz    Comment: OCC  . Drug use: No     Medication list has been reviewed and updated.  PHQ 2/9 Scores 10/23/2017 08/01/2017 06/19/2017  PHQ - 2 Score 1 0 0  PHQ- 9 Score 5 2 -    Physical Exam  Constitutional: He is oriented to person, place, and time. He appears well-developed. No distress.  HENT:  Head: Normocephalic and atraumatic.  Neck: Normal range of motion. Neck supple. Carotid bruit is not present.   Cardiovascular: Normal rate, regular rhythm and normal heart sounds.  Pulmonary/Chest: Effort normal and breath sounds normal. No respiratory distress.  Abdominal: Soft. Bowel sounds are normal.  Musculoskeletal: Normal range of motion. He exhibits no edema or tenderness.  Neurological: He is alert and oriented to person, place, and time.  Skin: Skin is warm and dry. No rash noted.  Psychiatric: He has a normal mood and affect. His behavior is normal. Thought content normal.    BP 92/60   Pulse (!) 53   Resp 16   Ht 5\' 7"  (1.702 m)   Wt 183 lb (83 kg)   SpO2 97%   BMI 28.66 kg/m   Assessment and Plan: 1. Coronary artery disease involving native coronary artery of native heart without angina pectoris Doing well will mild orthostatic sx - encourage more water intake, esp before exercise If sx worsen, call or follow up  2. Hyperlipidemia, unspecified hyperlipidemia type On statin therapy  3. Essential hypertension controlled   No orders of the defined types were placed in this encounter.   Partially dictated using Editor, commissioning. Any errors are unintentional.  Halina Maidens, MD [facility]  11/20/2017

## 2017-11-26 ENCOUNTER — Ambulatory Visit (INDEPENDENT_AMBULATORY_CARE_PROVIDER_SITE_OTHER): Payer: 59 | Admitting: Physician Assistant

## 2017-11-26 ENCOUNTER — Encounter: Payer: Self-pay | Admitting: Physician Assistant

## 2017-11-26 VITALS — BP 120/52 | HR 50 | Ht 67.0 in | Wt 188.0 lb

## 2017-11-26 DIAGNOSIS — R42 Dizziness and giddiness: Secondary | ICD-10-CM | POA: Diagnosis not present

## 2017-11-26 DIAGNOSIS — E785 Hyperlipidemia, unspecified: Secondary | ICD-10-CM | POA: Diagnosis not present

## 2017-11-26 DIAGNOSIS — I25118 Atherosclerotic heart disease of native coronary artery with other forms of angina pectoris: Secondary | ICD-10-CM | POA: Diagnosis not present

## 2017-11-26 DIAGNOSIS — I255 Ischemic cardiomyopathy: Secondary | ICD-10-CM | POA: Diagnosis not present

## 2017-11-26 DIAGNOSIS — Z79899 Other long term (current) drug therapy: Secondary | ICD-10-CM | POA: Diagnosis not present

## 2017-11-26 DIAGNOSIS — I5022 Chronic systolic (congestive) heart failure: Secondary | ICD-10-CM

## 2017-11-26 DIAGNOSIS — I10 Essential (primary) hypertension: Secondary | ICD-10-CM | POA: Diagnosis not present

## 2017-11-26 DIAGNOSIS — I480 Paroxysmal atrial fibrillation: Secondary | ICD-10-CM | POA: Diagnosis not present

## 2017-11-26 MED ORDER — METOPROLOL SUCCINATE ER 25 MG PO TB24: 12.5000 mg | ORAL_TABLET | Freq: Every day | ORAL | 3 refills | Status: DC

## 2017-11-26 NOTE — Progress Notes (Signed)
Cardiology Office Note Date:  11/26/2017  Patient ID:  Todd Weaver, Todd Weaver 03-18-1951, MRN [id number] PCP:  Glean Hess, MD  Cardiologist:  Dr. Fletcher Anon, MD    Chief Complaint: Hospital follow-up  History of Present Illness: Todd Weaver is a 67 y.o. male with history of recent inferior ST elevation MI on 07/18/2017 s/p PCI/DES x 2 to the RCA with residual disease involving the distal left main/ostial LAD, proximal LAD, mid LAD, and LCx as detailed below, ICM/chronic systolic CHF, PAF, "mild strokes" x 2 ~ 8-10 years prior, HLD, prior tobacco abuse, ED, back pain, and BPH who presents for hospital follow-up after recent admission from 4/16 through 4/18 for unstable angina.   Patient was previously seen by Dr. Yvone Neu for hypertension and bradycardia. He was initially seen in 02/2016 for evaluation of fatigue, weakness, and SOB. Heart rate noted to be 49 bpm at initial visit on atenolol. Beta blocker was reduced. He underwent nuclear stress testing in 03/2016 that showed a small defect of mild severity in the apex location, partially reversible and suspected to be due to artifact given intense GI uptake, EF 55-65%. Overall, this was a low risk study. Echo in 03/2016 showed an EF of 55-60%, normal wall motion, normal LV diastolic function, no significant valvular abnormalities. He was seen on 09/25/2016 for follow up and was doing well at that time, improved off beta blocker. He was admitted to [facility] on 07/18/2017 with an acute inferior ST elevation MI complicated by VF x 2 requiring brief episode of CPR and defibrillation x 2. He underwent successful PCI/DES x 2 to the RCA with residual 85% stenosed distal left main/ostial LAD, proximal LAD 60% stenosed, mid LAD 40% stenosed, proximal LCx 40% stenosed, post atrio 60% stenosed. Case was complicated by hypotension throughout the PCI likely due to RV involvement that responded to IV fluids and norepinephrine gtt. Troponin peaked at 44.68 Post-procedure, in  the ICU, he developed new onset Afib with RVR that was successfully treated with IV amiodarone, leading to conversion to sinus rhythm. He was not placed on triple therapy given this was a brief episode of Afib. Post-procedure echo showed an EF of 45-50%, moderate hypokinesis of the inferior and inferoseptal myocardium. He was gently diuresed with improvement in breathing. He was discharged on DAPT with ASA and Brilinta along with amiodarone, Lipitor, losartan, magnesium oxide, Lopressor, SL NTG, along with his noncardiac medications. Discharge weight of 187 pounds. He returned to [facility] 12/31 with 30-45 minutes of SOB with left hand parasthesias. Symptoms did not feel like prior MI. Troponin was noted to still be down trending from prior, recent admission. EKG was not acute. He was asymptomatic in the ED. ED weight of 187 pounds. It was felt his SOB may have been mild pulmonary edema and it was recommended he take Lasix 20 mg daily with KCl 10 mEq daily. He was seen in the office on 08/15/2017 for hospital follow up and noted exertional chest pain and SOB. He was compliant with all medications. He underwent repeat LHC on 08/19/2017 that showed significant two-vessel CAD with widely patient stents in the RCA without any restenosis. Significant ostial LAD stenosis was unchanged with moderate disease in the proximal and mid LAD segment. Case was discussed at conference with recommendation to proceed with IVUS-guided intervention of the LAD. He presented to [facility] 08/30/17 for planned outpatient diagnostic LHC that showed left main, minimal luminal irregs, distal left main to ostial LAD 85%, proximal LAD 60%, mid LAD  30%, ostial ramus 50%, proximal LCx 40%, patent stent in the RCA. He underwent successful IVUS-guided PCI/DES to the ostial and proximal LAD with one long stent ([place] 3.0 x 38 mm).  In hospital follow-up on 3/5 he was doing well.  He has been participating in cardiac rehab.  He was admitted on 4/16 after  developing an episode of chest tightness radiating to his left arm on 4/15.  He had recurrent chest discomfort when mowing his lawn on 4/16 prompting his presentation to the hospital where he was admitted.  EKG was nonacute and troponins were normal.  He underwent repeat cardiac catheterization on 11/07/2017 that showed widely patent LAD and RCA stents with no significant restenosis.  There was a relatively small size ramus branch which was pinched by the LAD stent with 70% ostial stenosis.  Otherwise, there was no obstructive disease.  Low normal LV systolic function with EF of 50 to 55% with mild inferior wall hypokinesis and mildly elevated LVEDP.  Continued medical therapy was advised.  LHC/418/19: Conclusion     Mid LAD lesion is 30% stenosed.  Previously placed Prox LAD drug eluting stent is widely patent.  Prox Cx lesion is 40% stenosed.  Non-stenotic Ost RCA to Mid RCA lesion previously treated.  Ost Ramus lesion is 70% stenosed.  Dist LAD lesion is 40% stenosed.  LV end diastolic pressure is mildly elevated.  There is mild left ventricular systolic dysfunction.  The left ventricular ejection fraction is 50-55% by visual estimate.    He comes in doing well today from a cardiac perspective.  He has not had any further chest pain.  No shortness of breath.  He reports the above chest pain that led to his hospital admission was not similar to his prior episode of chest pain leading to his PCI's.  He has been compliant with all medications including dual antiplatelet therapy, not missing any doses.  He reports a well-controlled blood pressure at home though cannot provide numbers for review.  He does indicate episodes of positional dizziness if he stands too quickly.  He has previously been noted to have beta-blocker induced bradycardia on low-dose metoprolol.  He is currently taking Toprol-XL 25 mg once daily.  He is tolerating Imdur without issues.  He continues to advance activities  as tolerated without issues.  Just this past weekend, he and some friends drove up to [place], [place] and went fishing.  Between the 7 of them they caught approximately 20 fish.  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  He officially retired from work today.   Past Medical History:  Diagnosis Date  . Acute ST elevation myocardial infarction (STEMI) of inferior wall ([place]) 07/18/2017  . CAD (coronary artery disease)    a. 03/2016 MV: EF 55-65%, low risk study; b. 06/2017 Inf STEMI: LM 85d, LAD 60p, 4m, LCX 40p, RCA 99ost (3.25x18 Xience [place] DES/3.0x38 [place] DES), RPAV 60; c. 07/2017 Cath: LM 85d, LAD 85ost, 60p, 1m, RI 50ost, LCX 50p, RCA patent stent, RPAD/RPAV min irregs; c. 08/2017 PCI dLM/LAD (3.0x38 Xience [place]).  . Contrast media allergy   . GERD (gastroesophageal reflux disease)   . Hemorrhoid   . Hyperlipidemia   . Hypertension   . Ischemic cardiomyopathy    a. 06/2017 Echo: EF 45-50%, mod inf/infsept HK. Mildly dil LA.  Marland Kitchen PAF (paroxysmal atrial fibrillation) ([place])    a. Brief episode during December hospitalization-->converted on IV amio-->not on [place].  Marland Kitchen Stroke [facility]) 2011   "2 MINI  STROKES"    Past Surgical History:  Procedure Laterality Date  . ANAL FISTULECTOMY N/A 12/21/2014   Procedure: FISTULECTOMY ANAL;  Surgeon: Christene Lye, MD;  Location: [facility] ORS;  Service: General;  Laterality: N/A;  . APPENDECTOMY  1975  . CORONARY STENT INTERVENTION N/A 09/09/2017   Procedure: CORONARY STENT INTERVENTION;  Surgeon: Wellington Hampshire, MD;  Location: [place] CV LAB;  Service: Cardiovascular;  Laterality: N/A;  . CORONARY/GRAFT ACUTE MI REVASCULARIZATION N/A 07/18/2017   Procedure: Coronary/Graft Acute MI Revascularization;  Surgeon: Wellington Hampshire, MD;  Location: [place] CV LAB;  Service: Cardiovascular;  Laterality: N/A;  . HEMORRHOID SURGERY N/A 12/21/2014   Procedure: HEMORRHOIDECTOMY;  Surgeon: Christene Lye, MD;  Location: [facility]  ORS;  Service: General;  Laterality: N/A;  . INTRAVASCULAR ULTRASOUND/IVUS N/A 09/09/2017   Procedure: Intravascular Ultrasound/IVUS;  Surgeon: Wellington Hampshire, MD;  Location: [place] CV LAB;  Service: Cardiovascular;  Laterality: N/A;  . KNEE SURGERY Right age 49  . LEFT HEART CATH AND CORONARY ANGIOGRAPHY N/A 07/18/2017   Procedure: LEFT HEART CATH AND CORONARY ANGIOGRAPHY;  Surgeon: Wellington Hampshire, MD;  Location: [place] CV LAB;  Service: Cardiovascular;  Laterality: N/A;  . LEFT HEART CATH AND CORONARY ANGIOGRAPHY Left 08/19/2017   Procedure: LEFT HEART CATH AND CORONARY ANGIOGRAPHY;  Surgeon: Wellington Hampshire, MD;  Location: [place] CV LAB;  Service: Cardiovascular;  Laterality: Left;  . LEFT HEART CATH AND CORONARY ANGIOGRAPHY N/A 11/07/2017   Procedure: LEFT HEART CATH AND CORONARY ANGIOGRAPHY;  Surgeon: Wellington Hampshire, MD;  Location: [place] CV LAB;  Service: Cardiovascular;  Laterality: N/A;  . ROTATOR CUFF REPAIR Right 2013    Current Meds  Medication Sig  . aspirin EC 81 MG tablet Take 81 mg by mouth daily.   Marland Kitchen atorvastatin (LIPITOR) 80 MG tablet Take 1 tablet (80 mg total) by mouth daily at 6 PM.  . betamethasone dipropionate (DIPROLENE) 0.05 % ointment APPLY TO THE AFFECTED AREA 2 TIMES DAILY  . clopidogrel (PLAVIX) 75 MG tablet 1 tablet by mouth once a day thereafter.  . ezetimibe (ZETIA) 10 MG tablet Take 1 tablet (10 mg total) by mouth daily.  . furosemide (LASIX) 20 MG tablet Take 1 tablet by mouth daily.  . isosorbide mononitrate (IMDUR) 30 MG 24 hr tablet Take 1 tablet (30 mg total) by mouth daily.  Marland Kitchen losartan (COZAAR) 25 MG tablet Take 1 tablet (25 mg total) by mouth daily.  . nitroGLYCERIN (NITROSTAT) 0.4 MG SL tablet Place 1 tablet (0.4 mg total) under the tongue every 5 (five) minutes as needed for chest pain.  . potassium chloride (K-DUR,KLOR-CON) 10 MEQ tablet Take 1 tablet by mouth daily.  . tamsulosin (FLOMAX) 0.4 MG CAPS capsule Take  1 capsule (0.4 mg total) by mouth daily.  . [DISCONTINUED] metoprolol succinate (TOPROL-XL) 25 MG 24 hr tablet Take 0.5 tablets (12.5 mg total) by mouth daily. (Patient taking differently: Take 25 mg by mouth daily. )    Allergies:   Codeine; Shellfish allergy; and Contrast media [iodinated diagnostic agents]   Social History:  The patient  reports that he quit smoking about 10 years ago. He has never used smokeless tobacco. He reports that he drinks alcohol. He reports that he does not use drugs.   Family History:  The patient's family history includes Alzheimer's disease in his father and mother; Hypertension in his father and mother.  ROS:   Review of Systems  Constitutional: Positive for malaise/fatigue. Negative  for chills, diaphoresis, fever and weight loss.  HENT: Negative for congestion.   Eyes: Negative for discharge and redness.  Respiratory: Negative for cough, hemoptysis, sputum production, shortness of breath and wheezing.   Cardiovascular: Negative for chest pain, palpitations, orthopnea, claudication, leg swelling and PND.  Gastrointestinal: Negative for abdominal pain, blood in stool, heartburn, melena, nausea and vomiting.  Genitourinary: Negative for hematuria.  Musculoskeletal: Negative for falls and myalgias.  Skin: Negative for rash.  Neurological: Positive for dizziness. Negative for tingling, tremors, sensory change, speech change, focal weakness, loss of consciousness and weakness.  Endo/Heme/Allergies: Does not bruise/bleed easily.  Psychiatric/Behavioral: Negative for substance abuse. The patient is not nervous/anxious.   All other systems reviewed and are negative.    PHYSICAL EXAM:  VS:  BP (!) 120/52 (BP Location: Left Arm, Patient Position: Sitting, Cuff Size: Normal)   Pulse (!) 50   Ht 5\' 7"  (1.702 m)   Wt 188 lb (85.3 kg)   BMI 29.44 kg/m  BMI: Body mass index is 29.44 kg/m.  Physical Exam  Constitutional: He is oriented to person, place, and  time. He appears well-developed and well-nourished.  HENT:  Head: Normocephalic and atraumatic.  Eyes: Right eye exhibits no discharge. Left eye exhibits no discharge.  Neck: Normal range of motion. No JVD present.  Cardiovascular: Normal rate, regular rhythm, S1 normal, S2 normal and normal heart sounds. Exam reveals no distant heart sounds, no friction rub, no midsystolic click and no opening snap.  No murmur heard. Pulses:      Radial pulses are 2+ on the right side.       Posterior tibial pulses are 2+ on the right side, and 2+ on the left side.  Right radial cardiac cath site well healing without active bleeding, bruising, swelling, erythema, warmth, or tenderness to palpation.  Radial pulse 2+.  Pulmonary/Chest: Effort normal and breath sounds normal. No respiratory distress. He has no decreased breath sounds. He has no wheezes. He has no rales. He exhibits no tenderness.  Abdominal: Soft. He exhibits no distension. There is no tenderness.  Musculoskeletal: He exhibits no edema.  Neurological: He is alert and oriented to person, place, and time.  Skin: Skin is warm and dry. No cyanosis. Nails show no clubbing.  Psychiatric: He has a normal mood and affect. His speech is normal and behavior is normal. Judgment and thought content normal.     EKG:  Was ordered and interpreted by me today. Shows sinus bradycardia, 50 bpm, nonspecific inferolateral ST-T change (consistent with prior tracing)  Recent Labs: 07/19/2017: TSH 1.232 07/20/2017: Magnesium 1.9 09/24/2017: ALT 23 11/07/2017: BUN 17; Creatinine, Ser 0.92; Hemoglobin 13.4; Platelets 280; Potassium 4.3; Sodium 135  09/24/2017: LDL Direct 116 11/07/2017: Cholesterol 149; HDL 53; LDL Cholesterol 92; Total CHOL/HDL Ratio 2.8; Triglycerides 20; VLDL 4   Estimated Creatinine Clearance: 83.6 mL/min (by C-G formula based on SCr of 0.92 mg/dL).   Wt Readings from Last 3 Encounters:  11/26/17 188 lb (85.3 kg)  11/20/17 183 lb (83 kg)    11/07/17 184 lb (83.5 kg)     Other studies reviewed: Additional studies/records reviewed today include: summarized above  ASSESSMENT AND PLAN:  1. CAD involving the native coronary arteries with stable angina: Currently without symptoms concerning for angina.  Recent cardiac catheterization in 10/2017 demonstrating patent stents, a relatively small in size ramus branch which was pinched by the LAD stent with 70% ostial stenosis, otherwise no obstructive disease.  Continued medical management is recommended with Imdur  30 mg daily which can be titrated as needed for recurrent symptoms, metoprolol succinate as detailed below, sublingual nitroglycerin as needed for antianginal therapy.  Continue dual antiplatelet therapy with aspirin and Plavix.  No plans for further ischemic evaluation at this time.  2. Chronic systolic CHF due to ischemic cardia myopathy: He does not appear to be volume overloaded at this time.  Continue losartan 12.5 mg daily as well as reduced dose of metoprolol succinate 12.5 mg daily, Lasix 20 mg daily.  Not on spironolactone given relative soft blood pressure.  Check BMP.  Consider repeat limited echocardiogram at follow-up.  CHF education.  3. PAF: Noted post MI with associated hypotension in 06/2017.  No evidence of recurrent arrhythmia since then.  Not on long-term, full dose oral anticoagulation given this was an isolated event.  Should he redevelop A. fib in the future oral anticoagulation would need to be considered.  4. Dizziness//fatigue: Likely in the setting of orthostasis given his relative soft blood pressure as well as bradycardic heart rates.  Decrease metoprolol succinate to 12.5 mg once daily.  Recommend gentle p.o. hydration.  Positional change slowly.  He is having no difficulty advancing his daily activities without issues.  He continues to exercise at the gym several days per week.  5. HLD: Recent lipid panel from 11/07/2017 showed an LDL of 92.  Goal LDL less  than 70.  Remains on Lipitor 80 mg daily as well as Zetia 10 mg daily.  Plan to recheck fasting lipid and liver function at follow-up.  If patient remains above goal LDL at that time would recommend referral to the [facility] lipid clinic for possible PCSK9 inhibitor.  Disposition: F/u with myself or Dr. Fletcher Anon in 3 months.  Current medicines are reviewed at length with the patient today.  The patient did not have any concerns regarding medicines.  Signed, [name], PA-C 11/26/2017 3:57 PM     [address], [place] [address] [phone]

## 2017-11-26 NOTE — Patient Instructions (Signed)
Medication Instructions:  Your physician has recommended you make the following change in your medication:  1- TAKE Metoprolol 0.5 tablet (12.5 mg) by mouth once a day.   Labwork: Your physician recommends that you return for lab work in: [place] ([facility]).   Testing/Procedures: none  Follow-Up: Your physician recommends that you schedule a follow-up appointment in: [place].   If you need a refill on your cardiac medications before your next appointment, please call your pharmacy.

## 2017-11-27 LAB — BASIC METABOLIC PANEL
BUN/Creatinine Ratio: 8 — ABNORMAL LOW (ref 10–24)
BUN: 9 mg/dL (ref 8–27)
CALCIUM: 9.6 mg/dL (ref 8.6–10.2)
CHLORIDE: 102 mmol/L (ref 96–106)
CO2: 22 mmol/L (ref 20–29)
Creatinine, Ser: 1.09 mg/dL (ref 0.76–1.27)
GFR calc Af Amer: 82 mL/min/{1.73_m2} (ref >59)
GFR, EST NON AFRICAN AMERICAN: 71 mL/min/{1.73_m2} (ref >59)
Glucose: 92 mg/dL (ref 65–99)
POTASSIUM: 4 mmol/L (ref 3.5–5.2)
Sodium: 140 mmol/L (ref 134–144)

## 2017-12-03 ENCOUNTER — Encounter

## 2017-12-03 ENCOUNTER — Ambulatory Visit: Payer: 59 | Admitting: Physician Assistant

## 2017-12-06 ENCOUNTER — Telehealth: Payer: Self-pay

## 2017-12-06 ENCOUNTER — Other Ambulatory Visit: Payer: Self-pay

## 2017-12-06 MED ORDER — ATORVASTATIN CALCIUM 80 MG PO TABS: 80.0000 mg | ORAL_TABLET | Freq: Every day | ORAL | 1 refills | Status: DC

## 2017-12-06 MED ORDER — CLOPIDOGREL BISULFATE 75 MG PO TABS: ORAL_TABLET | ORAL | 1 refills | Status: DC

## 2017-12-06 MED ORDER — POTASSIUM CHLORIDE CRYS ER 10 MEQ PO TBCR
10.0000 meq | EXTENDED_RELEASE_TABLET | Freq: Every day | ORAL | 1 refills | Status: DC
Start: 2017-12-06 — End: 2017-12-10

## 2017-12-06 MED ORDER — FUROSEMIDE 20 MG PO TABS: 20.0000 mg | ORAL_TABLET | Freq: Every day | ORAL | 1 refills | Status: DC

## 2017-12-06 MED ORDER — TAMSULOSIN HCL 0.4 MG PO CAPS: 0.4000 mg | ORAL_CAPSULE | Freq: Every day | ORAL | 1 refills | Status: DC

## 2017-12-06 MED ORDER — ISOSORBIDE MONONITRATE ER 30 MG PO TB24: 30.0000 mg | ORAL_TABLET | Freq: Every day | ORAL | 1 refills | Status: DC

## 2017-12-06 MED ORDER — LOSARTAN POTASSIUM 25 MG PO TABS: 25.0000 mg | ORAL_TABLET | Freq: Every day | ORAL | 1 refills | Status: DC

## 2017-12-06 NOTE — Telephone Encounter (Signed)
Received fax from [facility] mail order requesting meds to be sent in to them.  Called and spoke with patient and confirmed he is switching his medications to [facility] for now on.   Sent meds that were requested in the fax into [facility] for patient and added [facility] as patients pharmacy.

## 2017-12-10 ENCOUNTER — Other Ambulatory Visit: Payer: Self-pay | Admitting: Internal Medicine

## 2017-12-10 MED ORDER — ISOSORBIDE MONONITRATE ER 30 MG PO TB24: 30.0000 mg | ORAL_TABLET | Freq: Every day | ORAL | 1 refills | Status: DC

## 2017-12-10 MED ORDER — LOSARTAN POTASSIUM 25 MG PO TABS: 25.0000 mg | ORAL_TABLET | Freq: Every day | ORAL | 1 refills | Status: DC

## 2017-12-10 MED ORDER — POTASSIUM CHLORIDE CRYS ER 10 MEQ PO TBCR: 10.0000 meq | EXTENDED_RELEASE_TABLET | Freq: Every day | ORAL | 1 refills | Status: DC

## 2017-12-10 MED ORDER — ATORVASTATIN CALCIUM 80 MG PO TABS: 80.0000 mg | ORAL_TABLET | Freq: Every day | ORAL | 1 refills | Status: DC

## 2017-12-10 MED ORDER — FUROSEMIDE 20 MG PO TABS: 20.0000 mg | ORAL_TABLET | Freq: Every day | ORAL | 1 refills | Status: DC

## 2017-12-10 MED ORDER — METOPROLOL SUCCINATE ER 25 MG PO TB24: 12.5000 mg | ORAL_TABLET | Freq: Every day | ORAL | 3 refills | Status: DC

## 2017-12-10 MED ORDER — CLOPIDOGREL BISULFATE 75 MG PO TABS: ORAL_TABLET | ORAL | 1 refills | Status: DC

## 2017-12-10 MED ORDER — TAMSULOSIN HCL 0.4 MG PO CAPS: 0.4000 mg | ORAL_CAPSULE | Freq: Every day | ORAL | 1 refills | Status: DC

## 2017-12-18 ENCOUNTER — Other Ambulatory Visit: Payer: Self-pay | Admitting: Physician Assistant

## 2017-12-23 ENCOUNTER — Other Ambulatory Visit: Payer: Self-pay | Admitting: Physician Assistant

## 2017-12-27 ENCOUNTER — Ambulatory Visit: Payer: 59 | Admitting: Cardiovascular Disease

## 2018-01-15 ENCOUNTER — Ambulatory Visit: Payer: 59 | Admitting: Family Medicine

## 2018-01-17 ENCOUNTER — Ambulatory Visit: Payer: 59 | Admitting: Internal Medicine

## 2018-01-20 DIAGNOSIS — L301 Dyshidrosis [pompholyx]: Secondary | ICD-10-CM | POA: Diagnosis not present

## 2018-02-27 ENCOUNTER — Ambulatory Visit: Payer: 59 | Admitting: Cardiovascular Disease

## 2018-03-17 NOTE — Progress Notes (Signed)
Pt scheduled to be seen today for an Initial Medicare Wellness exam. However, pt recently enrolled in Medicare on 11/26/17. He is eligible for Welcome to Medicare until 11/27/18. Will be unable to be seen by [facility] until 11/28/18. All has been explained to the patient. Verbalized acceptance and understanding. [facility] appt has been canceled. Pt has been advised to keep CPE as scheduled on 03/28/18 with Dr. Army Melia.

## 2018-03-28 ENCOUNTER — Ambulatory Visit (INDEPENDENT_AMBULATORY_CARE_PROVIDER_SITE_OTHER): Payer: Medicare HMO | Admitting: Internal Medicine

## 2018-03-28 ENCOUNTER — Encounter: Payer: Self-pay | Admitting: Internal Medicine

## 2018-03-28 VITALS — BP 128/78 | HR 60 | Ht 67.0 in | Wt 181.0 lb

## 2018-03-28 DIAGNOSIS — H6123 Impacted cerumen, bilateral: Secondary | ICD-10-CM | POA: Diagnosis not present

## 2018-03-28 DIAGNOSIS — Z1159 Encounter for screening for other viral diseases: Secondary | ICD-10-CM | POA: Diagnosis not present

## 2018-03-28 DIAGNOSIS — Z23 Encounter for immunization: Secondary | ICD-10-CM | POA: Diagnosis not present

## 2018-03-28 DIAGNOSIS — N529 Male erectile dysfunction, unspecified: Secondary | ICD-10-CM

## 2018-03-28 DIAGNOSIS — I252 Old myocardial infarction: Secondary | ICD-10-CM | POA: Diagnosis not present

## 2018-03-28 DIAGNOSIS — Z125 Encounter for screening for malignant neoplasm of prostate: Secondary | ICD-10-CM | POA: Diagnosis not present

## 2018-03-28 DIAGNOSIS — Z0000 Encounter for general adult medical examination without abnormal findings: Secondary | ICD-10-CM | POA: Diagnosis not present

## 2018-03-28 DIAGNOSIS — I10 Essential (primary) hypertension: Secondary | ICD-10-CM | POA: Diagnosis not present

## 2018-03-28 DIAGNOSIS — E785 Hyperlipidemia, unspecified: Secondary | ICD-10-CM | POA: Diagnosis not present

## 2018-03-28 LAB — POCT URINALYSIS DIPSTICK
BILIRUBIN UA: NEGATIVE
Glucose, UA: NEGATIVE
Ketones, UA: NEGATIVE
Leukocytes, UA: NEGATIVE
Nitrite, UA: NEGATIVE
PH UA: 6 (ref 5.0–8.0)
Protein, UA: NEGATIVE
RBC UA: NEGATIVE
SPEC GRAV UA: 1.015 (ref 1.010–1.025)
UROBILINOGEN UA: 0.2 U/dL

## 2018-03-28 NOTE — Patient Instructions (Signed)
Pneumococcal Conjugate Vaccine (PCV13) What You Need to Know 1. Why get vaccinated? Vaccination can protect both children and adults from pneumococcal disease. Pneumococcal disease is caused by bacteria that can spread from person to person through close contact. It can cause ear infections, and it can also lead to more serious infections of the:  Lungs (pneumonia),  Blood (bacteremia), and  Covering of the brain and spinal cord (meningitis).  Pneumococcal pneumonia is most common among adults. Pneumococcal meningitis can cause deafness and brain damage, and it kills about 1 child in 10 who get it. Anyone can get pneumococcal disease, but children under 2 years of age and adults 65 years and older, people with certain medical conditions, and cigarette smokers are at the highest risk. Before there was a vaccine, the [place] saw:  more than 700 cases of meningitis,  about 13,000 blood infections,  about 5 million ear infections, and  about 200 deaths  in children under 5 each year from pneumococcal disease. Since vaccine became available, severe pneumococcal disease in these children has fallen by 88%. About 18,000 older adults die of pneumococcal disease each year in the [place]. Treatment of pneumococcal infections with penicillin and other drugs is not as effective as it used to be, because some strains of the disease have become resistant to these drugs. This makes prevention of the disease, through vaccination, even more important. 2. PCV13 vaccine Pneumococcal conjugate vaccine (called PCV13) protects against 13 types of pneumococcal bacteria. PCV13 is routinely given to children at 2, 4, 6, and 12-15 months of age. It is also recommended for children and adults 2 to 64 years of age with certain health conditions, and for all adults 65 years of age and older. Your doctor can give you details. 3. Some people should not get this vaccine Anyone who has ever had a  life-threatening allergic reaction to a dose of this vaccine, to an earlier pneumococcal vaccine called PCV7, or to any vaccine containing diphtheria toxoid (for example, DTaP), should not get PCV13. Anyone with a severe allergy to any component of PCV13 should not get the vaccine. Tell your doctor if the person being vaccinated has any severe allergies. If the person scheduled for vaccination is not feeling well, your healthcare provider might decide to reschedule the shot on another day. 4. Risks of a vaccine reaction With any medicine, including vaccines, there is a chance of reactions. These are usually mild and go away on their own, but serious reactions are also possible. Problems reported following PCV13 varied by age and dose in the series. The most common problems reported among children were:  About half became drowsy after the shot, had a temporary loss of appetite, or had redness or tenderness where the shot was given.  About 1 out of 3 had swelling where the shot was given.  About 1 out of 3 had a mild fever, and about 1 in 20 had a fever over 102.2F.  Up to about 8 out of 10 became fussy or irritable.  Adults have reported pain, redness, and swelling where the shot was given; also mild fever, fatigue, headache, chills, or muscle pain. Young children who get PCV13 along with inactivated flu vaccine at the same time may be at increased risk for seizures caused by fever. Ask your doctor for more information. Problems that could happen after any vaccine:  People sometimes faint after a medical procedure, including vaccination. Sitting or lying down for about 15 minutes can help prevent   fainting, and injuries caused by a fall. Tell your doctor if you feel dizzy, or have vision changes or ringing in the ears.  Some older children and adults get severe pain in the shoulder and have difficulty moving the arm where a shot was given. This happens very rarely.  Any medication can cause a  severe allergic reaction. Such reactions from a vaccine are very rare, estimated at about 1 in a million doses, and would happen within a few minutes to a few hours after the vaccination. As with any medicine, there is a very small chance of a vaccine causing a serious injury or death. The safety of vaccines is always being monitored. For more information, visit: www.cdc.gov/vaccinesafety/ 5. What if there is a serious reaction? What should I look for? Look for anything that concerns you, such as signs of a severe allergic reaction, very high fever, or unusual behavior. Signs of a severe allergic reaction can include hives, swelling of the face and throat, difficulty breathing, a fast heartbeat, dizziness, and weakness-usually within a few minutes to a few hours after the vaccination. What should I do?  If you think it is a severe allergic reaction or other emergency that can't wait, call 9-1-1 or get the person to the nearest hospital. Otherwise, call your doctor.  Reactions should be reported to the Vaccine Adverse Event Reporting System (VAERS). Your doctor should file this report, or you can do it yourself through the VAERS web site at www.vaers.hhs.gov, or by calling 1-800-822-7967. ? VAERS does not give medical advice. 6. The National Vaccine Injury Compensation Program The National Vaccine Injury Compensation Program (VICP) is a federal program that was created to compensate people who may have been injured by certain vaccines. Persons who believe they may have been injured by a vaccine can learn about the program and about filing a claim by calling 1-800-338-2382 or visiting the VICP website at www.hrsa.gov/vaccinecompensation. There is a time limit to file a claim for compensation. 7. How can I learn more?  Ask your healthcare provider. He or she can give you the vaccine package insert or suggest other sources of information.  Call your local or state health department.  Contact the  Centers for Disease Control and Prevention (CDC): ? Call 1-800-232-4636 (1-800-CDC-INFO) or ? Visit CDC's website at www.cdc.gov/vaccines Vaccine Information Statement, PCV13 Vaccine (05/27/2014) This information is not intended to replace advice given to you by your health care provider. Make sure you discuss any questions you have with your health care provider. Document Released: 05/06/2006 Document Revised: 03/29/2016 Document Reviewed: 03/29/2016 Elsevier Interactive Patient Education  2017 Elsevier Inc.  

## 2018-03-28 NOTE — Progress Notes (Signed)
Date:  03/28/2018   Name:  Todd Weaver   DOB:  04-14-1951   MRN:  [id number]   Chief Complaint: Annual Exam Todd Weaver is a 67 y.o. male who presents today for his Complete Annual Exam. He feels well. He reports exercising at the gym and walking. He reports he is sleeping well. He is due for Prevnar-13 and Influenza vaccines.  Hypertension  This is a chronic problem. The problem is unchanged. The problem is controlled. Pertinent negatives include no chest pain, headaches, palpitations or shortness of breath.  Benign Prostatic Hypertrophy  This is a chronic problem. Irritative symptoms do not include frequency. Obstructive symptoms do not include incomplete emptying, a slower stream or straining. nocturia. Pertinent negatives include no chills or dysuria.  Hyperlipidemia  This is a chronic problem. Pertinent negatives include no chest pain, myalgias or shortness of breath. Current antihyperlipidemic treatment includes statins. The current treatment provides significant improvement of lipids.  Erectile Dysfunction  This is a new problem. The current episode started more than 1 month ago. The problem is unchanged. The nature of his difficulty is achieving erection and maintaining erection. He reports his erection duration to be less than 1 minute. Irritative symptoms do not include frequency. Obstructive symptoms do not include incomplete emptying, a slower stream or straining. nocturia. Pertinent negatives include no chills or dysuria. The symptoms are aggravated by medications. Past treatments include sildenafil. The treatment provided significant relief.   ED - ongoing since his MI and starting all the medications.  He has tried Viagra which worked well but was too expensive.  He takes IMDUR daily but has not taken any NTG.   Review of Systems  Constitutional: Negative for appetite change, chills, diaphoresis, fatigue and unexpected weight change.  HENT: Negative for hearing loss,  tinnitus, trouble swallowing and voice change.   Eyes: Negative for visual disturbance.  Respiratory: Negative for choking, shortness of breath and wheezing.   Cardiovascular: Negative for chest pain, palpitations and leg swelling.  Gastrointestinal: Negative for abdominal pain, blood in stool, constipation and diarrhea.  Genitourinary: Negative for difficulty urinating, dysuria, frequency and incomplete emptying.  Musculoskeletal: Negative for arthralgias, back pain and myalgias.  Skin: Negative for color change and rash.  Neurological: Negative for dizziness, syncope and headaches.  Hematological: Negative for adenopathy.  Psychiatric/Behavioral: Negative for dysphoric mood and sleep disturbance.    Patient Active Problem List   Diagnosis Date Noted  . Unstable angina ([place])   . Daytime sleepiness 09/23/2017  . Contraindication to percutaneous coronary intervention (PCI) 09/09/2017  . Coronary artery disease 08/15/2017  . Shortness of breath 07/22/2017  . STEMI (ST elevation myocardial infarction) ([place]) 07/18/2017  . Hypertension 06/19/2017  . Hyperlipidemia 06/19/2017  . History of CVA (cerebrovascular accident) 06/19/2017  . Overweight (BMI 25.0-29.9) 06/19/2017  . BPH (benign prostatic hyperplasia) 06/19/2017  . ED (erectile dysfunction) 06/19/2017  . MCI (mild cognitive impairment) 06/19/2017  . FH: Alzheimer's disease 06/19/2017  . Lumbar spondylosis with myelopathy 11/08/2016  . Hand dermatitis 07/26/2014    Allergies  Allergen Reactions  . Codeine Hives  . Shellfish Allergy Hives and Swelling  . Contrast Media [Iodinated Diagnostic Agents] Itching and Other (See Comments)    Whelps on tongue    Past Surgical History:  Procedure Laterality Date  . ANAL FISTULECTOMY N/A 12/21/2014   Procedure: FISTULECTOMY ANAL;  Surgeon: Christene Lye, MD;  Location: [facility] OR[facility];  Service: General;  Laterality: N/A;  . APPENDECTOMY  1975  .  CORONARY STENT INTERVENTION N/A  09/09/2017   Procedure: CORONARY STENT INTERVENTION;  Surgeon: Wellington Hampshire, MD;  Location: [place] [facility];  Service: Cardiovascular;  Laterality: N/A;  . CORONARY/GRAFT ACUTE MI REVASCULARIZATION N/A 07/18/2017   Procedure: Coronary/Graft Acute MI Revascularization;  Surgeon: Wellington Hampshire, MD;  Location: [place] [facility];  Service: Cardiovascular;  Laterality: N/A;  . HEMORRHOID SURGERY N/A 12/21/2014   Procedure: HEMORRHOIDECTOMY;  Surgeon: Christene Lye, MD;  Location: [facility];  Service: General;  Laterality: N/A;  . INTRAVASCULAR ULTRASOUND/IVUS N/A 09/09/2017   Procedure: Intravascular Ultrasound/IVUS;  Surgeon: Wellington Hampshire, MD;  Location: [place] [facility];  Service: Cardiovascular;  Laterality: N/A;  . KNEE SURGERY Right age 66  . LEFT HEART CATH AND CORONARY ANGIOGRAPHY N/A 07/18/2017   Procedure: LEFT HEART CATH AND CORONARY ANGIOGRAPHY;  Surgeon: Wellington Hampshire, MD;  Location: [place] [facility];  Service: Cardiovascular;  Laterality: N/A;  . LEFT HEART CATH AND CORONARY ANGIOGRAPHY Left 08/19/2017   Procedure: LEFT HEART CATH AND CORONARY ANGIOGRAPHY;  Surgeon: Wellington Hampshire, MD;  Location: [place] [facility];  Service: Cardiovascular;  Laterality: Left;  . LEFT HEART CATH AND CORONARY ANGIOGRAPHY N/A 11/07/2017   Procedure: LEFT HEART CATH AND CORONARY ANGIOGRAPHY;  Surgeon: Wellington Hampshire, MD;  Location: [place] [facility];  Service: Cardiovascular;  Laterality: N/A;  . ROTATOR CUFF REPAIR Right 2013    Social History   Tobacco Use  . Smoking status: Former Smoker    Types: Cigarettes    Last attempt to quit: 07/24/2007    Years since quitting: 10.6  . Smokeless tobacco: Never Used  . Tobacco comment: smoking cessation materials not required  Substance Use Topics  . Alcohol use: Yes    Alcohol/week: 0.0 standard drinks    Comment: OCC  . Drug use: No     Medication list has been reviewed and updated.  Current Meds    Medication Sig  . aspirin EC 81 MG tablet Take 81 mg by mouth daily.   Marland Kitchen atorvastatin (LIPITOR) 80 MG tablet Take 1 tablet (80 mg total) by mouth daily at 6 PM.  . clopidogrel (PLAVIX) 75 MG tablet 1 tablet by mouth once a day thereafter.  . furosemide (LASIX) 20 MG tablet Take 1 tablet (20 mg total) by mouth daily.  . isosorbide mononitrate (IMDUR) 30 MG 24 hr tablet Take 1 tablet (30 mg total) by mouth daily.  Marland Kitchen losartan (COZAAR) 25 MG tablet Take 1 tablet (25 mg total) by mouth daily.  . nitroGLYCERIN (NITROSTAT) 0.4 MG SL tablet Place 1 tablet (0.4 mg total) under the tongue every 5 (five) minutes as needed for chest pain.  . potassium chloride (K-DUR,KLOR-CON) 10 MEQ tablet Take 1 tablet (10 mEq total) by mouth daily.  . tamsulosin (FLOMAX) 0.4 MG CAPS capsule Take 1 capsule (0.4 mg total) by mouth daily.    PHQ 2/9 Scores 03/28/2018 10/23/2017 08/01/2017 06/19/2017  PHQ - 2 Score 0 1 0 0  PHQ- 9 Score - 5 2 -    Physical Exam  Constitutional: He is oriented to person, place, and time. He appears well-developed and well-nourished.  HENT:  Head: Normocephalic.  Right Ear: Tympanic membrane, external ear and ear canal normal.  Left Ear: Tympanic membrane, external ear and ear canal normal.  Nose: Nose normal.  Mouth/Throat: Uvula is midline and oropharynx is clear and moist.  Cerumen impaction bilaterally Left canal debrided of cerumen with curette - normal TM  Right canal unable to be debrided due to cerumen too deep for safe extraction  Eyes: Pupils are equal, round, and reactive to light. Conjunctivae and EOM are normal.  Neck: Normal range of motion. Neck supple. Carotid bruit is not present. No thyromegaly present.  Cardiovascular: Normal rate, regular rhythm, normal heart sounds and intact distal pulses.  Pulmonary/Chest: Effort normal and breath sounds normal. He has no wheezes. Right breast exhibits no mass. Left breast exhibits no mass.  Abdominal: Soft. Normal appearance and  bowel sounds are normal. There is no hepatosplenomegaly. There is no tenderness.  Musculoskeletal: Normal range of motion.  Lymphadenopathy:    He has no cervical adenopathy.  Neurological: He is alert and oriented to person, place, and time. He has normal reflexes.  Skin: Skin is warm, dry and intact.  Psychiatric: He has a normal mood and affect. His speech is normal and behavior is normal. Judgment and thought content normal.  Nursing note and vitals reviewed.   BP 128/78 (BP Location: Right Arm, Patient Position: Sitting, Cuff Size: Normal)   Pulse 60   Ht 5\' 7"  (1.702 m)   Wt 181 lb (82.1 kg)   SpO2 97%   BMI 28.35 kg/m   Assessment and Plan: 1. Annual physical exam Normal exam Continue healthy diet and exercise - POCT urinalysis dipstick  2. Need for pneumococcal vaccination - Pneumococcal conjugate vaccine 13-valent IM  3. Need for influenza vaccination - Flu Vaccine QUAD 36+ mos IM  4. History of ST elevation myocardial infarction (STEMI) Stable with no current sx and moderate exercise Follow up with Cardiology  5. Essential hypertension controlled - CBC with Differential/Platelet - Comprehensive metabolic panel  6. Erectile dysfunction, unspecified erectile dysfunction type rec discuss stopping IMDUR so he can take Viagra with cardiology at next appt  7. Hyperlipidemia, unspecified hyperlipidemia type On statin therapy - Lipid panel  8. Need for hepatitis C screening test - Hepatitis C antibody  9. Prostate cancer screening DRE deferred - PSA  10. Bilateral impacted cerumen Left canal cleared May need to see ENT if right side is bothersome  No orders of the defined types were placed in this encounter.   Partially dictated using Editor, commissioning. Any errors are unintentional.  Halina Maidens, MD [facility]  03/28/2018  There are no diagnoses linked to this encounter.

## 2018-03-29 LAB — CBC WITH DIFFERENTIAL/PLATELET
BASOS: 1 %
Basophils Absolute: 0.1 10*3/uL (ref 0.0–0.2)
EOS (ABSOLUTE): 0.3 10*3/uL (ref 0.0–0.4)
EOS: 5 %
HEMATOCRIT: 45.6 % (ref 37.5–51.0)
Hemoglobin: 14.7 g/dL (ref 13.0–17.7)
Immature Grans (Abs): 0 10*3/uL (ref 0.0–0.1)
Immature Granulocytes: 0 %
LYMPHS ABS: 3.3 10*3/uL — AB (ref 0.7–3.1)
Lymphs: 45 %
MCH: 27.2 pg (ref 26.6–33.0)
MCHC: 32.2 g/dL (ref 31.5–35.7)
MCV: 84 fL (ref 79–97)
MONOS ABS: 0.6 10*3/uL (ref 0.1–0.9)
Monocytes: 9 %
Neutrophils Absolute: 2.8 10*3/uL (ref 1.4–7.0)
Neutrophils: 40 %
Platelets: 297 10*3/uL (ref 150–450)
RBC: 5.4 x10E6/uL (ref 4.14–5.80)
RDW: 15.4 % (ref 12.3–15.4)
WBC: 7 10*3/uL (ref 3.4–10.8)

## 2018-03-29 LAB — COMPREHENSIVE METABOLIC PANEL
A/G RATIO: 1.7 (ref 1.2–2.2)
ALK PHOS: 70 IU/L (ref 39–117)
ALT: 30 IU/L (ref 0–44)
AST: 27 IU/L (ref 0–40)
Albumin: 5 g/dL — ABNORMAL HIGH (ref 3.6–4.8)
BUN/Creatinine Ratio: 13 (ref 10–24)
BUN: 14 mg/dL (ref 8–27)
Bilirubin Total: 1.3 mg/dL — ABNORMAL HIGH (ref 0.0–1.2)
CHLORIDE: 104 mmol/L (ref 96–106)
CO2: 21 mmol/L (ref 20–29)
Calcium: 9.3 mg/dL (ref 8.6–10.2)
Creatinine, Ser: 1.06 mg/dL (ref 0.76–1.27)
GFR calc Af Amer: 85 mL/min/{1.73_m2} (ref >59)
GFR calc non Af Amer: 73 mL/min/{1.73_m2} (ref >59)
Globulin, Total: 2.9 g/dL (ref 1.5–4.5)
Glucose: 104 mg/dL — ABNORMAL HIGH (ref 65–99)
POTASSIUM: 4.3 mmol/L (ref 3.5–5.2)
SODIUM: 141 mmol/L (ref 134–144)
Total Protein: 7.9 g/dL (ref 6.0–8.5)

## 2018-03-29 LAB — LIPID PANEL
CHOLESTEROL TOTAL: 177 mg/dL (ref 100–199)
Chol/HDL Ratio: 3.1 ratio (ref 0.0–5.0)
HDL: 57 mg/dL (ref >39)
LDL Calculated: 106 mg/dL — ABNORMAL HIGH (ref 0–99)
TRIGLYCERIDES: 69 mg/dL (ref 0–149)
VLDL Cholesterol Cal: 14 mg/dL (ref 5–40)

## 2018-03-29 LAB — HEPATITIS C ANTIBODY: Hep C Virus Ab: <0.1 s/co ratio (ref 0.0–0.9)

## 2018-03-29 LAB — PSA: Prostate Specific Ag, Serum: 1.1 ng/mL (ref 0.0–4.0)

## 2018-04-29 ENCOUNTER — Ambulatory Visit: Payer: Medicare HMO | Admitting: Cardiovascular Disease

## 2018-04-29 VITALS — BP 148/82 | HR 54 | Ht 67.0 in | Wt 182.5 lb

## 2018-04-29 DIAGNOSIS — I5022 Chronic systolic (congestive) heart failure: Secondary | ICD-10-CM

## 2018-04-29 DIAGNOSIS — E785 Hyperlipidemia, unspecified: Secondary | ICD-10-CM | POA: Diagnosis not present

## 2018-04-29 DIAGNOSIS — I10 Essential (primary) hypertension: Secondary | ICD-10-CM | POA: Diagnosis not present

## 2018-04-29 DIAGNOSIS — I2510 Atherosclerotic heart disease of native coronary artery without angina pectoris: Secondary | ICD-10-CM

## 2018-04-29 MED ORDER — LOSARTAN POTASSIUM 50 MG PO TABS: 50.0000 mg | ORAL_TABLET | Freq: Every day | ORAL | 3 refills | Status: DC

## 2018-04-29 MED ORDER — EZETIMIBE 10 MG PO TABS: 10.0000 mg | ORAL_TABLET | Freq: Every day | ORAL | 3 refills | Status: DC

## 2018-04-29 NOTE — Patient Instructions (Addendum)
Medication Instructions:  Your physician has recommended you make the following change in your medication:  1.) increase losartan to 50 mg once a day 2.) start zetia 10 mg once a day 3.) stop metoprolol (Toprol)  If you need a refill on your cardiac medications before your next appointment, please call your pharmacy.   Lab work: IN 6 WEEKS (LIPIDS/LIVER)  If you have labs (blood work) drawn today and your tests are completely normal, you will receive your results only by: Marland Kitchen MyChart Message (if you have MyChart) OR . A paper copy in the mail If you have any lab test that is abnormal or we need to change your treatment, we will call you to review the results.  Testing/Procedures: NONE  Follow-Up: At [facility], you and your health needs are our priority.  As part of our continuing mission to provide you with exceptional heart care, we have created designated Provider Care Teams.  These Care Teams include your primary Cardiologist (physician) and Advanced Practice Providers (APPs -  Physician Assistants and Nurse Practitioners) who all work together to provide you with the care you need, when you need it. You will need a follow up appointment in 6 months.  Please call our office 2 months in advance to schedule this appointment.  You may see Kathlyn Sacramento, MD or one of the following Advanced Practice Providers on your designated Care Team:   Murray Hodgkins, NP [name], PA-C . Marrianne Mood, PA-C  Any Other Special Instructions Will Be Listed Below (If Applicable). NONE

## 2018-04-29 NOTE — Progress Notes (Signed)
Cardiology Office Note   Date:  04/29/2018   ID:  Todd Weaver, DOB 1951/02/05, MRN [id number]  PCP:  Glean Hess, MD  Cardiologist:   Kathlyn Sacramento, MD   Chief Complaint  Patient presents with  . other    3 mo follow up. Medications reviewed verbally.       History of Present Illness: Todd Weaver is a 67 y.o. male who presents for a follow-up visit regarding coronary artery disease.  He had inferior ST elevation myocardial infarction complicated by ventricular fibrillation arrest in December 2018 which was treated successfully with PCI and 2 overlapped drug-eluting stent placement to the right coronary artery.  He was also found to have significant ostial LAD stenosis, borderline significant proximal LAD stenosis and moderate distal LAD stenosis.  Ejection fraction was mildly reduced.  He had atrial fibrillation after the procedure that was treated successfully with amiodarone. He has other chronic medical conditions that include previous stroke, hyperlipidemia, previous tobacco use and BPH. I proceeded with IVIS guided drug-eluting stent placement to the proximal and ostial LAD with one long stent and February 2019. The patient had recurrent chest pain in April.  I repeated cardiac catheterization which showed widely patent LAD and RCA stent with no significant restenosis.  The ramus branch was pinched by the LAD stent with 70% ostial stenosis.  EF was 50 to 55%.  The patient had dyspnea with Brilinta that improved after switching to Plavix.  He has been doing well with no recent chest pain, shortness of breath or palpitations. He complains of erectile dysfunction since his myocardial infarction.   Past Medical History:  Diagnosis Date  . Acute ST elevation myocardial infarction (STEMI) of inferior wall ([place]) 07/18/2017  . CAD (coronary artery disease)    a. 03/2016 MV: EF 55-65%, low risk study; b. 06/2017 Inf STEMI: LM 85d, LAD 60p, 96m, LCX 40p, RCA 99ost (3.25x18  Xience [place] DES/3.0x38 [place] DES), RPAV 60; c. 07/2017 Cath: LM 85d, LAD 85ost, 60p, 31m, RI 50ost, LCX 50p, RCA patent stent, RPAD/RPAV min irregs; c. 08/2017 PCI dLM/LAD (3.0x38 Xience [place]).  . Contrast media allergy   . GERD (gastroesophageal reflux disease)   . Hemorrhoid   . Hyperlipidemia   . Hypertension   . Ischemic cardiomyopathy    a. 06/2017 Echo: EF 45-50%, mod inf/infsept HK. Mildly dil LA.  Marland Kitchen PAF (paroxysmal atrial fibrillation) ([place])    a. Brief episode during December hospitalization-->converted on IV amio-->not on [place].  Marland Kitchen Stroke [facility]) 2011   "2 MINI STROKES"  . Unstable angina [facility])     Past Surgical History:  Procedure Laterality Date  . ANAL FISTULECTOMY N/A 12/21/2014   Procedure: FISTULECTOMY ANAL;  Surgeon: Christene Lye, MD;  Location: [facility] ORS;  Service: General;  Laterality: N/A;  . APPENDECTOMY  1975  . CORONARY STENT INTERVENTION N/A 09/09/2017   Procedure: CORONARY STENT INTERVENTION;  Surgeon: Wellington Hampshire, MD;  Location: [place] CV LAB;  Service: Cardiovascular;  Laterality: N/A;  . CORONARY/GRAFT ACUTE MI REVASCULARIZATION N/A 07/18/2017   Procedure: Coronary/Graft Acute MI Revascularization;  Surgeon: Wellington Hampshire, MD;  Location: [place] CV LAB;  Service: Cardiovascular;  Laterality: N/A;  . HEMORRHOID SURGERY N/A 12/21/2014   Procedure: HEMORRHOIDECTOMY;  Surgeon: Christene Lye, MD;  Location: [facility] ORS;  Service: General;  Laterality: N/A;  . INTRAVASCULAR ULTRASOUND/IVUS N/A 09/09/2017   Procedure: Intravascular Ultrasound/IVUS;  Surgeon: Wellington Hampshire, MD;  Location: [place] CV LAB;  Service: Cardiovascular;  Laterality: N/A;  . KNEE SURGERY Right age 21  . LEFT HEART CATH AND CORONARY ANGIOGRAPHY N/A 07/18/2017   Procedure: LEFT HEART CATH AND CORONARY ANGIOGRAPHY;  Surgeon: Wellington Hampshire, MD;  Location: [place] CV LAB;  Service: Cardiovascular;  Laterality: N/A;  . LEFT HEART CATH AND  CORONARY ANGIOGRAPHY Left 08/19/2017   Procedure: LEFT HEART CATH AND CORONARY ANGIOGRAPHY;  Surgeon: Wellington Hampshire, MD;  Location: [place] CV LAB;  Service: Cardiovascular;  Laterality: Left;  . LEFT HEART CATH AND CORONARY ANGIOGRAPHY N/A 11/07/2017   Procedure: LEFT HEART CATH AND CORONARY ANGIOGRAPHY;  Surgeon: Wellington Hampshire, MD;  Location: [place] CV LAB;  Service: Cardiovascular;  Laterality: N/A;  . ROTATOR CUFF REPAIR Right 2013     Current Outpatient Medications  Medication Sig Dispense Refill  . aspirin EC 81 MG tablet Take 81 mg by mouth daily.     Marland Kitchen atorvastatin (LIPITOR) 80 MG tablet Take 1 tablet (80 mg total) by mouth daily at 6 PM. 90 tablet 1  . clopidogrel (PLAVIX) 75 MG tablet 1 tablet by mouth once a day thereafter. 90 tablet 1  . furosemide (LASIX) 20 MG tablet Take 1 tablet (20 mg total) by mouth daily. 90 tablet 1  . isosorbide mononitrate (IMDUR) 30 MG 24 hr tablet Take 1 tablet (30 mg total) by mouth daily. 90 tablet 1  . losartan (COZAAR) 25 MG tablet Take 1 tablet (25 mg total) by mouth daily. 90 tablet 1  . nitroGLYCERIN (NITROSTAT) 0.4 MG SL tablet Place 1 tablet (0.4 mg total) under the tongue every 5 (five) minutes as needed for chest pain. 30 tablet 0  . potassium chloride (K-DUR,KLOR-CON) 10 MEQ tablet Take 1 tablet (10 mEq total) by mouth daily. 90 tablet 1  . tamsulosin (FLOMAX) 0.4 MG CAPS capsule Take 1 capsule (0.4 mg total) by mouth daily. 90 capsule 1   No current facility-administered medications for this visit.     Allergies:   Codeine; Shellfish allergy; and Contrast media [iodinated diagnostic agents]    Social History:  The patient  reports that he quit smoking about 10 years ago. His smoking use included cigarettes. He has never used smokeless tobacco. He reports that he drinks alcohol. He reports that he does not use drugs.   Family History:  The patient's family history includes Alzheimer's disease in his father and mother;  Hypertension in his father and mother.    ROS:  Please see the history of present illness.   Otherwise, review of systems are positive for none.   All other systems are reviewed and negative.    PHYSICAL EXAM: VS:  BP (!) 148/82 (BP Location: Left Arm, Patient Position: Sitting, Cuff Size: Normal)   Pulse (!) 54   Ht 5\' 7"  (1.702 m)   Wt 182 lb 8 oz (82.8 kg)   BMI 28.58 kg/m   , BMI Body mass index is 28.58 kg/m. GEN: Well nourished, well developed, in no acute distress  HEENT: normal  Neck: no JVD, carotid bruits, or masses Cardiac: RRR; no murmurs, rubs, or gallops,no edema  Respiratory:  clear to auscultation bilaterally, normal work of breathing GI: soft, nontender, nondistended, + BS MS: no deformity or atrophy  Skin: warm and dry, no rash Neuro:  Strength and sensation are intact Psych: euthymic mood, full affect   EKG:  EKG is ordered today. The ekg ordered today demonstrates sinus bradycardia with nonspecific T wave changes.   Recent Labs:  07/19/2017: TSH 1.232 07/20/2017: Magnesium 1.9 03/28/2018: ALT 30; BUN 14; Creatinine, Ser 1.06; Hemoglobin 14.7; Platelets 297; Potassium 4.3; Sodium 141    Lipid Panel    Component Value Date/Time   CHOL 177 03/28/2018 1138   TRIG 69 03/28/2018 1138   HDL 57 03/28/2018 1138   CHOLHDL 3.1 03/28/2018 1138   CHOLHDL 2.8 11/07/2017 0511   VLDL 4 11/07/2017 0511   LDLCALC 106 (H) 03/28/2018 1138   LDLDIRECT 116 (H) 09/24/2017 1338      Wt Readings from Last 3 Encounters:  04/29/18 182 lb 8 oz (82.8 kg)  03/28/18 181 lb (82.1 kg)  11/26/17 188 lb (85.3 kg)       PAD Screen 03/13/2016  Previous PAD dx? No  Previous surgical procedure? No  Pain with walking? No  Feet/toe relief with dangling? Yes  Painful, non-healing ulcers? No  Extremities discolored? No      ASSESSMENT AND PLAN:  1.  Coronary artery disease involving native coronary arteries without angina: He is doing extremely well with no anginal  symptoms.  Continue medical therapy.  Continue long-term dual antiplatelet therapy as tolerated.  I  2.  Chronic systolic heart failure: He appears to be euvolemic.  Most recent ejection fraction was 50 to 55%.  3.  Hyperlipidemia: Currently on high-dose atorvastatin.  Most recent LDL was 103 which is not at target.  I added Zetia 10 mg daily and discussed with him the importance of healthy diet.  Repeat lipid and liver profile in 6 weeks.  4.  Erectile dysfunction: Could be due to treatment with metoprolol especially with underlying bradycardia.  I discontinued Toprol today.  5.  Essential hypertension: Blood pressure is running on the high side.  I increased losartan to 50 mg daily.   Disposition:   FU with me in 6 months  Signed,  Kathlyn Sacramento, MD  04/29/2018 3:12 PM    [place]

## 2018-05-15 ENCOUNTER — Other Ambulatory Visit: Payer: Self-pay | Admitting: Internal Medicine

## 2018-06-09 ENCOUNTER — Other Ambulatory Visit: Payer: Medicare HMO

## 2018-06-09 ENCOUNTER — Other Ambulatory Visit (INDEPENDENT_AMBULATORY_CARE_PROVIDER_SITE_OTHER): Payer: Medicare HMO

## 2018-06-09 DIAGNOSIS — E785 Hyperlipidemia, unspecified: Secondary | ICD-10-CM | POA: Diagnosis not present

## 2018-06-10 LAB — HEPATIC FUNCTION PANEL
ALT: 22 IU/L (ref 0–44)
AST: 22 IU/L (ref 0–40)
Albumin: 4.5 g/dL (ref 3.6–4.8)
Alkaline Phosphatase: 59 IU/L (ref 39–117)
Bilirubin Total: 1.2 mg/dL (ref 0.0–1.2)
Bilirubin, Direct: 0.28 mg/dL (ref 0.00–0.40)
TOTAL PROTEIN: 7 g/dL (ref 6.0–8.5)

## 2018-06-10 LAB — LIPID PANEL
CHOLESTEROL TOTAL: 131 mg/dL (ref 100–199)
Chol/HDL Ratio: 2.7 ratio (ref 0.0–5.0)
HDL: 49 mg/dL (ref >39)
LDL CALC: 72 mg/dL (ref 0–99)
TRIGLYCERIDES: 52 mg/dL (ref 0–149)
VLDL CHOLESTEROL CAL: 10 mg/dL (ref 5–40)

## 2018-06-13 ENCOUNTER — Telehealth: Payer: Self-pay | Admitting: *Deleted

## 2018-06-13 NOTE — Telephone Encounter (Signed)
Patient made aware of results and verbalized understanding.  The patient stated that he has been having a tingle in his left arm at night when he lays on his arm. He stated that it felt like nerve pain. He does not have this feeling during the day. This has been going on for the last 2-3 weeks. He stated that this was the only symptom he was having and wanted to know if Dr. Fletcher Anon felt like this should be of any concern. He has been advised to try and avoid lying on that side for a few days to see if the symptom is alleviated but that the message would be sent to Dr. Fletcher Anon for any further recommendations.

## 2018-06-13 NOTE — Telephone Encounter (Signed)
-----   Message from Wellington Hampshire, MD sent at 06/12/2018  1:41 PM EST ----- Inform patient that labs were normal. Cholesterol was excellent.  Continue same medications.

## 2018-06-17 NOTE — Telephone Encounter (Signed)
I do not think these are cardiac symptoms.  Likely neuropathy.

## 2018-06-17 NOTE — Telephone Encounter (Signed)
Patient made aware and verbalized his understanding.  

## 2018-06-24 ENCOUNTER — Other Ambulatory Visit: Payer: Self-pay

## 2018-06-24 MED ORDER — EZETIMIBE 10 MG PO TABS: 10.0000 mg | ORAL_TABLET | Freq: Every day | ORAL | 3 refills | Status: DC

## 2018-09-26 ENCOUNTER — Ambulatory Visit: Payer: Medicare HMO | Admitting: Internal Medicine

## 2018-10-02 ENCOUNTER — Ambulatory Visit: Payer: Medicare HMO | Admitting: Internal Medicine

## 2018-10-03 ENCOUNTER — Other Ambulatory Visit: Payer: Self-pay

## 2018-10-03 ENCOUNTER — Encounter: Payer: Self-pay | Admitting: Internal Medicine

## 2018-10-03 ENCOUNTER — Ambulatory Visit (INDEPENDENT_AMBULATORY_CARE_PROVIDER_SITE_OTHER): Payer: Medicare HMO | Admitting: Internal Medicine

## 2018-10-03 VITALS — BP 128/74 | HR 67 | Ht 67.0 in | Wt 182.0 lb

## 2018-10-03 DIAGNOSIS — M25512 Pain in left shoulder: Secondary | ICD-10-CM

## 2018-10-03 DIAGNOSIS — G8929 Other chronic pain: Secondary | ICD-10-CM

## 2018-10-03 DIAGNOSIS — I10 Essential (primary) hypertension: Secondary | ICD-10-CM | POA: Diagnosis not present

## 2018-10-03 DIAGNOSIS — I25118 Atherosclerotic heart disease of native coronary artery with other forms of angina pectoris: Secondary | ICD-10-CM | POA: Diagnosis not present

## 2018-10-03 HISTORY — DX: Atherosclerotic heart disease of native coronary artery with other forms of angina pectoris: I25.118

## 2018-10-03 MED ORDER — NITROGLYCERIN 0.4 MG SL SUBL: 0.4000 mg | SUBLINGUAL_TABLET | SUBLINGUAL | 0 refills | Status: DC | PRN

## 2018-10-03 NOTE — Progress Notes (Signed)
Date:  10/03/2018   Name:  Todd Weaver   DOB:  July 21, 1951   MRN:  [id number]   Chief Complaint: Hypertension (6 month follow up)  Hypertension  This is a chronic problem. The problem is controlled. Pertinent negatives include no chest pain, headaches, palpitations or shortness of breath. Past treatments include diuretics and angiotensin blockers. The current treatment provides significant improvement.  Shoulder Pain   The pain is present in the left shoulder. This is a chronic problem. The current episode started more than 1 month ago. There has been a history of trauma. The problem occurs daily. The quality of the pain is described as aching. The pain is mild. Associated symptoms include an inability to bear weight and a limited range of motion. Pertinent negatives include no fever, joint swelling, numbness or stiffness. The symptoms are aggravated by activity and lying down. He has tried NSAIDS for the symptoms. The treatment provided mild relief. hx of right rotator cuff repair    Review of Systems  Constitutional: Negative for fever.  Respiratory: Negative for cough, chest tightness and shortness of breath.   Cardiovascular: Negative for chest pain, palpitations and leg swelling.  Gastrointestinal: Negative for abdominal pain.  Musculoskeletal: Positive for arthralgias. Negative for gait problem, joint swelling and stiffness.  Neurological: Negative for dizziness, numbness and headaches.    Patient Active Problem List   Diagnosis Date Noted  . Daytime sleepiness 09/23/2017  . Coronary artery disease 08/15/2017  . History of ST elevation myocardial infarction (STEMI) 07/18/2017  . Hypertension 06/19/2017  . Hyperlipidemia 06/19/2017  . History of CVA (cerebrovascular accident) 06/19/2017  . Overweight (BMI 25.0-29.9) 06/19/2017  . BPH (benign prostatic hyperplasia) 06/19/2017  . ED (erectile dysfunction) 06/19/2017  . MCI (mild cognitive impairment) 06/19/2017  . FH:  Alzheimer's disease 06/19/2017  . Lumbar spondylosis with myelopathy 11/08/2016  . Hand dermatitis 07/26/2014    Allergies  Allergen Reactions  . Codeine Hives  . Shellfish Allergy Hives and Swelling  . Contrast Media [Iodinated Diagnostic Agents] Itching and Other (See Comments)    [place]ps on tongue    Past Surgical History:  Procedure Laterality Date  . ANAL FISTULECTOMY N/A 12/21/2014   Procedure: FISTULECTOMY ANAL;  Surgeon: Christene Lye, MD;  Location: [facility] ORS;  Service: General;  Laterality: N/A;  . APPENDECTOMY  1975  . CORONARY STENT INTERVENTION N/A 09/09/2017   Procedure: CORONARY STENT INTERVENTION;  Surgeon: Wellington Hampshire, MD;  Location: [place] CV LAB;  Service: Cardiovascular;  Laterality: N/A;  . CORONARY/GRAFT ACUTE MI REVASCULARIZATION N/A 07/18/2017   Procedure: Coronary/Graft Acute MI Revascularization;  Surgeon: Wellington Hampshire, MD;  Location: [place] [facility];  Service: Cardiovascular;  Laterality: N/A;  . HEMORRHOID SURGERY N/A 12/21/2014   Procedure: HEMORRHOIDECTOMY;  Surgeon: Christene Lye, MD;  Location: [facility] ORS;  Service: General;  Laterality: N/A;  . INTRAVASCULAR ULTRASOUND/IVUS N/A 09/09/2017   Procedure: Intravascular Ultrasound/IVUS;  Surgeon: Wellington Hampshire, MD;  Location: [place] [facility];  Service: Cardiovascular;  Laterality: N/A;  . KNEE SURGERY Right age 62  . LEFT HEART CATH AND CORONARY ANGIOGRAPHY N/A 07/18/2017   Procedure: LEFT HEART CATH AND CORONARY ANGIOGRAPHY;  Surgeon: Wellington Hampshire, MD;  Location: [place] [facility];  Service: Cardiovascular;  Laterality: N/A;  . LEFT HEART CATH AND CORONARY ANGIOGRAPHY Left 08/19/2017   Procedure: LEFT HEART CATH AND CORONARY ANGIOGRAPHY;  Surgeon: Wellington Hampshire, MD;  Location: [place] [facility];  Service: Cardiovascular;  Laterality: Left;  . LEFT HEART CATH AND CORONARY ANGIOGRAPHY N/A 11/07/2017   Procedure: LEFT HEART CATH AND CORONARY ANGIOGRAPHY;   Surgeon: Wellington Hampshire, MD;  Location: [place] [facility];  Service: Cardiovascular;  Laterality: N/A;  . ROTATOR CUFF REPAIR Right 2013    Social History   Tobacco Use  . Smoking status: Former Smoker    Types: Cigarettes    Last attempt to quit: 07/24/2007    Years since quitting: 11.2  . Smokeless tobacco: Never Used  . Tobacco comment: smoking cessation materials not required  Substance Use Topics  . Alcohol use: Yes    Alcohol/week: 0.0 standard drinks    Comment: OCC  . Drug use: No     Medication list has been reviewed and updated.  Current Meds  Medication Sig  . aspirin EC 81 MG tablet Take 81 mg by mouth daily.   Marland Kitchen atorvastatin (LIPITOR) 80 MG tablet TAKE 1 TABLET EVERY DAY AT 6:00PM  . clopidogrel (PLAVIX) 75 MG tablet TAKE 1 TABLET EVERY DAY  . ezetimibe (ZETIA) 10 MG tablet Take 1 tablet (10 mg total) by mouth daily.  . furosemide (LASIX) 20 MG tablet TAKE 1 TABLET EVERY DAY  . isosorbide mononitrate (IMDUR) 30 MG 24 hr tablet TAKE 1 TABLET EVERY DAY  . losartan (COZAAR) 25 MG tablet TAKE 1 TABLET EVERY DAY  . nitroGLYCERIN (NITROSTAT) 0.4 MG SL tablet Place 1 tablet (0.4 mg total) under the tongue every 5 (five) minutes as needed for chest pain.  . potassium chloride (K-DUR,KLOR-CON) 10 MEQ tablet TAKE 1 TABLET EVERY DAY  . tamsulosin (FLOMAX) 0.4 MG CAPS capsule TAKE 1 CAPSULE EVERY DAY    PHQ 2/9 Scores 10/03/2018 03/28/2018 10/23/2017 08/01/2017  PHQ - 2 Score 0 0 1 0  PHQ- 9 Score - - 5 2    Physical Exam Vitals signs and nursing note reviewed.  Constitutional:      General: He is not in acute distress.    Appearance: Normal appearance. He is well-developed.  HENT:     Head: Normocephalic and atraumatic.  Eyes:     Pupils: Pupils are equal, round, and reactive to light.  Neck:     Musculoskeletal: Normal range of motion and neck supple.     Vascular: No carotid bruit.  Cardiovascular:     Rate and Rhythm: Normal rate and regular rhythm.   Pulmonary:     Effort: Pulmonary effort is normal. No respiratory distress.     Breath sounds: Normal breath sounds. No wheezing.  Musculoskeletal:     Left shoulder: He exhibits decreased range of motion and tenderness. He exhibits no bony tenderness, no swelling and no effusion.  Skin:    General: Skin is warm and dry.     Findings: No rash.  Neurological:     Mental Status: He is alert and oriented to person, place, and time.  Psychiatric:        Behavior: Behavior normal.        Thought Content: Thought content normal.     Wt Readings from Last 3 Encounters:  10/03/18 182 lb (82.6 kg)  04/29/18 182 lb 8 oz (82.8 kg)  03/28/18 181 lb (82.1 kg)    BP 128/74   Pulse 67   Ht 5\' 7"  (1.702 m)   Wt 182 lb (82.6 kg)   SpO2 95%   BMI 28.51 kg/m   Assessment and Plan: 1. Essential hypertension controlled  2. Coronary artery disease of native artery  of native heart with stable angina pectoris (HCC) Stable with no recent angina Needs fresh NTG - nitroGLYCERIN (NITROSTAT) 0.4 MG SL tablet; Place 1 tablet (0.4 mg total) under the tongue every 5 (five) minutes as needed for chest pain.  Dispense: 30 tablet; Refill: 0  3. Chronic left shoulder pain Refer to Ortho - Ambulatory referral to Orthopedic Surgery   Partially dictated using Dragon software. Any errors are unintentional.  Halina Maidens, MD [facility]  10/03/2018

## 2018-10-10 DIAGNOSIS — M7542 Impingement syndrome of left shoulder: Secondary | ICD-10-CM | POA: Diagnosis not present

## 2018-10-13 ENCOUNTER — Other Ambulatory Visit: Payer: Self-pay | Admitting: Internal Medicine

## 2018-10-14 DIAGNOSIS — M7542 Impingement syndrome of left shoulder: Secondary | ICD-10-CM

## 2018-10-14 DIAGNOSIS — M25512 Pain in left shoulder: Secondary | ICD-10-CM | POA: Diagnosis not present

## 2018-10-14 HISTORY — DX: Impingement syndrome of left shoulder: M75.42

## 2018-10-16 DIAGNOSIS — M7542 Impingement syndrome of left shoulder: Secondary | ICD-10-CM | POA: Diagnosis not present

## 2018-10-16 DIAGNOSIS — M25512 Pain in left shoulder: Secondary | ICD-10-CM | POA: Diagnosis not present

## 2018-10-23 DIAGNOSIS — M25512 Pain in left shoulder: Secondary | ICD-10-CM | POA: Diagnosis not present

## 2018-10-23 DIAGNOSIS — M7542 Impingement syndrome of left shoulder: Secondary | ICD-10-CM | POA: Diagnosis not present

## 2018-10-27 DIAGNOSIS — M25512 Pain in left shoulder: Secondary | ICD-10-CM | POA: Diagnosis not present

## 2018-10-27 DIAGNOSIS — M7542 Impingement syndrome of left shoulder: Secondary | ICD-10-CM | POA: Diagnosis not present

## 2018-11-04 DIAGNOSIS — M25512 Pain in left shoulder: Secondary | ICD-10-CM | POA: Diagnosis not present

## 2018-11-06 DIAGNOSIS — M7542 Impingement syndrome of left shoulder: Secondary | ICD-10-CM | POA: Diagnosis not present

## 2018-11-06 DIAGNOSIS — M25512 Pain in left shoulder: Secondary | ICD-10-CM | POA: Diagnosis not present

## 2018-11-14 DIAGNOSIS — M7542 Impingement syndrome of left shoulder: Secondary | ICD-10-CM | POA: Diagnosis not present

## 2018-11-14 DIAGNOSIS — M25512 Pain in left shoulder: Secondary | ICD-10-CM | POA: Diagnosis not present

## 2018-11-17 DIAGNOSIS — M7542 Impingement syndrome of left shoulder: Secondary | ICD-10-CM | POA: Diagnosis not present

## 2018-11-17 DIAGNOSIS — M25512 Pain in left shoulder: Secondary | ICD-10-CM | POA: Diagnosis not present

## 2018-11-18 ENCOUNTER — Telehealth: Payer: Self-pay

## 2018-11-18 NOTE — Telephone Encounter (Signed)
Consent confirmed verbally.

## 2018-11-18 NOTE — Telephone Encounter (Signed)
   TELEPHONE CALL NOTE  This patient has been deemed a candidate for follow-up tele-health visit to limit community exposure during the Covid-19 pandemic. I spoke with the patient via phone to discuss instructions. This has been outlined on the patient's AVS (dotphrase: [email or web address]). The patient was advised to review the section on consent for treatment as well. The patient will receive a phone call 2-3 days prior to their E-Visit at which time consent will be verbally confirmed.   A Virtual Office Visit appointment type has been scheduled for 4/29 with Ignacia Bayley, NP , with "VIDEO" or "TELEPHONE" in the appointment notes - patient prefers video type.  I have either confirmed the patient is active in MyChart or offered to send sign-up link to phone/email via Mychart icon beside patient's photo.  Verlon Au, RN 11/18/2018 9:54 AM

## 2018-11-19 ENCOUNTER — Other Ambulatory Visit: Payer: Self-pay

## 2018-11-19 ENCOUNTER — Encounter: Payer: Self-pay | Admitting: Nurse Practitioner

## 2018-11-19 ENCOUNTER — Telehealth (INDEPENDENT_AMBULATORY_CARE_PROVIDER_SITE_OTHER): Payer: Medicare HMO | Admitting: Nurse Practitioner

## 2018-11-19 VITALS — BP 129/67 | HR 61 | Ht 67.0 in | Wt 188.0 lb

## 2018-11-19 DIAGNOSIS — E785 Hyperlipidemia, unspecified: Secondary | ICD-10-CM

## 2018-11-19 DIAGNOSIS — I2510 Atherosclerotic heart disease of native coronary artery without angina pectoris: Secondary | ICD-10-CM | POA: Diagnosis not present

## 2018-11-19 DIAGNOSIS — I739 Peripheral vascular disease, unspecified: Secondary | ICD-10-CM

## 2018-11-19 DIAGNOSIS — I10 Essential (primary) hypertension: Secondary | ICD-10-CM

## 2018-11-19 NOTE — Progress Notes (Signed)
Virtual Visit via Video Note   This visit type was conducted due to national recommendations for restrictions regarding the COVID-19 Pandemic (e.g. social distancing) in an effort to limit this patient's exposure and mitigate transmission in our community.  Due to his co-morbid illnesses, this patient is at least at moderate risk for complications without adequate follow up.  This format is felt to be most appropriate for this patient at this time.  All issues noted in this document were discussed and addressed.  A limited physical exam was performed with this format.  Please refer to the patient's chart for his consent to telehealth for [facility]. Evaluation Performed:  Follow-up visit  This visit type was conducted due to national recommendations for restrictions regarding the COVID-19 Pandemic (e.g. social distancing).  This format is felt to be most appropriate for this patient at this time.  All issues noted in this document were discussed and addressed.  No physical exam was performed (except for noted visual exam findings with Video Visits).  Please refer to the patient's chart (Todd Weaver message for video visits and phone note for telephone visits) for the patient's consent to telehealth for Todd Weaver. _____________   Date:  11/19/2018   Patient ID:  Todd Weaver, DOB 09/29/1950, MRN [id number] Patient Location:  Home Provider location:   Office   Primary Care Provider:  Glean Hess, MD Primary Cardiologist:  Todd Sacramento, MD  Chief Complaint    68 y/o ? w/ a h/o CAD s/p RCA and LAD stenting, HTN, HL, CVA, and GERD, who presents for f/u of CAD.  Past Medical History    Past Medical History:  Diagnosis Date   Acute ST elevation myocardial infarction (STEMI) of inferior wall ([employer]) 07/18/2017   CAD (coronary artery disease)    a. 03/2016 MV: EF 55-65%, low risk; b. 06/2017 Inf STEMI: LM 85d, LAD 60p, 70m, LCX 40p, RCA 99ost (3.25x18 Xience [place] DES/3.0x38  [place] DES), RPAV 60; c. 07/2017 Cath: LM 85d, LAD 85ost, 60p, 6m, RI 50ost, LCX 50p, RCA patent stent, RPAD/RPAV min irregs; c. 08/2017 PCI dLM/LAD (3.0x38 Xience [place] DES); d. 10/2017 Cath: LM/prox LAD patent, LAD 62m, 40d, RI 70, LCX 40p, RCA patent stent. EF 50-55   Contrast media allergy    GERD (gastroesophageal reflux disease)    Hemorrhoid    Hyperlipidemia    Hypertension    Ischemic cardiomyopathy    a. 06/2017 Echo: EF 45-50%, mod inf/infsept HK. Mildly dil LA.   PAF (paroxysmal atrial fibrillation) ([place])    a. Brief episode during December hospitalization-->converted on IV amio-->not on [place].   Stroke [place]) 2011   "2 MINI STROKES"   Unstable angina [facility])    Past Surgical History:  Procedure Laterality Date   ANAL FISTULECTOMY N/A 12/21/2014   Procedure: FISTULECTOMY ANAL;  Surgeon: Todd Lye, MD;  Location: [facility] ORS;  Service: General;  Laterality: N/A;   APPENDECTOMY  1975   CORONARY STENT INTERVENTION N/A 09/09/2017   Procedure: CORONARY STENT INTERVENTION;  Surgeon: Todd Hampshire, MD;  Location: [place] [facility];  Service: Cardiovascular;  Laterality: N/A;   CORONARY/GRAFT ACUTE MI REVASCULARIZATION N/A 07/18/2017   Procedure: Coronary/Graft Acute MI Revascularization;  Surgeon: Todd Hampshire, MD;  Location: [place] [facility];  Service: Cardiovascular;  Laterality: N/A;   HEMORRHOID SURGERY N/A 12/21/2014   Procedure: HEMORRHOIDECTOMY;  Surgeon: Todd Lye, MD;  Location: [facility] ORS;  Service: General;  Laterality: N/A;   INTRAVASCULAR ULTRASOUND/IVUS N/A  09/09/2017   Procedure: Intravascular Ultrasound/IVUS;  Surgeon: Todd Hampshire, MD;  Location: [place] [facility];  Service: Cardiovascular;  Laterality: N/A;   KNEE SURGERY Right age 57   LEFT HEART CATH AND CORONARY ANGIOGRAPHY N/A 07/18/2017   Procedure: LEFT HEART CATH AND CORONARY ANGIOGRAPHY;  Surgeon: Todd Hampshire, MD;  Location: [place] [facility];   Service: Cardiovascular;  Laterality: N/A;   LEFT HEART CATH AND CORONARY ANGIOGRAPHY Left 08/19/2017   Procedure: LEFT HEART CATH AND CORONARY ANGIOGRAPHY;  Surgeon: Todd Hampshire, MD;  Location: [place] [facility];  Service: Cardiovascular;  Laterality: Left;   LEFT HEART CATH AND CORONARY ANGIOGRAPHY N/A 11/07/2017   Procedure: LEFT HEART CATH AND CORONARY ANGIOGRAPHY;  Surgeon: Todd Hampshire, MD;  Location: [place] [facility];  Service: Cardiovascular;  Laterality: N/A;   ROTATOR CUFF REPAIR Right 2013    Allergies  Allergies  Allergen Reactions   Codeine Hives   Shellfish Allergy Hives and Swelling   Contrast Media [Iodinated Diagnostic Agents] Itching and Other (See Comments)    Whelps on tongue    History of Present Illness    Todd Weaver is a 68 y.o. male who presents via audio/video conferencing for a telehealth visit today.  We tried through 2 different variables platforms to obtain a good video connection however, we were never able to achieve this and as result, this was a phone only visit.  As above, he has a h/o CAD s/p inf STEMI in 06/2017 req 2 overlapping DES  RCA.  Post procedure course was complicated by Afib, which resolved w/ amio.  He subsequently underwent staged PCI of the LAD in early 2019.  In 10/2017, he was admitted with recurrent rest and exertional chest pain.  He r/o and underwent cath revealing stable anatomy w/ a 70% Ramus stenosis, which was medically managed.  Other hx includes HTN, HL, CVA, and GERD.  We did have to switch him from Brilinta to Plavix due to dyspnea w/ Brilinta.  He was last seen in clinic in 04/2018, at which time he was doing well.  He says today that he is continued to do well from a cardiac standpoint.  He is walking about 30 minutes a day without experiencing chest pain or dyspnea.  He has noted bilateral calf claudication and burning however, after walking about 200 to 300 yards.  He says this is been going on for  about 6 months.  He has never had this evaluated before.  He has also had occasional sharp pains of his legs which are more positional in nature.  In the setting of the COVID-19 pandemic, he has been more sedentary overall.  He says he is gained about 10 pounds as his activity outside has decreased.  He is considering increasing his walking time from 30 minutes to an hour.  He denies palpitations, PND, orthopnea, dizziness, syncope, edema, or early satiety.  The patient does not have symptoms concerning for COVID-19 infection (fever, chills, cough, or new shortness of breath).   Home Medications    Prior to Admission medications   Medication Sig Start Date End Date Taking? Authorizing Provider  aspirin EC 81 MG tablet Take 81 mg by mouth daily.  05/23/09  Yes [provider]  atorvastatin (LIPITOR) 80 MG tablet TAKE 1 TABLET EVERY DAY AT 6:00PM 10/13/18  Yes Todd Hess, MD  clopidogrel (PLAVIX) 75 MG tablet TAKE 1 TABLET EVERY DAY 10/13/18  Yes Todd Hess, MD  furosemide (LASIX) 20 MG tablet TAKE 1 TABLET EVERY DAY 10/13/18  Yes Todd Hess, MD  isosorbide mononitrate (IMDUR) 30 MG 24 hr tablet TAKE 1 TABLET EVERY DAY 10/13/18  Yes Todd Hess, MD  losartan (COZAAR) 25 MG tablet TAKE 1 TABLET EVERY DAY 10/13/18  Yes Todd Hess, MD  metoprolol succinate (TOPROL-XL) 25 MG 24 hr tablet TAKE 1/2 TABLET EVERY DAY 10/13/18  Yes Todd Hess, MD  nitroGLYCERIN (NITROSTAT) 0.4 MG SL tablet Place 1 tablet (0.4 mg total) under the tongue every 5 (five) minutes as needed for chest pain. 10/03/18  Yes Todd Hess, MD  potassium chloride (K-DUR,KLOR-CON) 10 MEQ tablet TAKE 1 TABLET EVERY DAY 10/13/18  Yes Todd Hess, MD  tamsulosin (FLOMAX) 0.4 MG CAPS capsule TAKE 1 CAPSULE EVERY DAY 10/13/18  Yes Todd Hess, MD  ezetimibe (ZETIA) 10 MG tablet Take 1 tablet (10 mg total) by mouth daily. Patient not taking: Reported on 11/19/2018 06/24/18 10/03/18  Todd Hampshire, MD    Review of Systems    As above, bilateral calf claudication after walking 200 to 300 yards for several months now.  He denies chest pain, dyspnea, palpitations, PND, orthopnea, dizziness, syncope, edema, or early satiety.  All other systems reviewed and are otherwise negative except as noted above.  Physical Exam    Vital Signs:  BP 129/67 (BP Location: Left Arm, Patient Position: Sitting)    Pulse 61    Ht 5\' 7"  (1.702 m)    Wt 188 lb (85.3 kg)    BMI 29.44 kg/m   Patient sounds well over the phone and is in good spirits.  Accessory Clinical Findings    Lab Results  Component Value Date   CREATININE 1.06 03/28/2018   BUN 14 03/28/2018   NA 141 03/28/2018   K 4.3 03/28/2018   CL 104 03/28/2018   CO2 21 03/28/2018    Lab Results  Component Value Date   CHOL 131 06/09/2018   HDL 49 06/09/2018   LDLCALC 72 06/09/2018   LDLDIRECT 116 (H) 09/24/2017   TRIG 52 06/09/2018   CHOLHDL 2.7 06/09/2018    Lab Results  Component Value Date   WBC 7.0 03/28/2018   HGB 14.7 03/28/2018   HCT 45.6 03/28/2018   MCV 84 03/28/2018   PLT 297 03/28/2018    Lab Results  Component Value Date   ALT 22 06/09/2018   AST 22 06/09/2018   ALKPHOS 59 06/09/2018   BILITOT 1.2 06/09/2018     Assessment & Plan    1.  Coronary artery disease: Status post inferior MI in December 2018 with RCA stenting followed by staged left main and LAD stenting in early 2019.  Last catheterization in April 2019 showed stable anatomy with a 70% ramus stenosis.  Patient has done well since his last visit in office without chest pain or dyspnea.  He is walking about 30 minutes/day.  He does experience bilateral calf claudication which is discussed below.  He remains on aspirin, Plavix, statin, nitrate, beta-blocker, ARB, and Zetia therapy.  2.  Essential hypertension: Stable.  3.  Hyperlipidemia: LDL 70 24 May 2018 and he is now on statin and Zetia therapy.  LFTs were normal at that time.  4.   Bilateral lower extremity claudication: Patient reports a several month history of bilateral calf burning and tightness with ambulation of approximately 200 to 300 yards.  This is been going on for several months.  He has  never been evaluated for this before.  I will arrange for bilateral lower extremity arterial duplex.  Continue statin and aspirin therapy.  4.  History of paroxysmal atrial fibrillation: This occurred in the setting of inferior MI.  No recurrent palpitations.  He remains on beta-blocker therapy.  5.  Morbid obesity: He has gained about 10 pounds in the past 6 weeks in the setting of being more sedentary as result of stay at home orders.  He is walking 30 minutes a day.  I did encourage him to increase his activity and reduce caloric intake.  He is drinking about 4 Bud Light's a day and I recommended that he cut back to 0-1.    6.  Disposition: Follow-up lower extremity arterial duplex.  Follow-up in clinic in 3 months or sooner if necessary.  COVID-19 Education: The signs and symptoms of COVID-19 were discussed with the patient and how to seek care for testing (follow up with PCP or arrange E-visit).  The importance of social distancing was discussed today.  Patient Risk:   After full review of this patient's history and clinical status, I feel that he is at least moderate risk for cardiac complications at this time, thus necessitating a telehealth visit sooner than our first available in office visit.  Time:   Today, I have spent 25 minutes with the patient with telehealth technology discussing symptoms and mgmt/plan.    Todd Hodgkins, NP 11/19/2018, 12:44 PM

## 2018-11-19 NOTE — Patient Instructions (Signed)
It was a pleasure to speak with you on the phone today! Thank you for allowing [place] to continue taking care of your [facility] needs during this time.   Feel free to call as needed for questions and concerns related to your cardiac needs.   Medication Instructions:  Your physician recommends that you continue on your current medications as directed. Please refer to the Current Medication list given to you today.  If you need a refill on your cardiac medications before your next appointment, please call your pharmacy.   Lab work: None ordered  If you have labs (blood work) drawn today and your tests are completely normal, you will receive your results only by: Marland Kitchen MyChart Message (if you have MyChart) OR . A paper copy in the mail If you have any lab test that is abnormal or we need to change your treatment, we will call you to review the results.  Testing/Procedures: 1- Lower extremity arterial ultrasound  Follow-Up: At [facility], you and your health needs are our priority.  As part of our continuing mission to provide you with exceptional heart care, we have created designated Provider Care Teams.  These Care Teams include your primary Cardiologist (physician) and Advanced Practice Providers (APPs -  Physician Assistants and Nurse Practitioners) who all work together to provide you with the care you need, when you need it. You will need a follow up appointment in 3 months. You may see Kathlyn Sacramento, MD or Murray Hodgkins, NP.

## 2018-11-26 ENCOUNTER — Other Ambulatory Visit: Payer: Self-pay | Admitting: Nurse Practitioner

## 2018-11-26 DIAGNOSIS — I739 Peripheral vascular disease, unspecified: Secondary | ICD-10-CM

## 2018-12-03 ENCOUNTER — Ambulatory Visit (INDEPENDENT_AMBULATORY_CARE_PROVIDER_SITE_OTHER): Payer: Medicare HMO

## 2018-12-03 VITALS — BP 129/78 | HR 63 | Ht 67.0 in | Wt 182.0 lb

## 2018-12-03 DIAGNOSIS — Z1211 Encounter for screening for malignant neoplasm of colon: Secondary | ICD-10-CM

## 2018-12-03 DIAGNOSIS — Z0000 Encounter for general adult medical examination without abnormal findings: Secondary | ICD-10-CM

## 2018-12-03 NOTE — Progress Notes (Signed)
Subjective:   Todd Weaver is a 68 y.o. male who presents for an Initial Medicare Annual Wellness Visit.  Virtual Visit via Telephone Note  I connected with Todd Weaver on 12/03/18 at  9:20 AM EDT by telephone and verified that I am speaking with the correct person using two identifiers.  Medicare Annual Wellness visit completed telephonically due to Covid-19 pandemic.   Location: Patient: home Provider: office   I discussed the limitations, risks, security and privacy concerns of performing an evaluation and management service by telephone and the availability of in person appointments. The patient expressed understanding and agreed to proceed.  Some vital signs may be absent or patient reported.   Clemetine Marker, LPN   Review of Systems   Cardiac Risk Factors include: advanced age (>56men, >8 women);dyslipidemia;male gender;hypertension    Objective:    Today's Vitals   12/03/18 0924 12/03/18 0925  BP: 129/78   Pulse: 63   Weight: 182 lb (82.6 kg)   Height: 5\' 7"  (1.702 m)   PainSc:  8    Body mass index is 28.51 kg/m.  Advanced Directives 12/03/2018 11/06/2017 11/06/2017 11/06/2017 08/19/2017 08/01/2017 07/18/2017  Does Patient Have a Medical Advance Directive? No No No No No No No  Would patient like information on creating a medical advance directive? Yes (MAU/Ambulatory/Procedural Areas - Information given) No - Patient declined Yes (Inpatient - patient requests chaplain consult to create a medical advance directive) - Yes (Inpatient - patient defers creating a medical advance directive at this time) No - Patient declined Yes (Inpatient - patient requests chaplain consult to create a medical advance directive)    Current Medications (verified) Outpatient Encounter Medications as of 12/03/2018  Medication Sig  . aspirin EC 81 MG tablet Take 81 mg by mouth daily.   Marland Kitchen atorvastatin (LIPITOR) 80 MG tablet TAKE 1 TABLET EVERY DAY AT 6:00PM  . clopidogrel (PLAVIX) 75  MG tablet TAKE 1 TABLET EVERY DAY  . furosemide (LASIX) 20 MG tablet TAKE 1 TABLET EVERY DAY  . isosorbide mononitrate (IMDUR) 30 MG 24 hr tablet TAKE 1 TABLET EVERY DAY  . losartan (COZAAR) 25 MG tablet TAKE 1 TABLET EVERY DAY  . metoprolol succinate (TOPROL-XL) 25 MG 24 hr tablet TAKE 1/2 TABLET EVERY DAY  . nitroGLYCERIN (NITROSTAT) 0.4 MG SL tablet Place 1 tablet (0.4 mg total) under the tongue every 5 (five) minutes as needed for chest pain.  . potassium chloride (K-DUR,KLOR-CON) 10 MEQ tablet TAKE 1 TABLET EVERY DAY  . tamsulosin (FLOMAX) 0.4 MG CAPS capsule TAKE 1 CAPSULE EVERY DAY  . [DISCONTINUED] ezetimibe (ZETIA) 10 MG tablet Take 1 tablet (10 mg total) by mouth daily. (Patient not taking: Reported on 11/19/2018)   No facility-administered encounter medications on file as of 12/03/2018.     Allergies (verified) Codeine; Shellfish allergy; and Contrast media [iodinated diagnostic agents]   History: Past Medical History:  Diagnosis Date  . Acute ST elevation myocardial infarction (STEMI) of inferior wall ([place]) 07/18/2017  . CAD (coronary artery disease)    a. 03/2016 MV: EF 55-65%, low risk; b. 06/2017 Inf STEMI: LM 85d, LAD 60p, 29m, LCX 40p, RCA 99ost (3.25x18 Xience [place] DES/3.0x38 [place] DES), RPAV 60; c. 07/2017 Cath: LM 85d, LAD 85ost, 60p, 25m, RI 50ost, LCX 50p, RCA patent stent, RPAD/RPAV min irregs; c. 08/2017 PCI dLM/LAD (3.0x38 Xience [place] DES); d. 10/2017 Cath: LM/prox LAD patent, LAD 26m, 40d, RI 70, LCX 40p, RCA patent stent. EF 50-55  . Contrast media  allergy   . GERD (gastroesophageal reflux disease)   . Hemorrhoid   . Hyperlipidemia   . Hypertension   . Ischemic cardiomyopathy    a. 06/2017 Echo: EF 45-50%, mod inf/infsept HK. Mildly dil LA.  Marland Kitchen PAF (paroxysmal atrial fibrillation) ([place])    a. Brief episode during December hospitalization-->converted on IV amio-->not on [place].  Marland Kitchen Stroke [facility]) 2011   "2 MINI STROKES"  . Unstable angina [facility])    Past Surgical  History:  Procedure Laterality Date  . ANAL FISTULECTOMY N/A 12/21/2014   Procedure: FISTULECTOMY ANAL;  Surgeon: Christene Lye, MD;  Location: [facility] ORS;  Service: General;  Laterality: N/A;  . APPENDECTOMY  1975  . CORONARY STENT INTERVENTION N/A 09/09/2017   Procedure: CORONARY STENT INTERVENTION;  Surgeon: Wellington Hampshire, MD;  Location: [place] CV LAB;  Service: Cardiovascular;  Laterality: N/A;  . CORONARY/GRAFT ACUTE MI REVASCULARIZATION N/A 07/18/2017   Procedure: Coronary/Graft Acute MI Revascularization;  Surgeon: Wellington Hampshire, MD;  Location: [place] CV LAB;  Service: Cardiovascular;  Laterality: N/A;  . HEMORRHOID SURGERY N/A 12/21/2014   Procedure: HEMORRHOIDECTOMY;  Surgeon: Christene Lye, MD;  Location: [facility] ORS;  Service: General;  Laterality: N/A;  . INTRAVASCULAR ULTRASOUND/IVUS N/A 09/09/2017   Procedure: Intravascular Ultrasound/IVUS;  Surgeon: Wellington Hampshire, MD;  Location: [place] CV LAB;  Service: Cardiovascular;  Laterality: N/A;  . KNEE SURGERY Right age 70  . LEFT HEART CATH AND CORONARY ANGIOGRAPHY N/A 07/18/2017   Procedure: LEFT HEART CATH AND CORONARY ANGIOGRAPHY;  Surgeon: Wellington Hampshire, MD;  Location: [place] CV LAB;  Service: Cardiovascular;  Laterality: N/A;  . LEFT HEART CATH AND CORONARY ANGIOGRAPHY Left 08/19/2017   Procedure: LEFT HEART CATH AND CORONARY ANGIOGRAPHY;  Surgeon: Wellington Hampshire, MD;  Location: [place] CV LAB;  Service: Cardiovascular;  Laterality: Left;  . LEFT HEART CATH AND CORONARY ANGIOGRAPHY N/A 11/07/2017   Procedure: LEFT HEART CATH AND CORONARY ANGIOGRAPHY;  Surgeon: Wellington Hampshire, MD;  Location: [place] CV LAB;  Service: Cardiovascular;  Laterality: N/A;  . ROTATOR CUFF REPAIR Right 2013   Family History  Problem Relation Age of Onset  . Hypertension Mother   . Alzheimer's disease Mother   . Hypertension Father   . Alzheimer's disease Father    Social History    Socioeconomic History  . Marital status: Married    Spouse name: Not on file  . Number of children: 2  . Years of education: Not on file  . Highest education level: 11th grade  Occupational History  . Occupation: Retired  Scientific laboratory technician  . Financial resource strain: Not hard at all  . Food insecurity:    Worry: Never true    Inability: Never true  . Transportation needs:    Medical: No    Non-medical: No  Tobacco Use  . Smoking status: Former Smoker    Types: Cigarettes    Last attempt to quit: 07/24/2007    Years since quitting: 11.3  . Smokeless tobacco: Never Used  . Tobacco comment: smoking cessation materials not required  Substance and Sexual Activity  . Alcohol use: Yes    Alcohol/week: 0.0 standard drinks    Comment: OCC  . Drug use: No  . Sexual activity: Not Currently  Lifestyle  . Physical activity:    Days per week: 3 days    Minutes per session: 60 min  . Stress: Only a little  Relationships  . Social connections:  Talks on phone: More than three times a week    Gets together: Three times a week    Attends religious service: More than 4 times per year    Active member of club or organization: No    Attends meetings of clubs or organizations: Never    Relationship status: Married  Other Topics Concern  . Not on file  Social History Narrative   Lives in [place]. Retired.  Completed cardiac rehab program.   Tobacco Counseling Counseling given: Not Answered Comment: smoking cessation materials not required   Clinical Intake:  Pre-visit preparation completed: Yes  Pain : 0-10 Pain Score: 8  Pain Type: Acute pain Pain Location: Foot Pain Orientation: Right, Posterior(achilles tendon) Pain Descriptors / Indicators: Discomfort Pain Onset: In the past 7 days Pain Frequency: Occasional     BMI - recorded: 28.51 Nutritional Status: BMI 25 -29 Overweight Nutritional Risks: Nausea/ vomitting/ diarrhea Diabetes: No  How often do you need to have  someone help you when you read instructions, pamphlets, or other written materials from your doctor or pharmacy?: 1 - Never  Interpreter Needed?: No  Information entered by :: Clemetine Marker LPN  Activities of Daily Living In your present state of health, do you have any difficulty performing the following activities: 12/03/2018  Hearing? N  Comment declines hearing aids  Vision? N  Comment wears glasses  Difficulty concentrating or making decisions? N  Walking or climbing stairs? N  Dressing or bathing? N  Doing errands, shopping? N  Preparing Food and eating ? N  Using the Toilet? N  In the past six months, have you accidently leaked urine? N  Do you have problems with loss of bowel control? N  Managing your Medications? N  Managing your Finances? N  Housekeeping or managing your Housekeeping? N  Some recent data might be hidden     Immunizations and Health Maintenance Immunization History  Administered Date(s) Administered  . Influenza,inj,Quad PF,6+ Mos 06/19/2017, 03/28/2018  . Pneumococcal Conjugate-13 03/28/2018  . Tdap 06/19/2017   Health Maintenance Due  Topic Date Due  . COLONOSCOPY  11/27/2002    Patient Care Team: Glean Hess, MD as PCP - General (Internal Medicine) Wellington Hampshire, MD as PCP - Cardiology (Cardiology) Christene Lye, MD (General Surgery) Sherre Lain, MD as Referring Physician (Dermatology)  Indicate any recent Medical Services you may have received from other than [facility] providers in the past year (date may be approximate).    Assessment:   This is a routine wellness examination for Todd Weaver.  Hearing/Vision screen Hearing Screening Comments: Pt denies hearing difficulty but c/o wax build up Vision Screening Comments: [facility] screen[facility]  Dietary issues and exercise activities discussed: Current Exercise Habits: Home exercise routine, Type of exercise: walking;strength training/weights, Time  (Minutes): 60, Frequency (Times/Week): 3, Weekly Exercise (Minutes/Week): 180, Intensity: Moderate, Exercise limited by: cardiac condition(s);orthopedic condition(s)  Goals    . Increase physical activity     Pt would like to increase physical activity to 150 minutes a week      Depression Screen PHQ 2/9 Scores 12/03/2018 10/03/2018 03/28/2018 10/23/2017  PHQ - 2 Score 0 0 0 1  PHQ- 9 Score - - - 5    Fall Risk Fall Risk  12/03/2018 10/03/2018 03/28/2018 10/21/2017 08/01/2017  Falls in the past year? 0 0 No No No  Number falls in past yr: 0 0 - - -  Injury with Fall? 0 0 - - -  Follow up Falls prevention discussed Falls evaluation completed - - -   FALL RISK PREVENTION PERTAINING TO THE HOME:  Any stairs in or around the home? No  If so, do they handrails? No   Home free of loose throw rugs in walkways, pet beds, electrical cords, etc? Yes  Adequate lighting in your home to reduce risk of falls? Yes   ASSISTIVE DEVICES UTILIZED TO PREVENT FALLS:  Life alert? No  Use of a cane, walker or w/c? No  Grab bars in the bathroom? No  Shower chair or bench in shower? No  Elevated toilet seat or a handicapped toilet? No   DME ORDERS:  DME order needed?  No   TIMED UP AND GO:  Was the test performed? No . Telephonic visit.  Education: Fall risk prevention has been discussed.  Intervention(s) required? No   Cognitive Function:     6CIT Screen 12/03/2018  What Year? 0 points  What month? 0 points  What time? 0 points  Count back from 20 0 points  Months in reverse 0 points  Repeat phrase 4 points  Total Score 4    Screening Tests Health Maintenance  Topic Date Due  . COLONOSCOPY  11/27/2002  . INFLUENZA VACCINE  02/21/2019  . PNA vac Low Risk Adult (2 of 2 - PPSV23) 03/29/2019  . TETANUS/TDAP  06/20/2027  . Hepatitis C Screening  Completed    Qualifies for Shingles Vaccine? Yes  . Due for Shingrix. Education has been provided regarding the importance of this vaccine. Pt has  been advised to call insurance company to determine out of pocket expense. Advised may also receive vaccine at local pharmacy or Health Dept. Verbalized acceptance and understanding.  Tdap :Up to date  Flu Vaccine: Up to date  Pneumococcal Vaccine: Due for second Pneumococcal vaccine in September 2020.   Cancer Screenings:  Colorectal Screening: Completed many years ago per patient. Referral to GI placed today. Pt aware the office will call re: appt.  Lung Cancer Screening: (Low Dose CT Chest recommended if Age 52-80 years, 30 pack-year currently smoking OR have quit w/in 15years.) does not qualify.    Additional Screening:  Hepatitis C Screening: does qualify; Completed 03/28/18  Vision Screening: Recommended annual ophthalmology exams for early detection of glaucoma and other disorders of the eye. Is the patient up to date with their annual eye exam?  Yes  Who is the provider or what is the name of the office in which the pt attends annual eye exams? [place] Screening: Recommended annual dental exams for proper oral hygiene  Community Resource Referral:  CRR required this visit?  No       Plan:    I have personally reviewed and addressed the Medicare Annual Wellness questionnaire and have noted the following in the patient's chart:  A. Medical and social history B. Use of alcohol, tobacco or illicit drugs  C. Current medications and supplements D. Functional ability and status E.  Nutritional status F.  Physical activity G. Advance directives H. List of other physicians I.  Hospitalizations, surgeries, and ER visits in previous 12 months J.  [place] such as hearing and vision if needed, cognitive and depression L. Referrals and appointments   In addition, I have reviewed and discussed with patient certain preventive protocols, quality metrics, and best practice recommendations. A written personalized care plan for preventive services as  well as general preventive health recommendations were provided to patient.  Signed,  Clemetine Marker, LPN Nurse Health Advisor   Nurse Notes: pt c/o right middle finger "locking up" sometimes. He attributes this to playing guitar for many years and how he holds the pick. He also c/o right achilles heel pain for past 3 days causing swelling and discomfort but denies any injury or incident that may have caused it. He is still able to walk and states it is starting to feel better. Advised patient to contact us if either problem worsens or persists so that we may refer to ortho if needed. Patient otherwise doing well and appreciative of visit today.

## 2018-12-03 NOTE — Patient Instructions (Signed)
[name] , Thank you for taking time to come for your Medicare Wellness Visit. I appreciate your ongoing commitment to your health goals. Please review the following plan we discussed and let me know if I can assist you in the future.   Screening recommendations/referrals: Colonoscopy: due - referral sent to gastroenterology for screening colonoscopy. They will contact you to set up an appointment.  Recommended yearly ophthalmology/optometry visit for glaucoma screening and checkup. Please call [facility] at [phone] to set up your appointment.  Recommended yearly dental visit for hygiene and checkup  Vaccinations: Influenza vaccine: done 03/28/18 Pneumococcal vaccine: done 03/28/18. Due for second vaccine Sept 2020. Tdap vaccine: done 06/19/17. Shingles vaccine: Shingrix discussed. Please contact your pharmacy for coverage information.   Advanced directives: Advance directive discussed with you today. I have mailed a copy for you to complete at home and have notarized. Once this is complete please bring a copy in to our office so we can scan it into your chart.  Conditions/risks identified: Recommend healthy eating with heart healthy diet and physical activity.    Next appointment: Please follow up in one year for your Medicare Annual Wellness visit.   Preventive Care 60 Years and Older, Male Preventive care refers to lifestyle choices and visits with your health care provider that can promote health and wellness. What does preventive care include?  A yearly physical exam. This is also called an annual well check.  Dental exams once or twice a year.  Routine eye exams. Ask your health care provider how often you should have your eyes checked.  Personal lifestyle choices, including:  Daily care of your teeth and gums.  Regular physical activity.  Eating a healthy diet.  Avoiding tobacco and drug use.  Limiting alcohol use.  Practicing safe sex.  Taking low doses  of aspirin every day.  Taking vitamin and mineral supplements as recommended by your health care provider. What happens during an annual well check? The services and screenings done by your health care provider during your annual well check will depend on your age, overall health, lifestyle risk factors, and family history of disease. Counseling  Your health care provider may ask you questions about your:  Alcohol use.  Tobacco use.  Drug use.  Emotional well-being.  Home and relationship well-being.  Sexual activity.  Eating habits.  History of falls.  Memory and ability to understand (cognition).  Work and work Statistician. Screening  You may have the following tests or measurements:  Height, weight, and BMI.  Blood pressure.  Lipid and cholesterol levels. These may be checked every 5 years, or more frequently if you are over 53 years old.  Skin check.  Lung cancer screening. You may have this screening every year starting at age 38 if you have a 30-pack-year history of smoking and currently smoke or have quit within the past 15 years.  Fecal occult blood test (FOBT) of the stool. You may have this test every year starting at age 39.  Flexible sigmoidoscopy or colonoscopy. You may have a sigmoidoscopy every 5 years or a colonoscopy every 10 years starting at age 1.  Prostate cancer screening. Recommendations will vary depending on your family history and other risks.  Hepatitis C blood test.  Hepatitis B blood test.  Sexually transmitted disease (STD) testing.  Diabetes screening. This is done by checking your blood sugar (glucose) after you have not eaten for a while (fasting). You may have this done every 1-3 years.  Abdominal aortic aneurysm (AAA) screening. You may need this if you are a current or former smoker.  Osteoporosis. You may be screened starting at age 67 if you are at high risk. Talk with your health care provider about your test results,  treatment options, and if necessary, the need for more tests. Vaccines  Your health care provider may recommend certain vaccines, such as:  Influenza vaccine. This is recommended every year.  Tetanus, diphtheria, and acellular pertussis (Tdap, Td) vaccine. You may need a Td booster every 10 years.  Zoster vaccine. You may need this after age 30.  Pneumococcal 13-valent conjugate (PCV13) vaccine. One dose is recommended after age 74.  Pneumococcal polysaccharide (PPSV23) vaccine. One dose is recommended after age 46. Talk to your health care provider about which screenings and vaccines you need and how often you need them. This information is not intended to replace advice given to you by your health care provider. Make sure you discuss any questions you have with your health care provider. Document Released: 08/05/2015 Document Revised: 03/28/2016 Document Reviewed: 05/10/2015 Elsevier Interactive Patient Education  2017 [place] Prevention in the Home Falls can cause injuries. They can happen to people of all ages. There are many things you can do to make your home safe and to help prevent falls. What can I do on the outside of my home?  Regularly fix the edges of walkways and driveways and fix any cracks.  Remove anything that might make you trip as you walk through a door, such as a raised step or threshold.  Trim any bushes or trees on the path to your home.  Use bright outdoor lighting.  Clear any walking paths of anything that might make someone trip, such as rocks or tools.  Regularly check to see if handrails are loose or broken. Make sure that both sides of any steps have handrails.  Any raised decks and porches should have guardrails on the edges.  Have any leaves, snow, or ice cleared regularly.  Use sand or salt on walking paths during winter.  Clean up any spills in your garage right away. This includes oil or grease spills. What can I do in the  bathroom?  Use night lights.  Install grab bars by the toilet and in the tub and shower. Do not use towel bars as grab bars.  Use non-skid mats or decals in the tub or shower.  If you need to sit down in the shower, use a plastic, non-slip stool.  Keep the floor dry. Clean up any water that spills on the floor as soon as it happens.  Remove soap buildup in the tub or shower regularly.  Attach bath mats securely with double-sided non-slip rug tape.  Do not have throw rugs and other things on the floor that can make you trip. What can I do in the bedroom?  Use night lights.  Make sure that you have a light by your bed that is easy to reach.  Do not use any sheets or blankets that are too big for your bed. They should not hang down onto the floor.  Have a firm chair that has side arms. You can use this for support while you get dressed.  Do not have throw rugs and other things on the floor that can make you trip. What can I do in the kitchen?  Clean up any spills right away.  Avoid walking on wet floors.  Keep items that you use  a lot in easy-to-reach places.  If you need to reach something above you, use a strong step stool that has a grab bar.  Keep electrical cords out of the way.  Do not use floor polish or wax that makes floors slippery. If you must use wax, use non-skid floor wax.  Do not have throw rugs and other things on the floor that can make you trip. What can I do with my stairs?  Do not leave any items on the stairs.  Make sure that there are handrails on both sides of the stairs and use them. Fix handrails that are broken or loose. Make sure that handrails are as long as the stairways.  Check any carpeting to make sure that it is firmly attached to the stairs. Fix any carpet that is loose or worn.  Avoid having throw rugs at the top or bottom of the stairs. If you do have throw rugs, attach them to the floor with carpet tape.  Make sure that you have a  light switch at the top of the stairs and the bottom of the stairs. If you do not have them, ask someone to add them for you. What else can I do to help prevent falls?  Wear shoes that:  Do not have high heels.  Have rubber bottoms.  Are comfortable and fit you well.  Are closed at the toe. Do not wear sandals.  If you use a stepladder:  Make sure that it is fully opened. Do not climb a closed stepladder.  Make sure that both sides of the stepladder are locked into place.  Ask someone to hold it for you, if possible.  Clearly mark and make sure that you can see:  Any grab bars or handrails.  First and last steps.  Where the edge of each step is.  Use tools that help you move around (mobility aids) if they are needed. These include:  Canes.  Walkers.  Scooters.  Crutches.  Turn on the lights when you go into a dark area. Replace any light bulbs as soon as they burn out.  Set up your furniture so you have a clear path. Avoid moving your furniture around.  If any of your floors are uneven, fix them.  If there are any pets around you, be aware of where they are.  Review your medicines with your doctor. Some medicines can make you feel dizzy. This can increase your chance of falling. Ask your doctor what other things that you can do to help prevent falls. This information is not intended to replace advice given to you by your health care provider. Make sure you discuss any questions you have with your health care provider. Document Released: 05/05/2009 Document Revised: 12/15/2015 Document Reviewed: 08/13/2014 Elsevier Interactive Patient Education  2017 [employer].

## 2019-01-26 ENCOUNTER — Other Ambulatory Visit: Payer: Self-pay

## 2019-01-26 ENCOUNTER — Ambulatory Visit (INDEPENDENT_AMBULATORY_CARE_PROVIDER_SITE_OTHER): Payer: Medicare HMO

## 2019-01-26 DIAGNOSIS — I739 Peripheral vascular disease, unspecified: Secondary | ICD-10-CM | POA: Diagnosis not present

## 2019-01-27 ENCOUNTER — Telehealth: Payer: Self-pay | Admitting: *Deleted

## 2019-01-27 ENCOUNTER — Telehealth: Payer: Self-pay

## 2019-01-27 ENCOUNTER — Encounter: Payer: Self-pay | Admitting: *Deleted

## 2019-01-27 NOTE — Telephone Encounter (Signed)
Spoke with patient and reminded to call and schedule colonoscopy as soon as he received his letter.  He agreed that he will.

## 2019-01-27 NOTE — Telephone Encounter (Signed)
Results called to pt. Pt verbalized understanding. In looking at Dr Tyrell Antonio schedule first available is not until late August. Routing to Dr Fletcher Anon and his nurse if there is anywhere to add patient on before then.  Would 01/29/19 at 12:20 be ok? Or anywhere else you can suggest?

## 2019-01-27 NOTE — Telephone Encounter (Signed)
This is really not urgent.  Late August is fine unless he is barely able to walk or has lower extremity wounds or ulcerations.

## 2019-01-27 NOTE — Telephone Encounter (Signed)
-----   Message from Theora Gianotti, NP sent at 01/26/2019  1:55 PM EDT ----- Duplex suggestive of blockages in the arteries supplying both legs, worse on the left.  This very likely explains his symptoms while walking.  He sees Dr. Fletcher Anon for his cardiac history but has not seen before for PV.  Please arrange for him to see Dr. Fletcher Anon for a PV eval.

## 2019-01-27 NOTE — Telephone Encounter (Signed)
-----   Message from Glean Hess, MD sent at 01/27/2019  3:53 PM EDT ----- Please let patient know that he will be getting a letter about his colonoscopy.  He needs to call them immediately and get his colonoscopy scheduled.

## 2019-01-28 NOTE — Telephone Encounter (Signed)
Spoke with the pt and made him aware of Dr.Arida's recommendation. Pt sts that he is agreeable. Appt scheduled for 03/10/19 @ 4:20pm. Pt aware of the appt date and time.

## 2019-02-02 ENCOUNTER — Telehealth: Payer: Self-pay | Admitting: Gastroenterology

## 2019-02-02 ENCOUNTER — Other Ambulatory Visit: Payer: Self-pay

## 2019-02-02 DIAGNOSIS — Z1211 Encounter for screening for malignant neoplasm of colon: Secondary | ICD-10-CM

## 2019-02-02 MED ORDER — NA SULFATE-K SULFATE-MG SULF 17.5-3.13-1.6 GM/177ML PO SOLN: 1.0000 | Freq: Once | ORAL | 0 refills | Status: AC

## 2019-02-02 NOTE — Telephone Encounter (Signed)
Patient called to schedule a colonoscopy,on workq

## 2019-02-02 NOTE — Telephone Encounter (Signed)
Gastroenterology Pre-Procedure Review  Request Date: 02/12/19 Requesting Physician: Dr. Allen Norris  PATIENT REVIEW QUESTIONS: The patient responded to the following health history questions as indicated:    1. Are you having any GI issues? no 2. Do you have a personal history of Polyps? no 3. Do you have a family history of Colon Cancer or Polyps? no 4. Diabetes Mellitus? no 5. Joint replacements in the past 12 months?no 6. Major health problems in the past 3 months?no 7. Any artificial heart valves, MVP, or defibrillator?no    MEDICATIONS & ALLERGIES:    Patient reports the following regarding taking any anticoagulation/antiplatelet therapy:   Plavix, Coumadin, Eliquis, Xarelto, Lovenox, Pradaxa, Brilinta, or Effient? yes (blood thinner sent to Dr. Oneal Deputy) Aspirin? yes (81 mg daily)  Patient confirms/reports the following medications:  Current Outpatient Medications  Medication Sig Dispense Refill  . aspirin EC 81 MG tablet Take 81 mg by mouth daily.     Marland Kitchen atorvastatin (LIPITOR) 80 MG tablet TAKE 1 TABLET EVERY DAY AT 6:00PM 90 tablet 1  . clopidogrel (PLAVIX) 75 MG tablet TAKE 1 TABLET EVERY DAY 90 tablet 1  . furosemide (LASIX) 20 MG tablet TAKE 1 TABLET EVERY DAY 90 tablet 1  . isosorbide mononitrate (IMDUR) 30 MG 24 hr tablet TAKE 1 TABLET EVERY DAY 90 tablet 1  . losartan (COZAAR) 25 MG tablet TAKE 1 TABLET EVERY DAY 90 tablet 1  . metoprolol succinate (TOPROL-XL) 25 MG 24 hr tablet TAKE 1/2 TABLET EVERY DAY 45 tablet 1  . nitroGLYCERIN (NITROSTAT) 0.4 MG SL tablet Place 1 tablet (0.4 mg total) under the tongue every 5 (five) minutes as needed for chest pain. 30 tablet 0  . potassium chloride (K-DUR,KLOR-CON) 10 MEQ tablet TAKE 1 TABLET EVERY DAY 90 tablet 1  . tamsulosin (FLOMAX) 0.4 MG CAPS capsule TAKE 1 CAPSULE EVERY DAY 90 capsule 1   No current facility-administered medications for this visit.     Patient confirms/reports the following allergies:  Allergies  Allergen  Reactions  . Codeine Hives  . Shellfish Allergy Hives and Swelling  . Contrast Media [Iodinated Diagnostic Agents] Itching and Other (See Comments)    Whelps on tongue    No orders of the defined types were placed in this encounter.   AUTHORIZATION INFORMATION Primary Insurance: 1D#: Group #:  Secondary Insurance: 1D#: Group #:  SCHEDULE INFORMATION: Date: 07/23/20Time: Location: [place]

## 2019-02-03 ENCOUNTER — Other Ambulatory Visit: Payer: Self-pay

## 2019-02-03 DIAGNOSIS — Z1211 Encounter for screening for malignant neoplasm of colon: Secondary | ICD-10-CM

## 2019-02-05 ENCOUNTER — Encounter: Payer: Self-pay | Admitting: *Deleted

## 2019-02-05 ENCOUNTER — Other Ambulatory Visit: Payer: Self-pay

## 2019-02-06 ENCOUNTER — Telehealth: Payer: Self-pay

## 2019-02-06 NOTE — Telephone Encounter (Signed)
Patient has been advised to stop blood thinner today (7 days before 02/12/19 colonoscopy) restart on the 25th per Dr. Gaspar Cola blood thinner advice received on 02/04/19.  Thanks [employer]

## 2019-02-09 ENCOUNTER — Other Ambulatory Visit
Admission: RE | Admit: 2019-02-09 | Discharge: 2019-02-09 | Disposition: A | Discharge status: Home / Self Care | Payer: Medicare HMO | Source: Ambulatory Visit | Attending: Gastroenterology | Admitting: Gastroenterology

## 2019-02-09 ENCOUNTER — Other Ambulatory Visit: Payer: Self-pay

## 2019-02-09 DIAGNOSIS — Z1159 Encounter for screening for other viral diseases: Secondary | ICD-10-CM | POA: Insufficient documentation

## 2019-02-10 LAB — SARS CORONAVIRUS 2 (TAT 6-24 HRS): SARS Coronavirus 2: NEGATIVE

## 2019-02-11 ENCOUNTER — Encounter: Payer: Self-pay | Admitting: *Deleted

## 2019-02-11 NOTE — Discharge Instructions (Signed)

## 2019-02-11 NOTE — Anesthesia Preprocedure Evaluation (Deleted)
Anesthesia Evaluation    Airway        Dental   Pulmonary former smoker (quit 2009),           Cardiovascular hypertension, + CAD (s/p MI, CABG, stents on Plavix)  + dysrhythmias (hx PAF in setting of MI, converted to NSR)   Ischemic cardiomyopathy  ECG 04/29/18: Sinus bradycardia (HR 54), nonspecific T wave abnormality  Myocardial perfusion 04/06/16:   There was no ST segment deviation noted during stress.  No T wave inversion was noted during stress.  This is a low risk study.  The left ventricular ejection fraction is normal (55-65%).  Defect 1: There is a small defect of mild severity present in the apex location. This is patrially reversible and suspected to be due to an artifact given intense GI uptake which affected the quality of study. Correlate clinically   Neuro/Psych CVA (2011)    GI/Hepatic   Endo/Other    Renal/GU      Musculoskeletal   Abdominal   Peds  Hematology   Anesthesia Other Findings Cardiology note 11/19/18:  Assessment & Plan   1.  Coronary artery disease: Status post inferior MI in December 2018 with RCA stenting followed by staged left main and LAD stenting in early 2019.  Last catheterization in April 2019 showed stable anatomy with a 70% ramus stenosis.  Patient has done well since his last visit in office without chest pain or dyspnea.  He is walking about 30 minutes/day.  He does experience bilateral calf claudication which is discussed below.  He remains on aspirin, Plavix, statin, nitrate, beta-blocker, ARB, and Zetia therapy.  2.  Essential hypertension: Stable.  3.  Hyperlipidemia: LDL 70 24 May 2018 and he is now on statin and Zetia therapy.  LFTs were normal at that time.  4.  Bilateral lower extremity claudication: Patient reports a several month history of bilateral calf burning and tightness with ambulation of approximately 200 to 300 yards.  This is been going on for  several months.  He has never been evaluated for this before.  I will arrange for bilateral lower extremity arterial duplex.  Continue statin and aspirin therapy.  4.  History of paroxysmal atrial fibrillation: This occurred in the setting of inferior MI.  No recurrent palpitations.  He remains on beta-blocker therapy.  5.  Morbid obesity: He has gained about 10 pounds in the past 6 weeks in the setting of being more sedentary as result of stay at home orders.  He is walking 30 minutes a day.  I did encourage him to increase his activity and reduce caloric intake.  He is drinking about 4 Bud Light's a day and I recommended that he cut back to 0-1.    6.  Disposition: Follow-up lower extremity arterial duplex.  Follow-up in clinic in 3 months or sooner if necessary.   Reproductive/Obstetrics                             Anesthesia Physical Anesthesia Plan  ASA: III  Anesthesia Plan: General   Post-op Pain Management:    Induction: Intravenous  PONV Risk Score and Plan: 2 and Propofol infusion and TIVA  Airway Management Planned: Natural Airway  Additional Equipment:   Intra-op Plan:   Post-operative Plan:   Informed Consent:   Plan Discussed with:   Anesthesia Plan Comments:         Anesthesia Quick Evaluation

## 2019-02-12 ENCOUNTER — Encounter: Payer: Self-pay | Admitting: Emergency Medicine

## 2019-02-12 ENCOUNTER — Other Ambulatory Visit: Payer: Self-pay

## 2019-02-12 ENCOUNTER — Encounter: Payer: Self-pay | Admitting: Anesthesiology

## 2019-02-12 ENCOUNTER — Encounter
Admission: RE | Disposition: A | Discharge status: Home / Self Care | Payer: Self-pay | Source: Home / Self Care | Attending: Gastroenterology

## 2019-02-12 ENCOUNTER — Ambulatory Visit
Admission: RE | Admit: 2019-02-12 | Discharge: 2019-02-12 | Disposition: A | Discharge status: Home / Self Care | Payer: Medicare HMO | Attending: Gastroenterology | Admitting: Gastroenterology

## 2019-02-12 ENCOUNTER — Ambulatory Visit: Admit: 2019-02-12 | Payer: Medicare HMO | Admitting: Gastroenterology

## 2019-02-12 DIAGNOSIS — E785 Hyperlipidemia, unspecified: Secondary | ICD-10-CM | POA: Diagnosis not present

## 2019-02-12 DIAGNOSIS — Z1211 Encounter for screening for malignant neoplasm of colon: Secondary | ICD-10-CM | POA: Insufficient documentation

## 2019-02-12 DIAGNOSIS — Z79899 Other long term (current) drug therapy: Secondary | ICD-10-CM | POA: Diagnosis not present

## 2019-02-12 DIAGNOSIS — K219 Gastro-esophageal reflux disease without esophagitis: Secondary | ICD-10-CM | POA: Diagnosis not present

## 2019-02-12 DIAGNOSIS — Z87891 Personal history of nicotine dependence: Secondary | ICD-10-CM | POA: Insufficient documentation

## 2019-02-12 DIAGNOSIS — Z8673 Personal history of transient ischemic attack (TIA), and cerebral infarction without residual deficits: Secondary | ICD-10-CM | POA: Diagnosis not present

## 2019-02-12 DIAGNOSIS — I252 Old myocardial infarction: Secondary | ICD-10-CM | POA: Insufficient documentation

## 2019-02-12 DIAGNOSIS — Z955 Presence of coronary angioplasty implant and graft: Secondary | ICD-10-CM | POA: Diagnosis not present

## 2019-02-12 DIAGNOSIS — Z7982 Long term (current) use of aspirin: Secondary | ICD-10-CM | POA: Insufficient documentation

## 2019-02-12 DIAGNOSIS — I10 Essential (primary) hypertension: Secondary | ICD-10-CM | POA: Insufficient documentation

## 2019-02-12 DIAGNOSIS — I2510 Atherosclerotic heart disease of native coronary artery without angina pectoris: Secondary | ICD-10-CM | POA: Diagnosis not present

## 2019-02-12 DIAGNOSIS — Z7902 Long term (current) use of antithrombotics/antiplatelets: Secondary | ICD-10-CM | POA: Insufficient documentation

## 2019-02-12 DIAGNOSIS — K621 Rectal polyp: Secondary | ICD-10-CM | POA: Insufficient documentation

## 2019-02-12 DIAGNOSIS — K635 Polyp of colon: Secondary | ICD-10-CM | POA: Diagnosis not present

## 2019-02-12 HISTORY — PX: COLONOSCOPY WITH PROPOFOL: SHX5780

## 2019-02-12 SURGERY — COLONOSCOPY WITH PROPOFOL
Anesthesia: General

## 2019-02-12 SURGERY — COLONOSCOPY WITH PROPOFOL
Anesthesia: Choice

## 2019-02-12 MED ORDER — SODIUM CHLORIDE 0.9 % IV SOLN
INTRAVENOUS | Status: DC
  Administered 2019-02-12: 1000 mL via INTRAVENOUS

## 2019-02-12 MED ORDER — GLYCOPYRROLATE 0.2 MG/ML IJ SOLN
INTRAMUSCULAR | Status: DC | PRN
  Administered 2019-02-12: 0.2 mg via INTRAVENOUS

## 2019-02-12 MED ORDER — PROPOFOL 500 MG/50ML IV EMUL
INTRAVENOUS | Status: DC | PRN
  Administered 2019-02-12: 150 ug/kg/min via INTRAVENOUS

## 2019-02-12 MED ORDER — PROPOFOL 10 MG/ML IV BOLUS
INTRAVENOUS | Status: DC | PRN
  Administered 2019-02-12: 50 mg via INTRAVENOUS

## 2019-02-12 NOTE — Op Note (Signed)
[facility] Gastroenterology Patient Name: Todd Weaver Procedure Date: 02/12/2019 10:34 AM MRN: [id number] Account #: [id number] Date of Birth: 1951-06-17 Admit Type: Outpatient Age: 68 Room: [facility] ENDO ROOM 4 Gender: Male Note Status: Finalized Procedure:            Colonoscopy Indications:          Screening for colorectal malignant neoplasm Providers:            Lucilla Lame MD, MD Referring MD:         Halina Maidens, MD (Referring MD) Medicines:            Propofol per Anesthesia Complications:        No immediate complications. Procedure:            Pre-Anesthesia Assessment:                       - Prior to the procedure, a History and Physical was                        performed, and patient medications and allergies were                        reviewed. The patient's tolerance of previous                        anesthesia was also reviewed. The risks and benefits of                        the procedure and the sedation options and risks were                        discussed with the patient. All questions were                        answered, and informed consent was obtained. Prior                        Anticoagulants: The patient has taken no previous                        anticoagulant or antiplatelet agents. ASA Grade                        Assessment: II - A patient with mild systemic disease.                        After reviewing the risks and benefits, the patient was                        deemed in satisfactory condition to undergo the                        procedure.                       After obtaining informed consent, the colonoscope was                        passed under direct vision. Throughout the procedure,  the patient's blood pressure, pulse, and oxygen                        saturations were monitored continuously. The                        Colonoscope was introduced through the anus and        advanced to the the cecum, identified by appendiceal                        orifice and ileocecal valve. The colonoscopy was                        performed without difficulty. The patient tolerated the                        procedure well. The quality of the bowel preparation                        was excellent. Findings:      The perianal and digital rectal examinations were normal.      Three sessile polyps were found in the rectum. The polyps were 2 to 3 mm       in size. These polyps were removed with a cold biopsy forceps. Resection       and retrieval were complete.      The terminal ileum appeared normal. Impression:           - Three 2 to 3 mm polyps in the rectum, removed with a                        cold biopsy forceps. Resected and retrieved. Recommendation:       - Discharge patient to home.                       - Resume previous diet.                       - Continue present medications.                       - Await pathology results.                       - Repeat colonoscopy in 5 years if polyp adenoma and 10                        years if hyperplastic Procedure Code(s):    --- Professional ---                       [phone], Colonoscopy, flexible; with biopsy, single or                        multiple Diagnosis Code(s):    --- Professional ---                       Z12.11, Encounter for screening for malignant neoplasm                        of colon  K62.1, Rectal polyp CPT copyright 2019 American Medical Association. All rights reserved. The codes documented in this report are preliminary and upon coder review may  be revised to meet current compliance requirements. Lucilla Lame MD, MD 02/12/2019 11:00:46 AM This report has been signed electronically. Number of Addenda: 0 Note Initiated On: 02/12/2019 10:34 AM Scope Withdrawal Time: 0 hours 7 minutes 58 seconds  Total Procedure Duration: 0 hours 9 minutes 48 seconds  Estimated Blood  Loss: Estimated blood loss: none.      [facility]

## 2019-02-12 NOTE — Anesthesia Preprocedure Evaluation (Signed)
Anesthesia Evaluation  Patient identified by MRN, date of birth, ID band Patient awake    Reviewed: Allergy & Precautions, NPO status , Patient's Chart, lab work & pertinent test results, reviewed documented beta blocker date and time   History of Anesthesia Complications Negative for: history of anesthetic complications  Airway Mallampati: II  TM Distance: >3 FB Neck ROM: Full    Dental  (+) Missing, Dental Advidsory Given,    Pulmonary neg pulmonary ROS, former smoker,    Pulmonary exam normal        Cardiovascular Exercise Tolerance: Good hypertension, Pt. on medications and Pt. on home beta blockers (-) angina+ CAD, + Past MI and + Cardiac Stents  (-) CABG Normal cardiovascular exam+ dysrhythmias Atrial Fibrillation (-) Valvular Problems/Murmurs     Neuro/Psych neg Seizures CVA, No Residual Symptoms negative psych ROS   GI/Hepatic Neg liver ROS, GERD  Controlled,  Endo/Other  negative endocrine ROS  Renal/GU negative Renal ROS  negative genitourinary   Musculoskeletal negative musculoskeletal ROS (+)   Abdominal   Peds negative pediatric ROS (+)  Hematology negative hematology ROS (+)   Anesthesia Other Findings Past Medical History: 07/18/2017: Acute ST elevation myocardial infarction (STEMI) of  inferior wall (HCC) No date: CAD (coronary artery disease)     Comment:  a. 03/2016 MV: EF 55-65%, low risk; b. 06/2017 Inf STEMI:              LM 85d, LAD 60p, 12m, LCX 40p, RCA 99ost (3.25x18 Xience               [place] DES/3.0x38 [place] DES), RPAV 60; c. 07/2017 Cath:               LM 85d, LAD 85ost, 60p, 69m, RI 50ost, LCX 50p, RCA               patent stent, RPAD/RPAV min irregs; c. 08/2017 PCI dLM/LAD              (3.0x38 Xience [place] DES); d. 10/2017 Cath: LM/prox LAD               patent, LAD 29m, 40d, RI 70, LCX 40p, RCA patent stent.               EF 50-55 No date: Contrast media allergy No date: GERD  (gastroesophageal reflux disease) No date: Hemorrhoid No date: Hyperlipidemia No date: Hypertension No date: Ischemic cardiomyopathy     Comment:  a. 06/2017 Echo: EF 45-50%, mod inf/infsept HK. Mildly               dil LA. No date: PAF (paroxysmal atrial fibrillation) ([place])     Comment:  a. Brief episode during December               hospitalization-->converted on IV amio-->not on [facility]. 2011: Stroke [facility])     Comment:  "2 MINI STROKES". no deficits. No date: Unstable angina (HCC)   Reproductive/Obstetrics negative OB ROS                             Anesthesia Physical  Anesthesia Plan  ASA: III  Anesthesia Plan: General   Post-op Pain Management:    Induction: Intravenous  PONV Risk Score and Plan: 2 and Propofol infusion and TIVA  Airway Management Planned: Nasal Cannula and Natural Airway  Additional Equipment:   Intra-op Plan:   Post-operative Plan:   Informed Consent: I have reviewed  the patients History and Physical, chart, labs and discussed the procedure including the risks, benefits and alternatives for the proposed anesthesia with the patient or authorized representative who has indicated his/her understanding and acceptance.     Dental advisory given  Plan Discussed with: CRNA and Surgeon  Anesthesia Plan Comments:         Anesthesia Quick Evaluation

## 2019-02-12 NOTE — Anesthesia Procedure Notes (Signed)
Date/Time: 02/12/2019 10:52 AM Performed by: Nelda Marseille, CRNA Pre-anesthesia Checklist: Patient identified, Emergency Drugs available, Suction available, Patient being monitored and Timeout performed Oxygen Delivery Method: Nasal cannula

## 2019-02-12 NOTE — H&P (Signed)
Todd Lame, MD [facility] [address], [place], [place] [address] Phone: [phone] Fax : [phone]  Primary Care Physician:  Glean Hess, MD Primary Gastroenterologist:  Dr. Allen Norris  Pre-Procedure History & Physical: HPI:  Todd Weaver is a 68 y.o. male is here for a screening colonoscopy.   Past Medical History:  Diagnosis Date  . Acute ST elevation myocardial infarction (STEMI) of inferior wall ([place]) 07/18/2017  . CAD (coronary artery disease)    a. 03/2016 MV: EF 55-65%, low risk; b. 06/2017 Inf STEMI: LM 85d, LAD 60p, 32m, LCX 40p, RCA 99ost (3.25x18 Xience [place] DES/3.0x38 [place] DES), RPAV 60; c. 07/2017 Cath: LM 85d, LAD 85ost, 60p, 26m, RI 50ost, LCX 50p, RCA patent stent, RPAD/RPAV min irregs; c. 08/2017 PCI dLM/LAD (3.0x38 Xience [place] DES); d. 10/2017 Cath: LM/prox LAD patent, LAD 79m, 40d, RI 70, LCX 40p, RCA patent stent. EF 50-55  . Contrast media allergy   . GERD (gastroesophageal reflux disease)   . Hemorrhoid   . Hyperlipidemia   . Hypertension   . Ischemic cardiomyopathy    a. 06/2017 Echo: EF 45-50%, mod inf/infsept HK. Mildly dil LA.  Marland Kitchen PAF (paroxysmal atrial fibrillation) ([place])    a. Brief episode during December hospitalization-->converted on IV amio-->not on [place].  Marland Kitchen Stroke [facility]) 2011   "2 MINI STROKES". no deficits.  Marland Kitchen Unstable angina [facility])     Past Surgical History:  Procedure Laterality Date  . ANAL FISTULECTOMY N/A 12/21/2014   Procedure: FISTULECTOMY ANAL;  Surgeon: Christene Lye, MD;  Location: [facility] ORS;  Service: General;  Laterality: N/A;  . APPENDECTOMY  1975  . CORONARY STENT INTERVENTION N/A 09/09/2017   Procedure: CORONARY STENT INTERVENTION;  Surgeon: Wellington Hampshire, MD;  Location: [place] CV LAB;  Service: Cardiovascular;  Laterality: N/A;  . CORONARY/GRAFT ACUTE MI REVASCULARIZATION N/A 07/18/2017   Procedure: Coronary/Graft Acute MI Revascularization;  Surgeon: Wellington Hampshire, MD;  Location: [place]  CV LAB;  Service: Cardiovascular;  Laterality: N/A;  . HEMORRHOID SURGERY N/A 12/21/2014   Procedure: HEMORRHOIDECTOMY;  Surgeon: Christene Lye, MD;  Location: [facility] ORS;  Service: General;  Laterality: N/A;  . INTRAVASCULAR ULTRASOUND/IVUS N/A 09/09/2017   Procedure: Intravascular Ultrasound/IVUS;  Surgeon: Wellington Hampshire, MD;  Location: [place] CV LAB;  Service: Cardiovascular;  Laterality: N/A;  . KNEE SURGERY Right age 30  . LEFT HEART CATH AND CORONARY ANGIOGRAPHY N/A 07/18/2017   Procedure: LEFT HEART CATH AND CORONARY ANGIOGRAPHY;  Surgeon: Wellington Hampshire, MD;  Location: [place] CV LAB;  Service: Cardiovascular;  Laterality: N/A;  . LEFT HEART CATH AND CORONARY ANGIOGRAPHY Left 08/19/2017   Procedure: LEFT HEART CATH AND CORONARY ANGIOGRAPHY;  Surgeon: Wellington Hampshire, MD;  Location: [place] CV LAB;  Service: Cardiovascular;  Laterality: Left;  . LEFT HEART CATH AND CORONARY ANGIOGRAPHY N/A 11/07/2017   Procedure: LEFT HEART CATH AND CORONARY ANGIOGRAPHY;  Surgeon: Wellington Hampshire, MD;  Location: [place] CV LAB;  Service: Cardiovascular;  Laterality: N/A;  . ROTATOR CUFF REPAIR Right 2013    Prior to Admission medications   Medication Sig Start Date End Date Taking? Authorizing Provider  aspirin EC 81 MG tablet Take 81 mg by mouth daily.  05/23/09  Yes [provider]  atorvastatin (LIPITOR) 80 MG tablet TAKE 1 TABLET EVERY DAY AT 6:00PM 10/13/18  Yes Glean Hess, MD  clopidogrel (PLAVIX) 75 MG tablet TAKE 1 TABLET EVERY DAY 10/13/18  Yes Glean Hess, MD  furosemide (LASIX) 20 MG tablet TAKE 1 TABLET EVERY DAY 10/13/18  Yes Glean Hess, MD  isosorbide mononitrate (IMDUR) 30 MG 24 hr tablet TAKE 1 TABLET EVERY DAY 10/13/18  Yes Glean Hess, MD  losartan (COZAAR) 25 MG tablet TAKE 1 TABLET EVERY DAY 10/13/18  Yes Glean Hess, MD  metoprolol succinate (TOPROL-XL) 25 MG 24 hr tablet TAKE 1/2 TABLET EVERY DAY 10/13/18  Yes  Glean Hess, MD  nitroGLYCERIN (NITROSTAT) 0.4 MG SL tablet Place 1 tablet (0.4 mg total) under the tongue every 5 (five) minutes as needed for chest pain. 10/03/18  Yes Glean Hess, MD  potassium chloride (K-DUR,KLOR-CON) 10 MEQ tablet TAKE 1 TABLET EVERY DAY 10/13/18  Yes Glean Hess, MD  tamsulosin (FLOMAX) 0.4 MG CAPS capsule TAKE 1 CAPSULE EVERY DAY 10/13/18  Yes Glean Hess, MD    Allergies as of 02/03/2019 - Review Complete 01/27/2019  Allergen Reaction Noted  . Codeine Hives   . Shellfish allergy Hives and Swelling 11/08/2014  . Contrast media [iodinated diagnostic agents] Itching and Other (See Comments) 08/22/2017    Family History  Problem Relation Age of Onset  . Hypertension Mother   . Alzheimer's disease Mother   . Hypertension Father   . Alzheimer's disease Father     Social History   Socioeconomic History  . Marital status: Married    Spouse name: Not on file  . Number of children: 2  . Years of education: Not on file  . Highest education level: 11th grade  Occupational History  . Occupation: Retired  Scientific laboratory technician  . Financial resource strain: Not hard at all  . Food insecurity    Worry: Never true    Inability: Never true  . Transportation needs    Medical: No    Non-medical: No  Tobacco Use  . Smoking status: Former Smoker    Types: Cigarettes    Quit date: 07/24/2007    Years since quitting: 11.5  . Smokeless tobacco: Never Used  . Tobacco comment: smoking cessation materials not required  Substance and Sexual Activity  . Alcohol use: Yes    Alcohol/week: 18.0 standard drinks    Types: 18 Cans of beer per week    Comment:    . Drug use: No  . Sexual activity: Not Currently  Lifestyle  . Physical activity    Days per week: 3 days    Minutes per session: 60 min  . Stress: Only a little  Relationships  . Social connections    Talks on phone: More than three times a week    Gets together: Three times a week    Attends  religious service: More than 4 times per year    Active member of club or organization: No    Attends meetings of clubs or organizations: Never    Relationship status: Married  . Intimate partner violence    Fear of current or ex partner: No    Emotionally abused: No    Physically abused: No    Forced sexual activity: No  Other Topics Concern  . Not on file  Social History Narrative   Lives in [place]. Retired.  Completed cardiac rehab program.    Review of Systems: See HPI, otherwise negative ROS  Physical Exam: BP (!) 153/68   Pulse (!) 50   Temp (!) 96.5 F (35.8 C) (Tympanic)   Resp 17   Ht 5\' 7"  (1.702 m)   Wt 82.6 kg  SpO2 98%   BMI 28.51 kg/m  General:   Alert,  pleasant and cooperative in NAD Head:  Normocephalic and atraumatic. Neck:  Supple; no masses or thyromegaly. Lungs:  Clear throughout to auscultation.    Heart:  Regular rate and rhythm. Abdomen:  Soft, nontender and nondistended. Normal bowel sounds, without guarding, and without rebound.   Neurologic:  Alert and  oriented x4;  grossly normal neurologically.  Impression/Plan: Todd Weaver is now here to undergo a screening colonoscopy.  Risks, benefits, and alternatives regarding colonoscopy have been reviewed with the patient.  Questions have been answered.  All parties agreeable.

## 2019-02-12 NOTE — Transfer of Care (Signed)
Immediate Anesthesia Transfer of Care Note  Patient: Todd Weaver  Procedure(s) Performed: COLONOSCOPY WITH PROPOFOL (N/A )  Patient Location: PACU  Anesthesia Type:General  Level of Consciousness: sedated  Airway & Oxygen Therapy: Patient Spontanous Breathing and Patient connected to nasal cannula oxygen  Post-op Assessment: Report given to RN and Post -op Vital signs reviewed and stable  Post vital signs: Reviewed and stable  Last Vitals:  Vitals Value Taken Time  BP    Temp    Pulse    Resp    SpO2      Last Pain:  Vitals:   02/12/19 0935  TempSrc: Tympanic  PainSc: 0-No pain         Complications: No apparent anesthesia complications

## 2019-02-12 NOTE — Anesthesia Postprocedure Evaluation (Signed)
Anesthesia Post Note  Patient: Todd Weaver  Procedure(s) Performed: COLONOSCOPY WITH PROPOFOL (N/A )  Patient location during evaluation: Endoscopy Anesthesia Type: General Level of consciousness: awake and alert and oriented Pain management: pain level controlled Vital Signs Assessment: post-procedure vital signs reviewed and stable Respiratory status: spontaneous breathing Cardiovascular status: blood pressure returned to baseline Anesthetic complications: no     Last Vitals:  Vitals:   02/12/19 1106 02/12/19 1130  BP: 127/80 (!) 163/93  Pulse: (!) 56 63  Resp: 17 18  Temp: (!) 36 C   SpO2: 98% 98%    Last Pain:  Vitals:   02/12/19 0935  TempSrc: Tympanic  PainSc: 0-No pain                 Berta Denson

## 2019-02-12 NOTE — Anesthesia Post-op Follow-up Note (Signed)
Anesthesia QCDR form completed.        

## 2019-02-13 ENCOUNTER — Encounter: Payer: Self-pay | Admitting: Gastroenterology

## 2019-02-16 ENCOUNTER — Encounter: Payer: Self-pay | Admitting: Gastroenterology

## 2019-02-16 LAB — SURGICAL PATHOLOGY

## 2019-02-25 ENCOUNTER — Other Ambulatory Visit: Payer: Self-pay

## 2019-02-25 DIAGNOSIS — I25118 Atherosclerotic heart disease of native coronary artery with other forms of angina pectoris: Secondary | ICD-10-CM

## 2019-02-25 MED ORDER — POTASSIUM CHLORIDE CRYS ER 10 MEQ PO TBCR: 10.0000 meq | EXTENDED_RELEASE_TABLET | Freq: Every day | ORAL | 1 refills | Status: DC

## 2019-02-25 MED ORDER — FUROSEMIDE 20 MG PO TABS: 20.0000 mg | ORAL_TABLET | Freq: Every day | ORAL | 1 refills | Status: DC

## 2019-02-25 MED ORDER — LOSARTAN POTASSIUM 25 MG PO TABS: 25.0000 mg | ORAL_TABLET | Freq: Every day | ORAL | 1 refills | Status: DC

## 2019-02-25 MED ORDER — NITROGLYCERIN 0.4 MG SL SUBL: 0.4000 mg | SUBLINGUAL_TABLET | SUBLINGUAL | 0 refills | Status: DC | PRN

## 2019-02-25 MED ORDER — TAMSULOSIN HCL 0.4 MG PO CAPS: 0.4000 mg | ORAL_CAPSULE | Freq: Every day | ORAL | 1 refills | Status: DC

## 2019-02-25 MED ORDER — CLOPIDOGREL BISULFATE 75 MG PO TABS: 75.0000 mg | ORAL_TABLET | Freq: Every day | ORAL | 1 refills | Status: DC

## 2019-02-25 MED ORDER — ISOSORBIDE MONONITRATE ER 30 MG PO TB24: 30.0000 mg | ORAL_TABLET | Freq: Every day | ORAL | 1 refills | Status: DC

## 2019-02-25 MED ORDER — ATORVASTATIN CALCIUM 80 MG PO TABS: ORAL_TABLET | ORAL | 1 refills | Status: DC

## 2019-02-25 MED ORDER — METOPROLOL SUCCINATE ER 25 MG PO TB24: 12.5000 mg | ORAL_TABLET | Freq: Every day | ORAL | 1 refills | Status: DC

## 2019-03-10 ENCOUNTER — Other Ambulatory Visit: Payer: Self-pay

## 2019-03-10 ENCOUNTER — Ambulatory Visit (INDEPENDENT_AMBULATORY_CARE_PROVIDER_SITE_OTHER): Payer: Medicare HMO | Admitting: Cardiovascular Disease

## 2019-03-10 ENCOUNTER — Encounter: Payer: Self-pay | Admitting: Cardiovascular Disease

## 2019-03-10 VITALS — BP 142/80 | HR 62 | Ht 67.0 in | Wt 187.0 lb

## 2019-03-10 DIAGNOSIS — I739 Peripheral vascular disease, unspecified: Secondary | ICD-10-CM | POA: Diagnosis not present

## 2019-03-10 DIAGNOSIS — I10 Essential (primary) hypertension: Secondary | ICD-10-CM

## 2019-03-10 DIAGNOSIS — I25118 Atherosclerotic heart disease of native coronary artery with other forms of angina pectoris: Secondary | ICD-10-CM | POA: Diagnosis not present

## 2019-03-10 DIAGNOSIS — E785 Hyperlipidemia, unspecified: Secondary | ICD-10-CM | POA: Diagnosis not present

## 2019-03-10 DIAGNOSIS — I5032 Chronic diastolic (congestive) heart failure: Secondary | ICD-10-CM | POA: Diagnosis not present

## 2019-03-10 NOTE — Patient Instructions (Signed)
Medication Instructions:  Your physician recommends that you continue on your current medications as directed. Please refer to the Current Medication list given to you today.  If you need a refill on your cardiac medications before your next appointment, please call your pharmacy.   Lab work: None ordered If you have labs (blood work) drawn today and your tests are completely normal, you will receive your results only by: . MyChart Message (if you have MyChart) OR . A paper copy in the mail If you have any lab test that is abnormal or we need to change your treatment, we will call you to review the results.  Testing/Procedures: None ordered  Follow-Up: At [facility], you and your health needs are our priority.  As part of our continuing mission to provide you with exceptional heart care, we have created designated Provider Care Teams.  These Care Teams include your primary Cardiologist (physician) and Advanced Practice Providers (APPs -  Physician Assistants and Nurse Practitioners) who all work together to provide you with the care you need, when you need it. You will need a follow up appointment in 6 months.  Please call our office 2 months in advance to schedule this appointment.  You may see Muhammad Arida, MD or one of the following Advanced Practice Providers on your designated Care Team:   Christopher Berge, NP Ryan Dunn, PA-C . Jacquelyn Visser, PA-C  Any Other Special Instructions Will Be Listed Below (If Applicable). N/A   

## 2019-03-10 NOTE — Progress Notes (Signed)
Cardiology Office Note   Date:  03/10/2019   ID:  Todd [place], DOB 1951/01/12, MRN [id number]  PCP:  Glean Hess, MD  Cardiologist:   Kathlyn Sacramento, MD   Chief Complaint  Patient presents with  . other    F/u after PV study. Medications reviewed verbally.       History of Present Illness: Todd [place] is a 68 y.o. male who presents for a follow-up visit regarding coronary artery disease.  He had inferior ST elevation myocardial infarction complicated by ventricular fibrillation arrest in December 2018 which was treated successfully with PCI and 2 overlapped drug-eluting stent placement to the right coronary artery.  He was also found to have significant ostial LAD stenosis, borderline significant proximal LAD stenosis and moderate distal LAD stenosis.  Ejection fraction was mildly reduced.  He had atrial fibrillation after the procedure that was treated successfully with amiodarone. I proceeded with IVUS guided drug-eluting stent placement to the proximal and ostial LAD with one long stent in February 2019. The patient had recurrent chest pain in April.  I repeated cardiac catheterization which showed widely patent LAD and RCA stent with no significant restenosis.  The ramus branch was pinched by the LAD stent with 70% ostial stenosis.  EF was 50 to 55%.  He has other chronic medical conditions that include previous stroke, hyperlipidemia, previous tobacco use and BPH.  He recently complained of burning sensation in both legs with walking and thus he was referred for further vascular testing.  ABI was moderately reduced in the 0.7 range bilaterally with diffuse moderate SFA disease worse on the left side.  He is able to walk 1 mile daily for exercise.  Usually he gets mild burning sensation but he does not have to stop and rest.  He has no rest pain or lower extremity ulceration.    Past Medical History:  Diagnosis Date  . Acute ST elevation myocardial infarction (STEMI)  of inferior wall ([place]) 07/18/2017  . CAD (coronary artery disease)    a. 03/2016 MV: EF 55-65%, low risk; b. 06/2017 Inf STEMI: LM 85d, LAD 60p, 7m, LCX 40p, RCA 99ost (3.25x18 Xience [place] DES/3.0x38 [place] DES), RPAV 60; c. 07/2017 Cath: LM 85d, LAD 85ost, 60p, 58m, RI 50ost, LCX 50p, RCA patent stent, RPAD/RPAV min irregs; c. 08/2017 PCI dLM/LAD (3.0x38 Xience [place] DES); d. 10/2017 Cath: LM/prox LAD patent, LAD 16m, 40d, RI 70, LCX 40p, RCA patent stent. EF 50-55  . Contrast media allergy   . GERD (gastroesophageal reflux disease)   . Hemorrhoid   . Hyperlipidemia   . Hypertension   . Ischemic cardiomyopathy    a. 06/2017 Echo: EF 45-50%, mod inf/infsept HK. Mildly dil LA.  Marland Kitchen PAF (paroxysmal atrial fibrillation) ([place])    a. Brief episode during December hospitalization-->converted on IV amio-->not on [place].  Marland Kitchen Stroke [facility]) 2011   "2 MINI STROKES". no deficits.  Marland Kitchen Unstable angina [facility])     Past Surgical History:  Procedure Laterality Date  . ANAL FISTULECTOMY N/A 12/21/2014   Procedure: FISTULECTOMY ANAL;  Surgeon: Christene Lye, MD;  Location: [facility] ORS;  Service: General;  Laterality: N/A;  . APPENDECTOMY  1975  . COLONOSCOPY WITH PROPOFOL N/A 02/12/2019   Procedure: COLONOSCOPY WITH PROPOFOL;  Surgeon: Lucilla Lame, MD;  Location: [facility] ENDOSCOPY;  Service: Endoscopy;  Laterality: N/A;  . CORONARY STENT INTERVENTION N/A 09/09/2017   Procedure: CORONARY STENT INTERVENTION;  Surgeon: Wellington Hampshire, MD;  Location: [place] CV  LAB;  Service: Cardiovascular;  Laterality: N/A;  . CORONARY/GRAFT ACUTE MI REVASCULARIZATION N/A 07/18/2017   Procedure: Coronary/Graft Acute MI Revascularization;  Surgeon: Wellington Hampshire, MD;  Location: [place] CV LAB;  Service: Cardiovascular;  Laterality: N/A;  . HEMORRHOID SURGERY N/A 12/21/2014   Procedure: HEMORRHOIDECTOMY;  Surgeon: Christene Lye, MD;  Location: [facility] ORS;  Service: General;  Laterality: N/A;  . INTRAVASCULAR  ULTRASOUND/IVUS N/A 09/09/2017   Procedure: Intravascular Ultrasound/IVUS;  Surgeon: Wellington Hampshire, MD;  Location: [place] CV LAB;  Service: Cardiovascular;  Laterality: N/A;  . KNEE SURGERY Right age 45  . LEFT HEART CATH AND CORONARY ANGIOGRAPHY N/A 07/18/2017   Procedure: LEFT HEART CATH AND CORONARY ANGIOGRAPHY;  Surgeon: Wellington Hampshire, MD;  Location: [place] CV LAB;  Service: Cardiovascular;  Laterality: N/A;  . LEFT HEART CATH AND CORONARY ANGIOGRAPHY Left 08/19/2017   Procedure: LEFT HEART CATH AND CORONARY ANGIOGRAPHY;  Surgeon: Wellington Hampshire, MD;  Location: [place] CV LAB;  Service: Cardiovascular;  Laterality: Left;  . LEFT HEART CATH AND CORONARY ANGIOGRAPHY N/A 11/07/2017   Procedure: LEFT HEART CATH AND CORONARY ANGIOGRAPHY;  Surgeon: Wellington Hampshire, MD;  Location: [place] CV LAB;  Service: Cardiovascular;  Laterality: N/A;  . ROTATOR CUFF REPAIR Right 2013     Current Outpatient Medications  Medication Sig Dispense Refill  . aspirin EC 81 MG tablet Take 81 mg by mouth daily.     Marland Kitchen atorvastatin (LIPITOR) 80 MG tablet TAKE 1 TABLET EVERY DAY AT 6:00PM 90 tablet 1  . clopidogrel (PLAVIX) 75 MG tablet Take 1 tablet (75 mg total) by mouth daily. 90 tablet 1  . furosemide (LASIX) 20 MG tablet Take 1 tablet (20 mg total) by mouth daily. 90 tablet 1  . isosorbide mononitrate (IMDUR) 30 MG 24 hr tablet Take 1 tablet (30 mg total) by mouth daily. 90 tablet 1  . losartan (COZAAR) 25 MG tablet Take 1 tablet (25 mg total) by mouth daily. 90 tablet 1  . metoprolol succinate (TOPROL-XL) 25 MG 24 hr tablet Take 0.5 tablets (12.5 mg total) by mouth daily. 45 tablet 1  . nitroGLYCERIN (NITROSTAT) 0.4 MG SL tablet Place 1 tablet (0.4 mg total) under the tongue every 5 (five) minutes as needed for chest pain. 30 tablet 0  . potassium chloride (K-DUR) 10 MEQ tablet Take 1 tablet (10 mEq total) by mouth daily. 90 tablet 1  . tamsulosin (FLOMAX) 0.4 MG CAPS capsule  Take 1 capsule (0.4 mg total) by mouth daily. 90 capsule 1   No current facility-administered medications for this visit.     Allergies:   Codeine, Shellfish allergy, Contrast media [iodinated diagnostic agents], and Fish allergy    Social History:  The patient  reports that he quit smoking about 11 years ago. His smoking use included cigarettes. He has never used smokeless tobacco. He reports current alcohol use of about 18.0 standard drinks of alcohol per week. He reports that he does not use drugs.   Family History:  The patient's family history includes Alzheimer's disease in his father and mother; Hypertension in his father and mother.    ROS:  Please see the history of present illness.   Otherwise, review of systems are positive for none.   All other systems are reviewed and negative.    PHYSICAL EXAM: VS:  BP (!) 142/80 (BP Location: Left Arm, Patient Position: Sitting, Cuff Size: Normal)   Pulse 62   Ht 5\' 7"  (1.702 m)  Wt 187 lb (84.8 kg)   SpO2 98%   BMI 29.29 kg/m  , BMI Body mass index is 29.29 kg/m. GEN: Well nourished, well developed, in no acute distress  HEENT: normal  Neck: no JVD, carotid bruits, or masses Cardiac: RRR; no murmurs, rubs, or gallops,no edema  Respiratory:  clear to auscultation bilaterally, normal work of breathing GI: soft, nontender, nondistended, + BS MS: no deformity or atrophy  Skin: warm and dry, no rash Neuro:  Strength and sensation are intact Psych: euthymic mood, full affect   EKG:  EKG is ordered today. The ekg ordered today demonstrates normal sinus rhythm with no significant ST or T wave changes.   Recent Labs: 03/28/2018: BUN 14; Creatinine, Ser 1.06; Hemoglobin 14.7; Platelets 297; Potassium 4.3; Sodium 141 06/09/2018: ALT 22    Lipid Panel    Component Value Date/Time   CHOL 131 06/09/2018 0826   TRIG 52 06/09/2018 0826   HDL 49 06/09/2018 0826   CHOLHDL 2.7 06/09/2018 0826   CHOLHDL 2.8 11/07/2017 0511   VLDL 4  11/07/2017 0511   LDLCALC 72 06/09/2018 0826   LDLDIRECT 116 (H) 09/24/2017 1338      Wt Readings from Last 3 Encounters:  03/10/19 187 lb (84.8 kg)  02/12/19 182 lb (82.6 kg)  12/03/18 182 lb (82.6 kg)       PAD Screen 03/13/2016  Previous PAD dx? No  Previous surgical procedure? No  Pain with walking? No  Feet/toe relief with dangling? Yes  Painful, non-healing ulcers? No  Extremities discolored? No      ASSESSMENT AND PLAN:  1.  Peripheral arterial disease: Mild bilateral calf claudication.  His symptoms are not lifestyle limiting at the present time and he continues to walk 1 mile daily without having to stop.  I discussed with him the natural history and management of claudication.  Given that his symptoms are not severe, I recommend continuing medical therapy.  I advised him to continue with his walking.  He is already on good medical therapy for his coronary artery disease.  2. Coronary artery disease involving native coronary arteries without angina: He is doing extremely well with no anginal symptoms.  Continue medical therapy.  Continue long-term dual antiplatelet therapy as tolerated.    3.  Chronic systolic heart failure: He appears to be euvolemic.  Most recent ejection fraction was 50 to 55%.  4.  Hyperlipidemia: Continue high-dose atorvastatin.    5.  Essential hypertension: Blood pressure is reasonably controlled.    Disposition:   FU with me in 6 months  Signed,  Kathlyn Sacramento, MD  03/10/2019 5:21 PM    [place]

## 2019-03-31 ENCOUNTER — Encounter: Payer: Self-pay | Admitting: Internal Medicine

## 2019-03-31 ENCOUNTER — Other Ambulatory Visit: Payer: Self-pay

## 2019-03-31 ENCOUNTER — Ambulatory Visit (INDEPENDENT_AMBULATORY_CARE_PROVIDER_SITE_OTHER): Payer: Medicare HMO | Admitting: Internal Medicine

## 2019-03-31 VITALS — BP 122/64 | HR 51 | Ht 67.0 in | Wt 186.0 lb

## 2019-03-31 DIAGNOSIS — E785 Hyperlipidemia, unspecified: Secondary | ICD-10-CM | POA: Diagnosis not present

## 2019-03-31 DIAGNOSIS — M65331 Trigger finger, right middle finger: Secondary | ICD-10-CM | POA: Diagnosis not present

## 2019-03-31 DIAGNOSIS — R351 Nocturia: Secondary | ICD-10-CM

## 2019-03-31 DIAGNOSIS — Z0000 Encounter for general adult medical examination without abnormal findings: Secondary | ICD-10-CM

## 2019-03-31 DIAGNOSIS — D485 Neoplasm of uncertain behavior of skin: Secondary | ICD-10-CM | POA: Diagnosis not present

## 2019-03-31 DIAGNOSIS — I739 Peripheral vascular disease, unspecified: Secondary | ICD-10-CM | POA: Diagnosis not present

## 2019-03-31 DIAGNOSIS — Z23 Encounter for immunization: Secondary | ICD-10-CM

## 2019-03-31 DIAGNOSIS — I10 Essential (primary) hypertension: Secondary | ICD-10-CM | POA: Diagnosis not present

## 2019-03-31 LAB — POCT URINALYSIS DIPSTICK
Bilirubin, UA: NEGATIVE
Blood, UA: NEGATIVE
Glucose, UA: NEGATIVE
Ketones, UA: NEGATIVE
Leukocytes, UA: NEGATIVE
Nitrite, UA: NEGATIVE
Protein, UA: NEGATIVE
Spec Grav, UA: 1.01 (ref 1.010–1.025)
Urobilinogen, UA: 0.2 E.U./dL
pH, UA: 5 (ref 5.0–8.0)

## 2019-03-31 NOTE — Progress Notes (Signed)
Date:  03/31/2019   Name:  Todd [place]   DOB:  January 04, 1951   MRN:  [id number]   Chief Complaint: Annual Exam (High dose flu shot.) and Hypertension Todd Weaver is a 68 y.o. male who presents today for his Complete Annual Exam. He feels well. He reports exercising walking 1.5 mi/day. He reports he is sleeping poorly - he takes a long time to fall asleep and is interrupted by nocturia x 3.  He drinks about 4 beers per day.  Colonoscopy  01/2019 Prevnar-13  03/2018 PPV-23 due  Hypertension This is a chronic problem. The problem is controlled. Pertinent negatives include no chest pain, headaches, palpitations or shortness of breath. Hypertensive end-organ damage includes CAD/MI.  Hyperlipidemia Pertinent negatives include no chest pain, myalgias (mild claudication sx) or shortness of breath.  PAD - he was seen with reduced ABI and minor symptoms of claudication.  He was advised by cardiology to continue his exercise program and medications without surgical intervention at this time. Arterial studies suggested ~75% stenosis SFA bilaterally.  Review of Systems  Constitutional: Negative for appetite change, chills, diaphoresis, fatigue and unexpected weight change.  HENT: Negative for hearing loss, tinnitus, trouble swallowing and voice change.   Eyes: Negative for visual disturbance.  Respiratory: Negative for choking, shortness of breath and wheezing.   Cardiovascular: Negative for chest pain, palpitations and leg swelling.  Gastrointestinal: Negative for abdominal pain, blood in stool, constipation and diarrhea.  Genitourinary: Negative for difficulty urinating, dysuria and frequency.       Nocturia x 3  Musculoskeletal: Negative for arthralgias, back pain and myalgias (mild claudication sx).  Skin: Negative for color change and rash.  Allergic/Immunologic: Negative for environmental allergies.  Neurological: Negative for dizziness, syncope and headaches.  Hematological: Negative for  adenopathy.  Psychiatric/Behavioral: Positive for sleep disturbance. Negative for dysphoric mood. The patient is not nervous/anxious.     Patient Active Problem List   Diagnosis Date Noted  . Encounter for screening colonoscopy   . Rectal polyp   . Pain in joint of left shoulder 10/14/2018  . Impingement syndrome of left shoulder region 10/14/2018  . Coronary artery disease of native artery of native heart with stable angina pectoris ([place]) 10/03/2018  . Chronic left shoulder pain 10/03/2018  . Daytime sleepiness 09/23/2017  . History of ST elevation myocardial infarction (STEMI) 07/18/2017  . Hypertension 06/19/2017  . Hyperlipidemia 06/19/2017  . History of CVA (cerebrovascular accident) 06/19/2017  . Overweight (BMI 25.0-29.9) 06/19/2017  . BPH (benign prostatic hyperplasia) 06/19/2017  . ED (erectile dysfunction) 06/19/2017  . MCI (mild cognitive impairment) 06/19/2017  . FH: Alzheimer's disease 06/19/2017  . Lumbar spondylosis with myelopathy 11/08/2016  . Hand dermatitis 07/26/2014    Allergies  Allergen Reactions  . Codeine Hives  . Shellfish Allergy Hives and Swelling  . Contrast Media [Iodinated Diagnostic Agents] Itching and Other (See Comments)    Whelts on tongue  . Fish Allergy Itching    Past Surgical History:  Procedure Laterality Date  . ANAL FISTULECTOMY N/A 12/21/2014   Procedure: FISTULECTOMY ANAL;  Surgeon: Christene Lye, MD;  Location: [facility] ORS;  Service: General;  Laterality: N/A;  . APPENDECTOMY  1975  . COLONOSCOPY WITH PROPOFOL N/A 02/12/2019   Procedure: COLONOSCOPY WITH PROPOFOL;  Surgeon: Lucilla Lame, MD;  Location: [facility] ENDOSCOPY;  Service: Endoscopy;  Laterality: N/A;  . CORONARY STENT INTERVENTION N/A 09/09/2017   Procedure: CORONARY STENT INTERVENTION;  Surgeon: Wellington Hampshire, MD;  Location: [place]  CV LAB;  Service: Cardiovascular;  Laterality: N/A;  . CORONARY/GRAFT ACUTE MI REVASCULARIZATION N/A 07/18/2017   Procedure:  Coronary/Graft Acute MI Revascularization;  Surgeon: Wellington Hampshire, MD;  Location: [place] CV LAB;  Service: Cardiovascular;  Laterality: N/A;  . HEMORRHOID SURGERY N/A 12/21/2014   Procedure: HEMORRHOIDECTOMY;  Surgeon: Christene Lye, MD;  Location: [facility] ORS;  Service: General;  Laterality: N/A;  . INTRAVASCULAR ULTRASOUND/IVUS N/A 09/09/2017   Procedure: Intravascular Ultrasound/IVUS;  Surgeon: Wellington Hampshire, MD;  Location: [place] CV LAB;  Service: Cardiovascular;  Laterality: N/A;  . KNEE SURGERY Right age 89  . LEFT HEART CATH AND CORONARY ANGIOGRAPHY N/A 07/18/2017   Procedure: LEFT HEART CATH AND CORONARY ANGIOGRAPHY;  Surgeon: Wellington Hampshire, MD;  Location: [place] CV LAB;  Service: Cardiovascular;  Laterality: N/A;  . LEFT HEART CATH AND CORONARY ANGIOGRAPHY Left 08/19/2017   Procedure: LEFT HEART CATH AND CORONARY ANGIOGRAPHY;  Surgeon: Wellington Hampshire, MD;  Location: [place] CV LAB;  Service: Cardiovascular;  Laterality: Left;  . LEFT HEART CATH AND CORONARY ANGIOGRAPHY N/A 11/07/2017   Procedure: LEFT HEART CATH AND CORONARY ANGIOGRAPHY;  Surgeon: Wellington Hampshire, MD;  Location: [place] CV LAB;  Service: Cardiovascular;  Laterality: N/A;  . ROTATOR CUFF REPAIR Right 2013    Social History   Tobacco Use  . Smoking status: Former Smoker    Types: Cigarettes    Quit date: 07/24/2007    Years since quitting: 11.6  . Smokeless tobacco: Never Used  . Tobacco comment: smoking cessation materials not required  Substance Use Topics  . Alcohol use: Yes    Alcohol/week: 18.0 standard drinks    Types: 18 Cans of beer per week    Comment:    . Drug use: No     Medication list has been reviewed and updated.  Current Meds  Medication Sig  . aspirin EC 81 MG tablet Take 81 mg by mouth daily.   Marland Kitchen atorvastatin (LIPITOR) 80 MG tablet TAKE 1 TABLET EVERY DAY AT 6:00PM  . clopidogrel (PLAVIX) 75 MG tablet Take 1 tablet (75 mg total) by  mouth daily.  Marland Kitchen ezetimibe (ZETIA) 10 MG tablet Take 10 mg by mouth daily.  . furosemide (LASIX) 20 MG tablet Take 1 tablet (20 mg total) by mouth daily.  Marland Kitchen losartan (COZAAR) 25 MG tablet Take 1 tablet (25 mg total) by mouth daily.  . metoprolol succinate (TOPROL-XL) 25 MG 24 hr tablet Take 0.5 tablets (12.5 mg total) by mouth daily.  . nitroGLYCERIN (NITROSTAT) 0.4 MG SL tablet Place 1 tablet (0.4 mg total) under the tongue every 5 (five) minutes as needed for chest pain.  . potassium chloride (K-DUR) 10 MEQ tablet Take 1 tablet (10 mEq total) by mouth daily.  . tamsulosin (FLOMAX) 0.4 MG CAPS capsule Take 1 capsule (0.4 mg total) by mouth daily.    PHQ 2/9 Scores 03/31/2019 12/03/2018 10/03/2018 03/28/2018  PHQ - 2 Score 0 0 0 0  PHQ- 9 Score - - - -    BP Readings from Last 3 Encounters:  03/31/19 122/64  03/10/19 (!) 142/80  02/12/19 (!) 163/93    Physical Exam Vitals signs and nursing note reviewed.  Constitutional:      Appearance: Normal appearance. He is well-developed.  HENT:     Head: Normocephalic.     Right Ear: Tympanic membrane, ear canal and external ear normal.     Left Ear: Tympanic membrane, ear canal and external ear normal.  Nose: Nose normal.     Mouth/Throat:     Pharynx: Uvula midline.  Eyes:     Conjunctiva/sclera: Conjunctivae normal.     Pupils: Pupils are equal, round, and reactive to light.  Neck:     Musculoskeletal: Normal range of motion and neck supple.     Thyroid: No thyromegaly.     Vascular: No carotid bruit.  Cardiovascular:     Rate and Rhythm: Normal rate and regular rhythm.     Pulses:          Radial pulses are 2+ on the right side and 2+ on the left side.       Dorsalis pedis pulses are 1+ on the right side and 1+ on the left side.       Posterior tibial pulses are 0 on the right side and 0 on the left side.     Heart sounds: Normal heart sounds.  Pulmonary:     Effort: Pulmonary effort is normal.     Breath sounds: Normal breath  sounds. No wheezing.  Chest:     Breasts:        Right: No mass.        Left: No mass.  Abdominal:     General: Bowel sounds are normal.     Palpations: Abdomen is soft.     Tenderness: There is no abdominal tenderness.  Musculoskeletal: Normal range of motion.     Right lower leg: No edema.     Left lower leg: No edema.     Comments: Trigger finger right middle finger - no changed in the palm  Lymphadenopathy:     Cervical: No cervical adenopathy.  Skin:    General: Skin is warm and dry.     Capillary Refill: Capillary refill takes less than 2 seconds.     Comments: Multiple skin tags and dark moles on face and around eyes  Neurological:     General: No focal deficit present.     Mental Status: He is alert and oriented to person, place, and time.     Deep Tendon Reflexes: Reflexes are normal and symmetric.  Psychiatric:        Attention and Perception: Attention normal.        Mood and Affect: Mood normal.        Speech: Speech normal.        Behavior: Behavior normal.        Thought Content: Thought content normal.        Judgment: Judgment normal.     Wt Readings from Last 3 Encounters:  03/31/19 186 lb (84.4 kg)  03/10/19 187 lb (84.8 kg)  02/12/19 182 lb (82.6 kg)    BP 122/64   Pulse (!) 51   Ht 5\' 7"  (1.702 m)   Wt 186 lb (84.4 kg)   SpO2 96%   BMI 29.13 kg/m   Assessment and Plan: 1. Annual physical exam Normal exam except for weight Continue regular exercise, diet changes Discussed reducing alcohol intake from 4 to 2 beers per day - POCT urinalysis dipstick  2. Essential hypertension Clinically stable exam with well controlled BP.   Tolerating medications, losartan and metoprolol, without side effects at this time. Pt to continue current regimen and low sodium diet; benefits of regular exercise discussed. - CBC with Differential/Platelet - Comprehensive metabolic panel  3. Hyperlipidemia, unspecified hyperlipidemia type Tolerating statin  medication without side effects at this time LDL is almost at goal of < 70  on current dose Continue same therapy without change at this time. Consider zetia. - Lipid panel  4. Trigger middle finger of right hand Mild sx are slowly worsening and interfering with guitar playing - Ambulatory referral to Orthopedic Surgery  5. Neoplasm of uncertain behavior of skin of face - Ambulatory referral to Dermatology  6. Nocturia more than twice per night DRE deferred - PSA  8. PAD (peripheral artery disease) (HCC) Continue Plavix and aspirin Continue regular exercise daily Follow up with cardiology as planned   Partially dictated using [place]. Any errors are unintentional.  Halina Maidens, MD [employer] [facility]  03/31/2019

## 2019-03-31 NOTE — Patient Instructions (Signed)
Melatonin 10 mg - take it 15 min before bedtime.

## 2019-04-01 ENCOUNTER — Other Ambulatory Visit: Payer: Self-pay | Admitting: Internal Medicine

## 2019-04-01 DIAGNOSIS — E785 Hyperlipidemia, unspecified: Secondary | ICD-10-CM

## 2019-04-01 LAB — CBC WITH DIFFERENTIAL/PLATELET
Basophils Absolute: 0.1 10*3/uL (ref 0.0–0.2)
Basos: 1 %
EOS (ABSOLUTE): 0.2 10*3/uL (ref 0.0–0.4)
Eos: 3 %
Hematocrit: 44.1 % (ref 37.5–51.0)
Hemoglobin: 14.5 g/dL (ref 13.0–17.7)
Immature Grans (Abs): 0 10*3/uL (ref 0.0–0.1)
Immature Granulocytes: 0 %
Lymphocytes Absolute: 3.3 10*3/uL — ABNORMAL HIGH (ref 0.7–3.1)
Lymphs: 48 %
MCH: 27.9 pg (ref 26.6–33.0)
MCHC: 32.9 g/dL (ref 31.5–35.7)
MCV: 85 fL (ref 79–97)
Monocytes Absolute: 0.8 10*3/uL (ref 0.1–0.9)
Monocytes: 11 %
Neutrophils Absolute: 2.5 10*3/uL (ref 1.4–7.0)
Neutrophils: 37 %
Platelets: 309 10*3/uL (ref 150–450)
RBC: 5.19 x10E6/uL (ref 4.14–5.80)
RDW: 14.3 % (ref 11.6–15.4)
WBC: 6.9 10*3/uL (ref 3.4–10.8)

## 2019-04-01 LAB — LIPID PANEL
Chol/HDL Ratio: 2.4 ratio (ref 0.0–5.0)
Cholesterol, Total: 168 mg/dL (ref 100–199)
HDL: 70 mg/dL (ref >39)
LDL Chol Calc (NIH): 86 mg/dL (ref 0–99)
Triglycerides: 62 mg/dL (ref 0–149)
VLDL Cholesterol Cal: 12 mg/dL (ref 5–40)

## 2019-04-01 LAB — COMPREHENSIVE METABOLIC PANEL
ALT: 30 IU/L (ref 0–44)
AST: 28 IU/L (ref 0–40)
Albumin/Globulin Ratio: 1.9 (ref 1.2–2.2)
Albumin: 4.8 g/dL (ref 3.8–4.8)
Alkaline Phosphatase: 62 IU/L (ref 39–117)
BUN/Creatinine Ratio: 13 (ref 10–24)
BUN: 13 mg/dL (ref 8–27)
Bilirubin Total: 1.5 mg/dL — ABNORMAL HIGH (ref 0.0–1.2)
CO2: 22 mmol/L (ref 20–29)
Calcium: 9 mg/dL (ref 8.6–10.2)
Chloride: 102 mmol/L (ref 96–106)
Creatinine, Ser: 0.97 mg/dL (ref 0.76–1.27)
GFR calc Af Amer: 94 mL/min/{1.73_m2} (ref >59)
GFR calc non Af Amer: 81 mL/min/{1.73_m2} (ref >59)
Globulin, Total: 2.5 g/dL (ref 1.5–4.5)
Glucose: 93 mg/dL (ref 65–99)
Potassium: 4.3 mmol/L (ref 3.5–5.2)
Sodium: 140 mmol/L (ref 134–144)
Total Protein: 7.3 g/dL (ref 6.0–8.5)

## 2019-04-01 LAB — PSA: Prostate Specific Ag, Serum: 1.2 ng/mL (ref 0.0–4.0)

## 2019-04-01 MED ORDER — EZETIMIBE 10 MG PO TABS: 10.0000 mg | ORAL_TABLET | Freq: Every day | ORAL | 1 refills | Status: DC

## 2019-04-06 DIAGNOSIS — M79644 Pain in right finger(s): Secondary | ICD-10-CM | POA: Diagnosis not present

## 2019-04-06 DIAGNOSIS — M65331 Trigger finger, right middle finger: Secondary | ICD-10-CM | POA: Diagnosis not present

## 2019-04-10 IMAGING — DX DG CHEST 1V PORT
1 series · 1 of 1 positions shown · non-contrast
Comparison: 02/16/2016

CLINICAL DATA: Chest pain

EXAM:
PORTABLE CHEST 1 VIEW

[chest ap]
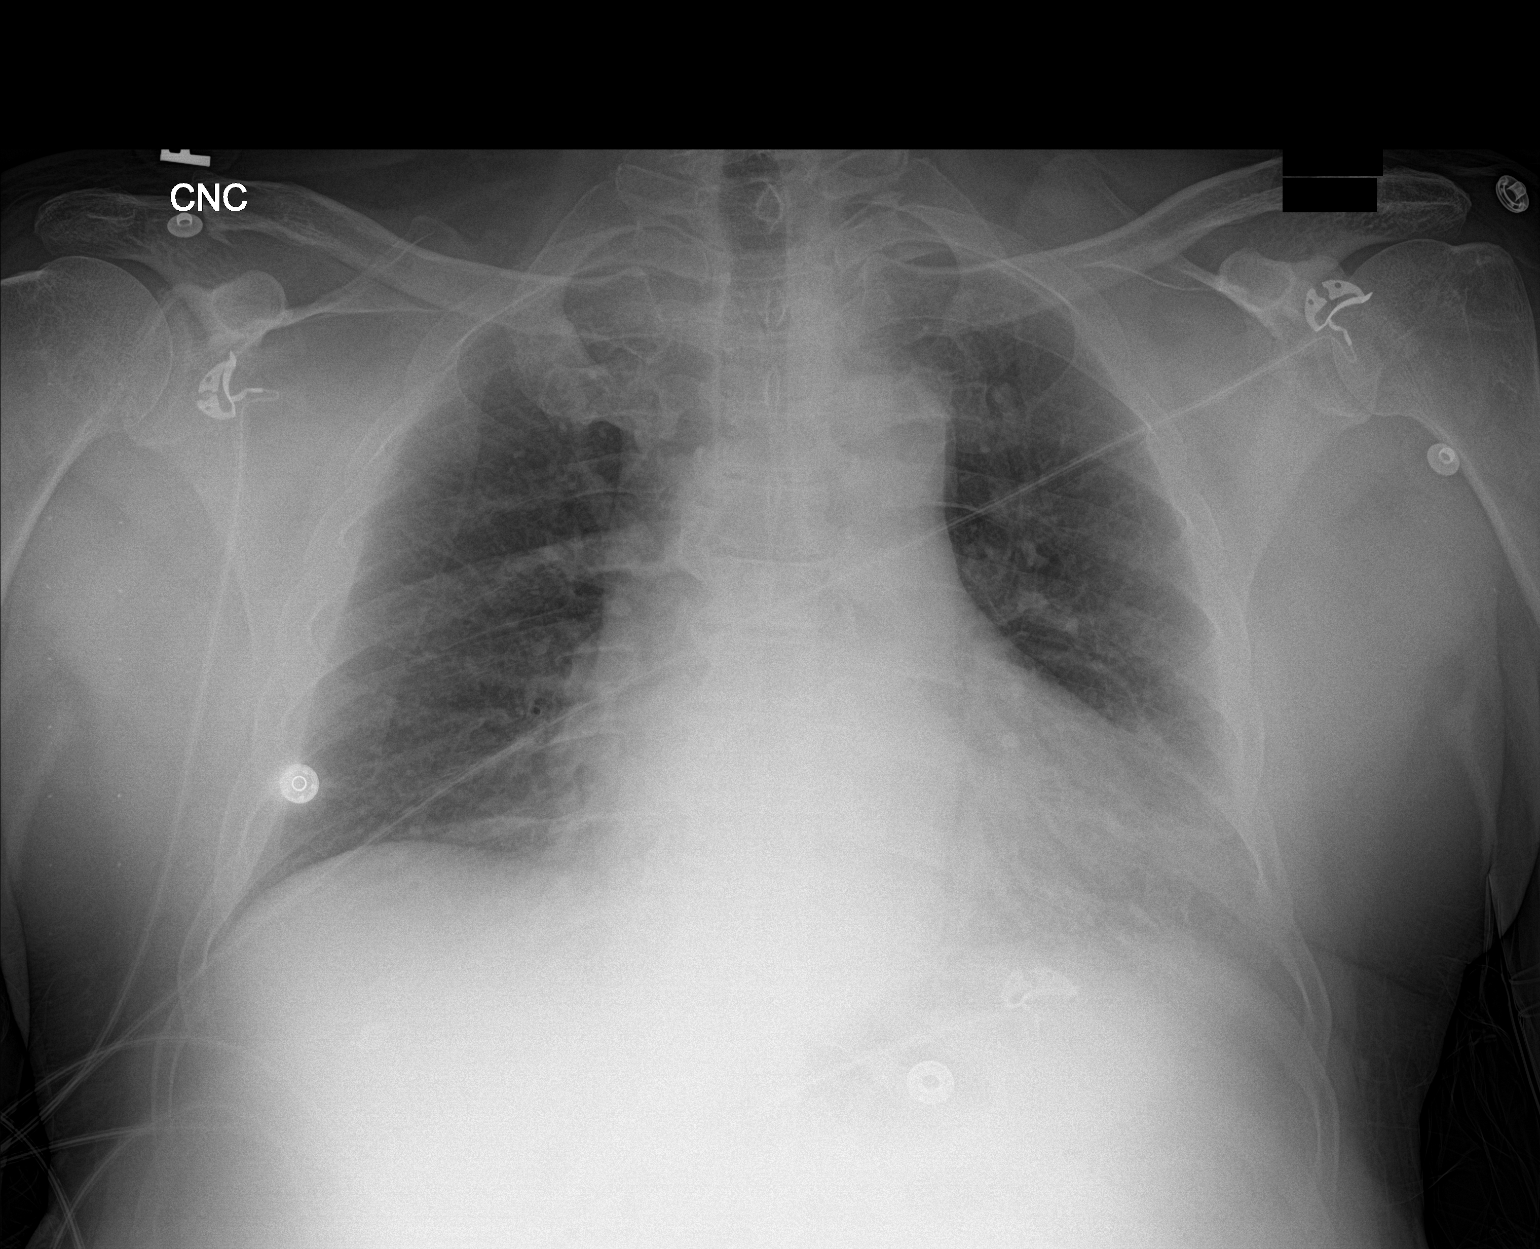

[1 of 1 positions shown; findings below may reference images not displayed]

FINDINGS: Mild cardiac enlargement. No pleural effusion or edema identified.
No airspace opacities identified.
IMPRESSION: No active disease.

## 2019-04-14 IMAGING — DX DG CHEST 1V PORT
1 series · 1 of 1 positions shown · non-contrast
Comparison: Chest radiograph performed 07/18/2017

CLINICAL DATA: Acute onset of respiratory distress.

EXAM:
PORTABLE CHEST 1 VIEW

[chest ap]
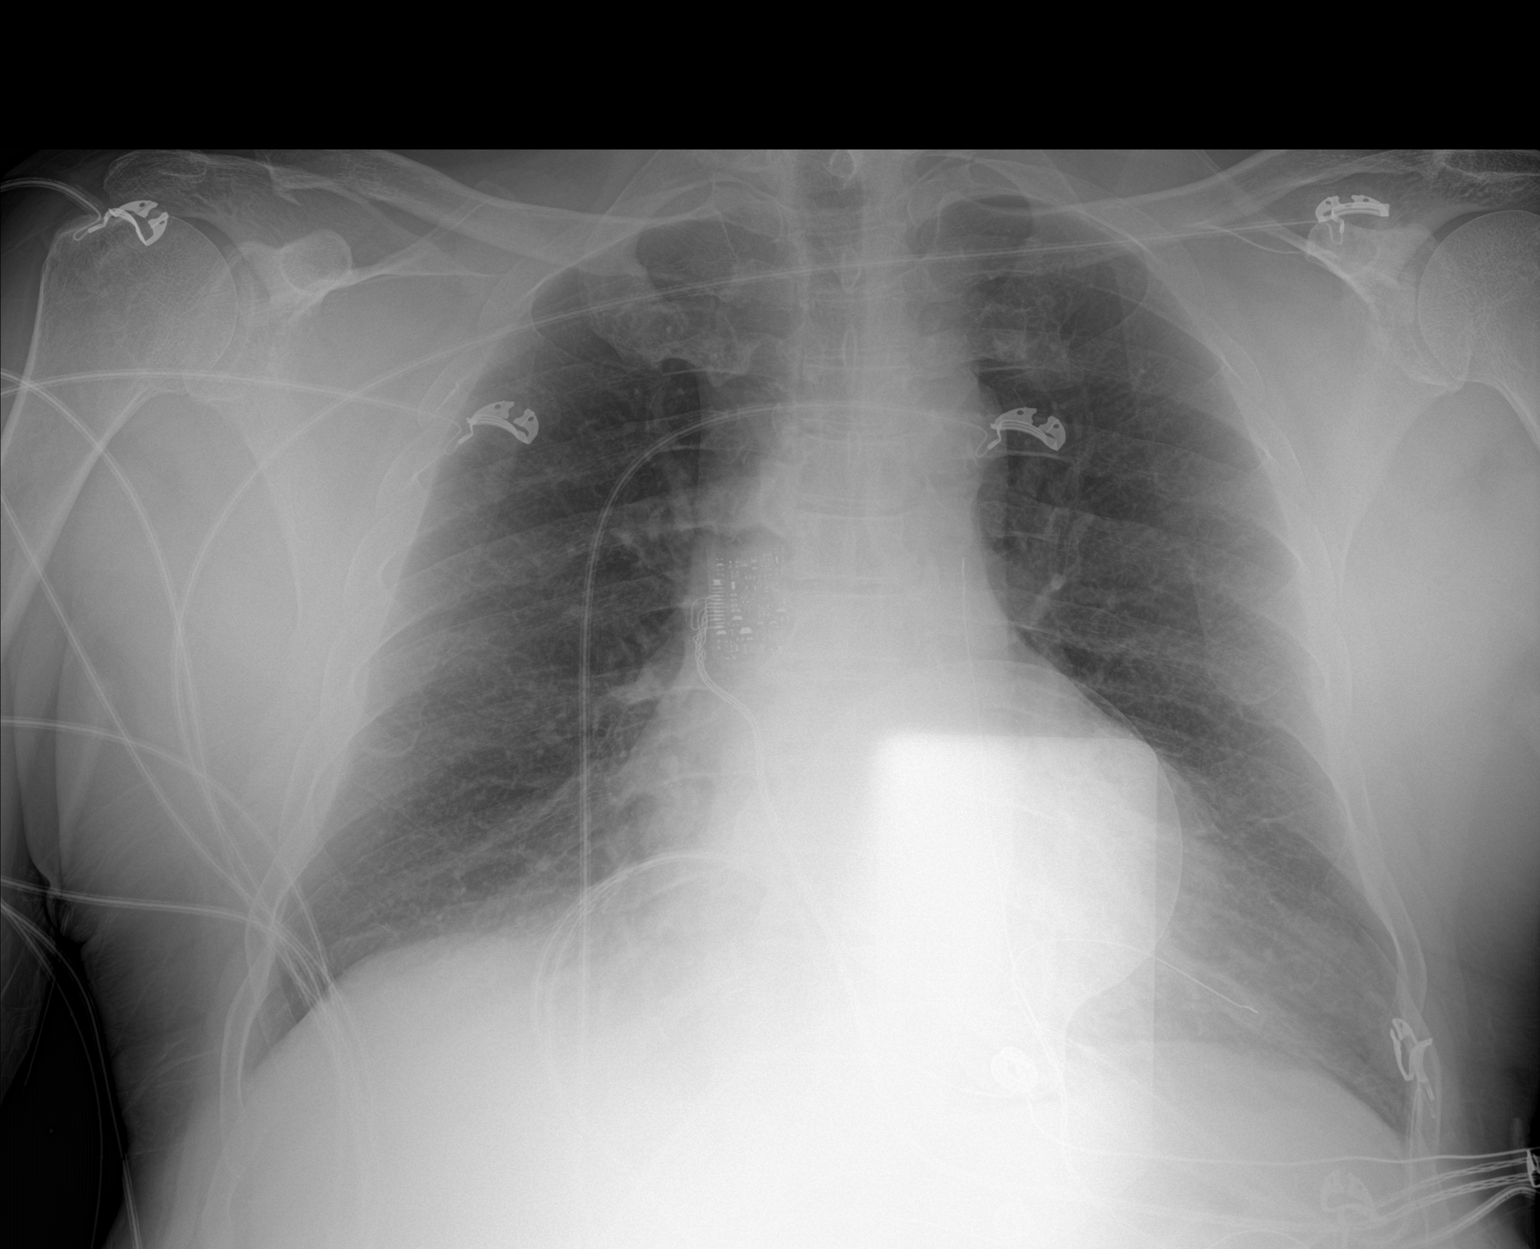

[1 of 1 positions shown; findings below may reference images not displayed]

FINDINGS: The lungs are well-aerated and clear. There is no evidence of focal
opacification, pleural effusion or pneumothorax.

The cardiomediastinal silhouette is within normal limits. No acute
osseous abnormalities are seen. External pacing pads are noted.
IMPRESSION: No acute cardiopulmonary process seen.

## 2019-04-15 DIAGNOSIS — L918 Other hypertrophic disorders of the skin: Secondary | ICD-10-CM | POA: Diagnosis not present

## 2019-05-06 DIAGNOSIS — H1013 Acute atopic conjunctivitis, bilateral: Secondary | ICD-10-CM | POA: Diagnosis not present

## 2019-05-06 DIAGNOSIS — H40003 Preglaucoma, unspecified, bilateral: Secondary | ICD-10-CM | POA: Diagnosis not present

## 2019-07-08 ENCOUNTER — Ambulatory Visit (INDEPENDENT_AMBULATORY_CARE_PROVIDER_SITE_OTHER): Payer: Medicare HMO | Admitting: Internal Medicine

## 2019-07-08 ENCOUNTER — Encounter: Payer: Self-pay | Admitting: Internal Medicine

## 2019-07-08 ENCOUNTER — Other Ambulatory Visit: Payer: Self-pay

## 2019-07-08 VITALS — BP 104/62 | HR 54 | Ht 67.0 in | Wt 186.0 lb

## 2019-07-08 DIAGNOSIS — L03311 Cellulitis of abdominal wall: Secondary | ICD-10-CM | POA: Diagnosis not present

## 2019-07-08 DIAGNOSIS — T300 Burn of unspecified body region, unspecified degree: Secondary | ICD-10-CM

## 2019-07-08 MED ORDER — SILVER SULFADIAZINE 1 % EX CREA: 1.0000 "application " | TOPICAL_CREAM | Freq: Every day | CUTANEOUS | 1 refills | Status: DC

## 2019-07-08 MED ORDER — GABAPENTIN 100 MG PO CAPS: 100.0000 mg | ORAL_CAPSULE | Freq: Every day | ORAL | 0 refills | Status: DC

## 2019-07-08 MED ORDER — SULFAMETHOXAZOLE-TRIMETHOPRIM 800-160 MG PO TABS: 1.0000 | ORAL_TABLET | Freq: Two times a day (BID) | ORAL | 0 refills | Status: AC

## 2019-07-08 NOTE — Patient Instructions (Signed)
Cleanse area with warm water and pat dry - no scrubbing, no alcohol or peroxide to the burn  Apply thin layer of silvadene and cover it with a non stick gauze/dressing.  Change it once a day or as needed for soiled.
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
## 2019-08-14 ENCOUNTER — Emergency Department
Admission: EM | Admit: 2019-08-14 | Discharge: 2019-08-14 | Disposition: A | Discharge status: Home / Self Care | Payer: Medicare HMO | Attending: Emergency Medicine | Admitting: Emergency Medicine

## 2019-08-14 ENCOUNTER — Encounter: Payer: Self-pay | Admitting: Emergency Medicine

## 2019-08-14 ENCOUNTER — Encounter: Payer: Self-pay | Admitting: Family

## 2019-08-14 ENCOUNTER — Other Ambulatory Visit: Payer: Self-pay

## 2019-08-14 ENCOUNTER — Emergency Department: Payer: Medicare HMO

## 2019-08-14 ENCOUNTER — Ambulatory Visit (INDEPENDENT_AMBULATORY_CARE_PROVIDER_SITE_OTHER): Payer: Medicare HMO | Admitting: Family

## 2019-08-14 VITALS — BP 130/76 | HR 64 | Ht 67.0 in | Wt 186.0 lb

## 2019-08-14 DIAGNOSIS — I5022 Chronic systolic (congestive) heart failure: Secondary | ICD-10-CM | POA: Diagnosis not present

## 2019-08-14 DIAGNOSIS — Y9389 Activity, other specified: Secondary | ICD-10-CM | POA: Diagnosis not present

## 2019-08-14 DIAGNOSIS — Z7902 Long term (current) use of antithrombotics/antiplatelets: Secondary | ICD-10-CM | POA: Insufficient documentation

## 2019-08-14 DIAGNOSIS — Z87891 Personal history of nicotine dependence: Secondary | ICD-10-CM | POA: Diagnosis not present

## 2019-08-14 DIAGNOSIS — I2510 Atherosclerotic heart disease of native coronary artery without angina pectoris: Secondary | ICD-10-CM | POA: Diagnosis not present

## 2019-08-14 DIAGNOSIS — Y9289 Other specified places as the place of occurrence of the external cause: Secondary | ICD-10-CM | POA: Insufficient documentation

## 2019-08-14 DIAGNOSIS — R079 Chest pain, unspecified: Secondary | ICD-10-CM | POA: Diagnosis not present

## 2019-08-14 DIAGNOSIS — Z7982 Long term (current) use of aspirin: Secondary | ICD-10-CM | POA: Diagnosis not present

## 2019-08-14 DIAGNOSIS — E785 Hyperlipidemia, unspecified: Secondary | ICD-10-CM | POA: Diagnosis not present

## 2019-08-14 DIAGNOSIS — I739 Peripheral vascular disease, unspecified: Secondary | ICD-10-CM | POA: Diagnosis not present

## 2019-08-14 DIAGNOSIS — Z79899 Other long term (current) drug therapy: Secondary | ICD-10-CM | POA: Insufficient documentation

## 2019-08-14 DIAGNOSIS — Z955 Presence of coronary angioplasty implant and graft: Secondary | ICD-10-CM | POA: Diagnosis not present

## 2019-08-14 DIAGNOSIS — X509XXA Other and unspecified overexertion or strenuous movements or postures, initial encounter: Secondary | ICD-10-CM | POA: Diagnosis not present

## 2019-08-14 DIAGNOSIS — I25118 Atherosclerotic heart disease of native coronary artery with other forms of angina pectoris: Secondary | ICD-10-CM | POA: Diagnosis not present

## 2019-08-14 DIAGNOSIS — Y999 Unspecified external cause status: Secondary | ICD-10-CM | POA: Diagnosis not present

## 2019-08-14 DIAGNOSIS — I10 Essential (primary) hypertension: Secondary | ICD-10-CM | POA: Diagnosis not present

## 2019-08-14 DIAGNOSIS — R0602 Shortness of breath: Secondary | ICD-10-CM | POA: Diagnosis not present

## 2019-08-14 DIAGNOSIS — M79602 Pain in left arm: Secondary | ICD-10-CM | POA: Diagnosis not present

## 2019-08-14 DIAGNOSIS — R001 Bradycardia, unspecified: Secondary | ICD-10-CM | POA: Diagnosis not present

## 2019-08-14 DIAGNOSIS — R0789 Other chest pain: Secondary | ICD-10-CM | POA: Diagnosis not present

## 2019-08-14 LAB — COMPREHENSIVE METABOLIC PANEL
ALT: 30 U/L (ref 0–44)
AST: 31 U/L (ref 15–41)
Albumin: 4.2 g/dL (ref 3.5–5.0)
Alkaline Phosphatase: 47 U/L (ref 38–126)
Anion gap: 9 (ref 5–15)
BUN: 14 mg/dL (ref 8–23)
CO2: 24 mmol/L (ref 22–32)
Calcium: 8.9 mg/dL (ref 8.9–10.3)
Chloride: 105 mmol/L (ref 98–111)
Creatinine, Ser: 0.98 mg/dL (ref 0.61–1.24)
GFR calc Af Amer: >60 mL/min (ref >60)
GFR calc non Af Amer: >60 mL/min (ref >60)
Glucose, Bld: 105 mg/dL — ABNORMAL HIGH (ref 70–99)
Potassium: 3.8 mmol/L (ref 3.5–5.1)
Sodium: 138 mmol/L (ref 135–145)
Total Bilirubin: 1.4 mg/dL — ABNORMAL HIGH (ref 0.3–1.2)
Total Protein: 7.3 g/dL (ref 6.5–8.1)

## 2019-08-14 LAB — CBC WITH DIFFERENTIAL/PLATELET
Abs Immature Granulocytes: 0.02 10*3/uL (ref 0.00–0.07)
Basophils Absolute: 0.1 10*3/uL (ref 0.0–0.1)
Basophils Relative: 1 %
Eosinophils Absolute: 0.3 10*3/uL (ref 0.0–0.5)
Eosinophils Relative: 5 %
HCT: 40.3 % (ref 39.0–52.0)
Hemoglobin: 13.8 g/dL (ref 13.0–17.0)
Immature Granulocytes: 0 %
Lymphocytes Relative: 48 %
Lymphs Abs: 2.9 10*3/uL (ref 0.7–4.0)
MCH: 28.5 pg (ref 26.0–34.0)
MCHC: 34.2 g/dL (ref 30.0–36.0)
MCV: 83.3 fL (ref 80.0–100.0)
Monocytes Absolute: 0.6 10*3/uL (ref 0.1–1.0)
Monocytes Relative: 10 %
Neutro Abs: 2.2 10*3/uL (ref 1.7–7.7)
Neutrophils Relative %: 36 %
Platelets: 254 10*3/uL (ref 150–400)
RBC: 4.84 MIL/uL (ref 4.22–5.81)
RDW: 14.5 % (ref 11.5–15.5)
WBC: 6 10*3/uL (ref 4.0–10.5)
nRBC: 0 % (ref 0.0–0.2)

## 2019-08-14 LAB — TROPONIN I (HIGH SENSITIVITY)
Troponin I (High Sensitivity): 4 ng/L
Troponin I (High Sensitivity): 4 ng/L

## 2019-08-14 NOTE — ED Provider Notes (Signed)
[facility] Emergency Department Provider Note  ____________________________________________  Time seen: Approximately 11:17 AM  I have reviewed the triage vital signs and the nursing notes.   HISTORY  Chief Complaint Chest Pain    HPI Todd Weaver is a 69 y.o. male with a history of CAD status post STEMI requiring PCI, GERD, paroxysmal A. fib, hypertension who reports being in his usual state of health, woke up this morning with left arm pain that feels similar to his previous MI.  Pain lasted for about an hour from 6 AM to 7 AM and then resolved after taking 1 nitroglycerin and 324 mg of aspirin.  Not exertional, not pleuritic.  Nonradiating.  Contrary to the triage note he denies any chest pain at all, denies shortness of breath diaphoresis or vomiting.   He notes that yesterday he was doing some strenuous work with wood outside, and had no pain during that time but wonders if he overexerted himself or strained his arm..     Past Medical History:  Diagnosis Date  . Acute ST elevation myocardial infarction (STEMI) of inferior wall ([place]) 07/18/2017  . CAD (coronary artery disease)    a. 03/2016 MV: EF 55-65%, low risk; b. 06/2017 Inf STEMI: LM 85d, LAD 60p, 76m, LCX 40p, RCA 99ost (3.25x18 Xience [place] DES/3.0x38 [place] DES), RPAV 60; c. 07/2017 Cath: LM 85d, LAD 85ost, 60p, 76m, RI 50ost, LCX 50p, RCA patent stent, RPAD/RPAV min irregs; c. 08/2017 PCI dLM/LAD (3.0x38 Xience [place] DES); d. 10/2017 Cath: LM/prox LAD patent, LAD 28m, 40d, RI 70, LCX 40p, RCA patent stent. EF 50-55  . Contrast media allergy   . GERD (gastroesophageal reflux disease)   . Hemorrhoid   . Hyperlipidemia   . Hypertension   . Ischemic cardiomyopathy    a. 06/2017 Echo: EF 45-50%, mod inf/infsept HK. Mildly dil LA.  Marland Kitchen PAF (paroxysmal atrial fibrillation) ([place])    a. Brief episode during December hospitalization-->converted on IV amio-->not on [place].  Marland Kitchen Stroke [facility]) 2011   "2 MINI  STROKES". no deficits.  Marland Kitchen Unstable angina [facility])      Patient Active Problem List   Diagnosis Date Noted  . PAD (peripheral artery disease) ([place]) 03/31/2019  . Encounter for screening colonoscopy   . Rectal polyp   . Pain in joint of left shoulder 10/14/2018  . Impingement syndrome of left shoulder region 10/14/2018  . Coronary artery disease of native artery of native heart with stable angina pectoris ([place]) 10/03/2018  . Chronic left shoulder pain 10/03/2018  . Daytime sleepiness 09/23/2017  . History of ST elevation myocardial infarction (STEMI) 07/18/2017  . Hypertension 06/19/2017  . Hyperlipidemia 06/19/2017  . History of CVA (cerebrovascular accident) 06/19/2017  . Overweight (BMI 25.0-29.9) 06/19/2017  . BPH (benign prostatic hyperplasia) 06/19/2017  . ED (erectile dysfunction) 06/19/2017  . MCI (mild cognitive impairment) 06/19/2017  . FH: Alzheimer's disease 06/19/2017  . Lumbar spondylosis with myelopathy 11/08/2016  . Hand dermatitis 07/26/2014     Past Surgical History:  Procedure Laterality Date  . ANAL FISTULECTOMY N/A 12/21/2014   Procedure: FISTULECTOMY ANAL;  Surgeon: Christene Lye, MD;  Location: [facility] ORS;  Service: General;  Laterality: N/A;  . APPENDECTOMY  1975  . COLONOSCOPY WITH PROPOFOL N/A 02/12/2019   Procedure: COLONOSCOPY WITH PROPOFOL;  Surgeon: Lucilla Lame, MD;  Location: [facility] ENDOSCOPY;  Service: Endoscopy;  Laterality: N/A;  . CORONARY STENT INTERVENTION N/A 09/09/2017   Procedure: CORONARY STENT INTERVENTION;  Surgeon: Wellington Hampshire, MD;  Location: [place] CV LAB;  Service: Cardiovascular;  Laterality: N/A;  . CORONARY/GRAFT ACUTE MI REVASCULARIZATION N/A 07/18/2017   Procedure: Coronary/Graft Acute MI Revascularization;  Surgeon: Wellington Hampshire, MD;  Location: [place] CV LAB;  Service: Cardiovascular;  Laterality: N/A;  . HEMORRHOID SURGERY N/A 12/21/2014   Procedure: HEMORRHOIDECTOMY;  Surgeon: Christene Lye,  MD;  Location: [facility] ORS;  Service: General;  Laterality: N/A;  . INTRAVASCULAR ULTRASOUND/IVUS N/A 09/09/2017   Procedure: Intravascular Ultrasound/IVUS;  Surgeon: Wellington Hampshire, MD;  Location: [place] CV LAB;  Service: Cardiovascular;  Laterality: N/A;  . KNEE SURGERY Right age 89  . LEFT HEART CATH AND CORONARY ANGIOGRAPHY N/A 07/18/2017   Procedure: LEFT HEART CATH AND CORONARY ANGIOGRAPHY;  Surgeon: Wellington Hampshire, MD;  Location: [place] CV LAB;  Service: Cardiovascular;  Laterality: N/A;  . LEFT HEART CATH AND CORONARY ANGIOGRAPHY Left 08/19/2017   Procedure: LEFT HEART CATH AND CORONARY ANGIOGRAPHY;  Surgeon: Wellington Hampshire, MD;  Location: [place] CV LAB;  Service: Cardiovascular;  Laterality: Left;  . LEFT HEART CATH AND CORONARY ANGIOGRAPHY N/A 11/07/2017   Procedure: LEFT HEART CATH AND CORONARY ANGIOGRAPHY;  Surgeon: Wellington Hampshire, MD;  Location: [place] CV LAB;  Service: Cardiovascular;  Laterality: N/A;  . ROTATOR CUFF REPAIR Right 2013     Prior to Admission medications   Medication Sig Start Date End Date Taking? Authorizing Provider  aspirin EC 81 MG tablet Take 81 mg by mouth daily.  05/23/09   [provider]  atorvastatin (LIPITOR) 80 MG tablet TAKE 1 TABLET EVERY DAY AT 6:00PM 02/25/19   Glean Hess, MD  clopidogrel (PLAVIX) 75 MG tablet Take 1 tablet (75 mg total) by mouth daily. 02/25/19   Glean Hess, MD  ezetimibe (ZETIA) 10 MG tablet Take 1 tablet (10 mg total) by mouth daily. 04/01/19   Glean Hess, MD  furosemide (LASIX) 20 MG tablet Take 1 tablet (20 mg total) by mouth daily. 02/25/19   Glean Hess, MD  gabapentin (NEURONTIN) 100 MG capsule Take 1 capsule (100 mg total) by mouth at bedtime. 07/08/19   Glean Hess, MD  losartan (COZAAR) 25 MG tablet Take 1 tablet (25 mg total) by mouth daily. 02/25/19   Glean Hess, MD  metoprolol succinate (TOPROL-XL) 25 MG 24 hr tablet Take 0.5 tablets (12.5 mg  total) by mouth daily. 02/25/19   Glean Hess, MD  nitroGLYCERIN (NITROSTAT) 0.4 MG SL tablet Place 1 tablet (0.4 mg total) under the tongue every 5 (five) minutes as needed for chest pain. 02/25/19   Glean Hess, MD  potassium chloride (K-DUR) 10 MEQ tablet Take 1 tablet (10 mEq total) by mouth daily. 02/25/19   Glean Hess, MD  silver sulfADIAZINE (SILVADENE) 1 % cream Apply 1 application topically daily. To burn on abdomen 07/08/19   Glean Hess, MD  tamsulosin [facility]) 0.4 MG CAPS capsule Take 1 capsule (0.4 mg total) by mouth daily. 02/25/19   Glean Hess, MD     Allergies Codeine, Shellfish allergy, Contrast media [iodinated diagnostic agents], and Fish allergy   Family History  Problem Relation Age of Onset  . Hypertension Mother   . Alzheimer's disease Mother   . Hypertension Father   . Alzheimer's disease Father     Social History Social History   Tobacco Use  . Smoking status: Former Smoker    Types: Cigarettes    Quit date: 07/24/2007  Years since quitting: 12.0  . Smokeless tobacco: Never Used  . Tobacco comment: smoking cessation materials not required  Substance Use Topics  . Alcohol use: Yes    Alcohol/week: 18.0 standard drinks    Types: 18 Cans of beer per week    Comment:    . Drug use: No    Review of Systems  Constitutional:   No fever or chills.  ENT:   No sore throat. No rhinorrhea. Cardiovascular:   No chest pain or syncope. Respiratory:   No dyspnea or cough. Gastrointestinal:   Negative for abdominal pain, vomiting and diarrhea.  Musculoskeletal:   Left arm pain as above All other systems reviewed and are negative except as documented above in ROS and HPI.  ____________________________________________   PHYSICAL EXAM:  VITAL SIGNS: ED Triage Vitals  Enc Vitals Group     BP 08/14/19 0718 (!) 159/82     Pulse Rate 08/14/19 0718 (!) 55     Resp 08/14/19 0718 14     Temp 08/14/19 0728 97.6 F (36.4 C)     Temp  Source 08/14/19 0728 Oral     SpO2 08/14/19 0718 99 %     Weight 08/14/19 0718 190 lb (86.2 kg)     Height 08/14/19 0718 5\' 7"  (1.702 m)     Head Circumference --      Peak Flow --      Pain Score 08/14/19 0718 0     Pain Loc --      Pain Edu? --      Excl. in [place]? --     Vital signs reviewed, nursing assessments reviewed.   Constitutional:   Alert and oriented. Non-toxic appearance. Eyes:   Conjunctivae are normal. EOMI. PERRL. ENT      Head:   Normocephalic and atraumatic.      Nose:   Wearing a mask.      Mouth/Throat:   Wearing a mask.      Neck:   No meningismus. Full ROM. Hematological/Lymphatic/Immunilogical:   No cervical lymphadenopathy. Cardiovascular:   RRR. Symmetric bilateral radial and DP pulses.  No murmurs. Cap refill less than 2 seconds. Respiratory:   Normal respiratory effort without tachypnea/retractions. Breath sounds are clear and equal bilaterally. No wheezes/rales/rhonchi. Gastrointestinal:   Soft and nontender. Non distended. There is no CVA tenderness.  No rebound, rigidity, or guarding. Musculoskeletal:   Normal range of motion in all extremities. No joint effusions.  No lower extremity tenderness.  No edema. Neurologic:   Normal speech and language.  Motor grossly intact. No acute focal neurologic deficits are appreciated.  Skin:    Skin is warm, dry and intact. No rash noted.  No petechiae, purpura, or bullae.  ____________________________________________    LABS (pertinent positives/negatives) (all labs ordered are listed, but only abnormal results are displayed) Labs Reviewed  COMPREHENSIVE METABOLIC PANEL - Abnormal; Notable for the following components:      Result Value   Glucose, Bld 105 (*)    Total Bilirubin 1.4 (*)    All other components within normal limits  CBC WITH DIFFERENTIAL/PLATELET  TROPONIN I (HIGH SENSITIVITY)  TROPONIN I (HIGH SENSITIVITY)   ____________________________________________   EKG  Interpreted by  me Sinus bradycardia, rate of 54.  Normal axis and intervals.  Left bundle branch block.  No acute ischemic changes.  ____________________________________________    [id number]  DG Chest Portable 1 View  Result Date: 08/14/2019 CLINICAL DATA:  Chest pain and shortness of breath EXAM: PORTABLE CHEST  1 VIEW COMPARISON:  November 05, 2017 FINDINGS: There is mild scarring in the left base. Lungs elsewhere are clear. Heart is upper normal in size with pulmonary vascularity normal. No adenopathy. There is aortic atherosclerosis. No bone lesions. IMPRESSION: Stable scarring left base. Lungs elsewhere clear. Stable cardiac silhouette. No adenopathy. Aortic Atherosclerosis (ICD10-I70.0). Electronically Signed   By: Lowella Grip III M.D.   On: 08/14/2019 08:12    ____________________________________________   PROCEDURES Procedures  ____________________________________________  DIFFERENTIAL DIAGNOSIS   Non-STEMI, musculoskeletal pain  CLINICAL IMPRESSION / ASSESSMENT AND PLAN / ED COURSE  Medications ordered in the ED: Medications - No data to display  Pertinent labs & imaging results that were available during my care of the patient were reviewed by me and considered in my medical decision making (see chart for details).  Todd Weaver was evaluated in Emergency Department on 08/14/2019 for the symptoms described in the history of present illness. He was evaluated in the context of the global COVID-19 pandemic, which necessitated consideration that the patient might be at risk for infection with the SARS-CoV-2 virus that causes COVID-19. Institutional protocols and algorithms that pertain to the evaluation of patients at risk for COVID-19 are in a state of rapid change based on information released by regulatory bodies including the CDC and federal and state organizations. These policies and algorithms were followed during the patient's care in the ED.   Patient presents with left arm pain,  not anginal in nature, but given his sense that it is similar to prior MI, I am obtaining chest x-ray, EKG, serial troponins.  The EKG itself is nonischemic.  He has no pain at current time and it troponins okay will be suitable for outpatient follow-up.  Clinical Course as of Aug 14 1115  Fri Aug 14, 2019  0831 Cxr unremarkable.    [PS]    Clinical Course User Index [PS] Carrie Mew, MD     ----------------------------------------- 11:20 AM on 08/14/2019 -----------------------------------------  2 troponins are both normal.  Patient has had no recurrence of symptoms and continues to feel at baseline in the ED.  I will discharge home to follow-up with his cardiologist.  Discussed with Dr. Fletcher Anon, who is arranging with his clinic for close follow-up.  ____________________________________________   FINAL CLINICAL IMPRESSION(S) / ED DIAGNOSES    Final diagnoses:  Left arm pain     ED Discharge Orders    None      Portions of this note were generated with dragon dictation software. Dictation errors may occur despite best attempts at proofreading.   Carrie Mew, MD 08/14/19 1121

## 2019-08-14 NOTE — ED Triage Notes (Signed)
Pt arrived EMS for chest pain to left chest and arm. Feels similar to last MI.  Pt had SHOB with chest pain. Currently no chest pain or SHOB. Pt took 324 mg ASA and one NTG of his own that relieved all sx.  No fever or cough. Pain woke pt from sleep

## 2019-08-14 NOTE — ED Notes (Signed)
Pt given ginger ale. Repeat troponin sent.  NAD. Remains pain free at this time.

## 2019-08-14 NOTE — Patient Instructions (Addendum)
Medication Instructions: 1- STOP Gabapentin   *If you need a refill on your cardiac medications before your next appointment, please call your pharmacy*  Lab Work: None ordered  If you have labs (blood work) drawn today and your tests are completely normal, you will receive your results only by: Marland Kitchen MyChart Message (if you have MyChart) OR . A paper copy in the mail If you have any lab test that is abnormal or we need to change your treatment, we will call you to review the results.  Testing/Procedures: 1- [place]  Your caregiver has ordered a Stress Test with nuclear imaging. The purpose of this test is to evaluate the blood supply to your heart muscle. This procedure is referred to as a "Non-Invasive Stress Test." This is because other than having an IV started in your vein, nothing is inserted or "invades" your body. Cardiac stress tests are done to find areas of poor blood flow to the heart by determining the extent of coronary artery disease (CAD). Some patients exercise on a treadmill, which naturally increases the blood flow to your heart, while others who are  unable to walk on a treadmill due to physical limitations have a pharmacologic/chemical stress agent called Lexiscan . This medicine will mimic walking on a treadmill by temporarily increasing your coronary blood flow.   Please note: these test may take anywhere between 2-4 hours to complete  PLEASE REPORT TO [place] AT THE FIRST DESK WILL DIRECT YOU WHERE TO GO  Date of Procedure:_____________________________________  Arrival Time for Procedure:______________________________  Instructions regarding medication:   __x__:  Hold betablocker(s) night before procedure and morning of procedure (metoprolol)  ___x_:  Hold other medications as follows:___Lasix_____________________________________________________________________________________________________  PLEASE NOTIFY THE OFFICE AT  LEAST 24 HOURS IN ADVANCE IF YOU ARE UNABLE TO KEEP YOUR APPOINTMENT.  [phone] AND  PLEASE NOTIFY NUCLEAR MEDICINE AT [facility] AT LEAST 24 HOURS IN ADVANCE IF YOU ARE UNABLE TO KEEP YOUR APPOINTMENT. [phone]  How to prepare for your Myoview test:  1. Do not eat or drink after midnight 2. No caffeine for 24 hours prior to test 3. No smoking 24 hours prior to test. 4. Your medication may be taken with water.  If your doctor stopped a medication because of this test, do not take that medication. 5. Ladies, please do not wear dresses.  Skirts or pants are appropriate. Please wear a short sleeve shirt. 6. No perfume, cologne or lotion. 7. Wear comfortable walking shoes. No heels!      Follow-Up: At [facility], you and your health needs are our priority.  As part of our continuing mission to provide you with exceptional heart care, we have created designated Provider Care Teams.  These Care Teams include your primary Cardiologist (physician) and Advanced Practice Providers (APPs -  Physician Assistants and Nurse Practitioners) who all work together to provide you with the care you need, when you need it.  Your next appointment:

## 2019-08-14 NOTE — Progress Notes (Signed)
Office Visit    Patient Name: Todd Weaver Date of Encounter: 08/14/2019  Primary Care Provider:  Glean Hess, MD Primary Cardiologist:  Kathlyn Sacramento, MD Electrophysiologist:  None   Chief Complaint    Todd Weaver is a 69 y.o. male with a hx of CAD s/p inferior STEMI complicated by ventricular fibrillation arrest 06/2017 treated with PCI in 2 DES to RCA, chronic systolic heart failure, postoperative atrial fibrillation treated with amiodarone, previous CVA, HLD, previous tobacco use, BPH, PAD presents today for follow-up after ED visit for arm pain this morning.  Past Medical History    Past Medical History:  Diagnosis Date  . Acute ST elevation myocardial infarction (STEMI) of inferior wall ([place]) 07/18/2017  . CAD (coronary artery disease)    a. 03/2016 MV: EF 55-65%, low risk; b. 06/2017 Inf STEMI: LM 85d, LAD 60p, 49m, LCX 40p, RCA 99ost (3.25x18 Xience [place] DES/3.0x38 [place] DES), RPAV 60; c. 07/2017 Cath: LM 85d, LAD 85ost, 60p, 49m, RI 50ost, LCX 50p, RCA patent stent, RPAD/RPAV min irregs; c. 08/2017 PCI dLM/LAD (3.0x38 Xience [place] DES); d. 10/2017 Cath: LM/prox LAD patent, LAD 66m, 40d, RI 70, LCX 40p, RCA patent stent. EF 50-55  . Contrast media allergy   . GERD (gastroesophageal reflux disease)   . Hemorrhoid   . Hyperlipidemia   . Hypertension   . Ischemic cardiomyopathy    a. 06/2017 Echo: EF 45-50%, mod inf/infsept HK. Mildly dil LA.  Marland Kitchen PAF (paroxysmal atrial fibrillation) ([place])    a. Brief episode during December hospitalization-->converted on IV amio-->not on [place].  Marland Kitchen Stroke [facility]) 2011   "2 MINI STROKES". no deficits.  Marland Kitchen Unstable angina [facility])    Past Surgical History:  Procedure Laterality Date  . ANAL FISTULECTOMY N/A 12/21/2014   Procedure: FISTULECTOMY ANAL;  Surgeon: Christene Lye, MD;  Location: [facility] ORS;  Service: General;  Laterality: N/A;  . APPENDECTOMY  1975  . COLONOSCOPY WITH PROPOFOL N/A 02/12/2019   Procedure: COLONOSCOPY  WITH PROPOFOL;  Surgeon: Lucilla Lame, MD;  Location: [facility] ENDOSCOPY;  Service: Endoscopy;  Laterality: N/A;  . CORONARY STENT INTERVENTION N/A 09/09/2017   Procedure: CORONARY STENT INTERVENTION;  Surgeon: Wellington Hampshire, MD;  Location: [place] CV LAB;  Service: Cardiovascular;  Laterality: N/A;  . CORONARY/GRAFT ACUTE MI REVASCULARIZATION N/A 07/18/2017   Procedure: Coronary/Graft Acute MI Revascularization;  Surgeon: Wellington Hampshire, MD;  Location: [place] CV LAB;  Service: Cardiovascular;  Laterality: N/A;  . HEMORRHOID SURGERY N/A 12/21/2014   Procedure: HEMORRHOIDECTOMY;  Surgeon: Christene Lye, MD;  Location: [facility] ORS;  Service: General;  Laterality: N/A;  . INTRAVASCULAR ULTRASOUND/IVUS N/A 09/09/2017   Procedure: Intravascular Ultrasound/IVUS;  Surgeon: Wellington Hampshire, MD;  Location: [place] CV LAB;  Service: Cardiovascular;  Laterality: N/A;  . KNEE SURGERY Right age 61  . LEFT HEART CATH AND CORONARY ANGIOGRAPHY N/A 07/18/2017   Procedure: LEFT HEART CATH AND CORONARY ANGIOGRAPHY;  Surgeon: Wellington Hampshire, MD;  Location: [place] CV LAB;  Service: Cardiovascular;  Laterality: N/A;  . LEFT HEART CATH AND CORONARY ANGIOGRAPHY Left 08/19/2017   Procedure: LEFT HEART CATH AND CORONARY ANGIOGRAPHY;  Surgeon: Wellington Hampshire, MD;  Location: [place] CV LAB;  Service: Cardiovascular;  Laterality: Left;  . LEFT HEART CATH AND CORONARY ANGIOGRAPHY N/A 11/07/2017   Procedure: LEFT HEART CATH AND CORONARY ANGIOGRAPHY;  Surgeon: Wellington Hampshire, MD;  Location: [place] CV LAB;  Service: Cardiovascular;  Laterality: N/A;  .  ROTATOR CUFF REPAIR Right 2013    Allergies  Allergies  Allergen Reactions  . Codeine Hives  . Shellfish Allergy Hives and Swelling  . Contrast Media [Iodinated Diagnostic Agents] Itching and Other (See Comments)    Whelts on tongue  . Fish Allergy Itching    History of Present Illness    Todd Weaver is a 69 y.o.  male with a hx of  hx of CAD s/p inferior STEMI complicated by ventricular fibrillation arrest 06/2017 treated with PCI/DESx2 to RCA with residual significant ostial LAD stenosis, borderline significant proximal LAD stenosis, moderate distal LAD stenosis he had subsequent IVUS guided DES placement to proximal/ostial LAD [id number], chronic systolic heart failure, postoperative atrial fibrillation treated with amiodarone, previous CVA, HLD, previous tobacco use, BPH, PAD   last seen 03/10/2019 by Dr. Fletcher Anon.  Most recent catheterization 10/2017.  Reviewed cardiac catheterization with widely patent LAD and RCA stents with no significant restenosis.  [place]amus branch was pinched by the LAD stent was 70% ostial stenosis.  EF 50-55%.  His PAD is with mild bilateral calf claudication. LE duplex and ABI performed 01/2019. Symptoms were minimal last office visit and was recommended for continued medical therapy.  Went to the ED with left arm pain this morning. He was worried as it felt similar to previous anginal equivalent. Troponin negative x2. CXR without acute findings. EKG unremarkable. He was discharged home and did not require intervention.   Subsequently left the ED around 12p or 1p. Then was scheduled for follow up today. Unclear why follow up was scheduled for same day.   Woke up with left upper arm pain this morning.  Did significant amount of blood work and chopping avoid yesterday.  Took 4 baby aspirin.  Took 1 nitroglycerin.  Woke his wife up and went to the ED. Had eased off by the time he got to the ED. Checked BP 167/80 during episode.  We discussed that this likely was due to stress and pain.  BP at home typically 130s when checked.  Tells me he gets lightheaded. Noticed when he is sitting.  Does not seem to occur with position changes.  EKGs/Labs/Other Studies Reviewed:   The following studies were reviewed today:  EKG:  EKG is ordered today.  The ekg ordered today demonstrates SR left axis  devisation and minimal voltage criteria for LVH. No acute ST/T wave changes  Recent Labs: 08/14/2019: ALT 30; BUN 14; Creatinine, Ser 0.98; Hemoglobin 13.8; Platelets 254; Potassium 3.8; Sodium 138  Recent Lipid Panel    Component Value Date/Time   CHOL 168 03/31/2019 1117   TRIG 62 03/31/2019 1117   HDL 70 03/31/2019 1117   CHOLHDL 2.4 03/31/2019 1117   CHOLHDL 2.8 11/07/2017 0511   VLDL 4 11/07/2017 0511   LDLCALC 86 03/31/2019 1117   LDLDIRECT 116 (H) 09/24/2017 1338    Home Medications   Current Meds  Medication Sig  . aspirin EC 81 MG tablet Take 81 mg by mouth daily.   Marland Kitchen atorvastatin (LIPITOR) 80 MG tablet TAKE 1 TABLET EVERY DAY AT 6:00PM  . clopidogrel (PLAVIX) 75 MG tablet Take 1 tablet (75 mg total) by mouth daily.  Marland Kitchen ezetimibe (ZETIA) 10 MG tablet Take 1 tablet (10 mg total) by mouth daily.  . furosemide (LASIX) 20 MG tablet Take 1 tablet (20 mg total) by mouth daily.  Marland Kitchen gabapentin (NEURONTIN) 100 MG capsule Take 1 capsule (100 mg total) by mouth at bedtime.  Marland Kitchen losartan (COZAAR) 25 MG tablet Take  1 tablet (25 mg total) by mouth daily.  . metoprolol succinate (TOPROL-XL) 25 MG 24 hr tablet Take 0.5 tablets (12.5 mg total) by mouth daily.  . nitroGLYCERIN (NITROSTAT) 0.4 MG SL tablet Place 1 tablet (0.4 mg total) under the tongue every 5 (five) minutes as needed for chest pain.  . potassium chloride (K-DUR) 10 MEQ tablet Take 1 tablet (10 mEq total) by mouth daily.  . silver sulfADIAZINE (SILVADENE) 1 % cream Apply 1 application topically daily. To burn on abdomen  . tamsulosin (FLOMAX) 0.4 MG CAPS capsule Take 1 capsule (0.4 mg total) by mouth daily.    Review of Systems      Review of Systems  Constitution: Negative for chills, fever and malaise/fatigue.  Cardiovascular: Positive for dyspnea on exertion. Negative for chest pain, leg swelling, near-syncope, orthopnea, palpitations and syncope.  Respiratory: Negative for cough, shortness of breath and wheezing.     Musculoskeletal:       (+) L arm pain  Gastrointestinal: Negative for nausea and vomiting.  Neurological: Positive for light-headedness. Negative for dizziness and weakness.   All other systems reviewed and are otherwise negative except as noted above.  Physical Exam    VS:  BP 130/76 (BP Location: Left Arm, Patient Position: Sitting, Cuff Size: Normal)   Pulse 64   Ht 5\' 7"  (1.702 m)   Wt 186 lb (84.4 kg)   SpO2 98%   BMI 29.13 kg/m  , BMI Body mass index is 29.13 kg/m. GEN: Well nourished, overweight, well developed, in no acute distress. HEENT: normal. Neck: Supple, no JVD, carotid bruits, or masses. Cardiac: RRR, no murmurs, rubs, or gallops. No clubbing, cyanosis, edema.  Radials/DP/PT 2+ and equal bilaterally.  Respiratory:  Respirations regular and unlabored, clear to auscultation bilaterally. GI: Soft, nontender, nondistended, BS + x 4. MS: No deformity or atrophy. L deltoid and L arm tender on palpation.  Skin: Warm and dry, no rash. Neuro:  Strength and sensation are intact. Psych: Normal affect.   Assessment & Plan    1. CAD - Hx of DES to LAD, RCA as above. Last cath 10/2017 with recommendation for medical management. Evaluated in ED this morning for left arm pain.  Took aspirin &1 nitroglycerin at home.  No pain in ED.  Felt similar to previous anginal equivalent.  EKG in ED & office today, no acute ST/T wave changes.  CXR unrevealing.  Troponin negative x2.  BMP, CBC unremarkable.  Likely musculoskeletal due to chopping wood yesterday.  Left upper arm and left deltoid tender on palpation. Has noted increased DOE over the last few months.   Though present episode atypical for angina his DOE is of concern. He is very worried. His brother in law is a cardiologist in another state and suggested a Myoview. Patient is very interested. Plan for [facility].   Educated that he could take up to 3 nitroglycerin at home prior to presenting to ED.  GDMT aspirin, plavix,  lipitor, beta blocker, PRN nitroglycerin  2. HTN -BP well controlled continue present antihypertensive regimen  3. HLD, LDL goal <70 - 03/2019 LDL 86. Continue Atorvastatin 80mg  and Zetia 10mg . Lipid lowering diet recommended. Consider repeat lipid profile at next office visit at LDL >70. May benefit from [facility].  4. PAD - Minimal symptoms of claudication.  Continue medication management.  5. Lightheadedness -reports he intermittently gets lightheaded when sitting down.  Noted exacerbating or relieving factors.  Encourage adequate hydration, regular meals, careful position changes.  Could consider  repeat carotid duplex in the future if problem persists.   6. Chronic systolic HF - Most recent EF 50-55%. Euvolemic on exam. Low sodium diet recommended.   Disposition: Lexiscan Myoview. Follow up in 1 month(s) with Dr. Fletcher Anon or APP with lipid profile and consideration of carotid duplex.  Loel Dubonnet, NP 08/14/2019, 3:55 PM

## 2019-08-24 ENCOUNTER — Other Ambulatory Visit: Payer: Self-pay

## 2019-08-24 ENCOUNTER — Ambulatory Visit
Admission: RE | Admit: 2019-08-24 | Discharge: 2019-08-24 | Disposition: A | Discharge status: Home / Self Care | Payer: Medicare HMO | Source: Ambulatory Visit | Attending: Family | Admitting: Family

## 2019-08-24 DIAGNOSIS — R079 Chest pain, unspecified: Secondary | ICD-10-CM | POA: Diagnosis not present

## 2019-08-24 LAB — NM MYOCAR MULTI W/SPECT W/WALL MOTION / EF
Estimated workload: 1 METS
Exercise duration (min): 0 min
Exercise duration (sec): 0 s
LV dias vol: 93 mL (ref 62–150)
LV sys vol: 40 mL
MPHR: 154 {beats}/min
Peak HR: 85 {beats}/min
Percent HR: 55 %
Rest HR: 56 {beats}/min
SDS: 0
SRS: 0
SSS: 0
TID: 0.9

## 2019-08-24 MED ORDER — TECHNETIUM TC 99M TETROFOSMIN IV KIT
10.0000 | PACK | Freq: Once | INTRAVENOUS | Status: AC | PRN
  Administered 2019-08-24: 08:00:00 10.46 via INTRAVENOUS

## 2019-08-24 MED ORDER — TECHNETIUM TC 99M TETROFOSMIN IV KIT
32.0700 | PACK | Freq: Once | INTRAVENOUS | Status: AC | PRN
  Administered 2019-08-24: 32.07 via INTRAVENOUS

## 2019-08-24 MED ORDER — REGADENOSON 0.4 MG/5ML IV SOLN
0.4000 mg | Freq: Once | INTRAVENOUS | Status: AC
  Administered 2019-08-24: 10:00:00 0.4 mg via INTRAVENOUS

## 2019-08-26 ENCOUNTER — Telehealth: Payer: Self-pay

## 2019-08-26 NOTE — Telephone Encounter (Signed)
-----   Message from Loel Dubonnet, NP sent at 08/24/2019  4:11 PM EST ----- Stress test with no evidence of blockages. Shows normal pumping function. Good result!

## 2019-08-26 NOTE — Telephone Encounter (Signed)
Call to patient to review stress test results.   No further orders at this time.  Confirmed upcoming appt later this month.   Advised pt to call for any further questions or concerns.

## 2019-09-05 ENCOUNTER — Other Ambulatory Visit: Payer: Self-pay | Admitting: Internal Medicine

## 2019-09-05 DIAGNOSIS — E785 Hyperlipidemia, unspecified: Secondary | ICD-10-CM

## 2019-09-14 ENCOUNTER — Ambulatory Visit: Payer: Medicare HMO | Admitting: Family

## 2019-09-14 NOTE — Progress Notes (Deleted)
Office Visit    Patient Name: Todd Weaver Date of Encounter: 09/14/2019  Primary Care Provider:  Glean Hess, MD Primary Cardiologist:  Kathlyn Sacramento, MD Electrophysiologist:  None   Chief Complaint    Todd Weaver is a 69 y.o. male with a hx of hx of CAD s/p inferior STEMI complicated by ventricular fibrillation arrest 06/2017 treated with PCI in 2 DES to RCA, chronic systolic heart failure, postoperative atrial fibrillation treated with amiodarone, previous CVA, HLD, previous tobacco use, BPH, PAD presents today for follow up after Lexiscan.   Past Medical History    Past Medical History:  Diagnosis Date  . Acute ST elevation myocardial infarction (STEMI) of inferior wall ([place]) 07/18/2017  . CAD (coronary artery disease)    a. 03/2016 MV: EF 55-65%, low risk; b. 06/2017 Inf STEMI: LM 85d, LAD 60p, 32m, LCX 40p, RCA 99ost (3.25x18 Xience [place] DES/3.0x38 [place] DES), RPAV 60; c. 07/2017 Cath: LM 85d, LAD 85ost, 60p, 52m, RI 50ost, LCX 50p, RCA patent stent, RPAD/RPAV min irregs; c. 08/2017 PCI dLM/LAD (3.0x38 Xience [place] DES); d. 10/2017 Cath: LM/prox LAD patent, LAD 34m, 40d, RI 70, LCX 40p, RCA patent stent. EF 50-55  . Contrast media allergy   . GERD (gastroesophageal reflux disease)   . Hemorrhoid   . Hyperlipidemia   . Hypertension   . Ischemic cardiomyopathy    a. 06/2017 Echo: EF 45-50%, mod inf/infsept HK. Mildly dil LA.  Marland Kitchen PAF (paroxysmal atrial fibrillation) ([place])    a. Brief episode during December hospitalization-->converted on IV amio-->not on [place].  Marland Kitchen Stroke [facility]) 2011   "2 MINI STROKES". no deficits.  Marland Kitchen Unstable angina [facility])    Past Surgical History:  Procedure Laterality Date  . ANAL FISTULECTOMY N/A 12/21/2014   Procedure: FISTULECTOMY ANAL;  Surgeon: Christene Lye, MD;  Location: [facility] ORS;  Service: General;  Laterality: N/A;  . APPENDECTOMY  1975  . COLONOSCOPY WITH PROPOFOL N/A 02/12/2019   Procedure: COLONOSCOPY WITH PROPOFOL;   Surgeon: Lucilla Lame, MD;  Location: [facility] ENDOSCOPY;  Service: Endoscopy;  Laterality: N/A;  . CORONARY STENT INTERVENTION N/A 09/09/2017   Procedure: CORONARY STENT INTERVENTION;  Surgeon: Wellington Hampshire, MD;  Location: [place] CV LAB;  Service: Cardiovascular;  Laterality: N/A;  . CORONARY/GRAFT ACUTE MI REVASCULARIZATION N/A 07/18/2017   Procedure: Coronary/Graft Acute MI Revascularization;  Surgeon: Wellington Hampshire, MD;  Location: [place] CV LAB;  Service: Cardiovascular;  Laterality: N/A;  . HEMORRHOID SURGERY N/A 12/21/2014   Procedure: HEMORRHOIDECTOMY;  Surgeon: Christene Lye, MD;  Location: [facility] ORS;  Service: General;  Laterality: N/A;  . INTRAVASCULAR ULTRASOUND/IVUS N/A 09/09/2017   Procedure: Intravascular Ultrasound/IVUS;  Surgeon: Wellington Hampshire, MD;  Location: [place] CV LAB;  Service: Cardiovascular;  Laterality: N/A;  . KNEE SURGERY Right age 4  . LEFT HEART CATH AND CORONARY ANGIOGRAPHY N/A 07/18/2017   Procedure: LEFT HEART CATH AND CORONARY ANGIOGRAPHY;  Surgeon: Wellington Hampshire, MD;  Location: [employer] CV LAB;  Service: Cardiovascular;  Laterality: N/A;  . LEFT HEART CATH AND CORONARY ANGIOGRAPHY Left 08/19/2017   Procedure: LEFT HEART CATH AND CORONARY ANGIOGRAPHY;  Surgeon: Wellington Hampshire, MD;  Location: [place] CV LAB;  Service: Cardiovascular;  Laterality: Left;  . LEFT HEART CATH AND CORONARY ANGIOGRAPHY N/A 11/07/2017   Procedure: LEFT HEART CATH AND CORONARY ANGIOGRAPHY;  Surgeon: Wellington Hampshire, MD;  Location: [place] CV LAB;  Service: Cardiovascular;  Laterality: N/A;  . ROTATOR CUFF  REPAIR Right 2013    Allergies  Allergies  Allergen Reactions  . Codeine Hives  . Shellfish Allergy Hives and Swelling  . Contrast Media [Iodinated Diagnostic Agents] Itching and Other (See Comments)    Whelts on tongue  . Fish Allergy Itching    History of Present Illness    Todd Weaver is a 69 y.o. male with a hx  of  hx of CAD s/p inferior STEMI complicated by ventricular fibrillation arrest 06/2017 treated with PCI/DESx2 to RCA with residual significant ostial LAD stenosis, borderline significant proximal LAD stenosis, moderate distal LAD stenosis he had subsequent IVUS guided DES placement to proximal/ostial LAD [id number], chronic systolic heart failure, postoperative atrial fibrillation treated with amiodarone, previous CVA, HLD, previous tobacco use, BPH, PAD. He was last seen 08/14/19.  Cardiac cath 10/2017 with widely patent LAD and RCA stents with no significant restenosis. Ramus branch pinched by LAD stent with 70% ostial stenosis. EF 50-55%.   His PAD is with mild bilateral calf claudication. LE duplex and ABI performed 01/2019. Symptoms were minimal last office visit and was recommended for continued medical therapy.   Went to the ED with left arm pain 08/14/19. He was worried as it felt similar to previous anginal equivalent. Troponin negative x2. CXR without acute findings. EKG unremarkable. He was discharged home and did not require intervention. Did not chopping wood the day before. Subsequently left the ED around 12p or 1p. Then was seen in office that afternoon. He was recommended for [facility].  Subsequent Lexiscan Myoview 08/24/19   ***  EKGs/Labs/Other Studies Reviewed:   The following studies were reviewed today: Lexiscan Myoview 08/24/19 There was no ST segment deviation noted during stress. The study is normal. This is a low risk study. The left ventricular ejection fraction is normal (55-65%).  EKG:  EKG is ordered today.  The ekg ordered today demonstrates ***  Recent Labs: 08/14/2019: ALT 30; BUN 14; Creatinine, Ser 0.98; Hemoglobin 13.8; Platelets 254; Potassium 3.8; Sodium 138  Recent Lipid Panel    Component Value Date/Time   CHOL 168 03/31/2019 1117   TRIG 62 03/31/2019 1117   HDL 70 03/31/2019 1117   CHOLHDL 2.4 03/31/2019 1117   CHOLHDL 2.8 11/07/2017 0511   VLDL 4  11/07/2017 0511   LDLCALC 86 03/31/2019 1117   LDLDIRECT 116 (H) 09/24/2017 1338    Home Medications   No outpatient medications have been marked as taking for the 09/14/19 encounter (Appointment) with Loel Dubonnet, NP.      Review of Systems    ***   ROS All other systems reviewed and are otherwise negative except as noted above.  Physical Exam    VS:  There were no vitals taken for this visit. , BMI There is no height or weight on file to calculate BMI. GEN: Well nourished, well developed, in no acute distress. HEENT: normal. Neck: Supple, no JVD, carotid bruits, or masses. Cardiac: ***RRR, no murmurs, rubs, or gallops. No clubbing, cyanosis, edema.  ***Radials/DP/PT 2+ and equal bilaterally.  Respiratory:  ***Respirations regular and unlabored, clear to auscultation bilaterally. GI: Soft, nontender, nondistended, BS + x 4. MS: No deformity or atrophy. Skin: Warm and dry, no rash. Neuro:  Strength and sensation are intact. Psych: Normal affect.  Accessory Clinical Findings    ECG personally reviewed by me today - *** - no acute changes.  Assessment & Plan    1. CAD - s/p DES to LAD, RCA. Cath 10/2017 with recommendation for medical  management. Lexicscan Myoview 08/24/19 low risk study.  2. HTN -  3. HLD, LDL goal <70 -  4. PAD -  5. Chronic systolic HF - Most recent EF 50-55%.   Disposition: Follow up {follow up:[id number]} with ***   Loel Dubonnet, NP 09/14/2019, 1:15 PM

## 2019-09-16 NOTE — Progress Notes (Signed)
Office Visit    Patient Name: Todd Weaver Date of Encounter: 09/17/2019  Primary Care Provider:  Glean Hess, MD Primary Cardiologist:  Kathlyn Sacramento, MD  Chief Complaint    69 yo male with history of CAD s/p inferior STEMI complicated by ventricular fibrillation arrest 06/2017 treated with PCI with DES x2 to RCA, chronic HFrEF, postoperative atrial fibrillation treated with amiodarone, previous CVA, HLD, previous tobacco use, BPH, PAD, and who presents today for 1 month follow-up of arm pain.  Past Medical History    Past Medical History:  Diagnosis Date   Acute ST elevation myocardial infarction (STEMI) of inferior wall ([place]) 07/18/2017   CAD (coronary artery disease)    a. 03/2016 MV: EF 55-65%, low risk; b. 06/2017 Inf STEMI: LM 85d, LAD 60p, 80m, LCX 40p, RCA 99ost (3.25x18 [employer]ience [place] DES/3.0x38 [place] DES), RPAV 60; c. 07/2017 Cath: LM 85d, LAD 85ost, 60p, 71m, RI 50ost, LCX 50p, RCA patent stent, RPAD/RPAV min irregs; c. 08/2017 PCI dLM/LAD (3.0x38 Xience [place] DES); d. 10/2017 Cath: LM/prox LAD patent, LAD 67m, 40d, RI 70, LCX 40p, RCA patent stent. EF 50-55   Contrast media allergy    GERD (gastroesophageal reflux disease)    Hemorrhoid    Hyperlipidemia    Hypertension    Ischemic cardiomyopathy    a. 06/2017 Echo: EF 45-50%, mod inf/infsept HK. Mildly dil LA.   PAF (paroxysmal atrial fibrillation) ([place])    a. Brief episode during December hospitalization-->converted on IV amio-->not on [place].   Stroke [facility]) 2011   "2 MINI STROKES". no deficits.   Unstable angina [facility])    Past Surgical History:  Procedure Laterality Date   ANAL FISTULECTOMY N/A 12/21/2014   Procedure: FISTULECTOMY ANAL;  Surgeon: Christene Lye, MD;  Location: [facility] ORS;  Service: General;  Laterality: N/A;   APPENDECTOMY  1975   COLONOSCOPY WITH PROPOFOL N/A 02/12/2019   Procedure: COLONOSCOPY WITH PROPOFOL;  Surgeon: Lucilla Lame, MD;  Location: [facility] ENDOSCOPY;   Service: Endoscopy;  Laterality: N/A;   CORONARY STENT INTERVENTION N/A 09/09/2017   Procedure: CORONARY STENT INTERVENTION;  Surgeon: Wellington Hampshire, MD;  Location: [place] CV LAB;  Service: Cardiovascular;  Laterality: N/A;   CORONARY/GRAFT ACUTE MI REVASCULARIZATION N/A 07/18/2017   Procedure: Coronary/Graft Acute MI Revascularization;  Surgeon: Wellington Hampshire, MD;  Location: [place] CV LAB;  Service: Cardiovascular;  Laterality: N/A;   HEMORRHOID SURGERY N/A 12/21/2014   Procedure: HEMORRHOIDECTOMY;  Surgeon: Christene Lye, MD;  Location: [facility] ORS;  Service: General;  Laterality: N/A;   INTRAVASCULAR ULTRASOUND/IVUS N/A 09/09/2017   Procedure: Intravascular Ultrasound/IVUS;  Surgeon: Wellington Hampshire, MD;  Location: [place] CV LAB;  Service: Cardiovascular;  Laterality: N/A;   KNEE SURGERY Right age 44   LEFT HEART CATH AND CORONARY ANGIOGRAPHY N/A 07/18/2017   Procedure: LEFT HEART CATH AND CORONARY ANGIOGRAPHY;  Surgeon: Wellington Hampshire, MD;  Location: [place] CV LAB;  Service: Cardiovascular;  Laterality: N/A;   LEFT HEART CATH AND CORONARY ANGIOGRAPHY Left 08/19/2017   Procedure: LEFT HEART CATH AND CORONARY ANGIOGRAPHY;  Surgeon: Wellington Hampshire, MD;  Location: [place] CV LAB;  Service: Cardiovascular;  Laterality: Left;   LEFT HEART CATH AND CORONARY ANGIOGRAPHY N/A 11/07/2017   Procedure: LEFT HEART CATH AND CORONARY ANGIOGRAPHY;  Surgeon: Wellington Hampshire, MD;  Location: [place] CV LAB;  Service: Cardiovascular;  Laterality: N/A;   ROTATOR CUFF REPAIR Right 2013    Allergies  Allergies  Allergen  Reactions   Codeine Hives   Shellfish Allergy Hives and Swelling   Contrast Media [Iodinated Diagnostic Agents] Itching and Other (See Comments)    Whelts on tongue   Fish Allergy Itching    History of Present Illness    [name] is a 69 yo male with PMH as above. In 2018, he suffered an inferior STEMI, complicated  by VF arrest and treated with DES x2 to RCA with residual significant ostial LAD stenosis, borderline significant proximal LAD stenosis, moderate dLAD stenosis. He had subsequent IVUS guided DES placement to p/oLAD 08/2017. His most recent cardiac catheterization was 10/2017 with widely patent LAD and RCA stents and no significant restenosis. Ramus branch was pinched by the LAD stent. 70% ostial stenosis. EF 50-55%. He also has PAD with mild bilateral calf claudication. LE duplex and ABI performed 01/2019.    On 08/14/2019, he went to the [facility] ED with L arm pain and concern that it felt similar to his previous anginal equivalent. He reportedly had woken up earlier that morning with L upper arm pain and after doing a significant amount of chopping wood. He then took 4 baby ASA and 1 SL nitro and presented to the ED. By the time he reached the ED, his CP had improved. BP 167/80 with reported typical home BP 130s. HS Tn negative x2 and workup unremarkable. He was scheduled for follow-up in the office same day and seen later that afternoon in the office. During his office visit, he also mentioned feeling lightheaded at times when seated but denied a positional component. He was scheduled for a Lexiscan that was ruled low risk with no signs of ischemia and normal EF.   Since that time, he reports that his left arm pain has resolved. He has noted increasing dyspnea on exertion and shortness of breath, however. In addition, he continues to experience dizziness. He reports that this dizziness occurs with both exertion and rest. For example, he felt dizzy on his way into his appointment today. He also noted that he felt dizzy and in the car the other day and while driving. He reports that this dizziness resolved shortly thereafter. No clear aggravating or alleviating symptoms. No amaurosis fugax though he did feel he may have had a floater the other day. He denies any associated chest pain. No racing heart rate or  palpitations. No syncope. He denies any lower extremity edema, PND, orthopnea, or early satiety. He does report recent weight gain, which he feels may be related to his shortness of breath. In addition, he reports abdominal distention; however, he is uncertain if this is related to his beer consumption. He reports drinking approximately 3-4 beers daily and will attempt to cut back on these. He reports a past history of smoking, having quit approximately 15 years ago and not restarted. He admits to occasional fried foods; however, he is willing to cut back on these foods with Mediterranean diet discussed. He is not adding salt to his food. He reports consuming lots of water and fluids throughout the day with recommendation to keep fluids under 2 L. Blood pressure today 142/80 with heart rate 53 bpm and weight 189 pounds. Of note, when asked if his latest symptoms are similar to that as before his cardiac catheterization, he reports that he feels they are different as he did not get nauseated or sweaty. Before his 2019 caths, he had SOB/DOE, L arm pain, diaphoresis, and nausea. We discussed his physical activity today, as he is eager  to get back to the gym and possibly start swimming. He reports that he continues to chop wood with the splitter, although has started to feel short of breath with this activity. In addition, he continues to go rabbit hunting with his friends, walking up to 8 miles daily; however, he occasionally gets short of breath and needs to rest with this activity as well. No signs or symptoms consistent with bleeding. No claudication symptoms reported.  Home Medications    Prior to Admission medications   Medication Sig Start Date End Date Taking? Authorizing Provider  aspirin EC 81 MG tablet Take 81 mg by mouth daily.  05/23/09   [provider]  atorvastatin (LIPITOR) 80 MG tablet TAKE 1 TABLET EVERY DAY AT 6:00PM 02/25/19   Glean Hess, MD  clopidogrel (PLAVIX) 75 MG tablet  Take 1 tablet (75 mg total) by mouth daily. 02/25/19   Glean Hess, MD  ezetimibe (ZETIA) 10 MG tablet TAKE 1 TABLET (10 MG TOTAL) BY MOUTH DAILY. 09/05/19   Glean Hess, MD  furosemide (LASIX) 20 MG tablet Take 1 tablet (20 mg total) by mouth daily. 02/25/19   Glean Hess, MD  losartan (COZAAR) 25 MG tablet Take 1 tablet (25 mg total) by mouth daily. 02/25/19   Glean Hess, MD  metoprolol succinate (TOPROL-XL) 25 MG 24 hr tablet Take 0.5 tablets (12.5 mg total) by mouth daily. 02/25/19   Glean Hess, MD  nitroGLYCERIN (NITROSTAT) 0.4 MG SL tablet Place 1 tablet (0.4 mg total) under the tongue every 5 (five) minutes as needed for chest pain. 02/25/19   Glean Hess, MD  potassium chloride (K-DUR) 10 MEQ tablet Take 1 tablet (10 mEq total) by mouth daily. 02/25/19   Glean Hess, MD  silver sulfADIAZINE (SILVADENE) 1 % cream Apply 1 application topically daily. To burn on abdomen 07/08/19   Glean Hess, MD  tamsulosin [facility]) 0.4 MG CAPS capsule Take 1 capsule (0.4 mg total) by mouth daily. 02/25/19   Glean Hess, MD    Review of Systems    He denies chest pain, palpitations, pnd, orthopnea, n, v, syncope, edema, or early satiety. He reports dyspnea, shortness of breath, abdominal distention, weight gain, and dizziness. all other systems reviewed and are otherwise negative except as noted above.  Physical Exam    VS:  BP (!) 142/80 (BP Location: Left Arm, Patient Position: Sitting, Cuff Size: Normal)    Pulse (!) 53    Ht 5\' 7"  (1.702 m)    Wt 189 lb 8 oz (86 kg)    SpO2 98%    BMI 29.68 kg/m  , BMI Body mass index is 29.68 kg/m. GEN: Well nourished, well developed, in no acute distress. HEENT: normal. Neck: Supple, no JVD, carotid bruits, or masses. Cardiac: bradycardic but regular. No murmurs, rubs, or gallops. No clubbing, cyanosis, or significant edema.  Radials/DP/PT 2+ and equal bilaterally.  Respiratory:  Respirations regular and unlabored, clear to  auscultation bilaterally. GI: Slightly distended abdomen but not TTP, BS + x 4. MS: no deformity or atrophy. Skin: warm and dry, no rash. Neuro:  Strength and sensation are intact. Psych: Normal affect.  Accessory Clinical Findings    ECG personally reviewed by me today - Sinus bradycardia 53bpm, TWI in inferior leads / possible inferior ischemia versus repolarization abnormalities - no acute changes.  VITALS Reviewed   Temp Readings from Last 3 Encounters:  08/14/19 97.6 F (36.4 C) (Oral)  02/12/19 Marland Kitchen)  96.8 F (36 C)  11/07/17 98.6 F (37 C) (Oral)   BP Readings from Last 3 Encounters:  09/17/19 (!) 142/80  08/14/19 130/76  08/14/19 (!) 158/76   Pulse Readings from Last 3 Encounters:  09/17/19 (!) 53  08/14/19 64  08/14/19 (!) 50    Wt Readings from Last 3 Encounters:  09/17/19 189 lb 8 oz (86 kg)  08/14/19 186 lb (84.4 kg)  08/14/19 190 lb (86.2 kg)     LABS  reviewed   CareEverwhere Labs Present? Yes/No: No   LABS 08/14/19: Na 138, K 3.8, Cr 0.98, BUN 14, Hgb 13.8, HCT 40.3, plts 254 07/19/17: TSH 1.232  11/06/17: A1C 5.8  Lab Results  Component Value Date   WBC 6.0 08/14/2019   HGB 13.8 08/14/2019   HCT 40.3 08/14/2019   MCV 83.3 08/14/2019   PLT 254 08/14/2019   Lab Results  Component Value Date   CREATININE 0.98 08/14/2019   BUN 14 08/14/2019   NA 138 08/14/2019   K 3.8 08/14/2019   CL 105 08/14/2019   CO2 24 08/14/2019   Lab Results  Component Value Date   ALT 30 08/14/2019   AST 31 08/14/2019   ALKPHOS 47 08/14/2019   BILITOT 1.4 (H) 08/14/2019   Lab Results  Component Value Date   CHOL 168 03/31/2019   HDL 70 03/31/2019   LDLCALC 86 03/31/2019   LDLDIRECT 116 (H) 09/24/2017   TRIG 62 03/31/2019   CHOLHDL 2.4 03/31/2019    Lab Results  Component Value Date   HGBA1C 5.8 (H) 11/06/2017   Lab Results  Component Value Date   TSH 1.232 07/19/2017     STUDIES/PROCEDURES reviewed    NM Lexiscan 08/24/19  There was no ST segment  deviation noted during stress.  The study is normal.  This is a low risk study.  The left ventricular ejection fraction is normal (55-65%).  Vascular US 01/2019 SummaryVascular [place] 01/2019:  Right:  Resting right ankle-brachial index indicates moderate right lower  extremity arterial disease.  The right toe-brachial index is abnormal.  Left:  Resting left ankle-brachial index indicates moderate left lower extremity  arterial disease.  The left toe-brachial index is normal.   LHC 11/07/2017  Mid LAD lesion is 30% stenosed.  Previously placed Prox LAD drug eluting stent is widely patent.  Prox Cx lesion is 40% stenosed.  Non-stenotic Ost RCA to Mid RCA lesion previously treated.  Ost Ramus lesion is 70% stenosed.  Dist LAD lesion is 40% stenosed.  LV end diastolic pressure is mildly elevated.  There is mild left ventricular systolic dysfunction.  The left ventricular ejection fraction is 50-55% by visual estimate. 1.  Widely patent LAD and RCA stents with no significant restenosis.  There is a relatively small size ramus br which is pinched by the LAD stent with 70% ostial stenosis.  No other obstructive disease. 2.  Low normal LV systolic function with an EF of 50-55% with mild inferior wall hypokinesis.  Mildly elevated left ventricular end-diastolic pressure. Recommendations: Continue medical therapy.  Add sublingual nitroglycerin to be used as needed. The patient can be discharged home from a cardiac standpoint.  Echo 06/2017 - Left ventricle: The cavity size was normal. There was mild  concentric hypertrophy. Systolic function was mildly reduced. The  estimated ejection fraction was in the range of 45% to 50%.  Moderate hypokinesis of the inferior and inferoseptal myocardium.  The study was not technically sufficient to allow evaluation of  LV diastolic  dysfunction due to atrial fibrillation.  - Left atrium: The atrium was mildly dilated.   09/2005 Carotid  US IMPRESSION:  1.     There is observed slight plaque formation bilaterally.  2.     No hemodynamically significant stenosis is seen on either side.  3.     Antegrade flow is present in both vertebrals.   Assessment & Plan    CAD s/p inferior STEMI with PCI/DES, complicated by VF arrest --No chest pain. He has not needed his SL nitro.  He does report increasing DOE/SOB, abdominal distention, weight gain, and dizziness; moreover, he reports onset of sx after his visit to our office on 1/22/2021and that they are progressive. Reports improvement in left arm pain. States he feels s/sx similar to that before his inferior STEMI; however, with his STEMI, he reportedly also felt nausea and diaphoresis.   --08/24/19 Lexiscan performed for L arm pain at previous visit and ruled low risk as above.  2018 echo with EF 45 to 50% and findings as above including moderate hypokinesis.  Will update an echo to reassess EF, wall motion, valves / structure, and heart dz. Considered is DOE/SOB as his anginal equivalent and will discuss with interventional cardiology given sx and TWI. DOE/SOB could also be 2/2 volume overload as below. At RTC, consider cardiac monitoring as below as well, given this could also contribute to his sx.  --Continue DAPT with no s/sx of bleeding. Start Coreg 3.125mg  (discontinued Toprol XL as outlined below for bradycardia with elevated BP). Increase Losartan to 50mg  qd with BMET/Mg today and in 1 week. Continue high intensity statin and Zetia, along with PRN SL nitro. Risk factor modification recommended.  Acute on chronic HFrEF (2018 EF 40-45%) --Reports SOB/DOE, abdominal distention, weight gain, and dizziness. Slightly volume up on exam. Will update echo to rule out valvular dz and reassess EF and pressures, as well as wall motion. Obtain BMET/Mg today. If renal function stable, increase lasix x3 days then drop down to his usual dose of 20mg  daily. Repeat BMET, Mg in 1 week. Given bradycardia  with elevated BP, Toprol XL changed to Coreg 3.125mg  BID. Increased Losartan to 50mg  daily for extra BP support with BMET/Mg today and in 1 week. Recommendations regarding lasix and potassium supplementation changes pending labs. Consider ReDS vest at RTC. --Discussed low salt diet and Mediterranean diet, as well as fluid intake <2L daily. Discussed alcohol cessation, as well as avoiding fried foods. He is agreeable to daily weights, BP monitoring.  Recommend continue to stay active and work on dietary changes.   Postoperative atrial fibrillation, History of VF arrest --No racing HR or palpitations but does report ongoing dizziness as in HPI. Given ongoing dizziness reported with history of VF arrest and postoperative atrial fibrillation, discussed cardiac monitoring and with plan to revisit at follow-up if ongoing sx. Runs of tachycardia could contribute to current volume status as well. Not currently on [place] with CHA2DS2VASc score of at least 6 (CHF, HTN, agex1,strokex2, vascular). Currently bradycardic and rate controlled.    Bradycardia --Will stop Toprol and start Coreg 3.125mg  BID. Consider as contributing to dizziness.  Bradycardia has been noted in the past without reported sx.  Consider 2-week OP cardiac monitoring as above.  Previous CVA --Given history of atrial fibrillation, reassess obtaining outpatient 2 week cardiac monitoring at follow-up. He is not on any OAC as directly above.  HTN --BP poorly controlled today. Stopped Toprol XL and started Coreg 3.125mg  BID given bradycardia with elevated pressure.  Increased Losartan to 50mg  daily with BMET today and in 1 week. Consider also elevated pressures in the setting of mild volume overload as well. He will monitor pressure and weights at home.    HLD --Continue statin and Zetia. Reassess lipids and liver function. LDL goal <70.  If LDL not well controlled, consider referral to lipid clinic.   PAD --No s/sx consistent with worsening PAD.  Previous 01/2019 study as above. Continue DAPT and statin. Reassess sx and need for repeat LE arterial study/ABIs at RTC.  Carotid bilateral plaques (2007) --Agree with updated carotid study if dizziness continues and echo unrevealing. Previous study in 2007 with slight plaque bilaterally but no significant b/l stenosis. No report of amaurosis fugax though did note 1 episode of a floater.  No bruit heard on exam. Continue to monitor. Will reassess repeat carotid US at RTC.   Previous tobacco use, current alcohol use --Continued cessation advised for tobacco use. Discussed alcohol cessation with patient understanding.   ----  Medication changes: Stop Toprol-XL 25 mg.  Start Coreg 3.125 mg twice daily due to bradycardia and elevated pressure. Increase Losartan to 50mg  daily. Labs ordered: BMET/mag (now, 1 week), lipid and liver function Studies / Imaging ordered: Echo Disposition: RTC in 2 weeks.  Consider further work-up with carotid study, 2-week OP cardiac monitoring, and lower extremity arterial study/ABIs at follow-up.  Total time spent with patient today 45 minutes. This includes reviewing records, evaluating the patient, and coordinating care. Face-to-face time >50%.   Arvil Chaco, PA-C 09/17/2019, 10:27 AM

## 2019-09-17 ENCOUNTER — Other Ambulatory Visit: Payer: Self-pay

## 2019-09-17 ENCOUNTER — Encounter: Payer: Self-pay | Admitting: Physician Assistant

## 2019-09-17 ENCOUNTER — Ambulatory Visit (INDEPENDENT_AMBULATORY_CARE_PROVIDER_SITE_OTHER): Payer: Medicare HMO | Admitting: Physician Assistant

## 2019-09-17 VITALS — BP 142/80 | HR 53 | Ht 67.0 in | Wt 189.5 lb

## 2019-09-17 DIAGNOSIS — I739 Peripheral vascular disease, unspecified: Secondary | ICD-10-CM | POA: Diagnosis not present

## 2019-09-17 DIAGNOSIS — E785 Hyperlipidemia, unspecified: Secondary | ICD-10-CM

## 2019-09-17 DIAGNOSIS — I5023 Acute on chronic systolic (congestive) heart failure: Secondary | ICD-10-CM | POA: Diagnosis not present

## 2019-09-17 DIAGNOSIS — I25118 Atherosclerotic heart disease of native coronary artery with other forms of angina pectoris: Secondary | ICD-10-CM | POA: Diagnosis not present

## 2019-09-17 DIAGNOSIS — R0602 Shortness of breath: Secondary | ICD-10-CM

## 2019-09-17 DIAGNOSIS — I10 Essential (primary) hypertension: Secondary | ICD-10-CM

## 2019-09-17 DIAGNOSIS — F1090 Alcohol use, unspecified, uncomplicated: Secondary | ICD-10-CM

## 2019-09-17 DIAGNOSIS — Z789 Other specified health status: Secondary | ICD-10-CM

## 2019-09-17 DIAGNOSIS — Z8679 Personal history of other diseases of the circulatory system: Secondary | ICD-10-CM | POA: Diagnosis not present

## 2019-09-17 DIAGNOSIS — R42 Dizziness and giddiness: Secondary | ICD-10-CM | POA: Diagnosis not present

## 2019-09-17 DIAGNOSIS — Z7289 Other problems related to lifestyle: Secondary | ICD-10-CM

## 2019-09-17 DIAGNOSIS — R0600 Dyspnea, unspecified: Secondary | ICD-10-CM

## 2019-09-17 DIAGNOSIS — I2510 Atherosclerotic heart disease of native coronary artery without angina pectoris: Secondary | ICD-10-CM | POA: Diagnosis not present

## 2019-09-17 DIAGNOSIS — Z8673 Personal history of transient ischemic attack (TIA), and cerebral infarction without residual deficits: Secondary | ICD-10-CM

## 2019-09-17 DIAGNOSIS — Z87891 Personal history of nicotine dependence: Secondary | ICD-10-CM

## 2019-09-17 MED ORDER — CARVEDILOL 3.125 MG PO TABS: 3.1250 mg | ORAL_TABLET | Freq: Two times a day (BID) | ORAL | 3 refills | Status: DC

## 2019-09-17 MED ORDER — LOSARTAN POTASSIUM 50 MG PO TABS: 50.0000 mg | ORAL_TABLET | Freq: Every day | ORAL | 3 refills | Status: DC

## 2019-09-17 NOTE — Patient Instructions (Signed)
Medication Instructions:  1- STOP Toprol 2- START Coreg Take 1 tablet (3.125 mg total) by mouth 2 (two) times daily with a meal 3- INCREASE Losartan Take 1 tablet (50 mg total) by mouth daily *If you need a refill on your cardiac medications before your next appointment, please call your pharmacy*  Lab Work: Your physician recommends that you have lab work today(lipid with direct)  If you have labs (blood work) drawn today and your tests are completely normal, you will receive your results only by: Marland Kitchen MyChart Message (if you have MyChart) OR . A paper copy in the mail If you have any lab test that is abnormal or we need to change your treatment, we will call you to review the results.  Testing/Procedures: 1- Echo  Please return to [facility] on ______________ at _______________ AM/PM for an Echocardiogram. Your physician has requested that you have an echocardiogram. Echocardiography is a painless test that uses sound waves to create images of your heart. It provides your doctor with information about the size and shape of your heart and how well your heart's chambers and valves are working. This procedure takes approximately one hour. There are no restrictions for this procedure. Please note; depending on visual quality an IV may need to be placed.    Follow-Up: At [facility], you and your health needs are our priority.  As part of our continuing mission to provide you with exceptional heart care, we have created designated Provider Care Teams.  These Care Teams include your primary Cardiologist (physician) and Advanced Practice Providers (APPs -  Physician Assistants and Nurse Practitioners) who all work together to provide you with the care you need, when you need it.  Your next appointment:   2 week(s)  The format for your next appointment:   In Person  Provider:    You may see Kathlyn Sacramento, MD or Marrianne Mood, PA-C.

## 2019-09-18 LAB — LIPID PANEL
Chol/HDL Ratio: 2.4 ratio (ref 0.0–5.0)
Cholesterol, Total: 146 mg/dL (ref 100–199)
HDL: 60 mg/dL (ref >39)
LDL Chol Calc (NIH): 77 mg/dL (ref 0–99)
Triglycerides: 36 mg/dL (ref 0–149)
VLDL Cholesterol Cal: 9 mg/dL (ref 5–40)

## 2019-09-18 LAB — HEPATIC FUNCTION PANEL
ALT: 34 IU/L (ref 0–44)
AST: 31 IU/L (ref 0–40)
Albumin: 4.4 g/dL (ref 3.8–4.8)
Alkaline Phosphatase: 59 IU/L (ref 39–117)
Bilirubin Total: 1.1 mg/dL (ref 0.0–1.2)
Bilirubin, Direct: 0.3 mg/dL (ref 0.00–0.40)
Total Protein: 7.3 g/dL (ref 6.0–8.5)

## 2019-09-18 LAB — BASIC METABOLIC PANEL
BUN/Creatinine Ratio: 11 (ref 10–24)
BUN: 11 mg/dL (ref 8–27)
CO2: 24 mmol/L (ref 20–29)
Calcium: 9.4 mg/dL (ref 8.6–10.2)
Chloride: 105 mmol/L (ref 96–106)
Creatinine, Ser: 1.03 mg/dL (ref 0.76–1.27)
GFR calc Af Amer: 87 mL/min/{1.73_m2} (ref >59)
GFR calc non Af Amer: 75 mL/min/{1.73_m2} (ref >59)
Glucose: 104 mg/dL — ABNORMAL HIGH (ref 65–99)
Potassium: 4.6 mmol/L (ref 3.5–5.2)
Sodium: 141 mmol/L (ref 134–144)

## 2019-09-21 ENCOUNTER — Telehealth: Payer: Self-pay | Admitting: *Deleted

## 2019-09-21 DIAGNOSIS — I10 Essential (primary) hypertension: Secondary | ICD-10-CM

## 2019-09-21 DIAGNOSIS — I5022 Chronic systolic (congestive) heart failure: Secondary | ICD-10-CM

## 2019-09-21 DIAGNOSIS — Z79899 Other long term (current) drug therapy: Secondary | ICD-10-CM

## 2019-09-21 NOTE — Telephone Encounter (Signed)
-----   Message from Arvil Chaco, PA-C sent at 09/21/2019  6:56 AM EST ----- Please let Todd Weaver know that his baseline renal function is stable. His elevtrolytes are at goal. Please have him increase his lasix to 20mg  BID for 3 days (rather than his usual once a day) and potassium to 61mEq for those three days (rather than his usual 67mEq) then go back down to his usual lasix 20mg  once daily dose and Kcl tab 63mEq once daily. We will get another BMET/Mg in 1 week (may need ordered if not yet) to ensure stable renal function and electrolytes after this increase in Lasix and the start of his Losartan. Please also start him on lasix His liver function is within normal range. His total cholesterol is 146 with LDL 77 and goal LDL <70. Given his LDL is still above goal on both Zetia and high dose atorvastatin, please provide a referral for the lipid clinic in [place] if he is interested. We can also discuss this with him at his next visit.

## 2019-09-21 NOTE — Telephone Encounter (Signed)
Patient not home. Results called to patient's wife ok per DPR. She wrote down and read back the medication instructions. Patient has echo on 09/29/19 so patient will go to the [facility] on Tuesday for the lab work before or after the echo. She is aware of follow up appointment on 10/01/19 as well. She will share information with patient and have him call back if any further questions. We also reset his MyChart password so that they can access the results there as well. Results released to My Chart.

## 2019-09-29 ENCOUNTER — Other Ambulatory Visit: Payer: Self-pay

## 2019-09-29 ENCOUNTER — Ambulatory Visit (INDEPENDENT_AMBULATORY_CARE_PROVIDER_SITE_OTHER): Payer: Medicare HMO

## 2019-09-29 DIAGNOSIS — I5189 Other ill-defined heart diseases: Secondary | ICD-10-CM

## 2019-09-29 DIAGNOSIS — R0600 Dyspnea, unspecified: Secondary | ICD-10-CM

## 2019-09-29 DIAGNOSIS — I5022 Chronic systolic (congestive) heart failure: Secondary | ICD-10-CM

## 2019-09-29 HISTORY — DX: Other ill-defined heart diseases: I51.89

## 2019-09-29 HISTORY — DX: Chronic systolic (congestive) heart failure: I50.22

## 2019-10-01 ENCOUNTER — Ambulatory Visit (INDEPENDENT_AMBULATORY_CARE_PROVIDER_SITE_OTHER): Payer: Medicare HMO | Admitting: Nurse Practitioner

## 2019-10-01 ENCOUNTER — Other Ambulatory Visit: Payer: Self-pay

## 2019-10-01 ENCOUNTER — Ambulatory Visit: Payer: Medicare HMO | Attending: Internal Medicine

## 2019-10-01 ENCOUNTER — Encounter: Payer: Self-pay | Admitting: Nurse Practitioner

## 2019-10-01 VITALS — BP 140/84 | HR 51 | Ht 67.0 in | Wt 188.8 lb

## 2019-10-01 DIAGNOSIS — I739 Peripheral vascular disease, unspecified: Secondary | ICD-10-CM | POA: Diagnosis not present

## 2019-10-01 DIAGNOSIS — I5032 Chronic diastolic (congestive) heart failure: Secondary | ICD-10-CM

## 2019-10-01 DIAGNOSIS — E785 Hyperlipidemia, unspecified: Secondary | ICD-10-CM

## 2019-10-01 DIAGNOSIS — I2510 Atherosclerotic heart disease of native coronary artery without angina pectoris: Secondary | ICD-10-CM

## 2019-10-01 DIAGNOSIS — Z23 Encounter for immunization: Secondary | ICD-10-CM

## 2019-10-01 DIAGNOSIS — I10 Essential (primary) hypertension: Secondary | ICD-10-CM | POA: Diagnosis not present

## 2019-10-01 MED ORDER — LOSARTAN POTASSIUM 100 MG PO TABS: 100.0000 mg | ORAL_TABLET | Freq: Every day | ORAL | 3 refills | Status: DC

## 2019-10-01 NOTE — Progress Notes (Signed)
   Covid-19 Vaccination Clinic  Name:  Todd Weaver    MRN: [id number] DOB: 1951-04-19  10/01/2019  Todd Weaver was observed post Covid-19 immunization for 15 minutes without incident. He was provided with Vaccine Information Sheet and instruction to access the V-Safe system.   Todd Weaver was instructed to call 911 with any severe reactions post vaccine: Marland Kitchen Difficulty breathing  . Swelling of face and throat  . A fast heartbeat  . A bad rash all over body  . Dizziness and weakness   Immunizations Administered    Name Date Dose VIS Date Route   Pfizer COVID-19 Vaccine 10/01/2019 11:24 AM 0.3 mL 07/03/2019 Intramuscular   Manufacturer: [place]   Lot: [id number]   [place]: [id number]

## 2019-10-01 NOTE — Progress Notes (Signed)
Office Visit    Patient Name: Todd Weaver Date of Encounter: 10/01/2019  Primary Care Provider:  Glean Hess, MD Primary Cardiologist:  Kathlyn Sacramento, MD  Chief Complaint    69 year old male with a history of CAD status post inferior STEMI with RCA and left main/LAD stenting, peripheral arterial disease with claudication, hypertension, hyperlipidemia, CVA, and GERD, who presents for follow-up related to dyspnea.  Past Medical History    Past Medical History:  Diagnosis Date  . Acute ST elevation myocardial infarction (STEMI) of inferior wall ([place]) 07/18/2017  . CAD (coronary artery disease)    a. 03/2016 MV: EF 55-65%, low risk; b. 06/2017 Inf STEMI: LM 85d, LAD 60p, RCA 99ost (3.25x18 Xience [place] DES/3.0x38 [place] DES), RPAV 60; c. 08/2017 PCI dLM/LAD (3.0x38 Xience [place] DES); d. 10/2017 Cath: LM/prox LAD patent, LAD 107m, 40d, RI 70, LCX 40p, RCA patent stent. EF 50-55; e. 08/2019 MV: Low risk EF 55-65%.  . Contrast media allergy   . GERD (gastroesophageal reflux disease)   . Hemorrhoid   . Hyperlipidemia   . Hypertension   . Ischemic cardiomyopathy    a. 06/2017 Echo: EF 45-50%, mod inf/infsept HK. Mildly dil LA; b. 09/2019 Echo: EF 45-50%, Gr2 DD. Mild MR.  Marland Kitchen PAD (peripheral artery disease) ([place])    a. 01/2019 ABIs and Duplex: ABI R 0.78, L 0.79; TBI R 0.66, L 1.11. RCIA >50p, RSFA 50-74. LCIA >50p, LSFA 75-99p, 30-49d. 3 vessel runoff bilat.  Marland Kitchen PAF (paroxysmal atrial fibrillation) ([place])    a. Brief episode during December hospitalization-->converted on IV amio-->not on [place].  Marland Kitchen Stroke [facility]) 2011   "2 MINI STROKES". no deficits.  Marland Kitchen Unstable angina [facility])    Past Surgical History:  Procedure Laterality Date  . ANAL FISTULECTOMY N/A 12/21/2014   Procedure: FISTULECTOMY ANAL;  Surgeon: Christene Lye, MD;  Location: [facility] ORS;  Service: General;  Laterality: N/A;  . APPENDECTOMY  1975  . COLONOSCOPY WITH PROPOFOL N/A 02/12/2019   Procedure: COLONOSCOPY WITH  PROPOFOL;  Surgeon: Lucilla Lame, MD;  Location: [facility] ENDOSCOPY;  Service: Endoscopy;  Laterality: N/A;  . CORONARY STENT INTERVENTION N/A 09/09/2017   Procedure: CORONARY STENT INTERVENTION;  Surgeon: Wellington Hampshire, MD;  Location: [place] CV LAB;  Service: Cardiovascular;  Laterality: N/A;  . CORONARY/GRAFT ACUTE MI REVASCULARIZATION N/A 07/18/2017   Procedure: Coronary/Graft Acute MI Revascularization;  Surgeon: Wellington Hampshire, MD;  Location: [place] CV LAB;  Service: Cardiovascular;  Laterality: N/A;  . HEMORRHOID SURGERY N/A 12/21/2014   Procedure: HEMORRHOIDECTOMY;  Surgeon: Christene Lye, MD;  Location: [facility] ORS;  Service: General;  Laterality: N/A;  . INTRAVASCULAR ULTRASOUND/IVUS N/A 09/09/2017   Procedure: Intravascular Ultrasound/IVUS;  Surgeon: Wellington Hampshire, MD;  Location: [place] CV LAB;  Service: Cardiovascular;  Laterality: N/A;  . KNEE SURGERY Right age 81  . LEFT HEART CATH AND CORONARY ANGIOGRAPHY N/A 07/18/2017   Procedure: LEFT HEART CATH AND CORONARY ANGIOGRAPHY;  Surgeon: Wellington Hampshire, MD;  Location: [place] CV LAB;  Service: Cardiovascular;  Laterality: N/A;  . LEFT HEART CATH AND CORONARY ANGIOGRAPHY Left 08/19/2017   Procedure: LEFT HEART CATH AND CORONARY ANGIOGRAPHY;  Surgeon: Wellington Hampshire, MD;  Location: [place] CV LAB;  Service: Cardiovascular;  Laterality: Left;  . LEFT HEART CATH AND CORONARY ANGIOGRAPHY N/A 11/07/2017   Procedure: LEFT HEART CATH AND CORONARY ANGIOGRAPHY;  Surgeon: Wellington Hampshire, MD;  Location: [place] CV LAB;  Service: Cardiovascular;  Laterality: N/A;  .  ROTATOR CUFF REPAIR Right 2013    Allergies  Allergies  Allergen Reactions  . Codeine Hives  . Shellfish Allergy Hives and Swelling  . Contrast Media [Iodinated Diagnostic Agents] Itching and Other (See Comments)    Whelts on tongue  . Fish Allergy Itching    History of Present Illness    69 year old male with above past  medical history including coronary artery disease status post inferior STEMI in December 2018 requiring 2 overlapping drug-eluting stents to the RCA.  He was also noted to have distal left main, ostial LAD disease at that time.  Periprocedural course was complicated by atrial fibrillation which resolved with amiodarone.  He underwent staged intervention to the left main/LAD in February 2019 with Xience drug-eluting stent placement.  He had recurrent chest pain in April 2019 underwent diagnostic catheterization revealing stable anatomy with a 70% ramus intermedius stenosis, which has been medically managed.  Other history includes peripheral arterial disease with bilateral nonlifestyle limiting calf claudication (ABIs July 2020-right: 0.78, left 0.79.  Duplex with left greater than right bilateral SFA disease), hypertension, hyperlipidemia, CVA, and GERD.  In late January of this year, he was seen in the emergency department with chest pain.  EKG was unremarkable while high-sensitivity troponins were normal.  He was discharged home and followed up in our office the same day and stress testing was ordered.  This was carried out and was low risk without ischemia.  At follow-up office visit on February 25, he reported dyspnea on exertion and dizziness.  He was switched from metoprolol to carvedilol for improved blood pressure control and losartan was also increased.  Lab work was unremarkable and he was advised to double his Lasix for 3 days as there was a question of mild volume overload on examination.  Echocardiogram was performed and showed stable, mild LV dysfunction with an EF of 45 to 50% and grade 2 diastolic dysfunction.  Only mild mitral regurgitation was noted.  Since his last visit, he has done well.  He noted that after taking Lasix 40 mg daily for 3 days, that he had reduced abdominal girth.  Through the end of February, he was walking about 5 to 6 miles multiple days a week as he is an avid Musician.  He has not been experiencing any significant dyspnea on exertion or lower extremity claudication.  He denies chest pain.  Rapid season ended in February and now he is working with a Chief Strategy Officer helping to build a deck.  If he is pushing a wheelbarrow, he will note dyspnea on exertion but thinks he is just deconditioned.  He has been weighing himself daily at home and he has been trending right around 187 pounds pretty consistently.  He denies palpitations, PND, orthopnea, dizziness, syncope, edema, or early satiety.  Home Medications    Prior to Admission medications   Medication Sig Start Date End Date Taking? Authorizing Provider  aspirin EC 81 MG tablet Take 81 mg by mouth daily.  05/23/09   [provider]  atorvastatin (LIPITOR) 80 MG tablet TAKE 1 TABLET EVERY DAY AT 6:00PM 02/25/19   Glean Hess, MD  carvedilol (COREG) 3.125 MG tablet Take 1 tablet (3.125 mg total) by mouth 2 (two) times daily with a meal. 09/17/19   Marrianne Mood D, PA-C  clopidogrel (PLAVIX) 75 MG tablet Take 1 tablet (75 mg total) by mouth daily. 02/25/19   Glean Hess, MD  ezetimibe (ZETIA) 10 MG tablet TAKE 1 TABLET (10 MG  TOTAL) BY MOUTH DAILY. 09/05/19   Glean Hess, MD  furosemide (LASIX) 20 MG tablet Take 1 tablet (20 mg total) by mouth daily. 02/25/19   Glean Hess, MD  losartan (COZAAR) 50 MG tablet Take 1 tablet (50 mg total) by mouth daily. 09/17/19   Marrianne Mood D, PA-C  nitroGLYCERIN (NITROSTAT) 0.4 MG SL tablet Place 1 tablet (0.4 mg total) under the tongue every 5 (five) minutes as needed for chest pain. 02/25/19   Glean Hess, MD  potassium chloride (K-DUR) 10 MEQ tablet Take 1 tablet (10 mEq total) by mouth daily. 02/25/19   Glean Hess, MD  silver sulfADIAZINE (SILVADENE) 1 % cream Apply 1 application topically daily. To burn on abdomen 07/08/19   Glean Hess, MD  tamsulosin [facility]) 0.4 MG CAPS capsule Take 1 capsule (0.4 mg total) by mouth daily.  02/25/19   Glean Hess, MD    Review of Systems    Notes dyspnea on exertion when toting something heavy like pushing a full wheelbarrow.  Otherwise, he is able to walk 1 to 2 miles without limitations or having to rest.  He denies chest pain, palpitations, PND, orthopnea, dizziness, syncope, edema, or early satiety.  All other systems reviewed and are otherwise negative except as noted above.  Physical Exam    VS:  BP 140/84 (BP Location: Left Arm, Patient Position: Sitting, Cuff Size: Normal)   Pulse (!) 51   Ht 5\' 7"  (1.702 m)   Wt 188 lb 12 oz (85.6 kg)   SpO2 98%   BMI 29.56 kg/m  , BMI Body mass index is 29.56 kg/m. GEN: Well nourished, well developed, in no acute distress. HEENT: normal. Neck: Supple, no JVD, carotid bruits, or masses. Cardiac: RRR, no murmurs, rubs, or gallops. No clubbing, cyanosis, edema.  Radials/PT 2+ and equal bilaterally.  Respiratory:  Respirations regular and unlabored, clear to auscultation bilaterally. GI: Soft, nontender, nondistended, BS + x 4. MS: no deformity or atrophy. Skin: warm and dry, no rash. Neuro:  Strength and sensation are intact. Psych: Normal affect.  Accessory Clinical Findings    ECG personally reviewed by me today -sinus bradycardia, 51, T wave inversion in 3 and aVF - no acute changes.  Lab Results  Component Value Date   WBC 6.0 08/14/2019   HGB 13.8 08/14/2019   HCT 40.3 08/14/2019   MCV 83.3 08/14/2019   PLT 254 08/14/2019   Lab Results  Component Value Date   CREATININE 1.03 09/17/2019   BUN 11 09/17/2019   NA 141 09/17/2019   K 4.6 09/17/2019   CL 105 09/17/2019   CO2 24 09/17/2019   Lab Results  Component Value Date   ALT 34 09/17/2019   AST 31 09/17/2019   ALKPHOS 59 09/17/2019   BILITOT 1.1 09/17/2019   Lab Results  Component Value Date   CHOL 146 09/17/2019   HDL 60 09/17/2019   LDLCALC 77 09/17/2019   LDLDIRECT 116 (H) 09/24/2017   TRIG 36 09/17/2019   CHOLHDL 2.4 09/17/2019    Lab  Results  Component Value Date   HGBA1C 5.8 (H) 11/06/2017    Assessment & Plan    1.  Coronary artery disease: Status post prior inferior MI with RCA stenting and subsequent PCI and drug-eluting stent placement to the distal left main and proximal LAD in early 2019.  He was recently seen in the emergency department in January with chest pain and without objective evidence of ischemia.  He  subsequently underwent stress testing in February which was low risk.  Follow-up echo shows stable LV dysfunction with an EF of 45-50% and grade 2 diastolic dysfunction.  He is a fairly active gentleman, typically walking 5 to 6 miles per day during rapid hunting season, which just ended at the end of February.  He is now working with a Chief Strategy Officer and is exerting himself sometimes quite heavily without chest pain.  He does have dyspnea at higher levels of activity.  He remains on aspirin, statin, beta-blocker, Plavix, Zetia.  2.  Chronic diastolic congestive heart failure: Recent echo with an EF of 45-50% and grade 2 diastolic dysfunction.  His last visit, he was felt mildly volume overloaded was advised to increase his Lasix to 40 mg daily x3 days.  He did note improvement in abdominal girth after doubling up on Lasix for 3 days and his weight has been stable at 187 pounds on his home scale.  He has also noticed some improvement in dyspnea.  He is euvolemic on examination today.  We did discuss his diet today and he notes that though he does not add salt, he does use a lot of hot sauce, which I recommended he cut back on.  He also drinks 3-4 beers 3-4 times a week and I advised to cut back to no more than 1 beer.  I did advise that he may take an additional 20 mg of Lasix for any weight gain of 3 pounds over 24 hours or 5 pounds in 1 week.  Finally, he remains hypertensive with a pressure of 140/84 today.  Is been trending in the high 130s to 150s at home.  I will further his losartan to 100 mg daily and otherwise  continue carvedilol therapy.  Plan to follow-up basic metabolic panel in 1 week.  3.  Essential hypertension: Pressures remain elevated as outlined above.  Increasing losartan 100 mg daily.  Continue current dose of carvedilol (no room to titrate given sinus bradycardia).  4.  Hyperlipidemia: LDL of 77 with normal LFTs on February 25.  Continue statin and Zetia therapy.  5.  Peripheral arterial disease: Known moderate bilateral common iliac and left greater than right SFA disease with three-vessel runoff by duplex last year.  Despite this, he does quite well and is typically able to walk 1 to 2 miles without limitations or symptoms.  Continue medical therapy including aspirin and statin.  He will be due for follow-up ABIs in July.  6.  Alcohol use: Drinking 3-4 beers 3-4 times per week.  Advised to cut back to no more than 1 beer on any given occasion.  7.  Disposition: Follow-up basic metabolic panel in 1 week.  Follow-up in office in approximately 3 months.  Murray Hodgkins, NP 10/01/2019, 9:26 AM

## 2019-10-01 NOTE — Patient Instructions (Signed)
Medication Instructions:  1- INCREASE Losartan Take 1 tablet (100 mg total) by mouth daily. *If you need a refill on your cardiac medications before your next appointment, please call your pharmacy*   Lab Work: Your physician recommends that you return for lab work in: 1 week at the medical mall.  No appt is needed. Hours are M-F 7AM- 6 PM.  If you have labs (blood work) drawn today and your tests are completely normal, you will receive your results only by: Marland Kitchen MyChart Message (if you have MyChart) OR . A paper copy in the mail If you have any lab test that is abnormal or we need to change your treatment, we will call you to review the results.   Testing/Procedures: None ordered    Follow-Up: At [facility], you and your health needs are our priority.  As part of our continuing mission to provide you with exceptional heart care, we have created designated Provider Care Teams.  These Care Teams include your primary Cardiologist (physician) and Advanced Practice Providers (APPs -  Physician Assistants and Nurse Practitioners) who all work together to provide you with the care you need, when you need it.  We recommend signing up for the patient portal called "MyChart".  Sign up information is provided on this After Visit Summary.  MyChart is used to connect with patients for Virtual Visits (Telemedicine).  Patients are able to view lab/test results, encounter notes, upcoming appointments, etc.  Non-urgent messages can be sent to your provider as well.   To learn more about what you can do with MyChart, go to [email or web address].    Your next appointment:   3 month(s)  The format for your next appointment:   In Person  Provider:    You may see Kathlyn Sacramento, MD or Murray Hodgkins, NP.

## 2019-10-02 NOTE — Addendum Note (Signed)
Addended by: Raelene Bott, Winfred Iiams L on: 10/02/2019 04:12 PM   Modules accepted: Orders

## 2019-10-03 ENCOUNTER — Other Ambulatory Visit: Payer: Self-pay | Admitting: Internal Medicine

## 2019-10-03 DIAGNOSIS — T300 Burn of unspecified body region, unspecified degree: Secondary | ICD-10-CM

## 2019-10-07 ENCOUNTER — Other Ambulatory Visit
Admission: RE | Admit: 2019-10-07 | Discharge: 2019-10-07 | Disposition: A | Discharge status: Home / Self Care | Payer: Medicare HMO | Source: Ambulatory Visit | Attending: Nurse Practitioner | Admitting: Nurse Practitioner

## 2019-10-07 DIAGNOSIS — I5032 Chronic diastolic (congestive) heart failure: Secondary | ICD-10-CM | POA: Diagnosis not present

## 2019-10-07 LAB — BASIC METABOLIC PANEL
Anion gap: 10 (ref 5–15)
BUN: 16 mg/dL (ref 8–23)
CO2: 26 mmol/L (ref 22–32)
Calcium: 9.2 mg/dL (ref 8.9–10.3)
Chloride: 107 mmol/L (ref 98–111)
Creatinine, Ser: 1 mg/dL (ref 0.61–1.24)
GFR calc Af Amer: >60 mL/min (ref >60)
GFR calc non Af Amer: >60 mL/min (ref >60)
Glucose, Bld: 105 mg/dL — ABNORMAL HIGH (ref 70–99)
Potassium: 4.1 mmol/L (ref 3.5–5.1)
Sodium: 143 mmol/L (ref 135–145)

## 2019-10-27 ENCOUNTER — Ambulatory Visit: Payer: Medicare HMO | Attending: Internal Medicine

## 2019-10-27 DIAGNOSIS — Z23 Encounter for immunization: Secondary | ICD-10-CM

## 2019-10-27 NOTE — Progress Notes (Signed)
   Covid-19 Vaccination Clinic  Name:  Todd Weaver    MRN: [id number] DOB: 10/08/1950  10/27/2019  Todd Weaver was observed post Covid-19 immunization for 15 minutes without incident. He was provided with Vaccine Information Sheet and instruction to access the V-Safe system.   Todd Weaver was instructed to call 911 with any severe reactions post vaccine: Marland Kitchen Difficulty breathing  . Swelling of face and throat  . A fast heartbeat  . A bad rash all over body  . Dizziness and weakness   Immunizations Administered    Name Date Dose VIS Date Route   Pfizer COVID-19 Vaccine 10/27/2019  8:27 AM 0.3 mL 07/03/2019 Intramuscular   Manufacturer: [place]   Lot: [phone] [id number]   [place]: [id number]

## 2019-12-03 ENCOUNTER — Telehealth: Payer: Self-pay | Admitting: Internal Medicine

## 2019-12-03 NOTE — Telephone Encounter (Signed)
Left message for patient to call back and schedule Medicare Annual Wellness Visit (AWV) either virtually/audio only or in office. Whichever the patients preference is.  Last AWV 12/03/18; please schedule 12/04/19 OR AFTER with [facility]-Nurse Health Advisor.

## 2019-12-03 NOTE — Telephone Encounter (Signed)
Patient is returning Allison's call to schedule AWV. Call back [phone]

## 2019-12-09 ENCOUNTER — Ambulatory Visit (INDEPENDENT_AMBULATORY_CARE_PROVIDER_SITE_OTHER): Payer: Medicare HMO

## 2019-12-09 VITALS — BP 110/62 | HR 56 | Resp 16 | Ht 67.0 in | Wt 186.6 lb

## 2019-12-09 DIAGNOSIS — Z0000 Encounter for general adult medical examination without abnormal findings: Secondary | ICD-10-CM

## 2019-12-09 DIAGNOSIS — Z23 Encounter for immunization: Secondary | ICD-10-CM

## 2019-12-09 NOTE — Patient Instructions (Signed)
[name] , Thank you for taking time to come for your Medicare Wellness Visit. I appreciate your ongoing commitment to your health goals. Please review the following plan we discussed and let me know if I can assist you in the future.   Screening recommendations/referrals: Colonoscopy: done 02/12/19 Recommended yearly ophthalmology/optometry visit for glaucoma screening and checkup Recommended yearly dental visit for hygiene and checkup  Vaccinations: Influenza vaccine: done 03/31/19 Pneumococcal vaccine: done today Tdap vaccine: done 06/19/17 Shingles vaccine: Shingrix discussed. Please contact your pharmacy for coverage information.  Covid-19: done 10/01/19 & 10/27/19  Advanced directives: Advance directive discussed with you today. I have provided a copy for you to complete at home and have notarized. Once this is complete please bring a copy in to our office so we can scan it into your chart.  Conditions/risks identified: Recommend increasing physical activity  Next appointment: Please follow up in one year for your Medicare Annual Wellness visit.    Preventive Care 67 Years and Older, Male Preventive care refers to lifestyle choices and visits with your health care provider that can promote health and wellness. What does preventive care include?  A yearly physical exam. This is also called an annual well check.  Dental exams once or twice a year.  Routine eye exams. Ask your health care provider how often you should have your eyes checked.  Personal lifestyle choices, including:  Daily care of your teeth and gums.  Regular physical activity.  Eating a healthy diet.  Avoiding tobacco and drug use.  Limiting alcohol use.  Practicing safe sex.  Taking low doses of aspirin every day.  Taking vitamin and mineral supplements as recommended by your health care provider. What happens during an annual well check? The services and screenings done by your health care provider  during your annual well check will depend on your age, overall health, lifestyle risk factors, and family history of disease. Counseling  Your health care provider may ask you questions about your:  Alcohol use.  Tobacco use.  Drug use.  Emotional well-being.  Home and relationship well-being.  Sexual activity.  Eating habits.  History of falls.  Memory and ability to understand (cognition).  Work and work Statistician. Screening  You may have the following tests or measurements:  Height, weight, and BMI.  Blood pressure.  Lipid and cholesterol levels. These may be checked every 5 years, or more frequently if you are over 67 years old.  Skin check.  Lung cancer screening. You may have this screening every year starting at age 67 if you have a 30-pack-year history of smoking and currently smoke or have quit within the past 15 years.  Fecal occult blood test (FOBT) of the stool. You may have this test every year starting at age 10.  Flexible sigmoidoscopy or colonoscopy. You may have a sigmoidoscopy every 5 years or a colonoscopy every 10 years starting at age 67.  Prostate cancer screening. Recommendations will vary depending on your family history and other risks.  Hepatitis C blood test.  Hepatitis B blood test.  Sexually transmitted disease (STD) testing.  Diabetes screening. This is done by checking your blood sugar (glucose) after you have not eaten for a while (fasting). You may have this done every 1-3 years.  Abdominal aortic aneurysm (AAA) screening. You may need this if you are a current or former smoker.  Osteoporosis. You may be screened starting at age 67 if you are at high risk. Talk with your health care  provider about your test results, treatment options, and if necessary, the need for more tests. Vaccines  Your health care provider may recommend certain vaccines, such as:  Influenza vaccine. This is recommended every year.  Tetanus,  diphtheria, and acellular pertussis (Tdap, Td) vaccine. You may need a Td booster every 10 years.  Zoster vaccine. You may need this after age 67.  Pneumococcal 13-valent conjugate (PCV13) vaccine. One dose is recommended after age 67.  Pneumococcal polysaccharide (PPSV23) vaccine. One dose is recommended after age 67. Talk to your health care provider about which screenings and vaccines you need and how often you need them. This information is not intended to replace advice given to you by your health care provider. Make sure you discuss any questions you have with your health care provider. Document Released: 08/05/2015 Document Revised: 03/28/2016 Document Reviewed: 05/10/2015 Elsevier Interactive Patient Education  2017 [place] Prevention in the Home Falls can cause injuries. They can happen to people of all ages. There are many things you can do to make your home safe and to help prevent falls. What can I do on the outside of my home?  Regularly fix the edges of walkways and driveways and fix any cracks.  Remove anything that might make you trip as you walk through a door, such as a raised step or threshold.  Trim any bushes or trees on the path to your home.  Use bright outdoor lighting.  Clear any walking paths of anything that might make someone trip, such as rocks or tools.  Regularly check to see if handrails are loose or broken. Make sure that both sides of any steps have handrails.  Any raised decks and porches should have guardrails on the edges.  Have any leaves, snow, or ice cleared regularly.  Use sand or salt on walking paths during winter.  Clean up any spills in your garage right away. This includes oil or grease spills. What can I do in the bathroom?  Use night lights.  Install grab bars by the toilet and in the tub and shower. Do not use towel bars as grab bars.  Use non-skid mats or decals in the tub or shower.  If you need to sit down in  the shower, use a plastic, non-slip stool.  Keep the floor dry. Clean up any water that spills on the floor as soon as it happens.  Remove soap buildup in the tub or shower regularly.  Attach bath mats securely with double-sided non-slip rug tape.  Do not have throw rugs and other things on the floor that can make you trip. What can I do in the bedroom?  Use night lights.  Make sure that you have a light by your bed that is easy to reach.  Do not use any sheets or blankets that are too big for your bed. They should not hang down onto the floor.  Have a firm chair that has side arms. You can use this for support while you get dressed.  Do not have throw rugs and other things on the floor that can make you trip. What can I do in the kitchen?  Clean up any spills right away.  Avoid walking on wet floors.  Keep items that you use a lot in easy-to-reach places.  If you need to reach something above you, use a strong step stool that has a grab bar.  Keep electrical cords out of the way.  Do not use floor polish  or wax that makes floors slippery. If you must use wax, use non-skid floor wax.  Do not have throw rugs and other things on the floor that can make you trip. What can I do with my stairs?  Do not leave any items on the stairs.  Make sure that there are handrails on both sides of the stairs and use them. Fix handrails that are broken or loose. Make sure that handrails are as long as the stairways.  Check any carpeting to make sure that it is firmly attached to the stairs. Fix any carpet that is loose or worn.  Avoid having throw rugs at the top or bottom of the stairs. If you do have throw rugs, attach them to the floor with carpet tape.  Make sure that you have a light switch at the top of the stairs and the bottom of the stairs. If you do not have them, ask someone to add them for you. What else can I do to help prevent falls?  Wear shoes that:  Do not have high  heels.  Have rubber bottoms.  Are comfortable and fit you well.  Are closed at the toe. Do not wear sandals.  If you use a stepladder:  Make sure that it is fully opened. Do not climb a closed stepladder.  Make sure that both sides of the stepladder are locked into place.  Ask someone to hold it for you, if possible.  Clearly mark and make sure that you can see:  Any grab bars or handrails.  First and last steps.  Where the edge of each step is.  Use tools that help you move around (mobility aids) if they are needed. These include:  Canes.  Walkers.  Scooters.  Crutches.  Turn on the lights when you go into a dark area. Replace any light bulbs as soon as they burn out.  Set up your furniture so you have a clear path. Avoid moving your furniture around.  If any of your floors are uneven, fix them.  If there are any pets around you, be aware of where they are.  Review your medicines with your doctor. Some medicines can make you feel dizzy. This can increase your chance of falling. Ask your doctor what other things that you can do to help prevent falls. This information is not intended to replace advice given to you by your health care provider. Make sure you discuss any questions you have with your health care provider. Document Released: 05/05/2009 Document Revised: 12/15/2015 Document Reviewed: 08/13/2014 Elsevier Interactive Patient Education  2017 [employer].

## 2019-12-09 NOTE — Progress Notes (Signed)
Subjective:   Todd Weaver is a 69 y.o. male who presents for Medicare Annual/Subsequent preventive examination.  Review of Systems:    Cardiac Risk Factors include: advanced age (>32men, >57 women);hypertension;dyslipidemia;male gender     Objective:    Vitals: BP 110/62 (BP Location: Right Arm, Patient Position: Sitting, Cuff Size: Normal)   Pulse (!) 56   Resp 16   Ht 5\' 7"  (1.702 m)   Wt 186 lb 9.6 oz (84.6 kg)   SpO2 97%   BMI 29.23 kg/m   Body mass index is 29.23 kg/m.  Advanced Directives 12/09/2019 08/14/2019 02/12/2019 12/03/2018 11/06/2017 11/06/2017 11/06/2017  Does Patient Have a Medical Advance Directive? No No No No No No No  Would patient like information on creating a medical advance directive? Yes (MAU/Ambulatory/Procedural Areas - Information given) - No - Patient declined Yes (MAU/Ambulatory/Procedural Areas - Information given) No - Patient declined Yes (Inpatient - patient requests chaplain consult to create a medical advance directive) -    Tobacco Social History   Tobacco Use  Smoking Status Former Smoker  . Types: Cigarettes  . Quit date: 07/24/2007  . Years since quitting: 12.3  Smokeless Tobacco Never Used  Tobacco Comment   smoking cessation materials not required     Counseling given: Not Answered Comment: smoking cessation materials not required   Clinical Intake:  Pre-visit preparation completed: Yes  Pain : 0-10 Pain Score: 8  Pain Type: Acute pain Pain Location: Elbow Pain Orientation: Left Pain Descriptors / Indicators: Aching, Discomfort, Sore Pain Onset: 1 to 4 weeks ago Pain Frequency: Constant     BMI - recorded: 29.23 Nutritional Status: BMI 25 -29 Overweight Nutritional Risks: None Diabetes: No  How often do you need to have someone help you when you read instructions, pamphlets, or other written materials from your doctor or pharmacy?: 1 - Never  Interpreter Needed?: No  Information entered by :: Clemetine Marker  LPN  Past Medical History:  Diagnosis Date  . Acute ST elevation myocardial infarction (STEMI) of inferior wall ([place]) 07/18/2017  . CAD (coronary artery disease)    a. 03/2016 MV: EF 55-65%, low risk; b. 06/2017 Inf STEMI: LM 85d, LAD 60p, RCA 99ost (3.25x18 Xience [place] DES/3.0x38 [place] DES), RPAV 60; c. 08/2017 PCI dLM/LAD (3.0x38 Xience [place] DES); d. 10/2017 Cath: LM/prox LAD patent, LAD 20m, 40d, RI 70, LCX 40p, RCA patent stent. EF 50-55; e. 08/2019 MV: Low risk EF 55-65%.  . Contrast media allergy   . GERD (gastroesophageal reflux disease)   . Hemorrhoid   . Hyperlipidemia   . Hypertension   . Ischemic cardiomyopathy    a. 06/2017 Echo: EF 45-50%, mod inf/infsept HK. Mildly dil LA; b. 09/2019 Echo: EF 45-50%, Gr2 DD. Mild MR.  Marland Kitchen PAD (peripheral artery disease) ([place])    a. 01/2019 ABIs and Duplex: ABI R 0.78, L 0.79; TBI R 0.66, L 1.11. RCIA >50p, RSFA 50-74. LCIA >50p, LSFA 75-99p, 30-49d. 3 vessel runoff bilat.  Marland Kitchen PAF (paroxysmal atrial fibrillation) ([place])    a. Brief episode during December hospitalization-->converted on IV amio-->not on [place].  Marland Kitchen Stroke [facility]) 2011   "2 MINI STROKES". no deficits.  Marland Kitchen Unstable angina [facility])    Past Surgical History:  Procedure Laterality Date  . ANAL FISTULECTOMY N/A 12/21/2014   Procedure: FISTULECTOMY ANAL;  Surgeon: Christene Lye, MD;  Location: [facility] ORS;  Service: General;  Laterality: N/A;  . APPENDECTOMY  1975  . COLONOSCOPY WITH PROPOFOL N/A 02/12/2019   Procedure:  COLONOSCOPY WITH PROPOFOL;  Surgeon: Lucilla Lame, MD;  Location: [facility] ENDOSCOPY;  Service: Endoscopy;  Laterality: N/A;  . CORONARY STENT INTERVENTION N/A 09/09/2017   Procedure: CORONARY STENT INTERVENTION;  Surgeon: Wellington Hampshire, MD;  Location: [place] CV LAB;  Service: Cardiovascular;  Laterality: N/A;  . CORONARY/GRAFT ACUTE MI REVASCULARIZATION N/A 07/18/2017   Procedure: Coronary/Graft Acute MI Revascularization;  Surgeon: Wellington Hampshire, MD;  Location:  [place] CV LAB;  Service: Cardiovascular;  Laterality: N/A;  . HEMORRHOID SURGERY N/A 12/21/2014   Procedure: HEMORRHOIDECTOMY;  Surgeon: Christene Lye, MD;  Location: [facility] ORS;  Service: General;  Laterality: N/A;  . INTRAVASCULAR ULTRASOUND/IVUS N/A 09/09/2017   Procedure: Intravascular Ultrasound/IVUS;  Surgeon: Wellington Hampshire, MD;  Location: [place] CV LAB;  Service: Cardiovascular;  Laterality: N/A;  . KNEE SURGERY Right age 77  . LEFT HEART CATH AND CORONARY ANGIOGRAPHY N/A 07/18/2017   Procedure: LEFT HEART CATH AND CORONARY ANGIOGRAPHY;  Surgeon: Wellington Hampshire, MD;  Location: [place] [facility] LAB;  Service: Cardiovascular;  Laterality: N/A;  . LEFT HEART CATH AND CORONARY ANGIOGRAPHY Left 08/19/2017   Procedure: LEFT HEART CATH AND CORONARY ANGIOGRAPHY;  Surgeon: Wellington Hampshire, MD;  Location: [place] CV LAB;  Service: Cardiovascular;  Laterality: Left;  . LEFT HEART CATH AND CORONARY ANGIOGRAPHY N/A 11/07/2017   Procedure: LEFT HEART CATH AND CORONARY ANGIOGRAPHY;  Surgeon: Wellington Hampshire, MD;  Location: [place] [facility] LAB;  Service: Cardiovascular;  Laterality: N/A;  . ROTATOR CUFF REPAIR Right 2013   Family History  Problem Relation Age of Onset  . Hypertension Mother   . Alzheimer's disease Mother   . Hypertension Father   . Alzheimer's disease Father    Social History   Socioeconomic History  . Marital status: Married    Spouse name: Not on file  . Number of children: 2  . Years of education: Not on file  . Highest education level: 11th grade  Occupational History  . Occupation: Retired  Tobacco Use  . Smoking status: Former Smoker    Types: Cigarettes    Quit date: 07/24/2007    Years since quitting: 12.3  . Smokeless tobacco: Never Used  . Tobacco comment: smoking cessation materials not required  Substance and Sexual Activity  . Alcohol use: Yes    Alcohol/week: 5.0 - 6.0 standard drinks    Types: 5 - 6 Cans of beer per week     Comment:  weekly  . Drug use: No  . Sexual activity: Not Currently  Other Topics Concern  . Not on file  Social History Narrative   Lives in [place]. Retired.  Completed cardiac rehab program.   Social Determinants of Health   Financial Resource Strain: Low Risk   . Difficulty of Paying Living Expenses: Not hard at all  Food Insecurity: No Food Insecurity  . Worried About Charity fundraiser in the Last Year: Never true  . Ran Out of Food in the Last Year: Never true  Transportation Needs: No Transportation Needs  . Lack of Transportation (Medical): No  . Lack of Transportation (Non-Medical): No  Physical Activity: Insufficiently Active  . Days of Exercise per Week: 2 days  . Minutes of Exercise per Session: 20 min  Stress: No Stress Concern Present  . Feeling of Stress : Only a little  Social Connections: Slightly Isolated  . Frequency of Communication with Friends and Family: More than three times a week  . Frequency of Social Gatherings  with Friends and Family: Three times a week  . Attends Religious Services: More than 4 times per year  . Active Member of Clubs or Organizations: No  . Attends Archivist Meetings: Never  . Marital Status: Married    Outpatient Encounter Medications as of 12/09/2019  Medication Sig  . aspirin EC 81 MG tablet Take 81 mg by mouth daily.   Marland Kitchen atorvastatin (LIPITOR) 80 MG tablet TAKE 1 TABLET EVERY DAY AT 6:00PM  . carvedilol (COREG) 3.125 MG tablet Take 1 tablet (3.125 mg total) by mouth 2 (two) times daily with a meal.  . clopidogrel (PLAVIX) 75 MG tablet Take 1 tablet (75 mg total) by mouth daily.  Marland Kitchen ezetimibe (ZETIA) 10 MG tablet TAKE 1 TABLET (10 MG TOTAL) BY MOUTH DAILY.  . furosemide (LASIX) 20 MG tablet Take 1 tablet (20 mg total) by mouth daily.  Marland Kitchen losartan (COZAAR) 100 MG tablet Take 1 tablet (100 mg total) by mouth daily.  . nitroGLYCERIN (NITROSTAT) 0.4 MG SL tablet Place 1 tablet (0.4 mg total) under the tongue every 5  (five) minutes as needed for chest pain.  . potassium chloride (K-DUR) 10 MEQ tablet Take 1 tablet (10 mEq total) by mouth daily.  . tamsulosin (FLOMAX) 0.4 MG CAPS capsule Take 1 capsule (0.4 mg total) by mouth daily.  . [DISCONTINUED] silver sulfADIAZINE (SILVADENE) 1 % cream Apply 1 application topically daily. To burn on abdomen   No facility-administered encounter medications on file as of 12/09/2019.    Activities of Daily Living In your present state of health, do you have any difficulty performing the following activities: 12/09/2019  Hearing? N  Comment declines hearing aids  Vision? N  Difficulty concentrating or making decisions? N  Walking or climbing stairs? N  Dressing or bathing? N  Doing errands, shopping? N  Preparing Food and eating ? N  Using the Toilet? N  In the past six months, have you accidently leaked urine? N  Do you have problems with loss of bowel control? N  Managing your Medications? N  Managing your Finances? N  Housekeeping or managing your Housekeeping? N  Some recent data might be hidden    Patient Care Team: Glean Hess, MD as PCP - General (Internal Medicine) Wellington Hampshire, MD as PCP - Cardiology (Cardiology) Christene Lye, MD (General Surgery) Sherre Lain, MD as Referring Physician (Dermatology)   Assessment:   This is a routine wellness examination for [place].  Exercise Activities and Dietary recommendations Current Exercise Habits: Home exercise routine, Type of exercise: walking, Time (Minutes): 20, Frequency (Times/Week): 2, Weekly Exercise (Minutes/Week): 40, Intensity: Mild, Exercise limited by: cardiac condition(s)  Goals    . Increase physical activity     Pt would like to increase physical activity to 150 minutes a week       Fall Risk Fall Risk  12/09/2019 03/31/2019 12/03/2018 10/03/2018 03/28/2018  Falls in the past year? 0 0 0 0 No  Number falls in past yr: 0 0 0 0 -  Injury with Fall? 0 0 0 0 -  Risk for  fall due to : No Fall Risks - - - -  Follow up Falls prevention discussed Falls evaluation completed Falls prevention discussed Falls evaluation completed -   FALL RISK PREVENTION PERTAINING TO THE HOME:  Any stairs in or around the home? Yes  If so, do they handrails? Yes   Home free of loose throw rugs in walkways, pet beds, electrical cords,  etc? Yes  Adequate lighting in your home to reduce risk of falls? Yes   ASSISTIVE DEVICES UTILIZED TO PREVENT FALLS:  Life alert? No  Use of a cane, walker or w/c? No  Grab bars in the bathroom? No  Shower chair or bench in shower? No  Elevated toilet seat or a handicapped toilet? No   DME ORDERS:  DME order needed?  No   TIMED UP AND GO:  Was the test performed? Yes .  Length of time to ambulate 10 feet: 5 sec.   GAIT:  Appearance of gait: Gait stead-fast and without the use of an assistive device.    Education: Fall risk prevention has been discussed.  Intervention(s) required? No    Depression Screen PHQ 2/9 Scores 12/09/2019 07/08/2019 03/31/2019 12/03/2018  PHQ - 2 Score 0 0 0 0  PHQ- 9 Score - - - -    Cognitive Function     6CIT Screen 12/09/2019 12/03/2018  What Year? 0 points 0 points  What month? 0 points 0 points  What time? 0 points 0 points  Count back from 20 0 points 0 points  Months in reverse 0 points 0 points  Repeat phrase 2 points 4 points  Total Score 2 4    Immunization History  Administered Date(s) Administered  . Fluad Quad(high Dose 65+) 03/31/2019  . Influenza,inj,Quad PF,6+ Mos 06/19/2017, 03/28/2018  . PFIZER SARS-COV-2 Vaccination 10/01/2019, 10/27/2019  . Pneumococcal Conjugate-13 03/28/2018  . Pneumococcal Polysaccharide-23 12/09/2019  . Tdap 06/19/2017    Qualifies for Shingles Vaccine? Yes . Due for Shingrix. Education has been provided regarding the importance of this vaccine. Pt has been advised to call insurance company to determine out of pocket expense. Advised may also receive  vaccine at local pharmacy or Health Dept. Verbalized acceptance and understanding.  Tdap: Up to date  Flu Vaccine: Up to date  Pneumococcal Vaccine: Due for Pneumococcal vaccine. Does the patient want to receive this vaccine today?  No . Education has been provided regarding the importance of this vaccine but still declined. Advised may receive this vaccine at local pharmacy or Health Dept. Aware to provide a copy of the vaccination record if obtained from local pharmacy or Health Dept. Verbalized acceptance and understanding.  Covid-19 Vaccine: Up to date   Screening Tests Health Maintenance  Topic Date Due  . INFLUENZA VACCINE  02/21/2020  . TETANUS/TDAP  06/20/2027  . COLONOSCOPY  02/11/2029  . COVID-19 Vaccine  Completed  . Hepatitis C Screening  Completed  . PNA vac Low Risk Adult  Completed   Cancer Screenings:  Colorectal Screening: Completed 02/12/19. Repeat every 10 years;   Lung Cancer Screening: (Low Dose CT Chest recommended if Age 59-80 years, 30 pack-year currently smoking OR have quit w/in 15years.) does not qualify.   Additional Screening:  Hepatitis C Screening: does qualify; Completed 03/28/18  Vision Screening: Recommended annual ophthalmology exams for early detection of glaucoma and other disorders of the eye. Is the patient up to date with their annual eye exam?  Yes  Who is the provider or what is the name of the office in which the pt attends annual eye exams? [place] Screening: Recommended annual dental exams for proper oral hygiene  Community Resource Referral:  CRR required this visit?  No       Plan:    I have personally reviewed and addressed the Medicare Annual Wellness questionnaire and have noted the following in the patient's chart:  A.  Medical and social history B. Use of alcohol, tobacco or illicit drugs  C. Current medications and supplements D. Functional ability and status E.  Nutritional status F.  Physical  activity G. Advance directives H. List of other physicians I.  Hospitalizations, surgeries, and ER visits in previous 12 months J.  [place] such as hearing and vision if needed, cognitive and depression L. Referrals and appointments   In addition, I have reviewed and discussed with patient certain preventive protocols, quality metrics, and best practice recommendations. A written personalized care plan for preventive services as well as general preventive health recommendations were provided to patient.   Signed,  Clemetine Marker, LPN Nurse Health Advisor   Nurse Notes: pt c/o difficulty falling asleep and staying asleep at night but sleeping 2-3 hours during the day but not everyday. States he has a good bedtime routine, 1 cup caffeine daily but gets up frequently at night to urinate.   Pt also c/o left elbow pain for the past 3-4 weeks. A refrigerator fell on left arm causing a bruise but he states elbow pain started prior to that. Taking aleve and/or tylenol for pain. Advised pt to schedule appt with Dr. Army Melia if sxs worsen or persist.

## 2019-12-11 ENCOUNTER — Ambulatory Visit (INDEPENDENT_AMBULATORY_CARE_PROVIDER_SITE_OTHER): Payer: Medicare HMO | Admitting: Internal Medicine

## 2019-12-11 ENCOUNTER — Encounter: Payer: Self-pay | Admitting: Internal Medicine

## 2019-12-11 ENCOUNTER — Other Ambulatory Visit: Payer: Self-pay

## 2019-12-11 VITALS — BP 124/70 | HR 63 | Temp 98.3°F | Ht 67.0 in | Wt 186.0 lb

## 2019-12-11 DIAGNOSIS — M7712 Lateral epicondylitis, left elbow: Secondary | ICD-10-CM

## 2019-12-11 DIAGNOSIS — F5101 Primary insomnia: Secondary | ICD-10-CM | POA: Diagnosis not present

## 2019-12-11 NOTE — Progress Notes (Signed)
Date:  12/11/2019   Name:  Todd Weaver   DOB:  20-Jun-1951   MRN:  [id number]   Chief Complaint: Elbow Pain (Left elbow. Been hurting over a month. Then a refridgerator fell on arm and its gotten much worse with pain. )  Arm Pain  The incident occurred more than 1 week ago. The incident occurred at home. The injury mechanism was a direct blow (three weeks ago a refrigerator fell on it). The pain is present in the left elbow. The quality of the pain is described as shooting. The pain does not radiate. The pain is mild. The pain has been fluctuating since the incident. Pertinent negatives include no chest pain, muscle weakness, numbness or tingling. The symptoms are aggravated by movement and palpation. He has tried heat for the symptoms. The treatment provided no relief.  Insomnia Primary symptoms: sleep disturbance, frequent awakening.  The onset quality is undetermined. Treatments tried: over the counter sleep aid - ? benadryl. The treatment provided no relief (it made his feel anxious).  He is never tried any prescription medication for sleep.  Lab Results  Component Value Date   CREATININE 1.00 10/07/2019   BUN 16 10/07/2019   NA 143 10/07/2019   K 4.1 10/07/2019   CL 107 10/07/2019   CO2 26 10/07/2019   Lab Results  Component Value Date   CHOL 146 09/17/2019   HDL 60 09/17/2019   LDLCALC 77 09/17/2019   LDLDIRECT 116 (H) 09/24/2017   TRIG 36 09/17/2019   CHOLHDL 2.4 09/17/2019   Lab Results  Component Value Date   TSH 1.232 07/19/2017   Lab Results  Component Value Date   HGBA1C 5.8 (H) 11/06/2017   Lab Results  Component Value Date   WBC 6.0 08/14/2019   HGB 13.8 08/14/2019   HCT 40.3 08/14/2019   MCV 83.3 08/14/2019   PLT 254 08/14/2019   Lab Results  Component Value Date   ALT 34 09/17/2019   AST 31 09/17/2019   ALKPHOS 59 09/17/2019   BILITOT 1.1 09/17/2019     Review of Systems  Constitutional: Negative for chills, fatigue and fever.    Respiratory: Negative for chest tightness and shortness of breath.   Cardiovascular: Negative for chest pain.  Musculoskeletal: Positive for arthralgias. Negative for joint swelling and myalgias.  Neurological: Negative for dizziness, tingling, numbness and headaches.  Psychiatric/Behavioral: Positive for sleep disturbance. Negative for dysphoric mood. The patient has insomnia. The patient is not nervous/anxious.     Patient Active Problem List   Diagnosis Date Noted  . PAD (peripheral artery disease) ([place]) 03/31/2019  . Encounter for screening colonoscopy   . Rectal polyp   . Pain in joint of left shoulder 10/14/2018  . Impingement syndrome of left shoulder region 10/14/2018  . Coronary artery disease of native artery of native heart with stable angina pectoris ([place]) 10/03/2018  . Chronic left shoulder pain 10/03/2018  . Daytime sleepiness 09/23/2017  . History of ST elevation myocardial infarction (STEMI) 07/18/2017  . Hypertension 06/19/2017  . Hyperlipidemia 06/19/2017  . History of CVA (cerebrovascular accident) 06/19/2017  . Overweight (BMI 25.0-29.9) 06/19/2017  . BPH (benign prostatic hyperplasia) 06/19/2017  . ED (erectile dysfunction) 06/19/2017  . MCI (mild cognitive impairment) 06/19/2017  . FH: Alzheimer's disease 06/19/2017  . Lumbar spondylosis with myelopathy 11/08/2016  . Hand dermatitis 07/26/2014    Allergies  Allergen Reactions  . Codeine Hives  . Shellfish Allergy Hives and Swelling  . Contrast Media [Iodinated  Diagnostic Agents] Itching and Other (See Comments)    Whelts on tongue  . Fish Allergy Itching    Past Surgical History:  Procedure Laterality Date  . ANAL FISTULECTOMY N/A 12/21/2014   Procedure: FISTULECTOMY ANAL;  Surgeon: Christene Lye, MD;  Location: [facility] ORS;  Service: General;  Laterality: N/A;  . APPENDECTOMY  1975  . COLONOSCOPY WITH PROPOFOL N/A 02/12/2019   Procedure: COLONOSCOPY WITH PROPOFOL;  Surgeon: Lucilla Lame, MD;   Location: [facility] END[facility]COPY;  Service: Endoscopy;  Laterality: N/A;  . CORONARY STENT INTERVENTION N/A 09/09/2017   Procedure: CORONARY STENT INTERVENTION;  Surgeon: Wellington Hampshire, MD;  Location: [place] CV LAB;  Service: Cardiovascular;  Laterality: N/A;  . CORONARY/GRAFT ACUTE MI REVASCULARIZATION N/A 07/18/2017   Procedure: Coronary/Graft Acute MI Revascularization;  Surgeon: Wellington Hampshire, MD;  Location: [place] [facility];  Service: Cardiovascular;  Laterality: N/A;  . HEMORRHOID SURGERY N/A 12/21/2014   Procedure: HEMORRHOIDECTOMY;  Surgeon: Christene Lye, MD;  Location: [facility] ORS;  Service: General;  Laterality: N/A;  . INTRAVASCULAR ULTRASOUND/IVUS N/A 09/09/2017   Procedure: Intravascular Ultrasound/IVUS;  Surgeon: Wellington Hampshire, MD;  Location: [place] [facility];  Service: Cardiovascular;  Laterality: N/A;  . KNEE SURGERY Right age 60  . LEFT HEART CATH AND CORONARY ANGIOGRAPHY N/A 07/18/2017   Procedure: LEFT HEART CATH AND CORONARY ANGIOGRAPHY;  Surgeon: Wellington Hampshire, MD;  Location: [place] [facility];  Service: Cardiovascular;  Laterality: N/A;  . LEFT HEART CATH AND CORONARY ANGIOGRAPHY Left 08/19/2017   Procedure: LEFT HEART CATH AND CORONARY ANGIOGRAPHY;  Surgeon: Wellington Hampshire, MD;  Location: [place] [facility];  Service: Cardiovascular;  Laterality: Left;  . LEFT HEART CATH AND CORONARY ANGIOGRAPHY N/A 11/07/2017   Procedure: LEFT HEART CATH AND CORONARY ANGIOGRAPHY;  Surgeon: Wellington Hampshire, MD;  Location: [place] [facility];  Service: Cardiovascular;  Laterality: N/A;  . ROTATOR CUFF REPAIR Right 2013    Social History   Tobacco Use  . Smoking status: Former Smoker    Types: Cigarettes    Quit date: 07/24/2007    Years since quitting: 12.3  . Smokeless tobacco: Never Used  . Tobacco comment: smoking cessation materials not required  Substance Use Topics  . Alcohol use: Yes    Alcohol/week: 5.0 - 6.0 standard drinks    Types: 5 -  6 Cans of beer per week    Comment:  weekly  . Drug use: No     Medication list has been reviewed and updated.  Current Meds  Medication Sig  . aspirin EC 81 MG tablet Take 81 mg by mouth daily.   Marland Kitchen atorvastatin (LIPITOR) 80 MG tablet TAKE 1 TABLET EVERY DAY AT 6:00PM  . carvedilol (COREG) 3.125 MG tablet Take 1 tablet (3.125 mg total) by mouth 2 (two) times daily with a meal.  . clopidogrel (PLAVIX) 75 MG tablet Take 1 tablet (75 mg total) by mouth daily.  Marland Kitchen ezetimibe (ZETIA) 10 MG tablet TAKE 1 TABLET (10 MG TOTAL) BY MOUTH DAILY.  . furosemide (LASIX) 20 MG tablet Take 1 tablet (20 mg total) by mouth daily.  Marland Kitchen losartan (COZAAR) 100 MG tablet Take 1 tablet (100 mg total) by mouth daily.  . nitroGLYCERIN (NITROSTAT) 0.4 MG SL tablet Place 1 tablet (0.4 mg total) under the tongue every 5 (five) minutes as needed for chest pain.  . potassium chloride (K-DUR) 10 MEQ tablet Take 1 tablet (10 mEq total) by mouth daily.  . tamsulosin (  FLOMAX) 0.4 MG CAPS capsule Take 1 capsule (0.4 mg total) by mouth daily.    PHQ 2/9 Scores 12/11/2019 12/09/2019 07/08/2019 03/31/2019  PHQ - 2 Score 0 0 0 0  PHQ- 9 Score 0 - - -    BP Readings from Last 3 Encounters:  12/11/19 124/70  12/09/19 110/62  10/01/19 140/84    Physical Exam Vitals and nursing note reviewed.  Constitutional:      General: He is not in acute distress.    Appearance: Normal appearance. He is well-developed.  HENT:     Head: Normocephalic and atraumatic.  Cardiovascular:     Rate and Rhythm: Normal rate and regular rhythm.  Pulmonary:     Effort: Pulmonary effort is normal. No respiratory distress.     Breath sounds: No wheezing or rhonchi.  Musculoskeletal:        General: Normal range of motion.     Right elbow: Normal.     Left elbow: No swelling or effusion. Tenderness present.     Comments: Tender over the left epicondyl   Skin:    General: Skin is warm and dry.     Findings: No rash.  Neurological:     Mental  Status: He is alert and oriented to person, place, and time.  Psychiatric:        Behavior: Behavior normal.        Thought Content: Thought content normal.     Wt Readings from Last 3 Encounters:  12/11/19 186 lb (84.4 kg)  12/09/19 186 lb 9.6 oz (84.6 kg)  10/01/19 188 lb 12 oz (85.6 kg)    BP 124/70   Pulse 63   Temp 98.3 F (36.8 C) (Oral)   Ht 5\' 7"  (1.702 m)   Wt 186 lb (84.4 kg)   SpO2 97%   BMI 29.13 kg/m   Assessment and Plan: 1. Lateral epicondylitis of left elbow Recommend ice, topical Voltaren gel 4 times per day Wear tennis elbow strap when doing heavy work Follow up if needed  2. Primary insomnia Recommend trying Melatonin 5-10 mg at bedtime   Partially dictated using Editor, commissioning. Any errors are unintentional.  Halina Maidens, MD [facility]  12/11/2019

## 2019-12-11 NOTE — Patient Instructions (Signed)
Melatonin 5-10 mg at bedtime to help with sleep  For the elbow - use ice several times per day  Rub Voltaren gel 1% into the area 4 times per day  Wear a Tennis elbow strap when doing heavy work, heavy lifting, carrying.  Tennis Elbow Tennis elbow is swelling (inflammation) in your outer forearm, near your elbow. Swelling affects the tissues that connect muscle to bone (tendons). Tennis elbow can happen in any sport or job in which you use your elbow too much. It is caused by doing the same motion over and over. Tennis elbow can cause:  Pain and tenderness in your forearm and the outer part of your elbow. You may have pain all the time, or only when using the arm.  A burning feeling. This runs from your elbow through your arm.  Weak grip in your hand. Follow these instructions at home: Activity  Rest your elbow and wrist. Avoid activities that cause problems, as told by your doctor.  If told by your doctor, wear an elbow strap to reduce stress on the area.  Do physical therapy exercises as told.  If you lift an object, lift it with your palm facing up. This is easier on your elbow. Lifestyle  If your tennis elbow is caused by sports, check your equipment and make sure that: ? You are using it correctly. ? It fits you well.  If your tennis elbow is caused by work or by using a computer, take breaks often to stretch your arm. Talk with your manager about how you can manage your condition at work. If you have a brace:  Wear the brace as told by your doctor. Remove it only as told by your doctor.  Loosen the brace if your fingers tingle, get numb, or turn cold and blue.  Keep the brace clean.  If the brace is not waterproof, ask your doctor if you may take the brace off for bathing. If you must keep the brace on while bathing: ? Do not let it get wet. ? Cover it with a watertight covering when you take a bath or a shower. General instructions   If told, put ice on the  painful area: ? Put ice in a plastic bag. ? Place a towel between your skin and the bag. ? Leave the ice on for 20 minutes, 2-3 times a day.  Take over-the-counter and prescription medicines only as told by your doctor.  Keep all follow-up visits as told by your doctor. This is important. Contact a doctor if:  Your pain does not get better with treatment.  Your pain gets worse.  You have weakness in your forearm, hand, or fingers.  You cannot feel your forearm, hand, or fingers. Summary  Tennis elbow is swelling (inflammation) in your outer forearm, near your elbow.  Tennis elbow is caused by doing the same motion over and over.  Rest your elbow and wrist. Avoid activities that cause problems, as told by your doctor.  If told, put ice on the painful area for 20 minutes, 2-3 times a day. This information is not intended to replace advice given to you by your health care provider. Make sure you discuss any questions you have with your health care provider. Document Revised: 04/04/2018 Document Reviewed: 04/23/2017 Elsevier Patient Education  [place].

## 2019-12-13 ENCOUNTER — Other Ambulatory Visit: Payer: Self-pay

## 2019-12-13 ENCOUNTER — Encounter: Payer: Self-pay | Admitting: Emergency Medicine

## 2019-12-13 ENCOUNTER — Ambulatory Visit
Admission: EM | Admit: 2019-12-13 | Discharge: 2019-12-13 | Disposition: A | Discharge status: Home / Self Care | Payer: Medicare HMO | Attending: Urgent Care | Admitting: Urgent Care

## 2019-12-13 DIAGNOSIS — K047 Periapical abscess without sinus: Secondary | ICD-10-CM | POA: Diagnosis not present

## 2019-12-13 DIAGNOSIS — K0889 Other specified disorders of teeth and supporting structures: Secondary | ICD-10-CM

## 2019-12-13 DIAGNOSIS — R030 Elevated blood-pressure reading, without diagnosis of hypertension: Secondary | ICD-10-CM

## 2019-12-13 MED ORDER — PENICILLIN V POTASSIUM 500 MG PO TABS: 500.0000 mg | ORAL_TABLET | Freq: Four times a day (QID) | ORAL | 0 refills | Status: AC

## 2019-12-13 MED ORDER — HYDROCODONE-ACETAMINOPHEN 5-325 MG PO TABS: 1.0000 | ORAL_TABLET | Freq: Three times a day (TID) | ORAL | 0 refills | Status: DC | PRN

## 2019-12-13 NOTE — ED Triage Notes (Signed)
Patient c/o left lower tooth pain that started yesterday.  Patient denies fevers.

## 2019-12-13 NOTE — ED Provider Notes (Signed)
[place], [place]   Name: Todd Weaver DOB: 1950/10/05 MRN: [id number] CSN: [id number] PCP: Glean Hess, MD  Arrival date and time:  12/13/19 0903  Chief Complaint:  Dental Pain  NOTE: Prior to seeing the patient today, I have reviewed the triage nursing documentation and vital signs. Clinical staff has updated patient's PMH/PSHx, current medication list, and drug allergies/intolerances to ensure comprehensive history available to assist in medical decision making.   History:   HPI: Todd Weaver is a 69 y.o. male who presents today with complaints of acute onset LEFT lower jaw pain and facial swelling that started yesterday. Patient has poor dentition; notes a known loose tooth. Pain is severe and prevented him from sleeping last night. Patient denies fever, chills, or other systemic signs of infection; no hypotension, tachycardia, nausea, vomiting, or vertiginous symptoms. He is in obvious discomfort today; anxious and observed holding jaw. Despite his symptoms, patient has not taken any over the counter interventions to help improve/relieve his reported symptoms at home. Presents HYPERtensive at 180/86; has not taken antihypertensives today. Elevation felt to be related to dental pain. Denies CP, SOB, headaches, palpitations, and visual changes.   Past Medical History:  Diagnosis Date  . Acute ST elevation myocardial infarction (STEMI) of inferior wall ([place]) 07/18/2017  . CAD (coronary artery disease)    a. 03/2016 MV: EF 55-65%, low risk; b. 06/2017 Inf STEMI: LM 85d, LAD 60p, RCA 99ost (3.25x18 Xience [place] DES/3.0x38 [place] DES), RPAV 60; c. 08/2017 PCI dLM/LAD (3.0x38 Xience [place] DES); d. 10/2017 Cath: LM/prox LAD patent, LAD 46m, 40d, RI 70, LCX 40p, RCA patent stent. EF 50-55; e. 08/2019 MV: Low risk EF 55-65%.  . Contrast media allergy   . GERD (gastroesophageal reflux disease)   . Hemorrhoid   . Hyperlipidemia   . Hypertension   . Ischemic cardiomyopathy    a. 06/2017  Echo: EF 45-50%, mod inf/infsept HK. Mildly dil LA; b. 09/2019 Echo: EF 45-50%, Gr2 DD. Mild MR.  Marland Kitchen PAD (peripheral artery disease) ([place])    a. 01/2019 ABIs and Duplex: ABI R 0.78, L 0.79; TBI R 0.66, L 1.11. RCIA >50p, RSFA 50-74. LCIA >50p, LSFA 75-99p, 30-49d. 3 vessel runoff bilat.  Marland Kitchen PAF (paroxysmal atrial fibrillation) ([place])    a. Brief episode during December hospitalization-->converted on IV amio-->not on [place].  Marland Kitchen Stroke [facility]) 2011   "2 MINI STROKES". no deficits.  Marland Kitchen Unstable angina [facility])     Past Surgical History:  Procedure Laterality Date  . ANAL FISTULECTOMY N/A 12/21/2014   Procedure: FISTULECTOMY ANAL;  Surgeon: Christene Lye, MD;  Location: [facility] ORS;  Service: General;  Laterality: N/A;  . APPENDECTOMY  1975  . COLONOSCOPY WITH PROPOFOL N/A 02/12/2019   Procedure: COLONOSCOPY WITH PROPOFOL;  Surgeon: Lucilla Lame, MD;  Location: [facility] ENDOSCOPY;  Service: Endoscopy;  Laterality: N/A;  . CORONARY STENT INTERVENTION N/A 09/09/2017   Procedure: CORONARY STENT INTERVENTION;  Surgeon: Wellington Hampshire, MD;  Location: [place] CV LAB;  Service: Cardiovascular;  Laterality: N/A;  . CORONARY/GRAFT ACUTE MI REVASCULARIZATION N/A 07/18/2017   Procedure: Coronary/Graft Acute MI Revascularization;  Surgeon: Wellington Hampshire, MD;  Location: [place] CV LAB;  Service: Cardiovascular;  Laterality: N/A;  . HEMORRHOID SURGERY N/A 12/21/2014   Procedure: HEMORRHOIDECTOMY;  Surgeon: Christene Lye, MD;  Location: [facility] ORS;  Service: General;  Laterality: N/A;  . INTRAVASCULAR ULTRASOUND/IVUS N/A 09/09/2017   Procedure: Intravascular Ultrasound/IVUS;  Surgeon: Wellington Hampshire, MD;  Location: [place] CV  LAB;  Service: Cardiovascular;  Laterality: N/A;  . KNEE SURGERY Right age 89  . LEFT HEART CATH AND CORONARY ANGIOGRAPHY N/A 07/18/2017   Procedure: LEFT HEART CATH AND CORONARY ANGIOGRAPHY;  Surgeon: Wellington Hampshire, MD;  Location: [place] CV LAB;  Service:  Cardiovascular;  Laterality: N/A;  . LEFT HEART CATH AND CORONARY ANGIOGRAPHY Left 08/19/2017   Procedure: LEFT HEART CATH AND CORONARY ANGIOGRAPHY;  Surgeon: Wellington Hampshire, MD;  Location: [place] CV LAB;  Service: Cardiovascular;  Laterality: Left;  . LEFT HEART CATH AND CORONARY ANGIOGRAPHY N/A 11/07/2017   Procedure: LEFT HEART CATH AND CORONARY ANGIOGRAPHY;  Surgeon: Wellington Hampshire, MD;  Location: [place] CV LAB;  Service: Cardiovascular;  Laterality: N/A;  . ROTATOR CUFF REPAIR Right 2013    Family History  Problem Relation Age of Onset  . Hypertension Mother   . Alzheimer's disease Mother   . Hypertension Father   . Alzheimer's disease Father     Social History   Tobacco Use  . Smoking status: Former Smoker    Types: Cigarettes    Quit date: 07/24/2007    Years since quitting: 12.3  . Smokeless tobacco: Never Used  . Tobacco comment: smoking cessation materials not required  Substance Use Topics  . Alcohol use: Yes    Alcohol/week: 5.0 - 6.0 standard drinks    Types: 5 - 6 Cans of beer per week    Comment:  weekly  . Drug use: No    Patient Active Problem List   Diagnosis Date Noted  . PAD (peripheral artery disease) ([place]) 03/31/2019  . Encounter for screening colonoscopy   . Rectal polyp   . Pain in joint of left shoulder 10/14/2018  . Impingement syndrome of left shoulder region 10/14/2018  . Coronary artery disease of native artery of native heart with stable angina pectoris ([place]) 10/03/2018  . Chronic left shoulder pain 10/03/2018  . Daytime sleepiness 09/23/2017  . History of ST elevation myocardial infarction (STEMI) 07/18/2017  . Hypertension 06/19/2017  . Hyperlipidemia 06/19/2017  . History of CVA (cerebrovascular accident) 06/19/2017  . Overweight (BMI 25.0-29.9) 06/19/2017  . BPH (benign prostatic hyperplasia) 06/19/2017  . ED (erectile dysfunction) 06/19/2017  . MCI (mild cognitive impairment) 06/19/2017  . FH: Alzheimer's disease  06/19/2017  . Lumbar spondylosis with myelopathy 11/08/2016  . Hand dermatitis 07/26/2014    Home Medications:    Current Meds  Medication Sig  . aspirin EC 81 MG tablet Take 81 mg by mouth daily.   Marland Kitchen atorvastatin (LIPITOR) 80 MG tablet TAKE 1 TABLET EVERY DAY AT 6:00PM  . carvedilol (COREG) 3.125 MG tablet Take 1 tablet (3.125 mg total) by mouth 2 (two) times daily with a meal.  . clopidogrel (PLAVIX) 75 MG tablet Take 1 tablet (75 mg total) by mouth daily.  Marland Kitchen ezetimibe (ZETIA) 10 MG tablet TAKE 1 TABLET (10 MG TOTAL) BY MOUTH DAILY.  . furosemide (LASIX) 20 MG tablet Take 1 tablet (20 mg total) by mouth daily.  Marland Kitchen losartan (COZAAR) 100 MG tablet Take 1 tablet (100 mg total) by mouth daily.  . potassium chloride (K-DUR) 10 MEQ tablet Take 1 tablet (10 mEq total) by mouth daily.  . tamsulosin (FLOMAX) 0.4 MG CAPS capsule Take 1 capsule (0.4 mg total) by mouth daily.    Allergies:   Codeine, Shellfish allergy, Contrast media [iodinated diagnostic agents], and Fish allergy  Review of Systems (ROS):  Review of systems NEGATIVE unless otherwise noted in narrative H&P section.  Vital Signs: Today's Vitals   12/13/19 0918 12/13/19 0921 12/13/19 0942  BP:  (!) 180/86   Pulse:  (!) 56   Resp:  16   Temp:  97.8 F (36.6 C)   TempSrc:  Oral   SpO2:  100%   Weight: 187 lb (84.8 kg)    Height: 5\' 7"  (1.702 m)    PainSc: 10-Worst pain ever  10-Worst pain ever    Physical Exam: Physical Exam  Constitutional: He is oriented to person, place, and time and well-developed, well-nourished, and in no distress.  HENT:  Head: Normocephalic and atraumatic.  Mouth/Throat: Oropharynx is clear and moist and mucous membranes are normal. No trismus in the jaw. Abnormal dentition (poor over; missing teeth noted). Dental abscesses (see marked location) present.    (+) associated facial selling noted. No SOB, wheezing, or stridor; no concern for airway compromise.   Eyes: Pupils are equal, round,  and reactive to light.  Cardiovascular: Normal rate and intact distal pulses.  Pulmonary/Chest: Effort normal. No respiratory distress.  Neurological: He is alert and oriented to person, place, and time. Gait normal.  Skin: Skin is warm and dry. No rash noted. He is not diaphoretic.  Psychiatric: Memory, affect and judgment normal. His mood appears anxious.  Nursing note and vitals reviewed.   Urgent Care Treatments / Results:   No orders of the defined types were placed in this encounter.   LABS: PLEASE NOTE: all labs that were ordered this encounter are listed, however only abnormal results are displayed. Labs Reviewed - No data to display  EKG: -None  RADIOLOGY: No results found.  PROCEDURES: Procedures  MEDICATIONS RECEIVED THIS VISIT: Medications - No data to display  PERTINENT CLINICAL COURSE NOTES/UPDATES:   Initial Impression / Assessment and Plan / Urgent Care Course:  Pertinent labs & imaging results that were available during my care of the patient were personally reviewed by me and considered in my medical decision making (see lab/imaging section of note for values and interpretations).  Todd Weaver is a 69 y.o. male who presents to [facility] today with complaints of Dental Pain  Patient is well appearing overall in clinic today. He does not appear to be in any acute distress. Presenting symptoms (see HPI) and exam as documented above. Exam consistent with infection/abscess related to known dental issues. Dentition poor overall. Facial swelling is felt to be directly related to dental infection to  LEFT lower jaw. Exam reveals (+) swelling with no signs of systemic infection; no fevers, nausea, vomiting, tachycardia, or dizziness. Will cover infection with a 7 day course of oral PCN VK. Encouraged warm salt water swishes to help reduce his pain and gingival inflammation. Patient ultimately needs to be seen by dentistry to discuss definitive management. He  was encouraged to contact his dental provider and ask for the next available appointment to be seen in clinic. In the interim, he was advised that he could use concurrent APAP + IBU as needed for pain. Pain is obviously significant AEB objective exam and VS. Will provide a short term supply of Norco for use on a PRN basis for severe pain not controlled by APAP + IBU. Indications and side effects reviewed; no driving emphasized. Advised to be sure to take blood pressure medications as soon as he gets homs.   I have reviewed the follow up and strict return precautions for any new or worsening symptoms. Patient is aware of symptoms that would be deemed urgent/emergent, and would  thus require further evaluation either here or in the emergency department. At the time of discharge, he verbalized understanding and consent with the discharge plan as it was reviewed with him. All questions were fielded by provider and/or clinic staff prior to patient discharge.    Final Clinical Impressions / Urgent Care Diagnoses:   Final diagnoses:  Dental infection  Pain, dental  Elevated blood pressure reading    New Prescriptions:  [place] Controlled Substance Registry consulted? Yes, I have consulted the [place] Controlled Substances Registry for this patient, and feel the risk/benefit ratio today is favorable for proceeding with this prescription for a controlled substance.  . Discussed use of controlled substance medication to treat his acute pain.  o Reviewed [place] STOP Act regulations  o Clinic does not refill controlled substances over the phone without face to face evaluation.  . Safety precautions reviewed.  o Medications should not be bitten, chewed, sold, or taken with alcohol.  o Avoid use while working, driving, or operating heavy machinery.  o Side effects associated with the use of this particular medication reviewed. - Patient understands that this medication can cause CNS depression, increase his risk of falls,  and even lead to overdose that may result in death, if used outside of the parameters that he and I discussed.  With all of this in mind, he knowingly accepts the risks and responsibilities associated with intended course of treatment, and elects to responsibly proceed as discussed. Meds ordered this encounter  Medications  . penicillin v potassium (VEETID) 500 MG tablet    Sig: Take 1 tablet (500 mg total) by mouth 4 (four) times daily for 7 days.    Dispense:  28 tablet    Refill:  0  . HYDROcodone-acetaminophen (NORCO) 5-325 MG tablet    Sig: Take 1 tablet by mouth 3 (three) times daily as needed for moderate pain.    Dispense:  12 tablet    Refill:  0    Recommended Follow up Care:  Patient encouraged to follow up with the following provider within the specified time frame, or sooner as dictated by the severity of his symptoms. As always, he was instructed that for any urgent/emergent care needs, he should seek care either here or in the emergency department for more immediate evaluation.  Follow-up Information    Schedule an appointment as soon as possible for a visit  with Dentist.         NOTE: This note was prepared using Dragon dictation software along with smaller phrase technology. Despite my best ability to proofread, there is the potential that transcriptional errors may still occur from this process, and are completely unintentional.    Karen Kitchens, NP 12/13/19 2226

## 2019-12-13 NOTE — Discharge Instructions (Addendum)
It was very nice seeing you today in clinic. Thank you for entrusting me with your care.   Warm salt water wishes can help with patient. Use medications as directed. Be careful with the pain medication; can make you sleepy.   Make arrangements to follow up with your dentist. If your symptoms/condition worsens, please seek follow up care either here or in the ER. Please remember, our [place] providers are "right here with you" when you need [place].   Again, it was my pleasure to take care of you today. Thank you for choosing our clinic. I hope that you start to feel better quickly.   Honor Loh, MSN, APRN, FNP-C, CEN Advanced Practice Provider [facility]

## 2019-12-31 ENCOUNTER — Ambulatory Visit (INDEPENDENT_AMBULATORY_CARE_PROVIDER_SITE_OTHER): Payer: Medicare HMO | Admitting: Nurse Practitioner

## 2019-12-31 ENCOUNTER — Other Ambulatory Visit: Payer: Self-pay

## 2019-12-31 ENCOUNTER — Encounter: Payer: Self-pay | Admitting: Nurse Practitioner

## 2019-12-31 VITALS — BP 146/78 | HR 52 | Ht 67.0 in | Wt 186.0 lb

## 2019-12-31 DIAGNOSIS — E785 Hyperlipidemia, unspecified: Secondary | ICD-10-CM

## 2019-12-31 DIAGNOSIS — R5383 Other fatigue: Secondary | ICD-10-CM

## 2019-12-31 DIAGNOSIS — I2510 Atherosclerotic heart disease of native coronary artery without angina pectoris: Secondary | ICD-10-CM

## 2019-12-31 DIAGNOSIS — I10 Essential (primary) hypertension: Secondary | ICD-10-CM

## 2019-12-31 DIAGNOSIS — I5032 Chronic diastolic (congestive) heart failure: Secondary | ICD-10-CM | POA: Diagnosis not present

## 2019-12-31 DIAGNOSIS — I739 Peripheral vascular disease, unspecified: Secondary | ICD-10-CM

## 2019-12-31 NOTE — Patient Instructions (Signed)
Medication Instructions:  - Your physician recommends that you continue on your current medications as directed. Please refer to the Current Medication list given to you today.  *If you need a refill on your cardiac medications before your next appointment, please call your pharmacy*   Lab Work: - none ordered  If you have labs (blood work) drawn today and your tests are completely normal, you will receive your results only by: Marland Kitchen MyChart Message (if you have MyChart) OR . A paper copy in the mail If you have any lab test that is abnormal or we need to change your treatment, we will call you to review the results.   Testing/Procedures: - Your physician has requested that you have an ankle brachial index (ABI)- in July. During this test an ultrasound and blood pressure cuff are used to evaluate the arteries that supply the arms and legs with blood. Allow thirty minutes for this exam. There are no restrictions or special instructions.  - Your physician has requested that you have a lower extremity arterial duplex- in July. This test is an ultrasound of the arteries in the legs. It looks at arterial blood flow in the legs. Allow one hour for Lower Arterial scans. There are no restrictions or special instructions   Follow-Up: At [facility], you and your health needs are our priority.  As part of our continuing mission to provide you with exceptional heart care, we have created designated Provider Care Teams.  These Care Teams include your primary Cardiologist (physician) and Advanced Practice Providers (APPs -  Physician Assistants and Nurse Practitioners) who all work together to provide you with the care you need, when you need it.  We recommend signing up for the patient portal called "MyChart".  Sign up information is provided on this After Visit Summary.  MyChart is used to connect with patients for Virtual Visits (Telemedicine).  Patients are able to view lab/test results, encounter  notes, upcoming appointments, etc.  Non-urgent messages can be sent to your provider as well.   To learn more about what you can do with MyChart, go to [email or web address].    Your next appointment:   6 month(s)  The format for your next appointment:   In Person  Provider:    You may see Kathlyn Sacramento, MD or one of the following Advanced Practice Providers on your designated Care Team:    Murray Hodgkins, NP  [name], PA-C  Marrianne Mood, PA-C    Other Instructions - Monitor your blood pressure and heart rate - call the office if: >> your systolic blood pressure (top number) is consistently >130 >> or your heart rate is consistently < 50

## 2019-12-31 NOTE — Progress Notes (Signed)
Office Visit    Patient Name: Todd Weaver Date of Encounter: 12/31/2019  Primary Care Provider:  Glean Hess, MD Primary Cardiologist:  Kathlyn Sacramento, MD  Chief Complaint    69 year old male with a history of CAD status post inferior STEMI with RCA and left main/LAD stenting, peripheral arterial disease with claudication, hypertension, hyperlipidemia, CVA, and GERD, who presents for follow-up related to CHF.  Past Medical History    Past Medical History:  Diagnosis Date  . Acute ST elevation myocardial infarction (STEMI) of inferior wall ([place]) 07/18/2017  . CAD (coronary artery disease)    a. 03/2016 MV: EF 55-65%, low risk; b. 06/2017 Inf STEMI: LM 85d, LAD 60p, RCA 99ost (3.25x18 Xience [place] DES/3.0x38 [place] DES), RPAV 60; c. 08/2017 PCI dLM/LAD (3.0x38 Xience [place] DES); d. 10/2017 Cath: LM/prox LAD patent, LAD 49m, 40d, RI 70, LCX 40p, RCA patent stent. EF 50-55; e. 08/2019 MV: Low risk EF 55-65%.  . Contrast media allergy   . GERD (gastroesophageal reflux disease)   . Hemorrhoid   . Hyperlipidemia   . Hypertension   . Ischemic cardiomyopathy    a. 06/2017 Echo: EF 45-50%, mod inf/infsept HK. Mildly dil LA; b. 09/2019 Echo: EF 45-50%, Gr2 DD. Mild MR.  Marland Kitchen PAD (peripheral artery disease) ([place])    a. 01/2019 ABIs and Duplex: ABI R 0.78, L 0.79; TBI R 0.66, L 1.11. RCIA >50p, RSFA 50-74. LCIA >50p, LSFA 75-99p, 30-49d. 3 vessel runoff bilat.  Marland Kitchen PAF (paroxysmal atrial fibrillation) ([place])    a. Brief episode during December hospitalization-->converted on IV amio-->not on Elliott.  Marland Kitchen Stroke [facility]) 2011   "2 MINI STROKES". no deficits.  Marland Kitchen Unstable angina [facility])    Past Surgical History:  Procedure Laterality Date  . ANAL FISTULECTOMY N/A 12/21/2014   Procedure: FISTULECTOMY ANAL;  Surgeon: Christene Lye, MD;  Location: [facility] ORS;  Service: General;  Laterality: N/A;  . APPENDECTOMY  1975  . CARDIAC CATHETERIZATION    . COLONOSCOPY WITH PROPOFOL N/A 02/12/2019    Procedure: COLONOSCOPY WITH PROPOFOL;  Surgeon: Lucilla Lame, MD;  Location[facility] ENDOSCOPY;  Service: Endoscopy;  Laterality: N/A;  . CORONARY STENT INTERVENTION N/A 09/09/2017   Procedure: CORONARY STENT INTERVENTION;  Surgeon: Wellington Hampshire, MD;  Location: [place] CV LAB;  Service: Cardiovascular;  Laterality: N/A;  . CORONARY/GRAFT ACUTE MI REVASCULARIZATION N/A 07/18/2017   Procedure: Coronary/Graft Acute MI Revascularization;  Surgeon: Wellington Hampshire, MD;  Location: [place] CV LAB;  Service: Cardiovascular;  Laterality: N/A;  . HEMORRHOID SURGERY N/A 12/21/2014   Procedure: HEMORRHOIDECTOMY;  Surgeon: Christene Lye, MD;  Location: [facility] ORS;  Service: General;  Laterality: N/A;  . INTRAVASCULAR ULTRASOUND/IVUS N/A 09/09/2017   Procedure: Intravascular Ultrasound/IVUS;  Surgeon: Wellington Hampshire, MD;  Location: [place] CV LAB;  Service: Cardiovascular;  Laterality: N/A;  . KNEE SURGERY Right age 49  . LEFT HEART CATH AND CORONARY ANGIOGRAPHY N/A 07/18/2017   Procedure: LEFT HEART CATH AND CORONARY ANGIOGRAPHY;  Surgeon: Wellington Hampshire, MD;  Location: [place] CV LAB;  Service: Cardiovascular;  Laterality: N/A;  . LEFT HEART CATH AND CORONARY ANGIOGRAPHY Left 08/19/2017   Procedure: LEFT HEART CATH AND CORONARY ANGIOGRAPHY;  Surgeon: Wellington Hampshire, MD;  Location: [place] CV LAB;  Service: Cardiovascular;  Laterality: Left;  . LEFT HEART CATH AND CORONARY ANGIOGRAPHY N/A 11/07/2017   Procedure: LEFT HEART CATH AND CORONARY ANGIOGRAPHY;  Surgeon: Wellington Hampshire, MD;  Location: [place] CV LAB;  Service: Cardiovascular;  Laterality: N/A;  . ROTATOR CUFF REPAIR Right 2013    Allergies  Allergies  Allergen Reactions  . Codeine Hives  . Shellfish Allergy Hives and Swelling  . Contrast Media [Iodinated Diagnostic Agents] Itching and Other (See Comments)    Whelts on tongue  . Fish Allergy Itching    History of Present Illness      69 year old male with above past medical history including CAD status post inferior STEMI December 2018 requiring 2 overlapping drug-eluting stents to the RCA.  He was also noted to have distal left main and ostial LAD disease at that time.  Periprocedural course was complicated by atrial fibrillation which resolved with amiodarone.  He underwent stage intervention to the left main/LAD in February 2019 with Xience drug-eluting stent placement.  He had recurrent chest pain in April 2019 and underwent diagnostic catheterization revealing stable anatomy with a 70% ramus intermedius stenosis, which has been medically managed.  Other history includes peripheral arterial disease with bilateral nonlifestyle limiting calf claudication (ABIs July 2020-right: 0.78, left 0.79.  Duplex with left greater than right bilateral SFA disease), hypertension, hyperlipidemia, CVA, and GERD.  He most recently underwent stress testing earlier this year following ER evaluation for chest pain.  Stress testing was low risk and without ischemia.  In the setting of dyspnea in February, an echocardiogram was performed and showed an EF of 45 to 50% with grade 2 diastolic dysfunction.  Mild mitral regurgitation was noted.  He required outpatient diuretic adjustments earlier this year but at his last visit in March, he was feeling well without chest pain or significant dyspnea.  Losartan therapy was titrated at his last visit in the setting of ongoing hypertension.  Since his last visit, he has done reasonably well.  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  He notes that he often feels fatigued and has to force himself to do stuff but then is able to carry out these highly exertional activities without experiencing chest pain or dyspnea.  He is also walking up to 1 mile every day.  He is interested in getting back into the gym and exercising in the  pool.  He occasionally notes left axillary discomfort, frequently while lying down, lasting up to 10 minutes.  The symptom is generally worsened if he moves his arm a certain way and there does seem to be a tenderness component to it as well.  He does not experience the symptom with activity.  He has not routinely been checking his blood pressure at home.  Blood pressure mildly elevated this morning however, he did not take any of his blood pressure medications yet.  He notes that in the setting of fatigue, he stopped his carvedilol but has not noticed any improvement in what he describes as fatigue and he plans to resume.  He denies palpitations, PND, orthopnea, dizziness, syncope, edema, or early satiety.  He does have chronic, stable bilateral calf claudication after walking about half to 1 mile.  Home Medications    Prior to Admission medications   Medication Sig Start Date End Date Taking? Authorizing Provider  aspirin EC 81 MG tablet Take 81 mg by mouth daily.  05/23/09   [provider]  atorvastatin (LIPITOR) 80 MG tablet TAKE 1 TABLET EVERY DAY AT 6:00PM 02/25/19   Glean Hess, MD  carvedilol (COREG) 3.125 MG tablet Take 1 tablet (3.125 mg total) by  mouth 2 (two) times daily with a meal. 09/17/19   Marrianne Mood D, PA-C  clopidogrel (PLAVIX) 75 MG tablet Take 1 tablet (75 mg total) by mouth daily. 02/25/19   Glean Hess, MD  ezetimibe (ZETIA) 10 MG tablet TAKE 1 TABLET (10 MG TOTAL) BY MOUTH DAILY. 09/05/19   Glean Hess, MD  furosemide (LASIX) 20 MG tablet Take 1 tablet (20 mg total) by mouth daily. 02/25/19   Glean Hess, MD  HYDROcodone-acetaminophen (NORCO) 5-325 MG tablet Take 1 tablet by mouth 3 (three) times daily as needed for moderate pain. 12/13/19   Karen Kitchens, NP  losartan (COZAAR) 100 MG tablet Take 1 tablet (100 mg total) by mouth daily. 10/01/19   Theora Gianotti, NP  nitroGLYCERIN (NITROSTAT) 0.4 MG SL tablet Place 1 tablet (0.4 mg total)  under the tongue every 5 (five) minutes as needed for chest pain. 02/25/19   Glean Hess, MD  potassium chloride (K-DUR) 10 MEQ tablet Take 1 tablet (10 mEq total) by mouth daily. 02/25/19   Glean Hess, MD  tamsulosin (FLOMAX) 0.4 MG CAPS capsule Take 1 capsule (0.4 mg total) by mouth daily. 02/25/19   Glean Hess, MD    Review of Systems    Persistent, mild fatigue though he is able to perform quite a bit of activity throughout the day without symptoms or limitations.  He has chronic, stable bilateral calf claudication after walking about half to 1 mile.  He notes occasional left-sided/axillary chest discomfort occurring while lying down that is worse with certain position changes and palpation.  He denies dyspnea, PND, orthopnea, dizziness, syncope, edema, early satiety.  All other systems reviewed and are otherwise negative except as noted above.  Physical Exam    VS:  BP (!) 146/78 (BP Location: Left Arm, Patient Position: Sitting, Cuff Size: Normal) Comment: Has not taken meds yet  Pulse (!) 52   Ht 5\' 7"  (1.702 m)   Wt 186 lb (84.4 kg)   SpO2 97%   BMI 29.13 kg/m  , BMI Body mass index is 29.13 kg/m. GEN: Well nourished, well developed, in no acute distress. HEENT: normal. Neck: Supple, no JVD, carotid bruits, or masses. Cardiac: RRR, no murmurs, rubs, or gallops. No clubbing, cyanosis, edema.  Radials 2+/PT 1+ and equal bilaterally.  Respiratory:  Respirations regular and unlabored, diminished breath sounds bilaterally. GI: Soft, nontender, nondistended, BS + x 4. MS: no deformity or atrophy. Skin: warm and dry, no rash. Neuro:  Strength and sensation are intact. Psych: Normal affect.  Accessory Clinical Findings    ECG personally reviewed by me today -sinus bradycardia, 52 - no acute changes.  Lab Results  Component Value Date   WBC 6.0 08/14/2019   HGB 13.8 08/14/2019   HCT 40.3 08/14/2019   MCV 83.3 08/14/2019   PLT 254 08/14/2019   Lab Results    Component Value Date   CREATININE 1.00 10/07/2019   BUN 16 10/07/2019   NA 143 10/07/2019   K 4.1 10/07/2019   CL 107 10/07/2019   CO2 26 10/07/2019   Lab Results  Component Value Date   ALT 34 09/17/2019   AST 31 09/17/2019   ALKPHOS 59 09/17/2019   BILITOT 1.1 09/17/2019   Lab Results  Component Value Date   CHOL 146 09/17/2019   HDL 60 09/17/2019   LDLCALC 77 09/17/2019   LDLDIRECT 116 (H) 09/24/2017   TRIG 36 09/17/2019   CHOLHDL 2.4 09/17/2019  Lab Results  Component Value Date   HGBA1C 5.8 (H) 11/06/2017    Assessment & Plan    1.  Coronary artery disease: Status post prior inferior MI with RCA stenting and subsequent PCI and drug-eluting stent placement to the distal left main/proximal LAD in early 2019.  Low risk stress testing in February of this year.  Most recent echo shows an EF of 45-50% with grade 2 diastolic dysfunction.  He has remained very active since his last visit, fishing, hunting, and working with a Chief Strategy Officer without experiencing chest pain or dyspnea.  He notes some fatigue but then describes his usual day which in addition to working all day, includes walking up to a mile every day.  I advised him that he may require more rest than he is currently taking.  He remains on aspirin, statin, beta-blocker, Plavix, and Zetia therapy.  2.  Chronic diastolic congestive heart failure: Recent EF of 45 to 50% with grade 2 diastolic dysfunction on echocardiogram earlier this year.  Euvolemic on examination and weight is down slightly since his last visit.  Blood pressure is elevated today however, he has yet to take his morning medications.  He notes that he had held his carvedilol a few days to see if it changed his fatigue, but it did not.  He will take all of his morning medicines when he gets home.  He does have a cuff at home and I encouraged him to check his blood pressure and contact us if either his systolic are consistently greater than 130 or heart rates  are less than 50.  Continue ARB therapy.  3.  Fatigue: Patient says that he has trouble getting started in the morning but then goes on to list the numerous activities that he participates in every day.  He notes good exercise/activity tolerance without symptoms or limitations.  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  I suspect, that he may be overdoing it to some extent and encouraged him to reduce some of his activity, especially when it entails being outside in the heat and humidity for long periods of time.  I encouraged adequate hydration.  4.  Peripheral arterial disease: Known, moderate bilateral common iliac and left greater than right SFA disease with three-vessel runoff by duplex last year.  He does have chronic bilateral calf claudication occurring after walking about half to 1 mile and despite this, he typically walks a mile a day.  He remains on aspirin and statin therapy and is due for repeat ABIs next month.  I will order.  5.  Hyperlipidemia: LDL not quite at goal-77.  He remains on statin and Zetia therapy.  Not currently interested in PCSK9 inhibitor.  6.  Alcohol use: Drinking 3-4 beers, 3-4 times per week.  Advised to cut back.  7.  Disposition: Follow-up in clinic in 6 months or sooner if necessary.  Murray Hodgkins, NP 12/31/2019, 12:09 PM

## 2020-01-29 ENCOUNTER — Other Ambulatory Visit: Payer: Self-pay | Admitting: Internal Medicine

## 2020-01-30 NOTE — Telephone Encounter (Signed)
Requested Prescriptions  Pending Prescriptions Disp Refills  . clopidogrel (PLAVIX) 75 MG tablet [Pharmacy Med Name: CLOPIDOGREL 75 MG Tablet] 90 tablet 0    Sig: TAKE 1 TABLET (75 MG TOTAL) BY MOUTH DAILY.     Hematology: Antiplatelets - clopidogrel Failed - 01/29/2020  8:41 PM      Failed - Evaluate AST, ALT within 2 months of therapy initiation.      Passed - ALT in normal range and within 360 days    ALT  Date Value Ref Range Status  09/17/2019 34 0 - 44 IU/L Final         Passed - AST in normal range and within 360 days    AST  Date Value Ref Range Status  09/17/2019 31 0 - 40 IU/L Final         Passed - HCT in normal range and within 180 days    HCT  Date Value Ref Range Status  08/14/2019 40.3 39 - 52 % Final   Hematocrit  Date Value Ref Range Status  03/31/2019 44.1 37.5 - 51.0 % Final         Passed - HGB in normal range and within 180 days    Hemoglobin  Date Value Ref Range Status  08/14/2019 13.8 13.0 - 17.0 g/dL Final  03/31/2019 14.5 13.0 - 17.7 g/dL Final         Passed - PLT in normal range and within 180 days    Platelets  Date Value Ref Range Status  08/14/2019 254 150 - 400 K/uL Final  03/31/2019 309 150 - 450 x10E3/uL Final         Passed - Valid encounter within last 6 months    Recent Outpatient Visits          1 month ago Lateral epicondylitis of left elbow   [facility] Glean Hess, MD   6 months ago Cellulitis of abdominal wall   [facility] Glean Hess, MD   10 months ago Annual physical exam   [facility] Glean Hess, MD   1 year ago Essential hypertension   [facility] Glean Hess, MD   1 year ago Need for pneumococcal vaccination   [facility] Glean Hess, MD      Future Appointments            In 2 months Army Melia Jesse Sans, MD [facility], [place]   In 5 months Wellington Hampshire, MD [facility], [facility]           .  furosemide (LASIX) 20 MG tablet [Pharmacy Med Name: FUROSEMIDE 20 MG Tablet] 90 tablet 0    Sig: TAKE 1 TABLET (20 MG TOTAL) BY MOUTH DAILY.     Cardiovascular:  Diuretics - Loop Failed - 01/29/2020  8:41 PM      Failed - Last BP in normal range    BP Readings from Last 1 Encounters:  12/31/19 (!) 146/78         Passed - K in normal range and within 360 days    Potassium  Date Value Ref Range Status  10/07/2019 4.1 3.5 - 5.1 mmol/L Final         Passed - Ca in normal range and within 360 days    Calcium  Date Value Ref Range Status  10/07/2019 9.2 8.9 - 10.3 mg/dL Final         Passed - Na in  normal range and within 360 days    Sodium  Date Value Ref Range Status  10/07/2019 143 135 - 145 mmol/L Final  09/17/2019 141 134 - 144 mmol/L Final         Passed - Cr in normal range and within 360 days    Creatinine, Ser  Date Value Ref Range Status  10/07/2019 1.00 0.61 - 1.24 mg/dL Final         Passed - Valid encounter within last 6 months    Recent Outpatient Visits          1 month ago Lateral epicondylitis of left elbow   [facility] Glean Hess, MD   6 months ago Cellulitis of abdominal wall   [facility] Glean Hess, MD   10 months ago Annual physical exam   [facility] Glean Hess, MD   1 year ago Essential hypertension   [facility] Glean Hess, MD   1 year ago Need for pneumococcal vaccination   [facility] Glean Hess, MD      Future Appointments            In 2 months Army Melia Jesse Sans, MD [facility], [place]   In 5 months Wellington Hampshire, MD [facility], [facility]           . isosorbide mononitrate (IMDUR) 30 MG 24 hr tablet [Pharmacy Med Name: ISOSORBIDE MONONITRATE ER 30 MG Tablet Extended Release 24 Hour] 90 tablet     Sig: TAKE 1 TABLET (30 MG TOTAL) BY MOUTH DAILY.     Cardiovascular:  Nitrates Failed - 01/29/2020  8:41 PM      Failed - Last  BP in normal range    BP Readings from Last 1 Encounters:  12/31/19 (!) 146/78         Passed - Last Heart Rate in normal range    Pulse Readings from Last 1 Encounters:  12/31/19 (!) 52         Passed - Valid encounter within last 12 months    Recent Outpatient Visits          1 month ago Lateral epicondylitis of left elbow   [facility] Glean [facility], MD   6 months ago Cellulitis of abdominal wall   [facility] Glean Hess, MD   10 months ago Annual physical exam   [facility] Glean Hess, MD   1 year ago Essential hypertension   [facility] Glean Hess, MD   1 year ago Need for pneumococcal vaccination   [facility] Glean Hess, MD      Future Appointments            In 2 months Army Melia Jesse Sans, MD [facility]   In 5 months Wellington Hampshire, MD [facility], [facility]           Signed Prescriptions Disp Refills   tamsulosin (FLOMAX) 0.4 MG CAPS capsule 90 capsule 0    Sig: TAKE 1 CAPSULE (0.4 MG TOTAL) BY MOUTH DAILY.     Urology: Alpha-Adrenergic Blocker Failed - 01/29/2020  8:41 PM      Failed - Last BP in normal range    BP Readings from Last 1 Encounters:  12/31/19 (!) 146/78         Passed - Valid encounter within last 12 months    Recent Outpatient  Visits          1 month ago Lateral epicondylitis of left elbow   [facility] Glean Hess, MD   6 months ago Cellulitis of abdominal wall   [facility] Glean Hess, MD   10 months ago Annual physical exam   [facility] Glean Hess, MD   1 year ago Essential hypertension   [facility] Glean Hess, MD   1 year ago Need for pneumococcal vaccination   [facility] Gle[facility] Hess, MD      Future Appointments            In 2 months Army Melia Jesse Sans, MD [facility], [place]   In 5 months Wellington Hampshire, MD [facility], [facility]            potassium chloride (KLOR-CON) 10 MEQ tablet 90 tablet 0    Sig: TAKE 1 TABLET (10 MEQ TOTAL) BY MOUTH DAILY.     Endocrinology:  Minerals - Potassium Supplementation Passed - 01/29/2020  8:41 PM      Passed - K in normal range and within 360 days    Potassium  Date Value Ref Range Status  10/07/2019 4.1 3.5 - 5.1 mmol/L Final         Passed - Cr in normal range and within 360 days    Creatinine, Ser  Date Value Ref Range Status  10/07/2019 1.00 0.61 - 1.24 mg/dL Final         Passed - Valid encounter within last 12 months    Recent Outpatient Visits          1 month ago Lateral epicondylitis of left elbow   [facility] Glean Hess, MD   6 months ago Cellulitis of abdominal wall   [facility] Glean Hess, MD   10 months ago Annual physical exam   [facility] Glean Hess, MD   1 year ago Essential hypertension   [facility] Glean Hess, MD   1 year ago Need for pneumococcal vaccination   [facility] Glean Hess, MD      Future Appointments            In 2 months Army Melia Jesse Sans, MD [facility], [place]   In 5 months Wellington Hampshire, MD [facility], [place]

## 2020-02-04 ENCOUNTER — Other Ambulatory Visit: Payer: Self-pay | Admitting: Internal Medicine

## 2020-02-04 DIAGNOSIS — E785 Hyperlipidemia, unspecified: Secondary | ICD-10-CM

## 2020-02-05 ENCOUNTER — Other Ambulatory Visit: Payer: Self-pay

## 2020-02-05 ENCOUNTER — Ambulatory Visit (INDEPENDENT_AMBULATORY_CARE_PROVIDER_SITE_OTHER): Payer: Medicare HMO

## 2020-02-05 DIAGNOSIS — I739 Peripheral vascular disease, unspecified: Secondary | ICD-10-CM | POA: Diagnosis not present

## 2020-02-09 ENCOUNTER — Telehealth: Payer: Self-pay

## 2020-02-09 NOTE — Telephone Encounter (Signed)
Call to patient to review abi results.    detailed message left with result, okay per DPR. Marland Kitchen    Advised pt to call for any further questions or concerns.  No further orders.

## 2020-02-09 NOTE — Telephone Encounter (Signed)
-----   Message from Theora Gianotti, NP sent at 02/08/2020  7:15 AM EDT ----- Stable ABI's compared to last year.  F/u ABI in 1 yr.

## 2020-03-21 ENCOUNTER — Other Ambulatory Visit: Payer: Self-pay | Admitting: Internal Medicine

## 2020-03-21 NOTE — Telephone Encounter (Signed)
appt in 3 weeks

## 2020-04-11 ENCOUNTER — Encounter: Payer: Self-pay | Admitting: Internal Medicine

## 2020-04-11 ENCOUNTER — Ambulatory Visit (INDEPENDENT_AMBULATORY_CARE_PROVIDER_SITE_OTHER): Payer: Medicare HMO | Admitting: Internal Medicine

## 2020-04-11 ENCOUNTER — Other Ambulatory Visit: Payer: Self-pay

## 2020-04-11 VITALS — BP 138/80 | HR 53 | Temp 97.8°F | Ht 67.0 in | Wt 186.0 lb

## 2020-04-11 DIAGNOSIS — K5904 Chronic idiopathic constipation: Secondary | ICD-10-CM | POA: Diagnosis not present

## 2020-04-11 DIAGNOSIS — G5601 Carpal tunnel syndrome, right upper limb: Secondary | ICD-10-CM

## 2020-04-11 DIAGNOSIS — N401 Enlarged prostate with lower urinary tract symptoms: Secondary | ICD-10-CM

## 2020-04-11 DIAGNOSIS — Z23 Encounter for immunization: Secondary | ICD-10-CM | POA: Diagnosis not present

## 2020-04-11 DIAGNOSIS — E785 Hyperlipidemia, unspecified: Secondary | ICD-10-CM | POA: Diagnosis not present

## 2020-04-11 DIAGNOSIS — I10 Essential (primary) hypertension: Secondary | ICD-10-CM

## 2020-04-11 DIAGNOSIS — Z125 Encounter for screening for malignant neoplasm of prostate: Secondary | ICD-10-CM | POA: Diagnosis not present

## 2020-04-11 DIAGNOSIS — R351 Nocturia: Secondary | ICD-10-CM | POA: Diagnosis not present

## 2020-04-11 DIAGNOSIS — I480 Paroxysmal atrial fibrillation: Secondary | ICD-10-CM | POA: Diagnosis not present

## 2020-04-11 DIAGNOSIS — Z0000 Encounter for general adult medical examination without abnormal findings: Secondary | ICD-10-CM

## 2020-04-11 LAB — POCT URINALYSIS DIPSTICK
Bilirubin, UA: NEGATIVE
Blood, UA: NEGATIVE
Glucose, UA: NEGATIVE
Ketones, UA: NEGATIVE
Leukocytes, UA: NEGATIVE
Nitrite, UA: NEGATIVE
Protein, UA: NEGATIVE
Spec Grav, UA: 1.015 (ref 1.010–1.025)
Urobilinogen, UA: 0.2 E.U./dL
pH, UA: 5 (ref 5.0–8.0)

## 2020-04-11 NOTE — Patient Instructions (Addendum)
Try taking a Probiotic for several weeks.   Any of them are fine - see what's on sale.  For constipation, try Miralax powder - take one capful per day in a bottle of water or tea.  Get a wrist splint and wear it on your right wrist while sleeping for suspected Carpal tunnel syndrome.  Langston Reusing, MD  [address]  [place], [place] [address]  [phone]  [phone] (Fax)    Scarlett Presto, [facility] to see about your hand [address]  [place], [place] [address]  [phone]  [phone] (Fax)

## 2020-04-11 NOTE — Progress Notes (Signed)
Date:  04/11/2020   Name:  Todd Weaver   DOB:  [birth year]   MRN:  [id number]   Chief Complaint: Annual Exam and Flu Vaccine  Todd Weaver is a 69 y.o. male who presents today for his Complete Annual Exam. He feels fairly well. He reports exercising at work . He reports he is sleeping poorly.   Colonoscopy: 01/2019  Immunization History  Administered Date(s) Administered  . Fluad Quad(high Dose 65+) 03/31/2019, 04/11/2020  . Influenza,inj,Quad PF,6+ Mos 06/19/2017, 03/28/2018  . PFIZER SARS-COV-2 Vaccination 10/01/2019, 10/27/2019  . Pneumococcal Conjugate-13 03/28/2018  . Pneumococcal Polysaccharide-23 12/09/2019  . Tdap 06/19/2017    Hypertension This is a chronic problem. The problem is controlled. Pertinent negatives include no chest pain, headaches, palpitations or shortness of breath. Past treatments include beta blockers. The current treatment provides significant improvement. Hypertensive end-organ damage includes CAD/MI.  Hyperlipidemia This is a chronic problem. The problem is controlled. Pertinent negatives include no chest pain, myalgias or shortness of breath. Current antihyperlipidemic treatment includes ezetimibe and statins. The current treatment provides significant improvement of lipids.  Benign Prostatic Hypertrophy This is a chronic problem. Irritative symptoms include frequency. Irritative symptoms do not include urgency. Pertinent negatives include no chills, dysuria or hematuria. Past treatments include tamsulosin.  Constipation This is a new problem. The stool is described as pellet like. The patient is not on a high fiber diet. He exercises regularly. There has been adequate water intake. Associated symptoms include abdominal pain. Pertinent negatives include no anorexia, back pain, diarrhea (X1 month ) or difficulty urinating.  Numbness in fingers of right hand - over the past few weeks noticed numbness in his right hand.  Worse at times such as during  the night.  Also triggering of his middle finger has recurred.  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   Lab Results  Component Value Date   CREATININE 1.00 10/07/2019   BUN 16 10/07/2019   NA 143 10/07/2019   K 4.1 10/07/2019   CL 107 10/07/2019   CO2 26 10/07/2019   Lab Results  Component Value Date   CHOL 146 09/17/2019   HDL 60 09/17/2019   LDLCALC 77 09/17/2019   LDLDIRECT 116 (H) 09/24/2017   TRIG 36 09/17/2019   CHOLHDL 2.4 09/17/2019   Lab Results  Component Value Date   TSH 1.232 07/19/2017   Lab Results  Component Value Date   HGBA1C 5.8 (H) 11/06/2017   Lab Results  Component Value Date   WBC 6.0 08/14/2019   HGB 13.8 08/14/2019   HCT 40.3 08/14/2019   MCV 83.3 08/14/2019   PLT 254 08/14/2019   Lab Results  Component Value Date   ALT 34 09/17/2019   AST 31 09/17/2019   ALKPHOS 59 09/17/2019   BILITOT 1.1 09/17/2019     Review of Systems  Constitutional: Positive for appetite change (X1 month ). Negative for chills, diaphoresis, fatigue and unexpected weight change.  HENT: Negative for hearing loss, tinnitus, trouble swallowing and voice change.   Eyes: Negative for visual disturbance.  Respiratory: Negative for choking, shortness of breath and wheezing.   Cardiovascular: Negative for chest pain, palpitations and leg swelling.  Gastrointestinal: Positive for abdominal pain and constipation. Negative for abdominal distention, anal bleeding, anorexia, blood in stool and diarrhea (X1 month ).  Genitourinary: Positive for frequency. Negative for difficulty urinating, dysuria, hematuria and urgency.  Musculoskeletal: Positive for arthralgias (trigger finger right middle). Negative  for back pain and myalgias.  Skin: Negative for color change and rash.  Neurological: Positive for numbness. Negative for dizziness, syncope and headaches.  Hematological: Negative for adenopathy.  Psychiatric/Behavioral:  Positive for sleep disturbance. Negative for dysphoric mood. The patient is not nervous/anxious.     Patient Active Problem List   Diagnosis Date Noted  . PAD (peripheral artery disease) ([place]) 03/31/2019  . Encounter for screening colonoscopy   . Rectal polyp   . Pain in joint of left shoulder 10/14/2018  . Impingement syndrome of left shoulder region 10/14/2018  . Coronary artery disease of native artery of native heart with stable angina pectoris ([place]) 10/03/2018  . Chronic left shoulder pain 10/03/2018  . Daytime sleepiness 09/23/2017  . History of ST elevation myocardial infarction (STEMI) 07/18/2017  . Hypertension 06/19/2017  . Hyperlipidemia 06/19/2017  . History of CVA (cerebrovascular accident) 06/19/2017  . Overweight (BMI 25.0-29.9) 06/19/2017  . BPH (benign prostatic hyperplasia) 06/19/2017  . ED (erectile dysfunction) 06/19/2017  . MCI (mild cognitive impairment) 06/19/2017  . FH: Alzheimer's disease 06/19/2017  . Lumbar spondylosis with myelopathy 11/08/2016  . Hand dermatitis 07/26/2014    Allergies  Allergen Reactions  . Codeine Hives  . Shellfish Allergy Hives and Swelling  . Contrast Media [Iodinated Diagnostic Agents] Itching and Other (See Comments)    Whelts on tongue  . Fish Allergy Itching    Past Surgical History:  Procedure Laterality Date  . ANAL FISTULECTOMY N/A 12/21/2014   Procedure: FISTULECTOMY ANAL;  Surgeon: Christene Lye, MD;  Location: [facility] ORS;  Service: General;  Laterality: N/A;  . APPENDECTOMY  1975  . CARDIAC CATHETERIZATION    . COLONOSCOPY WITH PROPOFOL N/A 02/12/2019   Procedure: COLONOSCOPY WITH PROPOFOL;  Surgeon: Lucilla Lame, MD;  Location: [facility] ENDOSCOPY;  Service: Endoscopy;  Laterality: N/A;  . CORONARY STENT INTERVENTION N/A 09/09/2017   Procedure: CORONARY STENT INTERVENTION;  Surgeon: Wellington Hampshire, MD;  Location: [place] CV LAB;  Service: Cardiovascular;  Laterality: N/A;  . CORONARY/GRAFT ACUTE MI  REVASCULARIZATION N/A 07/18/2017   Procedure: Coronary/Graft Acute MI Revascularization;  Surgeon: Wellington Hampshire, MD;  Location: [place] CV LAB;  Service: Cardiovascular;  Laterality: N/A;  . HEMORRHOID SURGERY N/A 12/21/2014   Procedure: HEMORRHOIDECTOMY;  Surgeon: Christene Lye, MD;  Location: [facility] ORS;  Service: General;  Laterality: N/A;  . INTRAVASCULAR ULTRASOUND/IVUS N/A 09/09/2017   Procedure: Intravascular Ultrasound/IVUS;  Surgeon: Wellington Hampshire, MD;  Location: [place] CV LAB;  Service: Cardiovascular;  Laterality: N/A;  . KNEE SURGERY Right age 38  . LEFT HEART CATH AND CORONARY ANGIOGRAPHY N/A 07/18/2017   Procedure: LEFT HEART CATH AND CORONARY ANGIOGRAPHY;  Surgeon: Wellington Hampshire, MD;  Location: [place] CV LAB;  Service: Cardiovascular;  Laterality: N/A;  . LEFT HEART CATH AND CORONARY ANGIOGRAPHY Left 08/19/2017   Procedure: LEFT HEART CATH AND CORONARY ANGIOGRAPHY;  Surgeon: Wellington Hampshire, MD;  Location: [place] CV LAB;  Service: Cardiovascular;  Laterality: Left;  . LEFT HEART CATH AND CORONARY ANGIOGRAPHY N/A 11/07/2017   Procedure: LEFT HEART CATH AND CORONARY ANGIOGRAPHY;  Surgeon: Wellington Hampshire, MD;  Location: [place] CV LAB;  Service: Cardiovascular;  Laterality: N/A;  . ROTATOR CUFF REPAIR Right 2013    Social History   Tobacco Use  . Smoking status: Former Smoker    Types: Cigarettes    Quit date: 07/24/2007    Years since quitting: 12.7  . Smokeless tobacco: Never Used  . Tobacco comment:  smoking cessation materials not required  Vaping Use  . Vaping Use: Never used  Substance Use Topics  . Alcohol use: Yes    Alcohol/week: 5.0 - 6.0 standard drinks    Types: 5 - 6 Cans of beer per week    Comment:  weekly  . Drug use: No     Medication list has been reviewed and updated.  Current Meds  Medication Sig  . aspirin EC 81 MG tablet Take 81 mg by mouth daily.   Marland Kitchen atorvastatin (LIPITOR) 80 MG tablet TAKE 1  TABLET EVERY DAY AT 6:00PM  . carvedilol (COREG) 3.125 MG tablet Take 1 tablet (3.125 mg total) by mouth 2 (two) times daily with a meal.  . clopidogrel (PLAVIX) 75 MG tablet TAKE 1 TABLET (75 MG TOTAL) BY MOUTH DAILY.  Marland Kitchen ezetimibe (ZETIA) 10 MG tablet TAKE 1 TABLET EVERY DAY  . furosemide (LASIX) 20 MG tablet TAKE 1 TABLET (20 MG TOTAL) BY MOUTH DAILY.  Marland Kitchen losartan (COZAAR) 100 MG tablet Take 1 tablet (100 mg total) by mouth daily. (Patient taking differently: Take 25 mg by mouth daily. )  . nitroGLYCERIN (NITROSTAT) 0.4 MG SL tablet Place 1 tablet (0.4 mg total) under the tongue every 5 (five) minutes as needed for chest pain.  . potassium chloride (KLOR-CON) 10 MEQ tablet TAKE 1 TABLET (10 MEQ TOTAL) BY MOUTH DAILY.  . tamsulosin (FLOMAX) 0.4 MG CAPS capsule TAKE 1 CAPSULE (0.4 MG TOTAL) BY MOUTH DAILY.    PHQ 2/9 Scores 12/11/2019 12/09/2019 07/08/2019 03/31/2019  PHQ - 2 Score 0 0 0 0  PHQ- 9 Score 0 - - -    GAD 7 : Generalized Anxiety Score 12/11/2019  Nervous, Anxious, on Edge 0  Control/stop worrying 0  Worry too much - different things 0  Trouble relaxing 0  Restless 0  Easily annoyed or irritable 0  Afraid - awful might happen 0  Total GAD 7 Score 0  Anxiety Difficulty Not difficult at all    BP Readings from Last 3 Encounters:  04/11/20 138/80  12/31/19 (!) 146/78  12/13/19 (!) 180/86    Physical Exam Vitals and nursing note reviewed.  Constitutional:      Appearance: Normal appearance. He is well-developed.  HENT:     Head: Normocephalic.     Right Ear: Tympanic membrane, ear canal and external ear normal.     Left Ear: Tympanic membrane, ear canal and external ear normal.     Nose: Nose normal.     Mouth/Throat:     Pharynx: Uvula midline.  Eyes:     Conjunctiva/sclera: Conjunctivae normal.     Pupils: Pupils are equal, round, and reactive to light.  Neck:     Thyroid: No thyromegaly.     Vascular: No carotid bruit.  Cardiovascular:     Rate and Rhythm:  Normal rate and regular rhythm.     Heart sounds: Normal heart sounds.  Pulmonary:     Effort: Pulmonary effort is normal.     Breath sounds: Normal breath sounds. No wheezing.  Chest:     Breasts:        Right: No mass.        Left: No mass.  Abdominal:     General: Abdomen is protuberant. Bowel sounds are normal.     Palpations: Abdomen is soft.     Tenderness: There is generalized abdominal tenderness. There is no right CVA tenderness, left CVA tenderness, guarding or rebound.  Musculoskeletal:  General: Normal range of motion.     Cervical back: Normal range of motion and neck supple.     Right lower leg: No edema.     Left lower leg: No edema.  Lymphadenopathy:     Cervical: No cervical adenopathy.  Skin:    General: Skin is warm and dry.     Capillary Refill: Capillary refill takes less than 2 seconds.  Neurological:     General: No focal deficit present.     Mental Status: He is alert and oriented to person, place, and time.     Motor: Motor function is intact.     Coordination: Coordination is intact.     Deep Tendon Reflexes: Reflexes are normal and symmetric.     Comments: Tinels and phalens +on right  Psychiatric:        Speech: Speech normal.        Behavior: Behavior normal.        Thought Content: Thought content normal.        Judgment: Judgment normal.     Wt Readings from Last 3 Encounters:  04/11/20 186 lb (84.4 kg)  12/31/19 186 lb (84.4 kg)  12/13/19 187 lb (84.8 kg)    BP 138/80   Pulse (!) 53   Temp 97.8 F (36.6 C) (Oral)   Ht 5\' 7"  (1.702 m)   Wt 186 lb (84.4 kg)   SpO2 96%   BMI 29.13 kg/m   Assessment and Plan: 1. Annual physical exam - POCT urinalysis dipstick  2. Prostate cancer screening  3. Essential hypertension Clinically stable exam with well controlled BP. Tolerating medications without side effects at this time. Pt to continue current regimen and low sodium diet; benefits of regular exercise as able discussed. -  CBC with Differential/Platelet - Comprehensive metabolic panel  4. Hyperlipidemia, unspecified hyperlipidemia type Tolerating statin medication without side effects at this time LDL is at goal of < 70 on current dose Continue same therapy without change at this time. - Lipid panel  5. Benign prostatic hyperplasia with nocturia DRE deferred - PSA  6. Carpal tunnel syndrome of right wrist Recommend wrist splint Follow up with Orthopedics about this and trigger finger  7. Need for immunization against influenza - Flu Vaccine QUAD High Dose(Fluad)  8. PAF (paroxysmal atrial fibrillation) ([place]) Followed by Cardiology On Plavix and aspirin  9. Chronic idiopathic constipation Trial of probiotics and Miralax   Partially dictated using Editor, commissioning. Any errors are unintentional.  Halina Maidens, MD [facility]  04/11/2020

## 2020-04-12 LAB — CBC WITH DIFFERENTIAL/PLATELET
Basophils Absolute: 0.1 10*3/uL (ref 0.0–0.2)
Basos: 1 %
EOS (ABSOLUTE): 0.4 10*3/uL (ref 0.0–0.4)
Eos: 6 %
Hematocrit: 42.9 % (ref 37.5–51.0)
Hemoglobin: 14.2 g/dL (ref 13.0–17.7)
Immature Grans (Abs): 0 10*3/uL (ref 0.0–0.1)
Immature Granulocytes: 0 %
Lymphocytes Absolute: 3.2 10*3/uL — ABNORMAL HIGH (ref 0.7–3.1)
Lymphs: 49 %
MCH: 28.4 pg (ref 26.6–33.0)
MCHC: 33.1 g/dL (ref 31.5–35.7)
MCV: 86 fL (ref 79–97)
Monocytes Absolute: 0.7 10*3/uL (ref 0.1–0.9)
Monocytes: 11 %
Neutrophils Absolute: 2.1 10*3/uL (ref 1.4–7.0)
Neutrophils: 33 %
Platelets: 306 10*3/uL (ref 150–450)
RBC: 5 x10E6/uL (ref 4.14–5.80)
RDW: 14.2 % (ref 11.6–15.4)
WBC: 6.4 10*3/uL (ref 3.4–10.8)

## 2020-04-12 LAB — COMPREHENSIVE METABOLIC PANEL
ALT: 29 IU/L (ref 0–44)
AST: 24 IU/L (ref 0–40)
Albumin/Globulin Ratio: 1.6 (ref 1.2–2.2)
Albumin: 4.6 g/dL (ref 3.8–4.8)
Alkaline Phosphatase: 65 IU/L (ref 44–121)
BUN/Creatinine Ratio: 13 (ref 10–24)
BUN: 14 mg/dL (ref 8–27)
Bilirubin Total: 1.3 mg/dL — ABNORMAL HIGH (ref 0.0–1.2)
CO2: 21 mmol/L (ref 20–29)
Calcium: 9.8 mg/dL (ref 8.6–10.2)
Chloride: 103 mmol/L (ref 96–106)
Creatinine, Ser: 1.1 mg/dL (ref 0.76–1.27)
GFR calc Af Amer: 80 mL/min/{1.73_m2} (ref >59)
GFR calc non Af Amer: 69 mL/min/{1.73_m2} (ref >59)
Globulin, Total: 2.8 g/dL (ref 1.5–4.5)
Glucose: 111 mg/dL — ABNORMAL HIGH (ref 65–99)
Potassium: 4.5 mmol/L (ref 3.5–5.2)
Sodium: 140 mmol/L (ref 134–144)
Total Protein: 7.4 g/dL (ref 6.0–8.5)

## 2020-04-12 LAB — PSA: Prostate Specific Ag, Serum: 1.5 ng/mL (ref 0.0–4.0)

## 2020-04-12 LAB — LIPID PANEL
Chol/HDL Ratio: 2.5 ratio (ref 0.0–5.0)
Cholesterol, Total: 153 mg/dL (ref 100–199)
HDL: 61 mg/dL (ref >39)
LDL Chol Calc (NIH): 82 mg/dL (ref 0–99)
Triglycerides: 44 mg/dL (ref 0–149)
VLDL Cholesterol Cal: 10 mg/dL (ref 5–40)

## 2020-04-25 ENCOUNTER — Other Ambulatory Visit: Payer: Self-pay | Admitting: Internal Medicine

## 2020-04-25 NOTE — Telephone Encounter (Signed)
Requested Prescriptions  Pending Prescriptions Disp Refills  . potassium chloride (KLOR-CON) 10 MEQ tablet [Pharmacy Med Name: POTASSIUM CHLORIDE ER 10 MEQ Tablet Extended Release] 90 tablet 3    Sig: TAKE 1 TABLET (10 MEQ TOTAL) BY MOUTH DAILY.     Endocrinology:  Minerals - Potassium Supplementation Passed - 04/25/2020  5:10 PM      Passed - K in normal range and within 360 days    Potassium  Date Value Ref Range Status  04/11/2020 4.5 3.5 - 5.2 mmol/L Final         Passed - Cr in normal range and within 360 days    Creatinine, Ser  Date Value Ref Range Status  04/11/2020 1.10 0.76 - 1.27 mg/dL Final         Passed - Valid encounter within last 12 months    Recent Outpatient Visits          2 weeks ago Annual physical exam   [facility] Glean Hess, MD   4 months ago Lateral epicondylitis of left elbow   [facility] Glean Hess, MD   9 months ago Cellulitis of abdominal wall   [facility] Glean Hess, MD   1 year ago Annual physical exam   [facility] Glean Hess, MD   1 year ago Essential hypertension   [place], [place]      Future Appointments            In 2 months Wellington Hampshire, MD [facility], [facility]   In 5 months Army Melia, Jesse Sans, MD [facility], [place]   In 11 months Glean Hess, MD [facility], [facility]           . tamsulosin (FLOMAX) 0.4 MG CAPS capsule [Pharmacy Med Name: TAMSULOSIN HYDROCHLORIDE 0.4 MG Capsule] 90 capsule 0    Sig: TAKE 1 CAPSULE (0.4 MG TOTAL) BY MOUTH DAILY.     Urology: Alpha-Adrenergic Blocker Passed - 04/25/2020  5:10 PM      Passed - Last BP in normal range    BP Readings from Last 1 Encounters:  04/11/20 138/80         Passed - Valid encounter within last 12 months    Recent Outpatient Visits          2 weeks ago Annual physical exam   [facility] Glean Hess, MD   4 months  ago Lateral epicondylitis of left elbow   [facility] Glean Hess, MD   9 months ago Cellulitis of abdominal wall   [facility] Glean Hess, MD   1 year ago Annual physical exam   [facility] Glean Hess, MD   1 year ago Essential hypertension   [facility] Glean Hess, MD      Future Appointments            In 2 months Wellington Hampshire, MD [facility], [facility]   In 5 months Army Melia Jesse Sans, MD [facility], [place]   In 11 months Glean Hess, MD [facility], [facility]           . furosemide (LASIX) 20 MG tablet [Pharmacy Med Name: FUROSEMIDE 20 MG Tablet] 90 tablet 0    Sig: TAKE 1 TABLET (20 MG TOTAL) BY MOUTH DAILY.     Cardiovascular:  Diuretics - Loop Passed - 04/25/2020  5:10 PM      Passed - K in normal range and within 360 days    Potassium  Date Value Ref Range Status  04/11/2020 4.5 3.5 - 5.2 mmol/L Final         Passed - Ca in normal range and within 360 days    Calcium  Date Value Ref Range Status  04/11/2020 9.8 8.6 - 10.2 mg/dL Final         Passed - Na in normal range and within 360 days    Sodium  Date Value Ref Range Status  04/11/2020 140 134 - 144 mmol/L Final         Passed - Cr in normal range and within 360 days    Creatinine, Ser  Date Value Ref Range Status  04/11/2020 1.10 0.76 - 1.27 mg/dL Final         Passed - Last BP in normal range    BP Readings from Last 1 Encounters:  04/11/20 138/80         Passed - Valid encounter within last 6 months    Recent Outpatient Visits          2 weeks ago Annual physical exam   [facility] Glean Hess, MD   4 months ago Lateral epicondylitis of left elbow   [facility] Glean Hess, MD   9 months ago Cellulitis of abdominal wall   [facility] Glean Hess, MD   1 year ago Annual physical exam   [facility] Glean Hess, MD   1 year  ago Essential hypertension   [facility] Glean Hess, MD      Future Appointments            In 2 months Wellington Hampshire, MD [facility], [facility]   In 5 months Army Melia, Jesse Sans, MD [facility], [place]   In 11 months Army Melia Jesse Sans, MD [facility], [place]           . clopidogrel (PLAVIX) 75 MG tablet [Pharmacy Med Name: CLOPIDOGREL 75 MG Tablet] 90 tablet 0    Sig: TAKE 1 TABLET (75 MG TOTAL) BY MOUTH DAILY.     Hematology: Antiplatelets - clopidogrel Failed - 04/25/2020  5:10 PM      Failed - Evaluate AST, ALT within 2 months of therapy initiation.      Passed - ALT in normal range and within 360 days    ALT  Date Value Ref Range Status  04/11/2020 29 0 - 44 IU/L Final         Passed - AST in normal range and within 360 days    AST  Date Value Ref Range Status  04/11/2020 24 0 - 40 IU/L Final         Passed - HCT in normal range and within 180 days    Hematocrit  Date Value Ref Range Status  04/11/2020 42.9 37.5 - 51.0 % Final         Passed - HGB in normal range and within 180 days    Hemoglobin  Date Value Ref Range Status  04/11/2020 14.2 13.0 - 17.7 g/dL Final         Passed - PLT in normal range and within 180 days    Platelets  Date Value Ref Range Status  04/11/2020 306 150 - 450 x10E3/uL Final         Passed - Valid  encounter within last 6 months    Recent Outpatient Visits          2 weeks ago Annual physical exam   [facility] Glean Hess, MD   4 months ago Lateral epicondylitis of left elbow   [facility] Glean Hess, MD   9 months ago Cellulitis of abdominal wall   [facility] Glean Hess, MD   1 year ago Annual physical exam   [facility] Glean Hess, MD   1 year ago Essential hypertension   [facility] Glean Hess, MD      Future Appointments            In 2 months Wellington Hampshire, MD [facility], [facility]   In 5 months Army Melia, Jesse Sans, MD [facility], [place]   In 11 months Army Melia Jesse Sans, MD [facility], [place]           . atorvastatin (LIPITOR) 80 MG tablet [Pharmacy Med Name: ATORVASTATIN CALCIUM 80 MG Tablet] 90 tablet 0    Sig: TAKE 1 TABLET EVERY DAY AT 6:00PM     Cardiovascular:  Antilipid - Statins Failed - 04/25/2020  5:10 PM      Failed - LDL in normal range and within 360 days    LDL Chol Calc (NIH)  Date Value Ref Range Status  04/11/2020 82 0 - 99 mg/dL Final   LDL Direct  Date Value Ref Range Status  09/24/2017 116 (H) 0 - 99 mg/dL Final         Passed - Total Cholesterol in normal range and within 360 days    Cholesterol, Total  Date Value Ref Range Status  04/11/2020 153 100 - 199 mg/dL Final         Passed - HDL in normal range and within 360 days    HDL  Date Value Ref Range Status  04/11/2020 61 >39 mg/dL Final         Passed - Triglycerides in normal range and within 360 days    Triglycerides  Date Value Ref Range Status  04/11/2020 44 0 - 149 mg/dL Final         Passed - Patient is not pregnant      Passed - Valid encounter within last 12 months    Recent Outpatient Visits          2 weeks ago Annual physical exam   [facility] Glean Hess, MD   4 months ago Lateral epicondylitis of left elbow   [facility] Glean Hess, MD   9 months ago Cellulitis of abdominal wall   [facility] Glean Hess, MD   1 year ago Annual physical exam   [facility] Glean Hess, MD   1 year ago Essential hypertension   [facility] Glean Hess, MD      Future Appointments            In 2 months Wellington Hampshire, MD [facility], [facility]   In 5 months Army Melia, Jesse Sans, MD [facility], [place]   In 11 months Army Melia, Jesse Sans, MD [facility], [facility]

## 2020-04-28 ENCOUNTER — Other Ambulatory Visit: Payer: Self-pay | Admitting: Internal Medicine

## 2020-04-28 DIAGNOSIS — E785 Hyperlipidemia, unspecified: Secondary | ICD-10-CM

## 2020-05-11 DIAGNOSIS — H903 Sensorineural hearing loss, bilateral: Secondary | ICD-10-CM | POA: Diagnosis not present

## 2020-06-13 DIAGNOSIS — M65331 Trigger finger, right middle finger: Secondary | ICD-10-CM | POA: Diagnosis not present

## 2020-06-30 ENCOUNTER — Encounter: Payer: Self-pay | Admitting: Cardiovascular Disease

## 2020-06-30 ENCOUNTER — Ambulatory Visit: Payer: Medicare HMO | Admitting: Cardiovascular Disease

## 2020-06-30 ENCOUNTER — Other Ambulatory Visit: Payer: Self-pay

## 2020-06-30 VITALS — BP 140/80 | HR 53 | Ht 67.0 in | Wt 188.0 lb

## 2020-06-30 DIAGNOSIS — I10 Essential (primary) hypertension: Secondary | ICD-10-CM

## 2020-06-30 DIAGNOSIS — I5022 Chronic systolic (congestive) heart failure: Secondary | ICD-10-CM | POA: Diagnosis not present

## 2020-06-30 DIAGNOSIS — E785 Hyperlipidemia, unspecified: Secondary | ICD-10-CM

## 2020-06-30 DIAGNOSIS — I739 Peripheral vascular disease, unspecified: Secondary | ICD-10-CM

## 2020-06-30 DIAGNOSIS — I2510 Atherosclerotic heart disease of native coronary artery without angina pectoris: Secondary | ICD-10-CM

## 2020-06-30 MED ORDER — AMLODIPINE BESYLATE 5 MG PO TABS: 5.0000 mg | ORAL_TABLET | Freq: Every day | ORAL | 3 refills | Status: DC

## 2020-06-30 NOTE — Progress Notes (Signed)
Cardiology Office Note   Date:  06/30/2020   ID:  KYLOR VALVERDE, DOB [birth year], MRN [id number]  PCP:  Glean Hess, MD  Cardiologist:   Kathlyn Sacramento, MD   Chief Complaint  Patient presents with  . Follow-up    2 Months follow up and medications verbally reviewed with patient. C/o having low heart rate in the 50s.      History of Present Illness: RANDI COLLEGE is a 69 y.o. male who presents for a follow-up visit regarding coronary artery disease.  He had inferior ST elevation myocardial infarction complicated by ventricular fibrillation arrest in December 2018 which was treated successfully with PCI and 2 overlapped drug-eluting stent placement to the right coronary artery.  He was also found to have significant ostial LAD stenosis, borderline significant proximal LAD stenosis and moderate distal LAD stenosis.  Ejection fraction was mildly reduced.  He had atrial fibrillation after the procedure that was treated successfully with amiodarone. A staged IVUS guided drug-eluting stent placement to the proximal and ostial LAD was done in February 2019.  Repeat cardiac catheterization April 2019 showed widely patent LAD and RCA stent with no significant restenosis.  The ramus branch was pinched by the LAD stent with 70% ostial stenosis.  EF was 50 to 55%.  He has other chronic medical conditions that include previous stroke, hyperlipidemia, previous tobacco use and BPH.  He is known to have peripheral arterial disease being treated medically.  Noninvasive vascular studies in July 2020 showed moderately reduced ABI  in the 0.7 range bilaterally with diffuse moderate SFA disease worse on the left side.  He had repeat ABI done in July of this year which was stable overall. Most recent nuclear stress test in February 2021 showed no evidence of ischemia with normal ejection fraction.  Echocardiogram in March showed an EF of 45 to 50% with mild mitral regurgitation.  He has been doing  reasonably well.  He reports stable exertional dyspnea with no chest pain.  He also has mild bilateral calf claudication.  He went rabbit hunting recently and was able to walk about 4 miles.  He is usually limited by burning sensation in bilateral calfs that happens after walking about half a mile to a mile.  He doesn't have claudication when he is doing his regular everyday activities.  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  He reports increased dizziness.  Past Medical History:  Diagnosis Date  . Acute ST elevation myocardial infarction (STEMI) of inferior wall ([place]) 07/18/2017  . CAD (coronary artery disease)    a. 03/2016 MV: EF 55-65%, low risk; b. 06/2017 Inf STEMI: LM 85d, LAD 60p, RCA 99ost (3.25x18 Xience [place] DES/3.0x38 [place] DES), RPAV 60; c. 08/2017 PCI dLM/LAD (3.0x38 Xience [place] DES); d. 10/2017 Cath: LM/prox LAD patent, LAD 14m, 40d, RI 70, LCX 40p, RCA patent stent. EF 50-55; e. 08/2019 MV: Low risk EF 55-65%.  . Contrast media allergy   . GERD (gastroesophageal reflux disease)   . Hemorrhoid   . Hyperlipidemia   . Hypertension   . Ischemic cardiomyopathy    a. 06/2017 Echo: EF 45-50%, mod inf/infsept HK. Mildly dil LA; b. 09/2019 Echo: EF 45-50%, Gr2 DD. Mild MR.  Marland Kitchen PAD (peripheral artery disease) ([place])    a. 01/2019 ABIs and Duplex: ABI R 0.78, L 0.79; TBI R 0.66, L 1.11. RCIA >50p, RSFA 50-74. LCIA >50p, LSFA 75-99p, 30-49d. 3 vessel runoff bilat.  Marland Kitchen PAF (paroxysmal atrial fibrillation) ([place])  a. Brief episode during December hospitalization-->converted on IV amio-->not on [place].  Marland Kitchen Stroke [facility]) 2011   "2 MINI STROKES". no deficits.  Marland Kitchen Unstable angina [facility])     Past Surgical History:  Procedure Laterality Date  . ANAL FISTULECTOMY N/A 12/21/2014   Procedure: FISTULECTOMY ANAL;  Surgeon: Christene Lye, MD;  Location: [facility] ORS;  Service: General;  Laterality: N/A;  . APPENDECTOMY  1975  . CARDIAC CATHETERIZATION    . COLONOSCOPY WITH PROPOFOL N/A  02/12/2019   Procedure: COLONOSCOPY WITH PROPOFOL;  Surgeon: Lucilla Lame, MD;  Location: [facility] ENDOSCOPY;  Service: Endoscopy;  Laterality: N/A;  . CORONARY STENT INTERVENTION N/A 09/09/2017   Procedure: CORONARY STENT INTERVENTION;  Surgeon: Wellington Hampshire, MD;  Location: [place] CV LAB;  Service: Cardiovascular;  Laterality: N/A;  . CORONARY/GRAFT ACUTE MI REVASCULARIZATION N/A 07/18/2017   Procedure: Coronary/Graft Acute MI Revascularization;  Surgeon: Wellington Hampshire, MD;  Location: [place] CV LAB;  Service: Cardiovascular;  Laterality: N/A;  . HEMORRHOID SURGERY N/A 12/21/2014   Procedure: HEMORRHOIDECTOMY;  Surgeon: Christene Lye, MD;  Location: [facility] ORS;  Service: General;  Laterality: N/A;  . INTRAVASCULAR ULTRASOUND/IVUS N/A 09/09/2017   Procedure: Intravascular Ultrasound/IVUS;  Surgeon: Wellington Hampshire, MD;  Location: [place] CV LAB;  Service: Cardiovascular;  Laterality: N/A;  . KNEE SURGERY Right age 76  . LEFT HEART CATH AND CORONARY ANGIOGRAPHY N/A 07/18/2017   Procedure: LEFT HEART CATH AND CORONARY ANGIOGRAPHY;  Surgeon: Wellington Hampshire, MD;  Location: [place] CV LAB;  Service: Cardiovascular;  Laterality: N/A;  . LEFT HEART CATH AND CORONARY ANGIOGRAPHY Left 08/19/2017   Procedure: LEFT HEART CATH AND CORONARY ANGIOGRAPHY;  Surgeon: Wellington Hampshire, MD;  Location: [place] CV LAB;  Service: Cardiovascular;  Laterality: Left;  . LEFT HEART CATH AND CORONARY ANGIOGRAPHY N/A 11/07/2017   Procedure: LEFT HEART CATH AND CORONARY ANGIOGRAPHY;  Surgeon: Wellington Hampshire, MD;  Location: [place] CV LAB;  Service: Cardiovascular;  Laterality: N/A;  . ROTATOR CUFF REPAIR Right 2013     Current Outpatient Medications  Medication Sig Dispense Refill  . aspirin EC 81 MG tablet Take 81 mg by mouth daily.     Marland Kitchen atorvastatin (LIPITOR) 80 MG tablet TAKE 1 TABLET EVERY DAY AT 6:00PM 90 tablet 3  . clopidogrel (PLAVIX) 75 MG tablet TAKE 1 TABLET  (75 MG TOTAL) BY MOUTH DAILY. 90 tablet 1  . ezetimibe (ZETIA) 10 MG tablet TAKE 1 TABLET EVERY DAY 90 tablet 1  . furosemide (LASIX) 20 MG tablet TAKE 1 TABLET (20 MG TOTAL) BY MOUTH DAILY. 90 tablet 1  . losartan (COZAAR) 100 MG tablet Take 1 tablet (100 mg total) by mouth daily. (Patient taking differently: Take 25 mg by mouth daily.) 90 tablet 3  . nitroGLYCERIN (NITROSTAT) 0.4 MG SL tablet Place 1 tablet (0.4 mg total) under the tongue every 5 (five) minutes as needed for chest pain. 30 tablet 0  . potassium chloride (KLOR-CON) 10 MEQ tablet TAKE 1 TABLET (10 MEQ TOTAL) BY MOUTH DAILY. 90 tablet 3  . tamsulosin (FLOMAX) 0.4 MG CAPS capsule TAKE 1 CAPSULE (0.4 MG TOTAL) BY MOUTH DAILY. 90 capsule 3  . amLODipine (NORVASC) 5 MG tablet Take 1 tablet (5 mg total) by mouth daily. 90 tablet 3   No current facility-administered medications for this visit.    Allergies:   Codeine, Shellfish allergy, Contrast media [iodinated diagnostic agents], and Fish allergy    Social History:  The patient  reports that he quit smoking about 12 years ago. His smoking use included cigarettes. He has never used smokeless tobacco. He reports current alcohol use of about 5.0 - 6.0 standard drinks of alcohol per week. He reports that he does not use drugs.   Family History:  The patient's family history includes Alzheimer's disease in his father and mother; Hypertension in his father and mother.    ROS:  Please see the history of present illness.   Otherwise, review of systems are positive for none.   All other systems are reviewed and negative.    PHYSICAL EXAM: VS:  BP 140/80 (BP Location: Left Arm, Patient Position: Sitting, Cuff Size: Normal)   Pulse (!) 53   Ht 5\' 7"  (1.702 m)   Wt 188 lb (85.3 kg)   SpO2 98%   BMI 29.44 kg/m  , BMI Body mass index is 29.44 kg/m. GEN: Well nourished, well developed, in no acute distress  HEENT: normal  Neck: no JVD, carotid bruits, or masses Cardiac: RRR; no  murmurs, rubs, or gallops,no edema  Respiratory:  clear to auscultation bilaterally, normal work of breathing GI: soft, nontender, nondistended, + BS MS: no deformity or atrophy  Skin: warm and dry, no rash Neuro:  Strength and sensation are intact Psych: euthymic mood, full affect   EKG:  EKG is ordered today. The ekg ordered today demonstrates sinus bradycardia with poor R wave progression.   Recent Labs: 04/11/2020: ALT 29; BUN 14; Creatinine, Ser 1.10; Hemoglobin 14.2; Platelets 306; Potassium 4.5; Sodium 140    Lipid Panel    Component Value Date/Time   CHOL 153 04/11/2020 0914   TRIG 44 04/11/2020 0914   HDL 61 04/11/2020 0914   CHOLHDL 2.5 04/11/2020 0914   CHOLHDL 2.8 11/07/2017 0511   VLDL 4 11/07/2017 0511   LDLCALC 82 04/11/2020 0914   LDLDIRECT 116 (H) 09/24/2017 1338      Wt Readings from Last 3 Encounters:  06/30/20 188 lb (85.3 kg)  04/11/20 186 lb (84.4 kg)  12/31/19 186 lb (84.4 kg)       PAD Screen 03/13/2016  Previous PAD dx? No  Previous surgical procedure? No  Pain with walking? No  Feet/toe relief with dangling? Yes  Painful, non-healing ulcers? No  Extremities discolored? No      ASSESSMENT AND PLAN:  1.  Peripheral arterial disease: Mild bilateral calf claudication.  His symptoms are not lifestyle limiting at the present time.  Recommend continuing medical therapy.  2. Coronary artery disease involving native coronary arteries without angina: He is doing extremely well with no anginal symptoms.  Continue medical therapy.  Continue long-term dual antiplatelet therapy as tolerated.    3.  Chronic systolic/diastolic heart failure: Stress test showed normal EF but echo showed mildly reduced EF.  Continue losartan.  He appears to be euvolemic on small dose Lasix.    4.  Hyperlipidemia: Continue high-dose atorvastatin and Zetia.  Most recent lipid profile showed an LDL of 82.  If LDL remains consistently above 70, we can consider adding a  PCSK9 inhibitor.  5.  Essential hypertension: Blood pressure is mildly elevated.  He has chronic bradycardia and he reports worsening dizziness.  I elected to stop carvedilol and add amlodipine 5 mg daily.   Disposition:   FU with me in 6 months  Signed,  Kathlyn Sacramento, MD  06/30/2020 11:28 AM    [place]

## 2020-06-30 NOTE — Patient Instructions (Signed)
Medication Instructions:  Your physician has recommended you make the following change in your medication:   1) STOP Carvedilol (coreg)  2) START Amlodipine 5 mg daily. An Rx has been sent to your pharmacy.  *If you need a refill on your cardiac medications before your next appointment, please call your pharmacy*   Lab Work: None ordered If you have labs (blood work) drawn today and your tests are completely normal, you will receive your results only by: Marland Kitchen MyChart Message (if you have MyChart) OR . A paper copy in the mail If you have any lab test that is abnormal or we need to change your treatment, we will call you to review the results.   Testing/Procedures: None ordered   Follow-Up: At [facility], you and your health needs are our priority.  As part of our continuing mission to provide you with exceptional heart care, we have created designated Provider Care Teams.  These Care Teams include your primary Cardiologist (physician) and Advanced Practice Providers (APPs -  Physician Assistants and Nurse Practitioners) who all work together to provide you with the care you need, when you need it.  We recommend signing up for the patient portal called "MyChart".  Sign up information is provided on this After Visit Summary.  MyChart is used to connect with patients for Virtual Visits (Telemedicine).  Patients are able to view lab/test results, encounter notes, upcoming appointments, etc.  Non-urgent messages can be sent to your provider as well.   To learn more about what you can do with MyChart, go to [email or web address].    Your next appointment:   6 month(s)  The format for your next appointment:   In Person  Provider:   You may see Kathlyn Sacramento, MD or one of the following Advanced Practice Providers on your designated Care Team:    Murray Hodgkins, NP  [name], PA-C  Marrianne Mood, PA-C  Cadence [place], [place]  Laurann Montana, NP    Other  Instructions N/A

## 2020-09-18 DIAGNOSIS — Z885 Allergy status to narcotic agent status: Secondary | ICD-10-CM | POA: Diagnosis not present

## 2020-09-18 DIAGNOSIS — Z791 Long term (current) use of non-steroidal anti-inflammatories (NSAID): Secondary | ICD-10-CM | POA: Diagnosis not present

## 2020-09-18 DIAGNOSIS — H538 Other visual disturbances: Secondary | ICD-10-CM | POA: Diagnosis not present

## 2020-09-18 DIAGNOSIS — Z87891 Personal history of nicotine dependence: Secondary | ICD-10-CM | POA: Diagnosis not present

## 2020-09-18 DIAGNOSIS — I110 Hypertensive heart disease with heart failure: Secondary | ICD-10-CM | POA: Diagnosis not present

## 2020-09-18 DIAGNOSIS — Z7982 Long term (current) use of aspirin: Secondary | ICD-10-CM | POA: Diagnosis not present

## 2020-09-18 DIAGNOSIS — H5712 Ocular pain, left eye: Secondary | ICD-10-CM | POA: Diagnosis not present

## 2020-09-18 DIAGNOSIS — S0502XA Injury of conjunctiva and corneal abrasion without foreign body, left eye, initial encounter: Secondary | ICD-10-CM | POA: Diagnosis not present

## 2020-09-18 DIAGNOSIS — Z7902 Long term (current) use of antithrombotics/antiplatelets: Secondary | ICD-10-CM | POA: Diagnosis not present

## 2020-09-18 DIAGNOSIS — Z973 Presence of spectacles and contact lenses: Secondary | ICD-10-CM | POA: Diagnosis not present

## 2020-09-18 DIAGNOSIS — I5022 Chronic systolic (congestive) heart failure: Secondary | ICD-10-CM | POA: Diagnosis not present

## 2020-09-26 ENCOUNTER — Other Ambulatory Visit: Payer: Self-pay | Admitting: Internal Medicine

## 2020-09-29 ENCOUNTER — Encounter: Payer: Self-pay | Admitting: Internal Medicine

## 2020-09-29 ENCOUNTER — Ambulatory Visit (INDEPENDENT_AMBULATORY_CARE_PROVIDER_SITE_OTHER): Payer: Medicare HMO | Admitting: Internal Medicine

## 2020-09-29 ENCOUNTER — Other Ambulatory Visit: Payer: Self-pay

## 2020-09-29 VITALS — BP 138/78 | HR 62 | Temp 97.8°F | Ht 67.0 in | Wt 185.0 lb

## 2020-09-29 DIAGNOSIS — D485 Neoplasm of uncertain behavior of skin: Secondary | ICD-10-CM | POA: Diagnosis not present

## 2020-09-29 DIAGNOSIS — I739 Peripheral vascular disease, unspecified: Secondary | ICD-10-CM | POA: Diagnosis not present

## 2020-09-29 DIAGNOSIS — I10 Essential (primary) hypertension: Secondary | ICD-10-CM | POA: Diagnosis not present

## 2020-09-29 DIAGNOSIS — D6859 Other primary thrombophilia: Secondary | ICD-10-CM | POA: Insufficient documentation

## 2020-09-29 DIAGNOSIS — I5022 Chronic systolic (congestive) heart failure: Secondary | ICD-10-CM

## 2020-09-29 DIAGNOSIS — H6123 Impacted cerumen, bilateral: Secondary | ICD-10-CM | POA: Diagnosis not present

## 2020-09-29 DIAGNOSIS — I25118 Atherosclerotic heart disease of native coronary artery with other forms of angina pectoris: Secondary | ICD-10-CM

## 2020-09-29 HISTORY — DX: Chronic systolic (congestive) heart failure: I50.22

## 2020-09-29 HISTORY — DX: Other primary thrombophilia: D68.59

## 2020-09-29 NOTE — Patient Instructions (Addendum)
[facility]  [phone] ?

## 2020-09-29 NOTE — Progress Notes (Signed)
Date:  09/29/2020   Name:  Todd Weaver   DOB:  [birth year]   MRN:  [id number]   Chief Complaint: Hypertension  Hypertension This is a chronic problem. The problem is controlled. Associated symptoms include shortness of breath (on exertion). Pertinent negatives include no chest pain, headaches or palpitations. Past treatments include angiotensin blockers, diuretics and calcium channel blockers. The current treatment provides moderate improvement. There are no compliance problems.    CAD - mild DOE that he does not feel is changing.  He denies irregular heart beat or palpitations.  No outright chest pain.  Seen by cardiology in December with no changes or testing recommended.  Thrombophilia - on Plavix due to stents.  No bleeding issues noted.  Skin lesion - he has a lesion on his right cheek that is getting larger.  He would like it to be removed.  Ear fullness - he has sx on the left and mild decreased hearing on both sides.  He has a hx of cerumen impaction and thinks it has recurred.   Lab Results  Component Value Date   CREATININE 1.10 04/11/2020   BUN 14 04/11/2020   NA 140 04/11/2020   K 4.5 04/11/2020   CL 103 04/11/2020   CO2 21 04/11/2020   Lab Results  Component Value Date   CHOL 153 04/11/2020   HDL 61 04/11/2020   LDLCALC 82 04/11/2020   LDLDIRECT 116 (H) 09/24/2017   TRIG 44 04/11/2020   CHOLHDL 2.5 04/11/2020   Lab Results  Component Value Date   TSH 1.232 07/19/2017   Lab Results  Component Value Date   HGBA1C 5.8 (H) 11/06/2017   Lab Results  Component Value Date   WBC 6.4 04/11/2020   HGB 14.2 04/11/2020   HCT 42.9 04/11/2020   MCV 86 04/11/2020   PLT 306 04/11/2020   Lab Results  Component Value Date   ALT 29 04/11/2020   AST 24 04/11/2020   ALKPHOS 65 04/11/2020   BILITOT 1.3 (H) 04/11/2020     Review of Systems  Constitutional: Negative for fatigue and unexpected weight change.  HENT: Positive for hearing loss. Negative for  nosebleeds and trouble swallowing.   Eyes: Negative for visual disturbance.  Respiratory: Positive for shortness of breath (on exertion). Negative for cough, chest tightness and wheezing.   Cardiovascular: Negative for chest pain, palpitations and leg swelling.  Gastrointestinal: Negative for abdominal pain, blood in stool, constipation and diarrhea.  Genitourinary: Negative for hematuria.  Musculoskeletal: Negative for arthralgias, gait problem and joint swelling.  Skin:       Skin lesion  Neurological: Negative for dizziness, weakness, light-headedness and headaches.  Psychiatric/Behavioral: Negative for dysphoric mood and sleep disturbance. The patient is not nervous/anxious.     Patient Active Problem List   Diagnosis Date Noted  . Chronic systolic congestive heart failure ([place]) 09/29/2020  . Thrombophilia ([place]) 09/29/2020  . PAF (paroxysmal atrial fibrillation) ([place]) 04/11/2020  . PAD (peripheral artery disease) ([place]) 03/31/2019  . Rectal polyp   . Impingement syndrome of left shoulder region 10/14/2018  . Coronary artery disease of native artery of native heart with stable angina pectoris ([place]) 10/03/2018  . History of ST elevation myocardial infarction (STEMI) 07/18/2017  . Essential hypertension 06/19/2017  . Hyperlipidemia 06/19/2017  . History of CVA (cerebrovascular accident) 06/19/2017  . Overweight (BMI 25.0-29.9) 06/19/2017  . BPH (benign prostatic hyperplasia) 06/19/2017  . ED (erectile dysfunction) 06/19/2017  . MCI (mild cognitive impairment) 06/19/2017  .  Lumbar spondylosis with myelopathy 11/08/2016  . Hand dermatitis 07/26/2014    Allergies  Allergen Reactions  . Codeine Hives  . Shellfish Allergy Hives and Swelling  . Contrast Media [Iodinated Diagnostic Agents] Itching and Other (See Comments)    Whelts on tongue  . Fish Allergy Itching    Past Surgical History:  Procedure Laterality Date  . ANAL FISTULECTOMY N/A 12/21/2014   Procedure: FISTULECTOMY  ANAL;  Surgeon: Christene Lye, MD;  Location: [facility] ORS;  Service: General;  Laterality: N/A;  . APPENDECTOMY  1975  . CARDIAC CATHETERIZATION    . COLONOSCOPY WITH PROPOFOL N/A 02/12/2019   Procedure: COLONOSCOPY WITH PROPOFOL;  Surgeon: Lucilla Lame, MD;  Location: [facility] ENDOSCOPY;  Service: Endoscopy;  Laterality: N/A;  . CORONARY STENT INTERVENTION N/A 09/09/2017   Procedure: CORONARY STENT INTERVENTION;  Surgeon: Wellington Hampshire, MD;  Location: [place] CV LAB;  Service: Cardiovascular;  Laterality: N/A;  . CORONARY/GRAFT ACUTE MI REVASCULARIZATION N/A 07/18/2017   Procedure: Coronary/Graft Acute MI Revascularization;  Surgeon: Wellington Hampshire, MD;  Location: [place] CV LAB;  Service: Cardiovascular;  Laterality: N/A;  . HEMORRHOID SURGERY N/A 12/21/2014   Procedure: HEMORRHOIDECTOMY;  Surgeon: Christene Lye, MD;  Location: [facility] ORS;  Service: General;  Laterality: N/A;  . INTRAVASCULAR ULTRASOUND/IVUS N/A 09/09/2017   Procedure: Intravascular Ultrasound/IVUS;  Surgeon: Wellington Hampshire, MD;  Location: [place] CV LAB;  Service: Cardiovascular;  Laterality: N/A;  . KNEE SURGERY Right age 41  . LEFT HEART CATH AND CORONARY ANGIOGRAPHY N/A 07/18/2017   Procedure: LEFT HEART CATH AND CORONARY ANGIOGRAPHY;  Surgeon: Wellington Hampshire, MD;  Location: [place] CV LAB;  Service: Cardiovascular;  Laterality: N/A;  . LEFT HEART CATH AND CORONARY ANGIOGRAPHY Left 08/19/2017   Procedure: LEFT HEART CATH AND CORONARY ANGIOGRAPHY;  Surgeon: Wellington Hampshire, MD;  Location: [place] CV LAB;  Service: Cardiovascular;  Laterality: Left;  . LEFT HEART CATH AND CORONARY ANGIOGRAPHY N/A 11/07/2017   Procedure: LEFT HEART CATH AND CORONARY ANGIOGRAPHY;  Surgeon: Wellington Hampshire, MD;  Location: [place] CV LAB;  Service: Cardiovascular;  Laterality: N/A;  . ROTATOR CUFF REPAIR Right 2013    Social History   Tobacco Use  . Smoking status: Former Smoker     Types: Cigarettes    Quit date: 07/24/2007    Years since quitting: 13.1  . Smokeless tobacco: Never Used  . Tobacco comment: smoking cessation materials not required  Vaping Use  . Vaping Use: Never used  Substance Use Topics  . Alcohol use: Yes    Alcohol/week: 5.0 - 6.0 standard drinks    Types: 5 - 6 Cans of beer per week    Comment:  weekly  . Drug use: No     Medication list has been reviewed and updated.  Current Meds  Medication Sig  . amLODipine (NORVASC) 5 MG tablet Take 1 tablet (5 mg total) by mouth daily.  Marland Kitchen aspirin EC 81 MG tablet Take 81 mg by mouth daily.   Marland Kitchen atorvastatin (LIPITOR) 80 MG tablet TAKE 1 TABLET EVERY DAY AT 6:00PM  . carvedilol (COREG) 3.125 MG tablet   . clopidogrel (PLAVIX) 75 MG tablet TAKE 1 TABLET (75 MG TOTAL) BY MOUTH DAILY.  Marland Kitchen ezetimibe (ZETIA) 10 MG tablet TAKE 1 TABLET EVERY DAY  . furosemide (LASIX) 20 MG tablet TAKE 1 TABLET (20 MG TOTAL) BY MOUTH DAILY.  Marland Kitchen losartan (COZAAR) 100 MG tablet Take 1 tablet (100 mg total) by mouth daily. (  Patient taking differently: Take 25 mg by mouth daily.)  . nitroGLYCERIN (NITROSTAT) 0.4 MG SL tablet Place 1 tablet (0.4 mg total) under the tongue every 5 (five) minutes as needed for chest pain.  . potassium chloride (KLOR-CON) 10 MEQ tablet TAKE 1 TABLET (10 MEQ TOTAL) BY MOUTH DAILY.  . tamsulosin (FLOMAX) 0.4 MG CAPS capsule TAKE 1 CAPSULE (0.4 MG TOTAL) BY MOUTH DAILY.    PHQ 2/9 Scores 09/29/2020 12/11/2019 12/09/2019 07/08/2019  PHQ - 2 Score 0 0 0 0  PHQ- 9 Score 0 0 - -    GAD 7 : Generalized Anxiety Score 09/29/2020 12/11/2019  Nervous, Anxious, on Edge 0 0  Control/stop worrying 0 0  Worry too much - different things 0 0  Trouble relaxing 0 0  Restless 0 0  Easily annoyed or irritable 0 0  Afraid - awful might happen 0 0  Total GAD 7 Score 0 0  Anxiety Difficulty - Not difficult at all    BP Readings from Last 3 Encounters:  09/29/20 138/78  06/30/20 140/80  04/11/20 138/80     Physical Exam Vitals and nursing note reviewed.  Constitutional:      General: He is not in acute distress.    Appearance: Normal appearance. He is well-developed.  HENT:     Head: Normocephalic and atraumatic.     Right Ear: There is impacted cerumen.     Left Ear: There is impacted cerumen.     Ears:     Comments: Cerumen removed with curette from both ears - TMs normal.  Pt tolerated the procedure well with resolution of ear fullness. Cardiovascular:     Rate and Rhythm: Normal rate and regular rhythm.  No extrasystoles are present.    Pulses:          Dorsalis pedis pulses are 1+ on the right side and 1+ on the left side.     Heart sounds: Normal heart sounds. No murmur heard.   Pulmonary:     Effort: Pulmonary effort is normal. No respiratory distress.     Breath sounds: No wheezing or rhonchi.  Musculoskeletal:     Right lower leg: No edema.     Left lower leg: No edema.  Lymphadenopathy:     Cervical: No cervical adenopathy.  Skin:    General: Skin is warm and dry.     Findings: No rash.       Neurological:     Mental Status: He is alert and oriented to person, place, and time.  Psychiatric:        Mood and Affect: Mood normal.        Behavior: Behavior normal.     Wt Readings from Last 3 Encounters:  09/29/20 185 lb (83.9 kg)  06/30/20 188 lb (85.3 kg)  04/11/20 186 lb (84.4 kg)    BP 138/78   Pulse 62   Temp 97.8 F (36.6 C) (Oral)   Ht 5\' 7"  (1.702 m)   Wt 185 lb (83.9 kg)   SpO2 96%   BMI 28.98 kg/m   Assessment and Plan: 1. Essential hypertension Clinically stable exam with well controlled BP. Tolerating medications without side effects at this time. Pt to continue current regimen and low sodium diet; benefits of regular exercise as able discussed.  2. Coronary artery disease of native artery of native heart with stable angina pectoris (HCC) Mild increase in DOE without pain.  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   Recommend he  follow up with Cardiology if worsening.  3. PAD (peripheral artery disease) (HCC) No claudication or ulcerations Pulses 1+  4. Thrombophilia ([place]) On Plavix without bleeding  5. Impacted cerumen of both ears Removed from both ears with curette Pt had improvement in symtpoms  6. Neoplasm of uncertain behavior of skin Recommend Dermatology evaluation   Partially dictated using Dragon software. Any errors are unintentional.  Halina Maidens, MD [facility]  09/29/2020

## 2020-10-17 DIAGNOSIS — L720 Epidermal cyst: Secondary | ICD-10-CM | POA: Diagnosis not present

## 2020-10-17 DIAGNOSIS — D485 Neoplasm of uncertain behavior of skin: Secondary | ICD-10-CM | POA: Diagnosis not present

## 2020-10-17 DIAGNOSIS — L0201 Cutaneous abscess of face: Secondary | ICD-10-CM | POA: Diagnosis not present

## 2020-12-05 ENCOUNTER — Ambulatory Visit: Payer: Self-pay | Admitting: *Deleted

## 2020-12-05 ENCOUNTER — Emergency Department
Admission: EM | Admit: 2020-12-05 | Discharge: 2020-12-05 | Disposition: A | Discharge status: Home / Self Care | Payer: Medicare HMO | Attending: Emergency Medicine | Admitting: Emergency Medicine

## 2020-12-05 ENCOUNTER — Other Ambulatory Visit: Payer: Self-pay

## 2020-12-05 ENCOUNTER — Emergency Department: Payer: Medicare HMO

## 2020-12-05 ENCOUNTER — Telehealth: Payer: Self-pay | Admitting: Cardiovascular Disease

## 2020-12-05 DIAGNOSIS — R0789 Other chest pain: Secondary | ICD-10-CM | POA: Diagnosis not present

## 2020-12-05 DIAGNOSIS — R531 Weakness: Secondary | ICD-10-CM | POA: Diagnosis not present

## 2020-12-05 DIAGNOSIS — R29818 Other symptoms and signs involving the nervous system: Secondary | ICD-10-CM | POA: Diagnosis not present

## 2020-12-05 DIAGNOSIS — I5022 Chronic systolic (congestive) heart failure: Secondary | ICD-10-CM | POA: Diagnosis not present

## 2020-12-05 DIAGNOSIS — Z87891 Personal history of nicotine dependence: Secondary | ICD-10-CM | POA: Insufficient documentation

## 2020-12-05 DIAGNOSIS — Z7982 Long term (current) use of aspirin: Secondary | ICD-10-CM | POA: Diagnosis not present

## 2020-12-05 DIAGNOSIS — Z7902 Long term (current) use of antithrombotics/antiplatelets: Secondary | ICD-10-CM | POA: Diagnosis not present

## 2020-12-05 DIAGNOSIS — R001 Bradycardia, unspecified: Secondary | ICD-10-CM | POA: Insufficient documentation

## 2020-12-05 DIAGNOSIS — I110 Hypertensive heart disease with heart failure: Secondary | ICD-10-CM | POA: Diagnosis not present

## 2020-12-05 DIAGNOSIS — R200 Anesthesia of skin: Secondary | ICD-10-CM | POA: Diagnosis not present

## 2020-12-05 DIAGNOSIS — I25118 Atherosclerotic heart disease of native coronary artery with other forms of angina pectoris: Secondary | ICD-10-CM | POA: Insufficient documentation

## 2020-12-05 DIAGNOSIS — Z79899 Other long term (current) drug therapy: Secondary | ICD-10-CM | POA: Diagnosis not present

## 2020-12-05 DIAGNOSIS — R202 Paresthesia of skin: Secondary | ICD-10-CM | POA: Diagnosis not present

## 2020-12-05 DIAGNOSIS — G9389 Other specified disorders of brain: Secondary | ICD-10-CM | POA: Diagnosis not present

## 2020-12-05 LAB — COMPREHENSIVE METABOLIC PANEL
ALT: 34 U/L (ref 0–44)
AST: 33 U/L (ref 15–41)
Albumin: 4.4 g/dL (ref 3.5–5.0)
Alkaline Phosphatase: 54 U/L (ref 38–126)
Anion gap: 8 (ref 5–15)
BUN: 11 mg/dL (ref 8–23)
CO2: 25 mmol/L (ref 22–32)
Calcium: 9.4 mg/dL (ref 8.9–10.3)
Chloride: 105 mmol/L (ref 98–111)
Creatinine, Ser: 1.01 mg/dL (ref 0.61–1.24)
GFR, Estimated: >60 mL/min (ref >60)
Glucose, Bld: 105 mg/dL — ABNORMAL HIGH (ref 70–99)
Potassium: 4 mmol/L (ref 3.5–5.1)
Sodium: 138 mmol/L (ref 135–145)
Total Bilirubin: 1.5 mg/dL — ABNORMAL HIGH (ref 0.3–1.2)
Total Protein: 7.9 g/dL (ref 6.5–8.1)

## 2020-12-05 LAB — CBC
HCT: 43.7 % (ref 39.0–52.0)
Hemoglobin: 14.9 g/dL (ref 13.0–17.0)
MCH: 27.9 pg (ref 26.0–34.0)
MCHC: 34.1 g/dL (ref 30.0–36.0)
MCV: 81.8 fL (ref 80.0–100.0)
Platelets: 295 10*3/uL (ref 150–400)
RBC: 5.34 MIL/uL (ref 4.22–5.81)
RDW: 14.5 % (ref 11.5–15.5)
WBC: 7.4 10*3/uL (ref 4.0–10.5)
nRBC: 0 % (ref 0.0–0.2)

## 2020-12-05 LAB — PROTIME-INR
INR: 1.1 (ref 0.8–1.2)
Prothrombin Time: 14.1 seconds (ref 11.4–15.2)

## 2020-12-05 LAB — DIFFERENTIAL
Abs Immature Granulocytes: 0.04 10*3/uL (ref 0.00–0.07)
Basophils Absolute: 0.1 10*3/uL (ref 0.0–0.1)
Basophils Relative: 1 %
Eosinophils Absolute: 0.6 10*3/uL — ABNORMAL HIGH (ref 0.0–0.5)
Eosinophils Relative: 8 %
Immature Granulocytes: 1 %
Lymphocytes Relative: 36 %
Lymphs Abs: 2.7 10*3/uL (ref 0.7–4.0)
Monocytes Absolute: 0.8 10*3/uL (ref 0.1–1.0)
Monocytes Relative: 10 %
Neutro Abs: 3.3 10*3/uL (ref 1.7–7.7)
Neutrophils Relative %: 44 %

## 2020-12-05 LAB — TROPONIN I (HIGH SENSITIVITY): Troponin I (High Sensitivity): 4 ng/L (ref <18)

## 2020-12-05 LAB — APTT: aPTT: 26 seconds (ref 24–36)

## 2020-12-05 MED ORDER — SODIUM CHLORIDE 0.9% FLUSH: 3.0000 mL | Freq: Once | INTRAVENOUS | Status: DC

## 2020-12-05 NOTE — ED Notes (Signed)
Pt states that he was awakened Saturday am with left sided chest pain that radiated into his left arm. Pt states that he has also been having some numbness to his left shoulder and parts of his left hand. Pt has equal strengths to bilat hands, facial movements are symmetric. No distress noted, pt reports this happened once before while he was on a fishing trip so he took 4 baby asa and a nitro and states that the pain resolved.

## 2020-12-05 NOTE — Telephone Encounter (Signed)
Pt called with complaints of bilateral arm numbness and pain that started 12/02/20 at 0200 ; the pt says he woke up sweaty, clammy and nausea; the pt says he was mowing on a tractor and he had been turning a lot; he took ASA x 4 and 1 NTG; the same thing occurred on a fishing trip 1 month ago; the pt says he had a MI 3 years ago;  Chest tightness rated 3-4 out of 10; his chest pain lasted about 45 min;   the pt says he had upper abdominal pain and nausea all day on 12/04/20; his abdominal pain was rated 6 out of 10, and 1-2 out of 10 and constant; recommendations made per nurse triage protocol; he verbalized understanding and will go to the ED; the pt is seen by Dr Army Melia, [facility]; will route to office for noritification Reason for Disposition . [1] Chest pain lasts > 5 minutes AND [2] occurred in past 3 days (72 hours) (Exception: feels exactly the same as previously diagnosed heartburn and has accompanying sour taste in mouth)  Answer Assessment - Initial Assessment Questions 1. LOCATION: "Where does it hurt?"       Under left breast 2. RADIATION: "Does the pain go anywhere else?" (e.g., into neck, jaw, arms, back)     bilateral arm pain and numbness 3. ONSET: "When did the chest pain begin?" (Minutes, hours or days)      12/02/20 4. PATTERN "Does the pain come and go, or has it been constant since it started?"  "Does it get worse with exertion?"     constant 5. DURATION: "How long does it last" (e.g., seconds, minutes, hours)    days 6. SEVERITY: "How bad is the pain?"  (e.g., Scale 1-10; mild, moderate, or severe)    - MILD (1-3): doesn't interfere with normal activities     - MODERATE (4-7): interferes with normal activities or awakens from sleep    - SEVERE (8-10): excruciating pain, unable to do any normal activities       3-4 out of 10; lasted 35 minutes 7. CARDIAC RISK FACTORS: "Do you have any history of heart problems or risk factors for heart disease?" (e.g., angina, prior  heart attack; diabetes, high blood pressure, high cholesterol, smoker, or strong family history of heart disease)     HX MI 3 years ago 70. PULMONARY RISK FACTORS: "Do you have any history of lung disease?"  (e.g., blood clots in lung, asthma, emphysema, birth control pills)      9. CAUSE: "What do you think is causing the chest pain?"   Not sure 10. OTHER SYMPTOMS: "Do you have any other symptoms?" (e.g., dizziness, nausea, vomiting, sweating, fever, difficulty breathing, cough)       Abdominal pain, nausea, sweating, clammy 11. PREGNANCY: "Is there any chance you are pregnant?" "When was your last menstrual period?"       n/a  Protocols used: CHEST PAIN-A-AH

## 2020-12-05 NOTE — ED Triage Notes (Signed)
First Nurse Note: Arrives with c/o left side of trunk and left arm numbness since Friday morning.  AAOx3.  Skin warm and dry. NAD

## 2020-12-05 NOTE — Telephone Encounter (Signed)
Pt c/o of Chest Pain: STAT if CP now or developed within 24 hours  1. Are you having CP right now? Yes , 2 on pain scale   2. Are you experiencing any other symptoms (ex. SOB, nausea, vomiting, sweating)? Thursday physical labor , chest pain onset Friday ,   Abdominal  pain Sunday   3. How long have you been experiencing CP? Friday   4. Is your CP continuous or coming and going? intermittent   5. Have you taken Nitroglycerin? Yes with 4 asa on Friday did help with pain.  ?

## 2020-12-05 NOTE — ED Provider Notes (Signed)
[facility] Emergency Department Provider Note  ____________________________________________   Event Date/Time   First MD Initiated Contact with Patient 12/05/20 1257     (approximate)  I have reviewed the triage vital signs and the nursing notes.   HISTORY  Chief Complaint Numbness and Weakness   HPI Todd Weaver is a 70 y.o. male who has medical history of CAD, HTN, HDL, GERD, PAD, PAF not currently anticoagulated and CVA without residual deficits who presents for assessment of seemingly 2 chief complaints.  First patient states he has had some decreased sensation in his right arm over the last 1 to 2 weeks.  States that he has not had any new weakness of the last 2 weeks.  No other acute symptoms until 2 days ago when around 2 AM he woke with decrease in station in his left arm associate with some chest tightness and nausea.  He states he took 324 mg of ASA and nitro and this went away.  He had a similar episode about a month ago with also went away with nitroglycerin.  States that he has not had any subsequent chest pain or left-sided numbness or weakness but still has still decree sensation in his right arm.  He denies any recent headaches, earaches, vision changes, sore throat, cough, shortness of breath, vomiting, diarrhea, dysuria, rash, abdominal pain, back pain or any numbness weakness or tingling in his lower extremities.  Denies illicit drug use tobacco abuse or EtOH abuse.  No acute concerns at this time.  States he is compliant with all his other medications         Past Medical History:  Diagnosis Date  . Acute ST elevation myocardial infarction (STEMI) of inferior wall ([place]) 07/18/2017  . CAD (coronary artery disease)    a. 03/2016 MV: EF 55-65%, low risk; b. 06/2017 Inf STEMI: LM 85d, LAD 60p, RCA 99ost (3.25x18 Xience [place] DES/3.0x38 [place] DES), RPAV 60; c. 08/2017 PCI dLM/LAD (3.0x38 Xience [place] DES); d. 10/2017 Cath: LM/prox LAD patent,  LAD 17m, 40d, RI 70, LCX 40p, RCA patent stent. EF 50-55; e. 08/2019 MV: Low risk EF 55-65%.  . Contrast media allergy   . GERD (gastroesophageal reflux disease)   . Hemorrhoid   . Hyperlipidemia   . Hypertension   . Ischemic cardiomyopathy    a. 06/2017 Echo: EF 45-50%, mod inf/infsept HK. Mildly dil LA; b. 09/2019 Echo: EF 45-50%, Gr2 DD. Mild MR.  Marland Kitchen PAD (peripheral artery disease) ([place])    a. 01/2019 ABIs and Duplex: ABI R 0.78, L 0.79; TBI R 0.66, L 1.11. RCIA >50p, RSFA 50-74. LCIA >50p, LSFA 75-99p, 30-49d. 3 vessel runoff bilat.  Marland Kitchen PAF (paroxysmal atrial fibrillation) ([place])    a. Brief episode during December hospitalization-->converted on IV amio-->not on [place].  Marland Kitchen Stroke [facility]) 2011   "2 MINI STROKES". no deficits.  Marland Kitchen Unstable angina [facility])     Patient Active Problem List   Diagnosis Date Noted  . Chronic systolic congestive heart failure ([place]) 09/29/2020  . Thrombophilia ([place]) 09/29/2020  . PAF (paroxysmal atrial fibrillation) ([place]) 04/11/2020  . PAD (peripheral artery disease) ([place]) 03/31/2019  . Rectal polyp   . Impingement syndrome of left shoulder region 10/14/2018  . Coronary artery disease of native artery of native heart with stable angina pectoris ([place]) 10/03/2018  . History of ST elevation myocardial infarction (STEMI) 07/18/2017  . Essential hypertension 06/19/2017  . Hyperlipidemia 06/19/2017  . History of CVA (cerebrovascular accident) 06/19/2017  . Overweight (  BMI 25.0-29.9) 06/19/2017  . BPH (benign prostatic hyperplasia) 06/19/2017  . ED (erectile dysfunction) 06/19/2017  . MCI (mild cognitive impairment) 06/19/2017  . Lumbar spondylosis with myelopathy 11/08/2016  . Hand dermatitis 07/26/2014    Past Surgical History:  Procedure Laterality Date  . ANAL FISTULECTOMY N/A 12/21/2014   Procedure: FISTULECTOMY ANAL;  Surgeon: Christene Lye, MD;  Location: [facility] ORS;  Service: General;  Laterality: N/A;  . APPENDECTOMY  1975  . CARDIAC CATHETERIZATION     . COLONOSCOPY WITH PROPOFOL N/A 02/12/2019   Procedure: COLONOSCOPY WITH PROPOFOL;  Surgeon: Lucilla Lame, MD;  Location[facility];  Service: Endoscopy;  Laterality: N/A;  . CORONARY STENT INTERVENTION N/A 09/09/2017   Procedure: CORONARY STENT INTERVENTION;  Surgeon: Wellington Hampshire, MD;  Location: [place] CV LAB;  Service: Cardiovascular;  Laterality: N/A;  . CORONARY/GRAFT ACUTE MI REVASCULARIZATION N/A 07/18/2017   Procedure: Coronary/Graft Acute MI Revascularization;  Surgeon: Wellington Hampshire, MD;  Location: [place] CV LAB;  Service: Cardiovascular;  Laterality: N/A;  . HEMORRHOID SURGERY N/A 12/21/2014   Procedure: HEMORRHOIDECTOMY;  Surgeon: Christene Lye, MD;  Location: [facility] ORS;  Service: General;  Laterality: N/A;  . INTRAVASCULAR ULTRASOUND/IVUS N/A 09/09/2017   Procedure: Intravascular Ultrasound/IVUS;  Surgeon: Wellington Hampshire, MD;  Location: [place] CV LAB;  Service: Cardiovascular;  Laterality: N/A;  . KNEE SURGERY Right age 48  . LEFT HEART CATH AND CORONARY ANGIOGRAPHY N/A 07/18/2017   Procedure: LEFT HEART CATH AND CORONARY ANGIOGRAPHY;  Surgeon: Wellington Hampshire, MD;  Location: [place] CV LAB;  Service: Cardiovascular;  Laterality: N/A;  . LEFT HEART CATH AND CORONARY ANGIOGRAPHY Left 08/19/2017   Procedure: LEFT HEART CATH AND CORONARY ANGIOGRAPHY;  Surgeon: Wellington Hampshire, MD;  Location: [place] CV LAB;  Service: Cardiovascular;  Laterality: Left;  . LEFT HEART CATH AND CORONARY ANGIOGRAPHY N/A 11/07/2017   Procedure: LEFT HEART CATH AND CORONARY ANGIOGRAPHY;  Surgeon: Wellington Hampshire, MD;  Location: [place] CV LAB;  Service: Cardiovascular;  Laterality: N/A;  . ROTATOR CUFF REPAIR Right 2013    Prior to Admission medications   Medication Sig Start Date End Date Taking? Authorizing Provider  amLODipine (NORVASC) 5 MG tablet Take 1 tablet (5 mg total) by mouth daily. 06/30/20 09/28/20  Wellington Hampshire, MD  aspirin EC  81 MG tablet Take 81 mg by mouth daily.  05/23/09   [provider]  atorvastatin (LIPITOR) 80 MG tablet TAKE 1 TABLET EVERY DAY AT 6:00PM 04/25/20   Glean Hess, MD  carvedilol (COREG) 3.125 MG tablet  07/26/20   [provider]  clopidogrel (PLAVIX) 75 MG tablet TAKE 1 TABLET (75 MG TOTAL) BY MOUTH DAILY. 09/26/20   Glean Hess, MD  ezetimibe (ZETIA) 10 MG tablet TAKE 1 TABLET EVERY DAY 04/28/20   Glean Hess, MD  furosemide (LASIX) 20 MG tablet TAKE 1 TABLET (20 MG TOTAL) BY MOUTH DAILY. 09/26/20   Glean Hess, MD  losartan (COZAAR) 100 MG tablet Take 1 tablet (100 mg total) by mouth daily. Patient taking differently: Take 25 mg by mouth daily. 10/01/19   Theora Gianotti, NP  nitroGLYCERIN (NITROSTAT) 0.4 MG SL tablet Place 1 tablet (0.4 mg total) under the tongue every 5 (five) minutes as needed for chest pain. 02/25/19   Glean Hess, MD  potassium chloride (KLOR-CON) 10 MEQ tablet TAKE 1 TABLET (10 MEQ TOTAL) BY MOUTH DAILY. 04/25/20   Glean Hess, MD  tamsulosin [facility])  0.4 MG CAPS capsule TAKE 1 CAPSULE (0.4 MG TOTAL) BY MOUTH DAILY. 04/25/20   Glean Hess, MD    Allergies Codeine, Shellfish allergy, Contrast media [iodinated diagnostic agents], and Fish allergy  Family History  Problem Relation Age of Onset  . Hypertension Mother   . Alzheimer's disease Mother   . Hypertension Father   . Alzheimer's disease Father     Social History Social History   Tobacco Use  . Smoking status: Former Smoker    Types: Cigarettes    Quit date: 07/24/2007    Years since quitting: 13.3  . Smokeless tobacco: Never Used  . Tobacco comment: smoking cessation materials not required  Vaping Use  . Vaping Use: Never used  Substance Use Topics  . Alcohol use: Yes    Alcohol/week: 5.0 - 6.0 standard drinks    Types: 5 - 6 Cans of beer per week    Comment:  weekly  . Drug use: No    Review of Systems  Review of Systems   Constitutional: Positive for diaphoresis. Negative for chills and fever.  HENT: Negative for sore throat.   Eyes: Negative for pain.  Respiratory: Negative for cough and stridor.   Cardiovascular: Positive for chest pain.  Gastrointestinal: Positive for nausea. Negative for vomiting.  Genitourinary: Negative for dysuria.  Musculoskeletal: Negative for myalgias.  Skin: Negative for rash.  Neurological: Positive for sensory change and weakness. Negative for seizures, loss of consciousness and headaches.  Psychiatric/Behavioral: Negative for suicidal ideas.  All other systems reviewed and are negative.     ____________________________________________   PHYSICAL EXAM:  VITAL SIGNS: ED Triage Vitals  Enc Vitals Group     BP 12/05/20 1151 (!) 153/78     Pulse Rate 12/05/20 1151 (!) 57     Resp 12/05/20 1151 16     Temp 12/05/20 1151 98.4 F (36.9 C)     Temp Source 12/05/20 1151 Oral     SpO2 12/05/20 1151 98 %     Weight 12/05/20 1152 184 lb (83.5 kg)     Height 12/05/20 1152 5\' 7"  (1.702 m)     Head Circumference --      Peak Flow --      Pain Score 12/05/20 1152 0     Pain Loc --      Pain Edu? --      Excl. in [place]? --    Vitals:   12/05/20 1545 12/05/20 1645  BP:    Pulse: (!) 50   Resp:  11  Temp:    SpO2: 95%    Physical Exam Vitals and nursing note reviewed.  Constitutional:      Appearance: He is well-developed.  HENT:     Head: Normocephalic and atraumatic.     Right Ear: External ear normal.     Left Ear: External ear normal.     Nose: Nose normal.  Eyes:     Conjunctiva/sclera: Conjunctivae normal.  Cardiovascular:     Rate and Rhythm: Regular rhythm. Bradycardia present.     Heart sounds: No murmur heard.   Pulmonary:     Effort: Pulmonary effort is normal. No respiratory distress.     Breath sounds: Normal breath sounds.  Abdominal:     Palpations: Abdomen is soft.     Tenderness: There is no abdominal tenderness.  Musculoskeletal:      Cervical back: Neck supple.  Skin:    General: Skin is warm and dry.     Capillary  Refill: Capillary refill takes less than 2 seconds.  Neurological:     Mental Status: He is alert and oriented to person, place, and time.  Psychiatric:        Mood and Affect: Mood normal.     Pronator drift.  No finger dysmetria.  Patient has full strength throughout his bilateral upper and lower extremities.  He reports subjectively decreased sensation in his right upper extremity throughout the distributions of the radial ulnar and median nerves compared to the left.  2+ radial pulses.  He has full sensation throughout the bilateral lower extremities and all sides of the face. ____________________________________________   LABS (all labs ordered are listed, but only abnormal results are displayed)  Labs Reviewed  DIFFERENTIAL - Abnormal; Notable for the following components:      Result Value   Eosinophils Absolute 0.6 (*)    All other components within normal limits  COMPREHENSIVE METABOLIC PANEL - Abnormal; Notable for the following components:   Glucose, Bld 105 (*)    Total Bilirubin 1.5 (*)    All other components within normal limits  PROTIME-INR  APTT  CBC  CBG MONITORING, ED  TROPONIN I (HIGH SENSITIVITY)  TROPONIN I (HIGH SENSITIVITY)   ____________________________________________  EKG  Sinus bradycardia with ventricular rate 57, normal axis, unremarkable intervals without evidence of acute ischemia or other significant underlying arrhythmia. ____________________________________________  RADIOLOGY  ED MD interpretation: No evidence of acute intracranial abnormality on CT head.  There are some encephalomalacia of the posterior right frontal lobe.  Official radiology report(s): DG Chest 2 View  Result Date: 12/05/2020 CLINICAL DATA:  Left-sided numbness. EXAM: CHEST - 2 VIEW COMPARISON:  08/14/2019 FINDINGS: The cardiac silhouette, mediastinal and hilar contours are within  normal limits and stable. The lungs are clear. No pleural effusions. No pulmonary lesions. The bony thorax is intact. IMPRESSION: No acute cardiopulmonary findings. Electronically Signed   By: Marijo Sanes M.D.   On: 12/05/2020 13:57   CT HEAD WO CONTRAST  Result Date: 12/05/2020 CLINICAL DATA:  Neuro deficit, acute stroke suspected EXAM: CT HEAD WITHOUT CONTRAST TECHNIQUE: Contiguous axial images were obtained from the base of the skull through the vertex without intravenous contrast. COMPARISON:  MR brain, 09/26/2005, CT brain, 09/25/2005 FINDINGS: Brain: No evidence of acute infarction, hemorrhage, hydrocephalus, extra-axial collection or mass lesion/mass effect. Encephalomalacia of the posterior right frontal lobe in keeping with prior infarction seen acutely on MR dated 09/26/2005 (series 3, image 20). Vascular: No hyperdense vessel or unexpected calcification. Skull: Normal. Negative for fracture or focal lesion. Sinuses/Orbits: No acute finding. Other: None. IMPRESSION: 1. No acute intracranial pathology. 2. Encephalomalacia of the posterior right frontal lobe in keeping with remote prior infarction seen acutely on MR dated 09/26/2005. Electronically Signed   By: Eddie Candle M.D.   On: 12/05/2020 12:32   MR BRAIN WO CONTRAST  Result Date: 12/05/2020 CLINICAL DATA:  Neuro deficit, acute, stroke suspected. EXAM: MRI HEAD WITHOUT CONTRAST TECHNIQUE: Multiplanar, multiecho pulse sequences of the brain and surrounding structures were obtained without intravenous contrast. COMPARISON:  Head CT Dec 05, 2020. FINDINGS: Brain: No acute infarction, hemorrhage, hydrocephalus, extra-axial collection or mass lesion. Area of encephalomalacia and gliosis involving the right postcentral gyrus. Mild patchy T2 hyperintensity in the periventricular white matter, nonspecific, most likely related to mild chronic microvascular ischemic changes. Vascular: Normal flow voids. Skull and upper cervical spine: Normal marrow  signal. Sinuses/Orbits: Mucous retention cysts in the bilateral maxillary sinuses and left frontal sinus. The the orbits are  maintained. Other: None. IMPRESSION: 1. No acute intracranial abnormality. 2. Remote infarct involving the right postcentral gyrus. 3. Mild chronic microvascular ischemic changes of the white matter. Electronically Signed   By: Pedro Earls M.D.   On: 12/05/2020 16:40    ____________________________________________   PROCEDURES  Procedure(s) performed (including Critical Care):  .1-3 Lead EKG Interpretation Performed by: Lucrezia Starch, MD Authorized by: Lucrezia Starch, MD     Interpretation: normal     ECG rate assessment: bradycardic     Rhythm: sinus bradycardia     Ectopy: none       ____________________________________________   INITIAL IMPRESSION / ASSESSMENT AND PLAN / ED COURSE     Patient presents with above to history exam for assessment of approximately 1 to 2 weeks of right upper extremity decree sensation as well as an episode of chest tightness associated left arm numbness and nausea only lasted a few minutes 2 nights ago and resolved after he took aspirin and nitro.  With regard to his right upper extremity decreased sensation differential includes possible subacute CVA versus neuropathy.  No history of recent trauma.  No significant edema to suggest DVT at this time.  He is otherwise vascularly intact I have low suspicion for acute arterial injury.  CT head shows no clear acute intracranial but evidence of remote infarct.  Given some persistent paresthesias MRI was also obtained that did not show any evidence of acute or subacute CVA or other clear acute intracranial process.  Suspect possible peripheral neuropathy.  However given stable vitals with reassuring exam I think he is safe for discharge with getting outpatient evaluation with regard to the symptoms.  With regard to an episode 2 days ago of left-sided chest  tightness associate with some nausea and decree sensation in his left arm certainly possible this represents an episode of ACS versus arrhythmia.  Given it seems to have resolved I have a low suspicion for ongoing acute symptomatic anemia ACS.  Chest x-ray shows no clear acute thoracic abnormality.  CMP shows no significant electrolyte or metabolic derangements.  CBC shows no leukocytosis or acute anemia.  Given he denies any chest pain shortness of breath or nausea in the last 24 hours suspicion for other Mi life-threatening process at this time I think is safe for discharge with plan for continued outpatient follow-up and evaluation.  Discharged stable condition.  Strict return precautions advised and discussed.  .       ____________________________________________   FINAL CLINICAL IMPRESSION(S) / ED DIAGNOSES  Final diagnoses:  Arm paresthesia, right  Chest tightness    Medications  sodium chloride flush (NS) 0.9 % injection 3 mL (has no administration in time range)     ED Discharge Orders    None       Note:  This document was prepared using Dragon voice recognition software and may include unintentional dictation errors.   Lucrezia Starch, MD 12/05/20 [id number]

## 2020-12-05 NOTE — Telephone Encounter (Signed)
Spoke with the patient. Patient sts that on Thurs 12/01/20 he did a bunch of physical labor. Starting Fri night/Sat morning he was awaken out of his sleep by left sided chest pain 6 out of 10. The pain radiated down his left arm. He reports associated nausea and diaphoresis. He chewed 4 asa 81 mg tablet and took one Nitro and the pain improved.   Patient has continued to and currently has chest pain of a 2 on a scale of 0-10. Adv the patient that based on his cardiac hx and recent/current symptoms I recommend that he been seen in the ED asap for evaluation. Patient is agreeable and will head to [facility] ED asap. Adv the patient that I will fwd the update to Dr. Fletcher Anon.

## 2020-12-05 NOTE — ED Notes (Signed)
Pt on the phone with mri to answer questions

## 2020-12-05 NOTE — ED Notes (Signed)
Pt is back from mri, wife is at bedside

## 2020-12-05 NOTE — ED Triage Notes (Signed)
Pt c/o waking up Saturday around 5am with numbness on the whole left side of his body, states he had nausea and broke out into a sweat, denies any difficulty swallowing or eating, states he does seem to drop things when holding it with the left hand, denies any difficulty walking. Pt is a/ox4 at present.

## 2020-12-12 ENCOUNTER — Other Ambulatory Visit: Payer: Self-pay

## 2020-12-12 ENCOUNTER — Ambulatory Visit (INDEPENDENT_AMBULATORY_CARE_PROVIDER_SITE_OTHER): Payer: Medicare HMO

## 2020-12-12 VITALS — BP 122/80 | HR 64 | Temp 98.5°F | Resp 16 | Ht 67.0 in | Wt 184.8 lb

## 2020-12-12 DIAGNOSIS — Z0000 Encounter for general adult medical examination without abnormal findings: Secondary | ICD-10-CM | POA: Diagnosis not present

## 2020-12-12 NOTE — Progress Notes (Signed)
Subjective:   Todd Weaver is a 70 y.o. male who presents for Medicare Annual/Subsequent preventive examination.  Review of Systems     Cardiac Risk Factors include: advanced age (>48men, >52 women);hypertension;dyslipidemia;male gender     Objective:    Today's Vitals   12/12/20 1333  BP: 122/80  Pulse: 64  Resp: 16  Temp: 98.5 F (36.9 C)  TempSrc: Oral  SpO2: 98%  Weight: 184 lb 12.8 oz (83.8 kg)  Height: 5\' 7"  (1.702 m)   Body mass index is 28.94 kg/m.  Advanced Directives 12/12/2020 12/05/2020 12/13/2019 12/09/2019 08/14/2019 02/12/2019 12/03/2018  Does Patient Have a Medical Advance Directive? No No No No No No No  Would patient like information on creating a medical advance directive? Yes (MAU/Ambulatory/Procedural Areas - Information given) No - Patient declined - Yes (MAU/Ambulatory/Procedural Areas - Information given) - No - Patient declined Yes (MAU/Ambulatory/Procedural Areas - Information given)    Current Medications (verified) Outpatient Encounter Medications as of 12/12/2020  Medication Sig  . aspirin EC 81 MG tablet Take 81 mg by mouth daily.   Marland Kitchen atorvastatin (LIPITOR) 80 MG tablet TAKE 1 TABLET EVERY DAY AT 6:00PM  . clopidogrel (PLAVIX) 75 MG tablet TAKE 1 TABLET (75 MG TOTAL) BY MOUTH DAILY.  Marland Kitchen ezetimibe (ZETIA) 10 MG tablet TAKE 1 TABLET EVERY DAY  . furosemide (LASIX) 20 MG tablet TAKE 1 TABLET (20 MG TOTAL) BY MOUTH DAILY.  Marland Kitchen potassium chloride (KLOR-CON) 10 MEQ tablet TAKE 1 TABLET (10 MEQ TOTAL) BY MOUTH DAILY.  . tamsulosin (FLOMAX) 0.4 MG CAPS capsule TAKE 1 CAPSULE (0.4 MG TOTAL) BY MOUTH DAILY.  . [DISCONTINUED] carvedilol (COREG) 3.125 MG tablet   . amLODipine (NORVASC) 5 MG tablet Take 1 tablet (5 mg total) by mouth daily.  Marland Kitchen losartan (COZAAR) 100 MG tablet Take 1 tablet (100 mg total) by mouth daily. (Patient taking differently: Take 25 mg by mouth daily.)  . nitroGLYCERIN (NITROSTAT) 0.4 MG SL tablet Place 1 tablet (0.4 mg total) under the  tongue every 5 (five) minutes as needed for chest pain.   No facility-administered encounter medications on file as of 12/12/2020.    Allergies (verified) Codeine, Shellfish allergy, Contrast media [iodinated diagnostic agents], and Fish allergy   History: Past Medical History:  Diagnosis Date  . Acute ST elevation myocardial infarction (STEMI) of inferior wall ([place]) 07/18/2017  . CAD (coronary artery disease)    a. 03/2016 MV: EF 55-65%, low risk; b. 06/2017 Inf STEMI: LM 85d, LAD 60p, RCA 99ost (3.25x18 Xience [place] DES/3.0x38 [place] DES), RPAV 60; c. 08/2017 PCI dLM/LAD (3.0x38 Xience [place] DES); d. 10/2017 Cath: LM/prox LAD patent, LAD 25m, 40d, RI 70, LCX 40p, RCA patent stent. EF 50-55; e. 08/2019 MV: Low risk EF 55-65%.  . Contrast media allergy   . GERD (gastroesophageal reflux disease)   . Hemorrhoid   . Hyperlipidemia   . Hypertension   . Ischemic cardiomyopathy    a. 06/2017 Echo: EF 45-50%, mod inf/infsept HK. Mildly dil LA; b. 09/2019 Echo: EF 45-50%, Gr2 DD. Mild MR.  Marland Kitchen PAD (peripheral artery disease) ([place])    a. 01/2019 ABIs and Duplex: ABI R 0.78, L 0.79; TBI R 0.66, L 1.11. RCIA >50p, RSFA 50-74. LCIA >50p, LSFA 75-99p, 30-49d. 3 vessel runoff bilat.  Marland Kitchen PAF (paroxysmal atrial fibrillation) ([place])    a. Brief episode during December hospitalization-->converted on IV amio-->not on [place].  Marland Kitchen Stroke [facility]) 2011   "2 MINI STROKES". no deficits.  Marland Kitchen Unstable angina (H[facility])  Past Surgical History:  Procedure Laterality Date  . ANAL FISTULECTOMY N/A 12/21/2014   Procedure: FISTULECTOMY ANAL;  Surgeon: Christene Lye, MD;  Location: [facility] ORS;  Service: General;  Laterality: N/A;  . APPENDECTOMY  1975  . CARDIAC CATHETERIZATION    . COLONOSCOPY WITH PROPOFOL N/A 02/12/2019   Procedure: COLONOSCOPY WITH PROPOFOL;  Surgeon: Lucilla Lame, MD;  Location: [facility]COPY;  Service: Endoscopy;  Laterality: N/A;  . CORONARY STENT INTERVENTION N/A 09/09/2017   Procedure: CORONARY STENT  INTERVENTION;  Surgeon: Wellington Hampshire, MD;  Location: [place] CV LAB;  Service: Cardiovascular;  Laterality: N/A;  . CORONARY/GRAFT ACUTE MI REVASCULARIZATION N/A 07/18/2017   Procedure: Coronary/Graft Acute MI Revascularization;  Surgeon: Wellington Hampshire, MD;  Location: [place] CV LAB;  Service: Cardiovascular;  Laterality: N/A;  . HEMORRHOID SURGERY N/A 12/21/2014   Procedure: HEMORRHOIDECTOMY;  Surgeon: Christene Lye, MD;  Location: [facility] ORS;  Service: General;  Laterality: N/A;  . INTRAVASCULAR ULTRASOUND/IVUS N/A 09/09/2017   Procedure: Intravascular Ultrasound/IVUS;  Surgeon: Wellington Hampshire, MD;  Location: [place] CV LAB;  Service: Cardiovascular;  Laterality: N/A;  . KNEE SURGERY Right age 24  . LEFT HEART CATH AND CORONARY ANGIOGRAPHY N/A 07/18/2017   Procedure: LEFT HEART CATH AND CORONARY ANGIOGRAPHY;  Surgeon: Wellington Hampshire, MD;  Location: [place] CV LAB;  Service: Cardiovascular;  Laterality: N/A;  . LEFT HEART CATH AND CORONARY ANGIOGRAPHY Left 08/19/2017   Procedure: LEFT HEART CATH AND CORONARY ANGIOGRAPHY;  Surgeon: Wellington Hampshire, MD;  Location: [place] CV LAB;  Service: Cardiovascular;  Laterality: Left;  . LEFT HEART CATH AND CORONARY ANGIOGRAPHY N/A 11/07/2017   Procedure: LEFT HEART CATH AND CORONARY ANGIOGRAPHY;  Surgeon: Wellington Hampshire, MD;  Location: [place] [facility] LAB;  Service: Cardiovascular;  Laterality: N/A;  . ROTATOR CUFF REPAIR Right 2013   Family History  Problem Relation Age of Onset  . Hypertension Mother   . Alzheimer's disease Mother   . Hypertension Father   . Alzheimer's disease Father    Social History   Socioeconomic History  . Marital status: Married    Spouse name: Not on file  . Number of children: 2  . Years of education: Not on file  . Highest education level: 11th grade  Occupational History  . Occupation: Retired  Tobacco Use  . Smoking status: Former Smoker    Types: Cigarettes     Quit date: 07/24/2007    Years since quitting: 13.3  . Smokeless tobacco: Never Used  . Tobacco comment: smoking cessation materials not required  Vaping Use  . Vaping Use: Never used  Substance and Sexual Activity  . Alcohol use: Yes    Alcohol/week: 5.0 - 6.0 standard drinks    Types: 5 - 6 Cans of beer per week    Comment:  weekly  . Drug use: No  . Sexual activity: Not Currently  Other Topics Concern  . Not on file  Social History Narrative   Lives in [place]. Retired.  Completed cardiac rehab program.   Social Determinants of Health   Financial Resource Strain: Low Risk   . Difficulty of Paying Living Expenses: Not hard at all  Food Insecurity: No Food Insecurity  . Worried About Charity fundraiser in the Last Year: Never true  . Ran Out of Food in the Last Year: Never true  Transportation Needs: No Transportation Needs  . Lack of Transportation (Medical): No  . Lack of Transportation (Non-Medical): No  Physical Activity: Sufficiently Active  . Days of Exercise per Week: 3 days  . Minutes of Exercise per Session: 60 min  Stress: No Stress Concern Present  . Feeling of Stress : Only a little  Social Connections: Moderately Integrated  . Frequency of Communication with Friends and Family: More than three times a week  . Frequency of Social Gatherings with Friends and Family: Three times a week  . Attends Religious Services: More than 4 times per year  . Active Member of Clubs or Organizations: No  . Attends Archivist Meetings: Never  . Marital Status: Married    Tobacco Counseling Counseling given: Not Answered Comment: smoking cessation materials not required   Clinical Intake:  Pre-visit preparation completed: Yes  Pain : No/denies pain     BMI - recorded: 28.94 Nutritional Status: BMI 25 -29 Overweight Nutritional Risks: None Diabetes: No  How often do you need to have someone help you when you read instructions, pamphlets, or other  written materials from your doctor or pharmacy?: 1 - Never    Interpreter Needed?: No  Information entered by :: Clemetine Marker LPN   Activities of Daily Living In your present state of health, do you have any difficulty performing the following activities: 12/12/2020  Hearing? N  Comment declines hearing aids  Vision? N  Difficulty concentrating or making decisions? N  Walking or climbing stairs? N  Dressing or bathing? N  Doing errands, shopping? N  Preparing Food and eating ? N  Using the Toilet? N  In the past six months, have you accidently leaked urine? N  Do you have problems with loss of bowel control? N  Managing your Medications? N  Managing your Finances? N  Housekeeping or managing your Housekeeping? N  Some recent data might be hidden    Patient Care Team: Glean Hess, MD as PCP - General (Internal Medicine) Wellington Hampshire, MD as Consulting Physician (Cardiology)  Indicate any recent Medical Services you may have received from other than [facility] providers in the past year (date may be approximate).     Assessment:   This is a routine wellness examination for [place].  Hearing/Vision screen  Hearing Screening   125Hz  250Hz  500Hz  1000Hz  2000Hz  3000Hz  4000Hz  6000Hz  8000Hz   Right ear:           Left ear:           Comments: Pt denies hearing difficulty   Vision Screening Comments: [facility] screenings done [facility]  Dietary issues and exercise activities discussed: Current Exercise Habits: The patient has a physically strenuous job, but has no regular exercise apart from work., Exercise limited by: None identified  Goals Addressed   None    Depression Screen PHQ 2/9 Scores 12/12/2020 09/29/2020 12/11/2019 12/09/2019 07/08/2019 03/31/2019 12/03/2018  PHQ - 2 Score 0 0 0 0 0 0 0  PHQ- 9 Score 6 0 0 - - - -    Fall Risk Fall Risk  12/12/2020 09/29/2020 12/11/2019 12/09/2019 03/31/2019  Falls in the past year? 0 0 0 0 0  Number falls in past  yr: 0 - 0 0 0  Injury with Fall? 0 - 0 0 0  Risk for fall due to : No Fall Risks - No Fall Risks No Fall Risks -  Follow up Falls prevention discussed Falls evaluation completed Falls evaluation completed Falls prevention discussed Falls evaluation completed    [place]:  Any stairs  in or around the home? Yes  If so, are there any without handrails? No  Home free of loose throw rugs in walkways, pet beds, electrical cords, etc? Yes  Adequate lighting in your home to reduce risk of falls? Yes   ASSISTIVE DEVICES UTILIZED TO PREVENT FALLS:  Life alert? No  Use of a cane, walker or w/c? No  Grab bars in the bathroom? Yes  Shower chair or bench in shower? No  Elevated toilet seat or a handicapped toilet? No   TIMED UP AND GO:  Was the test performed? Yes .  Length of time to ambulate 10 feet: 5 sec.   Gait steady and fast without use of assistive device  Cognitive Function: Normal cognitive status assessed by direct observation by this Nurse Health Advisor. No abnormalities found.       6CIT Screen 12/09/2019 12/03/2018  What Year? 0 points 0 points  What month? 0 points 0 points  What time? 0 points 0 points  Count back from 20 0 points 0 points  Months in reverse 0 points 0 points  Repeat phrase 2 points 4 points  Total Score 2 4    Immunizations Immunization History  Administered Date(s) Administered  . Fluad Quad(high Dose 65+) 03/31/2019, 04/11/2020  . Influenza,inj,Quad PF,6+ Mos 06/19/2017, 03/28/2018  . PFIZER(Purple Top)SARS-COV-2 Vaccination 10/01/2019, 10/27/2019  . Pneumococcal Conjugate-13 03/28/2018  . Pneumococcal Polysaccharide-23 12/09/2019  . Tdap 06/19/2017    TDAP status: Up to date  Flu Vaccine status: Up to date  Pneumococcal vaccine status: Up to date  Covid-19 vaccine status: Completed vaccines  Qualifies for Shingles Vaccine? Yes   Zostavax completed No   Shingrix Completed?: No.    Education has been  provided regarding the importance of this vaccine. Patient has been advised to call insurance company to determine out of pocket expense if they have not yet received this vaccine. Advised may also receive vaccine at local pharmacy or Health Dept. Verbalized acceptance and understanding.  Screening Tests Health Maintenance  Topic Date Due  . COVID-19 Vaccine (3 - Pfizer risk 4-dose series) 11/24/2019  . INFLUENZA VACCINE  02/20/2021  . TETANUS/TDAP  06/20/2027  . COLONOSCOPY (Pts 45-59yrs Insurance coverage will need to be confirmed)  02/11/2029  . Hepatitis C Screening  Completed  . PNA vac Low Risk Adult  Completed  . HPV VACCINES  Aged Out    Health Maintenance  Health Maintenance Due  Topic Date Due  . COVID-19 Vaccine (3 - Pfizer risk 4-dose series) 11/24/2019    Colorectal cancer screening: Type of screening: Colonoscopy. Completed 02/12/19. Repeat every 10 years  Lung Cancer Screening: (Low Dose CT Chest recommended if Age 35-80 years, 30 pack-year currently smoking OR have quit w/in 15years.) does not qualify.    Additional Screening:  Hepatitis C Screening: does qualify; Completed 03/28/18  Vision Screening: Recommended annual ophthalmology exams for early detection of glaucoma and other disorders of the eye. Is the patient up to date with their annual eye exam?  Yes  Who is the provider or what is the name of the office in which the patient attends annual eye exams? [place] Screening: Recommended annual dental exams for proper oral hygiene  Community Resource Referral / Chronic Care Management: CRR required this visit?  No   CCM required this visit?  No      Plan:     I have personally reviewed and noted the following in the patient's chart:   . Medical  and social history . Use of alcohol, tobacco or illicit drugs  . Current medications and supplements including opioid prescriptions. Patient is not currently taking opioid  prescriptions. . Functional ability and status . Nutritional status . Physical activity . Advanced directives . List of other physicians . Hospitalizations, surgeries, and ER visits in previous 12 months . Vitals . Screenings to include cognitive, depression, and falls . Referrals and appointments  In addition, I have reviewed and discussed with patient certain preventive protocols, quality metrics, and best practice recommendations. A written personalized care plan for preventive services as well as general preventive health recommendations were provided to patient.     Clemetine Marker, LPN   [id number]   Nurse Notes: pt seen in ED on 12/05/20 for pain in right arm and chest tightness. Pt c/o intermittent shooting, sharp pains going up right arm. Pt seen by Dr. Prescott Parma for right middle trigger finger in 05/2020. Pt was advised to follow up with PCP. Pt to schedule appt at checkout.   Pt also c/o restlessness at night and difficulty falling asleep with episodes of waking up at night with hot flashes, cold sweat and nausea occurring 2-3 times per week on average. Pt to follow up with PCP.   Pt schduled for follow up with Dr. Fletcher Anon 01/05/21.

## 2020-12-12 NOTE — Patient Instructions (Signed)
Todd Weaver , Thank you for taking time to come for your Medicare Wellness Visit. I appreciate your ongoing commitment to your health goals. Please review the following plan we discussed and let me know if I can assist you in the future.   Screening recommendations/referrals: Colonoscopy: done 02/12/19. Repeat in 2030 Recommended yearly ophthalmology/optometry visit for glaucoma screening and checkup Recommended yearly dental visit for hygiene and checkup  Vaccinations: Influenza vaccine: done 04/11/20 Pneumococcal vaccine: done 12/09/19 Tdap vaccine: done 06/19/17 Shingles vaccine: Shingrix discussed. Please contact your pharmacy for coverage information.  Covid-19:  Done 10/01/19 & 10/27/19  Advanced directives: Advance directive discussed with you today. I have provided a copy for you to complete at home and have notarized. Once this is complete please bring a copy in to our office so we can scan it into your chart.  Conditions/risks identified: Recommend drinking 6-8 glasses of water per day.   Next appointment: Follow up in one year for your annual wellness visit.   Preventive Care 68 Years and Older, Male Preventive care refers to lifestyle choices and visits with your health care provider that can promote health and wellness. What does preventive care include?  A yearly physical exam. This is also called an annual well check.  Dental exams once or twice a year.  Routine eye exams. Ask your health care provider how often you should have your eyes checked.  Personal lifestyle choices, including:  Daily care of your teeth and gums.  Regular physical activity.  Eating a healthy diet.  Avoiding tobacco and drug use.  Limiting alcohol use.  Practicing safe sex.  Taking low doses of aspirin every day.  Taking vitamin and mineral supplements as recommended by your health care provider. What happens during an annual well check? The services and screenings done by your health  care provider during your annual well check will depend on your age, overall health, lifestyle risk factors, and family history of disease. Counseling  Your health care provider may ask you questions about your:  Alcohol use.  Tobacco use.  Drug use.  Emotional well-being.  Home and relationship well-being.  Sexual activity.  Eating habits.  History of falls.  Memory and ability to understand (cognition).  Work and work Statistician. Screening  You may have the following tests or measurements:  Height, weight, and BMI.  Blood pressure.  Lipid and cholesterol levels. These may be checked every 5 years, or more frequently if you are over 52 years old.  Skin check.  Lung cancer screening. You may have this screening every year starting at age 31 if you have a 30-pack-year history of smoking and currently smoke or have quit within the past 15 years.  Fecal occult blood test (FOBT) of the stool. You may have this test every year starting at age 49.  Flexible sigmoidoscopy or colonoscopy. You may have a sigmoidoscopy every 5 years or a colonoscopy every 10 years starting at age 23.  Prostate cancer screening. Recommendations will vary depending on your family history and other risks.  Hepatitis C blood test.  Hepatitis B blood test.  Sexually transmitted disease (STD) testing.  Diabetes screening. This is done by checking your blood sugar (glucose) after you have not eaten for a while (fasting). You may have this done every 1-3 years.  Abdominal aortic aneurysm (AAA) screening. You may need this if you are a current or former smoker.  Osteoporosis. You may be screened starting at age 33 if you are at high  risk. Talk with your health care provider about your test results, treatment options, and if necessary, the need for more tests. Vaccines  Your health care provider may recommend certain vaccines, such as:  Influenza vaccine. This is recommended every  year.  Tetanus, diphtheria, and acellular pertussis (Tdap, Td) vaccine. You may need a Td booster every 10 years.  Zoster vaccine. You may need this after age 52.  Pneumococcal 13-valent conjugate (PCV13) vaccine. One dose is recommended after age 26.  Pneumococcal polysaccharide (PPSV23) vaccine. One dose is recommended after age 89. Talk to your health care provider about which screenings and vaccines you need and how often you need them. This information is not intended to replace advice given to you by your health care provider. Make sure you discuss any questions you have with your health care provider. Document Released: 08/05/2015 Document Revised: 03/28/2016 Document Reviewed: 05/10/2015 Elsevier Interactive Patient Education  2017 [place] Prevention in the Home Falls can cause injuries. They can happen to people of all ages. There are many things you can do to make your home safe and to help prevent falls. What can I do on the outside of my home?  Regularly fix the edges of walkways and driveways and fix any cracks.  Remove anything that might make you trip as you walk through a door, such as a raised step or threshold.  Trim any bushes or trees on the path to your home.  Use bright outdoor lighting.  Clear any walking paths of anything that might make someone trip, such as rocks or tools.  Regularly check to see if handrails are loose or broken. Make sure that both sides of any steps have handrails.  Any raised decks and porches should have guardrails on the edges.  Have any leaves, snow, or ice cleared regularly.  Use sand or salt on walking paths during winter.  Clean up any spills in your garage right away. This includes oil or grease spills. What can I do in the bathroom?  Use night lights.  Install grab bars by the toilet and in the tub and shower. Do not use towel bars as grab bars.  Use non-skid mats or decals in the tub or shower.  If you  need to sit down in the shower, use a plastic, non-slip stool.  Keep the floor dry. Clean up any water that spills on the floor as soon as it happens.  Remove soap buildup in the tub or shower regularly.  Attach bath mats securely with double-sided non-slip rug tape.  Do not have throw rugs and other things on the floor that can make you trip. What can I do in the bedroom?  Use night lights.  Make sure that you have a light by your bed that is easy to reach.  Do not use any sheets or blankets that are too big for your bed. They should not hang down onto the floor.  Have a firm chair that has side arms. You can use this for support while you get dressed.  Do not have throw rugs and other things on the floor that can make you trip. What can I do in the kitchen?  Clean up any spills right away.  Avoid walking on wet floors.  Keep items that you use a lot in easy-to-reach places.  If you need to reach something above you, use a strong step stool that has a grab bar.  Keep electrical cords out of the way.  Do not use floor polish or wax that makes floors slippery. If you must use wax, use non-skid floor wax.  Do not have throw rugs and other things on the floor that can make you trip. What can I do with my stairs?  Do not leave any items on the stairs.  Make sure that there are handrails on both sides of the stairs and use them. Fix handrails that are broken or loose. Make sure that handrails are as long as the stairways.  Check any carpeting to make sure that it is firmly attached to the stairs. Fix any carpet that is loose or worn.  Avoid having throw rugs at the top or bottom of the stairs. If you do have throw rugs, attach them to the floor with carpet tape.  Make sure that you have a light switch at the top of the stairs and the bottom of the stairs. If you do not have them, ask someone to add them for you. What else can I do to help prevent falls?  Wear shoes  that:  Do not have high heels.  Have rubber bottoms.  Are comfortable and fit you well.  Are closed at the toe. Do not wear sandals.  If you use a stepladder:  Make sure that it is fully opened. Do not climb a closed stepladder.  Make sure that both sides of the stepladder are locked into place.  Ask someone to hold it for you, if possible.  Clearly mark and make sure that you can see:  Any grab bars or handrails.  First and last steps.  Where the edge of each step is.  Use tools that help you move around (mobility aids) if they are needed. These include:  Canes.  Walkers.  Scooters.  Crutches.  Turn on the lights when you go into a dark area. Replace any light bulbs as soon as they burn out.  Set up your furniture so you have a clear path. Avoid moving your furniture around.  If any of your floors are uneven, fix them.  If there are any pets around you, be aware of where they are.  Review your medicines with your doctor. Some medicines can make you feel dizzy. This can increase your chance of falling. Ask your doctor what other things that you can do to help prevent falls. This information is not intended to replace advice given to you by your health care provider. Make sure you discuss any questions you have with your health care provider. Document Released: 05/05/2009 Document Revised: 12/15/2015 Document Reviewed: 08/13/2014 Elsevier Interactive Patient Education  2017 [employer].

## 2020-12-26 ENCOUNTER — Other Ambulatory Visit: Payer: Self-pay

## 2020-12-26 ENCOUNTER — Encounter: Payer: Self-pay | Admitting: Internal Medicine

## 2020-12-26 ENCOUNTER — Ambulatory Visit: Payer: Medicare HMO | Admitting: Internal Medicine

## 2020-12-26 VITALS — BP 122/78 | HR 67 | Ht 67.0 in | Wt 184.0 lb

## 2020-12-26 DIAGNOSIS — M62838 Other muscle spasm: Secondary | ICD-10-CM | POA: Insufficient documentation

## 2020-12-26 DIAGNOSIS — I10 Essential (primary) hypertension: Secondary | ICD-10-CM

## 2020-12-26 DIAGNOSIS — R3911 Hesitancy of micturition: Secondary | ICD-10-CM

## 2020-12-26 DIAGNOSIS — I5022 Chronic systolic (congestive) heart failure: Secondary | ICD-10-CM | POA: Diagnosis not present

## 2020-12-26 DIAGNOSIS — I480 Paroxysmal atrial fibrillation: Secondary | ICD-10-CM

## 2020-12-26 DIAGNOSIS — G5603 Carpal tunnel syndrome, bilateral upper limbs: Secondary | ICD-10-CM | POA: Insufficient documentation

## 2020-12-26 DIAGNOSIS — G5601 Carpal tunnel syndrome, right upper limb: Secondary | ICD-10-CM

## 2020-12-26 HISTORY — DX: Carpal tunnel syndrome, right upper limb: G56.01

## 2020-12-26 HISTORY — DX: Hesitancy of micturition: R39.11

## 2020-12-26 HISTORY — DX: Other muscle spasm: M62.838

## 2020-12-26 NOTE — Patient Instructions (Addendum)
Wear the wrist splint every night. Take 2 Tylenol at bedtime  Pick up Lidocaine patches 4% at the pharmacy.  Apply one each evening to left shoulder area. Remove it in the am (can leave on for 14 hours)

## 2020-12-26 NOTE — Progress Notes (Signed)
Date:  12/26/2020   Name:  Todd Weaver   DOB:  Sep 06, 1950   MRN:  [id number]   Chief Complaint: Hypertension and Arm Pain (Right arm pain. Longer than 2 months. Started in his fingers and he can feel "pins and needles and tingling." Right handed. 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 )  Hypertension This is a chronic problem. The problem is controlled. Associated symptoms include neck pain. Pertinent negatives include no chest pain, headaches, palpitations or shortness of breath. Past treatments include angiotensin blockers, calcium channel blockers and diuretics. The current treatment provides significant improvement. Hypertensive end-organ damage includes CAD/MI.  Arm Pain  There was no injury mechanism. The pain is present in the right wrist. The pain is mild. The pain has been fluctuating since the incident. Associated symptoms include muscle weakness and tingling. Pertinent negatives include no chest pain. He has tried nothing for the symptoms.  Neck Pain  This is a chronic problem. The problem has been unchanged. The pain is moderate. Exacerbated by: worse wehn trying to sleep. Associated symptoms include tingling. Pertinent negatives include no chest pain, headaches or trouble swallowing. He has tried nothing for the symptoms.  Urinary Frequency  This is a recurrent problem. The problem occurs every urination. The problem has been gradually worsening. The patient is experiencing no pain. There has been no fever. Associated symptoms include frequency, hesitancy and urgency. Pertinent negatives include no chills or hematuria. Treatments tried: flomax.    Lab Results  Component Value Date   CREATININE 1.01 12/05/2020   BUN 11 12/05/2020   NA 138 12/05/2020   K 4.0 12/05/2020   CL 105 12/05/2020   CO2 25 12/05/2020   Lab Results  Component Value Date   CHOL 153 04/11/2020   HDL 61 04/11/2020   LDLCALC 82 04/11/2020   LDLDIRECT 116 (H) 09/24/2017    TRIG 44 04/11/2020   CHOLHDL 2.5 04/11/2020   Lab Results  Component Value Date   TSH 1.232 07/19/2017   Lab Results  Component Value Date   HGBA1C 5.8 (H) 11/06/2017   Lab Results  Component Value Date   WBC 7.4 12/05/2020   HGB 14.9 12/05/2020   HCT 43.7 12/05/2020   MCV 81.8 12/05/2020   PLT 295 12/05/2020   Lab Results  Component Value Date   ALT 34 12/05/2020   AST 33 12/05/2020   ALKPHOS 54 12/05/2020   BILITOT 1.5 (H) 12/05/2020     Review of Systems  Constitutional: Negative for chills and unexpected weight change.  HENT: Negative for trouble swallowing.   Respiratory: Negative for cough, chest tightness, shortness of breath and wheezing.   Cardiovascular: Negative for chest pain, palpitations and leg swelling.  Gastrointestinal: Negative for abdominal pain.  Genitourinary: Positive for frequency, hesitancy and urgency. Negative for hematuria.  Musculoskeletal: Positive for arthralgias (right wrist) and neck pain.  Neurological: Positive for tingling. Negative for dizziness and headaches.    Patient Active Problem List   Diagnosis Date Noted  . Chronic systolic congestive heart failure ([place]) 09/29/2020  . Thrombophilia ([place]) 09/29/2020  . PAF (paroxysmal atrial fibrillation) ([place]) 04/11/2020  . PAD (peripheral artery disease) ([place]) 03/31/2019  . Rectal polyp   . Impingement syndrome of left shoulder region 10/14/2018  . Coronary artery disease of native artery of native heart with stable angina pectoris ([place]) 10/03/2018  . History of ST elevation myocardial infarction (STEMI) 07/18/2017  . Essential hypertension 06/19/2017  . Hyperlipidemia 06/19/2017  .  History of CVA (cerebrovascular accident) 06/19/2017  . Overweight (BMI 25.0-29.9) 06/19/2017  . BPH (benign prostatic hyperplasia) 06/19/2017  . ED (erectile dysfunction) 06/19/2017  . MCI (mild cognitive impairment) 06/19/2017  . Lumbar spondylosis with myelopathy 11/08/2016  . Hand dermatitis  07/26/2014    Allergies  Allergen Reactions  . Codeine Hives  . Shellfish Allergy Hives and Swelling  . Contrast Media [Iodinated Diagnostic Agents] Itching and Other (See Comments)    Whelts on tongue  . Fish Allergy Itching    Past Surgical History:  Procedure Laterality Date  . ANAL FISTULECTOMY N/A 12/21/2014   Procedure: FISTULECTOMY ANAL;  Surgeon: Christene Lye, MD;  Location: [facility] ORS;  Service: General;  Laterality: N/A;  . APPENDECTOMY  1975  . CARDIAC CATHETERIZATION    . COLONOSCOPY WITH PROPOFOL N/A 02/12/2019   Procedure: COLONOSCOPY WITH PROPOFOL;  Surgeon: Lucilla Lame, MD;  Location: [facility] ENDOSCOPY;  Service: Endoscopy;  Laterality: N/A;  . CORONARY STENT INTERVENTION N/A 09/09/2017   Procedure: CORONARY STENT INTERVENTION;  Surgeon: Wellington Hampshire, MD;  Location: [place] CV LAB;  Service: Cardiovascular;  Laterality: N/A;  . CORONARY/GRAFT ACUTE MI REVASCULARIZATION N/A 07/18/2017   Procedure: Coronary/Graft Acute MI Revascularization;  Surgeon: Wellington Hampshire, MD;  Location: [place] CV LAB;  Service: Cardiovascular;  Laterality: N/A;  . HEMORRHOID SURGERY N/A 12/21/2014   Procedure: HEMORRHOIDECTOMY;  Surgeon: Christene Lye, MD;  Location: [facility] ORS;  Service: General;  Laterality: N/A;  . INTRAVASCULAR ULTRASOUND/IVUS N/A 09/09/2017   Procedure: Intravascular Ultrasound/IVUS;  Surgeon: Wellington Hampshire, MD;  Location: [place] CV LAB;  Service: Cardiovascular;  Laterality: N/A;  . KNEE SURGERY Right age 41  . LEFT HEART CATH AND CORONARY ANGIOGRAPHY N/A 07/18/2017   Procedure: LEFT HEART CATH AND CORONARY ANGIOGRAPHY;  Surgeon: Wellington Hampshire, MD;  Location: [place] CV LAB;  Service: Cardiovascular;  Laterality: N/A;  . LEFT HEART CATH AND CORONARY ANGIOGRAPHY Left 08/19/2017   Procedure: LEFT HEART CATH AND CORONARY ANGIOGRAPHY;  Surgeon: Wellington Hampshire, MD;  Location: [place] CV LAB;  Service: Cardiovascular;   Laterality: Left;  . LEFT HEART CATH AND CORONARY ANGIOGRAPHY N/A 11/07/2017   Procedure: LEFT HEART CATH AND CORONARY ANGIOGRAPHY;  Surgeon: Wellington Hampshire, MD;  Location: [place] CV LAB;  Service: Cardiovascular;  Laterality: N/A;  . ROTATOR CUFF REPAIR Right 2013    Social History   Tobacco Use  . Smoking status: Former Smoker    Types: Cigarettes    Quit date: 07/24/2007    Years since quitting: 13.4  . Smokeless tobacco: Never Used  . Tobacco comment: smoking cessation materials not required  Vaping Use  . Vaping Use: Never used  Substance Use Topics  . Alcohol use: Yes    Alcohol/week: 5.0 - 6.0 standard drinks    Types: 5 - 6 Cans of beer per week    Comment:  weekly  . Drug use: No     Medication list has been reviewed and updated.  Current Meds  Medication Sig  . amLODipine (NORVASC) 5 MG tablet Take 1 tablet (5 mg total) by mouth daily.  Marland Kitchen aspirin EC 81 MG tablet Take 81 mg by mouth daily.   Marland Kitchen atorvastatin (LIPITOR) 80 MG tablet TAKE 1 TABLET EVERY DAY AT 6:00PM  . clopidogrel (PLAVIX) 75 MG tablet TAKE 1 TABLET (75 MG TOTAL) BY MOUTH DAILY.  Marland Kitchen ezetimibe (ZETIA) 10 MG tablet TAKE 1 TABLET EVERY DAY  . furosemide (LASIX) 20 MG  tablet TAKE 1 TABLET (20 MG TOTAL) BY MOUTH DAILY.  Marland Kitchen losartan (COZAAR) 100 MG tablet Take 1 tablet (100 mg total) by mouth daily. (Patient taking differently: Take 25 mg by mouth daily.)  . nitroGLYCERIN (NITROSTAT) 0.4 MG SL tablet Place 1 tablet (0.4 mg total) under the tongue every 5 (five) minutes as needed for chest pain.  . potassium chloride (KLOR-CON) 10 MEQ tablet TAKE 1 TABLET (10 MEQ TOTAL) BY MOUTH DAILY.  . tamsulosin (FLOMAX) 0.4 MG CAPS capsule TAKE 1 CAPSULE (0.4 MG TOTAL) BY MOUTH DAILY.    PHQ 2/9 Scores 12/26/2020 12/12/2020 09/29/2020 12/11/2019  PHQ - 2 Score 0 0 0 0  PHQ- 9 Score 3 6 0 0    GAD 7 : Generalized Anxiety Score 12/26/2020 09/29/2020 12/11/2019  Nervous, Anxious, on Edge 0 0 0  Control/stop worrying 0 0 0   Worry too much - different things 0 0 0  Trouble relaxing 1 0 0  Restless 1 0 0  Easily annoyed or irritable 0 0 0  Afraid - awful might happen 0 0 0  Total GAD 7 Score 2 0 0  Anxiety Difficulty Not difficult at all - Not difficult at all    BP Readings from Last 3 Encounters:  12/26/20 122/78  12/12/20 122/80  12/05/20 (!) 158/72    Physical Exam Vitals and nursing note reviewed.  Constitutional:      General: He is not in acute distress.    Appearance: Normal appearance. He is well-developed.  HENT:     Head: Normocephalic and atraumatic.  Cardiovascular:     Rate and Rhythm: Normal rate and regular rhythm.     Pulses: Normal pulses.     Heart sounds: No murmur heard.   Pulmonary:     Effort: Pulmonary effort is normal. No respiratory distress.     Breath sounds: No wheezing or rhonchi.  Musculoskeletal:     Right wrist: No tenderness or bony tenderness. Normal range of motion.     Cervical back: Spasms and tenderness present. Decreased range of motion.     Right lower leg: No edema.     Left lower leg: No edema.     Comments: Negative Tinel's and Phalen's Grip intact Decreased sensation distal index finger  Skin:    General: Skin is warm and dry.     Findings: No rash.  Neurological:     Mental Status: He is alert and oriented to person, place, and time.  Psychiatric:        Mood and Affect: Mood normal.        Behavior: Behavior normal.     Wt Readings from Last 3 Encounters:  12/26/20 184 lb (83.5 kg)  12/12/20 184 lb 12.8 oz (83.8 kg)  12/05/20 184 lb (83.5 kg)    BP 122/78   Pulse 67   Ht 5\' 7"  (1.702 m)   Wt 184 lb (83.5 kg)   SpO2 96%   BMI 28.82 kg/m   Assessment and Plan: 1. Essential hypertension Clinically stable exam with well controlled BP. Tolerating medications without side effects at this time. Pt to continue current regimen and low sodium diet; benefits of regular exercise as able discussed.  2. Carpal tunnel syndrome of right  wrist Suspected - trial of tylenol at bedtime and wrist splint while sleeping If no improvement, will refer to Ortho  3. Urinary hesitancy On Flomax but still having symtpoms - Ambulatory referral to Urology  4. Neck muscle spasm Recommend Tylenol  at bedtime (for CTS) Place Lidoderm patch every evening and remove in the AM  5. Chronic systolic congestive heart failure (HCC) Followed by Dr. Laurette Schimke well compenstated  6. PAF (paroxysmal atrial fibrillation) ([place]) In SR today; continue aspirin and Plavix   Partially dictated using Editor, commissioning. Any errors are unintentional.  Halina Maidens, MD [facility]  12/26/2020

## 2021-01-03 ENCOUNTER — Ambulatory Visit (INDEPENDENT_AMBULATORY_CARE_PROVIDER_SITE_OTHER): Payer: Medicare HMO | Admitting: Urology

## 2021-01-03 ENCOUNTER — Other Ambulatory Visit: Payer: Self-pay

## 2021-01-03 ENCOUNTER — Other Ambulatory Visit
Admission: RE | Admit: 2021-01-03 | Discharge: 2021-01-03 | Disposition: A | Discharge status: Home / Self Care | Payer: Medicare HMO | Attending: Urology | Admitting: Urology

## 2021-01-03 ENCOUNTER — Encounter: Payer: Self-pay | Admitting: Urology

## 2021-01-03 VITALS — BP 155/83 | HR 55 | Ht 67.0 in | Wt 184.0 lb

## 2021-01-03 DIAGNOSIS — R351 Nocturia: Secondary | ICD-10-CM

## 2021-01-03 DIAGNOSIS — R3911 Hesitancy of micturition: Secondary | ICD-10-CM | POA: Insufficient documentation

## 2021-01-03 DIAGNOSIS — N138 Other obstructive and reflux uropathy: Secondary | ICD-10-CM | POA: Diagnosis not present

## 2021-01-03 DIAGNOSIS — N3281 Overactive bladder: Secondary | ICD-10-CM | POA: Diagnosis not present

## 2021-01-03 DIAGNOSIS — N401 Enlarged prostate with lower urinary tract symptoms: Secondary | ICD-10-CM

## 2021-01-03 LAB — URINALYSIS, COMPLETE (UACMP) WITH MICROSCOPIC
Bilirubin Urine: NEGATIVE
Glucose, UA: NEGATIVE mg/dL
Hgb urine dipstick: NEGATIVE
Ketones, ur: NEGATIVE mg/dL
Leukocytes,Ua: NEGATIVE
Nitrite: NEGATIVE
Protein, ur: NEGATIVE mg/dL
Specific Gravity, Urine: 1.01 (ref 1.005–1.030)
pH: 5 (ref 5.0–8.0)

## 2021-01-03 LAB — BLADDER SCAN AMB NON-IMAGING

## 2021-01-03 NOTE — Progress Notes (Signed)
01/03/21 10:13 AM   Todd Weaver Jun 06, 1951 [id number]  CC: Urinary frequency, urgency, nocturia  HPI: I saw [name] for the above issues.  He is a 70 year old male with extensive cardiac history who presents with urinary urgency, frequency, and nocturia 3-4 times per night.  He is primarily bothered by the nocturia.  IPSS score today is 22, with quality of life mostly dissatisfied.  PVR is normal at 74 mL.  PSA is normal at 1.5.  Urinalysis today is completely benign.  He takes his Lasix in the morning.  He drinks 3-4 beers a day, and drinks a significant mount of fluid as he is getting up overnight.  He has been on Flomax for the last few years which she feels minimally improves his urinary symptoms.  He denies any gross hematuria, UTIs, or dysuria.    PMH: Past Medical History:  Diagnosis Date   Acute ST elevation myocardial infarction (STEMI) of inferior wall ([place]) 07/18/2017   CAD (coronary artery disease)    a. 03/2016 MV: EF 55-65%, low risk; b. 06/2017 Inf STEMI: LM 85d, LAD 60p, RCA 99ost (3.25x18 [place] DES/3.0x38 [place] DES), RPAV 60; c. 08/2017 PCI dLM/LAD (3.0x38 Xience [place] DES); d. 10/2017 Cath: LM/prox LAD patent, LAD 8m, 40d, RI 70, LCX 40p, RCA patent stent. EF 50-55; e. 08/2019 MV: Low risk EF 55-65%.   Contrast media allergy    GERD (gastroesophageal reflux disease)    Hemorrhoid    Hyperlipidemia    Hypertension    Ischemic cardiomyopathy    a. 06/2017 Echo: EF 45-50%, mod inf/infsept HK. Mildly dil LA; b. 09/2019 Echo: EF 45-50%, Gr2 DD. Mild MR.   PAD (peripheral artery disease) ([place])    a. 01/2019 ABIs and Duplex: ABI R 0.78, L 0.79; TBI R 0.66, L 1.11. RCIA >50p, RSFA 50-74. LCIA >50p, LSFA 75-99p, 30-49d. 3 vessel runoff bilat.   PAF (paroxysmal atrial fibrillation) ([place])    a. Brief episode during December hospitalization-->converted on IV amio-->not on [place].   Stroke [facility]) 2011   "2 MINI STROKES". no deficits.   Unstable angina [facility])      Surgical History: Past Surgical History:  Procedure Laterality Date   ANAL FISTULECTOMY N/A 12/21/2014   Procedure: FISTULECTOMY ANAL;  Surgeon: Christene Lye, MD;  Location: [facility] ORS;  Service: General;  Laterality: N/A;   APPENDECTOMY  1975   CARDIAC CATHETERIZATION     COLONOSCOPY WITH PROPOFOL N/A 02/12/2019   Procedure: COLONOSCOPY WITH PROPOFOL;  Surgeon: Lucilla Lame, MD;  Location: [facility] ENDOSCOPY;  Service: Endoscopy;  Laterality: N/A;   CORONARY STENT INTERVENTION N/A 09/09/2017   Procedure: CORONARY STENT INTERVENTION;  Surgeon: Wellington Hampshire, MD;  Location: [place] CV LAB;  Service: Cardiovascular;  Laterality: N/A;   CORONARY/GRAFT ACUTE MI REVASCULARIZATION N/A 07/18/2017   Procedure: Coronary/Graft Acute MI Revascularization;  Surgeon: Wellington Hampshire, MD;  Location: [place] CV LAB;  Service: Cardiovascular;  Laterality: N/A;   HEMORRHOID SURGERY N/A 12/21/2014   Procedure: HEMORRHOIDECTOMY;  Surgeon: Christene Lye, MD;  Location: [facility] ORS;  Service: General;  Laterality: N/A;   INTRAVASCULAR ULTRASOUND/IVUS N/A 09/09/2017   Procedure: Intravascular Ultrasound/IVUS;  Surgeon: Wellington Hampshire, MD;  Location: [place] CV LAB;  Service: Cardiovascular;  Laterality: N/A;   KNEE SURGERY Right age 14   LEFT HEART CATH AND CORONARY ANGIOGRAPHY N/A 07/18/2017   Procedure: LEFT HEART CATH AND CORONARY ANGIOGRAPHY;  Surgeon: Wellington Hampshire, MD;  Location: [place] CV LAB;  Service: Cardiovascular;  Laterality: N/A;   LEFT HEART CATH AND CORONARY ANGIOGRAPHY Left 08/19/2017   Procedure: LEFT HEART CATH AND CORONARY ANGIOGRAPHY;  Surgeon: Wellington Hampshire, MD;  Location: [place] CV LAB;  Service: Cardiovascular;  Laterality: Left;   LEFT HEART CATH AND CORONARY ANGIOGRAPHY N/A 11/07/2017   Procedure: LEFT HEART CATH AND CORONARY ANGIOGRAPHY;  Surgeon: Wellington Hampshire, MD;  Location: [place] CV LAB;  Service: Cardiovascular;   Laterality: N/A;   ROTATOR CUFF REPAIR Right 2013       Family History: Family History  Problem Relation Age of Onset   Hypertension Mother    Alzheimer's disease Mother    Hypertension Father    Alzheimer's disease Father     Social History:  reports that he quit smoking about 13 years ago. His smoking use included cigarettes. He has never used smokeless tobacco. He reports current alcohol use of about 5.0 - 6.0 standard drinks of alcohol per week. He reports that he does not use drugs.  Physical Exam: BP (!) 155/83   Pulse (!) 55   Ht 5\' 7"  (1.702 m)   Wt 184 lb (83.5 kg)   BMI 28.82 kg/m    Constitutional:  Alert and oriented, No acute distress. Cardiovascular: No clubbing, cyanosis, or edema. Respiratory: Normal respiratory effort, no increased work of breathing. GI: Abdomen is soft, nontender, nondistended, no abdominal masses GU: Circumcised phallus with patent meatus, no lesions, testicles 20 cc and descended bilaterally DRE: 50 g, smooth, no nodules or masses   Laboratory Data: Reviewed, see HPI  Pertinent Imaging: None to review  Assessment & Plan:   70 year old male with urinary frequency and nocturia likely secondary to high fluid intake and alcohol intake during the day.  We discussed primarily behavioral strategies today including minimizing alcohol, minimizing fluid before bed, avoiding excess fluid intake overnight.  We discussed other options like a trial of an OAB medication like oxybutynin, or even cystoscopy and TRUS.  I think behavioral strategies will be very helpful for him and are good starting point.  Behavioral strategies discussed extensively RTC 3 months PVR and symptom check, consider oxybutynin at that time if persistently bothersome symptoms   Nickolas Madrid, MD 01/03/2021  [place] [address], [place], [place] [address] [phone]

## 2021-01-03 NOTE — Patient Instructions (Signed)
Cut back on alcohol and beers, as this causes urinary frequency and urgency.  Also minimize the fluid you drink 3 to 4 hours before bed, urinate right before going to bed, and do not drink fluid overnight.

## 2021-01-05 ENCOUNTER — Other Ambulatory Visit: Payer: Self-pay

## 2021-01-05 ENCOUNTER — Encounter: Payer: Self-pay | Admitting: Cardiovascular Disease

## 2021-01-05 ENCOUNTER — Ambulatory Visit (INDEPENDENT_AMBULATORY_CARE_PROVIDER_SITE_OTHER): Payer: Medicare HMO | Admitting: Cardiovascular Disease

## 2021-01-05 VITALS — BP 140/80 | HR 59 | Ht 67.0 in | Wt 185.5 lb

## 2021-01-05 DIAGNOSIS — R079 Chest pain, unspecified: Secondary | ICD-10-CM

## 2021-01-05 DIAGNOSIS — I25118 Atherosclerotic heart disease of native coronary artery with other forms of angina pectoris: Secondary | ICD-10-CM | POA: Diagnosis not present

## 2021-01-05 DIAGNOSIS — I201 Angina pectoris with documented spasm: Secondary | ICD-10-CM | POA: Diagnosis not present

## 2021-01-05 DIAGNOSIS — I2510 Atherosclerotic heart disease of native coronary artery without angina pectoris: Secondary | ICD-10-CM

## 2021-01-05 MED ORDER — SILDENAFIL CITRATE 20 MG PO TABS: 20.0000 mg | ORAL_TABLET | Freq: Three times a day (TID) | ORAL | 1 refills | Status: DC | PRN

## 2021-01-05 NOTE — Progress Notes (Signed)
Cardiology Office Note   Date:  01/05/2021   ID:  Todd Weaver, DOB [birth year], MRN [id number]  PCP:  Todd Hess, MD  Cardiologist:   Todd Rogue, MD   Chief Complaint  Patient presents with   Other    6 month f/u c/o sob. Meds reviewed verbally with pt.     History of Present Illness: Todd Weaver is a 70 y.o. male who presents for a follow-up visit regarding coronary artery disease inferior ST elevation myocardial infarction complicated by ventricular fibrillation arrest in December 2018  PCI and 2 overlapped drug-eluting stent placement to the right coronary artery.  significant ostial LAD stenosis, borderline significant proximal LAD stenosis and moderate distal LAD stenosis.   Ejection fraction was mildly reduced.   atrial fibrillation after the procedure treated with amiodarone. A staged IVUS guided drug-eluting stent placement to the proximal and ostial LAD was done in February 2019.  cardiac catheterization April 2019 showed widely patent LAD and RCA stent with no significant restenosis.  The ramus branch was pinched by the LAD stent with 70% ostial stenosis.  EF was 50 to 55%. ---previous stroke, hyperlipidemia, previous tobacco use and BPH. Who presents for routine follow-up of his coronary disease, stable angina  Last seen in clinic December 2021  In general reports doing well though concerned about some left-sided chest pain radiating into his left arm Typically is in bed when he has symptoms, sometimes laying on his left side when he presents Happened a few times Concerned felt similar to prior angina  Active in the day, "puts additions on houses" No chest pain during day, just fatigue which she is also concerned about  Reports blood pressure well controlled at home, tolerating his medications  EKG personally reviewed by myself on todays visit Normal sinus rhythm rate 59 bpm nonspecific T wave abnormality  Other past medical history  reviewed History of peripheral arterial disease being treated medically.   Noninvasive vascular studies in July 2020 showed moderately reduced ABI  in the 0.7 range bilaterally with diffuse moderate SFA disease worse on the left side.   repeat ABI done in July 2021 which was stable overall.  nuclear stress test in February 2021 showed no evidence of ischemia with normal ejection fraction.   Echocardiogram in March showed an EF of 45 to 50% with mild mitral regurgitation. Past Medical History:  Diagnosis Date   Acute ST elevation myocardial infarction (STEMI) of inferior wall ([place]) 07/18/2017   CAD (coronary artery disease)    a. 03/2016 MV: EF 55-65%, low risk; b. 06/2017 Inf STEMI: LM 85d, LAD 60p, RCA 99ost (3.25x18 Xience [place] DES/3.0x38 [place] DES), RPAV 60; c. 08/2017 PCI dLM/LAD (3.0x38 Xience [place] DES); d. 10/2017 Cath: LM/prox LAD patent, LAD 90m, 40d, RI 70, LCX 40p, RCA patent stent. EF 50-55; e. 08/2019 MV: Low risk EF 55-65%.   Contrast media allergy    GERD (gastroesophageal reflux disease)    Hemorrhoid    Hyperlipidemia    Hypertension    Ischemic cardiomyopathy    a. 06/2017 Echo: EF 45-50%, mod inf/infsept HK. Mildly dil LA; b. 09/2019 Echo: EF 45-50%, Gr2 DD. Mild MR.   PAD (peripheral artery disease) ([place])    a. 01/2019 ABIs and Duplex: ABI R 0.78, L 0.79; TBI R 0.66, L 1.11. RCIA >50p, RSFA 50-74. LCIA >50p, LSFA 75-99p, 30-49d. 3 vessel runoff bilat.   PAF (paroxysmal atrial fibrillation) ([place])    a. Brief episode during December hospitalization-->converted on  IV amio-->not on [place].   Stroke [facility]) 2011   "2 MINI STROKES". no deficits.   Unstable angina [facility])     Past Surgical History:  Procedure Laterality Date   ANAL FISTULECTOMY N/A 12/21/2014   Procedure: FISTULECTOMY ANAL;  Surgeon: Christene Lye, MD;  Location: [facility] ORS;  Service: General;  Laterality: N/A;   APPENDECTOMY  1975   CARDIAC CATHETERIZATION     COLONOSCOPY WITH PROPOFOL N/A 02/12/2019    Procedure: COLONOSCOPY WITH PROPOFOL;  Surgeon: Lucilla Lame, MD;  Location: [facility] ENDOSCOPY;  Service: Endoscopy;  Laterality: N/A;   CORONARY STENT INTERVENTION N/A 09/09/2017   Procedure: CORONARY STENT INTERVENTION;  Surgeon: Wellington Hampshire, MD;  Location: [facility];  Service: Cardiovascular;  Laterality: N/A;   CORONARY/GRAFT ACUTE MI REVASCULARIZATION N/A 07/18/2017   Procedure: Coronary/Graft Acute MI Revascularization;  Surgeon: Wellington Hampshire, MD;  Location: [place] [facility];  Service: Cardiovascular;  Laterality: N/A;   HEMORRHOID SURGERY N/A 12/21/2014   Procedure: HEMORRHOIDECTOMY;  Surgeon: Christene Lye, MD;  Location: [facility] ORS;  Service: General;  Laterality: N/A;   INTRAVASCULAR ULTRASOUND/IVUS N/A 09/09/2017   Procedure: Intravascular Ultrasound/IVUS;  Surgeon: Wellington Hampshire, MD;  Location: [place] [facility];  Service: Cardiovascular;  Laterality: N/A;   KNEE SURGERY Right age 5   LEFT HEART CATH AND CORONARY ANGIOGRAPHY N/A 07/18/2017   Procedure: LEFT HEART CATH AND CORONARY ANGIOGRAPHY;  Surgeon: Wellington Hampshire, MD;  Location: [place] [facility];  Service: Cardiovascular;  Laterality: N/A;   LEFT HEART CATH AND CORONARY ANGIOGRAPHY Left 08/19/2017   Procedure: LEFT HEART CATH AND CORONARY ANGIOGRAPHY;  Surgeon: Wellington Hampshire, MD;  Location: [place] [facility];  Service: Cardiovascular;  Laterality: Left;   LEFT HEART CATH AND CORONARY ANGIOGRAPHY N/A 11/07/2017   Procedure: LEFT HEART CATH AND CORONARY ANGIOGRAPHY;  Surgeon: Wellington Hampshire, MD;  Location: [place] [facility];  Service: Cardiovascular;  Laterality: N/A;   ROTATOR CUFF REPAIR Right 2013     Current Outpatient Medications  Medication Sig Dispense Refill   amLODipine (NORVASC) 5 MG tablet Take 1 tablet (5 mg total) by mouth daily. 90 tablet 3   aspirin EC 81 MG tablet Take 81 mg by mouth daily.      atorvastatin (LIPITOR) 80 MG tablet TAKE 1 TABLET EVERY DAY AT  6:00PM 90 tablet 3   carvedilol (COREG) 3.125 MG tablet Take 3.125 mg by mouth 2 (two) times daily with a meal.     clopidogrel (PLAVIX) 75 MG tablet TAKE 1 TABLET (75 MG TOTAL) BY MOUTH DAILY. 90 tablet 0   ezetimibe (ZETIA) 10 MG tablet TAKE 1 TABLET EVERY DAY 90 tablet 1   furosemide (LASIX) 20 MG tablet TAKE 1 TABLET (20 MG TOTAL) BY MOUTH DAILY. 90 tablet 0   nitroGLYCERIN (NITROSTAT) 0.4 MG SL tablet Place 1 tablet (0.4 mg total) under the tongue every 5 (five) minutes as needed for chest pain. 30 tablet 0   potassium chloride (KLOR-CON) 10 MEQ tablet TAKE 1 TABLET (10 MEQ TOTAL) BY MOUTH DAILY. 90 tablet 3   tamsulosin (FLOMAX) 0.4 MG CAPS capsule TAKE 1 CAPSULE (0.4 MG TOTAL) BY MOUTH DAILY. 90 capsule 3   No current facility-administered medications for this visit.    Allergies:   Codeine, Shellfish allergy, Contrast media [iodinated diagnostic agents], and Fish allergy    Social History:  The patient  reports that he quit smoking about 13 years ago. His smoking use included cigarettes.  He has never used smokeless tobacco. He reports current alcohol use of about 5.0 - 6.0 standard drinks of alcohol per week. He reports that he does not use drugs.   Family History:  The patient's family history includes Alzheimer's disease in his father and mother; Hypertension in his father and mother.    ROS:   Review of Systems  Constitutional: Negative.   HENT: Negative.    Respiratory: Negative.    Cardiovascular:  Positive for chest pain.  Gastrointestinal: Negative.   Musculoskeletal: Negative.   Neurological: Negative.   Psychiatric/Behavioral: Negative.    All other systems reviewed and are negative.   PHYSICAL EXAM: VS:  BP 140/80 (BP Location: Left Arm, Patient Position: Sitting, Cuff Size: Normal)   Pulse (!) 59   Ht 5\' 7"  (1.702 m)   Wt 185 lb 8 oz (84.1 kg)   SpO2 98%   BMI 29.05 kg/m  , BMI Body mass index is 29.05 kg/m. GEN: Well nourished, well developed, in no acute  distress  HEENT: normal  Neck: no JVD, carotid bruits, or masses Cardiac: RRR; no murmurs, rubs, or gallops,no edema  Respiratory:  clear to auscultation bilaterally, normal work of breathing GI: soft, nontender, nondistended, + BS MS: no deformity or atrophy  Skin: warm and dry, no rash Neuro:  Strength and sensation are intact Psych: euthymic mood, full affect  Recent Labs: 12/05/2020: ALT 34; BUN 11; Creatinine, Ser 1.01; Hemoglobin 14.9; Platelets 295; Potassium 4.0; Sodium 138    Lipid Panel    Component Value Date/Time   CHOL 153 04/11/2020 0914   TRIG 44 04/11/2020 0914   HDL 61 04/11/2020 0914   CHOLHDL 2.5 04/11/2020 0914   CHOLHDL 2.8 11/07/2017 0511   VLDL 4 11/07/2017 0511   LDLCALC 82 04/11/2020 0914   LDLDIRECT 116 (H) 09/24/2017 1338      Wt Readings from Last 3 Encounters:  01/05/21 185 lb 8 oz (84.1 kg)  01/03/21 184 lb (83.5 kg)  12/26/20 184 lb (83.5 kg)      PAD Screen 03/13/2016  Previous PAD dx? No  Previous surgical procedure? No  Pain with walking? No  Feet/toe relief with dangling? Yes  Painful, non-healing ulcers? No  Extremities discolored? No      ASSESSMENT AND PLAN:  1.  Peripheral arterial disease:  Denies claudication symptoms Will have follow-up with Dr. Fletcher Anon in 6 months  2. Coronary artery disease involving native coronary arteries with stable angina:  Reports having some chest pain, arm pain, though atypical in nature he is concerned about angina Prior imaging studies reviewed with him, stress test early 2021, he is requesting repeat evaluation Lexiscan Myoview has been ordered  3.  Chronic systolic/diastolic heart failure: Euvolemic on today's visit, weight unchanged Stressed importance of monitoring blood pressure at home  4.  Hyperlipidemia:  Continue high-dose atorvastatin and Zetia.   Recommend weight loss, will have repeat blood work with primary care, goal LDL less than 70  5.  Essential hypertension:  History  of chronic bradycardia Reports blood pressure well controlled at home, mildly elevated today He will continue to monitor   Signed,  Todd Rogue, MD  01/05/2021 11:36 AM    [place]

## 2021-01-05 NOTE — Patient Instructions (Addendum)
Medication Instructions:  We will print sildenafil script with goodrx coupon  If you need a refill on your cardiac medications before your next appointment, please call your pharmacy.    Lab work: No new labs needed  Testing/Procedures: Lexicographer    Follow-Up: At [facility], you and your health needs are our priority.  As part of our continuing mission to provide you with exceptional heart care, we have created designated Provider Care Teams.  These Care Teams include your primary Cardiologist (physician) and Advanced Practice Providers (APPs -  Physician Assistants and Nurse Practitioners) who all work together to provide you with the care you need, when you need it.  You will need a follow up appointment in 6 months with Dr. Fletcher Anon  Providers on your designated Care Team:   Murray Hodgkins, NP [name], PA-C Marrianne Mood, PA-C  COVID-19 Vaccine Information can be found at: [email or web address].[email or web address].uk For questions related to vaccine distribution or appointments, please email [email or web address] or call [phone].   [facility] MYOVIEW Veterinary surgeon)  Your caregiver has ordered a Stress Test with nuclear imaging. The purpose of this test is to evaluate the blood supply to your heart muscle. This procedure is referred to as a "Non-Invasive Stress Test." This is because other than having an IV started in your vein, nothing is inserted or "invades" your body. Cardiac stress tests are done to find areas of poor blood flow to the heart by determining the extent of coronary artery disease (CAD). Some patients exercise on a treadmill, which naturally increases the blood flow to your heart, while others who are  unable to walk on a treadmill due to physical limitations have a pharmacologic/chemical stress agent called Lexiscan . This medicine will mimic walking on a treadmill by temporarily increasing your coronary blood flow.    Please note: these test may take anywhere between 2-4 hours to complete  PLEASE REPORT TO [place] AT THE FIRST DESK WILL DIRECT YOU WHERE TO GO  Instructions regarding medication:   __X__:  Hold betablocker(s) night before procedure and morning of procedure Carvedilol (coreg)  _X___:  Hold other medications as follows: Lasix  How to prepare for your Myoview test:  Do not eat or drink after midnight No caffeine for 24 hours prior to test No smoking 24 hours prior to test. ALL your medication may be taken with a few sips of water.   (Except for the meds mention above) Please wear a short sleeve shirt. Comfortable pants are appropriate. No perfume, cologne or lotion. Wear comfortable walking shoes.   PLEASE NOTIFY THE OFFICE AT LEAST 78 HOURS IN ADVANCE IF YOU ARE UNABLE TO KEEP YOUR APPOINTMENT.  [phone] AND  PLEASE NOTIFY NUCLEAR MEDICINE AT [facility] AT LEAST 24 HOURS IN ADVANCE IF YOU ARE UNABLE TO KEEP YOUR APPOINTMENT. [phone]

## 2021-01-17 ENCOUNTER — Other Ambulatory Visit: Payer: Self-pay | Admitting: *Deleted

## 2021-01-17 MED ORDER — SILDENAFIL CITRATE 20 MG PO TABS: 20.0000 mg | ORAL_TABLET | Freq: Three times a day (TID) | ORAL | 1 refills | Status: DC | PRN

## 2021-01-18 ENCOUNTER — Ambulatory Visit
Admission: RE | Admit: 2021-01-18 | Discharge: 2021-01-18 | Disposition: A | Discharge status: Home / Self Care | Payer: Medicare HMO | Source: Ambulatory Visit | Attending: Cardiovascular Disease | Admitting: Cardiovascular Disease

## 2021-01-18 ENCOUNTER — Other Ambulatory Visit: Payer: Self-pay

## 2021-01-18 DIAGNOSIS — I201 Angina pectoris with documented spasm: Secondary | ICD-10-CM | POA: Insufficient documentation

## 2021-01-18 DIAGNOSIS — I2510 Atherosclerotic heart disease of native coronary artery without angina pectoris: Secondary | ICD-10-CM | POA: Insufficient documentation

## 2021-01-18 LAB — NM MYOCAR MULTI W/SPECT W/WALL MOTION / EF
LV dias vol: 119 mL (ref 62–150)
LV sys vol: 58 mL
Peak HR: 87 {beats}/min
Percent HR: 57 %
Rest HR: 48 {beats}/min
SDS: 2
SRS: 0
SSS: 1
TID: 0.92

## 2021-01-18 MED ORDER — REGADENOSON 0.4 MG/5ML IV SOLN
0.4000 mg | Freq: Once | INTRAVENOUS | Status: AC
  Administered 2021-01-18: 0.4 mg via INTRAVENOUS

## 2021-01-18 MED ORDER — TECHNETIUM TC 99M TETROFOSMIN IV KIT
30.0000 | PACK | Freq: Once | INTRAVENOUS | Status: AC | PRN
  Administered 2021-01-18: 31.63 via INTRAVENOUS

## 2021-01-18 MED ORDER — TECHNETIUM TC 99M TETROFOSMIN IV KIT
10.0000 | PACK | Freq: Once | INTRAVENOUS | Status: AC | PRN
  Administered 2021-01-18: 11.04 via INTRAVENOUS

## 2021-01-24 ENCOUNTER — Telehealth: Payer: Self-pay

## 2021-01-24 NOTE — Telephone Encounter (Signed)
Left detail message on VM of pt's recent results okay by DPR, Dr. Rockey Situ advised   "Stress test  No ischemia, mildly depressed ejection fraction as noted on echo 2021  No further work-up needed"  At this time, no further recommendations or medications changes, advised to call office for any concerns or questions, otherwise will see at next visit.   Results are also released to MyChart with Dr. Donivan Scull report

## 2021-01-25 ENCOUNTER — Other Ambulatory Visit: Payer: Self-pay | Admitting: *Deleted

## 2021-01-25 ENCOUNTER — Other Ambulatory Visit: Payer: Self-pay | Admitting: Internal Medicine

## 2021-01-25 DIAGNOSIS — E785 Hyperlipidemia, unspecified: Secondary | ICD-10-CM

## 2021-01-26 MED ORDER — CARVEDILOL 3.125 MG PO TABS: 3.1250 mg | ORAL_TABLET | Freq: Two times a day (BID) | ORAL | 2 refills | Status: DC

## 2021-01-30 NOTE — Progress Notes (Signed)
Pt's lab results mailed to pt, pt is not utilizing his MyChart account to see results   

## 2021-02-09 ENCOUNTER — Other Ambulatory Visit: Payer: Self-pay | Admitting: Internal Medicine

## 2021-02-09 MED ORDER — CLOPIDOGREL BISULFATE 75 MG PO TABS: 75.0000 mg | ORAL_TABLET | Freq: Every day | ORAL | 0 refills | Status: DC

## 2021-02-09 NOTE — Telephone Encounter (Signed)
Medication Refill - Medication: clopidogrel (PLAVIX) 75 MG tablet   Has the patient contacted their pharmacy? No. Patient states he is completley out and would like request expedited. Patient states he will call the pharmacy next time in advance   Preferred Pharmacy (with phone number or street name):   [facility] [place], [place] [address] AT [address] Phone:  [phone]  Fax:  [phone]      Agent: Please be advised that RX refills may take up to 3 business days. We ask that you follow-up with your pharmacy.

## 2021-04-04 ENCOUNTER — Ambulatory Visit: Payer: Self-pay | Admitting: Urology

## 2021-04-13 ENCOUNTER — Ambulatory Visit (INDEPENDENT_AMBULATORY_CARE_PROVIDER_SITE_OTHER): Payer: Medicare HMO | Admitting: Internal Medicine

## 2021-04-13 ENCOUNTER — Other Ambulatory Visit: Payer: Self-pay

## 2021-04-13 ENCOUNTER — Telehealth: Payer: Self-pay

## 2021-04-13 ENCOUNTER — Ambulatory Visit
Admission: RE | Admit: 2021-04-13 | Discharge: 2021-04-13 | Disposition: A | Discharge status: Home / Self Care | Payer: Medicare HMO | Attending: Internal Medicine | Admitting: Internal Medicine

## 2021-04-13 ENCOUNTER — Encounter: Payer: Self-pay | Admitting: Internal Medicine

## 2021-04-13 ENCOUNTER — Ambulatory Visit
Admission: RE | Admit: 2021-04-13 | Discharge: 2021-04-13 | Disposition: A | Discharge status: Home / Self Care | Payer: Medicare HMO | Source: Ambulatory Visit | Attending: Internal Medicine | Admitting: Internal Medicine

## 2021-04-13 VITALS — BP 134/76 | HR 58 | Temp 97.6°F | Ht 67.0 in | Wt 186.0 lb

## 2021-04-13 DIAGNOSIS — N401 Enlarged prostate with lower urinary tract symptoms: Secondary | ICD-10-CM

## 2021-04-13 DIAGNOSIS — Z0000 Encounter for general adult medical examination without abnormal findings: Secondary | ICD-10-CM | POA: Diagnosis not present

## 2021-04-13 DIAGNOSIS — I25118 Atherosclerotic heart disease of native coronary artery with other forms of angina pectoris: Secondary | ICD-10-CM | POA: Diagnosis not present

## 2021-04-13 DIAGNOSIS — Z23 Encounter for immunization: Secondary | ICD-10-CM | POA: Diagnosis not present

## 2021-04-13 DIAGNOSIS — R351 Nocturia: Secondary | ICD-10-CM

## 2021-04-13 DIAGNOSIS — M19012 Primary osteoarthritis, left shoulder: Secondary | ICD-10-CM | POA: Diagnosis not present

## 2021-04-13 DIAGNOSIS — M7542 Impingement syndrome of left shoulder: Secondary | ICD-10-CM

## 2021-04-13 DIAGNOSIS — E782 Mixed hyperlipidemia: Secondary | ICD-10-CM | POA: Diagnosis not present

## 2021-04-13 DIAGNOSIS — I10 Essential (primary) hypertension: Secondary | ICD-10-CM

## 2021-04-13 DIAGNOSIS — M25712 Osteophyte, left shoulder: Secondary | ICD-10-CM | POA: Diagnosis not present

## 2021-04-13 LAB — POCT URINALYSIS DIPSTICK
Bilirubin, UA: NEGATIVE
Glucose, UA: NEGATIVE
Ketones, UA: NEGATIVE
Leukocytes, UA: NEGATIVE
Nitrite, UA: NEGATIVE
Protein, UA: NEGATIVE
Spec Grav, UA: 1.02 (ref 1.010–1.025)
Urobilinogen, UA: 0.2 E.U./dL
pH, UA: 6 (ref 5.0–8.0)

## 2021-04-13 MED ORDER — PREDNISONE 10 MG PO TABS: 10.0000 mg | ORAL_TABLET | ORAL | 0 refills | Status: AC

## 2021-04-13 NOTE — Telephone Encounter (Signed)
Called pt to let him know that his appt on Monday 9/26 is cancelled we seen pt today 9/22 he does not need to be seen on 04/17/21.  PEC nurse may give results to patient if they return call to clinic, a CRM has been created.  KP

## 2021-04-13 NOTE — Progress Notes (Signed)
Date:  04/13/2021   Name:  Todd Weaver   DOB:  October 08, 1950   MRN:  [id number]   Chief Complaint: Annual Exam and Flu Vaccine Todd Weaver is a 70 y.o. male who presents today for his Complete Annual Exam. He feels well. He reports exercising at walking 3 days a week. He reports he is sleeping well.   Colonoscopy: 01/2019  Immunization History  Administered Date(s) Administered  . Fluad Quad(high Dose 65+) 03/31/2019, 04/11/2020  . Influenza,inj,Quad PF,6+ Mos 06/19/2017, 03/28/2018  . PFIZER(Purple Top)SARS-COV-2 Vaccination 10/01/2019, 10/27/2019  . Pneumococcal Conjugate-13 03/28/2018  . Pneumococcal Polysaccharide-23 12/09/2019  . Tdap 06/19/2017    Hypertension This is a chronic problem. The problem is controlled. Pertinent negatives include no chest pain, headaches, palpitations or shortness of breath. Risk factors for coronary artery disease include dyslipidemia. Past treatments include calcium channel blockers, beta blockers and diuretics. Hypertensive end-organ damage includes CAD/MI.  Hyperlipidemia This is a chronic problem. The problem is controlled. Pertinent negatives include no chest pain, myalgias or shortness of breath. Current antihyperlipidemic treatment includes statins and ezetimibe.  Shoulder Pain  The pain is present in the left shoulder. This is a chronic problem. The problem occurs daily. The problem has been gradually worsening. The quality of the pain is described as burning and sharp. The pain is moderate.  CAD/Paroxysmal Afib - followed by cardiology.  Recent stress test was negative for ischemia.  He is on Plavix, Coreg and atorvastatin.  Lab Results  Component Value Date   CREATININE 1.01 12/05/2020   BUN 11 12/05/2020   NA 138 12/05/2020   K 4.0 12/05/2020   CL 105 12/05/2020   CO2 25 12/05/2020   Lab Results  Component Value Date   CHOL 153 04/11/2020   HDL 61 04/11/2020   LDLCALC 82 04/11/2020   LDLDIRECT 116 (H) 09/24/2017   TRIG 44  04/11/2020   CHOLHDL 2.5 04/11/2020   Lab Results  Component Value Date   TSH 1.232 07/19/2017   Lab Results  Component Value Date   HGBA1C 5.8 (H) 11/06/2017   Lab Results  Component Value Date   WBC 7.4 12/05/2020   HGB 14.9 12/05/2020   HCT 43.7 12/05/2020   MCV 81.8 12/05/2020   PLT 295 12/05/2020   Lab Results  Component Value Date   ALT 34 12/05/2020   AST 33 12/05/2020   ALKPHOS 54 12/05/2020   BILITOT 1.5 (H) 12/05/2020   Lab Results  Component Value Date   PSA1 1.5 04/11/2020   PSA1 1.2 03/31/2019   PSA1 1.1 03/28/2018      Review of Systems  Constitutional:  Negative for appetite change, chills, diaphoresis, fatigue and unexpected weight change.  HENT:  Negative for hearing loss, tinnitus, trouble swallowing and voice change.   Eyes:  Negative for visual disturbance.  Respiratory:  Negative for choking, shortness of breath and wheezing.   Cardiovascular:  Negative for chest pain, palpitations and leg swelling.  Gastrointestinal:  Negative for abdominal pain, blood in stool, constipation and diarrhea.  Genitourinary:  Negative for difficulty urinating, dysuria and frequency.  Musculoskeletal:  Positive for arthralgias (left shoulder). Negative for back pain and myalgias.  Skin:  Negative for color change and rash.  Neurological:  Negative for dizziness, syncope and headaches.  Hematological:  Negative for adenopathy.  Psychiatric/Behavioral:  Negative for dysphoric mood and sleep disturbance. The patient is not nervous/anxious.    Patient Active Problem List   Diagnosis Date Noted  . Neck  muscle spasm 12/26/2020  . Urinary hesitancy 12/26/2020  . Carpal tunnel syndrome of right wrist 12/26/2020  . Chronic systolic congestive heart failure ([place]) 09/29/2020  . Thrombophilia ([place]) 09/29/2020  . PAF (paroxysmal atrial fibrillation) ([place]) 04/11/2020  . PAD (peripheral artery disease) ([place]) 03/31/2019  . Rectal polyp   . Impingement syndrome of left  shoulder region 10/14/2018  . Coronary artery disease of native artery of native heart with stable angina pectoris ([place]) 10/03/2018  . History of ST elevation myocardial infarction (STEMI) 07/18/2017  . Essential hypertension 06/19/2017  . Mixed hyperlipidemia 06/19/2017  . History of CVA (cerebrovascular accident) 06/19/2017  . Overweight (BMI 25.0-29.9) 06/19/2017  . BPH (benign prostatic hyperplasia) 06/19/2017  . ED (erectile dysfunction) 06/19/2017  . MCI (mild cognitive impairment) 06/19/2017  . Lumbar spondylosis with myelopathy 11/08/2016  . Hand dermatitis 07/26/2014    Allergies  Allergen Reactions  . Codeine Hives  . Shellfish Allergy Hives and Swelling  . Contrast Media [Iodinated Diagnostic Agents] Itching and Other (See Comments)    Whelts on tongue  . Fish Allergy Itching    Past Surgical History:  Procedure Laterality Date  . ANAL FISTULECTOMY N/A 12/21/2014   Procedure: FISTULECTOMY ANAL;  Surgeon: Christene Lye, MD;  Location: [facility] ORS;  Service: General;  Laterality: N/A;  . APPENDECTOMY  1975  . CARDIAC CATHETERIZATION    . COLONOSCOPY WITH PROPOFOL N/A 02/12/2019   Procedure: COLONOSCOPY WITH PROPOFOL;  Surgeon: Lucilla Lame, MD;  Location: [facility] ENDOSCOPY;  Service: Endoscopy;  Laterality: N/A;  . CORONARY STENT INTERVENTION N/A 09/09/2017   Procedure: CORONARY STENT INTERVENTION;  Surgeon: Wellington Hampshire, MD;  Location: [place] CV LAB;  Service: Cardiovascular;  Laterality: N/A;  . CORONARY/GRAFT ACUTE MI REVASCULARIZATION N/A 07/18/2017   Procedure: Coronary/Graft Acute MI Revascularization;  Surgeon: Wellington Hampshire, MD;  Location: [place] [facility] LAB;  Service: Cardiovascular;  Laterality: N/A;  . HEMORRHOID SURGERY N/A 12/21/2014   Procedure: HEMORRHOIDECTOMY;  Surgeon: Christene Lye, MD;  Location: [facility] ORS;  Service: General;  Laterality: N/A;  . INTRAVASCULAR ULTRASOUND/IVUS N/A 09/09/2017   Procedure: Intravascular  Ultrasound/IVUS;  Surgeon: Wellington Hampshire, MD;  Location: [place] CV LAB;  Service: Cardiovascular;  Laterality: N/A;  . KNEE SURGERY Right age 20  . LEFT HEART CATH AND CORONARY ANGIOGRAPHY N/A 07/18/2017   Procedure: LEFT HEART CATH AND CORONARY ANGIOGRAPHY;  Surgeon: Wellington Hampshire, MD;  Location: [place] [facility] LAB;  Service: Cardiovascular;  Laterality: N/A;  . LEFT HEART CATH AND CORONARY ANGIOGRAPHY Left 08/19/2017   Procedure: LEFT HEART CATH AND CORONARY ANGIOGRAPHY;  Surgeon: Wellington Hampshire, MD;  Location: [place] CV LAB;  Service: Cardiovascular;  Laterality: Left;  . LEFT HEART CATH AND CORONARY ANGIOGRAPHY N/A 11/07/2017   Procedure: LEFT HEART CATH AND CORONARY ANGIOGRAPHY;  Surgeon: Wellington Hampshire, MD;  Location: [place] CV LAB;  Service: Cardiovascular;  Laterality: N/A;  . ROTATOR CUFF REPAIR Right 2013    Social History   Tobacco Use  . Smoking status: Former    Types: Cigarettes    Quit date: 07/24/2007    Years since quitting: 13.7  . Smokeless tobacco: Never  . Tobacco comments:    smoking cessation materials not required  Vaping Use  . Vaping Use: Never used  Substance Use Topics  . Alcohol use: Yes    Alcohol/week: 5.0 - 6.0 standard drinks    Types: 5 - 6 Cans of beer per week    Comment:  weekly  . Drug use: No     Medication list has been reviewed and updated.  Current Meds  Medication Sig  . amLODipine (NORVASC) 5 MG tablet Take 1 tablet (5 mg total) by mouth daily.  Marland Kitchen aspirin EC 81 MG tablet Take 81 mg by mouth daily.   Marland Kitchen atorvastatin (LIPITOR) 80 MG tablet TAKE 1 TABLET EVERY DAY AT 6:00PM  . carvedilol (COREG) 3.125 MG tablet Take 1 tablet (3.125 mg total) by mouth 2 (two) times daily with a meal.  . clopidogrel (PLAVIX) 75 MG tablet Take 1 tablet (75 mg total) by mouth daily.  Marland Kitchen ezetimibe (ZETIA) 10 MG tablet TAKE 1 TABLET EVERY DAY  . furosemide (LASIX) 20 MG tablet TAKE 1 TABLET (20 MG TOTAL) BY MOUTH DAILY.  .  nitroGLYCERIN (NITROSTAT) 0.4 MG SL tablet Place 1 tablet (0.4 mg total) under the tongue every 5 (five) minutes as needed for chest pain.  . potassium chloride (KLOR-CON) 10 MEQ tablet TAKE 1 TABLET (10 MEQ TOTAL) BY MOUTH DAILY.  . tamsulosin (FLOMAX) 0.4 MG CAPS capsule TAKE 1 CAPSULE (0.4 MG TOTAL) BY MOUTH DAILY.    PHQ 2/9 Scores 04/13/2021 12/26/2020 12/12/2020 09/29/2020  PHQ - 2 Score 0 0 0 0  PHQ- 9 Score 6 3 6  0    GAD 7 : Generalized Anxiety Score 04/13/2021 12/26/2020 09/29/2020 12/11/2019  Nervous, Anxious, on Edge 0 0 0 0  Control/stop worrying 0 0 0 0  Worry too much - different things 0 0 0 0  Trouble relaxing 1 1 0 0  Restless 1 1 0 0  Easily annoyed or irritable 0 0 0 0  Afraid - awful might happen 0 0 0 0  Total GAD 7 Score 2 2 0 0  Anxiety Difficulty - Not difficult at all - Not difficult at all    BP Readings from Last 3 Encounters:  04/13/21 134/76  01/05/21 140/80  01/03/21 (!) 155/83    Physical Exam Vitals and nursing note reviewed.  Constitutional:      Appearance: Normal appearance. He is well-developed.  HENT:     Head: Normocephalic.     Right Ear: Tympanic membrane, ear canal and external ear normal.     Left Ear: Tympanic membrane, ear canal and external ear normal.     Nose: Nose normal.  Eyes:     Conjunctiva/sclera: Conjunctivae normal.     Pupils: Pupils are equal, round, and reactive to light.  Neck:     Thyroid: No thyromegaly.     Vascular: No carotid bruit.  Cardiovascular:     Rate and Rhythm: Normal rate and regular rhythm.     Heart sounds: Normal heart sounds.  Pulmonary:     Effort: Pulmonary effort is normal.     Breath sounds: Normal breath sounds. No wheezing.  Chest:  Breasts:    Right: No mass.     Left: No mass.  Abdominal:     General: Bowel sounds are normal.     Palpations: Abdomen is soft.     Tenderness: There is no abdominal tenderness.  Musculoskeletal:     Right shoulder: Normal.     Left shoulder: Tenderness  present. No swelling. Decreased range of motion. Normal strength. Normal pulse.     Cervical back: Normal range of motion and neck supple.  Lymphadenopathy:     Cervical: No cervical adenopathy.  Skin:    General: Skin is warm and dry.  Neurological:     Mental Status:  He is alert and oriented to person, place, and time.     Deep Tendon Reflexes: Reflexes are normal and symmetric.  Psychiatric:        Attention and Perception: Attention normal.        Mood and Affect: Mood normal.        Thought Content: Thought content normal.    Wt Readings from Last 3 Encounters:  04/13/21 186 lb (84.4 kg)  01/05/21 185 lb 8 oz (84.1 kg)  01/03/21 184 lb (83.5 kg)    BP 134/76   Pulse (!) 58   Temp 97.6 F (36.4 C) (Oral)   Ht 5\' 7"  (1.702 m)   Wt 186 lb (84.4 kg)   SpO2 97%   BMI 29.13 kg/m   Assessment and Plan: 1. Annual physical exam Normal exam; worse on improving diet, continue exercise Up to date on screenings and immunizations - Hemoglobin A1c  2. Essential hypertension Clinically stable exam with well controlled BP since stopping losartan. Tolerating medications without side effects at this time. Pt to continue current regimen and low sodium diet; benefits of regular exercise as able discussed.  - CBC with Differential/Platelet - Comprehensive metabolic panel - POCT urinalysis dipstick  3. Benign prostatic hyperplasia with nocturia DRE deferred to Urology - PSA  4. Mixed hyperlipidemia Tolerating statin medication without side effects at this time LDL is at goal of < 70 on current dose Continue same therapy without change at this time.  - Lipid panel  5. Coronary artery disease of native artery of native heart with stable angina pectoris ([place]) Followed by Cardiology Recent normal stress test BP and lipids controlled Continue Plavix/statin/beta blocker  6. Impingement syndrome of left shoulder region - DG Shoulder Left - predniSONE (DELTASONE) 10 MG tablet;  Take 1 tablet (10 mg total) by mouth as directed for 6 days. Take 6,5,4,3,2,1 then stop  Dispense: 21 tablet; Refill: 0   Partially dictated using Editor, commissioning. Any errors are unintentional.  Halina Maidens, MD [facility]  04/13/2021

## 2021-04-14 LAB — COMPREHENSIVE METABOLIC PANEL
ALT: 22 IU/L (ref 0–44)
AST: 25 IU/L (ref 0–40)
Albumin/Globulin Ratio: 2 (ref 1.2–2.2)
Albumin: 4.8 g/dL (ref 3.8–4.8)
Alkaline Phosphatase: 71 IU/L (ref 44–121)
BUN/Creatinine Ratio: 10 (ref 10–24)
BUN: 10 mg/dL (ref 8–27)
Bilirubin Total: 1.1 mg/dL (ref 0.0–1.2)
CO2: 20 mmol/L (ref 20–29)
Calcium: 9.5 mg/dL (ref 8.6–10.2)
Chloride: 106 mmol/L (ref 96–106)
Creatinine, Ser: 1.03 mg/dL (ref 0.76–1.27)
Globulin, Total: 2.4 g/dL (ref 1.5–4.5)
Glucose: 99 mg/dL (ref 65–99)
Potassium: 4.5 mmol/L (ref 3.5–5.2)
Sodium: 143 mmol/L (ref 134–144)
Total Protein: 7.2 g/dL (ref 6.0–8.5)
eGFR: 79 mL/min/{1.73_m2} (ref >59)

## 2021-04-14 LAB — CBC WITH DIFFERENTIAL/PLATELET
Basophils Absolute: 0.1 10*3/uL (ref 0.0–0.2)
Basos: 1 %
EOS (ABSOLUTE): 0.4 10*3/uL (ref 0.0–0.4)
Eos: 6 %
Hematocrit: 46.2 % (ref 37.5–51.0)
Hemoglobin: 15 g/dL (ref 13.0–17.7)
Immature Grans (Abs): 0 10*3/uL (ref 0.0–0.1)
Immature Granulocytes: 0 %
Lymphocytes Absolute: 3.6 10*3/uL — ABNORMAL HIGH (ref 0.7–3.1)
Lymphs: 51 %
MCH: 27.9 pg (ref 26.6–33.0)
MCHC: 32.5 g/dL (ref 31.5–35.7)
MCV: 86 fL (ref 79–97)
Monocytes Absolute: 0.8 10*3/uL (ref 0.1–0.9)
Monocytes: 11 %
Neutrophils Absolute: 2.2 10*3/uL (ref 1.4–7.0)
Neutrophils: 31 %
Platelets: 248 10*3/uL (ref 150–450)
RBC: 5.37 x10E6/uL (ref 4.14–5.80)
RDW: 14.7 % (ref 11.6–15.4)
WBC: 7.1 10*3/uL (ref 3.4–10.8)

## 2021-04-14 LAB — PSA: Prostate Specific Ag, Serum: 1.8 ng/mL (ref 0.0–4.0)

## 2021-04-14 LAB — LIPID PANEL
Chol/HDL Ratio: 2.5 ratio (ref 0.0–5.0)
Cholesterol, Total: 170 mg/dL (ref 100–199)
HDL: 68 mg/dL (ref >39)
LDL Chol Calc (NIH): 92 mg/dL (ref 0–99)
Triglycerides: 50 mg/dL (ref 0–149)
VLDL Cholesterol Cal: 10 mg/dL (ref 5–40)

## 2021-04-14 LAB — HEMOGLOBIN A1C
Est. average glucose Bld gHb Est-mCnc: 126 mg/dL
Hgb A1c MFr Bld: 6 % — ABNORMAL HIGH (ref 4.8–5.6)

## 2021-04-17 ENCOUNTER — Ambulatory Visit: Payer: Medicare HMO | Admitting: Internal Medicine

## 2021-04-25 ENCOUNTER — Ambulatory Visit: Payer: Medicare HMO | Admitting: Urology

## 2021-04-25 ENCOUNTER — Encounter: Payer: Self-pay | Admitting: Urology

## 2021-04-25 ENCOUNTER — Other Ambulatory Visit: Payer: Self-pay

## 2021-04-25 VITALS — BP 175/80 | HR 62 | Ht 67.0 in | Wt 184.2 lb

## 2021-04-25 DIAGNOSIS — N3281 Overactive bladder: Secondary | ICD-10-CM | POA: Diagnosis not present

## 2021-04-25 DIAGNOSIS — N401 Enlarged prostate with lower urinary tract symptoms: Secondary | ICD-10-CM | POA: Diagnosis not present

## 2021-04-25 DIAGNOSIS — N138 Other obstructive and reflux uropathy: Secondary | ICD-10-CM

## 2021-04-25 LAB — BLADDER SCAN AMB NON-IMAGING

## 2021-04-25 MED ORDER — OXYBUTYNIN CHLORIDE ER 10 MG PO TB24: 10.0000 mg | ORAL_TABLET | Freq: Every day | ORAL | 3 refills | Status: DC

## 2021-04-25 NOTE — Progress Notes (Signed)
   04/25/2021 9:25 AM   [name] February 10, 1951 [id number]  Reason for visit: Follow up overactive bladder, nocturia, BPH  HPI: 70 year old male with extensive cardiac history who I originally saw in June 2022 for urinary urgency, frequency, nocturia 3-4 times per night.  IPSS score was 22 with quality-of-life dissatisfied.  PVR was normal.  PSA was normal at 1.8.  Urinalysis was benign.  He takes his Lasix in the morning.  We discussed behavioral strategies at that time including decreasing alcohol intake(3-4 beers per day), and decreasing his fluid intake overnight and before bed.  He has noticed only mild improvement in his urinary symptoms with these changes, and is interested in other options.  PVR 0 mL today.  We discussed possible etiologies including overactive bladder, BPH, excessive urine output overnight, sleep apnea, and fluid intake related.  I recommended a trial of oxybutynin 10 mg daily, and we again reviewed behavioral strategies extensively.  Could consider desmopressin in the future if no improvement on oxybutynin.   Trial of oxybutynin 10 mg XL daily RTC 6 to 8 weeks PVR and symptom check.  If doing well discontinue Flomax at that time   [place], [place] [address] [place], [place] [address] [phone]

## 2021-04-25 NOTE — Patient Instructions (Signed)

## 2021-05-15 ENCOUNTER — Other Ambulatory Visit: Payer: Self-pay | Admitting: Internal Medicine

## 2021-05-15 NOTE — Telephone Encounter (Signed)
Requested Prescriptions  Pending Prescriptions Disp Refills  . potassium chloride (KLOR-CON) 10 MEQ tablet [Pharmacy Med Name: POTASSIUM CHLORIDE ER 10 MEQ Tablet Extended Release] 90 tablet 3    Sig: TAKE 1 TABLET (10 MEQ TOTAL) BY MOUTH DAILY.     Endocrinology:  Minerals - Potassium Supplementation Passed - 05/15/2021  3:09 PM      Passed - K in normal range and within 360 days    Potassium  Date Value Ref Range Status  04/13/2021 4.5 3.5 - 5.2 mmol/L Final         Passed - Cr in normal range and within 360 days    Creatinine, Ser  Date Value Ref Range Status  04/13/2021 1.03 0.76 - 1.27 mg/dL Final         Passed - Valid encounter within last 12 months    Recent Outpatient Visits          1 month ago Annual physical exam   [facility] Glean Hess, MD   4 months ago Essential hypertension   [place], MD   7 months ago Essential hypertension   [facility] Glean Hess, MD   1 year ago Annual physical exam   [facility] Glean Hess, MD   1 year ago Lateral epicondylitis of left elbow   [facility] Glean Hess, MD      Future Appointments            In 52 months Army Melia Jesse Sans, MD [facility], [place]           . clopidogrel (PLAVIX) 75 MG tablet [Pharmacy Med Name: CLOPIDOGREL 75 MG Tablet] 90 tablet 0    Sig: TAKE 1 TABLET EVERY DAY     Hematology: Antiplatelets - clopidogrel Failed - 05/15/2021  3:09 PM      Failed - Evaluate AST, ALT within 2 months of therapy initiation.      Passed - ALT in normal range and within 360 days    ALT  Date Value Ref Range Status  04/13/2021 22 0 - 44 IU/L Final         Passed - AST in normal range and within 360 days    AST  Date Value Ref Range Status  04/13/2021 25 0 - 40 IU/L Final         Passed - HCT in normal range and within 180 days    Hematocrit  Date Value Ref Range Status  04/13/2021 46.2 37.5 - 51.0 %  Final         Passed - HGB in normal range and within 180 days    Hemoglobin  Date Value Ref Range Status  04/13/2021 15.0 13.0 - 17.7 g/dL Final         Passed - PLT in normal range and within 180 days    Platelets  Date Value Ref Range Status  04/13/2021 248 150 - 450 x10E3/uL Final         Passed - Valid encounter within last 6 months    Recent Outpatient Visits          1 month ago Annual physical exam   [facility] Glean Hess, MD   4 months ago Essential hypertension   [place], MD   7 months ago Essential hypertension   [facility] Glean Hess, MD   1 year ago Annual physical exam  [facility] Glean Hess, MD   1 year ago Lateral epicondylitis of left elbow   [facility] Glean Hess, MD      Future Appointments            In 11 months Army Melia Jesse Sans, MD [facility], [facility]

## 2021-05-15 NOTE — Telephone Encounter (Signed)
Requested Prescriptions  Pending Prescriptions Disp Refills  . clopidogrel (PLAVIX) 75 MG tablet [Pharmacy Med Name: CLOPIDOGREL 75 MG Tablet] 90 tablet 0    Sig: TAKE 1 TABLET EVERY DAY     Hematology: Antiplatelets - clopidogrel Failed - 05/15/2021  3:09 PM      Failed - Evaluate AST, ALT within 2 months of therapy initiation.      Passed - ALT in normal range and within 360 days    ALT  Date Value Ref Range Status  04/13/2021 22 0 - 44 IU/L Final         Passed - AST in normal range and within 360 days    AST  Date Value Ref Range Status  04/13/2021 25 0 - 40 IU/L Final         Passed - HCT in normal range and within 180 days    Hematocrit  Date Value Ref Range Status  04/13/2021 46.2 37.5 - 51.0 % Final         Passed - HGB in normal range and within 180 days    Hemoglobin  Date Value Ref Range Status  04/13/2021 15.0 13.0 - 17.7 g/dL Final         Passed - PLT in normal range and within 180 days    Platelets  Date Value Ref Range Status  04/13/2021 248 150 - 450 x10E3/uL Final         Passed - Valid encounter within last 6 months    Recent Outpatient Visits          1 month ago Annual physical exam   [facility] Glean Hess, MD   4 months ago Essential hypertension   [facility] Glean Hess, MD   7 months ago Essential hypertension   [place], Laura H, MD   1 year ago Annual physical exam   [facility] Glean Hess, MD   1 year ago Lateral epicondylitis of left elbow   [facility] Glean Hess, MD      Future Appointments            In 11 months Glean Hess, MD [facility], [facility]           Signed Prescriptions Disp Refills   potassium chloride (KLOR-CON) 10 MEQ tablet 90 tablet 3    Sig: TAKE 1 TABLET (10 MEQ TOTAL) BY MOUTH DAILY.     Endocrinology:  Minerals - Potassium Supplementation Passed - 05/15/2021  3:09 PM      Passed - K in normal range  and within 360 days    Potassium  Date Value Ref Range Status  04/13/2021 4.5 3.5 - 5.2 mmol/L Final         Passed - Cr in normal range and within 360 days    Creatinine, Ser  Date Value Ref Range Status  04/13/2021 1.03 0.76 - 1.27 mg/dL Final         Passed - Valid encounter within last 12 months    Recent Outpatient Visits          1 month ago Annual physical exam   [facility] Glean Hess, MD   4 months ago Essential hypertension   [place], MD   7 months ago Essential hypertension   [facility] Glean Hess, MD   1 year ago Annual physical exam   [place],  Jesse Sans, MD   1 year ago Lateral epicondylitis of left elbow   [facility] Glean Hess, MD      Future Appointments            In 11 months Army Melia Jesse Sans, MD [facility]

## 2021-05-18 ENCOUNTER — Other Ambulatory Visit: Payer: Self-pay | Admitting: Internal Medicine

## 2021-05-19 NOTE — Telephone Encounter (Signed)
Change in pharmacy- Rx forwarded to requested pharmacy  Requested Prescriptions  Pending Prescriptions Disp Refills  . clopidogrel (PLAVIX) 75 MG tablet [Pharmacy Med Name: CLOPIDOGREL 75MG  TABLETS] 90 tablet 0    Sig: TAKE 1 TABLET(75 MG) BY MOUTH DAILY     Hematology: Antiplatelets - clopidogrel Failed - 05/18/2021 10:21 AM      Failed - Evaluate AST, ALT within 2 months of therapy initiation.      Passed - ALT in normal range and within 360 days    ALT  Date Value Ref Range Status  04/13/2021 22 0 - 44 IU/L Final         Passed - AST in normal range and within 360 days    AST  Date Value Ref Range Status  04/13/2021 25 0 - 40 IU/L Final         Passed - HCT in normal range and within 180 days    Hematocrit  Date Value Ref Range Status  04/13/2021 46.2 37.5 - 51.0 % Final         Passed - HGB in normal range and within 180 days    Hemoglobin  Date Value Ref Range Status  04/13/2021 15.0 13.0 - 17.7 g/dL Final         Passed - PLT in normal range and within 180 days    Platelets  Date Value Ref Range Status  04/13/2021 248 150 - 450 x10E3/uL Final         Passed - Valid encounter within last 6 months    Recent Outpatient Visits          1 month ago Annual physical exam   [facility] Glean Hess, MD   4 months ago Essential hypertension   [place], Laura H, MD   7 months ago Essential hypertension   [place], Laura H, MD   1 year ago Annual physical exam   [facility] Glean Hess, MD   1 year ago Lateral epicondylitis of left elbow   [facility] Glean Hess, MD      Future Appointments            In 64 months Army Melia Jesse Sans, MD [facility], [facility]

## 2021-06-20 ENCOUNTER — Ambulatory Visit: Payer: Medicare HMO | Admitting: Urology

## 2021-06-20 ENCOUNTER — Encounter: Payer: Self-pay | Admitting: Urology

## 2021-06-20 ENCOUNTER — Other Ambulatory Visit: Payer: Self-pay

## 2021-06-20 VITALS — BP 145/78 | HR 58 | Ht 67.0 in | Wt 182.0 lb

## 2021-06-20 DIAGNOSIS — N3281 Overactive bladder: Secondary | ICD-10-CM

## 2021-06-20 DIAGNOSIS — R351 Nocturia: Secondary | ICD-10-CM | POA: Diagnosis not present

## 2021-06-20 DIAGNOSIS — Z125 Encounter for screening for malignant neoplasm of prostate: Secondary | ICD-10-CM

## 2021-06-20 DIAGNOSIS — N138 Other obstructive and reflux uropathy: Secondary | ICD-10-CM | POA: Diagnosis not present

## 2021-06-20 DIAGNOSIS — N401 Enlarged prostate with lower urinary tract symptoms: Secondary | ICD-10-CM | POA: Diagnosis not present

## 2021-06-20 LAB — BLADDER SCAN AMB NON-IMAGING

## 2021-06-20 NOTE — Patient Instructions (Signed)
Prostate Cancer Screening Prostate cancer screening is testing that is done to check for the presence of prostate cancer in men. The prostate gland is a walnut-sized gland that is located below the bladder and in front of the rectum in males. The function of the prostate is to add fluid to semen during ejaculation. Prostate cancer is one of the most common types of cancer in men. Who should have prostate cancer screening? Screening recommendations vary based on age and other risk factors, as well as between the professional organizations who make the recommendations. In general, screening is recommended if: You are age 97 to 47 and have an average risk for prostate cancer. You should talk with your health care provider about your need for screening and how often screening should be done. Because most prostate cancers are slow growing and will not cause death, screening in this age group is generally reserved for men who have a 16- to 15-year life expectancy. You are younger than age 91, and you have these risk factors: Having a father, brother, or uncle who has been diagnosed with prostate cancer. The risk is higher if your family member's cancer occurred at an early age or if you have multiple family members with prostate cancer at an early age. Being a male who is [place] or is of [place] or sub-Saharan African descent. In general, screening is not recommended if: You are younger than age 55. You are between the ages of 47 and 47 and you have no risk factors. You are 21 years of age or older. At this age, the risks that screening can cause are greater than the benefits that it may provide. If you are at high risk for prostate cancer, your health care provider may recommend that you have screenings more often or that you start screening at a younger age. How is screening for prostate cancer done? The recommended prostate cancer screening test is a blood test called the prostate-specific antigen (PSA)  test. PSA is a protein that is made in the prostate. As you age, your prostate naturally produces more PSA. Abnormally high PSA levels may be caused by: Prostate cancer. An enlarged prostate that is not caused by cancer (benign prostatic hyperplasia, or BPH). This condition is very common in older men. A prostate gland infection (prostatitis) or urinary tract infection. Certain medicines such as male hormones (like testosterone) or other medicines that raise testosterone levels. A rectal exam may be done as part of prostate cancer screening to help provide information about the size of your prostate gland. When a rectal exam is performed, it should be done after the PSA level is drawn to avoid any effect on the results. Depending on the PSA results, you may need more tests, such as: A physical exam to check the size of your prostate gland, if not done as part of screening. Blood and imaging tests. A procedure to remove tissue samples from your prostate gland for testing (biopsy). This is the only way to know for certain if you have prostate cancer. What are the benefits of prostate cancer screening? Screening can help to identify cancer at an early stage, before symptoms start and when the cancer can be treated more easily. There is a small chance that screening may lower your risk of dying from prostate cancer. The chance is small because prostate cancer is a slow-growing cancer, and most men with prostate cancer die from a different cause. What are the risks of prostate cancer screening? The main  risk of prostate cancer screening is diagnosing and treating prostate cancer that would never have caused any symptoms or problems. This is called overdiagnosisand overtreatment. PSA screening cannot tell you if your PSA is high due to cancer or a different cause. A prostate biopsy is the only procedure to diagnose prostate cancer. Even the results of a biopsy may not tell you if your cancer needs to be  treated. Slow-growing prostate cancer may not need any treatment other than monitoring, so diagnosing and treating it may cause unnecessary stress or other side effects. Questions to ask your health care provider When should I start prostate cancer screening? What is my risk for prostate cancer? How often do I need screening? What type of screening tests do I need? How do I get my test results? What do my results mean? Do I need treatment? Where to find more information The American Cancer Society: www.cancer.org American Urological Association: www.auanet.org Contact a health care provider if: You have difficulty urinating. You have pain when you urinate or ejaculate. You have blood in your urine or semen. You have pain in your back or in the area of your prostate. Summary Prostate cancer is a common type of cancer in men. The prostate gland is located below the bladder and in front of the rectum. This gland adds fluid to semen during ejaculation. Prostate cancer screening may identify cancer at an early stage, when the cancer can be treated more easily and is less likely to have spread to other areas of the body. The prostate-specific antigen (PSA) test is the recommended screening test for prostate cancer, but it has associated risks. Discuss the risks and benefits of prostate cancer screening with your health care provider. If you are age 31 or older, the risks that screening can cause are greater than the benefits that it may provide. This information is not intended to replace advice given to you by your health care provider. Make sure you discuss any questions you have with your health care provider. Document Revised: 01/02/2021 Document Reviewed: 01/02/2021 Elsevier Patient Education  [place].  Urinary Frequency, Adult Urinary frequency means urinating more often than usual. You may urinate every 1-2 hours even though you drink a normal amount of fluid and do not have a  bladder infection or condition. Although you urinate more often than normal, the total amount of urine produced in a day is normal. With urinary frequency, you may have an urgent need to urinate often. The stress and anxiety of needing to find a bathroom quickly can make this urge worse. This condition may go away on its own, or you may need treatment at home. Home treatment may include bladder training, exercises, taking medicines, or making changes to your diet. Follow these instructions at home: Bladder health Your health care provider will tell you what to do to improve bladder health. You may be told to: Keep a bladder diary. Keep track of: What you eat and drink. How often you urinate. How much you urinate. Follow a bladder training program. This may include: Learning to delay going to the bathroom. Double urinating, also called voiding. This helps if you are not completely emptying your bladder. Scheduled voiding. Do Kegel exercises. Kegel exercises strengthen the muscles that help control urination, which may help the condition.  Eating and drinking Follow instructions from your health care provider about eating or drinking restrictions. You may be told to: Avoid caffeine. Drink fewer fluids, especially alcohol. Avoid drinking in the evening.  Avoid foods or drinks that may irritate the bladder. These include coffee, tea, soda, artificial sweeteners, citrus, tomato-based foods, and chocolate. Eat foods that help prevent or treat constipation. Constipation can make urinary frequency worse. You may need to take these actions to prevent or treat constipation: Drink enough fluid to keep your urine pale yellow. Take over-the-counter or prescription medicines. Eat foods that are high in fiber, such as beans, whole grains, and fresh fruits and vegetables. Limit foods that are high in fat and processed sugars, such as fried or sweet foods. General instructions Take over-the-counter and  prescription medicines only as told by your health care provider. Keep all follow-up visits. This is important. Contact a health care provider if: You start urinating more often. You feel pain or irritation when you urinate. You notice blood in your urine. Your urine looks cloudy. You develop a fever. You begin vomiting. Get help right away if: You are unable to urinate. Summary Urinary frequency means urinating more often than usual. With urinary frequency, you may urinate every 1-2 hours even though you drink a normal amount of fluid and do not have a bladder infection or other bladder condition. Your health care provider may recommend that you keep a bladder diary, follow a bladder training program, or make dietary changes. If told by your health care provider, do Kegel exercises to strengthen the muscles that help control urination. Take over-the-counter and prescription medicines only as told by your health care provider. Contact a health care provider if your symptoms do not improve or get worse. This information is not intended to replace advice given to you by your health care provider. Make sure you discuss any questions you have with your health care provider. Document Revised: 02/12/2020 Document Reviewed: 02/12/2020 Elsevier Patient Education  [place].

## 2021-06-20 NOTE — Progress Notes (Signed)
   06/20/2021 8:51 AM   Todd Weaver [birth year] [id number]  Reason for visit: Follow up overactive bladder, nocturia, BPH, PSA screening  HPI: 70 year old male with extensive cardiac history and primary urinary complaints of frequency, urgency, nocturia 3-4 times per night.  PVRs have been normal.  PSA is normal at 1.8, and urinalysis was benign.  He takes his Lasix in the morning.  At our last visit, we started oxybutynin 10 mg XL daily in addition to his baseline Flomax.  He feels this is improved his overactive symptoms, as well as his nocturia from 3-4 times at night to twice overnight.  He also continues to drink 2-3 beers in the evening.  We had a long discussion about his urinary symptoms, and that his alcohol intake in the evening is likely a major contributor to his nocturia.  I recommended continuing the oxybutynin and Flomax since these have improved his urinary symptoms.  Regarding PSA screening, reassurance was provided that his PSA is remained within the normal range, and we reviewed the AUA guidelines that recommend screening every 1 to 2 years up to age 55.  -Continue Flomax and oxybutynin -RTC 6 months symptom check, consider cystoscopy in the future if worsening symptoms -Continue PSA screening every 1 to 2 years through age 51  Billey Co, MD  [facility] [address] [place], [place] [address] [phone]

## 2021-06-28 ENCOUNTER — Other Ambulatory Visit: Payer: Self-pay | Admitting: Internal Medicine

## 2021-06-28 NOTE — Telephone Encounter (Signed)
Requested medication (s) are due for refill today: expired medications flomax, lipitor  Requested medication (s) are on the active medication list: yes  Last refill:  lasix- 09/26/20 #90 0 refills, flomax - 04/25/20 #90 3 refills, lipitor- 04/25/20 #90 3 refills   Future visit scheduled: yes in 9 months  Notes to clinic:  expired medications. Flomax , lipitor .  Do you want to refill lasix Rx?     Requested Prescriptions  Pending Prescriptions Disp Refills   furosemide (LASIX) 20 MG tablet [Pharmacy Med Name: FUROSEMIDE 20 MG Tablet] 90 tablet 0    Sig: TAKE 1 TABLET EVERY DAY     Cardiovascular:  Diuretics - Loop Failed - 06/28/2021 11:08 AM      Failed - Last BP in normal range    BP Readings from Last 1 Encounters:  06/20/21 (!) 145/78          Passed - K in normal range and within 360 days    Potassium  Date Value Ref Range Status  04/13/2021 4.5 3.5 - 5.2 mmol/L Final          Passed - Ca in normal range and within 360 days    Calcium  Date Value Ref Range Status  04/13/2021 9.5 8.6 - 10.2 mg/dL Final          Passed - Na in normal range and within 360 days    Sodium  Date Value Ref Range Status  04/13/2021 143 134 - 144 mmol/L Final          Passed - Cr in normal range and within 360 days    Creatinine, Ser  Date Value Ref Range Status  04/13/2021 1.03 0.76 - 1.27 mg/dL Final          Passed - Valid encounter within last 6 months    Recent Outpatient Visits           2 months ago Annual physical exam   [facility] Glean Hess, MD   6 months ago Essential hypertension   [place], [place]   9 months ago Essential hypertension   [facility] Glean Hess, MD   1 year ago Annual physical exam   [facility] Glean Hess, MD   1 year ago Lateral epicondylitis of left elbow   [facility] Glean Hess, MD       Future Appointments             In 9 months Glean Hess, MD [facility]             tamsulosin (FLOMAX) 0.4 MG CAPS capsule [Pharmacy Med Name: TAMSULOSIN HYDROCHLORIDE 0.4 MG Capsule] 90 capsule 3    Sig: TAKE 1 CAPSULE (0.4 MG TOTAL) BY MOUTH DAILY.     Urology: Alpha-Adrenergic Blocker Failed - 06/28/2021 11:08 AM      Failed - Last BP in normal range    BP Readings from Last 1 Encounters:  06/20/21 (!) 145/78          Passed - Valid encounter within last 12 months    Recent Outpatient Visits           2 months ago Annual physical exam   [facility] Glean Hess, MD   6 months ago Essential hypertension   [place], MD   9 months ago Essential hypertension   [place],  Jesse Sans, MD   1 year ago Annual physical exam   [facility] Glean Hess, MD   1 year ago Lateral epicondylitis of left elbow   [facility] Glean Hess, MD       Future Appointments             In 9 months [facility] Mel[facility] Jesse Sans, MD [facility]             atorvastatin (LIPITOR) 80 MG tablet [Pharmacy Med Name: ATORVASTATIN CALCIUM 80 MG Tablet] 90 tablet 3    Sig: TAKE 1 TABLET EVERY DAY AT 6:00PM     Cardiovascular:  Antilipid - Statins Passed - 06/28/2021 11:08 AM      Passed - Total Cholesterol in normal range and within 360 days    Cholesterol, Total  Date Value Ref Range Status  04/13/2021 170 100 - 199 mg/dL Final          Passed - LDL in normal range and within 360 days    LDL Chol Calc (NIH)  Date Value Ref Range Status  04/13/2021 92 0 - 99 mg/dL Final   LDL Direct  Date Value Ref Range Status  09/24/2017 116 (H) 0 - 99 mg/dL Final          Passed - HDL in normal range and within 360 days    HDL  Date Value Ref Range Status  04/13/2021 68 >39 mg/dL Final          Passed - Triglycerides in normal range and within 360 days    Triglycerides  Date Value Ref Range Status  04/13/2021 50 0 - 149  mg/dL Final          Passed - Patient is not pregnant      Passed - Valid encounter within last 12 months    Recent Outpatient Visits           2 months ago Annual physical exam   [facility] Glean Hess, MD   6 months ago Essential hypertension   [facility] Glean Hess, MD   9 months ago Essential hypertension   [facility] Glean Hess, MD   1 year ago Annual physical exam   [facility] Glean Hess, MD   1 year ago Lateral epicondylitis of left elbow   [facility] Glean Hess, MD       Future Appointments             In 9 months [facility] Melia Jesse Sans, MD [facility]

## 2021-08-18 ENCOUNTER — Other Ambulatory Visit: Payer: Self-pay | Admitting: *Deleted

## 2021-08-18 MED ORDER — OXYBUTYNIN CHLORIDE ER 10 MG PO TB24: 10.0000 mg | ORAL_TABLET | Freq: Every day | ORAL | 3 refills | Status: DC

## 2021-08-23 ENCOUNTER — Other Ambulatory Visit: Payer: Self-pay

## 2021-08-23 DIAGNOSIS — N3281 Overactive bladder: Secondary | ICD-10-CM

## 2021-08-23 MED ORDER — OXYBUTYNIN CHLORIDE ER 10 MG PO TB24: 10.0000 mg | ORAL_TABLET | Freq: Every day | ORAL | 3 refills | Status: DC

## 2021-09-05 ENCOUNTER — Ambulatory Visit: Payer: Self-pay | Admitting: *Deleted

## 2021-09-05 NOTE — Telephone Encounter (Signed)
Noted pt has an appointment tomorrow 09/06/2020.  KP

## 2021-09-05 NOTE — Telephone Encounter (Signed)
°  Chief Complaint: Cough, SOB Symptoms: mild SOB with exertion, productive cough, greenish Frequency: onset 7 days ago Pertinent Negatives: Patient denies sinus pain, covid neg home test. No fever, sore throat or body aches Disposition: [] ED /[] Urgent Care (no appt availability in office) / [x] Appointment(In office/virtual)/ []  [facility] [facility]/ [] Home Care/ [] Refused Recommended Disposition /[] [facility] [place] [facility]/ []  Follow-up with PCP Additional Notes: Appt for tomorrow     Reason for Disposition  [1] MILD difficulty breathing (e.g., minimal/no SOB at rest, SOB with walking, pulse <100) AND [2] NEW-onset or WORSE than normal    With productive cough only  Answer Assessment - Initial Assessment Questions 1. RESPIRATORY STATUS: "Describe your breathing?" (e.g., wheezing, shortness of breath, unable to speak, severe coughing)      Mid SOB, chest tightness 2. ONSET: "When did this breathing problem begin?"      5 days ago 3. PATTERN "Does the difficult breathing come and go, or has it been constant since it started?"      With exertion 4. SEVERITY: "How bad is your breathing?" (e.g., mild, moderate, severe)    - MILD: No SOB at rest, mild SOB with walking, speaks normally in sentences, can lie down, no retractions, pulse < 100.    - MODERATE: SOB at rest, SOB with minimal exertion and prefers to sit, cannot lie down flat, speaks in phrases, mild retractions, audible wheezing, pulse 100-120.    - SEVERE: Very SOB at rest, speaks in single words, struggling to breathe, sitting hunched forward, retractions, pulse > 120      mild 5. RECURRENT SYMPTOM: "Have you had difficulty breathing before?" If Yes, ask: "When was the last time?" and "What happened that time?"      No 6. CARDIAC HISTORY: "Do you have any history of heart disease?" (e.g., heart attack, angina, bypass surgery, angioplasty)       7. LUNG HISTORY: "Do you have any history of lung disease?"  (e.g., pulmonary embolus,  asthma, emphysema)      8. CAUSE: "What do you think is causing the breathing problem?"      Unsure 9. OTHER SYMPTOMS: "Do you have any other symptoms? (e.g., dizziness, runny nose, cough, chest pain, fever)     Cough, productive greenish,7 days onset. Neg covid home test 2 days ago  Protocols used: Breathing Difficulty-A-AH

## 2021-09-06 ENCOUNTER — Encounter: Payer: Self-pay | Admitting: Internal Medicine

## 2021-09-06 ENCOUNTER — Other Ambulatory Visit: Payer: Self-pay

## 2021-09-06 ENCOUNTER — Ambulatory Visit (INDEPENDENT_AMBULATORY_CARE_PROVIDER_SITE_OTHER): Payer: Medicare HMO | Admitting: Internal Medicine

## 2021-09-06 VITALS — BP 150/74 | HR 58 | Temp 97.8°F | Ht 67.0 in | Wt 183.0 lb

## 2021-09-06 DIAGNOSIS — J40 Bronchitis, not specified as acute or chronic: Secondary | ICD-10-CM | POA: Diagnosis not present

## 2021-09-06 MED ORDER — PROMETHAZINE-DM 6.25-15 MG/5ML PO SYRP: 5.0000 mL | ORAL_SOLUTION | Freq: Four times a day (QID) | ORAL | 0 refills | Status: DC | PRN

## 2021-09-06 NOTE — Progress Notes (Signed)
Date:  09/06/2021   Name:  Todd Weaver   DOB:  31-Dec-1950   MRN:  [id number]   Chief Complaint: Cough  Cough This is a new problem. The current episode started in the past 7 days. The problem has been waxing and waning. The problem occurs every few minutes. The cough is Productive of sputum (Green in color). Associated symptoms include chest pain, nasal congestion, postnasal drip, a sore throat and shortness of breath. Pertinent negatives include no ear pain, fever or headaches. Patient tested himself for Covid twice since this begun and both tests were negative.  Lab Results  Component Value Date   NA 143 04/13/2021   K 4.5 04/13/2021   CO2 20 04/13/2021   GLUCOSE 99 04/13/2021   BUN 10 04/13/2021   CREATININE 1.03 04/13/2021   CALCIUM 9.5 04/13/2021   EGFR 79 04/13/2021   GFRNONAA >60 12/05/2020   Lab Results  Component Value Date   CHOL 170 04/13/2021   HDL 68 04/13/2021   LDLCALC 92 04/13/2021   LDLDIRECT 116 (H) 09/24/2017   TRIG 50 04/13/2021   CHOLHDL 2.5 04/13/2021   Lab Results  Component Value Date   TSH 1.232 07/19/2017   Lab Results  Component Value Date   HGBA1C 6.0 (H) 04/13/2021   Lab Results  Component Value Date   WBC 7.1 04/13/2021   HGB 15.0 04/13/2021   HCT 46.2 04/13/2021   MCV 86 04/13/2021   PLT 248 04/13/2021   Lab Results  Component Value Date   ALT 22 04/13/2021   AST 25 04/13/2021   ALKPHOS 71 04/13/2021   BILITOT 1.1 04/13/2021   No results found for: 25OHVITD2, 25OHVITD3, VD25OH   Review of Systems  Constitutional:  Negative for diaphoresis and fever.  HENT:  Positive for postnasal drip and sore throat. Negative for ear pain.   Respiratory:  Positive for cough and shortness of breath.   Cardiovascular:  Positive for chest pain.  Neurological:  Negative for headaches.   Patient Active Problem List   Diagnosis Date Noted   Neck muscle spasm 12/26/2020   Urinary hesitancy 12/26/2020   Carpal tunnel syndrome of right  wrist 02/58/5277   Chronic systolic congestive heart failure ([place]) 09/29/2020   Thrombophilia ([place]) 09/29/2020   PAF (paroxysmal atrial fibrillation) ([place]) 04/11/2020   PAD (peripheral artery disease) ([place]) 03/31/2019   Rectal polyp    Impingement syndrome of left shoulder region 10/14/2018   Coronary artery disease of native artery of native heart with stable angina pectoris ([place]) 10/03/2018   History of ST elevation myocardial infarction (STEMI) 07/18/2017   Essential hypertension 06/19/2017   Mixed hyperlipidemia 06/19/2017   History of CVA (cerebrovascular accident) 06/19/2017   Overweight (BMI 25.0-29.9) 06/19/2017   BPH (benign prostatic hyperplasia) 06/19/2017   ED (erectile dysfunction) 06/19/2017   MCI (mild cognitive impairment) 06/19/2017   Lumbar spondylosis with myelopathy 11/08/2016   Hand dermatitis 07/26/2014    Allergies  Allergen Reactions   Codeine Hives   Shellfish Allergy Hives and Swelling   Contrast Media [Iodinated Contrast Media] Itching and Other (See Comments)    Whelts on tongue   Fish Allergy Itching    Past Surgical History:  Procedure Laterality Date   ANAL FISTULECTOMY N/A 12/21/2014   Procedure: FISTULECTOMY ANAL;  Surgeon: Christene Lye, MD;  Location: [facility] ORS;  Service: General;  Laterality: N/A;   APPENDECTOMY  1975   CARDIAC CATHETERIZATION     COLONOSCOPY WITH PROPOFOL N/A 02/12/2019  Procedure: COLONOSCOPY WITH PROPOFOL;  Surgeon: Lucilla Lame, MD;  Location: [facility] ENDOSCOPY;  Service: Endoscopy;  Laterality: N/A;   CORONARY STENT INTERVENTION N/A 09/09/2017   Procedure: CORONARY STENT INTERVENTION;  Surgeon: Wellington Hampshire, MD;  Location: [place] CV LAB;  Service: Cardiovascular;  Laterality: N/A;   CORONARY/GRAFT ACUTE MI REVASCULARIZATION N/A 07/18/2017   Procedure: Coronary/Graft Acute MI Revascularization;  Surgeon: Wellington Hampshire, MD;  Location: [place] CV LAB;  Service: Cardiovascular;  Laterality: N/A;    HEMORRHOID SURGERY N/A 12/21/2014   Procedure: HEMORRHOIDECTOMY;  Surgeon: Christene Lye, MD;  Location: [facility] ORS;  Service: General;  Laterality: N/A;   INTRAVASCULAR ULTRASOUND/IVUS N/A 09/09/2017   Procedure: Intravascular Ultrasound/IVUS;  Surgeon: Wellington Hampshire, MD;  Location: [place] CV LAB;  Service: Cardiovascular;  Laterality: N/A;   KNEE SURGERY Right age 31   LEFT HEART CATH AND CORONARY ANGIOGRAPHY N/A 07/18/2017   Procedure: LEFT HEART CATH AND CORONARY ANGIOGRAPHY;  Surgeon: Wellington Hampshire, MD;  Location: [place] CV LAB;  Service: Cardiovascular;  Laterality: N/A;   LEFT HEART CATH AND CORONARY ANGIOGRAPHY Left 08/19/2017   Procedure: LEFT HEART CATH AND CORONARY ANGIOGRAPHY;  Surgeon: Wellington Hampshire, MD;  Location: [place] [facility] LAB;  Service: Cardiovascular;  Laterality: Left;   LEFT HEART CATH AND CORONARY ANGIOGRAPHY N/A 11/07/2017   Procedure: LEFT HEART CATH AND CORONARY ANGIOGRAPHY;  Surgeon: Wellington Hampshire, MD;  Location: [place] CV LAB;  Service: Cardiovascular;  Laterality: N/A;   ROTATOR CUFF REPAIR Right 2013    Social History   Tobacco Use   Smoking status: Former    Types: Cigarettes    Quit date: 07/24/2007    Years since quitting: 14.1   Smokeless tobacco: Never   Tobacco comments:    smoking cessation materials not required  Vaping Use   Vaping Use: Never used  Substance Use Topics   Alcohol use: Yes    Alcohol/week: 5.0 - 6.0 standard drinks    Types: 5 - 6 Cans of beer per week    Comment:  weekly   Drug use: No     Medication list has been reviewed and updated.  Current Meds  Medication Sig   amLODipine (NORVASC) 5 MG tablet Take 1 tablet (5 mg total) by mouth daily.   amoxicillin (AMOXIL) 875 MG tablet Take 875 mg by mouth 2 (two) times daily.   aspirin EC 81 MG tablet Take 81 mg by mouth daily.    atorvastatin (LIPITOR) 80 MG tablet TAKE 1 TABLET EVERY DAY AT 6:00PM   carvedilol (COREG) 3.125 MG tablet  Take 1 tablet (3.125 mg total) by mouth 2 (two) times daily with a meal.   clopidogrel (PLAVIX) 75 MG tablet TAKE 1 TABLET(75 MG) BY MOUTH DAILY   ezetimibe (ZETIA) 10 MG tablet TAKE 1 TABLET EVERY DAY   furosemide (LASIX) 20 MG tablet TAKE 1 TABLET EVERY DAY   nitroGLYCERIN (NITROSTAT) 0.4 MG SL tablet Place 1 tablet (0.4 mg total) under the tongue every 5 (five) minutes as needed for chest pain.   oxybutynin (DITROPAN-XL) 10 MG 24 hr tablet Take 1 tablet (10 mg total) by mouth at bedtime.   potassium chloride (KLOR-CON) 10 MEQ tablet TAKE 1 TABLET (10 MEQ TOTAL) BY MOUTH DAILY.   promethazine-dextromethorphan (PROMETHAZINE-DM) 6.25-15 MG/5ML syrup Take 5 mLs by mouth 4 (four) times daily as needed for cough.   sildenafil (REVATIO) 20 MG tablet Take 1 tablet (20 mg total) by mouth 3 (three)  times daily as needed.   tamsulosin (FLOMAX) 0.4 MG CAPS capsule TAKE 1 CAPSULE (0.4 MG TOTAL) BY MOUTH DAILY.    PHQ 2/9 Scores 09/06/2021 04/13/2021 12/26/2020 12/12/2020  PHQ - 2 Score 0 0 0 0  PHQ- 9 Score $Remov'3 6 3 6    'kBszhw$ GAD 7 : Generalized Anxiety Score 09/06/2021 04/13/2021 12/26/2020 09/29/2020  Nervous, Anxious, on Edge 0 0 0 0  Control/stop worrying 0 0 0 0  Worry too much - different things 0 0 0 0  Trouble relaxing $RemoveBeforeDE'1 1 1 'ZwEwzJvxrjUGmkr$ 0  Restless 0 1 1 0  Easily annoyed or irritable 0 0 0 0  Afraid - awful might happen 0 0 0 0  Total GAD 7 Score $Remov'1 2 2 'SEzAST$ 0  Anxiety Difficulty Not difficult at all - Not difficult at all -    BP Readings from Last 3 Encounters:  09/06/21 (!) 150/74  06/20/21 (!) 145/78  04/25/21 (!) 175/80    Physical Exam Vitals and nursing note reviewed.  Constitutional:      General: He is not in acute distress.    Appearance: Normal appearance. He is well-developed.  HENT:     Head: Normocephalic and atraumatic.     Right Ear: Tympanic membrane and ear canal normal.     Left Ear: Tympanic membrane and ear canal normal.     Nose:     Right Sinus: No maxillary sinus tenderness or frontal  sinus tenderness.     Left Sinus: No maxillary sinus tenderness or frontal sinus tenderness.  Cardiovascular:     Rate and Rhythm: Normal rate and regular rhythm.     Pulses: Normal pulses.     Heart sounds: No murmur heard. Pulmonary:     Effort: Pulmonary effort is normal. No respiratory distress.     Breath sounds: Normal breath sounds. No wheezing or rhonchi.  Musculoskeletal:     Cervical back: Normal range of motion.     Right lower leg: No edema.     Left lower leg: No edema.  Lymphadenopathy:     Cervical: No cervical adenopathy.  Skin:    General: Skin is warm and dry.     Findings: No rash.  Neurological:     Mental Status: He is alert and oriented to person, place, and time.  Psychiatric:        Mood and Affect: Mood normal.        Behavior: Behavior normal.    Wt Readings from Last 3 Encounters:  09/06/21 183 lb (83 kg)  06/20/21 182 lb (82.6 kg)  04/25/21 184 lb 3.2 oz (83.6 kg)    BP (!) 150/74    Pulse (!) 58    Temp 97.8 F (36.6 C) (Oral)    Ht $R'5\' 7"'dg$  (1.702 m)    Wt 183 lb (83 kg)    SpO2 97%    BMI 28.66 kg/m   Assessment and Plan: 1. Bronchitis He will continue the Amox 875 mg bid given by his brother in law. If he still has sputum production after completing the 7 days, then call for additional Rx. Push fluids. - promethazine-dextromethorphan (PROMETHAZINE-DM) 6.25-15 MG/5ML syrup; Take 5 mLs by mouth 4 (four) times daily as needed for cough.  Dispense: 118 mL; Refill: 0  He mentions some memory concerns - family and friends are pointing it out.  Recommend return in a few months for 6-CIT and recheck of BP that is elevated today.  Partially dictated using Editor, commissioning. Any errors  are unintentional.  Halina Maidens, MD [facility]  09/06/2021

## 2021-10-05 ENCOUNTER — Other Ambulatory Visit: Payer: Self-pay | Admitting: Internal Medicine

## 2021-10-05 ENCOUNTER — Other Ambulatory Visit: Payer: Self-pay | Admitting: Cardiovascular Disease

## 2021-10-05 NOTE — Telephone Encounter (Signed)
Please schedule overdue 6 month F/U appt. Thank you! 

## 2021-10-05 NOTE — Telephone Encounter (Signed)
Patient family member Blanch Media asked for [place] to call back another time ? ?

## 2021-10-05 NOTE — Telephone Encounter (Signed)
Requested Prescriptions  ?Pending Prescriptions Disp Refills  ?? ezetimibe (ZETIA) 10 MG tablet [Pharmacy Med Name: EZETIMIBE 10 MG Tablet] 90 tablet 0  ?  Sig: TAKE 1 TABLET EVERY DAY  ?  ? Cardiovascular:  Antilipid - Sterol Transport Inhibitors Failed - 10/05/2021 10:11 AM  ?  ?  Failed - Lipid Panel in normal range within the last 12 months  ?  Cholesterol, Total  ?Date Value Ref Range Status  ?04/13/2021 170 100 - 199 mg/dL Final  ? ?LDL Chol Calc (NIH)  ?Date Value Ref Range Status  ?04/13/2021 92 0 - 99 mg/dL Final  ? ?LDL Direct  ?Date Value Ref Range Status  ?09/24/2017 116 (H) 0 - 99 mg/dL Final  ? ?HDL  ?Date Value Ref Range Status  ?04/13/2021 68 >39 mg/dL Final  ? ?Triglycerides  ?Date Value Ref Range Status  ?04/13/2021 50 0 - 149 mg/dL Final  ? ?  ?  ?  Passed - AST in normal range and within 360 days  ?  AST  ?Date Value Ref Range Status  ?04/13/2021 25 0 - 40 IU/L Final  ?   ?  ?  Passed - ALT in normal range and within 360 days  ?  ALT  ?Date Value Ref Range Status  ?04/13/2021 22 0 - 44 IU/L Final  ?   ?  ?  Passed - Patient is not pregnant  ?  ?  Passed - Valid encounter within last 12 months  ?  Recent Outpatient Visits   ?      ? 4 weeks ago Bronchitis  ? [facility] Glean Hess, MD  ? 5 months ago Annual physical exam  ? [facility] Glean Hess, MD  ? 9 months ago Essential hypertension  ? [facility] Glean Hess, MD  ? 1 year ago Essential hypertension  ? [facility] Glean Hess, MD  ? 1 year ago Annual physical exam  ? [facility] Glean Hess, MD  ?  ?  ?Future Appointments   ?        ? In 1 month Army Melia Jesse Sans, MD [facility], [place]  ? In 6 months Glean Hess, MD [facility], [place]  ?  ? ?  ?  ?  ?? clopidogrel (PLAVIX) 75 MG tablet [Pharmacy Med Name: CLOPIDOGREL 75 MG Tablet] 90 tablet 0  ?  Sig: TAKE 1 TABLET EVERY DAY  ?  ? Hematology: Antiplatelets - clopidogrel Passed - 10/05/2021  10:11 AM  ?  ?  Passed - HCT in normal range and within 180 days  ?  Hematocrit  ?Date Value Ref Range Status  ?04/13/2021 46.2 37.5 - 51.0 % Final  ?   ?  ?  Passed - HGB in normal range and within 180 days  ?  Hemoglobin  ?Date Value Ref Range Status  ?04/13/2021 15.0 13.0 - 17.7 g/dL Final  ?   ?  ?  Passed - PLT in normal range and within 180 days  ?  Platelets  ?Date Value Ref Range Status  ?04/13/2021 248 150 - 450 x10E3/uL Final  ?   ?  ?  Passed - Cr in normal range and within 360 days  ?  Creatinine, Ser  ?Date Value Ref Range Status  ?04/13/2021 1.03 0.76 - 1.27 mg/dL Final  ?   ?  ?  Passed - Valid encounter within last 6 months  ?  Recent Outpatient Visits   ?      ? 4 weeks ago Bronchitis  ? [facility] Glean Hess, MD  ? 5 months ago Annual physical exam  ? [facility] Glean Hess, MD  ? 9 months ago Essential hypertension  ? [facility] Glean Hess, MD  ? 1 year ago Essential hypertension  ? [facility] Glean Hess, MD  ? 1 year ago Annual physical exam  ? [facility] Glean Hess, MD  ?  ?  ?Future Appointments   ?        ? In 1 month Army Melia Jesse Sans, MD [facility], [place]  ? In 6 months Army Melia Jesse Sans, MD [facility], [place]  ?  ? ?  ?  ?  ? ? ?

## 2021-10-09 NOTE — Progress Notes (Signed)
? ?Cardiology Office Note   ? ?Date:  10/12/2021  ? ?ID:  Todd Weaver, DOB Sep 07, 1950, MRN [id number] ? ?PCP:  Glean Hess, MD  ?Cardiologist:  Kathlyn Sacramento, MD  ?Electrophysiologist:  None  ? ?Chief Complaint: Follow-up ? ?History of Present Illness:  ? ?Todd Weaver is a 71 y.o. male with history of CAD with inferior ST elevation MI complicated by ventricular fibrillation arrest in 06/2017 status post PCI/DES with overlapping drug-eluting stent placement to the RCA and A-fib post procedure status post staged IVUS guided DES to the proximal and ostial LAD in 08/2017, chronic combined systolic and diastolic CHF, CVA, PAD, HTN, HLD, prior tobacco use, and BPH who presents for follow-up of his CAD and cardiomyopathy. ? ?He was admitted in 06/2017 with an inferior ST elevation MI complicated by ventricular fibrillation arrest.  He underwent successful PCI/DES with 2 overlapping drug-eluting stents to the RCA.  LHC at that time also demonstrated significant ostial LAD stenosis, borderline significant proximal LAD stenosis, and moderate distal LAD stenosis.  LVEF was mildly reduced.  Postprocedure, he had A-fib that was successfully treated with amiodarone.  He underwent staged IVUS guided PCI/DES to the ostial and proximal LAD in 08/2017.  Repeat LHC in 10/2017 showed widely patent LAD and RCA stents with no significant restenosis.  The ramus branch was pinched by the LAD stent with 70% ostial stenosis.  LVEF was 50 to 55%. ? ?He has known PAD that has been managed medically.  Noninvasive vascular studies in 01/2019 demonstrated  moderately reduced ABI in the 0.7 range bilaterally with diffuse moderate SFA disease worse on the left side.  He had repeat ABI done in 01/2020 which was stable overall. ?Nuclear stress test in 08/2019 showed no evidence of ischemia with normal ejection fraction.  Echo in 09/2019 showed an EF of 45 to 50% with mild mitral regurgitation. ? ?He was most recently seen in the office by Dr.  Rockey Situ on 01/05/2021, and noted some left-sided chest discomfort that radiated into his left arm.  Symptoms were typically noted when he was laying in the bed, laying on his left side.  These episodes felt similar to his prior angina.  He was without exertional symptoms.  He underwent Lexiscan MPI on 01/18/2021 which was overall low risk and showed no evidence of ischemia.  LVEF appeared visually normal, though calculated value was mildly reduced at 47%. ? ?He comes in today noting a progression of his left arm and shoulder discomfort.  He has not had any frank chest pain.  However, he did recently have an episode of profound exhaustion with associated nausea after climbing a long flight of stairs.  Symptoms do not feel exactly similar to what he experienced leading up to his MI.  However, they are quite concerning to him.  He has noted no swelling of the upper extremities or lower extremities.  No nonhealing wounds.  At times, there is some paresthesia involving the digits of the left upper extremity.  He has been adherent and tolerating cardiac medications.  No dizziness, presyncope, or syncope. ? ?Labs independently reviewed: ?03/2021 - TC 170, TG 50, HDL 68, LDL 92, A1c 6.0, BUN 10, serum creatinine 1.03, potassium 4.5, albumin 4.8, AST/ALT normal, Hgb 15.0, PLT 248 ?06/2017 - TSH normal ? ?Past Medical History:  ?Diagnosis Date  ? Acute ST elevation myocardial infarction (STEMI) of inferior wall ([place]) 07/18/2017  ? CAD (coronary artery disease)   ? a. 03/2016 MV: EF 55-65%, low risk; b.  06/2017 Inf STEMI: LM 85d, LAD 60p, RCA 99ost (3.25x18 Xience [place] DES/3.0x38 [place] DES), RPAV 60; c. 08/2017 PCI dLM/LAD (3.0x38 Xience [place] DES); d. 10/2017 Cath: LM/prox LAD patent, LAD 4m 40d, RI 70, LCX 40p, RCA patent stent. EF 50-55; e. 08/2019 MV: Low risk EF 55-65%.  ? Contrast media allergy   ? GERD (gastroesophageal reflux disease)   ? Hemorrhoid   ? Hyperlipidemia   ? Hypertension   ? Ischemic cardiomyopathy   ? a.  06/2017 Echo: EF 45-50%, mod inf/infsept HK. Mildly dil LA; b. 09/2019 Echo: EF 45-50%, Gr2 DD. Mild MR.  ? PAD (peripheral artery disease) ([place]   ? a. 01/2019 ABIs and Duplex: ABI R 0.78, L 0.79; TBI R 0.66, L 1.11. RCIA >50p, RSFA 50-74. LCIA >50p, LSFA 75-99p, 30-49d. 3 vessel runoff bilat.  ? PAF (paroxysmal atrial fibrillation) ([place]   ? a. Brief episode during December hospitalization-->converted on IV amio-->not on ODixon  ? Stroke ([facility] 2011  ? "2 MINI STROKES". no deficits.  ? Unstable angina (HCC)   ? ? ?Past Surgical History:  ?Procedure Laterality Date  ? ANAL FISTULECTOMY N/A 12/21/2014  ? Procedure: FISTULECTOMY ANAL;  Surgeon: SChristene Lye MD;  Location: [facility] ORS;  Service: General;  Laterality: N/A;  ? APPENDECTOMY  1975  ? CARDIAC CATHETERIZATION    ? COLONOSCOPY WITH PROPOFOL N/A 02/12/2019  ? Procedure: COLONOSCOPY WITH PROPOFOL;  Surgeon: WLucilla Lame MD;  [facility];  Service: Endoscopy;  Laterality: N/A;  ? CORONARY STENT INTERVENTION N/A 09/09/2017  ? Procedure: CORONARY STENT INTERVENTION;  Surgeon: AWellington Hampshire MD;  [place] [facility];  Service: Cardiovascular;  Laterality: N/A;  ? CORONARY/GRAFT ACUTE MI REVASCULARIZATION N/A 07/18/2017  ? Procedure: Coronary/Graft Acute MI Revascularization;  Surgeon: AWellington Hampshire MD;  [place] [facility];  Service: Cardiovascular;  Laterality: N/A;  ? HEMORRHOID SURGERY N/A 12/21/2014  ? Procedure: HEMORRHOIDECTOMY;  Surgeon: SChristene Lye MD;  Location: [facility] ORS;  Service: General;  Laterality: N/A;  ? INTRAVASCULAR ULTRASOUND/IVUS N/A 09/09/2017  ? Procedure: Intravascular Ultrasound/IVUS;  Surgeon: AWellington Hampshire MD;  [place];  Service: Cardiovascular;  Laterality: N/A;  ? KNEE SURGERY Right age 71 ? LEFT HEART CATH AND CORONARY ANGIOGRAPHY N/A 07/18/2017  ? Procedure: LEFT HEART CATH AND CORONARY ANGIOGRAPHY;  Surgeon: AWellington Hampshire MD;  [place];  Service: Cardiovascular;  Laterality: N/A;  ? LEFT HEART CATH AND CORONARY ANGIOGRAPHY Left 08/19/2017  ? Procedure: LEFT HEART CATH AND CORONARY ANGIOGRAPHY;  Surgeon: AWellington Hampshire MD;  [place];  Service: Cardiovascular;  Laterality: Left;  ? LEFT HEART CATH AND CORONARY ANGIOGRAPHY N/A 11/07/2017  ? Procedure: LEFT HEART CATH AND CORONARY ANGIOGRAPHY;  Surgeon: AWellington Hampshire MD;  [place];  Service: Cardiovascular;  Laterality: N/A;  ? ROTATOR CUFF REPAIR Right 2013  ? ? ?Current Medications: ?Current Meds  ?Medication Sig  ? amLODipine (NORVASC) 5 MG tablet TAKE 1 TABLET EVERY DAY  ? aspirin EC 81 MG tablet Take 81 mg by mouth daily.   ? atorvastatin (LIPITOR) 80 MG tablet TAKE 1 TABLET EVERY DAY AT 6:00PM  ? carvedilol (COREG) 3.125 MG tablet Take 1 tablet (3.125 mg total) by mouth 2 (two) times daily with a meal.  ? clopidogrel (PLAVIX) 75 MG tablet TAKE 1 TABLET EVERY DAY  ? ezetimibe (ZETIA) 10 MG tablet TAKE 1 TABLET EVERY DAY  ? furosemide (  LASIX) 20 MG tablet TAKE 1 TABLET EVERY DAY  ? nitroGLYCERIN (NITROSTAT) 0.4 MG SL tablet Place 1 tablet (0.4 mg total) under the tongue every 5 (five) minutes as needed for chest pain.  ? oxybutynin (DITROPAN-XL) 10 MG 24 hr tablet Take 1 tablet (10 mg total) by mouth at bedtime.  ? potassium chloride (KLOR-CON) 10 MEQ tablet TAKE 1 TABLET (10 MEQ TOTAL) BY MOUTH DAILY.  ? sildenafil (REVATIO) 20 MG tablet Take 1 tablet (20 mg total) by mouth 3 (three) times daily as needed.  ? tamsulosin (FLOMAX) 0.4 MG CAPS capsule TAKE 1 CAPSULE (0.4 MG TOTAL) BY MOUTH DAILY.  ? ? ?Allergies:   Codeine, Shellfish allergy, Contrast media [iodinated contrast media], and Fish allergy  ? ?Social History  ? ?Socioeconomic History  ? Marital status: Married  ?  Spouse name: Not on file  ? Number of children: 2  ? Years of education: Not on file  ? Highest education level: 11th grade  ?Occupational History  ?  Occupation: Retired  ?Tobacco Use  ? Smoking status: Former  ?  Types: Cigarettes  ?  Quit date: 07/24/2007  ?  Years since quitting: 14.2  ? Smokeless tobacco: Never  ? Tobacco comments:  ?  smoking cessation materials n

## 2021-10-09 NOTE — H&P (View-Only) (Signed)
? ?Cardiology Office Note   ? ?Date:  10/12/2021  ? ?ID:  TOLBERT Todd Weaver, DOB 23-Aug-1950, MRN [id number] ? ?PCP:  Glean Hess, MD  ?Cardiologist:  Kathlyn Sacramento, MD  ?Electrophysiologist:  None  ? ?Chief Complaint: Follow-up ? ?History of Present Illness:  ? ?Todd Weaver is a 71 y.o. male with history of CAD with inferior ST elevation MI complicated by ventricular fibrillation arrest in 06/2017 status post PCI/DES with overlapping drug-eluting stent placement to the RCA and A-fib post procedure status post staged IVUS guided DES to the proximal and ostial LAD in 08/2017, chronic combined systolic and diastolic CHF, CVA, PAD, HTN, HLD, prior tobacco use, and BPH who presents for follow-up of his CAD and cardiomyopathy. ? ?He was admitted in 06/2017 with an inferior ST elevation MI complicated by ventricular fibrillation arrest.  He underwent successful PCI/DES with 2 overlapping drug-eluting stents to the RCA.  LHC at that time also demonstrated significant ostial LAD stenosis, borderline significant proximal LAD stenosis, and moderate distal LAD stenosis.  LVEF was mildly reduced.  Postprocedure, he had A-fib that was successfully treated with amiodarone.  He underwent staged IVUS guided PCI/DES to the ostial and proximal LAD in 08/2017.  Repeat LHC in 10/2017 showed widely patent LAD and RCA stents with no significant restenosis.  The ramus branch was pinched by the LAD stent with 70% ostial stenosis.  LVEF was 50 to 55%. ? ?He has known PAD that has been managed medically.  Noninvasive vascular studies in 01/2019 demonstrated  moderately reduced ABI in the 0.7 range bilaterally with diffuse moderate SFA disease worse on the left side.  He had repeat ABI done in 01/2020 which was stable overall. ?Nuclear stress test in 08/2019 showed no evidence of ischemia with normal ejection fraction.  Echo in 09/2019 showed an EF of 45 to 50% with mild mitral regurgitation. ? ?He was most recently seen in the office by Dr.  Rockey Situ on 01/05/2021, and noted some left-sided chest discomfort that radiated into his left arm.  Symptoms were typically noted when he was laying in the bed, laying on his left side.  These episodes felt similar to his prior angina.  He was without exertional symptoms.  He underwent Lexiscan MPI on 01/18/2021 which was overall low risk and showed no evidence of ischemia.  LVEF appeared visually normal, though calculated value was mildly reduced at 47%. ? ?He comes in today noting a progression of his left arm and shoulder discomfort.  He has not had any frank chest pain.  However, he did recently have an episode of profound exhaustion with associated nausea after climbing a long flight of stairs.  Symptoms do not feel exactly similar to what he experienced leading up to his MI.  However, they are quite concerning to him.  He has noted no swelling of the upper extremities or lower extremities.  No nonhealing wounds.  At times, there is some paresthesia involving the digits of the left upper extremity.  He has been adherent and tolerating cardiac medications.  No dizziness, presyncope, or syncope. ? ?Labs independently reviewed: ?03/2021 - TC 170, TG 50, HDL 68, LDL 92, A1c 6.0, BUN 10, serum creatinine 1.03, potassium 4.5, albumin 4.8, AST/ALT normal, Hgb 15.0, PLT 248 ?06/2017 - TSH normal ? ?Past Medical History:  ?Diagnosis Date  ? Acute ST elevation myocardial infarction (STEMI) of inferior wall ([place]) 07/18/2017  ? CAD (coronary artery disease)   ? a. 03/2016 MV: EF 55-65%, low risk; b.  06/2017 Inf STEMI: LM 85d, LAD 60p, RCA 99ost (3.25x18 Xience [place] DES/3.0x38 Rand DES), RPAV 60; c. 08/2017 PCI dLM/LAD (3.0x38 Xience [place] DES); d. 10/2017 Cath: LM/prox LAD patent, LAD 59m 40d, RI 70, LCX 40p, RCA patent stent. EF 50-55; e. 08/2019 MV: Low risk EF 55-65%.  ? Contrast media allergy   ? GERD (gastroesophageal reflux disease)   ? Hemorrhoid   ? Hyperlipidemia   ? Hypertension   ? Ischemic cardiomyopathy   ? a.  06/2017 Echo: EF 45-50%, mod inf/infsept HK. Mildly dil LA; b. 09/2019 Echo: EF 45-50%, Gr2 DD. Mild MR.  ? PAD (peripheral artery disease) ([place]   ? a. 01/2019 ABIs and Duplex: ABI R 0.78, L 0.79; TBI R 0.66, L 1.11. RCIA >50p, RSFA 50-74. LCIA >50p, LSFA 75-99p, 30-49d. 3 vessel runoff bilat.  ? PA[place] ([place]oxysmal [place] fibrillation) ([place]   ? a. Brief episode during December hospitalization-->converted on IV amio-->[place]  ? Stroke ([facility] 2011  ? "2 MINI STROKES". no deficits.  ? Unstable angina (HCC)   ? ? ?Past Surgical History:  ?Procedure Laterality Date  ? ANAL FISTULECTOMY N/A 12/21/2014  ? Procedure: FISTULECTOMY ANAL;  Surgeon: SChristene Lye MD;  Location: [facility] ORS;  Service: General;  Laterality: N/A;  ? APPENDECTOMY  1975  ? CARDIAC CATHETERIZATION    ? COLONOSCOPY WITH PROPOFOL N/A 02/12/2019  ? Procedure: COLONOSCOPY WITH PROPOFOL;  Surgeon: WLucilla Lame MD;  [facility];  Service: Endoscopy;  Laterality: N/A;  ? CORONARY STENT INTERVENTION N/A 09/09/2017  ? Procedure: CORONARY STENT INTERVENTION;  Surgeon: AWellington Hampshire MD;  [place];  Service: Cardiovascular;  Laterality: N/A;  ? CORONARY/GRAFT ACUTE MI REVASCULARIZATION N/A 07/18/2017  ? Procedure: Coronary/Graft Acute MI Revascularization;  Surgeon: AWellington Hampshire MD;  [facility];  Service: Cardiovascular;  Laterality: N/A;  ? HEMORRHOID SURGERY N/A 12/21/2014  ? Procedure: HEMORRHOIDECTOMY;  Surgeon: SChristene Lye MD;  Location: [facility] ORS;  Service: General;  Laterality: N/A;  ? INTRAVASCULAR ULTRASOUND/IVUS N/A 09/09/2017  ? Procedure: Intravascular Ultrasound/IVUS;  Surgeon: AWellington Hampshire MD;  [place];  Service: Cardiovascular;  Laterality: N/A;  ? KNEE SURGERY Right age 71 ? LEFT HEART CATH AND CORONARY ANGIOGRAPHY N/A 07/18/2017  ? Procedure: LEFT HEART CATH AND CORONARY ANGIOGRAPHY;  Surgeon: AWellington Hampshire MD;  [place];  Service: Cardiovascular;  Laterality: N/A;  ? LEFT HEART CATH AND CORONARY ANGIOGRAPHY Left 08/19/2017  ? Procedure: LEFT HEART CATH AND CORONARY ANGIOGRAPHY;  Surgeon: AWellington Hampshire MD;  [place] [facility];  Service: Cardiovascular;  Laterality: Left;  ? LEFT HEART CATH AND CORONARY ANGIOGRAPHY N/A 11/07/2017  ? Procedure: LEFT HEART CATH AND CORONARY ANGIOGRAPHY;  Surgeon: AWellington Hampshire MD;  [place];  Service: Cardiovascular;  Laterality: N/A;  ? ROTATOR CUFF REPAIR Right 2013  ? ? ?Current Medications: ?Current Meds  ?Medication Sig  ? amLODipine (NORVASC) 5 MG tablet TAKE 1 TABLET EVERY DAY  ? aspirin EC 81 MG tablet Take 81 mg by mouth daily.   ? atorvastatin (LIPITOR) 80 MG tablet TAKE 1 TABLET EVERY DAY AT 6:00PM  ? carvedilol (COREG) 3.125 MG tablet Take 1 tablet (3.125 mg total) by mouth 2 (two) times daily with a meal.  ? clopidogrel (PLAVIX) 75 MG tablet TAKE 1 TABLET EVERY DAY  ? ezetimibe (ZETIA) 10 MG tablet TAKE 1 TABLET EVERY DAY  ? furosemide (  LASIX) 20 MG tablet TAKE 1 TABLET EVERY DAY  ? nitroGLYCERIN (NITROSTAT) 0.4 MG SL tablet Place 1 tablet (0.4 mg total) under the tongue every 5 (five) minutes as needed for chest pain.  ? oxybutynin (DITROPAN-XL) 10 MG 24 hr tablet Take 1 tablet (10 mg total) by mouth at bedtime.  ? potassium chloride (KLOR-CON) 10 MEQ tablet TAKE 1 TABLET (10 MEQ TOTAL) BY MOUTH DAILY.  ? sildenafil (REVATIO) 20 MG tablet Take 1 tablet (20 mg total) by mouth 3 (three) times daily as needed.  ? tamsulosin (FLOMAX) 0.4 MG CAPS capsule TAKE 1 CAPSULE (0.4 MG TOTAL) BY MOUTH DAILY.  ? ? ?Allergies:   Codeine, Shellfish allergy, Contrast media [iodinated contrast media], and Fish allergy  ? ?Social History  ? ?Socioeconomic History  ? Marital status: Married  ?  Spouse name: Not on file  ? Number of children: 2  ? Years of education: Not on file  ? Highest education level: 11th grade  ?Occupational History  ?  Occupation: Retired  ?Tobacco Use  ? Smoking status: Former  ?  Types: Cigarettes  ?  Quit date: 07/24/2007  ?  Years since quitting: 14.2  ? Smokeless tobacco: Never  ? Tobacco comments:  ?  smoking cessation materials n

## 2021-10-12 ENCOUNTER — Other Ambulatory Visit: Payer: Self-pay

## 2021-10-12 ENCOUNTER — Encounter: Payer: Self-pay | Admitting: Physician Assistant

## 2021-10-12 ENCOUNTER — Telehealth: Payer: Self-pay | Admitting: Physician Assistant

## 2021-10-12 ENCOUNTER — Ambulatory Visit: Payer: Medicare HMO | Admitting: Physician Assistant

## 2021-10-12 ENCOUNTER — Other Ambulatory Visit
Admission: RE | Admit: 2021-10-12 | Discharge: 2021-10-12 | Disposition: A | Discharge status: Home / Self Care | Payer: Medicare HMO | Attending: Physician Assistant | Admitting: Physician Assistant

## 2021-10-12 ENCOUNTER — Other Ambulatory Visit: Payer: Self-pay | Admitting: Physician Assistant

## 2021-10-12 VITALS — BP 154/88 | HR 50 | Ht 67.0 in | Wt 182.0 lb

## 2021-10-12 DIAGNOSIS — I252 Old myocardial infarction: Secondary | ICD-10-CM | POA: Diagnosis not present

## 2021-10-12 DIAGNOSIS — R200 Anesthesia of skin: Secondary | ICD-10-CM | POA: Diagnosis not present

## 2021-10-12 DIAGNOSIS — I255 Ischemic cardiomyopathy: Secondary | ICD-10-CM | POA: Diagnosis not present

## 2021-10-12 DIAGNOSIS — R079 Chest pain, unspecified: Secondary | ICD-10-CM | POA: Insufficient documentation

## 2021-10-12 DIAGNOSIS — I2510 Atherosclerotic heart disease of native coronary artery without angina pectoris: Secondary | ICD-10-CM | POA: Insufficient documentation

## 2021-10-12 DIAGNOSIS — I4891 Unspecified atrial fibrillation: Secondary | ICD-10-CM | POA: Diagnosis not present

## 2021-10-12 DIAGNOSIS — E785 Hyperlipidemia, unspecified: Secondary | ICD-10-CM | POA: Diagnosis not present

## 2021-10-12 DIAGNOSIS — I739 Peripheral vascular disease, unspecified: Secondary | ICD-10-CM | POA: Diagnosis not present

## 2021-10-12 DIAGNOSIS — I10 Essential (primary) hypertension: Secondary | ICD-10-CM | POA: Diagnosis not present

## 2021-10-12 DIAGNOSIS — I25118 Atherosclerotic heart disease of native coronary artery with other forms of angina pectoris: Secondary | ICD-10-CM | POA: Insufficient documentation

## 2021-10-12 DIAGNOSIS — I5042 Chronic combined systolic (congestive) and diastolic (congestive) heart failure: Secondary | ICD-10-CM | POA: Diagnosis not present

## 2021-10-12 DIAGNOSIS — M79602 Pain in left arm: Secondary | ICD-10-CM

## 2021-10-12 DIAGNOSIS — I200 Unstable angina: Secondary | ICD-10-CM

## 2021-10-12 DIAGNOSIS — I5032 Chronic diastolic (congestive) heart failure: Secondary | ICD-10-CM | POA: Insufficient documentation

## 2021-10-12 LAB — CBC
HCT: 42.3 % (ref 39.0–52.0)
Hemoglobin: 13.9 g/dL (ref 13.0–17.0)
MCH: 26.6 pg (ref 26.0–34.0)
MCHC: 32.9 g/dL (ref 30.0–36.0)
MCV: 81 fL (ref 80.0–100.0)
Platelets: 272 10*3/uL (ref 150–400)
RBC: 5.22 MIL/uL (ref 4.22–5.81)
RDW: 15.8 % — ABNORMAL HIGH (ref 11.5–15.5)
WBC: 6 10*3/uL (ref 4.0–10.5)
nRBC: 0 % (ref 0.0–0.2)

## 2021-10-12 LAB — BASIC METABOLIC PANEL
Anion gap: 7 (ref 5–15)
BUN: 13 mg/dL (ref 8–23)
CO2: 27 mmol/L (ref 22–32)
Calcium: 9.3 mg/dL (ref 8.9–10.3)
Chloride: 105 mmol/L (ref 98–111)
Creatinine, Ser: 0.84 mg/dL (ref 0.61–1.24)
GFR, Estimated: >60 mL/min (ref >60)
Glucose, Bld: 101 mg/dL — ABNORMAL HIGH (ref 70–99)
Potassium: 4.1 mmol/L (ref 3.5–5.1)
Sodium: 139 mmol/L (ref 135–145)

## 2021-10-12 MED ORDER — PREDNISONE 50 MG PO TABS: ORAL_TABLET | ORAL | 0 refills | Status: DC

## 2021-10-12 MED ORDER — DIPHENHYDRAMINE HCL 50 MG PO CAPS: ORAL_CAPSULE | ORAL | 0 refills | Status: DC

## 2021-10-12 NOTE — Addendum Note (Signed)
Addended by: Valora Corporal on: 10/12/2021 05:13 PM ? ? Modules accepted: Orders ? ?

## 2021-10-12 NOTE — Progress Notes (Signed)
Orders placed for LHC.  Please see separate phone note for orders regarding contrast allergy prophylaxis. ?

## 2021-10-12 NOTE — Telephone Encounter (Signed)
Patient noted to have contrast allergy.  Please send an outpatient contrast allergy prophylaxis and notify patient to pick up. ?

## 2021-10-12 NOTE — Patient Instructions (Addendum)
Medication Instructions:  ?No changes at this time.  ? ?*If you need a refill on your cardiac medications before your next appointment, please call your pharmacy* ? ? ?Lab Work: ?CBC & BMET today over at the Medical mall entrance. Please check in at registration and they will direct you from there.  ? ? ?If you have labs (blood work) drawn today and your tests are completely normal, you will receive your results only by: ?MyChart Message (if you have MyChart) OR ?A paper copy in the mail ?If you have any lab test that is abnormal or we need to change your treatment, we will call you to review the results. ? ? ?Testing/Procedures: ?Your physician has requested that you have an echocardiogram. Echocardiography is a painless test that uses sound waves to create images of your heart. It provides your doctor with information about the size and shape of your heart and how well your heart?s chambers and valves are working. This procedure takes approximately one hour. There are no restrictions for this procedure. ? ?Your physician has requested that you have a lower or upper extremity venous duplex. This test is an ultrasound of the veins in the legs or arms. It looks at venous blood flow that carries blood from the heart to the legs or arms. Allow one hour for a Lower Venous exam. Allow thirty minutes for an Upper Venous exam. There are no restrictions or special instructions. ? ? ?[facility] Cardiac Cath Instructions ? ?You are scheduled for a Cardiac Cath on: March 28th  ?Please arrive at 09:30 am on the day of your procedure ?Please expect a call from our [facility] to pre-register you ?Do not eat/drink anything after midnight ?Someone will need to drive you home ?It is recommended someone be with you for the first 24 hours after your procedure ?Wear clothes that are easy to get on/off and wear slip on shoes if possible ? ? ?_XX__ Do not take these medications before your procedure: Furosemide & Potassium.  You may take these after you return home.  ? ? ?Day of your procedure: ?Arrive at the [facility] entrance.  Free valet service is available.  After entering the [facility] please check-in at the registration desk (1st desk on your right) to receive your armband. After receiving your armband someone will escort you to the cardiac cath/special procedures waiting area. ? ?The usual length of stay after your procedure is about 2 to 3 hours.  This can vary. ? ?If you have any questions, please call our office at [phone], or you may call the cardiac cath lab at [facility] directly at [phone] ? ? ? ?Follow-Up: ?At [facility], you and your health needs are our priority.  As part of our continuing mission to provide you with exceptional heart care, we have created designated Provider Care Teams.  These Care Teams include your primary Cardiologist (physician) and Advanced Practice Providers (APPs -  Physician Assistants and Nurse Practitioners) who all work together to provide you with the care you need, when you need it. ? ? ?Your next appointment:   ?1 month(s) ? ?The format for your next appointment:   ?In Person ? ?Provider:   ?Kathlyn Sacramento, MD or [name], PA-C ?

## 2021-10-13 NOTE — Telephone Encounter (Signed)
Late entry. Left voicemail message for patient at 1:56 pm to call back. Sent My Chart message with instructions as well.  ?

## 2021-10-16 MED ORDER — PREDNISONE 50 MG PO TABS: ORAL_TABLET | ORAL | 0 refills | Status: DC

## 2021-10-16 NOTE — Addendum Note (Signed)
Addended by: Lamar Laundry on: 10/16/2021 08:29 AM ? ? Modules accepted: Orders ? ?

## 2021-10-16 NOTE — Telephone Encounter (Signed)
Spoke with the patient. ?Patient cardiac cath is scheduled on 10/17/21. ?Patient made aware of the need for pre cath prophylaxis allergy treatment with Prednisone due to his contrast allergy. ? ?Patient sts that he did pick up the Prednisone 50 mg #3 and took one tablet last night. ?Advised the patient that was not correct. I will  send in an additional tablet of Prednisone 50 mg and he is to take as directed prior to his 10/17/21 cardiac cath. ? ?13 hours prior- Prednisone 50 mg  ?7 hours prior- Prednisone 50 mg  ?1 hour prior- Prednisone 50 mg and Benadryl 50 mg. ? ?Advised the patient that since he is taking Benadryl he should have someone drive him to the procedure that morning. ?Patient verbalized understanding with read back and voiced appreciation for the call. ?

## 2021-10-17 ENCOUNTER — Other Ambulatory Visit: Payer: Self-pay | Admitting: Physician Assistant

## 2021-10-17 ENCOUNTER — Ambulatory Visit
Admission: RE | Admit: 2021-10-17 | Discharge: 2021-10-17 | Disposition: A | Discharge status: Home / Self Care | Payer: Medicare HMO | Attending: Cardiovascular Disease | Admitting: Cardiovascular Disease

## 2021-10-17 ENCOUNTER — Encounter
Admission: RE | Disposition: A | Discharge status: Home / Self Care | Payer: Self-pay | Source: Home / Self Care | Attending: Cardiovascular Disease

## 2021-10-17 ENCOUNTER — Encounter: Payer: Self-pay | Admitting: Cardiovascular Disease

## 2021-10-17 DIAGNOSIS — Z7902 Long term (current) use of antithrombotics/antiplatelets: Secondary | ICD-10-CM | POA: Diagnosis not present

## 2021-10-17 DIAGNOSIS — I5042 Chronic combined systolic (congestive) and diastolic (congestive) heart failure: Secondary | ICD-10-CM | POA: Insufficient documentation

## 2021-10-17 DIAGNOSIS — E785 Hyperlipidemia, unspecified: Secondary | ICD-10-CM | POA: Diagnosis not present

## 2021-10-17 DIAGNOSIS — I4819 Other persistent atrial fibrillation: Secondary | ICD-10-CM | POA: Diagnosis not present

## 2021-10-17 DIAGNOSIS — Z955 Presence of coronary angioplasty implant and graft: Secondary | ICD-10-CM | POA: Diagnosis not present

## 2021-10-17 DIAGNOSIS — Z7982 Long term (current) use of aspirin: Secondary | ICD-10-CM | POA: Insufficient documentation

## 2021-10-17 DIAGNOSIS — M79602 Pain in left arm: Secondary | ICD-10-CM

## 2021-10-17 DIAGNOSIS — I739 Peripheral vascular disease, unspecified: Secondary | ICD-10-CM | POA: Insufficient documentation

## 2021-10-17 DIAGNOSIS — I110 Hypertensive heart disease with heart failure: Secondary | ICD-10-CM | POA: Diagnosis not present

## 2021-10-17 DIAGNOSIS — Z79899 Other long term (current) drug therapy: Secondary | ICD-10-CM | POA: Diagnosis not present

## 2021-10-17 DIAGNOSIS — R079 Chest pain, unspecified: Secondary | ICD-10-CM

## 2021-10-17 DIAGNOSIS — I255 Ischemic cardiomyopathy: Secondary | ICD-10-CM | POA: Diagnosis not present

## 2021-10-17 DIAGNOSIS — I25118 Atherosclerotic heart disease of native coronary artery with other forms of angina pectoris: Secondary | ICD-10-CM | POA: Insufficient documentation

## 2021-10-17 DIAGNOSIS — I200 Unstable angina: Secondary | ICD-10-CM

## 2021-10-17 DIAGNOSIS — Z87891 Personal history of nicotine dependence: Secondary | ICD-10-CM | POA: Diagnosis not present

## 2021-10-17 DIAGNOSIS — I4891 Unspecified atrial fibrillation: Secondary | ICD-10-CM

## 2021-10-17 DIAGNOSIS — I2510 Atherosclerotic heart disease of native coronary artery without angina pectoris: Secondary | ICD-10-CM | POA: Diagnosis not present

## 2021-10-17 DIAGNOSIS — Z8673 Personal history of transient ischemic attack (TIA), and cerebral infarction without residual deficits: Secondary | ICD-10-CM | POA: Insufficient documentation

## 2021-10-17 DIAGNOSIS — I252 Old myocardial infarction: Secondary | ICD-10-CM | POA: Insufficient documentation

## 2021-10-17 HISTORY — PX: LEFT HEART CATH AND CORONARY ANGIOGRAPHY: CATH118249

## 2021-10-17 SURGERY — LEFT HEART CATH AND CORONARY ANGIOGRAPHY
Anesthesia: Moderate Sedation

## 2021-10-17 MED ORDER — SODIUM CHLORIDE 0.9% FLUSH: 3.0000 mL | INTRAVENOUS | Status: DC | PRN

## 2021-10-17 MED ORDER — HEPARIN (PORCINE) IN NACL 1000-0.9 UT/500ML-% IV SOLN
INTRAVENOUS | Status: AC
  Filled 2021-10-17: qty 1000

## 2021-10-17 MED ORDER — FENTANYL CITRATE (PF) 100 MCG/2ML IJ SOLN
INTRAMUSCULAR | Status: DC | PRN
  Administered 2021-10-17: 25 ug via INTRAVENOUS

## 2021-10-17 MED ORDER — HEPARIN (PORCINE) IN NACL 1000-0.9 UT/500ML-% IV SOLN
INTRAVENOUS | Status: DC | PRN
  Administered 2021-10-17 (×2): 500 mL

## 2021-10-17 MED ORDER — SODIUM CHLORIDE 0.9 % IV SOLN: 250.0000 mL | INTRAVENOUS | Status: DC | PRN

## 2021-10-17 MED ORDER — FENTANYL CITRATE (PF) 100 MCG/2ML IJ SOLN
INTRAMUSCULAR | Status: AC
  Filled 2021-10-17: qty 2

## 2021-10-17 MED ORDER — LIDOCAINE HCL (PF) 1 % IJ SOLN
INTRAMUSCULAR | Status: DC | PRN
  Administered 2021-10-17: 2 mL

## 2021-10-17 MED ORDER — SODIUM CHLORIDE 0.9 % WEIGHT BASED INFUSION
1.0000 mL/kg/h | INTRAVENOUS | Status: DC
  Administered 2021-10-17: 1 mL/kg/h via INTRAVENOUS

## 2021-10-17 MED ORDER — SODIUM CHLORIDE 0.9% FLUSH: 3.0000 mL | Freq: Two times a day (BID) | INTRAVENOUS | Status: DC

## 2021-10-17 MED ORDER — HEPARIN SODIUM (PORCINE) 1000 UNIT/ML IJ SOLN
INTRAMUSCULAR | Status: AC
  Filled 2021-10-17: qty 10

## 2021-10-17 MED ORDER — VERAPAMIL HCL 2.5 MG/ML IV SOLN
INTRAVENOUS | Status: DC | PRN
  Administered 2021-10-17: 2.5 mg via INTRA_ARTERIAL

## 2021-10-17 MED ORDER — SODIUM CHLORIDE 0.9 % IV SOLN: INTRAVENOUS | Status: DC

## 2021-10-17 MED ORDER — ONDANSETRON HCL 4 MG/2ML IJ SOLN: 4.0000 mg | Freq: Four times a day (QID) | INTRAMUSCULAR | Status: DC | PRN

## 2021-10-17 MED ORDER — LIDOCAINE HCL 1 % IJ SOLN
INTRAMUSCULAR | Status: AC
  Filled 2021-10-17: qty 20

## 2021-10-17 MED ORDER — MIDAZOLAM HCL 2 MG/2ML IJ SOLN
INTRAMUSCULAR | Status: DC | PRN
  Administered 2021-10-17: 1 mg via INTRAVENOUS

## 2021-10-17 MED ORDER — ACETAMINOPHEN 325 MG PO TABS: 650.0000 mg | ORAL_TABLET | ORAL | Status: DC | PRN

## 2021-10-17 MED ORDER — MIDAZOLAM HCL 2 MG/2ML IJ SOLN
INTRAMUSCULAR | Status: AC
  Filled 2021-10-17: qty 2

## 2021-10-17 MED ORDER — SODIUM CHLORIDE 0.9 % WEIGHT BASED INFUSION
3.0000 mL/kg/h | INTRAVENOUS | Status: AC
  Administered 2021-10-17: 3 mL/kg/h via INTRAVENOUS

## 2021-10-17 MED ORDER — IOHEXOL 300 MG/ML  SOLN
INTRAMUSCULAR | Status: DC | PRN
  Administered 2021-10-17: 45 mL

## 2021-10-17 MED ORDER — VERAPAMIL HCL 2.5 MG/ML IV SOLN
INTRAVENOUS | Status: AC
  Filled 2021-10-17: qty 2

## 2021-10-17 MED ORDER — HEPARIN SODIUM (PORCINE) 1000 UNIT/ML IJ SOLN
INTRAMUSCULAR | Status: DC | PRN
  Administered 2021-10-17: 4000 [IU] via INTRAVENOUS

## 2021-10-17 SURGICAL SUPPLY — 11 items
BAND ZEPHYR COMPRESS 30 LONG (HEMOSTASIS) ×1 IMPLANT
CATH INFINITI 5FR JK (CATHETERS) ×1 IMPLANT
DRAPE BRACHIAL (DRAPES) ×1 IMPLANT
GLIDESHEATH SLEND SS 6F .021 (SHEATH) ×1 IMPLANT
GUIDEWIRE INQWIRE 1.5J.035X260 (WIRE) IMPLANT
INQWIRE 1.5J .035X260CM (WIRE) ×2
KIT SYRINGE INJ CVI SPIKEX1 (MISCELLANEOUS) ×1 IMPLANT
PACK CARDIAC CATH (CUSTOM PROCEDURE TRAY) ×2 IMPLANT
PROTECTION STATION PRESSURIZED (MISCELLANEOUS) ×2
SET ATX SIMPLICITY (MISCELLANEOUS) ×1 IMPLANT
STATION PROTECTION PRESSURIZED (MISCELLANEOUS) IMPLANT

## 2021-10-17 NOTE — Interval H&P Note (Signed)
History and Physical Interval Note: ? ?10/17/2021 ?10:36 AM ? ?Todd Weaver  has presented today for surgery, with the diagnosis of LT Heart Cath   Chest pain.  The various methods of treatment have been discussed with the patient and family. After consideration of risks, benefits and other options for treatment, the patient has consented to  Procedure(s): ?LEFT HEART CATH AND CORONARY ANGIOGRAPHY (N/A) as a surgical intervention.  The patient's history has been reviewed, patient examined, no change in status, stable for surgery.  I have reviewed the patient's chart and labs.  Questions were answered to the patient's satisfaction.   ? ? ?Kathlyn Sacramento ? ? ?

## 2021-10-27 ENCOUNTER — Other Ambulatory Visit: Payer: Medicare HMO

## 2021-11-07 ENCOUNTER — Ambulatory Visit (INDEPENDENT_AMBULATORY_CARE_PROVIDER_SITE_OTHER): Payer: Medicare HMO | Admitting: Internal Medicine

## 2021-11-07 ENCOUNTER — Encounter: Payer: Self-pay | Admitting: Internal Medicine

## 2021-11-07 VITALS — BP 128/72 | HR 55 | Ht 67.0 in | Wt 182.0 lb

## 2021-11-07 DIAGNOSIS — G3184 Mild cognitive impairment, so stated: Secondary | ICD-10-CM | POA: Diagnosis not present

## 2021-11-07 DIAGNOSIS — I10 Essential (primary) hypertension: Secondary | ICD-10-CM

## 2021-11-07 DIAGNOSIS — D6859 Other primary thrombophilia: Secondary | ICD-10-CM | POA: Diagnosis not present

## 2021-11-07 DIAGNOSIS — Z113 Encounter for screening for infections with a predominantly sexual mode of transmission: Secondary | ICD-10-CM | POA: Diagnosis not present

## 2021-11-07 DIAGNOSIS — M25512 Pain in left shoulder: Secondary | ICD-10-CM

## 2021-11-07 DIAGNOSIS — R7303 Prediabetes: Secondary | ICD-10-CM

## 2021-11-07 NOTE — Progress Notes (Signed)
? ? ?Date:  11/07/2021  ? ?Name:  Todd Weaver   DOB:  04-Jan-1951   MRN:  [id number] ? ? ?Chief Complaint: Hypertension ? ?Hypertension ?This is a chronic problem. Pertinent negatives include no chest pain, headaches, palpitations or shortness of breath. Past treatments include beta blockers, calcium channel blockers and diuretics.  ?Shoulder Pain  ?The pain is present in the left shoulder and left arm. This is a new problem. The current episode started more than 1 month ago. The problem has been unchanged. The quality of the pain is described as aching. The pain is moderate.  ?Memory concerns - mentioned last sick visit.  Family and friends have commented.  He has brief moments where he does not know where he is when driving or forgets a phone call or a visit.  He remembers quickly.  He admits to being hyper - always thinking ahead at work to plan the next project.  ? ?Lab Results  ?Component Value Date  ? NA 139 10/12/2021  ? K 4.1 10/12/2021  ? CO2 27 10/12/2021  ? GLUCOSE 101 (H) 10/12/2021  ? BUN 13 10/12/2021  ? CREATININE 0.84 10/12/2021  ? CALCIUM 9.3 10/12/2021  ? EGFR 79 04/13/2021  ? GFRNONAA >60 10/12/2021  ? ?Lab Results  ?Component Value Date  ? CHOL 170 04/13/2021  ? HDL 68 04/13/2021  ? [place] 92 04/13/2021  ? LDLDIRECT 116 (H) 09/24/2017  ? TRIG 50 04/13/2021  ? CHOLHDL 2.5 04/13/2021  ? ?Lab Results  ?Component Value Date  ? TSH 1.232 07/19/2017  ? ?Lab Results  ?Component Value Date  ? HGBA1C 6.0 (H) 04/13/2021  ? ?Lab Results  ?Component Value Date  ? WBC 6.0 10/12/2021  ? HGB 13.9 10/12/2021  ? HCT 42.3 10/12/2021  ? MCV 81.0 10/12/2021  ? PLT 272 10/12/2021  ? ?Lab Results  ?Component Value Date  ? ALT 22 04/13/2021  ? AST 25 04/13/2021  ? ALKPHOS 71 04/13/2021  ? BILITOT 1.1 04/13/2021  ? ?No results found for: 25OHVITD2, Ricardo, VD25OH  ? ?Review of Systems  ?Constitutional:  Negative for fatigue and unexpected weight change.  ?HENT:  Negative for nosebleeds.   ?Eyes:  Negative for visual  disturbance.  ?Respiratory:  Negative for cough, chest tightness, shortness of breath and wheezing.   ?Cardiovascular:  Negative for chest pain, palpitations and leg swelling.  ?Gastrointestinal:  Negative for abdominal pain, constipation and diarrhea.  ?Musculoskeletal:  Positive for arthralgias (left shoulder pain).  ?Neurological:  Negative for dizziness, weakness, light-headedness and headaches.  ?Psychiatric/Behavioral:  Positive for decreased concentration. Negative for dysphoric mood and suicidal ideas. The patient is not nervous/anxious.   ? ?Patient Active Problem List  ? Diagnosis Date Noted  ? Neck muscle spasm 12/26/2020  ? Urinary hesitancy 12/26/2020  ? Carpal tunnel syndrome of right wrist 12/26/2020  ? Chronic systolic congestive heart failure ([place]) 09/29/2020  ? Thrombophilia ([place]) 09/29/2020  ? PAF (paroxysmal atrial fibrillation) ([place]) 04/11/2020  ? PAD (peripheral artery disease) ([place]) 03/31/2019  ? Rectal polyp   ? Impingement syndrome of left shoulder region 10/14/2018  ? Coronary artery disease of native artery of native heart with stable angina pectoris ([place]) 10/03/2018  ? Chest pain 11/06/2017  ? History of ST elevation myocardial infarction (STEMI) 07/18/2017  ? Essential hypertension 06/19/2017  ? Mixed hyperlipidemia 06/19/2017  ? History of CVA (cerebrovascular accident) 06/19/2017  ? Overweight (BMI 25.0-29.9) 06/19/2017  ? BPH (benign prostatic hyperplasia) 06/19/2017  ? ED (erectile dysfunction) 06/19/2017  ?  MCI (mild cognitive impairment) 06/19/2017  ? Lumbar spondylosis with myelopathy 11/08/2016  ? Hand dermatitis 07/26/2014  ? ? ?Allergies  ?Allergen Reactions  ? Codeine Hives  ? Shellfish Allergy Hives and Swelling  ? Contrast Media [Iodinated Contrast Media] Itching and Other (See Comments)  ?  [place] on tongue  ? Fish Allergy Itching  ? ? ?Past Surgical History:  ?Procedure Laterality Date  ? ANAL FISTULECTOMY N/A 12/21/2014  ? Procedure: FISTULECTOMY ANAL;  Surgeon:  Christene Lye, MD;  Location: [facility] ORS;  Service: General;  Laterality: N/A;  ? APPENDECTOMY  1975  ? CARDIAC CATHETERIZATION    ? COLONOSCOPY WITH PROPOFOL N/A 02/12/2019  ? Procedure: COLONOSCOPY WITH PROPOFOL;  Surgeon: Lucilla Lame, MD;  Location: [facility] ENDOSCOPY;  Service: Endoscopy;  Laterality: N/A;  ? CORONARY STENT INTERVENTION N/A 09/09/2017  ? Procedure: CORONARY STENT INTERVENTION;  Surgeon: Wellington Hampshire, MD;  Location: [place] CV LAB;  Service: Cardiovascular;  Laterality: N/A;  ? CORONARY/GRAFT ACUTE MI REVASCULARIZATION N/A 07/18/2017  ? Procedure: Coronary/Graft Acute MI Revascularization;  Surgeon: Wellington Hampshire, MD;  Location: [place] CV LAB;  Service: Cardiovascular;  Laterality: N/A;  ? HEMORRHOID SURGERY N/A 12/21/2014  ? Procedure: HEMORRHOIDECTOMY;  Surgeon: Christene Lye, MD;  Location: [facility] ORS;  Service: General;  Laterality: N/A;  ? INTRAVASCULAR ULTRASOUND/IVUS N/A 09/09/2017  ? Procedure: Intravascular Ultrasound/IVUS;  Surgeon: Wellington Hampshire, MD;  Location: [place] CV LAB;  Service: Cardiovascular;  Laterality: N/A;  ? KNEE SURGERY Right age 79  ? LEFT HEART CATH AND CORONARY ANGIOGRAPHY N/A 07/18/2017  ? Procedure: LEFT HEART CATH AND CORONARY ANGIOGRAPHY;  Surgeon: Wellington Hampshire, MD;  Location: [place] CV LAB;  Service: Cardiovascular;  Laterality: N/A;  ? LEFT HEART CATH AND CORONARY ANGIOGRAPHY Left 08/19/2017  ? Procedure: LEFT HEART CATH AND CORONARY ANGIOGRAPHY;  Surgeon: Wellington Hampshire, MD;  Location: [place] CV LAB;  Service: Cardiovascular;  Laterality: Left;  ? LEFT HEART CATH AND CORONARY ANGIOGRAPHY N/A 11/07/2017  ? Procedure: LEFT HEART CATH AND CORONARY ANGIOGRAPHY;  Surgeon: Wellington Hampshire, MD;  Location: [place] CV LAB;  Service: Cardiovascular;  Laterality: N/A;  ? LEFT HEART CATH AND CORONARY ANGIOGRAPHY N/A 10/17/2021  ? Procedure: LEFT HEART CATH AND CORONARY ANGIOGRAPHY;  Surgeon: Wellington Hampshire, MD;  Location: [place] CV LAB;  Service: Cardiovascular;  Laterality: N/A;  ? ROTATOR CUFF REPAIR Right 2013  ? ? ?Social History  ? ?Tobacco Use  ? Smoking status: Former  ?  Types: Cigarettes  ?  Quit date: 07/24/2007  ?  Years since quitting: 14.3  ? Smokeless tobacco: Never  ? Tobacco comments:  ?  smoking cessation materials not required  ?Vaping Use  ? Vaping Use: Never used  ?Substance Use Topics  ? Alcohol use: Yes  ?  Alcohol/week: 5.0 - 6.0 standard drinks  ?  Types: 5 - 6 Cans of beer per week  ?  Comment:  weekly  ? Drug use: No  ? ? ? ?Medication list has been reviewed and updated. ? ?Current Meds  ?Medication Sig  ? amLODipine (NORVASC) 5 MG tablet TAKE 1 TABLET EVERY DAY  ? aspirin EC 81 MG tablet Take 81 mg by mouth daily.   ? atorvastatin (LIPITOR) 80 MG tablet TAKE 1 TABLET EVERY DAY AT 6:00PM  ? carvedilol (COREG) 3.125 MG tablet Take 1 tablet (3.125 mg total) by mouth 2 (two) times daily with a meal.  ? clopidogrel (PLAVIX) 75  MG tablet TAKE 1 TABLET EVERY DAY  ? diclofenac Sodium (VOLTAREN) 1 % GEL Apply 1 application. topically daily as needed (pain).  ? diphenhydrAMINE (BENADRYL) 50 MG capsule Take one capsule 1 hour prior to scan.  ? ezetimibe (ZETIA) 10 MG tablet TAKE 1 TABLET EVERY DAY  ? furosemide (LASIX) 20 MG tablet TAKE 1 TABLET EVERY DAY  ? nitroGLYCERIN (NITROSTAT) 0.4 MG SL tablet Place 1 tablet (0.4 mg total) under the tongue every 5 (five) minutes as needed for chest pain.  ? oxybutynin (DITROPAN-XL) 10 MG 24 hr tablet Take 1 tablet (10 mg total) by mouth at bedtime.  ? potassium chloride (KLOR-CON) 10 MEQ tablet TAKE 1 TABLET (10 MEQ TOTAL) BY MOUTH DAILY.  ? predniSONE (DELTASONE) 50 MG tablet Take one tablet 13 hours, 7 hours, and 1 hour prior to scan.  ? predniSONE (DELTASONE) 50 MG tablet Take as directed prior to Cardiac Cath  ? sildenafil (REVATIO) 20 MG tablet Take 1 tablet (20 mg total) by mouth 3 (three) times daily as needed.  ? tamsulosin (FLOMAX) 0.4 MG CAPS  capsule TAKE 1 CAPSULE (0.4 MG TOTAL) BY MOUTH DAILY.  ? ? ? ?  11/07/2021  ?  8:46 AM 09/06/2021  ?  9:53 AM 04/13/2021  ?  8:25 AM 12/26/2020  ?  2:59 PM  ?GAD 7 : Generalized Anxiety Score  ?Nervous, Anxious, on Ed

## 2021-11-07 NOTE — Patient Instructions (Signed)
Monia Pouch non calorie sweetener ?

## 2021-11-08 LAB — HEMOGLOBIN A1C
Est. average glucose Bld gHb Est-mCnc: 123 mg/dL
Hgb A1c MFr Bld: 5.9 % — ABNORMAL HIGH (ref 4.8–5.6)

## 2021-11-08 LAB — TSH: TSH: 2.39 u[IU]/mL (ref 0.450–4.500)

## 2021-11-08 LAB — RPR: RPR Ser Ql: NONREACTIVE

## 2021-11-08 LAB — SEDIMENTATION RATE: Sed Rate: 2 mm/hr (ref 0–30)

## 2021-11-08 LAB — VITAMIN B12: Vitamin B-12: 707 pg/mL (ref 232–1245)

## 2021-11-08 NOTE — Progress Notes (Deleted)
Cardiology Office Note    Date:  11/08/2021   ID:  Todd Weaver, DOB 31-Aug-1950, MRN [id number]  PCP:  Glean Hess, MD  Cardiologist:  Kathlyn Sacramento, MD  Electrophysiologist:  None   Chief Complaint: Follow up  History of Present Illness:   Todd Weaver is a 71 y.o. male with history of CAD with inferior ST elevation MI complicated by ventricular fibrillation arrest in 06/2017 status post PCI/DES with overlapping drug-eluting stent placement to the RCA and A-fib post procedure status post staged IVUS guided DES to the proximal and ostial LAD in 08/2017, chronic combined systolic and diastolic CHF, CVA, PAD, HTN, HLD, prior tobacco use, and BPH who presents for follow-up of LHC and echo.   He was admitted in 06/2017 with an inferior ST elevation MI complicated by ventricular fibrillation arrest.  He underwent successful PCI/DES with 2 overlapping drug-eluting stents to the RCA.  LHC at that time also demonstrated significant ostial LAD stenosis, borderline significant proximal LAD stenosis, and moderate distal LAD stenosis.  LVEF was mildly reduced.  Postprocedure, he had A-fib that was successfully treated with amiodarone.  He underwent staged IVUS guided PCI/DES to the ostial and proximal LAD in 08/2017.  Repeat LHC in 10/2017 showed widely patent LAD and RCA stents with no significant restenosis.  The ramus branch was pinched by the LAD stent with 70% ostial stenosis.  LVEF was 50 to 55%.   He has known PAD that has been managed medically.  Noninvasive vascular studies in 01/2019 demonstrated  moderately reduced ABI in the 0.7 range bilaterally with diffuse moderate SFA disease worse on the left side.  He had repeat ABI done in 01/2020 which was stable overall. Nuclear stress test in 08/2019 showed no evidence of ischemia with normal ejection fraction.  Echo in 09/2019 showed an EF of 45 to 50% with mild mitral regurgitation.   He was seen in the office by Dr. Rockey Situ on 01/05/2021, and  noted some left-sided chest discomfort that radiated into his left arm.  Symptoms were typically noted when he was laying in the bed, laying on his left side.  These episodes felt similar to his prior angina.  He was without exertional symptoms.  He underwent Lexiscan MPI on 01/18/2021 which was overall low risk and showed no evidence of ischemia.  LVEF appeared visually normal, though calculated value was mildly reduced at 47%.  He was last seen in the office in 09/2021 noting left arm and shoulder pain as well as an episode of profound exhaustion and nausea after climbing some stairs.  LHC on 10/17/2021 showed widely patent LAD and RCA stents without significant restenosis.  There was stable 70% ostial ramus stenosis and otherwise no obstructive disease along with mildly reduced LV systolic function and mildly elevated LVEDP.  Echo on 11/10/2021 showed ***.  Upper extremity ultrasound was ***.  ***   Labs independently reviewed: 10/2021 - A1c 5.9, TSH normal 09/2021 - HGB 13.9, PLT 272, BUN 13, SCr 0.84, potassium 4.1 03/2021 - TC 170, TG 50, HDL 68, LDL 92, albumin 4.8, AST/ALT normal,    Past Medical History:  Diagnosis Date   Acute ST elevation myocardial infarction (STEMI) of inferior wall ([place]) 07/18/2017   CAD (coronary artery disease)    a. 03/2016 MV: EF 55-65%, low risk; b. 06/2017 Inf STEMI: LM 85d, LAD 60p, RCA 99ost (3.25x18 Xience [place] DES/3.0x38 [place] DES), RPAV 60; c. 08/2017 PCI dLM/LAD (3.0x38 Xience [place] DES); d. 10/2017 Cath: LM/prox  LAD patent, LAD 51m 40d, RI 70, LCX 40p, RCA patent stent. EF 50-55; e. 08/2019 MV: Low risk EF 55-65%.   Contrast media allergy    GERD (gastroesophageal reflux disease)    Hemorrhoid    Hyperlipidemia    Hypertension    Ischemic cardiomyopathy    a. 06/2017 Echo: EF 45-50%, mod inf/infsept HK. Mildly dil LA; b. 09/2019 Echo: EF 45-50%, Gr2 DD. Mild MR.   [place]    a. 01/2019 ABIs and Duplex: ABI R 0.78, L 0.79; TBI R  0.66, L 1.11. RCIA >50p, RSFA 50-74. LCIA >50p, LSFA 75-99p, 30-49d. 3 vessel runoff bilat.   PA[facility]oxysmal [facility]) [facility]    a. Brief episode during December hospitalization-->converted on IV amio-->not on [place]   Stroke ([facility] 2011   "2 MINI STROKES". no deficits.   Unstable angina ([facility]     Past Surgical History:  Procedure Laterality Date   ANAL FISTULECTOMY N/A 12/21/2014   Procedure: FISTULECTOMY ANAL;  Surgeon: SChristene Lye MD;  Location: [facility] ORS;  Service: General;  Laterality: N/A;   APPENDECTOMY  1975   CARDIAC CATHETERIZATION     COLONOSCOPY WITH PROPOFOL N/A 02/12/2019   Procedure: COLONOSCOPY WITH PROPOFOL;  Surgeon: WLucilla Lame MD;  Location: [facility] ENDOSCOPY;  Service: Endoscopy;  Laterality: N/A;   CORONARY STENT INTERVENTION N/A 09/09/2017   Procedure: CORONARY STENT INTERVENTION;  Surgeon: AWellington Hampshire MD;  [place];  Service: Cardiovascular;  Laterality: N/A;   CORONARY/GRAFT ACUTE MI REVASCULARIZATION N/A 07/18/2017   Procedure: Coronary/Graft Acute MI Revascularization;  Surgeon: AWellington Hampshire MD;  [place] [facility];  Service: Cardiovascular;  Laterality: N/A;   HEMORRHOID SURGERY N/A 12/21/2014   Procedure: HEMORRHOIDECTOMY;  Surgeon: SChristene Lye MD;  Location: [facility] ORS;  Service: General;  Laterality: N/A;   INTRAVASCULAR ULTRASOUND/IVUS N/A 09/09/2017   Procedure: Intravascular Ultrasound/IVUS;  Surgeon: AWellington Hampshire MD;  [facility];  Service: Cardiovascular;  Laterality: N/A;   KNEE SURGERY Right age 71  LEFT HEART CATH AND CORONARY ANGIOGRAPHY N/A 07/18/2017   Procedure: LEFT HEART CATH AND CORONARY ANGIOGRAPHY;  Surgeon: AWellington Hampshire MD;  [place] [facility];  Service: Cardiovascular;  Laterality: N/A;   LEFT HEART CATH AND CORONARY ANGIOGRAPHY Left 08/19/2017   Procedure: LEFT HEART CATH AND CORONARY ANGIOGRAPHY;  Surgeon: AWellington Hampshire MD;   [place] [facility];  Service: Cardiovascular;  Laterality: Left;   LEFT HEART CATH AND CORONARY ANGIOGRAPHY N/A 11/07/2017   Procedure: LEFT HEART CATH AND CORONARY ANGIOGRAPHY;  Surgeon: AWellington Hampshire MD;  [place] [facility];  Service: Cardiovascular;  Laterality: N/A;   LEFT HEART CATH AND CORONARY ANGIOGRAPHY N/A 10/17/2021   Procedure: LEFT HEART CATH AND CORONARY ANGIOGRAPHY;  Surgeon: AWellington Hampshire MD;  [place];  Service: Cardiovascular;  Laterality: N/A;   ROTATOR CUFF REPAIR Right 2013    Current Medications: No outpatient medications have been marked as taking for the 11/13/21 encounter (Appointment) with DRise Mu PA-C.    Allergies:   Codeine, Shellfish allergy, Contrast media [iodinated contrast media], and Fish allergy   Social History   Socioeconomic History   Marital status: Married    Spouse name: Not on file   Number of children: 2   Years of education: Not on file   Highest education level: 11th grade  Occupational History   Occupation: Retired  Tobacco  Use   Smoking status: Former    Types: Cigarettes    Quit date: 07/24/2007    Years since quitting: 14.3   Smokeless tobacco: Never   Tobacco comments:    smoking cessation materials not required  Vaping Use   Vaping Use: Never used  Substance and Sexual Activity   Alcohol use: Yes    Alcohol/week: 5.0 - 6.0 standard drinks    Types: 5 - 6 Cans of beer per week    Comment:  weekly   Drug use: No   Sexual activity: Not Currently  Other Topics Concern   Not on file  Social History Narrative   Lives in [place]. Retired.  Completed cardiac rehab program.   Social Determinants of Health   Financial Resource Strain: Low Risk    Difficulty of Paying Living Expenses: Not hard at all  Food Insecurity: No Food Insecurity   Worried About [place] in the Last Year: Never true   [place] in the Last Year: Never true  Transportation  Needs: No Transportation Needs   Lack of Transportation (Medical): No   Lack of Transportation (Non-Medical): No  Physical Activity: Sufficiently Active   Days of Exercise per Week: 3 days   Minutes of Exercise per Session: 60 min  Stress: No Stress Concern Present   Feeling of Stress : Only a little  Social Connections: Moderately Integrated   Frequency of Communication with Friends and Family: More than three times a week   Frequency of Social Gatherings with Friends and Family: Three times a week   Attends Religious Services: More than 4 times per year   Active Member of Clubs or Organizations: No   Attends Archivist Meetings: Never   Marital Status: Married     Family History:  The patient's family history includes Alzheimer's disease in his father and mother; Hypertension in his father and mother.  ROS:   ROS   EKGs/Labs/Other Studies Reviewed:    Studies reviewed were summarized above. The additional studies were reviewed today:  LHC 10/17/2021:   Ost Ramus lesion is 70% stenosed.   Mid LAD lesion is 20% stenosed.   Prox Cx lesion is 30% stenosed.   Non-stenotic Prox LAD lesion was previously treated.   Non-stenotic Ost RCA to Mid RCA lesion was previously treated.   There is mild left ventricular systolic dysfunction.   LV end diastolic pressure is mildly elevated.   The left ventricular ejection fraction is 45-50% by visual estimate.   1.  Widely patent RCA and LAD stents with no significant restenosis.  Stable 70% stenosis in ostial ramus.  No other obstructive disease. 2.  Mildly reduced LV systolic function and mildly elevated left ventricular end-diastolic pressure.   Recommendations: No culprit is identified for the patient's left arm pain.  Continue aggressive medical therapy for coronary artery disease. __________  Carlton Adam MPI 01/18/2021: T wave inversion was noted during stress in the V6, V5, II, III and aVF leads. There was no ST segment  deviation noted during stress. The study is normal. This is a low risk study. The left ventricular ejection fraction visually appears normal, although calculated value is mildly decreased at 47%. Correlation with echo advised. There is no evidence for ischemia. __________   ABIs 02/05/2020: Bilateral ABIs appear essentially unchanged compared to prior study on  01/26/19. ABIs and right TBI are consistent with previous exam. Drop in left  TBI but still normal.     Summary:  Right: Resting right ankle-brachial index indicates moderate right lower  extremity arterial disease. The right toe-brachial index is abnormal.   Left: Resting left ankle-brachial index indicates moderate left lower  extremity arterial disease. The left toe-brachial index is normal. __________   2D echo 09/29/2019: 1. Left ventricular ejection fraction, by estimation, is 45 to 50%. The  left ventricle has mildly decreased function. The left ventricle  demonstrates global hypokinesis. Left ventricular diastolic parameters are  consistent with Grade II diastolic  dysfunction (pseudonormalization). Elevated left atrial pressure.   2. Right ventricular systolic function is normal. The right ventricular  size is normal. Tricuspid regurgitation signal is inadequate for assessing  PA pressure.   3. The mitral valve is grossly normal. Mild mitral valve regurgitation.  No evidence of mitral stenosis.   4. The aortic valve is tricuspid. Aortic valve regurgitation is not  visualized. No aortic stenosis is present.   5. Mildly dilated pulmonary artery.   6. The inferior vena cava is normal in size with greater than 50%  respiratory variability, suggesting right atrial pressure of 3 mmHg. __________   2D echo 08/24/2019: There was no ST segment deviation noted during stress. The study is normal. This is a low risk study. The left ventricular ejection fraction is normal (55-65%). __________   LHC 11/07/2017: Mid LAD lesion  is 30% stenosed. Previously placed Prox LAD drug eluting stent is widely patent. Prox Cx lesion is 40% stenosed. Non-stenotic Ost RCA to Mid RCA lesion previously treated. Ost Ramus lesion is 70% stenosed. Dist LAD lesion is 40% stenosed. LV end diastolic pressure is mildly elevated. There is mild left ventricular systolic dysfunction. The left ventricular ejection fraction is 50-55% by visual estimate.   1.  Widely patent LAD and RCA stents with no significant restenosis.  There is a relatively small size ramus br which is pinched by the LAD stent with 70% ostial stenosis.  No other obstructive disease. 2.  Low normal LV systolic function with an EF of 50-55% with mild inferior wall hypokinesis.  Mildly elevated left ventricular end-diastolic pressure.   Recommendations: Continue medical therapy.  Add sublingual nitroglycerin to be used as needed. The patient can be discharged home from a cardiac standpoint. __________   [facility] 09/09/2017: Dist LM to Ost LAD lesion is 85% stenosed. Prox LAD lesion is 60% stenosed. Mid LAD lesion is 30% stenosed. Prox Cx lesion is 40% stenosed. Non-stenotic Ost RCA to Mid RCA lesion previously treated. Ost Ramus lesion is 50% stenosed. Post intervention, there is a 0% residual stenosis. Post intervention, there is a 0% residual stenosis. A drug-eluting stent was successfully placed using a STENT [place] 3.00 X 38 MM.   Successful IVUS guided drug-eluting stent placement to the proximal and ostial LAD with one long stent.   Recommendations: Dual antiplatelet therapy for at least one year.  Aggressive treatment of risk factors. __________   LHC 08/19/2017: Dist LM to Ost LAD lesion is 85% stenosed. Prox LAD lesion is 60% stenosed. Mid LAD lesion is 30% stenosed. Prox Cx lesion is 40% stenosed. Previously placed Ost RCA to Mid RCA drug eluting stent and stent (unknown type) is widely patent. Balloon angioplasty was performed. Ost Ramus lesion is 50%  stenosed. There is mild left ventricular systolic dysfunction. LV end diastolic pressure is normal. The left ventricular ejection fraction is 45-50% by visual estimate.   1.  Significant underlying two-vessel coronary artery disease with widely patent stents in the right coronary artery without any restenosis.  Significant  ostial LAD stenosis is unchanged with moderate disease in the proximal and mid segment. 2.  Mildly reduced LV systolic function with an EF of 45% with inferior wall hypokinesis.  Normal left ventricular end-diastolic pressure.   Recommendations: Continue medical therapy for now.  I will discuss with my colleagues about management options of ostial LAD stenosis with PCI versus one-vessel CABG. __________   2D echo 07/18/2017: - Left ventricle: The cavity size was normal. There was mild    concentric hypertrophy. Systolic function was mildly reduced. The    estimated ejection fraction was in the range of 45% to 50%.    Moderate hypokinesis of the inferior and inferoseptal myocardium.    The study was not technically sufficient to allow evaluation of    LV diastolic dysfunction due to atrial fibrillation.  - Left atrium: The atrium was mildly dilated. __________   [facility] 09/18/2016: Ost RCA to Mid RCA lesion is 99% stenosed. A drug-eluting stent was successfully placed using a STENT [place] 3.00 X 38 MM. Post intervention, there is a 0% residual stenosis. A stent was successfully placed. Post Atrio lesion is 60% stenosed. Dist LM to Ost LAD lesion is 85% stenosed. Prox Cx lesion is 40% stenosed. Prox LAD lesion is 60% stenosed. Mid LAD lesion is 40% stenosed.   1.  Significant two-vessel coronary artery disease with subtotal occlusion of the proximal right coronary artery which is the culprit for inferior ST elevation myocardial infarction.  There is also 85% ostial LAD stenosis.  There is a medium sized ramus branch with 60% ostial stenosis. 2.  Normal left ventricular  end-diastolic pressure. 3.  Successful complex angioplasty and 2 overlapped drug-eluting stent placement to the proximal and ostial right coronary artery.   Recommendations: The patient had hypotension throughout PCI likely due to RV involvement.  He responded to IV fluids and norepinephrine drip.  Continue dual antiplatelet therapy for at least one year.  Aggressive treatment of risk factors. Further ischemic evaluation of the LAD will be required in the near future.  Angiography was suboptimal given the patient's significant tachycardia. This was an overall difficult procedure due to long diffuse disease. __________   2D echo 04/09/2016: - Left ventricle: The cavity size was normal. Wall thickness was    normal. Systolic function was normal. The estimated ejection    fraction was in the range of 55% to 60%. Wall motion was normal;    there were no regional wall motion abnormalities. Left    ventricular diastolic function parameters were normal.   Impressions:   - Normal study. __________   Carl[facility] Adam MPI 04/06/2016: There was no ST segment deviation noted during stress. No T wave inversion was noted during stress. This is a low risk study. The left ventricular ejection fraction is normal (55-65%). Defect 1: There is a small defect of mild severity present in the apex location. This is patrially reversible and suspected to be due to an artifact given intense GI uptake which affected the quality of study. Correlate clinically  EKG:  EKG is ordered today.  The EKG ordered today demonstrates ***  Recent Labs: 04/13/2021: ALT 22 10/12/2021: BUN 13; Creatinine, Ser 0.84; Hemoglobin 13.9; Platelets 272; Potassium 4.1; Sodium 139 11/07/2021: TSH 2.390  Recent Lipid Panel    Component Value Date/Time   CHOL 170 04/13/2021 0858   TRIG 50 04/13/2021 0858   HDL 68 04/13/2021 0858   CHOLHDL 2.5 04/13/2021 0858   CHOLHDL 2.8 11/07/2017 0511   VLDL 4 11/07/2017 0511  LDLCALC 92 04/13/2021  0858   LDLDIRECT 116 (H) 09/24/2017 1338    PHYSICAL EXAM:    VS:  There were no vitals taken for this visit.  BMI: There is no height or weight on file to calculate BMI.  Physical Exam  Wt Readings from Last 3 Encounters:  11/07/21 182 lb (82.6 kg)  10/12/21 182 lb (82.6 kg)  09/06/21 183 lb (83 kg)     ASSESSMENT & PLAN:   CAD involving the native coronary arteries with***angina:  Chronic combined systolic and diastolic CHF secondary to ICM:  [place] Afib: Noted postprocedure following his STEMI complicated by VF arrest.  ***  HTN: Blood pressure ***  HLD: LDL 92 in 03/2021 with target LDL being less than 70.   {Are you ordering a CV Procedure (e.g. stress test, cath, DCCV, TEE, etc)?   Press F2        :[id number]}     Disposition: F/u with Dr. Fletcher Anon or an APP in ***.   Medication Adjustments/Labs and Tests Ordered: Current medicines are reviewed at length with the patient today.  Concerns regarding medicines are outlined above. Medication changes, Labs and Tests ordered today are summarized above and listed in the Patient Instructions accessible in Encounters.   Signed, [name], PA-C 11/08/2021 10:15 AM     [address] [place], [place] [address] [phone]

## 2021-11-09 ENCOUNTER — Other Ambulatory Visit: Payer: Self-pay | Admitting: Physician Assistant

## 2021-11-09 DIAGNOSIS — M79602 Pain in left arm: Secondary | ICD-10-CM

## 2021-11-10 ENCOUNTER — Other Ambulatory Visit: Payer: Medicare HMO

## 2021-11-13 ENCOUNTER — Ambulatory Visit: Payer: Medicare HMO | Admitting: Physician Assistant

## 2021-11-13 DIAGNOSIS — I10 Essential (primary) hypertension: Secondary | ICD-10-CM

## 2021-11-13 DIAGNOSIS — E785 Hyperlipidemia, unspecified: Secondary | ICD-10-CM

## 2021-11-13 DIAGNOSIS — I4891 Unspecified atrial fibrillation: Secondary | ICD-10-CM

## 2021-11-13 DIAGNOSIS — I25118 Atherosclerotic heart disease of native coronary artery with other forms of angina pectoris: Secondary | ICD-10-CM

## 2021-11-13 DIAGNOSIS — I255 Ischemic cardiomyopathy: Secondary | ICD-10-CM

## 2021-11-13 DIAGNOSIS — I5042 Chronic combined systolic (congestive) and diastolic (congestive) heart failure: Secondary | ICD-10-CM

## 2021-11-21 ENCOUNTER — Ambulatory Visit (INDEPENDENT_AMBULATORY_CARE_PROVIDER_SITE_OTHER): Payer: Medicare HMO

## 2021-11-21 ENCOUNTER — Telehealth: Payer: Self-pay | Admitting: Cardiovascular Disease

## 2021-11-21 DIAGNOSIS — M79602 Pain in left arm: Secondary | ICD-10-CM | POA: Diagnosis not present

## 2021-11-21 DIAGNOSIS — R200 Anesthesia of skin: Secondary | ICD-10-CM

## 2021-11-21 NOTE — Telephone Encounter (Signed)
Patient came into office today states he was told he has a blood clot & wants to discuss with Dr. Fletcher Anon, please assist. ?

## 2021-11-21 NOTE — Telephone Encounter (Signed)
Will fwd to Dr. Fletcher Anon. ?

## 2021-11-21 NOTE — Telephone Encounter (Signed)
Discussed with [name] who spoke with the patient. ?

## 2021-11-28 ENCOUNTER — Telehealth: Payer: Self-pay | Admitting: *Deleted

## 2021-11-28 ENCOUNTER — Ambulatory Visit (INDEPENDENT_AMBULATORY_CARE_PROVIDER_SITE_OTHER): Payer: Medicare HMO

## 2021-11-28 DIAGNOSIS — I4891 Unspecified atrial fibrillation: Secondary | ICD-10-CM | POA: Diagnosis not present

## 2021-11-28 DIAGNOSIS — M79602 Pain in left arm: Secondary | ICD-10-CM

## 2021-11-28 DIAGNOSIS — R079 Chest pain, unspecified: Secondary | ICD-10-CM

## 2021-11-28 DIAGNOSIS — I200 Unstable angina: Secondary | ICD-10-CM | POA: Diagnosis not present

## 2021-11-28 DIAGNOSIS — R200 Anesthesia of skin: Secondary | ICD-10-CM

## 2021-11-28 DIAGNOSIS — I25118 Atherosclerotic heart disease of native coronary artery with other forms of angina pectoris: Secondary | ICD-10-CM | POA: Diagnosis not present

## 2021-11-28 LAB — ECHOCARDIOGRAM COMPLETE
AR max vel: 1.63 cm2
AV Area VTI: 1.8 cm2
AV Area mean vel: 1.69 cm2
AV Mean grad: 6 mmHg
AV Peak grad: 12.3 mmHg
Ao pk vel: 1.75 m/s
Area-P 1/2: 4.12 cm2
Calc EF: 47.3 %
S' Lateral: 3.7 cm
Single Plane A2C EF: 46.3 %
Single Plane A4C EF: 46.3 %

## 2021-11-28 NOTE — Telephone Encounter (Signed)
Left voicemail message to call back for review of results and recommendations.  

## 2021-11-28 NOTE — Telephone Encounter (Signed)
-----   Message from Rise Mu, PA-C sent at 11/28/2021  1:21 PM EDT ----- ?Echo showed a pump function of 45 to 50%, global hypokinesis, mildly stiffened heart, normal right-sided pump function and size, no significant valvular abnormalities, and normal pressure within the right side of the heart. ? ?When compared to prior studies, his EF is unchanged and remains mildly reduced.  Stop amlodipine.  Start Entresto 24/26 mg twice daily (has not been on ACE inhibitor, and is not currently on ARB).  Follow-up BMP 1 week after starting Entresto.  Follow-up as planned. ?

## 2021-12-01 NOTE — Telephone Encounter (Signed)
Attempted to call the patient. No answer- I left a message to please call back.  

## 2021-12-04 ENCOUNTER — Other Ambulatory Visit: Payer: Self-pay | Admitting: Cardiovascular Disease

## 2021-12-05 ENCOUNTER — Other Ambulatory Visit: Payer: Self-pay

## 2021-12-05 MED ORDER — SACUBITRIL-VALSARTAN 24-26 MG PO TABS: 1.0000 | ORAL_TABLET | Freq: Two times a day (BID) | ORAL | 3 refills | Status: DC

## 2021-12-05 MED ORDER — SACUBITRIL-VALSARTAN 24-26 MG PO TABS: 1.0000 | ORAL_TABLET | Freq: Two times a day (BID) | ORAL | 11 refills | Status: DC

## 2021-12-05 NOTE — Telephone Encounter (Signed)
Spoke with patient and reviewed results and recommendations. He verbalized understanding to stop amlodipine and start Entresto 24-26 mg twice a day. Encouraged him to search on phone for free 30 day coupon and that if he brings income statements then we can process patient assistance application at his upcoming appointment. Given that he has appointment in exactly one week we can draw labs at that visit. He verbalized understanding of our conversation, agreement with plan, and had no further questions at this time.  ? ?Patient assistance application completed for his upcoming appointment. Instructed him to bring in both social security statements for him and his wife to that appointment. Reviewed that I will have him sign application at that time as well. He was appreciative for the assistance.  ?

## 2021-12-08 ENCOUNTER — Encounter: Payer: Self-pay | Admitting: Physician Assistant

## 2021-12-08 NOTE — Telephone Encounter (Signed)
Error. No encounter needed

## 2021-12-11 NOTE — Progress Notes (Unsigned)
Cardiology Office Note    Date:  12/13/2021   ID:  Todd Weaver, Todd Weaver 03/04/1951, MRN [id number]  PCP:  Glean Hess, MD  Cardiologist:  Kathlyn Sacramento, MD  Electrophysiologist:  None   Chief Complaint: Follow-up  History of Present Illness:   Todd Weaver is a 71 y.o. male with history of CAD with inferior ST elevation MI complicated by ventricular fibrillation arrest in 06/2017 status post PCI/DES with overlapping drug-eluting stent placement to the RCA and A-fib post procedure status post staged IVUS guided DES to the proximal and ostial LAD in 08/2017, chronic combined systolic and diastolic CHF, CVA, PAD, HTN, HLD, prior tobacco use, and BPH who presents for follow-up of LHC and echo.   He was admitted in 06/2017 with an inferior ST elevation MI complicated by ventricular fibrillation arrest.  He underwent successful PCI/DES with 2 overlapping drug-eluting stents to the RCA.  LHC at that time also demonstrated significant ostial LAD stenosis, borderline significant proximal LAD stenosis, and moderate distal LAD stenosis.  LVEF was mildly reduced.  Postprocedure, he had A-fib that was successfully treated with amiodarone.  He underwent staged IVUS guided PCI/DES to the ostial and proximal LAD in 08/2017.  Repeat LHC in 10/2017 showed widely patent LAD and RCA stents with no significant restenosis.  The ramus branch was pinched by the LAD stent with 70% ostial stenosis.  LVEF was 50 to 55%.   He has known PAD that has been managed medically.  Noninvasive vascular studies in 01/2019 demonstrated  moderately reduced ABI in the 0.7 range bilaterally with diffuse moderate SFA disease worse on the left side.  He had repeat ABI done in 01/2020 which was stable overall. Nuclear stress test in 08/2019 showed no evidence of ischemia with normal ejection fraction.  Echo in 09/2019 showed an EF of 45 to 50% with mild mitral regurgitation.   He was seen in the office by Dr. Rockey Situ on 01/05/2021, and  noted some left-sided chest discomfort that radiated into his left arm.  Symptoms were typically noted when he was laying in the bed, laying on his left side.  These episodes felt similar to his prior angina.  He was without exertional symptoms.  He underwent Lexiscan MPI on 01/18/2021 which was overall low risk and showed no evidence of ischemia.  LVEF appeared visually normal, though calculated value was mildly reduced at 47%.  He was last seen in the office on 10/12/2021 noting progression of left arm and shoulder discomfort as well as 1 episode of profound exhaustion with associated nausea after climbing a flight of stairs.  Symptoms did not feel exactly similar to his prior angina.  He was without significant swelling of the upper extremity.  Diagnostic LHC on 10/17/2021 demonstrated widely patent RCA and LAD stents with no significant restenosis.  Stable 70% stenosis in the ostial ramus.  Otherwise, no obstructive disease.  Mildly reduced LV systolic function and mildly elevated LVEDP were noted.  Medical therapy was recommended.  Subsequent left upper extremity venous duplex demonstrated a chronic thrombus involving one of the left brachial veins.  Case was discussed with the patient's primary cardiologist with no intervention indicated.  Echo on 11/28/2021 demonstrated an EF of 45 to 50%, global hypokinesis, grade 2 diastolic dysfunction, normal RV systolic function and ventricular cavity size, no significant valvular abnormalities, and an estimated right atrial pressure of 3 mmHg.  He comes in today continuing to note left arm pain that is typically worse when at  rest and improves with movement.  Arm pain is most noticeable at nighttime.  He denies any prior trauma or recent IV/PICC line to the left upper extremity.  No known family history of blood clots.  No chest pain, dyspnea, dizziness, presyncope, or syncope.  He does note some palpitations if he is exerting himself.  No lower extremity swelling or  orthopnea.  He has tolerated the addition of Entresto without issues.   Labs independently reviewed: 10/2021 - A1c 5.9, TSH normal 09/2021 - Hgb 13.9, PLT 272, BUN 13, SCr 0.84, potassium 4.1 03/2021 - TC 170, TG 50, HDL 68, LDL 92, A1c 6.0, albumin 4.8, AST/ALT normal   Past Medical History:  Diagnosis Date   Acute ST elevation myocardial infarction (STEMI) of inferior wall ([place]) 07/18/2017   BPH (benign prostatic hyperplasia) 06/19/2017   Carpal tunnel syndrome of right wrist 12/26/2020   Chest pain 2/50/5397   Chronic systolic congestive heart failure ([place]) 09/29/2020   Contrast media allergy    Coronary artery disease of native artery of native heart with stable angina pectoris ([place]) 10/03/2018   cardiac catheterization April 2019 showed widely patent LAD and RCA stent with no significant restenosis.  The ramus branch was pinched by the LAD stent with 70% ostial stenosis.  EF was 50 to 55%  12/2020: Myoview: T wave inversion was noted during stress in the V6, V5, II, III and aVF  leads.   There was no ST segment deviation noted during stress.   The study is normal.   This is a low risk s   ED (erectile dysfunction) 06/19/2017   Essential hypertension 06/19/2017   GERD (gastroesophageal reflux disease)    Hand dermatitis 07/26/2014   Hemorrhoid    History of CVA (cerebrovascular accident) 06/19/2017   History of ST elevation myocardial infarction (STEMI) 07/18/2017   07/18/2017 s/p PCI/DES x 2 to the RCA with residual disease involving the distal left main/ostial LAD, proximal LAD, mid LAD, and LCx      Hyperlipidemia    Hypertension    Impingement syndrome of left shoulder region 10/14/2018   Ischemic cardiomyopathy    a. 06/2017 Echo: EF 45-50%, mod inf/infsept HK. Mildly dil LA; b. 09/2019 Echo: EF 45-50%, Gr2 DD. Mild MR.   Lumbar spondylosis with myelopathy 11/08/2016   MCI (mild cognitive impairment) 06/19/2017   Mixed hyperlipidemia 06/19/2017       Neck muscle spasm 12/26/2020    Overweight (BMI 25.0-29.9) 06/19/2017   PAD (peripheral artery disease) ([place])    a. 01/2019 ABIs and Duplex: ABI R 0.78, L 0.79; TBI R 0.66, L 1.11. RCIA >50p, RSFA 50-74. LCIA >50p, LSFA 75-99p, 30-49d. 3 vessel runoff bilat.   PAF (paroxysmal atrial fibrillation) ([place])    a. Brief episode during December hospitalization-->converted on IV amio-->not on Lydia.   Rectal polyp    Stroke [facility]) 2011   "2 MINI STROKES". no deficits.   Thrombophilia ([place]) 09/29/2020   Urinary hesitancy 12/26/2020    Past Surgical History:  Procedure Laterality Date   ANAL FISTULECTOMY N/A 12/21/2014   Procedure: FISTULECTOMY ANAL;  Surgeon: Christene Lye, MD;  Location: [facility] ORS;  Service: General;  Laterality: N/A;   APPENDECTOMY  1975   CARDIAC CATHETERIZATION     COLONOSCOPY WITH PROPOFOL N/A 02/12/2019   Procedure: COLONOSCOPY WITH PROPOFOL;  Surgeon: Lucilla Lame, MD;  Location: [facility] ENDOSCOPY;  Service: Endoscopy;  Laterality: N/A;   CORONARY STENT INTERVENTION N/A 09/09/2017   Procedure: CORONARY STENT INTERVENTION;  Surgeon: Fletcher Anon,  Mertie Clause, MD;  Location: [place] [facility] LAB;  Service: Cardiovascular;  Laterality: N/A;   CORONARY/GRAFT ACUTE MI REVASCULARIZATION N/A 07/18/2017   Procedure: Coronary/Graft Acute MI Revascularization;  Surgeon: Wellington Hampshire, MD;  Location: [place] [facility];  Service: Cardiovascular;  Laterality: N/A;   HEMORRHOID SURGERY N/A 12/21/2014   Procedure: HEMORRHOIDECTOMY;  Surgeon: Christene Lye, MD;  Location: [facility] ORS;  Service: General;  Laterality: N/A;   INTRAVASCULAR ULTRASOUND/IVUS N/A 09/09/2017   Procedure: Intravascular Ultrasound/IVUS;  Surgeon: Wellington Hampshire, MD;  Location: [place] [facility];  Service: Cardiovascular;  Laterality: N/A;   KNEE SURGERY Right age 89   LEFT HEART CATH AND CORONARY ANGIOGRAPHY N/A 07/18/2017   Procedure: LEFT HEART CATH AND CORONARY ANGIOGRAPHY;  Surgeon: Wellington Hampshire, MD;  Location: [place] [facility];   Service: Cardiovascular;  Laterality: N/A;   LEFT HEART CATH AND CORONARY ANGIOGRAPHY Left 08/19/2017   Procedure: LEFT HEART CATH AND CORONARY ANGIOGRAPHY;  Surgeon: Wellington Hampshire, MD;  Location: [place] [facility];  Service: Cardiovascular;  Laterality: Left;   LEFT HEART CATH AND CORONARY ANGIOGRAPHY N/A 11/07/2017   Procedure: LEFT HEART CATH AND CORONARY ANGIOGRAPHY;  Surgeon: Wellington Hampshire, MD;  Location: [place] [facility];  Service: Cardiovascular;  Laterality: N/A;   LEFT HEART CATH AND CORONARY ANGIOGRAPHY N/A 10/17/2021   Procedure: LEFT HEART CATH AND CORONARY ANGIOGRAPHY;  Surgeon: Wellington Hampshire, MD;  Location: [place] [facility];  Service: Cardiovascular;  Laterality: N/A;   ROTATOR CUFF REPAIR Right 2013    Current Medications: Current Meds  Medication Sig   aspirin EC 81 MG tablet Take 81 mg by mouth daily.    atorvastatin (LIPITOR) 80 MG tablet TAKE 1 TABLET EVERY DAY AT 6:00PM   carvedilol (COREG) 3.125 MG tablet Take 1 tablet (3.125 mg total) by mouth 2 (two) times daily with a meal.   clopidogrel (PLAVIX) 75 MG tablet TAKE 1 TABLET EVERY DAY   diclofenac Sodium (VOLTAREN) 1 % GEL Apply 1 application. topically daily as needed (pain).   ezetimibe (ZETIA) 10 MG tablet TAKE 1 TABLET EVERY DAY   furosemide (LASIX) 20 MG tablet TAKE 1 TABLET EVERY DAY   nitroGLYCERIN (NITROSTAT) 0.4 MG SL tablet Place 1 tablet (0.4 mg total) under the tongue every 5 (five) minutes as needed for chest pain.   oxybutynin (DITROPAN-XL) 10 MG 24 hr tablet Take 1 tablet (10 mg total) by mouth at bedtime.   potassium chloride (KLOR-CON) 10 MEQ tablet TAKE 1 TABLET (10 MEQ TOTAL) BY MOUTH DAILY.   sacubitril-valsartan (ENTRESTO) 49-51 MG Take 1 tablet by mouth 2 (two) times daily.   sacubitril-valsartan (ENTRESTO) 49-51 MG Take 1 tablet by mouth 2 (two) times daily.   sildenafil (REVATIO) 20 MG tablet Take 1 tablet (20 mg total) by mouth 3 (three) times daily as needed.   tamsulosin  (FLOMAX) 0.4 MG CAPS capsule TAKE 1 CAPSULE (0.4 MG TOTAL) BY MOUTH DAILY.   [DISCONTINUED] sacubitril-valsartan (ENTRESTO) 24-26 MG Take 1 tablet by mouth 2 (two) times daily.   [DISCONTINUED] sacubitril-valsartan (ENTRESTO) 24-26 MG Take 1 tablet by mouth 2 (two) times daily.    Allergies:   Codeine, Shellfish allergy, Contrast media [iodinated contrast media], and Fish allergy   Social History   Socioeconomic History   Marital status: Married    Spouse name: Not on file   Number of children: 2   Years of education: Not on file   Highest education level: 11th grade  Occupational History   Occupation: Retired  Tobacco Use   Smoking status: Former    Types: Cigarettes    Quit date: 07/24/2007    Years since quitting: 14.4   Smokeless tobacco: Never   Tobacco comments:    smoking cessation materials not required  Vaping Use   Vaping Use: Never used  Substance and Sexual Activity   Alcohol use: Yes    Alcohol/week: 5.0 - 6.0 standard drinks    Types: 5 - 6 Cans of beer per week    Comment:  weekly   Drug use: No   Sexual activity: Not Currently  Other Topics Concern   Not on file  Social History Narrative   Lives in [place]. Retired.  Completed cardiac rehab program.   Social Determinants of Health   Financial Resource Strain: Not on file  Food Insecurity: Not on file  Transportation Needs: Not on file  Physical Activity: Not on file  Stress: Not on file  Social Connections: Not on file     Family History:  The patient's family history includes Alzheimer's disease in his father and mother; Hypertension in his father and mother.  ROS:   12-point review of systems is negative unless otherwise noted in the HPI.   EKGs/Labs/Other Studies Reviewed:    Studies reviewed were summarized above. The additional studies were reviewed today:  2D echo 11/28/2021: 1. Left ventricular ejection fraction, by estimation, is 45 to 50%. The  left ventricle has mildly decreased  function. The left ventricle  demonstrates global hypokinesis. Left ventricular diastolic parameters are  consistent with Grade II diastolic  dysfunction (pseudonormalization). The average left ventricular global  longitudinal strain is -17.5 %. The global longitudinal strain is normal.   2. Right ventricular systolic function is normal. The right ventricular  size is normal.   3. The mitral valve is normal in structure. No evidence of mitral valve  regurgitation. No evidence of mitral stenosis.   4. The aortic valve is normal in structure. Aortic valve regurgitation is  not visualized. No aortic stenosis is present.   5. The inferior vena cava is normal in size with greater than 50%  respiratory variability, suggesting right atrial pressure of 3 mmHg. __________  LHC 10/17/2021:   Ost Ramus lesion is 70% stenosed.   Mid LAD lesion is 20% stenosed.   Prox Cx lesion is 30% stenosed.   Non-stenotic Prox LAD lesion was previously treated.   Non-stenotic Ost RCA to Mid RCA lesion was previously treated.   There is mild left ventricular systolic dysfunction.   LV end diastolic pressure is mildly elevated.   The left ventricular ejection fraction is 45-50% by visual estimate.   1.  Widely patent RCA and LAD stents with no significant restenosis.  Stable 70% stenosis in ostial ramus.  No other obstructive disease. 2.  Mildly reduced LV systolic function and mildly elevated left ventricular end-diastolic pressure.   Recommendations: No culprit is identified for the patient's left arm pain.  Continue aggressive medical therapy for coronary artery disease. __________  Carlton Adam MPI 01/18/2021: T wave inversion was noted during stress in the V6, V5, II, III and aVF leads. There was no ST segment deviation noted during stress. The study is normal. This is a low risk study. The left ventricular ejection fraction visually appears normal, although calculated value is mildly decreased at 47%.  Correlation with echo advised. There is no evidence for ischemia. __________   ABIs 02/05/2020: Bilateral ABIs appear essentially unchanged compared to  prior study on  01/26/19. ABIs and right TBI are consistent with previous exam. Drop in left  TBI but still normal.     Summary:  Right: Resting right ankle-brachial index indicates moderate right lower  extremity arterial disease. The right toe-brachial index is abnormal.   Left: Resting left ankle-brachial index indicates moderate left lower  extremity arterial disease. The left toe-brachial index is normal. __________   2D echo 09/29/2019: 1. Left ventricular ejection fraction, by estimation, is 45 to 50%. The  left ventricle has mildly decreased function. The left ventricle  demonstrates global hypokinesis. Left ventricular diastolic parameters are  consistent with Grade II diastolic  dysfunction (pseudonormalization). Elevated left atrial pressure.   2. Right ventricular systolic function is normal. The right ventricular  size is normal. Tricuspid regurgitation signal is inadequate for assessing  PA pressure.   3. The mitral valve is grossly normal. Mild mitral valve regurgitation.  No evidence of mitral stenosis.   4. The aortic valve is tricuspid. Aortic valve regurgitation is not  visualized. No aortic stenosis is present.   5. Mildly dilated pulmonary artery.   6. The inferior vena cava is normal in size with greater than 50%  respiratory variability, suggesting right atrial pressure of 3 mmHg. __________   2D echo 08/24/2019: There was no ST segment deviation noted during stress. The study is normal. This is a low risk study. The left ventricular ejection fraction is normal (55-65%). __________   LHC 11/07/2017: Mid LAD lesion is 30% stenosed. Previously placed Prox LAD drug eluting stent is widely patent. Prox Cx lesion is 40% stenosed. Non-stenotic Ost RCA to Mid RCA lesion previously treated. Ost Ramus lesion is 70%  stenosed. Dist LAD lesion is 40% stenosed. LV end diastolic pressure is mildly elevated. There is mild left ventricular systolic dysfunction. The left ventricular ejection fraction is 50-55% by visual estimate.   1.  Widely patent LAD and RCA stents with no significant restenosis.  There is a relatively small size ramus br which is pinched by the LAD stent with 70% ostial stenosis.  No other obstructive disease. 2.  Low normal LV systolic function with an EF of 50-55% with mild inferior wall hypokinesis.  Mildly elevated left ventricular end-diastolic pressure.   Recommendations: Continue medical therapy.  Add sublingual nitroglycerin to be used as needed. The patient can be discharged home from a cardiac standpoint. __________   [facility] 09/09/2017: Dist LM to Ost LAD lesion is 85% stenosed. Prox LAD lesion is 60% stenosed. Mid LAD lesion is 30% stenosed. Prox Cx lesion is 40% stenosed. Non-stenotic Ost RCA to Mid RCA lesion previously treated. Ost Ramus lesion is 50% stenosed. Post intervention, there is a 0% residual stenosis. Post intervention, there is a 0% residual stenosis. A drug-eluting stent was successfully placed using a STENT SIERRA 3.00 X 38 MM.   Successful IVUS guided drug-eluting stent placement to the proximal and ostial LAD with one long stent.   Recommendations: Dual antiplatelet therapy for at least one year.  Aggressive treatment of risk factors. __________   LHC 08/19/2017: Dist LM to Ost LAD lesion is 85% stenosed. Prox LAD lesion is 60% stenosed. Mid LAD lesion is 30% stenosed. Prox Cx lesion is 40% stenosed. Previously placed Ost RCA to Mid RCA drug eluting stent and stent (unknown type) is widely patent. Balloon angioplasty was performed. Ost Ramus lesion is 50% stenosed. There is mild left ventricular systolic dysfunction. LV end diastolic pressure is normal. The left ventricular ejection fraction is 45-50%  by visual estimate.   1.  Significant  underlying two-vessel coronary artery disease with widely patent stents in the right coronary artery without any restenosis.  Significant ostial LAD stenosis is unchanged with moderate disease in the proximal and mid segment. 2.  Mildly reduced LV systolic function with an EF of 45% with inferior wall hypokinesis.  Normal left ventricular end-diastolic pressure.   Recommendations: Continue medical therapy for now.  I will discuss with my colleagues about management options of ostial LAD stenosis with PCI versus one-vessel CABG. __________   2D echo 07/18/2017: - Left ventricle: The cavity size was normal. There was mild    concentric hypertrophy. Systolic function was mildly reduced. The    estimated ejection fraction was in the range of 45% to 50%.    Moderate hypokinesis of the inferior and inferoseptal myocardium.    The study was not technically sufficient to allow evaluation of    LV diastolic dysfunction due to atrial fibrillation.  - Left atrium: The atrium was mildly dilated. __________   [facility] 09/18/2016: Ost RCA to Mid RCA lesion is 99% stenosed. A drug-eluting stent was successfully placed using a STENT SIERRA 3.00 X 38 MM. Post intervention, there is a 0% residual stenosis. A stent was successfully placed. Post Atrio lesion is 60% stenosed. Dist LM to Ost LAD lesion is 85% stenosed. Prox Cx lesion is 40% stenosed. Prox LAD lesion is 60% stenosed. Mid LAD lesion is 40% stenosed.   1.  Significant two-vessel coronary artery disease with subtotal occlusion of the proximal right coronary artery which is the culprit for inferior ST elevation myocardial infarction.  There is also 85% ostial LAD stenosis.  There is a medium sized ramus branch with 60% ostial stenosis. 2.  Normal left ventricular end-diastolic pressure. 3.  Successful complex angioplasty and 2 overlapped drug-eluting stent placement to the proximal and ostial right coronary artery.   Recommendations: The patient had  hypotension throughout PCI likely due to RV involvement.  He responded to IV fluids and norepinephrine drip.  Continue dual antiplatelet therapy for at least one year.  Aggressive treatment of risk factors. Further ischemic evaluation of the LAD will be required in the near future.  Angiography was suboptimal given the patient's significant tachycardia. This was an overall difficult procedure due to long diffuse disease. __________   2D echo 04/09/2016: - Left ventricle: The cavity size was normal. Wall thickness was    normal. Systolic function was normal. The estimated ejection    fraction was in the range of 55% to 60%. Wall motion was normal;    there were no regional wall motion abnormalities. Left    ventricular diastolic function parameters were normal.   Impressions:   - Normal study. __________   Carlton Adam MPI 04/06/2016: There was no ST segment deviation noted during stress. No T wave inversion was noted during stress. This is a low risk study. The left ventricular ejection fraction is normal (55-65%). Defect 1: There is a small defect of mild severity present in the apex location. This is patrially reversible and suspected to be due to an artifact given intense GI uptake which affected the quality of study. Correlate clinically   EKG:  EKG is ordered today.  The EKG ordered today demonstrates sinus bradycardia, 55 bpm, prior inferior and anterior infarct, nonspecific ST-T changes  Recent Labs: 04/13/2021: ALT 22 10/12/2021: BUN 13; Creatinine, Ser 0.84; Hemoglobin 13.9; Platelets 272; Potassium 4.1; Sodium 139 11/07/2021: TSH 2.390  Recent Lipid Panel  Component Value Date/Time   CHOL 170 04/13/2021 0858   TRIG 50 04/13/2021 0858   HDL 68 04/13/2021 0858   CHOLHDL 2.5 04/13/2021 0858   CHOLHDL 2.8 11/07/2017 0511   VLDL 4 11/07/2017 0511   LDLCALC 92 04/13/2021 0858   LDLDIRECT 116 (H) 09/24/2017 1338    PHYSICAL EXAM:    VS:  BP 140/72   Pulse (!) 55   Ht 5'  7" (1.702 m)   Wt 181 lb 6.4 oz (82.3 kg)   SpO2 99%   BMI 28.41 kg/m   BMI: Body mass index is 28.41 kg/m.  Physical Exam Constitutional:      Appearance: He is well-developed.  HENT:     Head: Normocephalic and atraumatic.  Eyes:     General:        Right eye: No discharge.        Left eye: No discharge.  Neck:     Vascular: No JVD.  Cardiovascular:     Rate and Rhythm: Regular rhythm. Bradycardia present.     Pulses:          Carotid pulses are 2+ on the right side and 2+ on the left side.      Posterior tibial pulses are 2+ on the right side and 2+ on the left side.     Heart sounds: Normal heart sounds, S1 normal and S2 normal. Heart sounds not distant. No midsystolic click and no opening snap. No murmur heard.   No friction rub.  Pulmonary:     Effort: Pulmonary effort is normal. No respiratory distress.     Breath sounds: Normal breath sounds. No decreased breath sounds, wheezing or rales.  Chest:     Chest wall: No tenderness.  Abdominal:     General: There is no distension.     Palpations: Abdomen is soft.     Tenderness: There is no abdominal tenderness.  Musculoskeletal:     Cervical back: Normal range of motion.     Right lower leg: No edema.     Left lower leg: No edema.  Skin:    General: Skin is warm and dry.     Nails: There is no clubbing.  Neurological:     Mental Status: He is alert and oriented to person, place, and time.  Psychiatric:        Speech: Speech normal.        Behavior: Behavior normal.        Thought Content: Thought content normal.        Judgment: Judgment normal.    Wt Readings from Last 3 Encounters:  12/13/21 181 lb 6.4 oz (82.3 kg)  11/07/21 182 lb (82.6 kg)  10/12/21 182 lb (82.6 kg)     ASSESSMENT & PLAN:   CAD involving the native coronary arteries without angina: He is doing well without symptoms concerning for angina or decompensation.  Recent LHC demonstrated patent LAD and RCA stents with otherwise stable disease  involving the ostial ramus.  No intervention was indicated.  Continue aspirin, clopidogrel, atorvastatin, carvedilol, and ezetimibe.  Aggressive risk factor modification and secondary prevention.  No indication for further ischemic testing at this time.  Chronic combined systolic and diastolic CHF secondary to ICM: He appears euvolemic and well compensated.  Recent echo demonstrated a mild persistent cardiomyopathy leading to the initiation of Entresto.  Today, we will titrate Entresto to 49/51 mg twice daily.  He will otherwise remain on carvedilol 3.125 mg twice daily (bradycardia precludes  titration of beta-blocker at this time), along with low-dose of furosemide and KCl.  In follow-up, recommend further escalation of Entresto as tolerated along with addition of SGLT2 inhibitor and spironolactone as tolerated.  CHF education.  [place] A-fib: Noted following MI and VF arrest.  Place Zio patch to evaluate for further arrhythmia.  Not currently on anticoagulation given isolated episode in the context of significant acute illness.  Should recurrence be identified, [place] will need to be revisited at that time.  Chronic small left brachial DVT: It is unclear if this is contributing to his left arm pain or not.  We will refer him to vascular surgery for further input on potential intervention.  I have encouraged him to also discuss possible orthopedic referral with his PCP along with possible hematology referral for hypercoagulable work-up.  After discussion with MD, no current indication for anticoagulation.  HTN: Blood pressure is mildly elevated in the office today.  We are titrating Entresto as outlined above.  Otherwise, he remains on carvedilol and furosemide.  HLD: LDL 92 in 03/2021 with target LDL being less than 70.  He remains on atorvastatin 80 mg and ezetimibe.  Recheck fasting lipid panel at next visit.  If LDL remains above goal at that time, consider referral to the lipid clinic for consideration of  PCSK9 inhibitor.  Recent COVID illness: Appears to be recovering well.     Disposition: F/u with Dr. Fletcher Anon or an APP in 2 months.   Medication Adjustments/Labs and Tests Ordered: Current medicines are reviewed at length with the patient today.  Concerns regarding medicines are outlined above. Medication changes, Labs and Tests ordered today are summarized above and listed in the Patient Instructions accessible in Encounters.   Signed, [name], PA-C 12/13/2021 9:53 AM     [facility] [address] [place] [address] [place], [place] [address] [phone]

## 2021-12-12 DIAGNOSIS — E785 Hyperlipidemia, unspecified: Secondary | ICD-10-CM | POA: Insufficient documentation

## 2021-12-12 DIAGNOSIS — K649 Unspecified hemorrhoids: Secondary | ICD-10-CM | POA: Insufficient documentation

## 2021-12-12 DIAGNOSIS — Z91041 Radiographic dye allergy status: Secondary | ICD-10-CM | POA: Insufficient documentation

## 2021-12-12 DIAGNOSIS — K219 Gastro-esophageal reflux disease without esophagitis: Secondary | ICD-10-CM | POA: Insufficient documentation

## 2021-12-12 DIAGNOSIS — I10 Essential (primary) hypertension: Secondary | ICD-10-CM | POA: Insufficient documentation

## 2021-12-12 DIAGNOSIS — I255 Ischemic cardiomyopathy: Secondary | ICD-10-CM | POA: Insufficient documentation

## 2021-12-13 ENCOUNTER — Encounter: Payer: Self-pay | Admitting: Physician Assistant

## 2021-12-13 ENCOUNTER — Ambulatory Visit (INDEPENDENT_AMBULATORY_CARE_PROVIDER_SITE_OTHER): Payer: Medicare HMO

## 2021-12-13 ENCOUNTER — Other Ambulatory Visit
Admission: RE | Admit: 2021-12-13 | Discharge: 2021-12-13 | Disposition: A | Discharge status: Home / Self Care | Payer: Medicare HMO | Attending: Physician Assistant | Admitting: Physician Assistant

## 2021-12-13 ENCOUNTER — Ambulatory Visit: Payer: Medicare HMO | Admitting: Physician Assistant

## 2021-12-13 VITALS — BP 140/72 | HR 55 | Ht 67.0 in | Wt 181.4 lb

## 2021-12-13 DIAGNOSIS — I82729 Chronic embolism and thrombosis of deep veins of unspecified upper extremity: Secondary | ICD-10-CM | POA: Insufficient documentation

## 2021-12-13 DIAGNOSIS — R5383 Other fatigue: Secondary | ICD-10-CM | POA: Diagnosis not present

## 2021-12-13 DIAGNOSIS — E785 Hyperlipidemia, unspecified: Secondary | ICD-10-CM | POA: Diagnosis not present

## 2021-12-13 DIAGNOSIS — I5042 Chronic combined systolic (congestive) and diastolic (congestive) heart failure: Secondary | ICD-10-CM | POA: Diagnosis not present

## 2021-12-13 DIAGNOSIS — I25118 Atherosclerotic heart disease of native coronary artery with other forms of angina pectoris: Secondary | ICD-10-CM | POA: Insufficient documentation

## 2021-12-13 DIAGNOSIS — I2510 Atherosclerotic heart disease of native coronary artery without angina pectoris: Secondary | ICD-10-CM | POA: Diagnosis not present

## 2021-12-13 DIAGNOSIS — I739 Peripheral vascular disease, unspecified: Secondary | ICD-10-CM | POA: Insufficient documentation

## 2021-12-13 DIAGNOSIS — I4891 Unspecified atrial fibrillation: Secondary | ICD-10-CM

## 2021-12-13 DIAGNOSIS — I255 Ischemic cardiomyopathy: Secondary | ICD-10-CM

## 2021-12-13 DIAGNOSIS — I10 Essential (primary) hypertension: Secondary | ICD-10-CM

## 2021-12-13 LAB — BASIC METABOLIC PANEL
Anion gap: 8 (ref 5–15)
BUN: 10 mg/dL (ref 8–23)
CO2: 28 mmol/L (ref 22–32)
Calcium: 9.5 mg/dL (ref 8.9–10.3)
Chloride: 102 mmol/L (ref 98–111)
Creatinine, Ser: 0.94 mg/dL (ref 0.61–1.24)
GFR, Estimated: >60 mL/min (ref >60)
Glucose, Bld: 106 mg/dL — ABNORMAL HIGH (ref 70–99)
Potassium: 4.1 mmol/L (ref 3.5–5.1)
Sodium: 138 mmol/L (ref 135–145)

## 2021-12-13 MED ORDER — SACUBITRIL-VALSARTAN 49-51 MG PO TABS: 1.0000 | ORAL_TABLET | Freq: Two times a day (BID) | ORAL | 3 refills | Status: DC

## 2021-12-13 MED ORDER — ENTRESTO 49-51 MG PO TABS: 1.0000 | ORAL_TABLET | Freq: Two times a day (BID) | ORAL | 11 refills | Status: DC

## 2021-12-13 NOTE — Patient Instructions (Signed)
Medication Instructions:  Your physician has recommended you make the following change in your medication:   INCREASE Entresto 49/51 mg twice a day  *If you need a refill on your cardiac medications before your next appointment, please call your pharmacy*   Lab Work: BMET today over at the [facility] entrance and check in at registration.   If you have labs (blood work) drawn today and your tests are completely normal, you will receive your results only by: [place] (if you have MyChart) OR A paper copy in the mail If you have any lab test that is abnormal or we need to change your treatment, we will call you to review the results.   Testing/Procedures: Your provider has ordered a heart monitor to wear for 14 days. This will be mailed to your home with instructions on placement. Once you have finished the time frame requested, you will return monitor in box provided.      Follow-Up: At [facility], you and your health needs are our priority.  As part of our continuing mission to provide you with exceptional heart care, we have created designated Provider Care Teams.  These Care Teams include your primary Cardiologist (physician) and Advanced Practice Providers (APPs -  Physician Assistants and Nurse Practitioners) who all work together to provide you with the care you need, when you need it.   Your next appointment:   2 month(s)  The format for your next appointment:   In Person  Provider:   Kathlyn Sacramento, MD or [name], PA-C    Other Instructions Referral to vascular surgeon for chronic brachial DVT.   Important Information About Sugar

## 2021-12-14 NOTE — Telephone Encounter (Signed)
Patient dropped off his & wife SS statement, placed in box.

## 2021-12-15 NOTE — Telephone Encounter (Signed)
Application with income documentation completed and faxed to company. Filed in Samples closet.

## 2021-12-19 ENCOUNTER — Ambulatory Visit: Payer: Medicare HMO | Admitting: Urology

## 2021-12-19 ENCOUNTER — Encounter: Payer: Self-pay | Admitting: Urology

## 2021-12-19 VITALS — BP 146/80 | HR 56 | Ht 67.0 in | Wt 182.0 lb

## 2021-12-19 DIAGNOSIS — N138 Other obstructive and reflux uropathy: Secondary | ICD-10-CM | POA: Diagnosis not present

## 2021-12-19 DIAGNOSIS — N401 Enlarged prostate with lower urinary tract symptoms: Secondary | ICD-10-CM

## 2021-12-19 DIAGNOSIS — N3281 Overactive bladder: Secondary | ICD-10-CM

## 2021-12-19 DIAGNOSIS — R351 Nocturia: Secondary | ICD-10-CM

## 2021-12-19 DIAGNOSIS — Z125 Encounter for screening for malignant neoplasm of prostate: Secondary | ICD-10-CM

## 2021-12-19 LAB — BLADDER SCAN AMB NON-IMAGING

## 2021-12-19 MED ORDER — OXYBUTYNIN CHLORIDE ER 10 MG PO TB24: 10.0000 mg | ORAL_TABLET | Freq: Every day | ORAL | 3 refills | Status: DC

## 2021-12-19 MED ORDER — TAMSULOSIN HCL 0.4 MG PO CAPS: 0.4000 mg | ORAL_CAPSULE | Freq: Every day | ORAL | 3 refills | Status: DC

## 2021-12-19 NOTE — Progress Notes (Signed)
   12/19/2021 8:32 AM   Todd Weaver 09/13/1950 [id number]  Reason for visit: Follow up overactive bladder, nocturia, BPH, PSA screening  HPI: 71 year old male with extensive cardiac history and primary urinary complaints of frequency, urgency, nocturia 3-4 times per night.  PVRs have been normal, including normal at 98 mL today.  PSA was normal at 1.8 in September 2022, and urinalysis was benign.  He is currently on both Flomax and oxybutynin, and feels that his symptoms are well controlled on these medications.  He denies significant urinary complaints during the day, and urgency/frequency has improved.  He has nocturia 3 times overnight, but he is drinking 2-3 beers in the evening before bed.  We had a long conversation about behavioral strategies.  He denies any lower extremity edema.  He does snore, but has not been tested for sleep apnea, and I encouraged him to be evaluated.  We reviewed the connection between sleep apnea and nocturia.  We again reviewed the AUA guidelines regarding PSA screening every 1 to 2 years through age 29.  Especially with his significant cardiac history, I would recommend discontinuing screening at age 88 per the guideline recommendations.  -Continue Flomax and oxybutynin, refilled -Information provided regarding sleep apnea evaluation -RTC 1 year PSA, PVR.  If PSA normal likely discontinue screening  Billey Co, MD  [facility] [address] [place], [place] [address] [phone]

## 2021-12-19 NOTE — Addendum Note (Signed)
Addended by: Despina Hidden on: 12/19/2021 09:02 AM   Modules accepted: Orders

## 2021-12-20 ENCOUNTER — Other Ambulatory Visit: Payer: Self-pay | Admitting: Internal Medicine

## 2021-12-20 ENCOUNTER — Ambulatory Visit (INDEPENDENT_AMBULATORY_CARE_PROVIDER_SITE_OTHER): Payer: Medicare HMO

## 2021-12-20 DIAGNOSIS — Z0000 Encounter for general adult medical examination without abnormal findings: Secondary | ICD-10-CM | POA: Diagnosis not present

## 2021-12-20 NOTE — Patient Instructions (Signed)
Todd Weaver , Thank you for taking time to come for your Medicare Wellness Visit. I appreciate your ongoing commitment to your health goals. Please review the following plan we discussed and let me know if I can assist you in the future.   Screening recommendations/referrals: Colonoscopy: done 02/12/19. Repeat 01/2029 Recommended yearly ophthalmology/optometry visit for glaucoma screening and checkup Recommended yearly dental visit for hygiene and checkup  Vaccinations: Influenza vaccine: done 04/13/21 Pneumococcal vaccine: done 12/09/19 Tdap vaccine: done 06/19/17 Shingles vaccine: Shingrix discussed. Please contact your pharmacy for coverage information.  Covid-19: done 10/01/19 & 10/27/19  Advanced directives: Advance directive discussed with you today. I have provided a copy for you to complete at home and have notarized. Once this is complete please bring a copy in to our office so we can scan it into your chart.   Conditions/risks identified: recommend increasing activity as tolerated  Next appointment: Follow up in one year for your annual wellness visit.   Preventive Care 19 Years and Older, Male Preventive care refers to lifestyle choices and visits with your health care provider that can promote health and wellness. What does preventive care include? A yearly physical exam. This is also called an annual well check. Dental exams once or twice a year. Routine eye exams. Ask your health care provider how often you should have your eyes checked. Personal lifestyle choices, including: Daily care of your teeth and gums. Regular physical activity. Eating a healthy diet. Avoiding tobacco and drug use. Limiting alcohol use. Practicing safe sex. Taking low doses of aspirin every day. Taking vitamin and mineral supplements as recommended by your health care provider. What happens during an annual well check? The services and screenings done by your health care provider during your annual  well check will depend on your age, overall health, lifestyle risk factors, and family history of disease. Counseling  Your health care provider may ask you questions about your: Alcohol use. Tobacco use. Drug use. Emotional well-being. Home and relationship well-being. Sexual activity. Eating habits. History of falls. Memory and ability to understand (cognition). Work and work Statistician. Screening  You may have the following tests or measurements: Height, weight, and BMI. Blood pressure. Lipid and cholesterol levels. These may be checked every 5 years, or more frequently if you are over 63 years old. Skin check. Lung cancer screening. You may have this screening every year starting at age 67 if you have a 30-pack-year history of smoking and currently smoke or have quit within the past 15 years. Fecal occult blood test (FOBT) of the stool. You may have this test every year starting at age 53. Flexible sigmoidoscopy or colonoscopy. You may have a sigmoidoscopy every 5 years or a colonoscopy every 10 years starting at age 23. Prostate cancer screening. Recommendations will vary depending on your family history and other risks. Hepatitis C blood test. Hepatitis B blood test. Sexually transmitted disease (STD) testing. Diabetes screening. This is done by checking your blood sugar (glucose) after you have not eaten for a while (fasting). You may have this done every 1-3 years. Abdominal aortic aneurysm (AAA) screening. You may need this if you are a current or former smoker. Osteoporosis. You may be screened starting at age 95 if you are at high risk. Talk with your health care provider about your test results, treatment options, and if necessary, the need for more tests. Vaccines  Your health care provider may recommend certain vaccines, such as: Influenza vaccine. This is recommended every year. Tetanus,  diphtheria, and acellular pertussis (Tdap, Td) vaccine. You may need a Td booster  every 10 years. Zoster vaccine. You may need this after age 70. Pneumococcal 13-valent conjugate (PCV13) vaccine. One dose is recommended after age 70. Pneumococcal polysaccharide (PPSV23) vaccine. One dose is recommended after age 19. Talk to your health care provider about which screenings and vaccines you need and how often you need them. This information is not intended to replace advice given to you by your health care provider. Make sure you discuss any questions you have with your health care provider. Document Released: 08/05/2015 Document Revised: 03/28/2016 Document Reviewed: 05/10/2015 Elsevier Interactive Patient Education  2017 [employer] Prevention in the Home Falls can cause injuries. They can happen to people of all ages. There are many things you can do to make your home safe and to help prevent falls. What can I do on the outside of my home? Regularly fix the edges of walkways and driveways and fix any cracks. Remove anything that might make you trip as you walk through a door, such as a raised step or threshold. Trim any bushes or trees on the path to your home. Use bright outdoor lighting. Clear any walking paths of anything that might make someone trip, such as rocks or tools. Regularly check to see if handrails are loose or broken. Make sure that both sides of any steps have handrails. Any raised decks and porches should have guardrails on the edges. Have any leaves, snow, or ice cleared regularly. Use sand or salt on walking paths during winter. Clean up any spills in your garage right away. This includes oil or grease spills. What can I do in the bathroom? Use night lights. Install grab bars by the toilet and in the tub and shower. Do not use towel bars as grab bars. Use non-skid mats or decals in the tub or shower. If you need to sit down in the shower, use a plastic, non-slip stool. Keep the floor dry. Clean up any water that spills on the floor as soon  as it happens. Remove soap buildup in the tub or shower regularly. Attach bath mats securely with double-sided non-slip rug tape. Do not have throw rugs and other things on the floor that can make you trip. What can I do in the bedroom? Use night lights. Make sure that you have a light by your bed that is easy to reach. Do not use any sheets or blankets that are too big for your bed. They should not hang down onto the floor. Have a firm chair that has side arms. You can use this for support while you get dressed. Do not have throw rugs and other things on the floor that can make you trip. What can I do in the kitchen? Clean up any spills right away. Avoid walking on wet floors. Keep items that you use a lot in easy-to-reach places. If you need to reach something above you, use a strong step stool that has a grab bar. Keep electrical cords out of the way. Do not use floor polish or wax that makes floors slippery. If you must use wax, use non-skid floor wax. Do not have throw rugs and other things on the floor that can make you trip. What can I do with my stairs? Do not leave any items on the stairs. Make sure that there are handrails on both sides of the stairs and use them. Fix handrails that are broken or loose. Make  sure that handrails are as long as the stairways. Check any carpeting to make sure that it is firmly attached to the stairs. Fix any carpet that is loose or worn. Avoid having throw rugs at the top or bottom of the stairs. If you do have throw rugs, attach them to the floor with carpet tape. Make sure that you have a light switch at the top of the stairs and the bottom of the stairs. If you do not have them, ask someone to add them for you. What else can I do to help prevent falls? Wear shoes that: Do not have high heels. Have rubber bottoms. Are comfortable and fit you well. Are closed at the toe. Do not wear sandals. If you use a stepladder: Make sure that it is fully  opened. Do not climb a closed stepladder. Make sure that both sides of the stepladder are locked into place. Ask someone to hold it for you, if possible. Clearly mark and make sure that you can see: Any grab bars or handrails. First and last steps. Where the edge of each step is. Use tools that help you move around (mobility aids) if they are needed. These include: Canes. Walkers. Scooters. Crutches. Turn on the lights when you go into a dark area. Replace any light bulbs as soon as they burn out. Set up your furniture so you have a clear path. Avoid moving your furniture around. If any of your floors are uneven, fix them. If there are any pets around you, be aware of where they are. Review your medicines with your doctor. Some medicines can make you feel dizzy. This can increase your chance of falling. Ask your doctor what other things that you can do to help prevent falls. This information is not intended to replace advice given to you by your health care provider. Make sure you discuss any questions you have with your health care provider. Document Released: 05/05/2009 Document Revised: 12/15/2015 Document Reviewed: 08/13/2014 Elsevier Interactive Patient Education  2017 [employer].

## 2021-12-20 NOTE — Progress Notes (Signed)
Subjective:   Todd Weaver is a 71 y.o. male who presents for Medicare Annual/Subsequent preventive examination.  Virtual Visit via Telephone Note  I connected with  Todd Weaver on 12/20/21 at  1:30 PM EDT by telephone and verified that I am speaking with the correct person using two identifiers.  Location: Patient: home Provider: [facility] Persons participating in the virtual visit: [place]   I discussed the limitations, risks, security and privacy concerns of performing an evaluation and management service by telephone and the availability of in person appointments. The patient expressed understanding and agreed to proceed.  Interactive audio and video telecommunications were attempted between this nurse and patient, however failed, due to patient having technical difficulties OR patient did not have access to video capability.  We continued and completed visit with audio only.  Some vital signs may be absent or patient reported.   Clemetine Marker, LPN   Review of Systems     Cardiac Risk Factors include: advanced age (>40mn, >>18women);dyslipidemia;male gender;hypertension     Objective:    Today's Vitals   12/20/21 1338  PainSc: 7    There is no height or weight on file to calculate BMI.     12/20/2021    1:42 PM 12/12/2020    1:43 PM 12/05/2020   11:53 AM 12/13/2019    9:20 AM 12/09/2019    1:45 PM 08/14/2019    7:19 AM 02/12/2019    9:33 AM  Advanced Directives  Does Patient Have a Medical Advance Directive? No No No No No No No  Would patient like information on creating a medical advance directive? Yes (MAU/Ambulatory/Procedural Areas - Information given) Yes (MAU/Ambulatory/Procedural Areas - Information given) No - Patient declined  Yes (MAU/Ambulatory/Procedural Areas - Information given)  No - Patient declined    Current Medications (verified) Outpatient Encounter Medications as of 12/20/2021  Medication Sig   aspirin EC 81 MG tablet Take  81 mg by mouth daily.    atorvastatin (LIPITOR) 80 MG tablet TAKE 1 TABLET EVERY DAY AT 6:00PM   carvedilol (COREG) 3.125 MG tablet Take 1 tablet (3.125 mg total) by mouth 2 (two) times daily with a meal.   clopidogrel (PLAVIX) 75 MG tablet TAKE 1 TABLET EVERY DAY   diclofenac Sodium (VOLTAREN) 1 % GEL Apply 1 application. topically daily as needed (pain).   ezetimibe (ZETIA) 10 MG tablet TAKE 1 TABLET EVERY DAY   furosemide (LASIX) 20 MG tablet TAKE 1 TABLET EVERY DAY   nitroGLYCERIN (NITROSTAT) 0.4 MG SL tablet Place 1 tablet (0.4 mg total) under the tongue every 5 (five) minutes as needed for chest pain.   oxybutynin (DITROPAN-XL) 10 MG 24 hr tablet Take 1 tablet (10 mg total) by mouth at bedtime.   potassium chloride (KLOR-CON) 10 MEQ tablet TAKE 1 TABLET (10 MEQ TOTAL) BY MOUTH DAILY.   sacubitril-valsartan (ENTRESTO) 49-51 MG Take 1 tablet by mouth 2 (two) times daily.   sacubitril-valsartan (ENTRESTO) 49-51 MG Take 1 tablet by mouth 2 (two) times daily.   sildenafil (REVATIO) 20 MG tablet Take 1 tablet (20 mg total) by mouth 3 (three) times daily as needed.   tamsulosin (FLOMAX) 0.4 MG CAPS capsule Take 1 capsule (0.4 mg total) by mouth daily.   No facility-administered encounter medications on file as of 12/20/2021.    Allergies (verified) Codeine, Shellfish allergy, Contrast media [iodinated contrast media], and Fish allergy   History: Past Medical History:  Diagnosis Date   Acute ST elevation  myocardial infarction (STEMI) of inferior wall ([place]) 07/18/2017   BPH (benign prostatic hyperplasia) 06/19/2017   Carpal tunnel syndrome of right wrist 12/26/2020   Chest pain 2/83/1517   Chronic systolic congestive heart failure ([place]) 09/29/2020   Contrast media allergy    Coronary artery disease of native artery of native heart with stable angina pectoris ([place]) 10/03/2018   cardiac catheterization April 2019 showed widely patent LAD and RCA stent with no significant restenosis.  The ramus  branch was pinched by the LAD stent with 70% ostial stenosis.  EF was 50 to 55%  12/2020: Myoview: T wave inversion was noted during stress in the V6, V5, II, III and aVF  leads.   There was no ST segment deviation noted during stress.   The study is normal.   This is a low risk s   ED (erectile dysfunction) 06/19/2017   Essential hypertension 06/19/2017   GERD (gastroesophageal reflux disease)    Hand dermatitis 07/26/2014   Hemorrhoid    History of CVA (cerebrovascular accident) 06/19/2017   History of ST elevation myocardial infarction (STEMI) 07/18/2017   07/18/2017 s/p PCI/DES x 2 to the RCA with residual disease involving the distal left main/ostial LAD, proximal LAD, mid LAD, and LCx      Hyperlipidemia    Hypertension    Impingement syndrome of left shoulder region 10/14/2018   Ischemic cardiomyopathy    a. 06/2017 Echo: EF 45-50%, mod inf/infsept HK. Mildly dil LA; b. 09/2019 Echo: EF 45-50%, Gr2 DD. Mild MR.   Lumbar spondylosis with myelopathy 11/08/2016   MCI (mild cognitive impairment) 06/19/2017   Mixed hyperlipidemia 06/19/2017       Neck muscle spasm 12/26/2020   Overweight (BMI 25.0-29.9) 06/19/2017   PAD (peripheral artery disease) ([place])    a. 01/2019 ABIs and Duplex: ABI R 0.78, L 0.79; TBI R 0.66, L 1.11. RCIA >50p, RSFA 50-74. LCIA >50p, LSFA 75-99p, 30-49d. 3 vessel runoff bilat.   PAF (paroxysmal atrial fibrillation) ([place])    a. Brief episode during December hospitalization-->converted on IV amio-->not on [place].   Rectal polyp    Stroke [facility]) 2011   "2 MINI STROKES". no deficits.   Thrombophilia ([place]) 09/29/2020   Urinary hesitancy 12/26/2020   Past Surgical History:  Procedure Laterality Date   ANAL FISTULECTOMY N/A 12/21/2014   Procedure: FISTULECTOMY ANAL;  Surgeon: Christene Lye, MD;  Location: [facility] ORS;  Service: General;  Laterality: N/A;   APPENDECTOMY  1975   CARDIAC CATHETERIZATION     COLONOSCOPY WITH PROPOFOL N/A 02/12/2019   Procedure: COLONOSCOPY  WITH PROPOFOL;  Surgeon: Lucilla Lame, MD;  Location: [facility] ENDOSCOPY;  Service: Endoscopy;  Laterality: N/A;   CORONARY STENT INTERVENTION N/A 09/09/2017   Procedure: CORONARY STENT INTERVENTION;  Surgeon: Wellington Hampshire, MD;  Location: [place] CV LAB;  Service: Cardiovascular;  Laterality: N/A;   CORONARY/GRAFT ACUTE MI REVASCULARIZATION N/A 07/18/2017   Procedure: Coronary/Graft Acute MI Revascularization;  Surgeon: Wellington Hampshire, MD;  Location: [place] CV LAB;  Service: Cardiovascular;  Laterality: N/A;   HEMORRHOID SURGERY N/A 12/21/2014   Procedure: HEMORRHOIDECTOMY;  Surgeon: Christene Lye, MD;  Location: [facility] ORS;  Service: General;  Laterality: N/A;   INTRAVASCULAR ULTRASOUND/IVUS N/A 09/09/2017   Procedure: Intravascular Ultrasound/IVUS;  Surgeon: Wellington Hampshire, MD;  Location: [place] CV LAB;  Service: Cardiovascular;  Laterality: N/A;   KNEE SURGERY Right age 43   LEFT HEART CATH AND CORONARY ANGIOGRAPHY N/A 07/18/2017   Procedure: LEFT HEART CATH  AND CORONARY ANGIOGRAPHY;  Surgeon: Wellington Hampshire, MD;  Location: [place] CV LAB;  Service: Cardiovascular;  Laterality: N/A;   LEFT HEART CATH AND CORONARY ANGIOGRAPHY Left 08/19/2017   Procedure: LEFT HEART CATH AND CORONARY ANGIOGRAPHY;  Surgeon: Wellington Hampshire, MD;  Location: [place] CV LAB;  Service: Cardiovascular;  Laterality: Left;   LEFT HEART CATH AND CORONARY ANGIOGRAPHY N/A 11/07/2017   Procedure: LEFT HEART CATH AND CORONARY ANGIOGRAPHY;  Surgeon: Wellington Hampshire, MD;  Location: [place] CV LAB;  Service: Cardiovascular;  Laterality: N/A;   LEFT HEART CATH AND CORONARY ANGIOGRAPHY N/A 10/17/2021   Procedure: LEFT HEART CATH AND CORONARY ANGIOGRAPHY;  Surgeon: Wellington Hampshire, MD;  Location: [place] CV LAB;  Service: Cardiovascular;  Laterality: N/A;   ROTATOR CUFF REPAIR Right 2013   Family History  Problem Relation Age of Onset   Hypertension Mother     Alzheimer's disease Mother    Hypertension Father    Alzheimer's disease Father    Social History   Socioeconomic History   Marital status: Married    Spouse name: Not on file   Number of children: 2   Years of education: Not on file   Highest education level: 11th grade  Occupational History   Occupation: Retired  Tobacco Use   Smoking status: Former    Types: Cigarettes    Quit date: 07/24/2007    Years since quitting: 14.4    Passive exposure: Past   Smokeless tobacco: Never   Tobacco comments:    smoking cessation materials not required  Vaping Use   Vaping Use: Never used  Substance and Sexual Activity   Alcohol use: Yes    Alcohol/week: 5.0 - 6.0 standard drinks    Types: 5 - 6 Cans of beer per week    Comment:  weekly   Drug use: No   Sexual activity: Not Currently  Other Topics Concern   Not on file  Social History Narrative   Lives in [place]. Retired.  Completed cardiac rehab program.   Social Determinants of Health   Financial Resource Strain: Low Risk    Difficulty of Paying Living Expenses: Not hard at all  Food Insecurity: No Food Insecurity   Worried About [place] in the Last Year: Never true   [place] in the Last Year: Never true  Transportation Needs: No Transportation Needs   Lack of Transportation (Medical): No   Lack of Transportation (Non-Medical): No  Physical Activity: Inactive   Days of Exercise per Week: 0 days   Minutes of Exercise per Session: 0 min  Stress: No Stress Concern Present   Feeling of Stress : Not at all  Social Connections: Moderately Integrated   Frequency of Communication with Friends and Family: More than three times a week   Frequency of Social Gatherings with Friends and Family: Three times a week   Attends Religious Services: More than 4 times per year   Active Member of Clubs or Organizations: No   Attends Archivist Meetings: Never   Marital Status: Married    Tobacco  Counseling Counseling given: Not Answered Tobacco comments: smoking cessation materials not required   Clinical Intake:  Pre-visit preparation completed: Yes  Pain : 0-10 Pain Score: 7  Pain Type: Chronic pain Pain Location: Arm Pain Orientation: Left, Upper Pain Onset: More than a month ago Pain Frequency: Intermittent Effect of Pain on Daily Activities: causes numbness in fingers at night  Nutritional Risks: None Diabetes: No  How often do you need to have someone help you when you read instructions, pamphlets, or other written materials from your doctor or pharmacy?: 1 - Never   Interpreter Needed?: No  Information entered by :: Clemetine Marker LPN   Activities of Daily Living    12/20/2021    1:42 PM 11/07/2021    8:46 AM  In your present state of health, do you have any difficulty performing the following activities:  Hearing? 0 0  Vision? 0 0  Difficulty concentrating or making decisions? 0 0  Walking or climbing stairs? 0 0  Dressing or bathing? 0 0  Doing errands, shopping? 0 0  Preparing Food and eating ? N   Using the Toilet? N   In the past six months, have you accidently leaked urine? N   Do you have problems with loss of bowel control? N   Managing your Medications? N   Managing your Finances? N   Housekeeping or managing your Housekeeping? N     Patient Care Team: Glean Hess, MD as PCP - General (Internal Medicine) Billey Co, MD as Consulting Physician (Urology) Ree Edman, MD (Dermatology) Reche Dixon, PA-C (Orthopedic Surgery) Wellington Hampshire, MD as Consulting Physician (Cardiology)  Indicate any recent Medical Services you may have received from other than [facility] providers in the past year (date may be approximate).     Assessment:   This is a routine wellness examination for [place].  Hearing/Vision screen Hearing Screening - Comments:: Pt denies hearing difficulty  Vision Screening - Comments:: Annual vision  screenings [facility]  Dietary issues and exercise activities discussed: Current Exercise Habits: The patient does not participate in regular exercise at present, Exercise limited by: None identified   Goals Addressed   None    Depression Screen    12/20/2021    1:41 PM 11/07/2021    8:45 AM 09/06/2021    9:53 AM 04/13/2021    8:24 AM 12/26/2020    2:59 PM 12/12/2020    1:42 PM 09/29/2020    7:54 AM  PHQ 2/9 Scores  PHQ - 2 Score 0 0 0 0 0 0 0  PHQ- 9 Score '2 2 3 6 3 6 '$ 0    Fall Risk    12/20/2021    1:42 PM 11/07/2021    8:46 AM 09/06/2021    9:53 AM 04/13/2021    8:25 AM 12/26/2020    2:59 PM  Fall Risk   Falls in the past year? 0 0 0 0 0  Number falls in past yr: 0 0 0 0 0  Injury with Fall? 0 0 0 0 0  Risk for fall due to : No Fall Risks No Fall Risks No Fall Risks No Fall Risks   Follow up Falls prevention discussed Falls evaluation completed Falls evaluation completed Falls evaluation completed Falls evaluation completed    [place]:  Any stairs in or around the home? Yes  If so, are there any without handrails? No  Home free of loose throw rugs in walkways, pet beds, electrical cords, etc? Yes  Adequate lighting in your home to reduce risk of falls? Yes   ASSISTIVE DEVICES UTILIZED TO PREVENT FALLS:  Life alert? No  Use of a cane, walker or w/c? No  Grab bars in the bathroom? Yes  Shower chair or bench in shower? No  Elevated toilet seat or a handicapped toilet?  No   TIMED UP AND GO:  Was the test performed? No. Telephonic visit.   Cognitive Function: Normal cognitive status assessed by direct observation by this Nurse Health Advisor. No abnormalities found.          11/07/2021    8:43 AM 12/09/2019    1:47 PM 12/03/2018    9:44 AM  6CIT Screen  What Year? 0 points 0 points 0 points  What month? 0 points 0 points 0 points  What time? 0 points 0 points 0 points  Count back from 20 0 points 0 points 0  points  Months in reverse 0 points 0 points 0 points  Repeat phrase 6 points 2 points 4 points  Total Score 6 points 2 points 4 points    Immunizations Immunization History  Administered Date(s) Administered   Fluad Quad(high Dose 65+) 03/31/2019, 04/11/2020, 04/13/2021   Influenza,inj,Quad PF,6+ Mos 06/19/2017, 03/28/2018   PFIZER(Purple Top)SARS-COV-2 Vaccination 10/01/2019, 10/27/2019   Pneumococcal Conjugate-13 03/28/2018   Pneumococcal Polysaccharide-23 12/09/2019   Tdap 06/19/2017    TDAP status: Up to date  Flu Vaccine status: Up to date  Pneumococcal vaccine status: Up to date  Covid-19 vaccine status: Completed vaccines  Qualifies for Shingles Vaccine? Yes   Zostavax completed No   Shingrix Completed?: No.    Education has been provided regarding the importance of this vaccine. Patient has been advised to call insurance company to determine out of pocket expense if they have not yet received this vaccine. Advised may also receive vaccine at local pharmacy or Health Dept. Verbalized acceptance and understanding.  Screening Tests Health Maintenance  Topic Date Due   Zoster Vaccines- Shingrix (1 of 2) Never done   COVID-19 Vaccine (3 - Pfizer risk series) 11/24/2019   INFLUENZA VACCINE  02/20/2022   TETANUS/TDAP  06/20/2027   COLONOSCOPY (Pts 45-18yr Insurance coverage will need to be confirmed)  02/11/2029   Pneumonia Vaccine 71 Years old  Completed   Hepatitis C Screening  Completed   HPV VACCINES  Aged Out    Health Maintenance  Health Maintenance Due  Topic Date Due   Zoster Vaccines- Shingrix (1 of 2) Never done   COVID-19 Vaccine (3 - Pfizer risk series) 11/24/2019    Colorectal cancer screening: Type of screening: Colonoscopy. Completed 02/12/19. Repeat every 10 years  Lung Cancer Screening: (Low Dose CT Chest recommended if Age 69-80years, 30 pack-year currently smoking OR have quit w/in 15years.) does not qualify.   Additional  Screening:  Hepatitis C Screening: does qualify; Completed 03/28/18  Vision Screening: Recommended annual ophthalmology exams for early detection of glaucoma and other disorders of the eye. Is the patient up to date with their annual eye exam?  Yes  Who is the provider or what is the name of the [facility] which the patient [facility]   Dental Screening: Recommended annual dental exams for proper oral hygiene  Community Resource Referral / Chronic Care Management: CRR required this visit?  No   CCM required this visit?  No      Plan:     I have personally reviewed and noted the following in the patient's chart:   Medical and social history Use of alcohol, tobacco or illicit drugs  Current medications and supplements including opioid prescriptions. Patient is not currently taking opioid prescriptions. Functional ability and status Nutritional status Physical activity Advanced directives List of other physicians Hospitalizations, surgeries, and ER visits in previous 12 months Vitals Screenings  to include cognitive, depression, and falls Referrals and appointments  In addition, I have reviewed and discussed with patient certain preventive protocols, quality metrics, and best practice recommendations. A written personalized care plan for preventive services as well as general preventive health recommendations were provided to patient.     Clemetine Marker, LPN   6/78/9381   Nurse Notes: Pt states concerned about blood clot found in left arm causing intermittent pain in arm and numbness in fingers more so at night. Pt scheduled for follow up with Dr. Army Melia 12/28/21. Pt is currently wearing a heart monitor for 14 days starting on 12/18/21. See note below; no current vascular referral. Pt advised to contact cardiology or schedule earlier appt if sxs worsen or persist. Pt also recovering well from recent covid diagnosis approx 2 weeks ago.   From  cardiology on 12/13/21 - [name]  Chronic small left brachial DVT: It is unclear if this is contributing to his left arm pain or not.  We will refer him to vascular surgery for further input on potential intervention.  I have encouraged him to also discuss possible orthopedic referral with his PCP along with possible hematology referral for hypercoagulable work-up.  After discussion with MD, no current indication for anticoagulation.

## 2021-12-21 NOTE — Telephone Encounter (Signed)
Requested Prescriptions  Pending Prescriptions Disp Refills  . furosemide (LASIX) 20 MG tablet [Pharmacy Med Name: FUROSEMIDE 20 MG Tablet] 90 tablet 0    Sig: TAKE 1 TABLET EVERY DAY     Cardiovascular:  Diuretics - Loop Failed - 12/20/2021 10:20 AM      Failed - Mg Level in normal range and within 180 days    Magnesium  Date Value Ref Range Status  07/20/2017 1.9 1.7 - 2.4 mg/dL Final    Comment:    Performed at [facility], [address] [place], [place] [address]         Failed - Last BP in normal range    BP Readings from Last 1 Encounters:  12/19/21 (!) 146/80         Passed - K in normal range and within 180 days    Potassium  Date Value Ref Range Status  12/13/2021 4.1 3.5 - 5.1 mmol/L Final         Passed - Ca in normal range and within 180 days    Calcium  Date Value Ref Range Status  12/13/2021 9.5 8.9 - 10.3 mg/dL Final         Passed - Na in normal range and within 180 days    Sodium  Date Value Ref Range Status  12/13/2021 138 135 - 145 mmol/L Final  04/13/2021 143 134 - 144 mmol/L Final         Passed - Cr in normal range and within 180 days    Creatinine, Ser  Date Value Ref Range Status  12/13/2021 0.94 0.61 - 1.24 mg/dL Final         Passed - Cl in normal range and within 180 days    Chloride  Date Value Ref Range Status  12/13/2021 102 98 - 111 mmol/L Final         Passed - Valid encounter within last 6 months    Recent Outpatient Visits          1 month ago Essential hypertension   [place], MD   3 months ago [facility] Glean Hess, MD   8 months ago Annual physical exam   [facility] Glean Hess, MD   12 months ago Essential hypertension   [facility] Glean Hess, MD   1 year ago Essential hypertension   [facility] Glean Hess, MD      Future Appointments            In 1 week Army Melia Jesse Sans, MD [facility], [place]   In 1 month Dunn, Areta Haber, PA-C [facility]   In 3 months Army Melia, Jesse Sans, MD [facility], [place]   In 12 months Diamantina Providence, Herbert Seta, MD [place]           . atorvastatin (LIPITOR) 80 MG tablet [Pharmacy Med Name: ATORVASTATIN CALCIUM 80 MG Tablet] 90 tablet 0    Sig: TAKE 1 TABLET EVERY DAY AT 6:00PM     Cardiovascular:  Antilipid - Statins Failed - 12/20/2021 10:20 AM      Failed - Lipid Panel in normal range within the last 12 months    Cholesterol, Total  Date Value Ref Range Status  04/13/2021 170 100 - 199 mg/dL Final   LDL Chol Calc (NIH)  Date Value Ref Range Status  04/13/2021 92 0 -  99 mg/dL Final   LDL Direct  Date Value Ref Range Status  09/24/2017 116 (H) 0 - 99 mg/dL Final   HDL  Date Value Ref Range Status  04/13/2021 68 >39 mg/dL Final   Triglycerides  Date Value Ref Range Status  04/13/2021 50 0 - 149 mg/dL Final         Passed - Patient is not pregnant      Passed - Valid encounter within last 12 months    Recent Outpatient Visits          1 month ago Essential hypertension   [place], MD   3 months ago [facility] Glean Hess, MD   8 months ago Annual physical exam   [facility] Glean Hess, MD   12 months ago Essential hypertension   [facility] Glean Hess, MD   1 year ago Essential hypertension   [facility] Glean Hess, MD      Future Appointments            In 1 week Army Melia Jesse Sans, MD [facility], [place]   In 1 month Dunn, Areta Haber, PA-C [facility], [facility]   In 3 months Army Melia, Jesse Sans, MD [facility], [place]   In 12 months Diamantina Providence, Herbert Seta, [place]

## 2021-12-21 NOTE — Telephone Encounter (Signed)
Requested medication (s) are due for refill today: yes  Requested medication (s) are on the active medication list: yes    Last refill: 06/28/21  #90  0 refills  Future visit scheduled yes 12/28/21  Notes to clinic: Failed due to labs (Mag)  Please review, thank you.  Requested Prescriptions  Pending Prescriptions Disp Refills   furosemide (LASIX) 20 MG tablet [Pharmacy Med Name: FUROSEMIDE 20 MG Tablet] 90 tablet 0    Sig: TAKE 1 TABLET EVERY DAY     Cardiovascular:  Diuretics - Loop Failed - 12/20/2021 10:20 AM      Failed - Mg Level in normal range and within 180 days    Magnesium  Date Value Ref Range Status  07/20/2017 1.9 1.7 - 2.4 mg/dL Final    Comment:    Performed at [facility], [address] [place], [place] [address]         Failed - Last BP in normal range    BP Readings from Last 1 Encounters:  12/19/21 (!) 146/80         Passed - K in normal range and within 180 days    Potassium  Date Value Ref Range Status  12/13/2021 4.1 3.5 - 5.1 mmol/L Final         Passed - Ca in normal range and within 180 days    Calcium  Date Value Ref Range Status  12/13/2021 9.5 8.9 - 10.3 mg/dL Final         Passed - Na in normal range and within 180 days    Sodium  Date Value Ref Range Status  12/13/2021 138 135 - 145 mmol/L Final  04/13/2021 143 134 - 144 mmol/L Final         Passed - Cr in normal range and within 180 days    Creatinine, Ser  Date Value Ref Range Status  12/13/2021 0.94 0.61 - 1.24 mg/dL Final         Passed - Cl in normal range and within 180 days    Chloride  Date Value Ref Range Status  12/13/2021 102 98 - 111 mmol/L Final         Passed - Valid encounter within last 6 months    Recent Outpatient Visits           1 month ago Essential hypertension   [facility] Glean Hess, MD   3 months ago [facility] Glean Hess, MD   8 months ago Annual physical exam   [facility]  Glean Hess, MD   12 months ago Essential hypertension   [facility] Glean Hess, MD   1 year ago Essential hypertension   [facility] Glean Hess, MD       Future Appointments             In 1 week Army Melia Jesse Sans, MD [facility], [place]   In 1 month Dunn, Areta Haber, PA-C [facility], [facility]   In 3 months Army Melia, Jesse Sans, MD [facility], [place]   In 12 months Billey Co, MD [facility] [place]             Signed Prescriptions Disp Refills   atorvastatin (LIPITOR) 80 MG tablet 90 tablet 0    Sig: TAKE 1 TABLET EVERY DAY AT 6:00PM     Cardiovascular:  Antilipid - Statins Failed - 12/20/2021 10:20  AM      Failed - Lipid Panel in normal range within the last 12 months    Cholesterol, Total  Date Value Ref Range Status  04/13/2021 170 100 - 199 mg/dL Final   LDL Chol Calc (NIH)  Date Value Ref Range Status  04/13/2021 92 0 - 99 mg/dL Final   LDL Direct  Date Value Ref Range Status  09/24/2017 116 (H) 0 - 99 mg/dL Final   HDL  Date Value Ref Range Status  04/13/2021 68 >39 mg/dL Final   Triglycerides  Date Value Ref Range Status  04/13/2021 50 0 - 149 mg/dL Final         Passed - Patient is not pregnant      Passed - Valid encounter within last 12 months    Recent Outpatient Visits           1 month ago Essential hypertension   [place], MD   3 months ago [facility] Glean Hess, MD   8 months ago Annual physical exam   [facility] Glean Hess, MD   12 months ago Essential hypertension   [facility] Glean Hess, MD   1 year ago Essential hypertension   [facility] Glean Hess, MD       Future Appointments             In 1 week Army Melia Jesse Sans, MD [facility], [place]   In 1 month Dunn, Areta Haber, PA-C [facility], [facility]   In  3 months Army Melia, Jesse Sans, MD [facility], [place]   In 12 months Diamantina Providence, Herbert Seta, [place]

## 2021-12-28 ENCOUNTER — Ambulatory Visit (INDEPENDENT_AMBULATORY_CARE_PROVIDER_SITE_OTHER): Payer: Medicare HMO | Admitting: Internal Medicine

## 2021-12-28 ENCOUNTER — Encounter: Payer: Self-pay | Admitting: Internal Medicine

## 2021-12-28 VITALS — BP 110/72 | HR 58 | Ht 67.0 in | Wt 182.0 lb

## 2021-12-28 DIAGNOSIS — G5603 Carpal tunnel syndrome, bilateral upper limbs: Secondary | ICD-10-CM

## 2021-12-28 DIAGNOSIS — I82722 Chronic embolism and thrombosis of deep veins of left upper extremity: Secondary | ICD-10-CM | POA: Insufficient documentation

## 2021-12-28 NOTE — Progress Notes (Signed)
Date:  12/28/2021   Name:  Todd Weaver   DOB:  [birth year]   MRN:  [id number]   Chief Complaint: Hypertension and Arm Pain (X 3 months )  Arm Pain  Incident onset: 3 months. Incident location: found blood clot in left arm thru xray. The pain is present in the upper left arm. The quality of the pain is described as aching. The pain radiates to the left hand. The pain is at a severity of 8/10. The pain is mild. Pain course: comes and goes. Associated symptoms include numbness and tingling. Pertinent negatives include no chest pain. He has tried heat and acetaminophen for the symptoms. The treatment provided no relief.  09/2021: Subsequent left upper extremity venous duplex demonstrated a chronic thrombus involving one of the left brachial veins.  Case was discussed with the patient's primary cardiologist with no intervention indicated Shoulder Xray 03/2021: IMPRESSION: Minimal degenerative changes of the left glenohumeral joint. Mild acromioclavicular degenerative changes.  Numbness and tingling - he has noticed this in his left hand.  He had left arm pain that he thinks is from the DVT.  Recently the tingling is in his right hand as well.  It wakes him at night, he has to shake his hand or hand it off the bed.  No weakness has been noted.  DVT - in the left brachial vein.  No intervention was done at onset.  He is on chronic Plavix therapy. Cardiology suggested a hyper coagulable workup which has not been done.  Lab Results  Component Value Date   NA 138 12/13/2021   K 4.1 12/13/2021   CO2 28 12/13/2021   GLUCOSE 106 (H) 12/13/2021   BUN 10 12/13/2021   CREATININE 0.94 12/13/2021   CALCIUM 9.5 12/13/2021   EGFR 79 04/13/2021   GFRNONAA >60 12/13/2021   Lab Results  Component Value Date   CHOL 170 04/13/2021   HDL 68 04/13/2021   LDLCALC 92 04/13/2021   LDLDIRECT 116 (H) 09/24/2017   TRIG 50 04/13/2021   CHOLHDL 2.5 04/13/2021   Lab Results  Component Value Date   TSH 2.390  11/07/2021   Lab Results  Component Value Date   HGBA1C 5.9 (H) 11/07/2021   Lab Results  Component Value Date   WBC 6.0 10/12/2021   HGB 13.9 10/12/2021   HCT 42.3 10/12/2021   MCV 81.0 10/12/2021   PLT 272 10/12/2021   Lab Results  Component Value Date   ALT 22 04/13/2021   AST 25 04/13/2021   ALKPHOS 71 04/13/2021   BILITOT 1.1 04/13/2021   No results found for: "25OHVITD2", "25OHVITD3", "VD25OH"   Review of Systems  Constitutional:  Negative for chills, fatigue and fever.  Respiratory:  Negative for chest tightness and shortness of breath.   Cardiovascular:  Negative for chest pain and leg swelling.  Musculoskeletal:  Positive for myalgias. Negative for arthralgias.  Skin:  Negative for color change.  Neurological:  Positive for tingling and numbness. Negative for tremors and weakness.  Psychiatric/Behavioral:  Negative for dysphoric mood and sleep disturbance. The patient is not nervous/anxious.     Patient Active Problem List   Diagnosis Date Noted   Contrast media allergy 12/12/2021   GERD (gastroesophageal reflux disease) 12/12/2021   Hemorrhoid 12/12/2021   Hyperlipidemia 12/12/2021   Hypertension 12/12/2021   Ischemic cardiomyopathy 12/12/2021   Neck muscle spasm 12/26/2020   Urinary hesitancy 12/26/2020   Carpal tunnel syndrome of right wrist 02/54/2706   Chronic systolic congestive  heart failure ([place]) 09/29/2020   Thrombophilia ([place]) 09/29/2020   PAF (paroxysmal atrial fibrillation) ([place]) 04/11/2020   PAD (peripheral artery disease) ([place]) 03/31/2019   Rectal polyp    Impingement syndrome of left shoulder region 10/14/2018   Coronary artery disease of native artery of native heart with stable angina pectoris ([place]) 10/03/2018   Chest pain 11/06/2017   History of ST elevation myocardial infarction (STEMI) 07/18/2017   Acute ST elevation myocardial infarction (STEMI) of inferior wall ([place]) 07/18/2017   Essential hypertension 06/19/2017   Mixed  hyperlipidemia 06/19/2017   History of CVA (cerebrovascular accident) 06/19/2017   Overweight (BMI 25.0-29.9) 06/19/2017   BPH (benign prostatic hyperplasia) 06/19/2017   ED (erectile dysfunction) 06/19/2017   MCI (mild cognitive impairment) 06/19/2017   Lumbar spondylosis with myelopathy 11/08/2016   Hand dermatitis 07/26/2014   Stroke ([place]) 2011    Allergies  Allergen Reactions   Codeine Hives   Shellfish Allergy Hives and Swelling   Contrast Media [Iodinated Contrast Media] Itching and Other (See Comments)    Whelts on tongue   Fish Allergy Itching    Past Surgical History:  Procedure Laterality Date   ANAL FISTULECTOMY N/A 12/21/2014   Procedure: FISTULECTOMY ANAL;  Surgeon: Christene Lye, MD;  Location: [facility] ORS;  Service: General;  Laterality: N/A;   APPENDECTOMY  1975   CARDIAC CATHETERIZATION     COLONOSCOPY WITH PROPOFOL N/A 02/12/2019   Procedure: COLONOSCOPY WITH PROPOFOL;  Surgeon: Lucilla Lame, MD;  Location: [facility] ENDOSCOPY;  Service: Endoscopy;  Laterality: N/A;   CORONARY STENT INTERVENTION N/A 09/09/2017   Procedure: CORONARY STENT INTERVENTION;  Surgeon: Wellington Hampshire, MD;  Location: [place] [facility];  Service: Cardiovascular;  Laterality: N/A;   CORONARY/GRAFT ACUTE MI REVASCULARIZATION N/A 07/18/2017   Procedure: Coronary/Graft Acute MI Revascularization;  Surgeon: Wellington Hampshire, MD;  Location: [place] [facility];  Service: Cardiovascular;  Laterality: N/A;   HEMORRHOID SURGERY N/A 12/21/2014   Procedure: HEMORRHOIDECTOMY;  Surgeon: Christene Lye, MD;  Location: [facility] OR[facility];  Service: General;  Laterality: N/A;   INTRAVASCULAR ULTRASOUND/IVUS N/A 09/09/2017   Procedure: Intravascular Ultrasound/IVUS;  Surgeon: Wellington Hampshire, MD;  Location: [place] [facility];  Service: Cardiovascular;  Laterality: N/A;   KNEE SURGERY Right age 64   LEFT HEART CATH AND CORONARY ANGIOGRAPHY N/A 07/18/2017   Procedure: LEFT HEART CATH AND CORONARY  ANGIOGRAPHY;  Surgeon: Wellington Hampshire, MD;  Location: [place] [facility];  Service: Cardiovascular;  Laterality: N/A;   LEFT HEART CATH AND CORONARY ANGIOGRAPHY Left 08/19/2017   Procedure: LEFT HEART CATH AND CORONARY ANGIOGRAPHY;  Surgeon: Wellington Hampshire, MD;  Location: [place] [facility];  Service: Cardiovascular;  Laterality: Left;   LEFT HEART CATH AND CORONARY ANGIOGRAPHY N/A 11/07/2017   Procedure: LEFT HEART CATH AND CORONARY ANGIOGRAPHY;  Surgeon: Wellington Hampshire, MD;  Location: [place] [facility];  Service: Cardiovascular;  Laterality: N/A;   LEFT HEART CATH AND CORONARY ANGIOGRAPHY N/A 10/17/2021   Procedure: LEFT HEART CATH AND CORONARY ANGIOGRAPHY;  Surgeon: Wellington Hampshire, MD;  Location: [place] [facility];  Service: Cardiovascular;  Laterality: N/A;   ROTATOR CUFF REPAIR Right 2013    Social History   Tobacco Use   Smoking status: Former    Types: Cigarettes    Quit date: 07/24/2007    Years since quitting: 14.4    Passive exposure: Past   Smokeless tobacco: Never   Tobacco comments:    smoking cessation materials not required  Vaping Use   Vaping Use: Never used  Substance Use Topics   Alcohol use: Yes    Alcohol/week: 5.0 - 6.0 standard drinks of alcohol    Types: 5 - 6 Cans of beer per week    Comment:  weekly   Drug use: No     Medication list has been reviewed and updated.  Current Meds  Medication Sig   aspirin EC 81 MG tablet Take 81 mg by mouth daily.    atorvastatin (LIPITOR) 80 MG tablet TAKE 1 TABLET EVERY DAY AT 6:00PM   carvedilol (COREG) 3.125 MG tablet Take 1 tablet (3.125 mg total) by mouth 2 (two) times daily with a meal.   clopidogrel (PLAVIX) 75 MG tablet TAKE 1 TABLET EVERY DAY   diclofenac Sodium (VOLTAREN) 1 % GEL Apply 1 application. topically daily as needed (pain).   ezetimibe (ZETIA) 10 MG tablet TAKE 1 TABLET EVERY DAY   furosemide (LASIX) 20 MG tablet TAKE 1 TABLET EVERY DAY   nitroGLYCERIN (NITROSTAT) 0.4 MG SL  tablet Place 1 tablet (0.4 mg total) under the tongue every 5 (five) minutes as needed for chest pain.   oxybutynin (DITROPAN-XL) 10 MG 24 hr tablet Take 1 tablet (10 mg total) by mouth at bedtime.   potassium chloride (KLOR-CON) 10 MEQ tablet TAKE 1 TABLET (10 MEQ TOTAL) BY MOUTH DAILY.   sacubitril-valsartan (ENTRESTO) 49-51 MG Take 1 tablet by mouth 2 (two) times daily.   sacubitril-valsartan (ENTRESTO) 49-51 MG Take 1 tablet by mouth 2 (two) times daily.   sildenafil (REVATIO) 20 MG tablet Take 1 tablet (20 mg total) by mouth 3 (three) times daily as needed.   tamsulosin (FLOMAX) 0.4 MG CAPS capsule Take 1 capsule (0.4 mg total) by mouth daily.       12/28/2021    9:21 AM 11/07/2021    8:46 AM 09/06/2021    9:53 AM 04/13/2021    8:25 AM  GAD 7 : Generalized Anxiety Score  Nervous, Anxious, on Edge 0 0 0 0  Control/stop worrying 0 0 0 0  Worry too much - different things 0 0 0 0  Trouble relaxing _0 Restless 1 1 0 1  Easily annoyed or irritable 0 1 0 0  Afraid - awful might happen 0 0 0 0  Total GAD 7 Score _1 Anxiety Difficulty Not difficult at all Not difficult at all Not difficult at all        12/28/2021    9:21 AM  Depression screen PHQ 2/9  Decreased Interest 0  Down, Depressed, Hopeless 0  PHQ - 2 Score 0  Altered sleeping 3  Tired, decreased energy 1  Change in appetite 0  Feeling bad or failure about yourself  0  Trouble concentrating 0  Moving slowly or fidgety/restless 0  Suicidal thoughts 0  PHQ-9 Score 4  Difficult doing work/chores Not difficult at all    BP Readings from Last 3 Encounters:  12/28/21 110/72  12/19/21 (!) 146/80  12/13/21 140/72    Physical Exam Vitals and nursing note reviewed.  Constitutional:      General: He is not in acute distress.    Appearance: Normal appearance. He is well-developed.  HENT:     Head: Normocephalic and atraumatic.  Cardiovascular:     Rate and Rhythm: Normal rate and regular rhythm.  Pulmonary:      Effort: Pulmonary effort is normal. No respiratory distress.     Breath sounds:  No wheezing or rhonchi.  Musculoskeletal:     Right shoulder: No tenderness. Normal range of motion.     Left shoulder: No tenderness. Normal range of motion.     Right wrist: No tenderness. Normal range of motion. Normal pulse.     Left wrist: No tenderness. Normal range of motion. Normal pulse.     Right hand: Normal strength.     Left hand: Normal strength.     Cervical back: Normal range of motion.     Right lower leg: No edema.     Left lower leg: No edema.     Comments: Positive Tinel's bilaterally.   Skin:    General: Skin is warm and dry.     Findings: No rash.  Neurological:     Mental Status: He is alert and oriented to person, place, and time.  Psychiatric:        Mood and Affect: Mood normal.        Behavior: Behavior normal.     Wt Readings from Last 3 Encounters:  12/28/21 182 lb (82.6 kg)  12/19/21 182 lb (82.6 kg)  12/13/21 181 lb 6.4 oz (82.3 kg)    BP 110/72   Pulse (!) 58   Ht 5' 7" (1.702 m)   Wt 182 lb (82.6 kg)   SpO2 98%   BMI 28.51 kg/m   Assessment and Plan: 1. Carpal tunnel syndrome, bilateral This may be the source of his LUE upper arm discomfort, rather than the DVT Wear cock up wrist splints nightly. Take Tylenol 2 tabs bid. If no improvement in several weeks, call for referral.  2. Chronic deep vein thrombosis (DVT) of brachial vein of left upper extremity ([place]) Patient is advised that there is no treatment at this time for his DVT.  However, would like to prevent future events. Will obtain labs for hyper coagulability and advise. Refer to Hematology for further management if abnormal. - Antithrombin III - Prothrombin Gene Mutation - Factor 5 leiden - Protein C activity - Protein C, total - Protein S activity - Protein S, total   Partially dictated using Editor, commissioning. Any errors are unintentional.  Halina Maidens, MD [facility]  12/28/2021

## 2021-12-28 NOTE — Patient Instructions (Signed)
Wear splints on both wrists while sleeping.  Take Tylenol 2 tabs twice day.

## 2022-01-03 ENCOUNTER — Telehealth: Payer: Self-pay | Admitting: Physician Assistant

## 2022-01-03 NOTE — Telephone Encounter (Signed)
Left voicemail message to call back for review of patient assistance application. Called both home and mobile numbers.

## 2022-01-04 ENCOUNTER — Telehealth: Payer: Self-pay | Admitting: Physician Assistant

## 2022-01-04 NOTE — Telephone Encounter (Signed)
Patient reviewed lab results via My Chart. Closing this encounter.

## 2022-01-04 NOTE — Telephone Encounter (Signed)
Patient is calling to talk with Ryan Dunn or nurse. Please call back 

## 2022-01-04 NOTE — Telephone Encounter (Signed)
Spoke with patient and reviewed approval of his Delene Loll for free until 07/22/2022. Reviewed instructions that he must do for them to ship medication to him. He repeated number back and was very appreciative for the help with getting this medication. Instructed that I will send My Chart message with the detailed information. Advised if he should have any further questions to please call us back.

## 2022-01-05 DIAGNOSIS — I5042 Chronic combined systolic (congestive) and diastolic (congestive) heart failure: Secondary | ICD-10-CM | POA: Diagnosis not present

## 2022-01-05 DIAGNOSIS — I739 Peripheral vascular disease, unspecified: Secondary | ICD-10-CM | POA: Diagnosis not present

## 2022-01-05 DIAGNOSIS — I255 Ischemic cardiomyopathy: Secondary | ICD-10-CM | POA: Diagnosis not present

## 2022-01-05 DIAGNOSIS — I25118 Atherosclerotic heart disease of native coronary artery with other forms of angina pectoris: Secondary | ICD-10-CM | POA: Diagnosis not present

## 2022-01-05 LAB — PROTEIN C, TOTAL: Protein C Antigen: 120 % (ref 60–150)

## 2022-01-05 LAB — PROTHROMBIN GENE MUTATION

## 2022-01-05 LAB — FACTOR 5 LEIDEN

## 2022-01-05 LAB — ANTITHROMBIN III: AntiThromb III Func: 113 % (ref 75–135)

## 2022-01-05 LAB — PROTEIN S ACTIVITY: Protein S Activity: 79 % (ref 63–140)

## 2022-01-05 LAB — PROTEIN S, TOTAL: Protein S Ag, Total: 101 % (ref 60–150)

## 2022-01-05 LAB — PROTEIN C ACTIVITY: Protein C Activity: 123 % (ref 73–180)

## 2022-01-12 ENCOUNTER — Ambulatory Visit (INDEPENDENT_AMBULATORY_CARE_PROVIDER_SITE_OTHER): Payer: Medicare HMO | Admitting: Nurse Practitioner

## 2022-01-12 ENCOUNTER — Encounter (INDEPENDENT_AMBULATORY_CARE_PROVIDER_SITE_OTHER): Payer: Self-pay | Admitting: Nurse Practitioner

## 2022-01-12 VITALS — BP 132/76 | HR 59 | Resp 14 | Ht 67.0 in | Wt 180.0 lb

## 2022-01-12 DIAGNOSIS — G5603 Carpal tunnel syndrome, bilateral upper limbs: Secondary | ICD-10-CM

## 2022-01-12 DIAGNOSIS — I82722 Chronic embolism and thrombosis of deep veins of left upper extremity: Secondary | ICD-10-CM

## 2022-01-12 DIAGNOSIS — M79603 Pain in arm, unspecified: Secondary | ICD-10-CM | POA: Diagnosis not present

## 2022-01-25 ENCOUNTER — Other Ambulatory Visit: Payer: Self-pay | Admitting: Cardiovascular Disease

## 2022-01-27 ENCOUNTER — Encounter (INDEPENDENT_AMBULATORY_CARE_PROVIDER_SITE_OTHER): Payer: Self-pay | Admitting: Nurse Practitioner

## 2022-01-27 NOTE — Progress Notes (Signed)
Subjective:    Patient ID: Todd Weaver, male    DOB: 1951/04/30, 71 y.o.   MRN: [id number] Chief Complaint  Patient presents with   New Patient (Initial Visit)    Chronic brachial DVT    Todd Weaver is a 71 year old male who presents today as a referral from Mr. Idolina Primer, Hershal Coria in regards to chronic left brachial vein DVT and arm pain.  He notes that the pain is an aching sensation that radiates down to his hand.  He notes that the pain comes and goes.  It tends to be worse at rest and somewhat better with activity.  He also notes that he has begun to have some tingling in the right upper extremity as well.  There are currently no open wounds or ulcerations.  He denies any dizziness.    Review of Systems  Neurological:  Positive for numbness.  All other systems reviewed and are negative.      Objective:   Physical Exam Vitals reviewed.  HENT:     Head: Normocephalic.  Cardiovascular:     Rate and Rhythm: Normal rate.     Pulses:          Radial pulses are 2+ on the right side and 2+ on the left side.  Pulmonary:     Effort: Pulmonary effort is normal.  Skin:    General: Skin is warm and dry.  Neurological:     Mental Status: He is alert and oriented to person, place, and time.  Psychiatric:        Mood and Affect: Mood normal.        Behavior: Behavior normal.        Thought Content: Thought content normal.        Judgment: Judgment normal.     BP 132/76 (BP Location: Left Arm)   Pulse (!) 59   Resp 14   Ht '5\' 7"'$  (1.702 m)   Wt 180 lb (81.6 kg)   BMI 28.19 kg/m   Past Medical History:  Diagnosis Date   Acute ST elevation myocardial infarction (STEMI) of inferior wall (HCC) 07/18/2017   BPH (benign prostatic hyperplasia) 06/19/2017   Carpal tunnel syndrome of right wrist 12/26/2020   Chest pain 3/50/0938   Chronic systolic congestive heart failure ([place]) 09/29/2020   Contrast media allergy    Coronary artery disease of native artery of native heart with stable  angina pectoris ([place]) 10/03/2018   cardiac catheterization April 2019 showed widely patent LAD and RCA stent with no significant restenosis.  The ramus branch was pinched by the LAD stent with 70% ostial stenosis.  EF was 50 to 55%  12/2020: Myoview: T wave inversion was noted during stress in the V6, V5, II, III and aVF  leads.   There was no ST segment deviation noted during stress.   The study is normal.   This is a low risk s   ED (erectile dysfunction) 06/19/2017   Essential hypertension 06/19/2017   GERD (gastroesophageal reflux disease)    Hand dermatitis 07/26/2014   Hemorrhoid    History of CVA (cerebrovascular accident) 06/19/2017   History of ST elevation myocardial infarction (STEMI) 07/18/2017   07/18/2017 s/p PCI/DES x 2 to the RCA with residual disease involving the distal left main/ostial LAD, proximal LAD, mid LAD, and LCx      Hyperlipidemia    Hypertension    Impingement syndrome of left shoulder region 10/14/2018   Ischemic cardiomyopathy    a. 06/2017  Echo: EF 45-50%, mod inf/infsept HK. Mildly dil LA; b. 09/2019 Echo: EF 45-50%, Gr2 DD. Mild MR.   Lumbar spondylosis with myelopathy 11/08/2016   MCI (mild cognitive impairment) 06/19/2017   Mixed hyperlipidemia 06/19/2017       Neck muscle spasm 12/26/2020   Overweight (BMI 25.0-29.9) 06/19/2017   PAD (peripheral artery disease) ([place])    a. 01/2019 ABIs and Duplex: ABI R 0.78, L 0.79; TBI R 0.66, L 1.11. RCIA >50p, RSFA 50-74. LCIA >50p, LSFA 75-99p, 30-49d. 3 vessel runoff bilat.   PAF (paroxysmal atrial fibrillation) ([place])    a. Brief episode during December hospitalization-->converted on IV amio-->not on [place].   Rectal polyp    Stroke [facility]) 2011   "2 MINI STROKES". no deficits.   Thrombophilia ([place]) 09/29/2020   Urinary hesitancy 12/26/2020    Social History   Socioeconomic History   Marital status: Married    Spouse name: Not on file   Number of children: 2   Years of education: Not on file   Highest education  level: 11th grade  Occupational History   Occupation: Retired  Tobacco Use   Smoking status: Former    Types: Cigarettes    Quit date: 07/24/2007    Years since quitting: 14.5    Passive exposure: Past   Smokeless tobacco: Never   Tobacco comments:    smoking cessation materials not required  Vaping Use   Vaping Use: Never used  Substance and Sexual Activity   Alcohol use: Yes    Alcohol/week: 5.0 - 6.0 standard drinks of alcohol    Types: 5 - 6 Cans of beer per week    Comment:  weekly   Drug use: No   Sexual activity: Not Currently  Other Topics Concern   Not on file  Social History Narrative   Lives in [place]. Retired.  Completed cardiac rehab program.   Social Determinants of Health   Financial Resource Strain: Low Risk  (12/20/2021)   Overall Financial Resource Strain (CARDIA)    Difficulty of Paying Living Expenses: Not hard at all  Food Insecurity: No Food Insecurity (12/20/2021)   Hunger Vital Sign    Worried About Running Out of Food in the Last Year: Never true    Ran Out of Food in the Last Year: Never true  Transportation Needs: No Transportation Needs (12/20/2021)   PRAPARE - Hydrologist (Medical): No    Lack of Transportation (Non-Medical): No  Physical Activity: Inactive (12/20/2021)   Exercise Vital Sign    Days of Exercise per Week: 0 days    Minutes of Exercise per Session: 0 min  Stress: No Stress Concern Present (12/20/2021)   [place]    Feeling of Stress : Not at all  Social Connections: Moderately Integrated (12/20/2021)   Social Connection and Isolation Panel [NHANES]    Frequency of Communication with Friends and Family: More than three times a week    Frequency of Social Gatherings with Friends and Family: Three times a week    Attends Religious Services: More than 4 times per year    Active Member of Clubs or Organizations: No    Attends Theatre manager Meetings: Never    Marital Status: Married  Human resources officer Violence: Not At Risk (12/20/2021)   Humiliation, Afraid, Rape, and Kick questionnaire    Fear of Current or Ex-Partner: No    Emotionally Abused: No    Physically  Abused: No    Sexually Abused: No    Past Surgical History:  Procedure Laterality Date   ANAL FISTULECTOMY N/A 12/21/2014   Procedure: FISTULECTOMY ANAL;  Surgeon: Christene Lye, MD;  Location: [facility] ORS;  Service: General;  Laterality: N/A;   APPENDECTOMY  1975   CARDIAC CATHETERIZATION     COLONOSCOPY WITH PROPOFOL N/A 02/12/2019   Procedure: COLONOSCOPY WITH PROPOFOL;  Surgeon: Lucilla Lame, MD;  Location: [facility] ENDOSCOPY;  Service: Endoscopy;  Laterality: N/A;   CORONARY STENT INTERVENTION N/A 09/09/2017   Procedure: CORONARY STENT INTERVENTION;  Surgeon: Wellington Hampshire, MD;  Location: [place] [facility];  Service: Cardiovascular;  Laterality: N/A;   CORONARY/GRAFT ACUTE MI REVASCULARIZATION N/A 07/18/2017   Procedure: Coronary/Graft Acute MI Revascularization;  Surgeon: Wellington Hampshire, MD;  Location: [place] [facility];  Service: Cardiovascular;  Laterality: N/A;   HEMORRHOID SURGERY N/A 12/21/2014   Procedure: HEMORRHOIDECTOMY;  Surgeon: Christene Lye, MD;  Location: [facility] ORS;  Service: General;  Laterality: N/A;   INTRAVASCULAR ULTRASOUND/IVUS N/A 09/09/2017   Procedure: Intravascular Ultrasound/IVUS;  Surgeon: Wellington Hampshire, MD;  Location: [place] [facility];  Service: Cardiovascular;  Laterality: N/A;   KNEE SURGERY Right age 58   LEFT HEART CATH AND CORONARY ANGIOGRAPHY N/A 07/18/2017   Procedure: LEFT HEART CATH AND CORONARY ANGIOGRAPHY;  Surgeon: Wellington Hampshire, MD;  Location: [place] [facility];  Service: Cardiovascular;  Laterality: N/A;   LEFT HEART CATH AND CORONARY ANGIOGRAPHY Left 08/19/2017   Procedure: LEFT HEART CATH AND CORONARY ANGIOGRAPHY;  Surgeon: Wellington Hampshire, MD;  Location: [place] [facility]  [facility];  Service: Cardiovascular;  Laterality: Left;   LEFT HEART CATH AND CORONARY ANGIOGRAPHY N/A 11/07/2017   Procedure: LEFT HEART CATH AND CORONARY ANGIOGRAPHY;  Surgeon: Wellington Hampshire, MD;  Location: [place] [facility];  Service: Cardiovascular;  Laterality: N/A;   LEFT HEART CATH AND CORONARY ANGIOGRAPHY N/A 10/17/2021   Procedure: LEFT HEART CATH AND CORONARY ANGIOGRAPHY;  Surgeon: Wellington Hampshire, MD;  Location: [place] [facility];  Service: Cardiovascular;  Laterality: N/A;   ROTATOR CUFF REPAIR Right 2013    Family History  Problem Relation Age of Onset   Hypertension Mother    Alzheimer's disease Mother    Hypertension Father    Alzheimer's disease Father     Allergies  Allergen Reactions   Codeine Hives   Shellfish Allergy Hives and Swelling   Contrast Media [Iodinated Contrast Media] Itching and Other (See Comments)    [place] on tongue   Fish Allergy Itching       Latest Ref Rng & Units 10/12/2021   10:42 AM 04/13/2021    8:58 AM 12/05/2020   11:54 AM  CBC  WBC 4.0 - 10.5 K/uL 6.0  7.1  7.4   Hemoglobin 13.0 - 17.0 g/dL 13.9  15.0  14.9   Hematocrit 39.0 - 52.0 % 42.3  46.2  43.7   Platelets 150 - 400 K/uL 272  248  295       CMP     Component Value Date/Time   NA 138 12/13/2021 0942   NA 143 04/13/2021 0858   K 4.1 12/13/2021 0942   CL 102 12/13/2021 0942   CO2 28 12/13/2021 0942   GLUCOSE 106 (H) 12/13/2021 0942   BUN 10 12/13/2021 0942   BUN 10 04/13/2021 0858   CREATININE 0.94 12/13/2021 0942   CALCIUM 9.5 12/13/2021 0942   PROT 7.2 04/13/2021 0858   ALBUMIN  4.8 04/13/2021 0858   AST 25 04/13/2021 0858   ALT 22 04/13/2021 0858   ALKPHOS 71 04/13/2021 0858   BILITOT 1.1 04/13/2021 0858   GFRNONAA >60 12/13/2021 0942   GFRAA 80 04/11/2020 0914     No results found.     Assessment & Plan:   1. Chronic deep vein thrombosis (DVT) of brachial vein of left upper extremity (HCC) Given the fact that the patient's upper extremity thrombus  is chronic, there is currently no role for thrombectomy or intervention.  Typically thrombectomy is most effective within the DVT is acute and typically about 2 to 3 weeks post symptom initiation.  Risk of embolism is negated by his chronic nature.  Do not recommend changing anticoagulation patient should continue with Plavix and aspirin.  Some of the patient's discomfort may be related to postphlebitic symptoms.  In which case compression is advised.  Agree with cardiology standpoint of hypercoagulable work-up as there does not seem to be an inciting event for this thrombus.  2. Carpal tunnel syndrome, bilateral This may play a component of the patient's numbness  3. Pain of upper extremity, unspecified laterality Based upon the patient's description of pain I suspect is more musculoskeletal in nature.  However, given the patient's history of peripheral arterial disease, arterial involvement cannot be fully ruled out.  We will have the patient return for upper extremity WBI   Current Outpatient Medications on File Prior to Visit  Medication Sig Dispense Refill   aspirin EC 81 MG tablet Take 81 mg by mouth daily.      atorvastatin (LIPITOR) 80 MG tablet TAKE 1 TABLET EVERY DAY AT 6:00PM 90 tablet 0   carvedilol (COREG) 3.125 MG tablet Take 1 tablet (3.125 mg total) by mouth 2 (two) times daily with a meal. 180 tablet 2   clopidogrel (PLAVIX) 75 MG tablet TAKE 1 TABLET EVERY DAY 90 tablet 0   diclofenac Sodium (VOLTAREN) 1 % GEL Apply 1 application. topically daily as needed (pain).     ezetimibe (ZETIA) 10 MG tablet TAKE 1 TABLET EVERY DAY 90 tablet 0   furosemide (LASIX) 20 MG tablet TAKE 1 TABLET EVERY DAY 90 tablet 0   nitroGLYCERIN (NITROSTAT) 0.4 MG SL tablet Place 1 tablet (0.4 mg total) under the tongue every 5 (five) minutes as needed for chest pain. 30 tablet 0   oxybutynin (DITROPAN-XL) 10 MG 24 hr tablet Take 1 tablet (10 mg total) by mouth at bedtime. 90 tablet 3   potassium chloride  (KLOR-CON) 10 MEQ tablet TAKE 1 TABLET (10 MEQ TOTAL) BY MOUTH DAILY. 90 tablet 3   sacubitril-valsartan (ENTRESTO) 49-51 MG Take 1 tablet by mouth 2 (two) times daily. 60 tablet 11   sacubitril-valsartan (ENTRESTO) 49-51 MG Take 1 tablet by mouth 2 (two) times daily. 180 tablet 3   sildenafil (REVATIO) 20 MG tablet Take 1 tablet (20 mg total) by mouth 3 (three) times daily as needed. 90 tablet 1   tamsulosin (FLOMAX) 0.4 MG CAPS capsule Take 1 capsule (0.4 mg total) by mouth daily. 90 capsule 3   No current facility-administered medications on file prior to visit.    There are no Patient Instructions on file for this visit. No follow-ups on file.   Kris Hartmann, NP

## 2022-02-08 NOTE — Progress Notes (Unsigned)
Cardiology Office Note    Date:  02/12/2022   ID:  Todd Weaver, DOB May 01, 1951, MRN [id number]  PCP:  Glean Hess, MD  Cardiologist:  Kathlyn Sacramento, MD  Electrophysiologist:  None   Chief Complaint: Follow-up  History of Present Illness:   Todd Weaver is a 71 y.o. male with history of CAD with inferior ST elevation MI complicated by ventricular fibrillation arrest in 06/2017 status post PCI/DES with overlapping drug-eluting stent placement to the RCA and A-fib post procedure status post staged IVUS guided DES to the proximal and ostial LAD in 08/2017, chronic combined systolic and diastolic CHF, CVA, PAD, HTN, HLD, prior tobacco use, and BPH who presents for follow-up of CAD and cardiomyopathy.   He was admitted in 06/2017 with an inferior ST elevation MI complicated by ventricular fibrillation arrest.  He underwent successful PCI/DES with 2 overlapping drug-eluting stents to the RCA.  LHC at that time also demonstrated significant ostial LAD stenosis, borderline significant proximal LAD stenosis, and moderate distal LAD stenosis.  LVEF was mildly reduced.  Postprocedure, he had A-fib that was successfully treated with amiodarone.  He underwent staged IVUS guided PCI/DES to the ostial and proximal LAD in 08/2017.  Repeat LHC in 10/2017 showed widely patent LAD and RCA stents with no significant restenosis.  The ramus branch was pinched by the LAD stent with 70% ostial stenosis.  LVEF was 50 to 55%.   He has known PAD that has been managed medically.  Noninvasive vascular studies in 01/2019 demonstrated  moderately reduced ABI in the 0.7 range bilaterally with diffuse moderate SFA disease worse on the left side.  He had repeat ABI done in 01/2020 which was stable overall. Nuclear stress test in 08/2019 showed no evidence of ischemia with normal ejection fraction.  Echo in 09/2019 showed an EF of 45 to 50% with mild mitral regurgitation.   He was seen in the office by Dr. Rockey Situ on  01/05/2021, and noted some left-sided chest discomfort that radiated into his left arm.  Symptoms were typically noted when he was laying in the bed, laying on his left side.  These episodes felt similar to his prior angina.  He was without exertional symptoms.  He underwent Lexiscan MPI on 01/18/2021 which was overall low risk and showed no evidence of ischemia.  LVEF appeared visually normal, though calculated value was mildly reduced at 47%.   He was seen in the office on 10/12/2021 noting progression of left arm and shoulder discomfort as well as 1 episode of profound exhaustion with associated nausea after climbing a flight of stairs.  Symptoms did not feel exactly similar to his prior angina.  He was without significant swelling of the upper extremity.  Diagnostic LHC on 10/17/2021 demonstrated widely patent RCA and LAD stents with no significant restenosis.  Stable 70% stenosis in the ostial ramus.  Otherwise, no obstructive disease.  Mildly reduced LV systolic function and mildly elevated LVEDP were noted.  Medical therapy was recommended.  Subsequent left upper extremity venous duplex demonstrated a chronic thrombus involving one of the left brachial veins.  Case was discussed with the patient's primary cardiologist with no intervention indicated.  Echo on 11/28/2021 demonstrated an EF of 45 to 50%, global hypokinesis, grade 2 diastolic dysfunction, normal RV systolic function and ventricular cavity size, no significant valvular abnormalities, and an estimated right atrial pressure of 3 mmHg.  He was last seen in the office in 11/2021 and was without symptoms of angina or  decompensation.  He did continue to note left arm pain was typically worse when at rest and improved with movement.  It was more noticeable at nighttime.  Given symptoms, and with ultrasound findings, he was referred to vascular surgery for further input with subsequent recommendation for conservative management.  Subsequent hypercoagulable  work-up was unrevealing.  With regards to his cardiomyopathy, we titrated Entresto to 49/51 mg twice daily.  He comes in doing well from a cardiac perspective and is without symptoms of angina or decompensation.  Since he was last seen he did have 1 episode of randomly occurring chest discomfort.  Otherwise, he has been without symptoms of chest pain.  No dyspnea, dizziness, presyncope, or syncope.  No significant lower extremity swelling.  He remains very active at baseline, working outdoors for numerous hours each day without cardiac limitation.  He does continue to note left upper extremity discomfort.  He also notes significant constipation following the titration of Entresto.  He is passing stool, though with significant straining and has also required an enema.   Labs independently reviewed: 11/2021 - potassium 4.1, BUN 10, serum creatinine 0.94 10/2021 - A1c 5.9, TSH normal 09/2021 - Hgb 13.9, PLT 272 03/2021 - TC 170, TG 50, HDL 68, LDL 92, A1c 6.0, albumin 4.8, AST/ALT normal  Past Medical History:  Diagnosis Date   Acute ST elevation myocardial infarction (STEMI) of inferior wall ([place]) 07/18/2017   BPH (benign prostatic hyperplasia) 06/19/2017   Carpal tunnel syndrome of right wrist 12/26/2020   Chest pain 9/41/7408   Chronic systolic congestive heart failure ([place]) 09/29/2020   Contrast media allergy    Coronary artery disease of native artery of native heart with stable angina pectoris ([place]) 10/03/2018   cardiac catheterization April 2019 showed widely patent LAD and RCA stent with no significant restenosis.  The ramus branch was pinched by the LAD stent with 70% ostial stenosis.  EF was 50 to 55%  12/2020: Myoview: T wave inversion was noted during stress in the V6, V5, II, III and aVF  leads.   There was no ST segment deviation noted during stress.   The study is normal.   This is a low risk s   ED (erectile dysfunction) 06/19/2017   Essential hypertension 06/19/2017   GERD  (gastroesophageal reflux disease)    Hand dermatitis 07/26/2014   Hemorrhoid    History of CVA (cerebrovascular accident) 06/19/2017   History of ST elevation myocardial infarction (STEMI) 07/18/2017   07/18/2017 s/p PCI/DES x 2 to the RCA with residual disease involving the distal left main/ostial LAD, proximal LAD, mid LAD, and LCx      Hyperlipidemia    Hypertension    Impingement syndrome of left shoulder region 10/14/2018   Ischemic cardiomyopathy    a. 06/2017 Echo: EF 45-50%, mod inf/infsept HK. Mildly dil LA; b. 09/2019 Echo: EF 45-50%, Gr2 DD. Mild MR.   Lumbar spondylosis with myelopathy 11/08/2016   MCI (mild cognitive impairment) 06/19/2017   Mixed hyperlipidemia 06/19/2017       Neck muscle spasm 12/26/2020   Overweight (BMI 25.0-29.9) 06/19/2017   PAD (peripheral artery disease) ([place])    a. 01/2019 ABIs and Duplex: ABI R 0.78, L 0.79; TBI R 0.66, L 1.11. RCIA >50p, RSFA 50-74. LCIA >50p, LSFA 75-99p, 30-49d. 3 vessel runoff bilat.   PAF (paroxysmal atrial fibrillation) ([place])    a. Brief episode during December hospitalization-->converted on IV amio-->not on Entiat.   Rectal polyp    Stroke [facility]) 2011   "  2 MINI STROKES". no deficits.   Thrombophilia ([place]) 09/29/2020   Urinary hesitancy 12/26/2020    Past Surgical History:  Procedure Laterality Date   ANAL FISTULECTOMY N/A 12/21/2014   Procedure: FISTULECTOMY ANAL;  Surgeon: Christene Lye, MD;  Location: [facility] ORS;  Service: General;  Laterality: N/A;   APPENDECTOMY  1975   CARDIAC CATHETERIZATION     COLONOSCOPY WITH PROPOFOL N/A 02/12/2019   Procedure: COLONOSCOPY WITH PROPOFOL;  Surgeon: Lucilla Lame, MD;  Location: [facility] ENDOSCOPY;  Service: Endoscopy;  Laterality: N/A;   CORONARY STENT INTERVENTION N/A 09/09/2017   Procedure: CORONARY STENT INTERVENTION;  Surgeon: Wellington Hampshire, MD;  Location: [place] [facility];  Service: Cardiovascular;  Laterality: N/A;   CORONARY/GRAFT ACUTE MI REVASCULARIZATION N/A 07/18/2017    Procedure: Coronary/Graft Acute MI Revascularization;  Surgeon: Wellington Hampshire, MD;  Location: [place] [facility];  Service: Cardiovascular;  Laterality: N/A;   HEMORRHOID SURGERY N/A 12/21/2014   Procedure: HEMORRHOIDECTOMY;  Surgeon: Christene Lye, MD;  Location: [facility] ORS;  Service: General;  Laterality: N/A;   INTRAVASCULAR ULTRASOUND/IVUS N/A 09/09/2017   Procedure: Intravascular Ultrasound/IVUS;  Surgeon: Wellington Hampshire, MD;  Location: [place] [facility];  Service: Cardiovascular;  Laterality: N/A;   KNEE SURGERY Right age 55   LEFT HEART CATH AND CORONARY ANGIOGRAPHY N/A 07/18/2017   Procedure: LEFT HEART CATH AND CORONARY ANGIOGRAPHY;  Surgeon: Wellington Hampshire, MD;  Location: [place] [facility];  Service: Cardiovascular;  Laterality: N/A;   LEFT HEART CATH AND CORONARY ANGIOGRAPHY Left 08/19/2017   Procedure: LEFT HEART CATH AND CORONARY ANGIOGRAPHY;  Surgeon: Wellington Hampshire, MD;  Location: [place] [facility];  Service: Cardiovascular;  Laterality: Left;   LEFT HEART CATH AND CORONARY ANGIOGRAPHY N/A 11/07/2017   Procedure: LEFT HEART CATH AND CORONARY ANGIOGRAPHY;  Surgeon: Wellington Hampshire, MD;  Location: [place] [facility];  Service: Cardiovascular;  Laterality: N/A;   LEFT HEART CATH AND CORONARY ANGIOGRAPHY N/A 10/17/2021   Procedure: LEFT HEART CATH AND CORONARY ANGIOGRAPHY;  Surgeon: Wellington Hampshire, MD;  Location: [place] [facility];  Service: Cardiovascular;  Laterality: N/A;   ROTATOR CUFF REPAIR Right 2013    Current Medications: Current Meds  Medication Sig   aspirin EC 81 MG tablet Take 81 mg by mouth daily.    atorvastatin (LIPITOR) 80 MG tablet TAKE 1 TABLET EVERY DAY AT 6:00PM   carvedilol (COREG) 3.125 MG tablet Take 1 tablet (3.125 mg total) by mouth 2 (two) times daily with a meal.   clopidogrel (PLAVIX) 75 MG tablet TAKE 1 TABLET EVERY DAY   diclofenac Sodium (VOLTAREN) 1 % GEL Apply 1 application. topically daily as needed (pain).    ezetimibe (ZETIA) 10 MG tablet TAKE 1 TABLET EVERY DAY   furosemide (LASIX) 20 MG tablet TAKE 1 TABLET EVERY DAY   nitroGLYCERIN (NITROSTAT) 0.4 MG SL tablet Place 1 tablet (0.4 mg total) under the tongue every 5 (five) minutes as needed for chest pain.   oxybutynin (DITROPAN-XL) 10 MG 24 hr tablet Take 1 tablet (10 mg total) by mouth at bedtime.   potassium chloride (KLOR-CON) 10 MEQ tablet TAKE 1 TABLET (10 MEQ TOTAL) BY MOUTH DAILY.   sacubitril-valsartan (ENTRESTO) 24-26 MG Take 1 tablet by mouth 2 (two) times daily.   tamsulosin (FLOMAX) 0.4 MG CAPS capsule Take 1 capsule (0.4 mg total) by mouth daily.   [DISCONTINUED] sacubitril-valsartan (ENTRESTO) 49-51 MG Take 1 tablet by mouth 2 (two) times daily.   [DISCONTINUED] sildenafil (  REVATIO) 20 MG tablet Take 1 tablet (20 mg total) by mouth 3 (three) times daily as needed.    Allergies:   Codeine, Shellfish allergy, Contrast media [iodinated contrast media], and Fish allergy   Social History   Socioeconomic History   Marital status: Married    Spouse name: Not on file   Number of children: 2   Years of education: Not on file   Highest education level: 11th grade  Occupational History   Occupation: Retired  Tobacco Use   Smoking status: Former    Types: Cigarettes    Quit date: 07/24/2007    Years since quitting: 14.5    Passive exposure: Past   Smokeless tobacco: Never   Tobacco comments:    smoking cessation materials not required  Vaping Use   Vaping Use: Never used  Substance and Sexual Activity   Alcohol use: Yes    Alcohol/week: 5.0 - 6.0 standard drinks of alcohol    Types: 5 - 6 Cans of beer per week    Comment:  weekly   Drug use: No   Sexual activity: Not Currently  Other Topics Concern   Not on file  Social History Narrative   Lives in [place]. Retired.  Completed cardiac rehab program.   Social Determinants of Health   Financial Resource Strain: Low Risk  (12/20/2021)   Overall Financial Resource Strain  (CARDIA)    Difficulty of Paying Living Expenses: Not hard at all  Food Insecurity: No Food Insecurity (12/20/2021)   Hunger Vital Sign    Worried About Running Out of Food in the Last Year: Never true    Ran Out of Food in the Last Year: Never true  Transportation Needs: No Transportation Needs (12/20/2021)   PRAPARE - Hydrologist (Medical): No    Lack of Transportation (Non-Medical): No  Physical Activity: Inactive (12/20/2021)   Exercise Vital Sign    Days of Exercise per Week: 0 days    Minutes of Exercise per Session: 0 min  Stress: No Stress Concern Present (12/20/2021)   [place]    Feeling of Stress : Not at all  Social Connections: Moderately Integrated (12/20/2021)   Social Connection and Isolation Panel [NHANES]    Frequency of Communication with Friends and Family: More than three times a week    Frequency of Social Gatherings with Friends and Family: Three times a week    Attends Religious Services: More than 4 times per year    Active Member of Clubs or Organizations: No    Attends Archivist Meetings: Never    Marital Status: Married     Family History:  The patient's family history includes Alzheimer's disease in his father and mother; Hypertension in his father and mother.  ROS:   12-point review of systems is negative unless otherwise noted in HPI.   EKGs/Labs/Other Studies Reviewed:    Studies reviewed were summarized above. The additional studies were reviewed today:  2D echo 11/28/2021: 1. Left ventricular ejection fraction, by estimation, is 45 to 50%. The  left ventricle has mildly decreased function. The left ventricle  demonstrates global hypokinesis. Left ventricular diastolic parameters are  consistent with Grade II diastolic  dysfunction (pseudonormalization). The average left ventricular global  longitudinal strain is -17.5 %. The global  longitudinal strain is normal.   2. Right ventricular systolic function is normal. The right ventricular  size is normal.   3. The  mitral valve is normal in structure. No evidence of mitral valve  regurgitation. No evidence of mitral stenosis.   4. The aortic valve is normal in structure. Aortic valve regurgitation is  not visualized. No aortic stenosis is present.   5. The inferior vena cava is normal in size with greater than 50%  respiratory variability, suggesting right atrial pressure of 3 mmHg. __________   LHC 10/17/2021:   Ost Ramus lesion is 70% stenosed.   Mid LAD lesion is 20% stenosed.   Prox Cx lesion is 30% stenosed.   Non-stenotic Prox LAD lesion was previously treated.   Non-stenotic Ost RCA to Mid RCA lesion was previously treated.   There is mild left ventricular systolic dysfunction.   LV end diastolic pressure is mildly elevated.   The left ventricular ejection fraction is 45-50% by visual estimate.   1.  Widely patent RCA and LAD stents with no significant restenosis.  Stable 70% stenosis in ostial ramus.  No other obstructive disease. 2.  Mildly reduced LV systolic function and mildly elevated left ventricular end-diastolic pressure.   Recommendations: No culprit is identified for the patient's left arm pain.  Continue aggressive medical therapy for coronary artery disease. __________   Carlton Adam MPI 01/18/2021: T wave inversion was noted during stress in the V6, V5, II, III and aVF leads. There was no ST segment deviation noted during stress. The study is normal. This is a low risk study. The left ventricular ejection fraction visually appears normal, although calculated value is mildly decreased at 47%. Correlation with echo advised. There is no evidence for ischemia. __________   ABIs 02/05/2020: Bilateral ABIs appear essentially unchanged compared to prior study on  01/26/19. ABIs and right TBI are consistent with previous exam. Drop in left  TBI but still  normal.     Summary:  Right: Resting right ankle-brachial index indicates moderate right lower  extremity arterial disease. The right toe-brachial index is abnormal.   Left: Resting left ankle-brachial index indicates moderate left lower  extremity arterial disease. The left toe-brachial index is normal. __________   2D echo 09/29/2019: 1. Left ventricular ejection fraction, by estimation, is 45 to 50%. The  left ventricle has mildly decreased function. The left ventricle  demonstrates global hypokinesis. Left ventricular diastolic parameters are  consistent with Grade II diastolic  dysfunction (pseudonormalization). Elevated left atrial pressure.   2. Right ventricular systolic function is normal. The right ventricular  size is normal. Tricuspid regurgitation signal is inadequate for assessing  PA pressure.   3. The mitral valve is grossly normal. Mild mitral valve regurgitation.  No evidence of mitral stenosis.   4. The aortic valve is tricuspid. Aortic valve regurgitation is not  visualized. No aortic stenosis is present.   5. Mildly dilated pulmonary artery.   6. The inferior vena cava is normal in size with greater than 50%  respiratory variability, suggesting right atrial pressure of 3 mmHg. __________   2D echo 08/24/2019: There was no ST segment deviation noted during stress. The study is normal. This is a low risk study. The left ventricular ejection fraction is normal (55-65%). __________   LHC 11/07/2017: Mid LAD lesion is 30% stenosed. Previously placed Prox LAD drug eluting stent is widely patent. Prox Cx lesion is 40% stenosed. Non-stenotic Ost RCA to Mid RCA lesion previously treated. Ost Ramus lesion is 70% stenosed. Dist LAD lesion is 40% stenosed. LV end diastolic pressure is mildly elevated. There is mild left ventricular systolic dysfunction. The left ventricular  ejection fraction is 50-55% by visual estimate.   1.  Widely patent LAD and RCA stents with no  significant restenosis.  There is a relatively small size ramus br which is pinched by the LAD stent with 70% ostial stenosis.  No other obstructive disease. 2.  Low normal LV systolic function with an EF of 50-55% with mild inferior wall hypokinesis.  Mildly elevated left ventricular end-diastolic pressure.   Recommendations: Continue medical therapy.  Add sublingual nitroglycerin to be used as needed. The patient can be discharged home from a cardiac standpoint. __________   [facility] 09/09/2017: Dist LM to Ost LAD lesion is 85% stenosed. Prox LAD lesion is 60% stenosed. Mid LAD lesion is 30% stenosed. Prox Cx lesion is 40% stenosed. Non-stenotic Ost RCA to Mid RCA lesion previously treated. Ost Ramus lesion is 50% stenosed. Post intervention, there is a 0% residual stenosis. Post intervention, there is a 0% residual stenosis. A drug-eluting stent was successfully placed using a STENT SIERRA 3.00 X 38 MM.   Successful IVUS guided drug-eluting stent placement to the proximal and ostial LAD with one long stent.   Recommendations: Dual antiplatelet therapy for at least one year.  Aggressive treatment of risk factors. __________   LHC 08/19/2017: Dist LM to Ost LAD lesion is 85% stenosed. Prox LAD lesion is 60% stenosed. Mid LAD lesion is 30% stenosed. Prox Cx lesion is 40% stenosed. Previously placed Ost RCA to Mid RCA drug eluting stent and stent (unknown type) is widely patent. Balloon angioplasty was performed. Ost Ramus lesion is 50% stenosed. There is mild left ventricular systolic dysfunction. LV end diastolic pressure is normal. The left ventricular ejection fraction is 45-50% by visual estimate.   1.  Significant underlying two-vessel coronary artery disease with widely patent stents in the right coronary artery without any restenosis.  Significant ostial LAD stenosis is unchanged with moderate disease in the proximal and mid segment. 2.  Mildly reduced LV systolic function with  an EF of 45% with inferior wall hypokinesis.  Normal left ventricular end-diastolic pressure.   Recommendations: Continue medical therapy for now.  I will discuss with my colleagues about management options of ostial LAD stenosis with PCI versus one-vessel CABG. __________   2D echo 07/18/2017: - Left ventricle: The cavity size was normal. There was mild    concentric hypertrophy. Systolic function was mildly reduced. The    estimated ejection fraction was in the range of 45% to 50%.    Moderate hypokinesis of the inferior and inferoseptal myocardium.    The study was not technically sufficient to allow evaluation of    LV diastolic dysfunction due to atrial fibrillation.  - Left atrium: The atrium was mildly dilated. __________   [facility] 09/18/2016: Ost RCA to Mid RCA lesion is 99% stenosed. A drug-eluting stent was successfully placed using a STENT SIERRA 3.00 X 38 MM. Post intervention, there is a 0% residual stenosis. A stent was successfully placed. Post Atrio lesion is 60% stenosed. Dist LM to Ost LAD lesion is 85% stenosed. Prox Cx lesion is 40% stenosed. Prox LAD lesion is 60% stenosed. Mid LAD lesion is 40% stenosed.   1.  Significant two-vessel coronary artery disease with subtotal occlusion of the proximal right coronary artery which is the culprit for inferior ST elevation myocardial infarction.  There is also 85% ostial LAD stenosis.  There is a medium sized ramus branch with 60% ostial stenosis. 2.  Normal left ventricular end-diastolic pressure. 3.  Successful complex angioplasty and 2 overlapped  drug-eluting stent placement to the proximal and ostial right coronary artery.   Recommendations: The patient had hypotension throughout PCI likely due to RV involvement.  He responded to IV fluids and norepinephrine drip.  Continue dual antiplatelet therapy for at least one year.  Aggressive treatment of risk factors. Further ischemic evaluation of the LAD will be required in the  near future.  Angiography was suboptimal given the patient's significant tachycardia. This was an overall difficult procedure due to long diffuse disease. __________   2D echo 04/09/2016: - Left ventricle: The cavity size was normal. Wall thickness was    normal. Systolic function was normal. The estimated ejection    fraction was in the range of 55% to 60%. Wall motion was normal;    there were no regional wall motion abnormalities. Left    ventricular diastolic function parameters were normal.   Impressions:   - Normal study. __________   Carlton Adam MPI 04/06/2016: There was no ST segment deviation noted during stress. No T wave inversion was noted during stress. This is a low risk study. The left ventricular ejection fraction is normal (55-65%). Defect 1: There is a small defect of mild severity present in the apex location. This is patrially reversible and suspected to be due to an artifact given intense GI uptake which affected the quality of study. Correlate clinically   EKG:  EKG is not ordered today.    Recent Labs: 04/13/2021: ALT 22 10/12/2021: Hemoglobin 13.9; Platelets 272 11/07/2021: TSH 2.390 12/13/2021: BUN 10; Creatinine, Ser 0.94; Potassium 4.1; Sodium 138  Recent Lipid Panel    Component Value Date/Time   CHOL 170 04/13/2021 0858   TRIG 50 04/13/2021 0858   HDL 68 04/13/2021 0858   CHOLHDL 2.5 04/13/2021 0858   CHOLHDL 2.8 11/07/2017 0511   VLDL 4 11/07/2017 0511   LDLCALC 92 04/13/2021 0858   LDLDIRECT 116 (H) 09/24/2017 1338    PHYSICAL EXAM:    VS:  BP 138/70 (BP Location: Left Arm, Patient Position: Sitting, Cuff Size: Normal)   Pulse 65   Ht '5\' 7"'$  (1.702 m)   Wt 178 lb 2 oz (80.8 kg)   SpO2 98%   BMI 27.90 kg/m   BMI: Body mass index is 27.9 kg/m.  Physical Exam Vitals reviewed.  Constitutional:      Appearance: He is well-developed.  HENT:     Head: Normocephalic and atraumatic.  Eyes:     General:        Right eye: No discharge.         Left eye: No discharge.  Neck:     Vascular: No JVD.  Cardiovascular:     Rate and Rhythm: Normal rate and regular rhythm.     Pulses:          Posterior tibial pulses are 2+ on the right side and 2+ on the left side.     Heart sounds: Normal heart sounds, S1 normal and S2 normal. Heart sounds not distant. No midsystolic click and no opening snap. No murmur heard.    No friction rub.  Pulmonary:     Effort: Pulmonary effort is normal. No respiratory distress.     Breath sounds: Normal breath sounds. No decreased breath sounds, wheezing or rales.  Chest:     Chest wall: No tenderness.  Abdominal:     General: There is no distension.  Musculoskeletal:     Cervical back: Normal range of motion.     Right lower leg: No edema.  Left lower leg: No edema.  Skin:    General: Skin is warm and dry.     Nails: There is no clubbing.  Neurological:     Mental Status: He is alert and oriented to person, place, and time.  Psychiatric:        Speech: Speech normal.        Behavior: Behavior normal.        Thought Content: Thought content normal.        Judgment: Judgment normal.     Wt Readings from Last 3 Encounters:  02/12/22 178 lb 2 oz (80.8 kg)  01/12/22 180 lb (81.6 kg)  12/28/21 182 lb (82.6 kg)     ASSESSMENT & PLAN:   CAD involving the native coronary arteries without angina: Recent LHC showed stable coronary artery disease with patent LAD and RCA stents.  No intervention indicated.  Continue aggressive risk factor modification and secondary prevention including aspirin, clopidogrel, atorvastatin, carvedilol, and ezetimibe.  No indication for further ischemic testing at this time.  Chronic combined systolic and diastolic CHF secondary to ICM: He appears euvolemic and well compensated with NYHA class I symptoms.  We will reduce Entresto back down to 24/26 mg twice daily given constipation.  Otherwise remains on low-dose carvedilol.  Heart rates preclude titration of  beta-blocker.  Repeat limited echo in 2 months time.  If his cardiomyopathy persists, would recommend escalation of GDMT with MRA and/or SGLT2 inhibitor.  [place] A-fib: Noted following MI and VF arrest.  Subsequent outpatient cardiac monitoring showed no further evidence of atrial arrhythmia.  Not on anticoagulation given isolated episode in the context of significant acute illness.  Should recurrence of A-fib be identified, [place] will need to be revisited at that time.  HTN: Blood pressure reasonably controlled.  Continue medical therapy as outlined above.  HLD: LDL 92.  He remains on atorvastatin 80 mg along with ezetimibe.  Update fasting lipid panel and follow-up.  Chronic small left brachial thrombus: Hypercoagulable work-up reassuring.  Evaluated by vascular surgery.  Conservative therapy.  Constipation: Reduce Entresto as outlined above.  Stool softeners are encouraged.  ED: Refill sildenafil.  Precautions were given regarding as needed SL NTG.   Disposition: F/u with Dr. Fletcher Anon or an APP in 3 months.   Medication Adjustments/Labs and Tests Ordered: Current medicines are reviewed at length with the patient today.  Concerns regarding medicines are outlined above. Medication changes, Labs and Tests ordered today are summarized above and listed in the Patient Instructions accessible in Encounters.   Signed, [name], PA-C 02/12/2022 9:34 AM     [facility] [address] [place] [address] [place], [place] [address] [phone]

## 2022-02-12 ENCOUNTER — Encounter: Payer: Self-pay | Admitting: Physician Assistant

## 2022-02-12 ENCOUNTER — Ambulatory Visit: Payer: Medicare HMO | Admitting: Physician Assistant

## 2022-02-12 VITALS — BP 138/70 | HR 65 | Ht 67.0 in | Wt 178.1 lb

## 2022-02-12 DIAGNOSIS — I10 Essential (primary) hypertension: Secondary | ICD-10-CM | POA: Diagnosis not present

## 2022-02-12 DIAGNOSIS — E785 Hyperlipidemia, unspecified: Secondary | ICD-10-CM

## 2022-02-12 DIAGNOSIS — I2510 Atherosclerotic heart disease of native coronary artery without angina pectoris: Secondary | ICD-10-CM

## 2022-02-12 DIAGNOSIS — I82729 Chronic embolism and thrombosis of deep veins of unspecified upper extremity: Secondary | ICD-10-CM

## 2022-02-12 DIAGNOSIS — I5042 Chronic combined systolic (congestive) and diastolic (congestive) heart failure: Secondary | ICD-10-CM | POA: Diagnosis not present

## 2022-02-12 DIAGNOSIS — I255 Ischemic cardiomyopathy: Secondary | ICD-10-CM | POA: Diagnosis not present

## 2022-02-12 DIAGNOSIS — I4891 Unspecified atrial fibrillation: Secondary | ICD-10-CM | POA: Diagnosis not present

## 2022-02-12 MED ORDER — ENTRESTO 24-26 MG PO TABS: 1.0000 | ORAL_TABLET | Freq: Two times a day (BID) | ORAL | 11 refills | Status: DC

## 2022-02-12 MED ORDER — SILDENAFIL CITRATE 20 MG PO TABS: 20.0000 mg | ORAL_TABLET | Freq: Three times a day (TID) | ORAL | 1 refills | Status: DC | PRN

## 2022-02-12 NOTE — Patient Instructions (Signed)
Medication Instructions:  Your physician has recommended you make the following change in your medication:   DECREASE Entresto to 24-26 mg twice a day  *If you need a refill on your cardiac medications before your next appointment, please call your pharmacy*   Lab Work: None  If you have labs (blood work) drawn today and your tests are completely normal, you will receive your results only by: [place] (if you have MyChart) OR A paper copy in the mail If you have any lab test that is abnormal or we need to change your treatment, we will call you to review the results.   Testing/Procedures: Your physician has requested that you have a limited echocardiogram in 2 months.   Echocardiography is a painless test that uses sound waves to create images of your heart. It provides your doctor with information about the size and shape of your heart and how well your heart's chambers and valves are working. This procedure takes approximately one hour. There are no restrictions for this procedure.     Follow-Up: At [facility], you and your health needs are our priority.  As part of our continuing mission to provide you with exceptional heart care, we have created designated Provider Care Teams.  These Care Teams include your primary Cardiologist (physician) and Advanced Practice Providers (APPs -  Physician Assistants and Nurse Practitioners) who all work together to provide you with the care you need, when you need it.  Your next appointment:   3 month(s)  The format for your next appointment:   In Person  Provider:   Kathlyn Sacramento, MD or [name], PA-C        Important Information About Sugar

## 2022-02-13 ENCOUNTER — Telehealth: Payer: Self-pay | Admitting: *Deleted

## 2022-02-13 NOTE — Telephone Encounter (Signed)
PA required for Sildenafil Citrate 20 mg tablet. PA has been submitted via covermymeds. Awaiting approval.

## 2022-02-15 ENCOUNTER — Other Ambulatory Visit (INDEPENDENT_AMBULATORY_CARE_PROVIDER_SITE_OTHER): Payer: Self-pay | Admitting: Nurse Practitioner

## 2022-02-15 DIAGNOSIS — M79603 Pain in arm, unspecified: Secondary | ICD-10-CM

## 2022-02-16 ENCOUNTER — Telehealth: Payer: Self-pay | Admitting: Physician Assistant

## 2022-02-16 ENCOUNTER — Ambulatory Visit (INDEPENDENT_AMBULATORY_CARE_PROVIDER_SITE_OTHER): Payer: Medicare HMO | Admitting: Nurse Practitioner

## 2022-02-16 ENCOUNTER — Ambulatory Visit (INDEPENDENT_AMBULATORY_CARE_PROVIDER_SITE_OTHER): Payer: Medicare HMO

## 2022-02-16 ENCOUNTER — Encounter (INDEPENDENT_AMBULATORY_CARE_PROVIDER_SITE_OTHER): Payer: Self-pay | Admitting: Nurse Practitioner

## 2022-02-16 VITALS — BP 138/72 | HR 53 | Resp 16 | Wt 177.0 lb

## 2022-02-16 DIAGNOSIS — R202 Paresthesia of skin: Secondary | ICD-10-CM

## 2022-02-16 DIAGNOSIS — I10 Essential (primary) hypertension: Secondary | ICD-10-CM

## 2022-02-16 DIAGNOSIS — M79603 Pain in arm, unspecified: Secondary | ICD-10-CM

## 2022-02-16 DIAGNOSIS — R200 Anesthesia of skin: Secondary | ICD-10-CM

## 2022-02-16 DIAGNOSIS — I82722 Chronic embolism and thrombosis of deep veins of left upper extremity: Secondary | ICD-10-CM | POA: Diagnosis not present

## 2022-02-16 NOTE — Telephone Encounter (Signed)
Received denial letter from [facility] regarding his sildenafil.   Called patient and advised this is not covered by insurance but he can use goodrx to get it at a discounted rate. He verbalized understanding with no further questions at this time.

## 2022-02-16 NOTE — Progress Notes (Signed)
Subjective:    Patient ID: PIUS BYROM, male    DOB: 03-Jul-1951, 71 y.o.   MRN: [id number] Chief Complaint  Patient presents with   Follow-up    Ultrasound follow up    Tiran Sauseda is a 71 year old male who presents today as a referral from Mr. Idolina Primer, Hershal Coria in regards to chronic left brachial vein DVT and arm pain.  He notes that the pain is an aching sensation that radiates down to his hand.  He notes that it is more of a numbness tingling sensation he notes that the pain comes and goes.  It tends to be worse at rest and in the bed and somewhat better with activity.  He also notes that he has begun to have some tingling in the right upper extremity as well.  There are currently no open wounds or ulcerations.  He denies any dizziness.  Today noninvasive studies show primarily triphasic waveforms throughout the bilateral upper extremities.  The right upper extremity has noted monophasic waveforms in the palmar arch.  The patient had adequate Allen's test is because is not symptomatic in both upper extremities.    Review of Systems  Neurological:  Positive for numbness.  All other systems reviewed and are negative.      Objective:   Physical Exam Vitals reviewed.  HENT:     Head: Normocephalic.  Cardiovascular:     Rate and Rhythm: Normal rate.     Pulses:          Radial pulses are 2+ on the right side and 2+ on the left side.  Pulmonary:     Effort: Pulmonary effort is normal.  Skin:    General: Skin is warm and dry.  Neurological:     Mental Status: He is alert and oriented to person, place, and time.  Psychiatric:        Mood and Affect: Mood normal.        Behavior: Behavior normal.        Thought Content: Thought content normal.        Judgment: Judgment normal.     BP 138/72 (BP Location: Right Arm)   Pulse (!) 53   Resp 16   Wt 177 lb (80.3 kg)   BMI 27.72 kg/m   Past Medical History:  Diagnosis Date   Acute ST elevation myocardial infarction (STEMI) of  inferior wall (HCC) 07/18/2017   BPH (benign prostatic hyperplasia) 06/19/2017   Carpal tunnel syndrome of right wrist 12/26/2020   Chest pain 5/53/7482   Chronic systolic congestive heart failure ([place]) 09/29/2020   Contrast media allergy    Coronary artery disease of native artery of native heart with stable angina pectoris ([place]) 10/03/2018   cardiac catheterization April 2019 showed widely patent LAD and RCA stent with no significant restenosis.  The ramus branch was pinched by the LAD stent with 70% ostial stenosis.  EF was 50 to 55%  12/2020: Myoview: T wave inversion was noted during stress in the V6, V5, II, III and aVF  leads.   There was no ST segment deviation noted during stress.   The study is normal.   This is a low risk s   ED (erectile dysfunction) 06/19/2017   Essential hypertension 06/19/2017   GERD (gastroesophageal reflux disease)    Hand dermatitis 07/26/2014   Hemorrhoid    History of CVA (cerebrovascular accident) 06/19/2017   History of ST elevation myocardial infarction (STEMI) 07/18/2017   07/18/2017 s/p PCI/DES x 2  to the RCA with residual disease involving the distal left main/ostial LAD, proximal LAD, mid LAD, and LCx      Hyperlipidemia    Hypertension    Impingement syndrome of left shoulder region 10/14/2018   Ischemic cardiomyopathy    a. 06/2017 Echo: EF 45-50%, mod inf/infsept HK. Mildly dil LA; b. 09/2019 Echo: EF 45-50%, Gr2 DD. Mild MR.   Lumbar spondylosis with myelopathy 11/08/2016   MCI (mild cognitive impairment) 06/19/2017   Mixed hyperlipidemia 06/19/2017       Neck muscle spasm 12/26/2020   Overweight (BMI 25.0-29.9) 06/19/2017   PAD (peripheral artery disease) ([place])    a. 01/2019 ABIs and Duplex: ABI R 0.78, L 0.79; TBI R 0.66, L 1.11. RCIA >50p, RSFA 50-74. LCIA >50p, LSFA 75-99p, 30-49d. 3 vessel runoff bilat.   PAF (paroxysmal atrial fibrillation) ([place])    a. Brief episode during December hospitalization-->converted on IV amio-->not on [place].   Rectal  polyp    Stroke [facility]) 2011   "2 MINI STROKES". no deficits.   Thrombophilia ([place]) 09/29/2020   Urinary hesitancy 12/26/2020    Social History   Socioeconomic History   Marital status: Married    Spouse name: Not on file   Number of children: 2   Years of education: Not on file   Highest education level: 11th grade  Occupational History   Occupation: Retired  Tobacco Use   Smoking status: Former    Types: Cigarettes    Quit date: 07/24/2007    Years since quitting: 14.5    Passive exposure: Past   Smokeless tobacco: Never   Tobacco comments:    smoking cessation materials not required  Vaping Use   Vaping Use: Never used  Substance and Sexual Activity   Alcohol use: Yes    Alcohol/week: 5.0 - 6.0 standard drinks of alcohol    Types: 5 - 6 Cans of beer per week    Comment:  weekly   Drug use: No   Sexual activity: Not Currently  Other Topics Concern   Not on file  Social History Narrative   Lives in [place]. Retired.  Completed cardiac rehab program.   Social Determinants of Health   Financial Resource Strain: Low Risk  (12/20/2021)   Overall Financial Resource Strain (CARDIA)    Difficulty of Paying Living Expenses: Not hard at all  Food Insecurity: No Food Insecurity (12/20/2021)   Hunger Vital Sign    Worried About Running Out of Food in the Last Year: Never true    Ran Out of Food in the Last Year: Never true  Transportation Needs: No Transportation Needs (12/20/2021)   PRAPARE - Hydrologist (Medical): No    Lack of Transportation (Non-Medical): No  Physical Activity: Inactive (12/20/2021)   Exercise Vital Sign    Days of Exercise per Week: 0 days    Minutes of Exercise per Session: 0 min  Stress: No Stress Concern Present (12/20/2021)   [place]    Feeling of Stress : Not at all  Social Connections: Moderately Integrated (12/20/2021)   Social Connection and Isolation  Panel [NHANES]    Frequency of Communication with Friends and Family: More than three times a week    Frequency of Social Gatherings with Friends and Family: Three times a week    Attends Religious Services: More than 4 times per year    Active Member of Clubs or Organizations: No  Attends Archivist Meetings: Never    Marital Status: Married  Human resources officer Violence: Not At Risk (12/20/2021)   Humiliation, Afraid, Rape, and Kick questionnaire    Fear of Current or Ex-Partner: No    Emotionally Abused: No    Physically Abused: No    Sexually Abused: No    Past Surgical History:  Procedure Laterality Date   ANAL FISTULECTOMY N/A 12/21/2014   Procedure: FISTULECTOMY ANAL;  Surgeon: Christene Lye, MD;  Location: [facility] ORS;  Service: General;  Laterality: N/A;   [place] N/A 02/12/2019   Procedure: COLONOSCOPY WITH PROPOFOL;  Surgeon: Lucilla Lame, MD;  Location: [facility] ENDOSCOPY;  Service: Endoscopy;  Laterality: N/A;   CORONARY STENT INTERVENTION N/A 09/09/2017   Procedure: CORONARY STENT INTERVENTION;  Surgeon: Wellington Hampshire, MD;  Location: [place] LAB;  Service: Cardiovascular;  Laterality: N/A;   CORONARY/GRAFT ACUTE MI REVASCULARIZATION N/A 07/18/2017   Procedure: Coronary/Graft Acute MI Revascularization;  Surgeon: Wellington Hampshire, MD;  Location: [place] [facility];  Service: Cardiovascular;  Laterality: N/A;   HEMORRHOID SURGERY N/A 12/21/2014   Procedure: HEMORRHOIDECTOMY;  Surgeon: Christene Lye, MD;  Location: [facility] ORS;  Service: General;  Laterality: N/A;   INTRAVASCULAR ULTRASOUND/IVUS N/A 09/09/2017   Procedure: Intravascular Ultrasound/IVUS;  Surgeon: Wellington Hampshire, MD;  Location: [place] [facility] LAB;  Service: Cardiovascular;  Laterality: N/A;   KNEE SURGERY Right age 54   LEFT HEART CATH AND CORONARY ANGIOGRAPHY N/A 07/18/2017   Procedure: LEFT HEART CATH AND  CORONARY ANGIOGRAPHY;  Surgeon: Wellington Hampshire, MD;  Location: [place] [facility];  Service: Cardiovascular;  Laterality: N/A;   LEFT HEART CATH AND CORONARY ANGIOGRAPHY Left 08/19/2017   Procedure: LEFT HEART CATH AND CORONARY ANGIOGRAPHY;  Surgeon: Wellington Hampshire, MD;  Location: [place] [facility] LAB;  Service: Cardiovascular;  Laterality: Left;   LEFT HEART CATH AND CORONARY ANGIOGRAPHY N/A 11/07/2017   Procedure: LEFT HEART CATH AND CORONARY ANGIOGRAPHY;  Surgeon: Wellington Hampshire, MD;  Location: [place] CV LAB;  Service: Cardiovascular;  Laterality: N/A;   LEFT HEART CATH AND CORONARY ANGIOGRAPHY N/A 10/17/2021   Procedure: LEFT HEART CATH AND CORONARY ANGIOGRAPHY;  Surgeon: Wellington Hampshire, MD;  Location: [place] CV LAB;  Service: Cardiovascular;  Laterality: N/A;   ROTATOR CUFF REPAIR Right 2013    Family History  Problem Relation Age of Onset   Hypertension Mother    Alzheimer's disease Mother    Hypertension Father    Alzheimer's disease Father     Allergies  Allergen Reactions   Codeine Hives   Shellfish Allergy Hives and Swelling   Contrast Media [Iodinated Contrast Media] Itching and Other (See Comments)    Whelts on tongue   Fish Allergy Itching       Latest Ref Rng & Units 10/12/2021   10:42 AM 04/13/2021    8:58 AM 12/05/2020   11:54 AM  CBC  WBC 4.0 - 10.5 K/uL 6.0  7.1  7.4   Hemoglobin 13.0 - 17.0 g/dL 13.9  15.0  14.9   Hematocrit 39.0 - 52.0 % 42.3  46.2  43.7   Platelets 150 - 400 K/uL 272  248  295       CMP     Component Value Date/Time   NA 138 12/13/2021 0942   NA 143 04/13/2021 0858   K 4.1 12/13/2021 0942   CL 102 12/13/2021  0942   CO2 28 12/13/2021 0942   GLUCOSE 106 (H) 12/13/2021 0942   BUN 10 12/13/2021 0942   BUN 10 04/13/2021 0858   CREATININE 0.94 12/13/2021 0942   CALCIUM 9.5 12/13/2021 0942   PROT 7.2 04/13/2021 0858   ALBUMIN 4.8 04/13/2021 0858   AST 25 04/13/2021 0858   ALT 22 04/13/2021 0858   ALKPHOS 71  04/13/2021 0858   BILITOT 1.1 04/13/2021 0858   GFRNONAA >60 12/13/2021 0942   GFRAA 80 04/11/2020 0914     No results found.     Assessment & Plan:   1. 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  Based on this there is no role for intervention.  There is also no role for additional anticoagulation.  We discussed postphlebitic symptoms which may account for his occasional discomfort in the upper extremity.  He suggested wearing a compression sleeve to help with discomfort.  2. Numbness and tingling in both hands Because this numbness and tingling comes and goes and tends to be worse with the patient laying down and better when he is upright.  Based on this, I suspect that this is related to nerve involvement in the patient's neck.  We will have the patient referred to orthopedic surgery for evaluation.  While the patient has somewhat diminished flow in the palmar arch, this is typically not amenable to vascular intervention.  The waveforms are also borderline biphasic and typically this would not account for his current symptoms.  We will have patient follow-up on an as-needed basis. - Ambulatory referral to Orthopedic Surgery  3. Hypertension, unspecified type Continue antihypertensive medications as already ordered, these medications have been reviewed and there are no changes at this time.    Current Outpatient Medications on File Prior to Visit  Medication Sig Dispense Refill   aspirin EC 81 MG tablet Take 81 mg by mouth daily.      atorvastatin (LIPITOR) 80 MG tablet TAKE 1 TABLET EVERY DAY AT 6:00PM 90 tablet 0   carvedilol (COREG) 3.125 MG tablet Take 1 tablet (3.125 mg total) by mouth 2 (two) times daily with a meal. 180 tablet 2   clopidogrel (PLAVIX) 75 MG tablet TAKE 1 TABLET EVERY DAY 90 tablet 0   diclofenac Sodium (VOLTAREN) 1 % GEL Apply 1 application. topically daily as needed (pain).      ezetimibe (ZETIA) 10 MG tablet TAKE 1 TABLET EVERY DAY 90 tablet 0   furosemide (LASIX) 20 MG tablet TAKE 1 TABLET EVERY DAY 90 tablet 0   nitroGLYCERIN (NITROSTAT) 0.4 MG SL tablet Place 1 tablet (0.4 mg total) under the tongue every 5 (five) minutes as needed for chest pain. 30 tablet 0   oxybutynin (DITROPAN-XL) 10 MG 24 hr tablet Take 1 tablet (10 mg total) by mouth at bedtime. 90 tablet 3   potassium chloride (KLOR-CON) 10 MEQ tablet TAKE 1 TABLET (10 MEQ TOTAL) BY MOUTH DAILY. 90 tablet 3   sacubitril-valsartan (ENTRESTO) 24-26 MG Take 1 tablet by mouth 2 (two) times daily. 60 tablet 11   sildenafil (REVATIO) 20 MG tablet Take 1 tablet (20 mg total) by mouth 3 (three) times daily as needed. 90 tablet 1   tamsulosin (FLOMAX) 0.4 MG CAPS capsule Take 1 capsule (0.4 mg total) by mouth daily. 90 capsule 3   No current facility-administered medications on file prior to visit.    There are no Patient Instructions on file for  this visit. No follow-ups on file.   Kris Hartmann, NP

## 2022-03-15 ENCOUNTER — Other Ambulatory Visit: Payer: Self-pay | Admitting: Internal Medicine

## 2022-03-15 DIAGNOSIS — E785 Hyperlipidemia, unspecified: Secondary | ICD-10-CM

## 2022-03-15 NOTE — Telephone Encounter (Signed)
Requested Prescriptions  Pending Prescriptions Disp Refills  . ezetimibe (ZETIA) 10 MG tablet [Pharmacy Med Name: EZETIMIBE 10 MG Tablet] 90 tablet 0    Sig: TAKE 1 TABLET EVERY DAY     Cardiovascular:  Antilipid - Sterol Transport Inhibitors Failed - 03/15/2022 10:24 AM      Failed - Lipid Panel in normal range within the last 12 months    Cholesterol, Total  Date Value Ref Range Status  04/13/2021 170 100 - 199 mg/dL Final   LDL Chol Calc (NIH)  Date Value Ref Range Status  04/13/2021 92 0 - 99 mg/dL Final   LDL Direct  Date Value Ref Range Status  09/24/2017 116 (H) 0 - 99 mg/dL Final   HDL  Date Value Ref Range Status  04/13/2021 68 >39 mg/dL Final   Triglycerides  Date Value Ref Range Status  04/13/2021 50 0 - 149 mg/dL Final         Passed - AST in normal range and within 360 days    AST  Date Value Ref Range Status  04/13/2021 25 0 - 40 IU/L Final         Passed - ALT in normal range and within 360 days    ALT  Date Value Ref Range Status  04/13/2021 22 0 - 44 IU/L Final         Passed - Patient is not pregnant      Passed - Valid encounter within last 12 months    Recent Outpatient Visits          2 months ago Carpal tunnel syndrome, bilateral   [place] [facility] at [facility], Jesse Sans, MD   4 months ago Essential hypertension   [place] [facility] at [facility], Jesse Sans, MD   6 months ago [place] and Sports Medicine at [facility], Jesse Sans, MD   11 months ago Annual physical exam   [facility] Primary [facility] and Sports Medicine at [facility], Jesse Sans, MD   1 year ago Essential hypertension   [place] [facility] Medicine at [facility], Jesse Sans, MD      Future Appointments            In 1 month Army Melia, Jesse Sans, MD [facility] Primary Care and Sports Medicine at [facility],  [facility]   In 2 months Dunn, Areta Haber, PA-C [facility], [place]   In 9 months Diamantina [place], Herbert Seta, MD [place]           . clopidogrel (PLAVIX) 75 MG tablet [Pharmacy Med Name: CLOPIDOGREL 75 MG Tablet] 90 tablet 0    Sig: TAKE 1 TABLET EVERY DAY     Hematology: Antiplatelets - clopidogrel Passed - 03/15/2022 10:24 AM      Passed - HCT in normal range and within 180 days    HCT  Date Value Ref Range Status  10/12/2021 42.3 39.0 - 52.0 % Final   Hematocrit  Date Value Ref Range Status  04/13/2021 46.2 37.5 - 51.0 % Final         Passed - HGB in normal range and within 180 days    Hemoglobin  Date Value Ref Range Status  10/12/2021 13.9 13.0 - 17.0 g/dL Final  04/13/2021 15.0 13.0 - 17.7 g/dL Final         Passed -  PLT in normal range and within 180 days    Platelets  Date Value Ref Range Status  10/12/2021 272 150 - 400 K/uL Final  04/13/2021 248 150 - 450 x10E3/uL Final         Passed - Cr in normal range and within 360 days    Creatinine, Ser  Date Value Ref Range Status  12/13/2021 0.94 0.61 - 1.24 mg/dL Final         Passed - Valid encounter within last 6 months    Recent Outpatient Visits          2 months ago Carpal tunnel syndrome, bilateral   [place] [facility] at [facility], Jesse Sans, MD   4 months ago Essential hypertension   [place] [facility] and [facility] Medicine at [facility], Jesse Sans, MD   6 months ago [place] and Sports Medicine at [facility], Jesse Sans, MD   11 months ago Annual physical exam   [facility] Care and Sports Medicine at [facility], Jesse Sans, MD   1 year ago Essential hypertension   [place] [facility] at [facility], Jesse Sans, MD      Future Appointments            In 1 month Army Melia, Jesse Sans, MD [facility] Primary Care and Sports  Medicine at [facility], [facility]   In 2 months Dunn, Areta Haber, PA-C [facility], [place]   In 9 months Diamantina [place], Herbert Seta, [place]

## 2022-04-17 ENCOUNTER — Ambulatory Visit (INDEPENDENT_AMBULATORY_CARE_PROVIDER_SITE_OTHER): Payer: Medicare HMO | Admitting: Internal Medicine

## 2022-04-17 ENCOUNTER — Encounter: Payer: Self-pay | Admitting: Internal Medicine

## 2022-04-17 VITALS — BP 126/78 | HR 68 | Ht 67.0 in | Wt 179.0 lb

## 2022-04-17 DIAGNOSIS — Z23 Encounter for immunization: Secondary | ICD-10-CM

## 2022-04-17 DIAGNOSIS — I82722 Chronic embolism and thrombosis of deep veins of left upper extremity: Secondary | ICD-10-CM

## 2022-04-17 DIAGNOSIS — I10 Essential (primary) hypertension: Secondary | ICD-10-CM | POA: Diagnosis not present

## 2022-04-17 DIAGNOSIS — E782 Mixed hyperlipidemia: Secondary | ICD-10-CM | POA: Diagnosis not present

## 2022-04-17 DIAGNOSIS — D6859 Other primary thrombophilia: Secondary | ICD-10-CM

## 2022-04-17 DIAGNOSIS — I25118 Atherosclerotic heart disease of native coronary artery with other forms of angina pectoris: Secondary | ICD-10-CM

## 2022-04-17 DIAGNOSIS — R7303 Prediabetes: Secondary | ICD-10-CM | POA: Diagnosis not present

## 2022-04-17 DIAGNOSIS — Z0000 Encounter for general adult medical examination without abnormal findings: Secondary | ICD-10-CM | POA: Diagnosis not present

## 2022-04-17 DIAGNOSIS — N401 Enlarged prostate with lower urinary tract symptoms: Secondary | ICD-10-CM

## 2022-04-17 DIAGNOSIS — B354 Tinea corporis: Secondary | ICD-10-CM

## 2022-04-17 DIAGNOSIS — F5101 Primary insomnia: Secondary | ICD-10-CM

## 2022-04-17 DIAGNOSIS — R351 Nocturia: Secondary | ICD-10-CM

## 2022-04-17 MED ORDER — CLOTRIMAZOLE-BETAMETHASONE 1-0.05 % EX CREA: 1.0000 | TOPICAL_CREAM | Freq: Two times a day (BID) | CUTANEOUS | 1 refills | Status: DC

## 2022-04-17 MED ORDER — TEMAZEPAM 15 MG PO CAPS
15.0000 mg | ORAL_CAPSULE | Freq: Every evening | ORAL | 0 refills | Status: DC | PRN
Start: 2022-04-17 — End: 2022-06-18

## 2022-04-17 NOTE — Progress Notes (Signed)
Date:  04/17/2022   Name:  Todd Weaver   DOB:  [birth year]   MRN:  [id number]   Chief Complaint: Annual Exam Todd Weaver is a 71 y.o. male who presents today for his Complete Annual Exam. He feels fairly well. He reports exercising- walking. He reports he is sleeping poorly.   Colonoscopy: 01/2019  Immunization History  Administered Date(s) Administered   Fluad Quad(high Dose 65+) 03/31/2019, 04/11/2020, 04/13/2021   Influenza,inj,Quad PF,6+ Mos 06/19/2017, 03/28/2018   PFIZER(Purple Top)SARS-COV-2 Vaccination 10/01/2019, 10/27/2019   Pneumococcal Conjugate-13 03/28/2018   Pneumococcal Polysaccharide-23 12/09/2019   Tdap 06/19/2017   Health Maintenance Due  Topic Date Due   Zoster Vaccines- Shingrix (1 of 2) Never done   INFLUENZA VACCINE  02/20/2022    Lab Results  Component Value Date   PSA1 1.8 04/13/2021   PSA1 1.5 04/11/2020   PSA1 1.2 03/31/2019     Hypertension This is a chronic problem. The problem is controlled. Pertinent negatives include no chest pain, headaches, palpitations or shortness of breath. Past treatments include beta blockers, angiotensin blockers and diuretics. Hypertensive end-organ damage includes CAD/MI, CVA and heart failure. There is no history of kidney disease.  Hyperlipidemia This is a chronic problem. The problem is resistant. Pertinent negatives include no chest pain, myalgias or shortness of breath. Current antihyperlipidemic treatment includes statins and ezetimibe.  Diabetes He presents for his follow-up diabetic visit. Diabetes type: prediabetes. Pertinent negatives for hypoglycemia include no dizziness, headaches or nervousness/anxiousness. Pertinent negatives for diabetes include no chest pain and no fatigue. Diabetic complications include a CVA.  Insomnia Primary symptoms: no sleep disturbance, frequent awakening.   The problem occurs nightly. Treatments tried: melatonin.  Rash This is a new problem. The current episode started  1 to 4 weeks ago. The affected locations include the left upper leg and right upper leg. Pertinent negatives include no diarrhea, fatigue or shortness of breath.  CAD - stable symptoms - some arm pain from DVT but no intervention indicated. No angina or shortness of breath.  He continue to be active during the day.  Entresto was increased and he tolerated that with some constipation.  Lab Results  Component Value Date   NA 138 12/13/2021   K 4.1 12/13/2021   CO2 28 12/13/2021   GLUCOSE 106 (H) 12/13/2021   BUN 10 12/13/2021   CREATININE 0.94 12/13/2021   CALCIUM 9.5 12/13/2021   EGFR 79 04/13/2021   GFRNONAA >60 12/13/2021   Lab Results  Component Value Date   CHOL 170 04/13/2021   HDL 68 04/13/2021   LDLCALC 92 04/13/2021   LDLDIRECT 116 (H) 09/24/2017   TRIG 50 04/13/2021   CHOLHDL 2.5 04/13/2021   Lab Results  Component Value Date   TSH 2.390 11/07/2021   Lab Results  Component Value Date   HGBA1C 5.9 (H) 11/07/2021   Lab Results  Component Value Date   WBC 6.0 10/12/2021   HGB 13.9 10/12/2021   HCT 42.3 10/12/2021   MCV 81.0 10/12/2021   PLT 272 10/12/2021   Lab Results  Component Value Date   ALT 22 04/13/2021   AST 25 04/13/2021   ALKPHOS 71 04/13/2021   BILITOT 1.1 04/13/2021   No results found for: "25OHVITD2", "25OHVITD3", "VD25OH"   Review of Systems  Constitutional:  Negative for appetite change, chills, diaphoresis, fatigue and unexpected weight change.  HENT:  Negative for hearing loss, tinnitus, trouble swallowing and voice change.   Eyes:  Negative for visual disturbance.  Respiratory:  Negative for choking, shortness of breath and wheezing.   Cardiovascular:  Negative for chest pain, palpitations and leg swelling.  Gastrointestinal:  Negative for abdominal pain, blood in stool, constipation and diarrhea.  Genitourinary:  Negative for difficulty urinating, dysuria and frequency.  Musculoskeletal:  Negative for arthralgias, back pain and  myalgias.  Skin:  Positive for rash. Negative for color change.  Neurological:  Negative for dizziness, syncope and headaches.  Hematological:  Negative for adenopathy.  Psychiatric/Behavioral:  Negative for dysphoric mood and sleep disturbance. The patient has insomnia. The patient is not nervous/anxious.     Patient Active Problem List   Diagnosis Date Noted   Chronic deep vein thrombosis (DVT) of brachial vein of left upper extremity ([place]) 12/28/2021   Contrast media allergy 12/12/2021   GERD (gastroesophageal reflux disease) 12/12/2021   Hemorrhoid 12/12/2021   Ischemic cardiomyopathy 12/12/2021   Neck muscle spasm 12/26/2020   Urinary hesitancy 12/26/2020   Carpal tunnel syndrome, bilateral 41/28/7867   Chronic systolic congestive heart failure ([place]) 09/29/2020   Thrombophilia ([place]) 09/29/2020   PAF (paroxysmal atrial fibrillation) ([place]) 04/11/2020   PAD (peripheral artery disease) ([place]) 03/31/2019   Rectal polyp    Impingement syndrome of left shoulder region 10/14/2018   Coronary artery disease of native artery of native heart with stable angina pectoris ([place]) 10/03/2018   Essential hypertension 06/19/2017   Mixed hyperlipidemia 06/19/2017   Overweight (BMI 25.0-29.9) 06/19/2017   BPH (benign prostatic hyperplasia) 06/19/2017   ED (erectile dysfunction) 06/19/2017   MCI (mild cognitive impairment) 06/19/2017   Lumbar spondylosis with myelopathy 11/08/2016   Hand dermatitis 07/26/2014   Hx of transient ischemic attack (TIA) 2011    Allergies  Allergen Reactions   Codeine Hives   Shellfish Allergy Hives and Swelling   Contrast Media [Iodinated Contrast Media] Itching and Other (See Comments)    Whelts on tongue   Fish Allergy Itching    Past Surgical History:  Procedure Laterality Date   ANAL FISTULECTOMY N/A 12/21/2014   Procedure: FISTULECTOMY ANAL;  Surgeon: Christene Lye, MD;  Location: [facility];  Service: General;  Laterality: N/A;   APPENDECTOMY  1975    CARDIAC CATHETERIZATION     COLONOSCOPY WITH PROPOFOL N/A 02/12/2019   Procedure: COLONOSCOPY WITH PROPOFOL;  Surgeon: Lucilla Lame, MD;  Location: [facility];  Service: Endoscopy;  Laterality: N/A;   CORONARY STENT INTERVENTION N/A 09/09/2017   Procedure: CORONARY STENT INTERVENTION;  Surgeon: Wellington Hampshire, MD;  Location: [facility];  Service: Cardiovascular;  Laterality: N/A;   CORONARY/GRAFT ACUTE MI REVASCULARIZATION N/A 07/18/2017   Procedure: Coronary/Graft Acute MI Revascularization;  Surgeon: Wellington Hampshire, MD;  Location: [facility];  Service: Cardiovascular;  Laterality: N/A;   HEMORRHOID SURGERY N/A 12/21/2014   Procedure: HEMORRHOIDECTOMY;  Surgeon: Christene Lye, MD;  Location: [facility];  Service: General;  Laterality: N/A;   INTRAVASCULAR ULTRASOUND/IVUS N/A 09/09/2017   Procedure: Intravascular Ultrasound/IVUS;  Surgeon: Wellington Hampshire, MD;  Location: [facility];  Service: Cardiovascular;  Laterality: N/A;   KNEE SURGERY Right age 77   LEFT HEART CATH AND CORONARY ANGIOGRAPHY N/A 07/18/2017   Procedure: LEFT HEART CATH AND CORONARY ANGIOGRAPHY;  Surgeon: Wellington Hampshire, MD;  Location: [facility];  Service: Cardiovascular;  Laterality: N/A;   LEFT HEART CATH AND CORONARY ANGIOGRAPHY Left 08/19/2017   Procedure: LEFT HEART CATH AND CORONARY ANGIOGRAPHY;  Surgeon: Wellington Hampshire, MD;  Location: [facility];  Service: Cardiovascular;  Laterality: Left;   LEFT HEART CATH AND CORONARY ANGIOGRAPHY N/A 11/07/2017   Procedure: LEFT HEART CATH AND CORONARY ANGIOGRAPHY;  Surgeon: Wellington Hampshire, MD;  Location: [facility];  Service: Cardiovascular;  Laterality: N/A;   LEFT HEART CATH AND CORONARY ANGIOGRAPHY N/A 10/17/2021   Procedure: LEFT HEART CATH AND CORONARY ANGIOGRAPHY;  Surgeon: Wellington Hampshire, MD;  Location: [facility];  Service: Cardiovascular;  Laterality: N/A;   ROTATOR CUFF REPAIR  Right 2013    Social History   Tobacco Use   Smoking status: Former    Types: Cigarettes    Quit date: 07/24/2007    Years since quitting: 14.7    Passive exposure: Past   Smokeless tobacco: Never   Tobacco comments:    smoking cessation materials not required  Vaping Use   Vaping Use: Never used  Substance Use Topics   Alcohol use: Yes    Alcohol/week: 5.0 - 6.0 standard drinks of alcohol    Types: 5 - 6 Cans of beer per week    Comment:  weekly   Drug use: No     Medication list has been reviewed and updated.  Current Meds  Medication Sig   amLODipine (NORVASC) 5 MG tablet Take 5 mg by mouth daily.   aspirin EC 81 MG tablet Take 81 mg by mouth daily.    atorvastatin (LIPITOR) 80 MG tablet TAKE 1 TABLET EVERY DAY AT 6:00PM   carvedilol (COREG) 3.125 MG tablet Take 1 tablet (3.125 mg total) by mouth 2 (two) times daily with a meal.   clopidogrel (PLAVIX) 75 MG tablet TAKE 1 TABLET EVERY DAY   ezetimibe (ZETIA) 10 MG tablet TAKE 1 TABLET EVERY DAY   furosemide (LASIX) 20 MG tablet TAKE 1 TABLET EVERY DAY   nitroGLYCERIN (NITROSTAT) 0.4 MG SL tablet Place 1 tablet (0.4 mg total) under the tongue every 5 (five) minutes as needed for chest pain.   potassium chloride (KLOR-CON) 10 MEQ tablet TAKE 1 TABLET (10 MEQ TOTAL) BY MOUTH DAILY.   sacubitril-valsartan (ENTRESTO) 24-26 MG Take 1 tablet by mouth 2 (two) times daily.   sildenafil (REVATIO) 20 MG tablet Take 1 tablet (20 mg total) by mouth 3 (three) times daily as needed.   tamsulosin (FLOMAX) 0.4 MG CAPS capsule Take 1 capsule (0.4 mg total) by mouth daily.       04/17/2022    8:50 AM 12/28/2021    9:21 AM 11/07/2021    8:46 AM 09/06/2021    9:53 AM  GAD 7 : Generalized Anxiety Score  Nervous, Anxious, on Edge 1 0 0 0  Control/stop worrying 1 0 0 0  Worry too much - different things 1 0 0 0  Trouble relaxing _0 Restless _1 0  Easily annoyed or irritable 1 0 1 0  Afraid - awful might happen 0 0 0 0  Total GAD  7 Score _2 Anxiety Difficulty Not difficult at all Not difficult at all Not difficult at all Not difficult at all       04/17/2022    8:50 AM 12/28/2021    9:21 AM 12/20/2021    1:41 PM  Depression screen PHQ 2/9  Decreased Interest 1 0 0  Down, Depressed, Hopeless 0 0 0  PHQ - 2 Score 1 0 0  Altered sleeping _3 Tired, decreased energy 1 1 0  Change in appetite 0 0  0  Feeling bad or failure about yourself  0 0 0  Trouble concentrating 2 0 0  Moving slowly or fidgety/restless 1 0 0  Suicidal thoughts 0 0 0  PHQ-9 Score _0 Difficult doing work/chores Not difficult at all Not difficult at all Not difficult at all    BP Readings from Last 3 Encounters:  04/17/22 126/78  02/16/22 138/72  02/12/22 138/70    Physical Exam Vitals and nursing note reviewed.  Constitutional:      Appearance: Normal appearance. He is well-developed.  HENT:     Head: Normocephalic.     Right Ear: Tympanic membrane, ear canal and external ear normal.     Left Ear: Tympanic membrane, ear canal and external ear normal.     Nose: Nose normal.  Eyes:     Conjunctiva/sclera: Conjunctivae normal.     Pupils: Pupils are equal, round, and reactive to light.  Neck:     Thyroid: No thyromegaly.     Vascular: No carotid bruit.  Cardiovascular:     Rate and Rhythm: Normal rate and regular rhythm.     Heart sounds: Normal heart sounds.  Pulmonary:     Effort: Pulmonary effort is normal.     Breath sounds: Normal breath sounds. No wheezing.  Chest:  Breasts:    Right: No mass.     Left: No mass.  Abdominal:     General: Bowel sounds are normal.     Palpations: Abdomen is soft.     Tenderness: There is no abdominal tenderness.  Musculoskeletal:        General: Normal range of motion.     Cervical back: Normal range of motion and neck supple.  Lymphadenopathy:     Cervical: No cervical adenopathy.  Skin:    General: Skin is warm and dry.     Findings: Rash present.     Comments:  Circular lesions on lower legs  Neurological:     Mental Status: He is alert and oriented to person, place, and time.     Deep Tendon Reflexes: Reflexes are normal and symmetric.  Psychiatric:        Attention and Perception: Attention normal.        Mood and Affect: Mood normal.        Thought Content: Thought content normal.     Wt Readings from Last 3 Encounters:  04/17/22 179 lb (81.2 kg)  02/16/22 177 lb (80.3 kg)  02/12/22 178 lb 2 oz (80.8 kg)    BP 126/78   Pulse 68   Ht _1  (1.702 m)   Wt 179 lb (81.2 kg)   SpO2 97%   BMI 28.04 kg/m   Assessment and Plan: 1. Annual physical exam Normal exam He has concerns about memory issues/concentration but is also not sleeping.   Will address insomnia and then reassess  2. Essential hypertension Clinically stable exam with well controlled BP. Tolerating medications without side effects at this time. Pt to continue current regimen and low sodium diet; benefits of regular exercise as able discussed. - CBC with Differential/Platelet - Urinalysis, Routine w reflex microscopic  3. Coronary artery disease of native artery of native heart with stable angina pectoris ([place]) Stable; follow up with Cardiology Consider Crestor for better lipid control - Comprehensive metabolic panel - Lipid panel  4. Chronic deep vein thrombosis (DVT) of brachial vein of left upper extremity (H[facility]) Continue Eliquis  5. Mixed hyperlipidemia - Lipid panel  6. Thrombophilia (  [place]) - CBC with Differential/Platelet  7. Benign prostatic hyperplasia with nocturia DRE deferred - PSA  8. Prediabetes Continue working on diet, continue daily exercise - Hemoglobin A1c  9. Tinea corporis - clotrimazole-betamethasone (LOTRISONE) cream; Apply 1 Application topically 2 (two) times daily.  Dispense: 45 g; Refill: 1  10. Primary insomnia Nightly just before bed. Follow up in 2 months. Reduce beer consumption - temazepam (RESTORIL) 15 MG capsule;  Take 1 capsule (15 mg total) by mouth at bedtime as needed for sleep.  Dispense: 30 capsule; Refill: 0   Partially dictated using Editor, commissioning. Any errors are unintentional.  Halina Maidens, MD [facility]  04/17/2022

## 2022-04-18 ENCOUNTER — Ambulatory Visit: Payer: Medicare HMO | Attending: Physician Assistant

## 2022-04-18 DIAGNOSIS — I4891 Unspecified atrial fibrillation: Secondary | ICD-10-CM

## 2022-04-18 LAB — COMPREHENSIVE METABOLIC PANEL
ALT: 36 IU/L (ref 0–44)
AST: 33 IU/L (ref 0–40)
Albumin/Globulin Ratio: 1.9 (ref 1.2–2.2)
Albumin: 4.7 g/dL (ref 3.9–4.9)
Alkaline Phosphatase: 61 IU/L (ref 44–121)
BUN/Creatinine Ratio: 10 (ref 10–24)
BUN: 11 mg/dL (ref 8–27)
Bilirubin Total: 1 mg/dL (ref 0.0–1.2)
CO2: 23 mmol/L (ref 20–29)
Calcium: 9.6 mg/dL (ref 8.6–10.2)
Chloride: 101 mmol/L (ref 96–106)
Creatinine, Ser: 1.05 mg/dL (ref 0.76–1.27)
Globulin, Total: 2.5 g/dL (ref 1.5–4.5)
Glucose: 116 mg/dL — ABNORMAL HIGH (ref 70–99)
Potassium: 4.6 mmol/L (ref 3.5–5.2)
Sodium: 141 mmol/L (ref 134–144)
Total Protein: 7.2 g/dL (ref 6.0–8.5)
eGFR: 77 mL/min/{1.73_m2} (ref >59)

## 2022-04-18 LAB — ECHOCARDIOGRAM LIMITED
Area-P 1/2: 3.99 cm2
Calc EF: 42.7 %
S' Lateral: 4 cm
Single Plane A2C EF: 42 %
Single Plane A4C EF: 40.3 %

## 2022-04-18 LAB — LIPID PANEL
Chol/HDL Ratio: 2.6 ratio (ref 0.0–5.0)
Cholesterol, Total: 166 mg/dL (ref 100–199)
HDL: 63 mg/dL (ref >39)
LDL Chol Calc (NIH): 92 mg/dL (ref 0–99)
Triglycerides: 55 mg/dL (ref 0–149)
VLDL Cholesterol Cal: 11 mg/dL (ref 5–40)

## 2022-04-18 LAB — URINALYSIS, ROUTINE W REFLEX MICROSCOPIC
Bilirubin, UA: NEGATIVE
Glucose, UA: NEGATIVE
Ketones, UA: NEGATIVE
Leukocytes,UA: NEGATIVE
Nitrite, UA: NEGATIVE
Protein,UA: NEGATIVE
RBC, UA: NEGATIVE
Specific Gravity, UA: 1.015 (ref 1.005–1.030)
Urobilinogen, Ur: 1 mg/dL (ref 0.2–1.0)
pH, UA: 6 (ref 5.0–7.5)

## 2022-04-18 LAB — CBC WITH DIFFERENTIAL/PLATELET
Basophils Absolute: 0.1 10*3/uL (ref 0.0–0.2)
Basos: 1 %
EOS (ABSOLUTE): 0.3 10*3/uL (ref 0.0–0.4)
Eos: 6 %
Hematocrit: 42.6 % (ref 37.5–51.0)
Hemoglobin: 14.2 g/dL (ref 13.0–17.7)
Immature Grans (Abs): 0 10*3/uL (ref 0.0–0.1)
Immature Granulocytes: 0 %
Lymphocytes Absolute: 2.9 10*3/uL (ref 0.7–3.1)
Lymphs: 51 %
MCH: 29.2 pg (ref 26.6–33.0)
MCHC: 33.3 g/dL (ref 31.5–35.7)
MCV: 88 fL (ref 79–97)
Monocytes Absolute: 0.6 10*3/uL (ref 0.1–0.9)
Monocytes: 11 %
Neutrophils Absolute: 1.8 10*3/uL (ref 1.4–7.0)
Neutrophils: 31 %
Platelets: 276 10*3/uL (ref 150–450)
RBC: 4.87 x10E6/uL (ref 4.14–5.80)
RDW: 14.1 % (ref 11.6–15.4)
WBC: 5.7 10*3/uL (ref 3.4–10.8)

## 2022-04-18 LAB — PSA: Prostate Specific Ag, Serum: 2 ng/mL (ref 0.0–4.0)

## 2022-04-18 LAB — HEMOGLOBIN A1C
Est. average glucose Bld gHb Est-mCnc: 117 mg/dL
Hgb A1c MFr Bld: 5.7 % — ABNORMAL HIGH (ref 4.8–5.6)

## 2022-04-19 ENCOUNTER — Other Ambulatory Visit: Payer: Self-pay | Admitting: Internal Medicine

## 2022-04-19 ENCOUNTER — Telehealth: Payer: Self-pay | Admitting: *Deleted

## 2022-04-19 DIAGNOSIS — E782 Mixed hyperlipidemia: Secondary | ICD-10-CM

## 2022-04-19 DIAGNOSIS — I255 Ischemic cardiomyopathy: Secondary | ICD-10-CM

## 2022-04-19 DIAGNOSIS — I2510 Atherosclerotic heart disease of native coronary artery without angina pectoris: Secondary | ICD-10-CM

## 2022-04-19 DIAGNOSIS — I5042 Chronic combined systolic (congestive) and diastolic (congestive) heart failure: Secondary | ICD-10-CM

## 2022-04-19 MED ORDER — ROSUVASTATIN CALCIUM 40 MG PO TABS: 40.0000 mg | ORAL_TABLET | Freq: Every day | ORAL | 3 refills | Status: DC

## 2022-04-19 MED ORDER — SPIRONOLACTONE 25 MG PO TABS: 12.5000 mg | ORAL_TABLET | Freq: Every day | ORAL | 3 refills | Status: DC

## 2022-04-19 NOTE — Telephone Encounter (Signed)
Reviewed results and recommendations with patient and he verbalized understanding with no further questions at this time.  

## 2022-04-19 NOTE — Telephone Encounter (Signed)
-----   Message from Rise Mu, PA-C sent at 04/19/2022  6:42 AM EDT ----- Echo showed slightly low pump function, though stable when compared to prior echo, mildly stiffened heart, and no significant valvular abnormality.  Recommendations: -Add spironolactone 12.5 mg daily -Follow-up BMP 1 week after initiation of spironolactone -Follow-up as scheduled next month for further escalation of evidence-based medical therapy

## 2022-05-02 ENCOUNTER — Telehealth: Payer: Self-pay | Admitting: Physician Assistant

## 2022-05-02 NOTE — Telephone Encounter (Signed)
Patient called again to follow-up on medication.

## 2022-05-02 NOTE — Telephone Encounter (Signed)
Pt c/o medication issue:  1. Name of Medication: does not know the name of medication  2. How are you currently taking this medication (dosage and times per day)?   3. Are you having a reaction (difficulty breathing--STAT)? no  4. What is your medication issue? Patient calling to find out the name of his new medication and when he needs to get labs done

## 2022-05-02 NOTE — Telephone Encounter (Signed)
I called and spoke with the patient. I have advised him of Thurmond Butts, PA's recommendations as stated below based on his most recent echo to:  - start spironolactone 12.5 mg once daily  - repeat  BMP 1 week after starting  The patient advised he does not have this medication at home. I advised this was sent to his pharmacy on:   spironolactone (ALDACTONE) 25 MG tablet 45 tablet 3 04/19/2022 07/18/2022   Sig - Route: Take 0.5 tablets (12.5 mg total) by mouth daily. - Oral   Sent to pharmacy as: spironolactone (ALDACTONE) 25 MG tablet   E-Prescribing Status: Receipt confirmed by pharmacy (04/19/2022  3:39 PM EDT)    [facility], [address] AT [place]   I inquired if he has called the pharmacy to check on the RX and he has not. The patient will call [facility] to follow up.  I have again reminded him that he will need a repeat BMP 1 week after starting the medication.   He will call us back if the pharmacy did not receive his RX/ any issues with this at the pharmacy.  He was appreciative of the call back.

## 2022-05-02 NOTE — Telephone Encounter (Signed)
Reviewed the patient's chart- per a phone note dated 04/19/22:  ----- Message from Rise Mu, PA-C sent at 04/19/2022  6:42 AM EDT ----- Echo showed slightly low pump function, though stable when compared to prior echo, mildly stiffened heart, and no significant valvular abnormality.   Recommendations: -Add spironolactone 12.5 mg daily -Follow-up BMP 1 week after initiation of spironolactone -Follow-up as scheduled next month for further escalation of evidence-based medical therapy    April 19, 2022 Valora Corporal, RN     04/19/22  3:39 PM Note Reviewed results and recommendations with patient and he verbalized understanding with no further questions at this time.

## 2022-05-14 ENCOUNTER — Other Ambulatory Visit: Payer: Self-pay | Admitting: Cardiovascular Disease

## 2022-05-15 ENCOUNTER — Other Ambulatory Visit
Admission: RE | Admit: 2022-05-15 | Discharge: 2022-05-15 | Disposition: A | Discharge status: Home / Self Care | Payer: Medicare HMO | Attending: Physician Assistant | Admitting: Physician Assistant

## 2022-05-15 DIAGNOSIS — I255 Ischemic cardiomyopathy: Secondary | ICD-10-CM | POA: Diagnosis not present

## 2022-05-15 DIAGNOSIS — I2510 Atherosclerotic heart disease of native coronary artery without angina pectoris: Secondary | ICD-10-CM | POA: Diagnosis not present

## 2022-05-15 DIAGNOSIS — I5042 Chronic combined systolic (congestive) and diastolic (congestive) heart failure: Secondary | ICD-10-CM | POA: Diagnosis not present

## 2022-05-15 LAB — BASIC METABOLIC PANEL
Anion gap: 6 (ref 5–15)
BUN: 13 mg/dL (ref 8–23)
CO2: 24 mmol/L (ref 22–32)
Calcium: 9.3 mg/dL (ref 8.9–10.3)
Chloride: 107 mmol/L (ref 98–111)
Creatinine, Ser: 0.96 mg/dL (ref 0.61–1.24)
GFR, Estimated: >60 mL/min (ref >60)
Glucose, Bld: 101 mg/dL — ABNORMAL HIGH (ref 70–99)
Potassium: 4.5 mmol/L (ref 3.5–5.1)
Sodium: 137 mmol/L (ref 135–145)

## 2022-05-15 NOTE — Progress Notes (Signed)
Cardiology Office Note    Date:  05/18/2022   ID:  Todd Weaver, DOB 30-May-1951, MRN [id number]  PCP:  Glean Hess, MD  Cardiologist:  Kathlyn Sacramento, MD  Electrophysiologist:  None   Chief Complaint: Follow up  History of Present Illness:   Todd Weaver is a 71 y.o. male with history of CAD with inferior ST elevation MI complicated by ventricular fibrillation arrest in 06/2017 status post PCI/DES with overlapping drug-eluting stent placement to the RCA and A-fib post procedure status post staged IVUS guided DES to the proximal and ostial LAD in 08/2017, chronic combined systolic and diastolic CHF, CVA, PAD, HTN, HLD, prior tobacco use, and BPH who presents for follow-up of CAD and cardiomyopathy.   He was admitted in 06/2017 with an inferior ST elevation MI complicated by ventricular fibrillation arrest.  He underwent successful PCI/DES with 2 overlapping drug-eluting stents to the RCA.  LHC at that time also demonstrated significant ostial LAD stenosis, borderline significant proximal LAD stenosis, and moderate distal LAD stenosis.  LVEF was mildly reduced.  Postprocedure, he had A-fib that was successfully treated with amiodarone.  He underwent staged IVUS guided PCI/DES to the ostial and proximal LAD in 08/2017.  Repeat LHC in 10/2017 showed widely patent LAD and RCA stents with no significant restenosis.  The ramus branch was pinched by the LAD stent with 70% ostial stenosis.  LVEF was 50 to 55%.   He has known PAD that has been managed medically.  Noninvasive vascular studies in 01/2019 demonstrated  moderately reduced ABI in the 0.7 range bilaterally with diffuse moderate SFA disease worse on the left side.  He had repeat ABI done in 01/2020 which was stable overall.  Nuclear stress test in 08/2019 showed no evidence of ischemia with normal ejection fraction.  Echo in 09/2019 showed an EF of 45 to 50% with mild mitral regurgitation.   He was seen in the office by Dr. Rockey Situ on  01/05/2021, and noted some left-sided chest discomfort that radiated into his left arm.  Symptoms were typically noted when he was laying in the bed, laying on his left side.  These episodes felt similar to his prior angina.  He was without exertional symptoms.  He underwent Lexiscan MPI on 01/18/2021 which was overall low risk and showed no evidence of ischemia.  LVEF appeared visually normal, though calculated value was mildly reduced at 47%.   He was seen in the office on 10/12/2021 noting progression of left arm and shoulder discomfort as well as 1 episode of profound exhaustion with associated nausea after climbing a flight of stairs.  Symptoms did not feel exactly similar to his prior angina.  He was without significant swelling of the upper extremity.  Diagnostic LHC on 10/17/2021 demonstrated widely patent RCA and LAD stents with no significant restenosis.  Stable 70% stenosis in the ostial ramus.  Otherwise, no obstructive disease.  Mildly reduced LV systolic function and mildly elevated LVEDP were noted.  Medical therapy was recommended.  Subsequent left upper extremity venous duplex demonstrated a chronic thrombus involving one of the left brachial veins.  Case was discussed with the patient's primary cardiologist with no intervention indicated.  Echo on 11/28/2021 demonstrated an EF of 45 to 50%, global hypokinesis, grade 2 diastolic dysfunction, normal RV systolic function and ventricular cavity size, no significant valvular abnormalities, and an estimated right atrial pressure of 3 mmHg.   He was seen in the office in 11/2021 and was without symptoms of  angina or decompensation.  He did continue to note left arm pain was typically worse when at rest and improved with movement.  It was more noticeable at nighttime.  Given symptoms, and with ultrasound findings, he was referred to vascular surgery for further input with subsequent recommendation for conservative management.  Subsequent hypercoagulable  work-up was unrevealing.  With regards to his cardiomyopathy, we titrated Entresto to 49/51 mg twice daily.  He was last seen in the office in 01/2022 and remained without symptoms of angina or decompensation.  He noted significant constipation following titration of Entresto leading this to be decreased back to 24/26 mg twice daily.  Subsequent limited echo in 03/2022 demonstrated an EF of 45 to 50%, global hypokinesis, grade 2 diastolic dysfunction, normal RV systolic function, and no significant valvular abnormalities.  Given echo findings, he was initiated on spironolactone 12.5 mg daily.  Left upper extremity arterial ultrasound showed triphasic arterial flow throughout the bilateral upper extremities.  He comes in doing well from a cardiac perspective, and is without symptoms of angina or decompensation.  He does continue to note left arm pain that is worse with rest and improved with movement.  He also now notes bilateral paresthesias involving the distal aspects of his upper extremity digits.  There is no swelling or erythema of the upper extremities.  He does remain active at baseline and is currently building a carport without cardiac limitation.  Constipation has improved on a lower dose Entresto.  He has tolerated the addition of spironolactone without issues.  His weight remains stable.  He does drink 3-4 beers nightly, though is working on tapering this and plans to discontinue alcohol altogether by the first of the year.  No dyspnea, dizziness, presyncope, or syncope.  No falls or symptoms concerning for bleeding.  No lower extremity swelling.   Labs independently reviewed: 04/2022 - potassium 4.5, BUN 13, serum creatinine 0.96 03/2022 - TC 166, TG 55, HDL 63, LDL 92, A1c 5.7, albumin 4.7, AST/ALT normal, Hgb 14.2, PLT 276  Past Medical History:  Diagnosis Date   Acute ST elevation myocardial infarction (STEMI) of inferior wall ([place]) 07/18/2017   BPH (benign prostatic hyperplasia)  06/19/2017   Carpal tunnel syndrome of right wrist 12/26/2020   Chest pain 12/18/4130   Chronic systolic congestive heart failure ([place]) 09/29/2020   Contrast media allergy    Coronary artery disease of native artery of native heart with stable angina pectoris ([place]) 10/03/2018   cardiac catheterization April 2019 showed widely patent LAD and RCA stent with no significant restenosis.  The ramus branch was pinched by the LAD stent with 70% ostial stenosis.  EF was 50 to 55%  12/2020: Myoview: T wave inversion was noted during stress in the V6, V5, II, III and aVF  leads.   There was no ST segment deviation noted during stress.   The study is normal.   This is a low risk s   ED (erectile dysfunction) 06/19/2017   Essential hypertension 06/19/2017   GERD (gastroesophageal reflux disease)    Hand dermatitis 07/26/2014   Hemorrhoid    History of CVA (cerebrovascular accident) 06/19/2017   History of ST elevation myocardial infarction (STEMI) 07/18/2017   07/18/2017 s/p PCI/DES x 2 to the RCA with residual disease involving the distal left main/ostial LAD, proximal LAD, mid LAD, and LCx      Hyperlipidemia    Hypertension    Impingement syndrome of left shoulder region 10/14/2018   Ischemic cardiomyopathy  a. 06/2017 Echo: EF 45-50%, mod inf/infsept HK. Mildly dil LA; b. 09/2019 Echo: EF 45-50%, Gr2 DD. Mild MR.   Lumbar spondylosis with myelopathy 11/08/2016   MCI (mild cognitive impairment) 06/19/2017   Mixed hyperlipidemia 06/19/2017       Neck muscle spasm 12/26/2020   Overweight (BMI 25.0-29.9) 06/19/2017   PAD (peripheral artery disease) ([place])    a. 01/2019 ABIs and Duplex: ABI R 0.78, L 0.79; TBI R 0.66, L 1.11. RCIA >50p, RSFA 50-74. LCIA >50p, LSFA 75-99p, 30-49d. 3 vessel runoff bilat.   PAF (paroxysmal atrial fibrillation) ([place])    a. Brief episode during December hospitalization-->converted on IV amio-->not on [place].   Rectal polyp    Stroke [facility]) 2011   "2 MINI STROKES". no deficits.    Thrombophilia ([place]) 09/29/2020   Urinary hesitancy 12/26/2020    Past Surgical History:  Procedure Laterality Date   ANAL FISTULECTOMY N/A 12/21/2014   Procedure: FISTULECTOMY ANAL;  Surgeon: Christene Lye, MD;  Location: [facility] ORS;  Service: General;  Laterality: N/A;   APPENDECTOMY  1975   CARDIAC CATHETERIZATION     COLONOSCOPY WITH PROPOFOL N/A 02/12/2019   Procedure: COLONOSCOPY WITH PROPOFOL;  Surgeon: Lucilla Lame, MD;  Location: [facility] ENDOSCOPY;  Service: Endoscopy;  Laterality: N/A;   CORONARY STENT INTERVENTION N/A 09/09/2017   Procedure: CORONARY STENT INTERVENTION;  Surgeon: Wellington Hampshire, MD;  Location: [place] [facility];  Service: Cardiovascular;  Laterality: N/A;   CORONARY/GRAFT ACUTE MI REVASCULARIZATION N/A 07/18/2017   Procedure: Coronary/Graft Acute MI Revascularization;  Surgeon: Wellington Hampshire, MD;  Location: [place] [facility];  Service: Cardiovascular;  Laterality: N/A;   HEMORRHOID SURGERY N/A 12/21/2014   Procedure: HEMORRHOIDECTOMY;  Surgeon: Christene Lye, MD;  Location: [facility] ORS;  Service: General;  Laterality: N/A;   INTRAVASCULAR ULTRASOUND/IVUS N/A 09/09/2017   Procedure: Intravascular Ultrasound/IVUS;  Surgeon: Wellington Hampshire, MD;  Location: [place] [facility];  Service: Cardiovascular;  Laterality: N/A;   KNEE SURGERY Right age 57   LEFT HEART CATH AND CORONARY ANGIOGRAPHY N/A 07/18/2017   Procedure: LEFT HEART CATH AND CORONARY ANGIOGRAPHY;  Surgeon: Wellington Hampshire, MD;  Location: [place] [facility];  Service: Cardiovascular;  Laterality: N/A;   LEFT HEART CATH AND CORONARY ANGIOGRAPHY Left 08/19/2017   Procedure: LEFT HEART CATH AND CORONARY ANGIOGRAPHY;  Surgeon: Wellington Hampshire, MD;  Location: [place] [facility];  Service: Cardiovascular;  Laterality: Left;   LEFT HEART CATH AND CORONARY ANGIOGRAPHY N/A 11/07/2017   Procedure: LEFT HEART CATH AND CORONARY ANGIOGRAPHY;  Surgeon: Wellington Hampshire, MD;  Location: [place] [facility];  Service: Cardiovascular;  Laterality: N/A;   LEFT HEART CATH AND CORONARY ANGIOGRAPHY N/A 10/17/2021   Procedure: LEFT HEART CATH AND CORONARY ANGIOGRAPHY;  Surgeon: Wellington Hampshire, MD;  Location: [place] [facility];  Service: Cardiovascular;  Laterality: N/A;   ROTATOR CUFF REPAIR Right 2013    Current Medications: Current Meds  Medication Sig   amLODipine (NORVASC) 5 MG tablet Take 5 mg by mouth daily.   aspirin EC 81 MG tablet Take 81 mg by mouth daily.    carvedilol (COREG) 3.125 MG tablet TAKE 1 TABLET TWICE DAILY WITH MEALS   clopidogrel (PLAVIX) 75 MG tablet TAKE 1 TABLET EVERY DAY   clotrimazole-betamethasone (LOTRISONE) cream Apply 1 Application topically 2 (two) times daily.   ezetimibe (ZETIA) 10 MG tablet TAKE 1 TABLET EVERY DAY   furosemide (LASIX) 20 MG tablet TAKE 1 TABLET EVERY DAY  nitroGLYCERIN (NITROSTAT) 0.4 MG SL tablet Place 1 tablet (0.4 mg total) under the tongue every 5 (five) minutes as needed for chest pain.   rosuvastatin (CRESTOR) 40 MG tablet Take 1 tablet (40 mg total) by mouth daily.   sacubitril-valsartan (ENTRESTO) 24-26 MG Take 1 tablet by mouth 2 (two) times daily.   sildenafil (REVATIO) 20 MG tablet Take 1 tablet (20 mg total) by mouth 3 (three) times daily as needed.   spironolactone (ALDACTONE) 25 MG tablet Take 1 tablet (25 mg total) by mouth daily.   tamsulosin (FLOMAX) 0.4 MG CAPS capsule Take 1 capsule (0.4 mg total) by mouth daily.   temazepam (RESTORIL) 15 MG capsule Take 1 capsule (15 mg total) by mouth at bedtime as needed for sleep.   [DISCONTINUED] potassium chloride (KLOR-CON) 10 MEQ tablet TAKE 1 TABLET (10 MEQ TOTAL) BY MOUTH DAILY.   [DISCONTINUED] spironolactone (ALDACTONE) 25 MG tablet Take 0.5 tablets (12.5 mg total) by mouth daily.    Allergies:   Codeine, Shellfish allergy, Contrast media [iodinated contrast media], and Fish allergy   Social History   Socioeconomic History   Marital status: Married     Spouse name: Not on file   Number of children: 2   Years of education: Not on file   Highest education level: 11th grade  Occupational History   Occupation: Retired  Tobacco Use   Smoking status: Former    Types: Cigarettes    Quit date: 07/24/2007    Years since quitting: 14.8    Passive exposure: Past   Smokeless tobacco: Never   Tobacco comments:    smoking cessation materials not required  Vaping Use   Vaping Use: Never used  Substance and Sexual Activity   Alcohol use: Yes    Alcohol/week: 5.0 - 6.0 standard drinks of alcohol    Types: 5 - 6 Cans of beer per week    Comment:  weekly   Drug use: No   Sexual activity: Not Currently  Other Topics Concern   Not on file  Social History Narrative   Lives in [place]. Retired.  Completed cardiac rehab program.   Social Determinants of Health   Financial Resource Strain: Low Risk  (12/20/2021)   Overall Financial Resource Strain (CARDIA)    Difficulty of Paying Living Expenses: Not hard at all  Food Insecurity: No Food Insecurity (12/20/2021)   Hunger Vital Sign    Worried About Running Out of Food in the Last Year: Never true    Ran Out of Food in the Last Year: Never true  Transportation Needs: No Transportation Needs (12/20/2021)   PRAPARE - Hydrologist (Medical): No    Lack of Transportation (Non-Medical): No  Physical Activity: Inactive (12/20/2021)   Exercise Vital Sign    Days of Exercise per Week: 0 days    Minutes of Exercise per Session: 0 min  Stress: No Stress Concern Present (12/20/2021)   [place]    Feeling of Stress : Not at all  Social Connections: Moderately Integrated (12/20/2021)   Social Connection and Isolation Panel [NHANES]    Frequency of Communication with Friends and Family: More than three times a week    Frequency of Social Gatherings with Friends and Family: Three times a week    Attends Religious  Services: More than 4 times per year    Active Member of Clubs or Organizations: No    Attends Archivist Meetings:  Never    Marital Status: Married     Family History:  The patient's family history includes Alzheimer's disease in his father and mother; Hypertension in his father and mother.  ROS:   12-point review of systems is negative unless otherwise noted in the HPI.   EKGs/Labs/Other Studies Reviewed:    Studies reviewed were summarized above. The additional studies were reviewed today:  Limited echo 04/18/2022: 1. Left ventricular ejection fraction, by estimation, is 45 to 50%. The  left ventricle has mildly decreased function. The left ventricle  demonstrates global hypokinesis. Left ventricular diastolic parameters are  consistent with Grade II diastolic  dysfunction (pseudonormalization).   2. Right ventricular systolic function is normal.   3. The mitral valve is normal in structure. No evidence of mitral valve  regurgitation.   Comparison(s): Previous LVEF reported as 45-50%  Previous GLS reported as -17.5.  __________  Elwyn Reach patch 11/2021: Patient had a min HR of 42 bpm, max HR of 136 bpm, and avg HR of 66 bpm. Predominant underlying rhythm was Sinus Rhythm.  1 run of Supraventricular Tachycardia occurred lasting 6 beats with a max rate of 135 bpm (avg 105 bpm). Rare PACs and rare PVCs with a burden of less than 1%. No significant arrhythmia overall. __________  2D echo 11/28/2021: 1. Left ventricular ejection fraction, by estimation, is 45 to 50%. The  left ventricle has mildly decreased function. The left ventricle  demonstrates global hypokinesis. Left ventricular diastolic parameters are  consistent with Grade II diastolic  dysfunction (pseudonormalization). The average left ventricular global  longitudinal strain is -17.5 %. The global longitudinal strain is normal.   2. Right ventricular systolic function is normal. The right ventricular  size is  normal.   3. The mitral valve is normal in structure. No evidence of mitral valve  regurgitation. No evidence of mitral stenosis.   4. The aortic valve is normal in structure. Aortic valve regurgitation is  not visualized. No aortic stenosis is present.   5. The inferior vena cava is normal in size with greater than 50%  respiratory variability, suggesting right atrial pressure of 3 mmHg. __________   LHC 10/17/2021:   Ost Ramus lesion is 70% stenosed.   Mid LAD lesion is 20% stenosed.   Prox Cx lesion is 30% stenosed.   Non-stenotic Prox LAD lesion was previously treated.   Non-stenotic Ost RCA to Mid RCA lesion was previously treated.   There is mild left ventricular systolic dysfunction.   LV end diastolic pressure is mildly elevated.   The left ventricular ejection fraction is 45-50% by visual estimate.   1.  Widely patent RCA and LAD stents with no significant restenosis.  Stable 70% stenosis in ostial ramus.  No other obstructive disease. 2.  Mildly reduced LV systolic function and mildly elevated left ventricular end-diastolic pressure.   Recommendations: No culprit is identified for the patient's left arm pain.  Continue aggressive medical therapy for coronary artery disease. __________   Carlton Adam MPI 01/18/2021: T wave inversion was noted during stress in the V6, V5, II, III and aVF leads. There was no ST segment deviation noted during stress. The study is normal. This is a low risk study. The left ventricular ejection fraction visually appears normal, although calculated value is mildly decreased at 47%. Correlation with echo advised. There is no evidence for ischemia. __________   ABIs 02/05/2020: Bilateral ABIs appear essentially unchanged compared to prior study on  01/26/19. ABIs and right TBI are consistent with previous  exam. Drop in left  TBI but still normal.     Summary:  Right: Resting right ankle-brachial index indicates moderate right lower  extremity  arterial disease. The right toe-brachial index is abnormal.   Left: Resting left ankle-brachial index indicates moderate left lower  extremity arterial disease. The left toe-brachial index is normal. __________   2D echo 09/29/2019: 1. Left ventricular ejection fraction, by estimation, is 45 to 50%. The  left ventricle has mildly decreased function. The left ventricle  demonstrates global hypokinesis. Left ventricular diastolic parameters are  consistent with Grade II diastolic  dysfunction (pseudonormalization). Elevated left atrial pressure.   2. Right ventricular systolic function is normal. The right ventricular  size is normal. Tricuspid regurgitation signal is inadequate for assessing  PA pressure.   3. The mitral valve is grossly normal. Mild mitral valve regurgitation.  No evidence of mitral stenosis.   4. The aortic valve is tricuspid. Aortic valve regurgitation is not  visualized. No aortic stenosis is present.   5. Mildly dilated pulmonary artery.   6. The inferior vena cava is normal in size with greater than 50%  respiratory variability, suggesting right atrial pressure of 3 mmHg. __________   2D echo 08/24/2019: There was no ST segment deviation noted during stress. The study is normal. This is a low risk study. The left ventricular ejection fraction is normal (55-65%). __________   LHC 11/07/2017: Mid LAD lesion is 30% stenosed. Previously placed Prox LAD drug eluting stent is widely patent. Prox Cx lesion is 40% stenosed. Non-stenotic Ost RCA to Mid RCA lesion previously treated. Ost Ramus lesion is 70% stenosed. Dist LAD lesion is 40% stenosed. LV end diastolic pressure is mildly elevated. There is mild left ventricular systolic dysfunction. The left ventricular ejection fraction is 50-55% by visual estimate.   1.  Widely patent LAD and RCA stents with no significant restenosis.  There is a relatively small size ramus br which is pinched by the LAD stent with 70%  ostial stenosis.  No other obstructive disease. 2.  Low normal LV systolic function with an EF of 50-55% with mild inferior wall hypokinesis.  Mildly elevated left ventricular end-diastolic pressure.   Recommendations: Continue medical therapy.  Add sublingual nitroglycerin to be used as needed. The patient can be discharged home from a cardiac standpoint. __________   [facility] 09/09/2017: Dist LM to Ost LAD lesion is 85% stenosed. Prox LAD lesion is 60% stenosed. Mid LAD lesion is 30% stenosed. Prox Cx lesion is 40% stenosed. Non-stenotic Ost RCA to Mid RCA lesion previously treated. Ost Ramus lesion is 50% stenosed. Post intervention, there is a 0% residual stenosis. Post intervention, there is a 0% residual stenosis. A drug-eluting stent was successfully placed using a STENT SIERRA 3.00 X 38 MM.   Successful IVUS guided drug-eluting stent placement to the proximal and ostial LAD with one long stent.   Recommendations: Dual antiplatelet therapy for at least one year.  Aggressive treatment of risk factors. __________   LHC 08/19/2017: Dist LM to Ost LAD lesion is 85% stenosed. Prox LAD lesion is 60% stenosed. Mid LAD lesion is 30% stenosed. Prox Cx lesion is 40% stenosed. Previously placed Ost RCA to Mid RCA drug eluting stent and stent (unknown type) is widely patent. Balloon angioplasty was performed. Ost Ramus lesion is 50% stenosed. There is mild left ventricular systolic dysfunction. LV end diastolic pressure is normal. The left ventricular ejection fraction is 45-50% by visual estimate.   1.  Significant underlying two-vessel coronary artery  disease with widely patent stents in the right coronary artery without any restenosis.  Significant ostial LAD stenosis is unchanged with moderate disease in the proximal and mid segment. 2.  Mildly reduced LV systolic function with an EF of 45% with inferior wall hypokinesis.  Normal left ventricular end-diastolic pressure.    Recommendations: Continue medical therapy for now.  I will discuss with my colleagues about management options of ostial LAD stenosis with PCI versus one-vessel CABG. __________   2D echo 07/18/2017: - Left ventricle: The cavity size was normal. There was mild    concentric hypertrophy. Systolic function was mildly reduced. The    estimated ejection fraction was in the range of 45% to 50%.    Moderate hypokinesis of the inferior and inferoseptal myocardium.    The study was not technically sufficient to allow evaluation of    LV diastolic dysfunction due to atrial fibrillation.  - Left atrium: The atrium was mildly dilated. __________   [facility] 09/18/2016: Ost RCA to Mid RCA lesion is 99% stenosed. A drug-eluting stent was successfully placed using a STENT SIERRA 3.00 X 38 MM. Post intervention, there is a 0% residual stenosis. A stent was successfully placed. Post Atrio lesion is 60% stenosed. Dist LM to Ost LAD lesion is 85% stenosed. Prox Cx lesion is 40% stenosed. Prox LAD lesion is 60% stenosed. Mid LAD lesion is 40% stenosed.   1.  Significant two-vessel coronary artery disease with subtotal occlusion of the proximal right coronary artery which is the culprit for inferior ST elevation myocardial infarction.  There is also 85% ostial LAD stenosis.  There is a medium sized ramus branch with 60% ostial stenosis. 2.  Normal left ventricular end-diastolic pressure. 3.  Successful complex angioplasty and 2 overlapped drug-eluting stent placement to the proximal and ostial right coronary artery.   Recommendations: The patient had hypotension throughout PCI likely due to RV involvement.  He responded to IV fluids and norepinephrine drip.  Continue dual antiplatelet therapy for at least one year.  Aggressive treatment of risk factors. Further ischemic evaluation of the LAD will be required in the near future.  Angiography was suboptimal given the patient's significant tachycardia. This was  an overall difficult procedure due to long diffuse disease. __________   2D echo 04/09/2016: - Left ventricle: The cavity size was normal. Wall thickness was    normal. Systolic function was normal. The estimated ejection    fraction was in the range of 55% to 60%. Wall motion was normal;    there were no regional wall motion abnormalities. Left    ventricular diastolic function parameters were normal.   Impressions:   - Normal study. __________   Carlton Adam MPI 04/06/2016: There was no ST segment deviation noted during stress. No T wave inversion was noted during stress. This is a low risk study. The left ventricular ejection fraction is normal (55-65%). Defect 1: There is a small defect of mild severity present in the apex location. This is patrially reversible and suspected to be due to an artifact given intense GI uptake which affected the quality of study. Correlate clinically   EKG:  EKG is ordered today.  The EKG ordered today demonstrates NSR, 65 bpm, left axis deviation, nonspecific ST-T changes  Recent Labs: 11/07/2021: TSH 2.390 04/17/2022: ALT 36; Hemoglobin 14.2; Platelets 276 05/15/2022: BUN 13; Creatinine, Ser 0.96; Potassium 4.5; Sodium 137  Recent Lipid Panel    Component Value Date/Time   CHOL 166 04/17/2022 0918   TRIG 55 04/17/2022  0918   HDL 63 04/17/2022 0918   CHOLHDL 2.6 04/17/2022 0918   CHOLHDL 2.8 11/07/2017 0511   VLDL 4 11/07/2017 0511   LDLCALC 92 04/17/2022 0918   LDLDIRECT 116 (H) 09/24/2017 1338    PHYSICAL EXAM:    VS:  BP 120/72 (BP Location: Left Arm, Patient Position: Sitting, Cuff Size: Normal)   Pulse 65   Ht '5\' 7"'$  (1.702 m)   Wt 179 lb (81.2 kg)   SpO2 98%   BMI 28.04 kg/m   BMI: Body mass index is 28.04 kg/m.  Physical Exam Vitals reviewed.  Constitutional:      Appearance: He is well-developed.  HENT:     Head: Normocephalic and atraumatic.  Eyes:     General:        Right eye: No discharge.        Left eye: No  discharge.  Neck:     Vascular: No JVD.  Cardiovascular:     Rate and Rhythm: Normal rate and regular rhythm.     Pulses:          Radial pulses are 2+ on the right side and 2+ on the left side.       Posterior tibial pulses are 2+ on the right side and 2+ on the left side.     Heart sounds: Normal heart sounds, S1 normal and S2 normal. Heart sounds not distant. No midsystolic click and no opening snap. No murmur heard.    No friction rub.  Pulmonary:     Effort: Pulmonary effort is normal. No respiratory distress.     Breath sounds: Normal breath sounds. No decreased breath sounds, wheezing or rales.  Chest:     Chest wall: No tenderness.  Abdominal:     General: There is no distension.  Musculoskeletal:     Cervical back: Normal range of motion.     Right lower leg: No edema.     Left lower leg: No edema.  Skin:    General: Skin is warm and dry.     Nails: There is no clubbing.  Neurological:     Mental Status: He is alert and oriented to person, place, and time.  Psychiatric:        Speech: Speech normal.        Behavior: Behavior normal.        Thought Content: Thought content normal.        Judgment: Judgment normal.     Wt Readings from Last 3 Encounters:  05/18/22 179 lb (81.2 kg)  04/17/22 179 lb (81.2 kg)  02/16/22 177 lb (80.3 kg)     ASSESSMENT & PLAN:   CAD involving native coronary arteries without angina: He continues to be without symptoms of angina or decompensation.  Recent LHC in 09/2021 showed patent LAD and RCA stents with stable coronary artery disease.  No intervention was indicated.  Continue aggressive risk factor modification and secondary prevention including aspirin, clopidogrel, carvedilol, rosuvastatin, and ezetimibe.  No indication for further ischemic testing at this time.  Chronic combined systolic and diastolic CHF secondary to ICM: He appears euvolemic and well compensated with my J class I symptoms.  He remains very active at baseline and  is currently building a carport.  Continue current dose of Entresto and carvedilol.  Prior bradycardia precludes titration of beta-blocker.  Discontinue KCl.  Titrate spironolactone to 25 mg daily with a follow-up BMP 1 week thereafter.  He was previously noted to have constipation on higher  dose Entresto, therefore we will continue him on 24/26 mg twice daily of Entresto.  In follow-up, would consider addition of SGLT2 inhibitor.  CHF education along with tapering of alcohol use was discussed.  Lone A-fib: Noted following MI and VF arrest.  Subsequent outpatient cardiac monitoring showed no further evidence of atrial arrhythmia.  Not on anticoagulation given isolated episode in the context of significant acute illness.  Should recurrence of A-fib be identified, [place] will need to be revisited at that time.  HTN: Blood pressure is well controlled in the office today.  Continue medical therapy as outlined above.  HLD: LDL 92.  Now on rosuvastatin in place of atorvastatin along with ezetimibe.  Followed by PCP.  Goal LDL less than 70.  Chronic small left brachial thrombus: Hypercoagulable work-up reassuring.  Evaluated by vascular surgery.  Conservative therapy recommended.  Left upper extremity pain with bilateral upper extremity paresthesias: Cardiac cath in 09/2021 showed patent LAD and RCA stents with stable CAD.  Venous duplex did demonstrate a chronic DVT within 1 of the left brachial veins with conservative therapy recommended.  Patient has also been evaluated by vascular surgery for this.  Bilateral upper extremity arterial ultrasound demonstrated triphasic waves.  With the noted bilateral upper extremity paresthesias, we will refer to neurology.  Ongoing management per PCP.  Alcohol use: Patient drinks 3-4 beers nightly.  Tapering was recommended.  He indicates he plans to quit alcohol altogether by the first of the year.    Disposition: F/u with Dr. Fletcher Anon or an APP in 4 months.   Medication  Adjustments/Labs and Tests Ordered: Current medicines are reviewed at length with the patient today.  Concerns regarding medicines are outlined above. Medication changes, Labs and Tests ordered today are summarized above and listed in the Patient Instructions accessible in Encounters.   Signed, [name], PA-C 05/18/2022 11:34 AM     [facility] [address] [place], [place] [address] [phone]

## 2022-05-18 ENCOUNTER — Ambulatory Visit: Payer: Medicare HMO | Attending: Physician Assistant | Admitting: Physician Assistant

## 2022-05-18 ENCOUNTER — Encounter: Payer: Self-pay | Admitting: Physician Assistant

## 2022-05-18 VITALS — BP 120/72 | HR 65 | Ht 67.0 in | Wt 179.0 lb

## 2022-05-18 DIAGNOSIS — R200 Anesthesia of skin: Secondary | ICD-10-CM

## 2022-05-18 DIAGNOSIS — I255 Ischemic cardiomyopathy: Secondary | ICD-10-CM | POA: Diagnosis not present

## 2022-05-18 DIAGNOSIS — E785 Hyperlipidemia, unspecified: Secondary | ICD-10-CM | POA: Diagnosis not present

## 2022-05-18 DIAGNOSIS — I82729 Chronic embolism and thrombosis of deep veins of unspecified upper extremity: Secondary | ICD-10-CM

## 2022-05-18 DIAGNOSIS — I4891 Unspecified atrial fibrillation: Secondary | ICD-10-CM | POA: Diagnosis not present

## 2022-05-18 DIAGNOSIS — I10 Essential (primary) hypertension: Secondary | ICD-10-CM

## 2022-05-18 DIAGNOSIS — I5042 Chronic combined systolic (congestive) and diastolic (congestive) heart failure: Secondary | ICD-10-CM

## 2022-05-18 DIAGNOSIS — I2510 Atherosclerotic heart disease of native coronary artery without angina pectoris: Secondary | ICD-10-CM

## 2022-05-18 DIAGNOSIS — M79602 Pain in left arm: Secondary | ICD-10-CM

## 2022-05-18 DIAGNOSIS — G8929 Other chronic pain: Secondary | ICD-10-CM

## 2022-05-18 MED ORDER — SPIRONOLACTONE 25 MG PO TABS: 25.0000 mg | ORAL_TABLET | Freq: Every day | ORAL | 3 refills | Status: DC

## 2022-05-18 NOTE — Patient Instructions (Addendum)
Medication Instructions:  Your physician has recommended you make the following change in your medication:   STOP Potassium INCREASE Spironolactone 25 mg once daily   *If you need a refill on your cardiac medications before your next appointment, please call your pharmacy*   Lab Work: BMET in one week. No appointment is needed. Just go the below location:   [place] at [facility] 1st desk on the right to check in (REGISTRATION)  Lab hours: Monday- Friday (7:30 am- 5:30 pm)  If you have labs (blood work) drawn today and your tests are completely normal, you will receive your results only by: MyChart Message (if you have MyChart) OR A paper copy in the mail If you have any lab test that is abnormal or we need to change your treatment, we will call you to review the results.   Testing/Procedures: None   Follow-Up: At [facility], you and your health needs are our priority.  As part of our continuing mission to provide you with exceptional heart care, we have created designated Provider Care Teams.  These Care Teams include your primary Cardiologist (physician) and Advanced Practice Providers (APPs -  Physician Assistants and Nurse Practitioners) who all work together to provide you with the care you need, when you need it.   Your next appointment:   4 month(s)  The format for your next appointment:   In Person  Provider:   Kathlyn Sacramento, MD or [name], PA-C    Other Instructions Referral placed for Neurology. Appointment scheduled for 05/24/22 at 10:00 am at the [facility] in [place] location. If any schedule changes you will need to call [phone]  Important Information About Sugar

## 2022-05-21 ENCOUNTER — Encounter (INDEPENDENT_AMBULATORY_CARE_PROVIDER_SITE_OTHER): Payer: Self-pay

## 2022-05-24 DIAGNOSIS — R29898 Other symptoms and signs involving the musculoskeletal system: Secondary | ICD-10-CM | POA: Diagnosis not present

## 2022-05-24 DIAGNOSIS — G479 Sleep disorder, unspecified: Secondary | ICD-10-CM | POA: Diagnosis not present

## 2022-05-24 DIAGNOSIS — M79602 Pain in left arm: Secondary | ICD-10-CM | POA: Diagnosis not present

## 2022-05-24 DIAGNOSIS — R200 Anesthesia of skin: Secondary | ICD-10-CM | POA: Diagnosis not present

## 2022-05-28 DIAGNOSIS — M5412 Radiculopathy, cervical region: Secondary | ICD-10-CM | POA: Diagnosis not present

## 2022-05-28 DIAGNOSIS — M542 Cervicalgia: Secondary | ICD-10-CM | POA: Diagnosis not present

## 2022-06-18 ENCOUNTER — Encounter: Payer: Self-pay | Admitting: Internal Medicine

## 2022-06-18 ENCOUNTER — Ambulatory Visit (INDEPENDENT_AMBULATORY_CARE_PROVIDER_SITE_OTHER): Payer: Medicare HMO | Admitting: Internal Medicine

## 2022-06-18 VITALS — BP 114/78 | HR 92 | Ht 67.0 in | Wt 177.8 lb

## 2022-06-18 DIAGNOSIS — F5101 Primary insomnia: Secondary | ICD-10-CM | POA: Diagnosis not present

## 2022-06-18 DIAGNOSIS — I25118 Atherosclerotic heart disease of native coronary artery with other forms of angina pectoris: Secondary | ICD-10-CM | POA: Diagnosis not present

## 2022-06-18 DIAGNOSIS — E785 Hyperlipidemia, unspecified: Secondary | ICD-10-CM

## 2022-06-18 DIAGNOSIS — I10 Essential (primary) hypertension: Secondary | ICD-10-CM | POA: Diagnosis not present

## 2022-06-18 MED ORDER — CLOPIDOGREL BISULFATE 75 MG PO TABS: 75.0000 mg | ORAL_TABLET | Freq: Every day | ORAL | 3 refills | Status: DC

## 2022-06-18 MED ORDER — TEMAZEPAM 15 MG PO CAPS: 15.0000 mg | ORAL_CAPSULE | Freq: Every evening | ORAL | 3 refills | Status: DC | PRN

## 2022-06-18 MED ORDER — NITROGLYCERIN 0.4 MG SL SUBL: 0.4000 mg | SUBLINGUAL_TABLET | SUBLINGUAL | 0 refills | Status: DC | PRN

## 2022-06-18 MED ORDER — EZETIMIBE 10 MG PO TABS: 10.0000 mg | ORAL_TABLET | Freq: Every day | ORAL | 3 refills | Status: DC

## 2022-06-18 NOTE — Progress Notes (Signed)
Date:  06/18/2022   Name:  Todd Weaver   DOB:  1951-05-29   MRN:  [id number]   Chief Complaint: Insomnia  Insomnia Primary symptoms: sleep disturbance, difficulty falling asleep, premature morning awakening.   The onset quality is undetermined. The problem occurs nightly. Past treatments include medication (started Temazepam last visit).  Hypertension This is a chronic problem. The problem is controlled. Pertinent negatives include no chest pain, headaches or shortness of breath. Past treatments include beta blockers and calcium channel blockers (enstresto and Spironolactone). Hypertensive end-organ damage includes heart failure.  Hyperlipidemia This is a chronic problem. Recent lipid tests were reviewed and are high. Pertinent negatives include no chest pain or shortness of breath. Current antihyperlipidemic treatment includes statins and ezetimibe. The current treatment provides moderate improvement of lipids.    Lab Results  Component Value Date   NA 137 05/15/2022   K 4.5 05/15/2022   CO2 24 05/15/2022   GLUCOSE 101 (H) 05/15/2022   BUN 13 05/15/2022   CREATININE 0.96 05/15/2022   CALCIUM 9.3 05/15/2022   EGFR 77 04/17/2022   GFRNONAA >60 05/15/2022   Lab Results  Component Value Date   CHOL 166 04/17/2022   HDL 63 04/17/2022   LDLCALC 92 04/17/2022   LDLDIRECT 116 (H) 09/24/2017   TRIG 55 04/17/2022   CHOLHDL 2.6 04/17/2022   Lab Results  Component Value Date   TSH 2.390 11/07/2021   Lab Results  Component Value Date   HGBA1C 5.7 (H) 04/17/2022   Lab Results  Component Value Date   WBC 5.7 04/17/2022   HGB 14.2 04/17/2022   HCT 42.6 04/17/2022   MCV 88 04/17/2022   PLT 276 04/17/2022   Lab Results  Component Value Date   ALT 36 04/17/2022   AST 33 04/17/2022   ALKPHOS 61 04/17/2022   BILITOT 1.0 04/17/2022   No results found for: "25OHVITD2", "25OHVITD3", "VD25OH"   Review of Systems  Constitutional:  Positive for fatigue. Negative for chills  and fever.  Respiratory:  Negative for cough, chest tightness, shortness of breath and wheezing.   Cardiovascular:  Negative for chest pain and leg swelling.  Neurological:  Positive for light-headedness. Negative for dizziness and headaches.  Psychiatric/Behavioral:  Positive for sleep disturbance. Negative for dysphoric mood. The patient has insomnia. The patient is not nervous/anxious.     Patient Active Problem List   Diagnosis Date Noted   Primary insomnia 06/18/2022   Chronic deep vein thrombosis (DVT) of brachial vein of left upper extremity ([place]) 12/28/2021   Contrast media allergy 12/12/2021   GERD (gastroesophageal reflux disease) 12/12/2021   Hemorrhoid 12/12/2021   Ischemic cardiomyopathy 12/12/2021   Neck muscle spasm 12/26/2020   Urinary hesitancy 12/26/2020   Carpal tunnel syndrome, bilateral 58/30/9407   Chronic systolic congestive heart failure ([place]) 09/29/2020   Thrombophilia ([place]) 09/29/2020   PAF (paroxysmal atrial fibrillation) ([place]) 04/11/2020   PAD (peripheral artery disease) ([place]) 03/31/2019   Rectal polyp    Impingement syndrome of left shoulder region 10/14/2018   Coronary artery disease of native artery of native heart with stable angina pectoris ([place]) 10/03/2018   Essential hypertension 06/19/2017   Mixed hyperlipidemia 06/19/2017   Overweight (BMI 25.0-29.9) 06/19/2017   BPH (benign prostatic hyperplasia) 06/19/2017   ED (erectile dysfunction) 06/19/2017   MCI (mild cognitive impairment) 06/19/2017   Lumbar spondylosis with myelopathy 11/08/2016   Hand dermatitis 07/26/2014   Hx of transient ischemic attack (TIA) 2011    Allergies  Allergen Reactions  Codeine Hives   Shellfish Allergy Hives and Swelling   Contrast Media [Iodinated Contrast Media] Itching and Other (See Comments)    Whelts on tongue   Fish Allergy Itching    Past Surgical History:  Procedure Laterality Date   ANAL FISTULECTOMY N/A 12/21/2014   Procedure: FISTULECTOMY ANAL;   Surgeon: Christene Lye, MD;  Location: [facility] ORS;  Service: General;  Laterality: N/A;   APPENDECTOMY  1975   CARDIAC CATHETERIZATION     COLONOSCOPY WITH PROPOFOL N/A 02/12/2019   Procedure: COLONOSCOPY WITH PROPOFOL;  Surgeon: Lucilla Lame, MD;  Location: [facility] ENDOSCOPY;  Service: Endoscopy;  Laterality: N/A;   CORONARY STENT INTERVENTION N/A 09/09/2017   Procedure: CORONARY STENT INTERVENTION;  Surgeon: Wellington Hampshire, MD;  Location: [place] [facility];  Service: Cardiovascular;  Laterality: N/A;   CORONARY/GRAFT ACUTE MI REVASCULARIZATION N/A 07/18/2017   Procedure: Coronary/Graft Acute MI Revascularization;  Surgeon: Wellington Hampshire, MD;  Location: [place] [facility];  Service: Cardiovascular;  Laterality: N/A;   HEMORRHOID SURGERY N/A 12/21/2014   Procedure: HEMORRHOIDECTOMY;  Surgeon: Christene Lye, MD;  Location: [facility] ORS;  Service: General;  Laterality: N/A;   INTRAVASCULAR ULTRASOUND/IVUS N/A 09/09/2017   Procedure: Intravascular Ultrasound/IVUS;  Surgeon: Wellington Hampshire, MD;  Location: [place] [facility];  Service: Cardiovascular;  Laterality: N/A;   KNEE SURGERY Right age 71   LEFT HEART CATH AND CORONARY ANGIOGRAPHY N/A 07/18/2017   Procedure: LEFT HEART CATH AND CORONARY ANGIOGRAPHY;  Surgeon: Wellington Hampshire, MD;  Location: [place] [facility];  Service: Cardiovascular;  Laterality: N/A;   LEFT HEART CATH AND CORONARY ANGIOGRAPHY Left 08/19/2017   Procedure: LEFT HEART CATH AND CORONARY ANGIOGRAPHY;  Surgeon: Wellington Hampshire, MD;  Location: [place] [facility];  Service: Cardiovascular;  Laterality: Left;   LEFT HEART CATH AND CORONARY ANGIOGRAPHY N/A 11/07/2017   Procedure: LEFT HEART CATH AND CORONARY ANGIOGRAPHY;  Surgeon: Wellington Hampshire, MD;  Location: [facility];  Service: Cardiovascular;  Laterality: N/A;   LEFT HEART CATH AND CORONARY ANGIOGRAPHY N/A 10/17/2021   Procedure: LEFT HEART CATH AND CORONARY ANGIOGRAPHY;  Surgeon: Wellington Hampshire, MD;  Location: [facility];  Service: Cardiovascular;  Laterality: N/A;   ROTATOR CUFF REPAIR Right 2013    Social History   Tobacco Use   Smoking status: Former    Types: Cigarettes    Quit date: 07/24/2007    Years since quitting: 14.9    Passive exposure: Past   Smokeless tobacco: Never   Tobacco comments:    smoking cessation materials not required  Vaping Use   Vaping Use: Never used  Substance Use Topics   Alcohol use: Yes    Alcohol/week: 5.0 - 6.0 standard drinks of alcohol    Types: 5 - 6 Cans of beer per week    Comment:  weekly   Drug use: No     Medication list has been reviewed and updated.  Current Meds  Medication Sig   amLODipine (NORVASC) 5 MG tablet Take 5 mg by mouth daily.   aspirin EC 81 MG tablet Take 81 mg by mouth daily.    carvedilol (COREG) 3.125 MG tablet TAKE 1 TABLET TWICE DAILY WITH MEALS   clopidogrel (PLAVIX) 75 MG tablet TAKE 1 TABLET EVERY DAY   clotrimazole-betamethasone (LOTRISONE) cream Apply 1 Application topically 2 (two) times daily.   gabapentin (NEURONTIN) 100 MG capsule Take 200 mg by mouth 2 (two) times daily.   nitroGLYCERIN (  NITROSTAT) 0.4 MG SL tablet Place 1 tablet (0.4 mg total) under the tongue every 5 (five) minutes as needed for chest pain.   rosuvastatin (CRESTOR) 40 MG tablet Take 1 tablet (40 mg total) by mouth daily.   sacubitril-valsartan (ENTRESTO) 24-26 MG Take 1 tablet by mouth 2 (two) times daily.   sildenafil (REVATIO) 20 MG tablet Take 1 tablet (20 mg total) by mouth 3 (three) times daily as needed.   spironolactone (ALDACTONE) 25 MG tablet Take 1 tablet (25 mg total) by mouth daily.   tamsulosin (FLOMAX) 0.4 MG CAPS capsule Take 1 capsule (0.4 mg total) by mouth daily.   temazepam (RESTORIL) 15 MG capsule Take 1 capsule (15 mg total) by mouth at bedtime as needed for sleep.   traMADol (ULTRAM) 50 MG tablet Take 50 mg by mouth every 6 (six) hours as needed (For 30 doses.).       06/18/2022     2:44 PM 04/17/2022    8:50 AM 12/28/2021    9:21 AM 11/07/2021    8:46 AM  GAD 7 : Generalized Anxiety Score  Nervous, Anxious, on Edge 1 1 0 0  Control/stop worrying 0 1 0 0  Worry too much - different things 0 1 0 0  Trouble relaxing _0 Restless _1 Easily annoyed or irritable 1 1 0 1  Afraid - awful might happen 0 0 0 0  Total GAD 7 Score _2 Anxiety Difficulty Not difficult at all Not difficult at all Not difficult at all Not difficult at all       06/18/2022    2:44 PM 04/17/2022    8:50 AM 12/28/2021    9:21 AM  Depression screen PHQ 2/9  Decreased Interest 1 1 0  Down, Depressed, Hopeless 1 0 0  PHQ - 2 Score 2 1 0  Altered sleeping _3 Tired, decreased energy _4 Change in appetite 1 0 0  Feeling bad or failure about yourself  0 0 0  Trouble concentrating 1 2 0  Moving slowly or fidgety/restless 0 1 0  Suicidal thoughts 0 0 0  PHQ-9 Score _5 Difficult doing work/chores Not difficult at all Not difficult at all Not difficult at all    BP Readings from Last 3 Encounters:  06/18/22 114/78  05/18/22 120/72  04/17/22 126/78    Physical Exam Vitals and nursing note reviewed.  Constitutional:      General: He is not in acute distress.    Appearance: Normal appearance. He is well-developed.  HENT:     Head: Normocephalic and atraumatic.  Cardiovascular:     Rate and Rhythm: Normal rate and regular rhythm.  Pulmonary:     Effort: Pulmonary effort is normal. No respiratory distress.     Breath sounds: No wheezing or rhonchi.  Musculoskeletal:     Cervical back: Normal range of motion.     Right lower leg: No edema.     Left lower leg: No edema.  Lymphadenopathy:     Cervical: No cervical adenopathy.  Skin:    General: Skin is warm and dry.     Findings: No rash.  Neurological:     Mental Status: He is alert and oriented to person, place, and time.  Psychiatric:        Mood and Affect: Mood normal.        Behavior: Behavior  normal.  Wt Readings from Last 3 Encounters:  06/18/22 177 lb 12.8 oz (80.6 kg)  05/18/22 179 lb (81.2 kg)  04/17/22 179 lb (81.2 kg)    BP 114/78 (BP Location: Right Arm, Cuff Size: Normal)   Pulse 92   Ht _0  (1.702 m)   Wt 177 lb 12.8 oz (80.6 kg)   SpO2 95%   BMI 27.85 kg/m   Assessment and Plan: 1. Primary insomnia Doing well on PRN temazepam. - temazepam (RESTORIL) 15 MG capsule; Take 1 capsule (15 mg total) by mouth at bedtime as needed for sleep.  Dispense: 30 capsule; Refill: 3  2. Essential hypertension Clinically stable exam with well controlled BP. Tolerating medications without side effects at this time. Pt to continue current regimen and low sodium diet; benefits of regular exercise as able discussed.  3. Coronary artery disease of native artery of native heart with stable angina pectoris ([place]) Followed by Cardiology Reminded to get labs done since increasing spironolactone - nitroGLYCERIN (NITROSTAT) 0.4 MG SL tablet; Place 1 tablet (0.4 mg total) under the tongue every 5 (five) minutes as needed for chest pain.  Dispense: 30 tablet; Refill: 0 - clopidogrel (PLAVIX) 75 MG tablet; Take 1 tablet (75 mg total) by mouth daily.  Dispense: 90 tablet; Refill: 3  4. Hyperlipidemia, unspecified hyperlipidemia type On max dose Crestor; may be a candidate for Repatha - ezetimibe (ZETIA) 10 MG tablet; Take 1 tablet (10 mg total) by mouth daily.  Dispense: 90 tablet; Refill: 3   Partially dictated using Editor, commissioning. Any errors are unintentional.  Halina Maidens, MD [facility]  06/18/2022

## 2022-06-20 DIAGNOSIS — M6281 Muscle weakness (generalized): Secondary | ICD-10-CM | POA: Diagnosis not present

## 2022-06-20 DIAGNOSIS — M5412 Radiculopathy, cervical region: Secondary | ICD-10-CM | POA: Diagnosis not present

## 2022-06-26 DIAGNOSIS — R200 Anesthesia of skin: Secondary | ICD-10-CM | POA: Diagnosis not present

## 2022-06-27 DIAGNOSIS — M6281 Muscle weakness (generalized): Secondary | ICD-10-CM | POA: Diagnosis not present

## 2022-06-27 DIAGNOSIS — M5412 Radiculopathy, cervical region: Secondary | ICD-10-CM | POA: Diagnosis not present

## 2022-06-28 ENCOUNTER — Other Ambulatory Visit: Payer: Self-pay

## 2022-06-28 MED ORDER — ENTRESTO 24-26 MG PO TABS: 1.0000 | ORAL_TABLET | Freq: Two times a day (BID) | ORAL | 11 refills | Status: DC

## 2022-07-04 DIAGNOSIS — M5412 Radiculopathy, cervical region: Secondary | ICD-10-CM | POA: Diagnosis not present

## 2022-07-04 DIAGNOSIS — M6281 Muscle weakness (generalized): Secondary | ICD-10-CM | POA: Diagnosis not present

## 2022-07-05 DIAGNOSIS — M79602 Pain in left arm: Secondary | ICD-10-CM | POA: Diagnosis not present

## 2022-07-05 DIAGNOSIS — G5603 Carpal tunnel syndrome, bilateral upper limbs: Secondary | ICD-10-CM | POA: Diagnosis not present

## 2022-07-05 DIAGNOSIS — R29898 Other symptoms and signs involving the musculoskeletal system: Secondary | ICD-10-CM | POA: Diagnosis not present

## 2022-07-05 DIAGNOSIS — R200 Anesthesia of skin: Secondary | ICD-10-CM | POA: Diagnosis not present

## 2022-07-05 DIAGNOSIS — M5412 Radiculopathy, cervical region: Secondary | ICD-10-CM | POA: Diagnosis not present

## 2022-07-05 DIAGNOSIS — G479 Sleep disorder, unspecified: Secondary | ICD-10-CM | POA: Diagnosis not present

## 2022-07-11 ENCOUNTER — Other Ambulatory Visit: Payer: Self-pay | Admitting: Internal Medicine

## 2022-07-11 ENCOUNTER — Other Ambulatory Visit: Payer: Self-pay | Admitting: Physician Assistant

## 2022-07-11 DIAGNOSIS — M5412 Radiculopathy, cervical region: Secondary | ICD-10-CM

## 2022-07-11 DIAGNOSIS — M79602 Pain in left arm: Secondary | ICD-10-CM

## 2022-07-11 DIAGNOSIS — R29898 Other symptoms and signs involving the musculoskeletal system: Secondary | ICD-10-CM

## 2022-07-11 DIAGNOSIS — R200 Anesthesia of skin: Secondary | ICD-10-CM

## 2022-07-12 ENCOUNTER — Emergency Department: Payer: Medicare HMO

## 2022-07-12 ENCOUNTER — Emergency Department
Admission: EM | Admit: 2022-07-12 | Discharge: 2022-07-13 | Disposition: A | Discharge status: Home / Self Care | Payer: Medicare HMO | Attending: Emergency Medicine | Admitting: Emergency Medicine

## 2022-07-12 ENCOUNTER — Encounter: Payer: Self-pay | Admitting: Emergency Medicine

## 2022-07-12 ENCOUNTER — Other Ambulatory Visit: Payer: Self-pay

## 2022-07-12 DIAGNOSIS — Z7982 Long term (current) use of aspirin: Secondary | ICD-10-CM | POA: Insufficient documentation

## 2022-07-12 DIAGNOSIS — Z955 Presence of coronary angioplasty implant and graft: Secondary | ICD-10-CM | POA: Diagnosis not present

## 2022-07-12 DIAGNOSIS — I509 Heart failure, unspecified: Secondary | ICD-10-CM | POA: Insufficient documentation

## 2022-07-12 DIAGNOSIS — Z7902 Long term (current) use of antithrombotics/antiplatelets: Secondary | ICD-10-CM | POA: Insufficient documentation

## 2022-07-12 DIAGNOSIS — R0789 Other chest pain: Secondary | ICD-10-CM | POA: Diagnosis not present

## 2022-07-12 DIAGNOSIS — I2510 Atherosclerotic heart disease of native coronary artery without angina pectoris: Secondary | ICD-10-CM | POA: Diagnosis not present

## 2022-07-12 DIAGNOSIS — R079 Chest pain, unspecified: Secondary | ICD-10-CM | POA: Diagnosis not present

## 2022-07-12 LAB — TROPONIN I (HIGH SENSITIVITY)
Troponin I (High Sensitivity): 4 ng/L (ref <18)
Troponin I (High Sensitivity): 5 ng/L (ref <18)

## 2022-07-12 LAB — CBC
HCT: 41.8 % (ref 39.0–52.0)
Hemoglobin: 14.1 g/dL (ref 13.0–17.0)
MCH: 28.3 pg (ref 26.0–34.0)
MCHC: 33.7 g/dL (ref 30.0–36.0)
MCV: 83.9 fL (ref 80.0–100.0)
Platelets: 292 10*3/uL (ref 150–400)
RBC: 4.98 MIL/uL (ref 4.22–5.81)
RDW: 13.6 % (ref 11.5–15.5)
WBC: 8.2 10*3/uL (ref 4.0–10.5)
nRBC: 0 % (ref 0.0–0.2)

## 2022-07-12 LAB — BASIC METABOLIC PANEL
Anion gap: 8 (ref 5–15)
BUN: 18 mg/dL (ref 8–23)
CO2: 24 mmol/L (ref 22–32)
Calcium: 9.8 mg/dL (ref 8.9–10.3)
Chloride: 105 mmol/L (ref 98–111)
Creatinine, Ser: 1.14 mg/dL (ref 0.61–1.24)
GFR, Estimated: >60 mL/min (ref >60)
Glucose, Bld: 124 mg/dL — ABNORMAL HIGH (ref 70–99)
Potassium: 4.8 mmol/L (ref 3.5–5.1)
Sodium: 137 mmol/L (ref 135–145)

## 2022-07-12 LAB — BRAIN NATRIURETIC PEPTIDE: B Natriuretic Peptide: 22.6 pg/mL (ref 0.0–100.0)

## 2022-07-12 NOTE — ED Triage Notes (Signed)
Patient ambulatory to triage with steady gait, without difficulty or distress noted; pt reports mid CP, nonradiating accomp by [facility]; 1 NTG and 4 ASA taken PTA

## 2022-07-12 NOTE — ED Provider Notes (Signed)
[facility] Provider Note    Event Date/Time   First MD Initiated Contact with Patient 07/12/22 2336     (approximate)   History   Chest Pain   HPI  Todd Weaver is a 71 y.o. male who presents to the ED for evaluation of Chest Pain   I reviewed cardiology clinic visit from 10/27.  History of CAD s/p STEMI and V-fib arrest with PCI 2018, combined CHF, CVA and PAD.  Last LHC seems to be in March 2023 with widely patent stents, chronic 70% stenosis of ostial ramus.  DAPT with aspirin and Plavix without AC.  Chronic left brachial thrombus with reassuring hypercoagulable workup as an outpatient.  Patient presents to the ED for evaluation of an episode of chest pain that is since resolved.  He reports being seated on the couch and had about 1 hour of substernal sharp chest pressure without any associated symptoms.  Reports his symptoms resolved by the time he was on the way to the ED.  He did take aspirin or nitro prior to arrival.  Prior to this, he denies any exertional symptoms.  He is quite active and reports he has been working on his shed or garage and has had no chest pain, dyspnea or decreased exercise tolerance while doing this manual labor.  No recent illnesses.   Physical Exam   Triage Vital Signs: ED Triage Vitals  Enc Vitals Group     BP 07/12/22 2225 134/70     Pulse Rate 07/12/22 2225 (!) 56     Resp 07/12/22 2225 17     Temp 07/12/22 2225 97.7 F (36.5 C)     Temp Source 07/12/22 2225 Oral     SpO2 07/12/22 2225 98 %     Weight 07/12/22 2012 187 lb (84.8 kg)     Height 07/12/22 2012 '5\' 7"'$  (1.702 m)     Head Circumference --      Peak Flow --      Pain Score 07/12/22 2012 8     Pain Loc --      Pain Edu? --      Excl. in [place]? --     Most recent vital signs: Vitals:   07/12/22 2225 07/13/22 0003  BP: 134/70 111/71  Pulse: (!) 56 (!) 58  Resp: 17 15  Temp: 97.7 F (36.5 C)   SpO2: 98% 94%    General: Awake, no distress.   CV:  Good peripheral perfusion. RRR Resp:  Normal effort.  CTAB Abd:  No distention.  MSK:  No deformity noted.  Neuro:  No focal deficits appreciated. Other:     ED Results / Procedures / Treatments   Labs (all labs ordered are listed, but only abnormal results are displayed) Labs Reviewed  BASIC METABOLIC PANEL - Abnormal; Notable for the following components:      Result Value   Glucose, Bld 124 (*)    All other components within normal limits  CBC  BRAIN NATRIURETIC PEPTIDE  TROPONIN I (HIGH SENSITIVITY)  TROPONIN I (HIGH SENSITIVITY)    EKG Sinus rhythm with a rate of 56 bpm.  Normal axis and intervals.  Nonspecific ST changes laterally without STEMI.  RADIOLOGY CXR interpreted by me without evidence of acute cardiopulmonary pathology.  Official radiology report(s): DG Chest 2 View  Result Date: 07/12/2022 CLINICAL DATA:  Chest pain EXAM: CHEST - 2 VIEW COMPARISON:  Chest x-ray 12/05/2020 FINDINGS: The heart size and mediastinal contours are within  normal limits. Both lungs are clear. The visualized skeletal structures are unremarkable. IMPRESSION: No active cardiopulmonary disease. Electronically Signed   By: Ronney Asters M.D.   On: 07/12/2022 20:39    PROCEDURES and INTERVENTIONS:  .1-3 Lead EKG Interpretation  Performed by: Vladimir Crofts, MD Authorized by: Vladimir Crofts, MD     Interpretation: normal     ECG rate:  61   ECG rate assessment: normal     Rhythm: sinus rhythm     Ectopy: none     Conduction: normal     Medications - No data to display   IMPRESSION / MDM / [place] / ED COURSE  I reviewed the triage vital signs and the nursing notes.  Differential diagnosis includes, but is not limited to, ACS, PTX, PNA, muscle strain/spasm, PE, dissection  {Patient presents with symptoms of an acute illness or injury that is potentially life-threatening.  71 year old male with cardiac history presents with resolved episode of atypical chest  pain, ultimately suitable to trial of close outpatient management.  He overall looks well and has no chest pain throughout his stay in the ED.  His EKG is nonischemic and he has 2 negative troponins.  Normal BNP, CBC and metabolic panel.  Clear CXR.  He looks well.  We discussed possibly observation admission considering his risk factors, but he is adamant that he will follow-up with his cardiologist and return to the ED if his symptoms worsen, which I think is reasonable.  We reviewed return precautions     FINAL CLINICAL IMPRESSION(S) / ED DIAGNOSES   Final diagnoses:  Other chest pain     Rx / DC Orders   ED Discharge Orders     None        Note:  This document was prepared using Dragon voice recognition software and may include unintentional dictation errors.   Vladimir Crofts, MD 07/13/22 [phone]

## 2022-07-19 DIAGNOSIS — M5412 Radiculopathy, cervical region: Secondary | ICD-10-CM | POA: Diagnosis not present

## 2022-07-19 DIAGNOSIS — G5603 Carpal tunnel syndrome, bilateral upper limbs: Secondary | ICD-10-CM | POA: Diagnosis not present

## 2022-07-19 DIAGNOSIS — I82729 Chronic embolism and thrombosis of deep veins of unspecified upper extremity: Secondary | ICD-10-CM | POA: Diagnosis not present

## 2022-07-20 ENCOUNTER — Ambulatory Visit
Admission: RE | Admit: 2022-07-20 | Discharge: 2022-07-20 | Disposition: A | Discharge status: Home / Self Care | Payer: Medicare HMO | Source: Ambulatory Visit | Attending: Physician Assistant | Admitting: Physician Assistant

## 2022-07-20 DIAGNOSIS — M79602 Pain in left arm: Secondary | ICD-10-CM | POA: Insufficient documentation

## 2022-07-20 DIAGNOSIS — R29898 Other symptoms and signs involving the musculoskeletal system: Secondary | ICD-10-CM | POA: Diagnosis not present

## 2022-07-20 DIAGNOSIS — R200 Anesthesia of skin: Secondary | ICD-10-CM | POA: Diagnosis not present

## 2022-07-20 DIAGNOSIS — M5412 Radiculopathy, cervical region: Secondary | ICD-10-CM | POA: Insufficient documentation

## 2022-07-26 ENCOUNTER — Other Ambulatory Visit: Payer: Self-pay | Admitting: Internal Medicine

## 2022-08-06 NOTE — Progress Notes (Unsigned)
Referring Physician:  Wonda Cerise, PA-C [place],  [place] [address]  Primary Physician:  Glean Hess, MD  History of Present Illness: 08/07/2022 Todd Weaver is here today with a chief complaint of occasional left arm pain.  He has noticed weakness in his hands particular with opening jars and doing other things with his hands.  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  He has been dropping items and has numbness in his left hand.  His right hand started having numbness as well.  He has not had any falls.  Laying down and heavy lifting do make his symptoms worse.  Bowel/Bladder Dysfunction: none  Conservative measures:  Physical therapy:  has participated in at [facility] from 06/20/22 to 07/04/22 Multimodal medical therapy including regular antiinflammatories:  gabapentin, tizanidine, tramadol Injections:  has not received any steroid injections  Past Surgery: denies  Todd Weaver has symptoms of cervical myelopathy.  The symptoms are causing a significant impact on the patient's life.   I have utilized the care everywhere function in epic to review the outside records available from external health systems.  Review of Systems:  A 10 point review of systems is negative, except for the pertinent positives and negatives detailed in the HPI.  Past Medical History: Past Medical History:  Diagnosis Date   Acute ST elevation myocardial infarction (STEMI) of inferior wall ([place]) 07/18/2017   BPH (benign prostatic hyperplasia) 06/19/2017   Carpal tunnel syndrome of right wrist 12/26/2020   Chest pain 0/99/8338   Chronic systolic congestive heart failure ([place]) 09/29/2020   Contrast media allergy    Coronary artery disease of native artery of native heart with stable angina pectoris ([place]) 10/03/2018   cardiac catheterization April 2019 showed widely patent LAD and RCA stent with no  significant restenosis.  The ramus branch was pinched by the LAD stent with 70% ostial stenosis.  EF was 50 to 55%  12/2020: Myoview: T wave inversion was noted during stress in the V6, V5, II, III and aVF  leads.   There was no ST segment deviation noted during stress.   The study is normal.   This is a low risk s   ED (erectile dysfunction) 06/19/2017   Essential hypertension 06/19/2017   GERD (gastroesophageal reflux disease)    Hand dermatitis 07/26/2014   Hemorrhoid    History of CVA (cerebrovascular accident) 06/19/2017   History of ST elevation myocardial infarction (STEMI) 07/18/2017   07/18/2017 s/p PCI/DES x 2 to the RCA with residual disease involving the distal left main/ostial LAD, proximal LAD, mid LAD, and LCx      Hyperlipidemia    Hypertension    Impingement syndrome of left shoulder region 10/14/2018   Ischemic cardiomyopathy    a. 06/2017 Echo: EF 45-50%, mod inf/infsept HK. Mildly dil LA; b. 09/2019 Echo: EF 45-50%, Gr2 DD. Mild MR.   Lumbar spondylosis with myelopathy 11/08/2016   MCI (mild cognitive impairment) 06/19/2017   Mixed hyperlipidemia 06/19/2017       Neck muscle spasm 12/26/2020   Overweight (BMI 25.0-29.9) 06/19/2017   PAD (peripheral artery disease) ([place])    a. 01/2019 ABIs and Duplex: ABI R 0.78, L 0.79; TBI R 0.66, L 1.11. RCIA >50p, RSFA 50-74. LCIA >50p, LSFA 75-99p, 30-49d. 3 vessel runoff bilat.   PAF (paroxysmal atrial fibrillation) ([place])    a. Brief episode during December hospitalization-->converted on  IV amio-->not on [place].   Rectal polyp    Stroke [facility]) 2011   "2 MINI STROKES". no deficits.   Thrombophilia ([place]) 09/29/2020   Urinary hesitancy 12/26/2020    Past Surgical History: Past Surgical History:  Procedure Laterality Date   ANAL FISTULECTOMY N/A 12/21/2014   Procedure: FISTULECTOMY ANAL;  Surgeon: Christene Lye, MD;  Location: [facility] ORS;  Service: General;  Laterality: N/A;   APPENDECTOMY  1975   CARDIAC CATHETERIZATION      COLONOSCOPY WITH PROPOFOL N/A 02/12/2019   Procedure: COLONOSCOPY WITH PROPOFOL;  Surgeon: Lucilla Lame, MD;  Location: [facility] ENDOSCOPY;  Service: Endoscopy;  Laterality: N/A;   CORONARY STENT INTERVENTION N/A 09/09/2017   Procedure: CORONARY STENT INTERVENTION;  Surgeon: Wellington Hampshire, MD;  Location: [place] CV LAB;  Service: Cardiovascular;  Laterality: N/A;   CORONARY/GRAFT ACUTE MI REVASCULARIZATION N/A 07/18/2017   Procedure: Coronary/Graft Acute MI Revascularization;  Surgeon: Wellington Hampshire, MD;  Location: [place] CV LAB;  Service: Cardiovascular;  Laterality: N/A;   HEMORRHOID SURGERY N/A 12/21/2014   Procedure: HEMORRHOIDECTOMY;  Surgeon: Christene Lye, MD;  Location: [facility] ORS;  Service: General;  Laterality: N/A;   INTRAVASCULAR ULTRASOUND/IVUS N/A 09/09/2017   Procedure: Intravascular Ultrasound/IVUS;  Surgeon: Wellington Hampshire, MD;  Location: [place] CV LAB;  Service: Cardiovascular;  Laterality: N/A;   KNEE SURGERY Right age 59   LEFT HEART CATH AND CORONARY ANGIOGRAPHY N/A 07/18/2017   Procedure: LEFT HEART CATH AND CORONARY ANGIOGRAPHY;  Surgeon: Wellington Hampshire, MD;  Location: [place] CV LAB;  Service: Cardiovascular;  Laterality: N/A;   LEFT HEART CATH AND CORONARY ANGIOGRAPHY Left 08/19/2017   Procedure: LEFT HEART CATH AND CORONARY ANGIOGRAPHY;  Surgeon: Wellington Hampshire, MD;  Location: [place] CV LAB;  Service: Cardiovascular;  Laterality: Left;   LEFT HEART CATH AND CORONARY ANGIOGRAPHY N/A 11/07/2017   Procedure: LEFT HEART CATH AND CORONARY ANGIOGRAPHY;  Surgeon: Wellington Hampshire, MD;  Location: [place] CV LAB;  Service: Cardiovascular;  Laterality: N/A;   LEFT HEART CATH AND CORONARY ANGIOGRAPHY N/A 10/17/2021   Procedure: LEFT HEART CATH AND CORONARY ANGIOGRAPHY;  Surgeon: Wellington Hampshire, MD;  Location: [place] CV LAB;  Service: Cardiovascular;  Laterality: N/A;   ROTATOR CUFF REPAIR Right 2013     Allergies: Allergies as of 08/07/2022 - Review Complete 08/07/2022  Allergen Reaction Noted   Codeine Hives and Other (See Comments) 04/06/2019   Shellfish allergy Hives and Swelling 11/08/2014   Contrast media [iodinated contrast media] Itching and Other (See Comments) 08/22/2017   Fish allergy Itching 02/05/2019    Medications: Current Meds  Medication Sig   furosemide (LASIX) 20 MG tablet Take 20 mg by mouth daily.   oxybutynin (DITROPAN-XL) 10 MG 24 hr tablet Take 10 mg by mouth daily.    Social History: Social History   Tobacco Use   Smoking status: Former    Types: Cigarettes    Quit date: 07/24/2007    Years since quitting: 15.0    Passive exposure: Past   Smokeless tobacco: Never   Tobacco comments:    smoking cessation materials not required  Vaping Use   Vaping Use: Never used  Substance Use Topics   Alcohol use: Yes    Alcohol/week: 5.0 - 6.0 standard drinks of alcohol    Types: 5 - 6 Cans of beer per week    Comment:  weekly   Drug use: No    Family Medical History: Family History  Problem Relation Age of Onset   Hypertension Mother    Alzheimer's disease Mother    Hypertension Father    Alzheimer's disease Father     Physical Examination: Vitals:   08/07/22 0909  BP: 110/68  Pulse: 60    General: Patient is well developed, well nourished, calm, collected, and in no apparent distress. Attention to examination is appropriate.  Neck:   Supple.  Full range of motion.  Respiratory: Patient is breathing without any difficulty.   NEUROLOGICAL:     Awake, alert, oriented to person, place, and time.  Speech is clear and fluent.   Cranial Nerves: Pupils equal round and reactive to light.  Facial tone is symmetric.  Facial sensation is symmetric. Shoulder shrug is symmetric. Tongue protrusion is midline.  There is no pronator drift.  ROM of spine: full.    Strength: Side Biceps Triceps Deltoid Interossei Grip Wrist Ext. Wrist Flex.  R '5 5 5  '$ 4+ 4+ 5 5  L '5 5 5 4 4 5 5   '$ Side Iliopsoas Quads Hamstring PF DF EHL  R '5 5 5 5 5 5  '$ L '5 5 5 5 5 5   '$ Reflexes are 2+ and symmetric at the biceps, triceps, brachioradialis, patella and achilles.   Hoffman's is present on hte left.   Bilateral upper and lower extremity sensation is intact to light touch other than in his hands and forearms, which is diminished.    No evidence of dysmetria noted.  Gait is wide-based.  He has moderate difficulty with tandem gait and is unable to complete 10 feet of tandem gait.     Medical Decision Making  Imaging: MRI C spine 07/20/2022  IMPRESSION: 1. Degenerative disc disease at C5-6 resulting in severe canal stenosis with cord compression. Increased signal within the cord at this level is likely reflective of chronic myelomalacia, although cord edema not excluded. There is also severe bilateral foraminal stenosis at this level. 2. Moderate canal stenosis at C4-5 with mild bilateral foraminal stenosis. 3. Mild canal stenosis at C3-4 and C6-7.   These results will be called to the ordering clinician or representative by the Radiologist Assistant, and communication documented in the PACS or [employer].     Electronically Signed   By: Davina Poke D.O.   On: 07/24/2022 10:28  I have personally reviewed the images and agree with the above interpretation.  Assessment and Plan: Todd Weaver is a pleasant 72 y.o. male with cervical myelopathy due to cervical stenosis from C4-C6 with myelomalacia at C5-6.  He has clinical signs of cervical myelopathy on my examination and has worsening symptoms over time.  Due to his severe stenosis C5-6 with myelomalacia, I do not feel that further conservative management is indicated.  We discussed that conservative management is not indicated for cervical myelopathy.  Given his subjective weakness and abnormalities on physical examination, I recommended surgical intervention with a C4-6 anterior  cervical discectomy and fusion.  I do not think a posterior approach is appropriate given the amount of disc herniation he is suffering from at C4-5 and C5-6.  I discussed the planned procedure at length with the patient, including the risks, benefits, alternatives, and indications. The risks discussed include but are not limited to bleeding, infection, need for reoperation, spinal fluid leak, stroke, vision loss, anesthetic complication, coma, paralysis, and even death. We also discussed the possibility of post-operative dysphagia, vocal cord paralysis, and the risk of adjacent segment disease in the future. I also  described in detail that improvement was not guaranteed.  The patient expressed understanding of these risks, and asked that we proceed with surgery. I described the surgery in layman's terms, and gave ample opportunity for questions, which were answered to the best of my ability.  I spent a total of 30 minutes in this patient's care today. This time was spent reviewing pertinent records including imaging studies, obtaining and confirming history, performing a directed evaluation, formulating and discussing my recommendations, and documenting the visit within the medical record.   Given his history of heart attack, we will send him for clearance with Dr. Fletcher Anon.  I recommended that he discontinue Plavix 7 days before surgery and restart it 14 days afterwards.   Thank you for involving me in the care of this patient.      Athaliah Baumbach K. Izora Ribas MD, [facility] Neurosurgery

## 2022-08-06 NOTE — H&P (View-Only) (Signed)
Referring Physician:  Wonda Cerise, PA-C [place],  [place] [address]  Primary Physician:  Glean Hess, MD  History of Present Illness: 08/07/2022 [name] is here today with a chief complaint of occasional left arm pain.  He has noticed weakness in his hands particular with opening jars and doing other things with his hands.  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  He has been dropping items and has numbness in his left hand.  His right hand started having numbness as well.  He has not had any falls.  Laying down and heavy lifting do make his symptoms worse.  Bowel/Bladder Dysfunction: none  Conservative measures:  Physical therapy:  has participated in at [facility] from 06/20/22 to 07/04/22 Multimodal medical therapy including regular antiinflammatories:  gabapentin, tizanidine, tramadol Injections:  has not received any steroid injections  Past Surgery: denies  Todd Weaver has symptoms of cervical myelopathy.  The symptoms are causing a significant impact on the patient's life.   I have utilized the care everywhere function in epic to review the outside records available from external health systems.  Review of Systems:  A 10 point review of systems is negative, except for the pertinent positives and negatives detailed in the HPI.  Past Medical History: Past Medical History:  Diagnosis Date   Acute ST elevation myocardial infarction (STEMI) of inferior wall ([place]) 07/18/2017   BPH (benign prostatic hyperplasia) 06/19/2017   Carpal tunnel syndrome of right wrist 12/26/2020   Chest pain 1/88/4166   Chronic systolic congestive heart failure ([place]) 09/29/2020   Contrast media allergy    Coronary artery disease of native artery of native heart with stable angina pectoris ([place]) 10/03/2018   cardiac catheterization April 2019 showed widely patent LAD and RCA stent with no  significant restenosis.  The ramus branch was pinched by the LAD stent with 70% ostial stenosis.  EF was 50 to 55%  12/2020: Myoview: T wave inversion was noted during stress in the V6, V5, II, III and aVF  leads.   There was no ST segment deviation noted during stress.   The study is normal.   This is a low risk s   ED (erectile dysfunction) 06/19/2017   Essential hypertension 06/19/2017   GERD (gastroesophageal reflux disease)    Hand dermatitis 07/26/2014   Hemorrhoid    History of CVA (cerebrovascular accident) 06/19/2017   History of ST elevation myocardial infarction (STEMI) 07/18/2017   07/18/2017 s/p PCI/DES x 2 to the RCA with residual disease involving the distal left main/ostial LAD, proximal LAD, mid LAD, and LCx      Hyperlipidemia    Hypertension    Impingement syndrome of left shoulder region 10/14/2018   Ischemic cardiomyopathy    a. 06/2017 Echo: EF 45-50%, mod inf/infsept HK. Mildly dil LA; b. 09/2019 Echo: EF 45-50%, Gr2 DD. Mild MR.   Lumbar spondylosis with myelopathy 11/08/2016   MCI (mild cognitive impairment) 06/19/2017   Mixed hyperlipidemia 06/19/2017       Neck muscle spasm 12/26/2020   Overweight (BMI 25.0-29.9) 06/19/2017   PAD (peripheral artery disease) ([place])    a. 01/2019 ABIs and Duplex: ABI R 0.78, L 0.79; TBI R 0.66, L 1.11. RCIA >50p, RSFA 50-74. LCIA >50p, LSFA 75-99p, 30-49d. 3 vessel runoff bilat.   PAF (paroxysmal atrial fibrillation) ([place])    a. Brief episode during December hospitalization-->converted on  IV amio-->not on [place].   Rectal polyp    Stroke [facility]) 2011   "2 MINI STROKES". no deficits.   Thrombophilia ([place]) 09/29/2020   Urinary hesitancy 12/26/2020    Past Surgical History: Past Surgical History:  Procedure Laterality Date   ANAL FISTULECTOMY N/A 12/21/2014   Procedure: FISTULECTOMY ANAL;  Surgeon: Christene Lye, MD;  Location: [facility] ORS;  Service: General;  Laterality: N/A;   APPENDECTOMY  1975   CARDIAC CATHETERIZATION      COLONOSCOPY WITH PROPOFOL N/A 02/12/2019   Procedure: COLONOSCOPY WITH PROPOFOL;  Surgeon: Lucilla Lame, MD;  Location: [facility] ENDOSCOPY;  Service: Endoscopy;  Laterality: N/A;   CORONARY STENT INTERVENTION N/A 09/09/2017   Procedure: CORONARY STENT INTERVENTION;  Surgeon: Wellington Hampshire, MD;  Location: [place] CV LAB;  Service: Cardiovascular;  Laterality: N/A;   CORONARY/GRAFT ACUTE MI REVASCULARIZATION N/A 07/18/2017   Procedure: Coronary/Graft Acute MI Revascularization;  Surgeon: Wellington Hampshire, MD;  Location: [place] CV LAB;  Service: Cardiovascular;  Laterality: N/A;   HEMORRHOID SURGERY N/A 12/21/2014   Procedure: HEMORRHOIDECTOMY;  Surgeon: Christene Lye, MD;  Location: [facility] ORS;  Service: General;  Laterality: N/A;   INTRAVASCULAR ULTRASOUND/IVUS N/A 09/09/2017   Procedure: Intravascular Ultrasound/IVUS;  Surgeon: Wellington Hampshire, MD;  Location: [place] CV LAB;  Service: Cardiovascular;  Laterality: N/A;   KNEE SURGERY Right age 63   LEFT HEART CATH AND CORONARY ANGIOGRAPHY N/A 07/18/2017   Procedure: LEFT HEART CATH AND CORONARY ANGIOGRAPHY;  Surgeon: Wellington Hampshire, MD;  Location: [place] CV LAB;  Service: Cardiovascular;  Laterality: N/A;   LEFT HEART CATH AND CORONARY ANGIOGRAPHY Left 08/19/2017   Procedure: LEFT HEART CATH AND CORONARY ANGIOGRAPHY;  Surgeon: Wellington Hampshire, MD;  Location: [place] CV LAB;  Service: Cardiovascular;  Laterality: Left;   LEFT HEART CATH AND CORONARY ANGIOGRAPHY N/A 11/07/2017   Procedure: LEFT HEART CATH AND CORONARY ANGIOGRAPHY;  Surgeon: Wellington Hampshire, MD;  Location: [place] CV LAB;  Service: Cardiovascular;  Laterality: N/A;   LEFT HEART CATH AND CORONARY ANGIOGRAPHY N/A 10/17/2021   Procedure: LEFT HEART CATH AND CORONARY ANGIOGRAPHY;  Surgeon: Wellington Hampshire, MD;  Location: [place] CV LAB;  Service: Cardiovascular;  Laterality: N/A;   ROTATOR CUFF REPAIR Right 2013     Allergies: Allergies as of 08/07/2022 - Review Complete 08/07/2022  Allergen Reaction Noted   Codeine Hives and Other (See Comments) 04/06/2019   Shellfish allergy Hives and Swelling 11/08/2014   Contrast media [iodinated contrast media] Itching and Other (See Comments) 08/22/2017   Fish allergy Itching 02/05/2019    Medications: Current Meds  Medication Sig   furosemide (LASIX) 20 MG tablet Take 20 mg by mouth daily.   oxybutynin (DITROPAN-XL) 10 MG 24 hr tablet Take 10 mg by mouth daily.    Social History: Social History   Tobacco Use   Smoking status: Former    Types: Cigarettes    Quit date: 07/24/2007    Years since quitting: 15.0    Passive exposure: Past   Smokeless tobacco: Never   Tobacco comments:    smoking cessation materials not required  Vaping Use   Vaping Use: Never used  Substance Use Topics   Alcohol use: Yes    Alcohol/week: 5.0 - 6.0 standard drinks of alcohol    Types: 5 - 6 Cans of beer per week    Comment:  weekly   Drug use: No    Family Medical History: Family History  Problem Relation Age of Onset   Hypertension Mother    Alzheimer's disease Mother    Hypertension Father    Alzheimer's disease Father     Physical Examination: Vitals:   08/07/22 0909  BP: 110/68  Pulse: 60    General: Patient is well developed, well nourished, calm, collected, and in no apparent distress. Attention to examination is appropriate.  Neck:   Supple.  Full range of motion.  Respiratory: Patient is breathing without any difficulty.   NEUROLOGICAL:     Awake, alert, oriented to person, place, and time.  Speech is clear and fluent.   Cranial Nerves: Pupils equal round and reactive to light.  Facial tone is symmetric.  Facial sensation is symmetric. Shoulder shrug is symmetric. Tongue protrusion is midline.  There is no pronator drift.  ROM of spine: full.    Strength: Side Biceps Triceps Deltoid Interossei Grip Wrist Ext. Wrist Flex.  R '5 5 5  '$ 4+ 4+ 5 5  L '5 5 5 4 4 5 5   '$ Side Iliopsoas Quads Hamstring PF DF EHL  R '5 5 5 5 5 5  '$ L '5 5 5 5 5 5   '$ Reflexes are 2+ and symmetric at the biceps, triceps, brachioradialis, patella and achilles.   Hoffman's is present on hte left.   Bilateral upper and lower extremity sensation is intact to light touch other than in his hands and forearms, which is diminished.    No evidence of dysmetria noted.  Gait is wide-based.  He has moderate difficulty with tandem gait and is unable to complete 10 feet of tandem gait.     Medical Decision Making  Imaging: MRI C spine 07/20/2022  IMPRESSION: 1. Degenerative disc disease at C5-6 resulting in severe canal stenosis with cord compression. Increased signal within the cord at this level is likely reflective of chronic myelomalacia, although cord edema not excluded. There is also severe bilateral foraminal stenosis at this level. 2. Moderate canal stenosis at C4-5 with mild bilateral foraminal stenosis. 3. Mild canal stenosis at C3-4 and C6-7.   These results will be called to the ordering clinician or representative by the Radiologist Assistant, and communication documented in the PACS or [employer].     Electronically Signed   By: Davina Poke D.O.   On: 07/24/2022 10:28  I have personally reviewed the images and agree with the above interpretation.  Assessment and Plan: Mr. Mansouri is a pleasant 72 y.o. male with cervical myelopathy due to cervical stenosis from C4-C6 with myelomalacia at C5-6.  He has clinical signs of cervical myelopathy on my examination and has worsening symptoms over time.  Due to his severe stenosis C5-6 with myelomalacia, I do not feel that further conservative management is indicated.  We discussed that conservative management is not indicated for cervical myelopathy.  Given his subjective weakness and abnormalities on physical examination, I recommended surgical intervention with a C4-6 anterior  cervical discectomy and fusion.  I do not think a posterior approach is appropriate given the amount of disc herniation he is suffering from at C4-5 and C5-6.  I discussed the planned procedure at length with the patient, including the risks, benefits, alternatives, and indications. The risks discussed include but are not limited to bleeding, infection, need for reoperation, spinal fluid leak, stroke, vision loss, anesthetic complication, coma, paralysis, and even death. We also discussed the possibility of post-operative dysphagia, vocal cord paralysis, and the risk of adjacent segment disease in the future. I also  described in detail that improvement was not guaranteed.  The patient expressed understanding of these risks, and asked that we proceed with surgery. I described the surgery in layman's terms, and gave ample opportunity for questions, which were answered to the best of my ability.  I spent a total of 30 minutes in this patient's care today. This time was spent reviewing pertinent records including imaging studies, obtaining and confirming history, performing a directed evaluation, formulating and discussing my recommendations, and documenting the visit within the medical record.   Given his history of heart attack, we will send him for clearance with Dr. Fletcher Anon.  I recommended that he discontinue Plavix 7 days before surgery and restart it 14 days afterwards.   Thank you for involving me in the care of this patient.      Sanav Remer K. Izora Ribas MD, [facility] Neurosurgery

## 2022-08-07 ENCOUNTER — Encounter: Payer: Self-pay | Admitting: Neurosurgery

## 2022-08-07 ENCOUNTER — Ambulatory Visit: Payer: Medicare HMO | Admitting: Neurosurgery

## 2022-08-07 VITALS — BP 110/68 | HR 60 | Ht 67.0 in | Wt 177.0 lb

## 2022-08-07 DIAGNOSIS — M4802 Spinal stenosis, cervical region: Secondary | ICD-10-CM | POA: Diagnosis not present

## 2022-08-07 DIAGNOSIS — G9589 Other specified diseases of spinal cord: Secondary | ICD-10-CM

## 2022-08-07 DIAGNOSIS — G959 Disease of spinal cord, unspecified: Secondary | ICD-10-CM | POA: Diagnosis not present

## 2022-08-07 NOTE — Patient Instructions (Signed)
Please see below for information in regards to your upcoming surgery:  Planned surgery: C4-6 anterior cervical discectomy and fusion   Surgery date: 09/17/22 - you will find out your arrival time the business day before your surgery.   Pre-op appointment at [facility]: we will call you with a date/time for this. Pre-admit testing is located on the first floor of the [facility], [place], [address]. Please bring all prescriptions in the original prescription bottles to your appointment, even if you have reviewed medications by phone with a pharmacy representative. Pre-op labs may be done at your pre-op appointment. You are not required to fast for these labs. Should you need to change your pre-op appointment, please call Pre-admit testing at [phone].    Surgical clearance: we will send a clearance request to your cardiologist    Blood thinners:  Aspirin: ok to stay on aspirin '81mg'$   Plavix:     Stop Plavix 7 days prior, resume Plavix 14 days after   NSAIDS (Non-steroidal anti-inflammatory drugs): because you are having a fusion, no NSAIDS (such as ibuprofen, aleve, naproxen, meloxicam, diclofenac) for 3 months after surgery. Celebrex is an exception. Tylenol is ok because it is not an NSAID.    Home health physical therapy: [facility] [place] will contact you regarding home health physical therapy for after surgery.Their number is [phone].    Because you are having a fusion: for appointments after your 2 week follow-up: please arrive at the [facility] outpatient imaging center ([place], [place]) or [facility] one hour prior to your appointment for x-rays. This applies to every appointment after your 2 week follow-up. Failure to do so may result in your appointment being rescheduled.   If you have FMLA/disability paperwork, please drop it off or fax it to  [phone], attention [place]ty.   We can be reached by phone or mychart 8am-4pm, Monday-Friday. If you have any questions/concerns before or after surgery, you can reach [place] at [phone], or you can send a mychart message. If you have a concern after hours that cannot wait until normal business hours, you can call [phone] and ask to page the neurosurgeon on call for [place].    Appointments/FMLA & disability paperwork: [place] Nurse: Ophelia Shoulder  Medical assistant: Raquel Sarna Physician Assistant's: Cooper Render & Geronimo Boot Surgeon: [facility] Maw, MD

## 2022-08-09 ENCOUNTER — Other Ambulatory Visit: Payer: Self-pay

## 2022-08-09 ENCOUNTER — Telehealth: Payer: Self-pay

## 2022-08-09 DIAGNOSIS — Z01818 Encounter for other preprocedural examination: Secondary | ICD-10-CM

## 2022-08-09 NOTE — Telephone Encounter (Signed)
I spoke with Todd Weaver. He would like to move his surgery up to 08/22/22. I will arrange this.  Patty, can you call him to reschedule his post op appointments? He wasn't home when I spoke with him and asked if you could call him around 10 am tomorrow (Friday).  Appts should be 2 weeks after surgery with Andee Poles, 6 weeks after surgery with Dr Izora Ribas, and 12 weeks after surgery with Andee Poles. Thanks

## 2022-08-09 NOTE — Telephone Encounter (Signed)
-----  Message from Peggyann Shoals sent at 08/09/2022  2:55 PM EST ----- Regarding: move sx up Contact: [phone] C4-6 ACDF on 09/17/22 He has spoken to his brother in-law who is a cardiologist and has recommended that he have his surgery asap. Do you have dates available?

## 2022-08-10 ENCOUNTER — Telehealth: Payer: Self-pay | Admitting: Cardiovascular Disease

## 2022-08-10 NOTE — Telephone Encounter (Signed)
   Pre-operative Risk Assessment    Patient Name: Todd Weaver  DOB: [birth year] MRN: [id number]      Request for Surgical Clearance    Procedure:   C4-6 anterior cervical discectomy and fusion  Date of Surgery:  Clearance 08/22/22                                 Surgeon:  not listed Surgeon's Group or Practice Name:  [facility]uro[facility] Phone number:  [phone] Fax number:  [phone]   Type of Clearance Requested:   - Pharmacy:  Hold Clopidogrel (Plavix) hold 7 days prior and resume 14 days after - ok to stay on aspirin 81 mg   Type of Anesthesia:  General    Additional requests/questions:    Manfred Arch   08/10/2022, 10:41 AM

## 2022-08-10 NOTE — Telephone Encounter (Signed)
I left a message for the patient to call our office to set -up a tele visit for pre-op.

## 2022-08-10 NOTE — Telephone Encounter (Signed)
All appts have been rescheduled and letter mailed with appts.

## 2022-08-10 NOTE — Telephone Encounter (Signed)
Left message to call back.

## 2022-08-10 NOTE — Telephone Encounter (Signed)
   Name: Todd Weaver  DOB: [birth year]  MRN: [id number]  Primary Cardiologist: Kathlyn Sacramento, MD   Preoperative team, please contact this patient and set up a phone call appointment for further preoperative risk assessment. Please obtain consent and complete medication review. Thank you for your help.  I confirm that guidance regarding antiplatelet and oral anticoagulation therapy has been completed and, if necessary, noted below.   His Plavix may be held for 5 to 7 days prior to his surgery.  Please resume Plavix as soon as hemostasis is achieved at the discretion of the surgeon.  Deberah Pelton, NP 08/10/2022, 11:10 AM [place]

## 2022-08-13 ENCOUNTER — Telehealth: Payer: Self-pay | Admitting: *Deleted

## 2022-08-13 NOTE — Telephone Encounter (Signed)
Pt has been scheduled for a tele visit, 08/15/22 9:00.  Consent on file / medications reconciled.

## 2022-08-13 NOTE — Telephone Encounter (Signed)
Patient returned Pre-op call. 

## 2022-08-13 NOTE — Telephone Encounter (Signed)
Pt has been scheduled for a tele visit, 08/15/22 9:00.  Consent on file / medications reconciled.    Patient Consent for Virtual Visit        Todd Weaver has provided verbal consent on 08/13/2022 for a virtual visit (video or telephone).   CONSENT FOR VIRTUAL VISIT FOR:  Todd Weaver  By participating in this virtual visit I agree to the following:  I hereby voluntarily request, consent and authorize [facility] and its employed or contracted physicians, physician assistants, nurse practitioners or other licensed health care professionals (the Practitioner), to provide me with telemedicine health care services (the "Services") as deemed necessary by the treating Practitioner. I acknowledge and consent to receive the Services by the Practitioner via telemedicine. I understand that the telemedicine visit will involve communicating with the Practitioner through live audiovisual communication technology and the disclosure of certain medical information by electronic transmission. I acknowledge that I have been given the opportunity to request an in-person assessment or other available alternative prior to the telemedicine visit and am voluntarily participating in the telemedicine visit.  I understand that I have the right to withhold or withdraw my consent to the use of telemedicine in the course of my care at any time, without affecting my right to future care or treatment, and that the Practitioner or I may terminate the telemedicine visit at any time. I understand that I have the right to inspect all information obtained and/or recorded in the course of the telemedicine visit and may receive copies of available information for a reasonable fee.  I understand that some of the potential risks of receiving the Services via telemedicine include:  Delay or interruption in medical evaluation due to technological equipment failure or disruption; Information transmitted may not be sufficient  (e.g. poor resolution of images) to allow for appropriate medical decision making by the Practitioner; and/or  In rare instances, security protocols could fail, causing a breach of personal health information.  Furthermore, I acknowledge that it is my responsibility to provide information about my medical history, conditions and care that is complete and accurate to the best of my ability. I acknowledge that Practitioner's advice, recommendations, and/or decision may be based on factors not within their control, such as incomplete or inaccurate data provided by me or distortions of diagnostic images or specimens that may result from electronic transmissions. I understand that the practice of medicine is not an exact science and that Practitioner makes no warranties or guarantees regarding treatment outcomes. I acknowledge that a copy of this consent can be made available to me via my patient portal ([facility]), or I can request a printed copy by calling the office of [facility].    I understand that my insurance will be billed for this visit.   I have read or had this consent read to me. I understand the contents of this consent, which adequately explains the benefits and risks of the Services being provided via telemedicine.  I have been provided ample opportunity to ask questions regarding this consent and the Services and have had my questions answered to my satisfaction. I give my informed consent for the services to be provided through the use of telemedicine in my medical care

## 2022-08-15 ENCOUNTER — Ambulatory Visit: Payer: Medicare HMO | Attending: Cardiovascular Disease | Admitting: Nurse Practitioner

## 2022-08-15 ENCOUNTER — Encounter
Admission: RE | Admit: 2022-08-15 | Discharge: 2022-08-15 | Disposition: A | Discharge status: Home / Self Care | Payer: Medicare HMO | Source: Ambulatory Visit | Attending: Neurosurgery | Admitting: Neurosurgery

## 2022-08-15 ENCOUNTER — Encounter: Payer: Self-pay | Admitting: Nurse Practitioner

## 2022-08-15 VITALS — BP 116/79 | HR 58 | Temp 97.4°F | Resp 14 | Ht 67.0 in | Wt 178.0 lb

## 2022-08-15 DIAGNOSIS — I25118 Atherosclerotic heart disease of native coronary artery with other forms of angina pectoris: Secondary | ICD-10-CM | POA: Diagnosis not present

## 2022-08-15 DIAGNOSIS — I5022 Chronic systolic (congestive) heart failure: Secondary | ICD-10-CM | POA: Diagnosis not present

## 2022-08-15 DIAGNOSIS — I10 Essential (primary) hypertension: Secondary | ICD-10-CM | POA: Diagnosis not present

## 2022-08-15 DIAGNOSIS — Z01812 Encounter for preprocedural laboratory examination: Secondary | ICD-10-CM

## 2022-08-15 DIAGNOSIS — Z01810 Encounter for preprocedural cardiovascular examination: Secondary | ICD-10-CM | POA: Diagnosis not present

## 2022-08-15 HISTORY — DX: Chronic embolism and thrombosis of deep veins of left upper extremity: I82.722

## 2022-08-15 LAB — TYPE AND SCREEN
ABO/RH(D): AB NEG
Antibody Screen: NEGATIVE

## 2022-08-15 LAB — BASIC METABOLIC PANEL
Anion gap: 5 (ref 5–15)
BUN: 16 mg/dL (ref 8–23)
CO2: 28 mmol/L (ref 22–32)
Calcium: 9.5 mg/dL (ref 8.9–10.3)
Chloride: 104 mmol/L (ref 98–111)
Creatinine, Ser: 1.14 mg/dL (ref 0.61–1.24)
GFR, Estimated: >60 mL/min (ref >60)
Glucose, Bld: 100 mg/dL — ABNORMAL HIGH (ref 70–99)
Potassium: 4.8 mmol/L (ref 3.5–5.1)
Sodium: 137 mmol/L (ref 135–145)

## 2022-08-15 LAB — SURGICAL PCR SCREEN
MRSA, PCR: NEGATIVE
Staphylococcus aureus: NEGATIVE

## 2022-08-15 NOTE — Patient Instructions (Addendum)
Your procedure is scheduled on: Wednesday, January 31 Report to the Registration Desk on the 1st floor of the [facility]. To find out your arrival time, please call [phone] between 1PM - 3PM on: Tuesday, January 30 If your arrival time is 6:00 am, do not arrive prior to that time as the [place] entrance doors do not open until 6:00 am.  REMEMBER: Instructions that are not followed completely may result in serious medical risk, up to and including death; or upon the discretion of your surgeon and anesthesiologist your surgery may need to be rescheduled.  Do not eat food after midnight the night before surgery.  No gum chewing, lozengers or hard candies.  You may however, drink CLEAR liquids up to 2 hours before you are scheduled to arrive for your surgery. Do not drink anything within 2 hours of your scheduled arrival time.  Clear liquids include: - water  - apple juice without pulp - gatorade (not RED colors) - black coffee or tea (Do NOT add milk or creamers to the coffee or tea) Do NOT drink anything that is not on this list.  TAKE THESE MEDICATIONS THE MORNING OF SURGERY WITH A SIP OF WATER:  Amlodipine Carvedilol Ezetimibe Gabapentin Tramadol if needed for pain  Per Dr. Rhea Bleacher orders:  Aspirin: ok to stay on aspirin '81mg'$  Plavix: Stop Plavix 7 days prior, resume Plavix 14 days after. Last day for Plavix is Tuesday, January 23  One week prior to surgery: starting January 24 Stop Anti-inflammatories (NSAIDS) such as Advil, Aleve, Ibuprofen, Motrin, Naproxen, Naprosyn and Aspirin based products such as Excedrin, Goodys Powder, BC Powder. Stop ANY OVER THE COUNTER supplements until after surgery. You may however, continue to take Tylenol if needed for pain up until the day of surgery.  No Alcohol for 24 hours before or after surgery.  No Smoking including e-cigarettes for 24 hours prior to surgery.  No chewable tobacco products for at least 6 hours prior to  surgery.  No nicotine patches on the day of surgery.  Do not use any "recreational" drugs for at least a week prior to your surgery.  Please be advised that the combination of cocaine and anesthesia may have negative outcomes, up to and including death. If you test positive for cocaine, your surgery will be cancelled.  On the morning of surgery brush your teeth with toothpaste and water, you may rinse your mouth with mouthwash if you wish. Do not swallow any toothpaste or mouthwash.  Use CHG Soap as directed on instruction sheet.  Do not wear jewelry, make-up, hairpins, clips or nail polish.  Do not wear lotions, powders, or perfumes.   Do not shave body from the neck down 48 hours prior to surgery just in case you cut yourself which could leave a site for infection.  Also, freshly shaved skin may become irritated if using the CHG soap.  Contact lenses, hearing aids and dentures may not be worn into surgery.  Do not bring valuables to the hospital. [facility] is not responsible for any missing/lost belongings or valuables.   Notify your doctor if there is any change in your medical condition (cold, fever, infection).  Wear comfortable clothing (specific to your surgery type) to the hospital.  After surgery, you can help prevent lung complications by doing breathing exercises.  Take deep breaths and cough every 1-2 hours. Your doctor may order a device called an Incentive Spirometer to help you take deep breaths.  If you are being  discharged the day of surgery, you will not be allowed to drive home. You will need a responsible adult (18 years or older) to drive you home and stay with you that night.   If you are taking public transportation, you will need to have a responsible adult (18 years or older) with you. Please confirm with your physician that it is acceptable to use public transportation.   Please call the [place] Dept. at [phone] if you have any  questions about these instructions.  Surgery Visitation Policy:  Patients undergoing a surgery or procedure may have two family members or support persons with them as long as the person is not COVID-19 positive or experiencing its symptoms.      Preparing for Surgery with CHLORHEXIDINE GLUCONATE (CHG) Soap  Chlorhexidine Gluconate (CHG) Soap  o An antiseptic cleaner that kills germs and bonds with the skin to continue killing germs even after washing  o Used for showering the night before surgery and morning of surgery  Before surgery, you can play an important role by reducing the number of germs on your skin.  CHG (Chlorhexidine gluconate) soap is an antiseptic cleanser which kills germs and bonds with the skin to continue killing germs even after washing.  Please do not use if you have an allergy to CHG or antibacterial soaps. If your skin becomes reddened/irritated stop using the CHG.  1. Shower the NIGHT BEFORE SURGERY and the MORNING OF SURGERY with CHG soap.  2. If you choose to wash your hair, wash your hair first as usual with your normal shampoo.  3. After shampooing, rinse your hair and body thoroughly to remove the shampoo.  4. Use CHG as you would any other liquid soap. You can apply CHG directly to the skin and wash gently with a scrungie or a clean washcloth.  5. Apply the CHG soap to your body only from the neck down. Do not use on open wounds or open sores. Avoid contact with your eyes, ears, mouth, and genitals (private parts). Wash face and genitals (private parts) with your normal soap.  6. Wash thoroughly, paying special attention to the area where your surgery will be performed.  7. Thoroughly rinse your body with warm water.  8. Do not shower/wash with your normal soap after using and rinsing off the CHG soap.  9. Pat yourself dry with a clean towel.  10. Wear clean pajamas to bed the night before surgery.  12. Place clean sheets on your bed the night  of your first shower and do not sleep with pets.  13. Shower again with the CHG soap on the day of surgery prior to arriving at the hospital.  14. Do not apply any deodorants/lotions/powders.  15. Please wear clean clothes to the hospital.

## 2022-08-15 NOTE — Progress Notes (Signed)
Virtual Visit via Telephone Note   Because of Todd Weaver's co-morbid illnesses, he is at least at moderate risk for complications without adequate follow up.  This format is felt to be most appropriate for this patient at this time.  The patient did not have access to video technology/had technical difficulties with video requiring transitioning to audio format only (telephone).  All issues noted in this document were discussed and addressed.  No physical exam could be performed with this format.  Please refer to the patient's chart for his consent to telehealth for [facility].  Evaluation Performed:  Preoperative cardiovascular risk assessment _____________   Date:  08/15/2022   Patient ID:  Todd Weaver, DOB [birth year], MRN [id number] Patient Location:  Home Provider location:   Office  Primary Care Provider:  Glean Hess, MD Primary Cardiologist:  Kathlyn Sacramento, MD  Chief Complaint / Patient Profile   72 y.o. y/o male with a h/o CAD s/p STEMI, V-fib arrest, DES x 2 overlapping-o-pRCA in 2018, DES-p/oLAD in 2019, paroxysmal atrial fibrillation, chronic combined systolic and diastolic heart failure, CVA, PAD, hypertension, hyperlipidemia, prior tobacco use, and BPH who is pending C4-6 anterior cervical discectomy and fusion on 08/22/2022 with Dr. Elayne Weaver [facility] Neurosurgery and presents today for telephonic preoperative cardiovascular risk assessment.  History of Present Illness    Todd Weaver is a 72 y.o. male who presents via audio/video conferencing for a telehealth visit today.  Pt was last seen in cardiology clinic on 05/18/2022 by [name], PA.  At that time Todd Weaver was doing well.  The patient is now pending procedure as outlined above. Since his last visit, he has done well from a cardiac standpoint.   He denies chest pain, palpitations, dyspnea, pnd, orthopnea, n, v, dizziness, syncope, edema, weight gain, or  early satiety. All other systems reviewed and are otherwise negative except as noted above.   Past Medical History    Past Medical History:  Diagnosis Date   Acute ST elevation myocardial infarction (STEMI) of inferior wall ([place]) 07/18/2017   BPH (benign prostatic hyperplasia) 06/19/2017   Carpal tunnel syndrome of right wrist 12/26/2020   Chest pain 9/62/8366   Chronic systolic congestive heart failure ([place]) 09/29/2020   Contrast media allergy    Coronary artery disease of native artery of native heart with stable angina pectoris ([place]) 10/03/2018   cardiac catheterization April 2019 showed widely patent LAD and RCA stent with no significant restenosis.  The ramus branch was pinched by the LAD stent with 70% ostial stenosis.  EF was 50 to 55%  12/2020: Myoview: T wave inversion was noted during stress in the V6, V5, II, III and aVF  leads.   There was no ST segment deviation noted during stress.   The study is normal.   This is a low risk s   ED (erectile dysfunction) 06/19/2017   Essential hypertension 06/19/2017   GERD (gastroesophageal reflux disease)    Hand dermatitis 07/26/2014   Hemorrhoid    History of CVA (cerebrovascular accident) 06/19/2017   History of ST elevation myocardial infarction (STEMI) 07/18/2017   07/18/2017 s/p PCI/DES x 2 to the RCA with residual disease involving the distal left main/ostial LAD, proximal LAD, mid LAD, and LCx      Hyperlipidemia    Hypertension    Impingement syndrome of left shoulder region 10/14/2018   Ischemic cardiomyopathy    a. 06/2017 Echo: EF 45-50%, mod inf/infsept HK. Mildly  dil LA; b. 09/2019 Echo: EF 45-50%, Gr2 DD. Mild MR.   Lumbar spondylosis with myelopathy 11/08/2016   MCI (mild cognitive impairment) 06/19/2017   Mixed hyperlipidemia 06/19/2017       Neck muscle spasm 12/26/2020   Overweight (BMI 25.0-29.9) 06/19/2017   PAD (peripheral artery disease) ([place])    a. 01/2019 ABIs and Duplex: ABI R 0.78, L 0.79; TBI R 0.66, L 1.11. RCIA  >50p, RSFA 50-74. LCIA >50p, LSFA 75-99p, 30-49d. 3 vessel runoff bilat.   PAF (paroxysmal atrial fibrillation) ([place])    a. Brief episode during December hospitalization-->converted on IV amio-->not on [place].   Rectal polyp    Stroke [facility]) 2011   "2 MINI STROKES". no deficits.   Thrombophilia ([place]) 09/29/2020   Urinary hesitancy 12/26/2020   Past Surgical History:  Procedure Laterality Date   ANAL FISTULECTOMY N/A 12/21/2014   Procedure: FISTULECTOMY ANAL;  Surgeon: Christene Lye, MD;  Location: [facility] ORS;  Service: General;  Laterality: N/A;   APPENDECTOMY  1975   CARDIAC CATHETERIZATION     COLONOSCOPY WITH PROPOFOL N/A 02/12/2019   Procedure: COLONOSCOPY WITH PROPOFOL;  Surgeon: Lucilla Lame, MD;  Location: [facility] ENDOSCOPY;  Service: Endoscopy;  Laterality: N/A;   CORONARY STENT INTERVENTION N/A 09/09/2017   Procedure: CORONARY STENT INTERVENTION;  Surgeon: Wellington Hampshire, MD;  Location: [place] CV LAB;  Service: Cardiovascular;  Laterality: N/A;   CORONARY/GRAFT ACUTE MI REVASCULARIZATION N/A 07/18/2017   Procedure: Coronary/Graft Acute MI Revascularization;  Surgeon: Wellington Hampshire, MD;  Location: [place] CV LAB;  Service: Cardiovascular;  Laterality: N/A;   HEMORRHOID SURGERY N/A 12/21/2014   Procedure: HEMORRHOIDECTOMY;  Surgeon: Christene Lye, MD;  Location: [facility] ORS;  Service: General;  Laterality: N/A;   INTRAVASCULAR ULTRASOUND/IVUS N/A 09/09/2017   Procedure: Intravascular Ultrasound/IVUS;  Surgeon: Wellington Hampshire, MD;  Location: [place] CV LAB;  Service: Cardiovascular;  Laterality: N/A;   KNEE SURGERY Right age 64   LEFT HEART CATH AND CORONARY ANGIOGRAPHY N/A 07/18/2017   Procedure: LEFT HEART CATH AND CORONARY ANGIOGRAPHY;  Surgeon: Wellington Hampshire, MD;  Location: [place] CV LAB;  Service: Cardiovascular;  Laterality: N/A;   LEFT HEART CATH AND CORONARY ANGIOGRAPHY Left 08/19/2017   Procedure: LEFT HEART CATH AND CORONARY ANGIOGRAPHY;   Surgeon: Wellington Hampshire, MD;  Location: [place] CV LAB;  Service: Cardiovascular;  Laterality: Left;   LEFT HEART CATH AND CORONARY ANGIOGRAPHY N/A 11/07/2017   Procedure: LEFT HEART CATH AND CORONARY ANGIOGRAPHY;  Surgeon: Wellington Hampshire, MD;  Location: [place] CV LAB;  Service: Cardiovascular;  Laterality: N/A;   LEFT HEART CATH AND CORONARY ANGIOGRAPHY N/A 10/17/2021   Procedure: LEFT HEART CATH AND CORONARY ANGIOGRAPHY;  Surgeon: Wellington Hampshire, MD;  Location: [place] CV LAB;  Service: Cardiovascular;  Laterality: N/A;   ROTATOR CUFF REPAIR Right 2013    Allergies  Allergies  Allergen Reactions   Codeine Hives and Other (See Comments)   Shellfish Allergy Hives and Swelling   Contrast Media [Iodinated Contrast Media] Itching and Other (See Comments)    Whelts on tongue   Fish Allergy Itching    Home Medications    Prior to Admission medications   Medication Sig Start Date End Date Taking? Authorizing Provider  amLODipine (NORVASC) 5 MG tablet Take 5 mg by mouth daily.    [provider]  aspirin EC 81 MG tablet Take 81 mg by mouth daily.  05/23/09   [provider]  carvedilol (COREG)  3.125 MG tablet TAKE 1 TABLET TWICE DAILY WITH MEALS 05/15/22   Minna Merritts, MD  clopidogrel (PLAVIX) 75 MG tablet Take 1 tablet (75 mg total) by mouth daily. 06/18/22   Glean Hess, MD  clotrimazole-betamethasone (LOTRISONE) cream Apply 1 Application topically 2 (two) times daily. 04/17/22   Glean Hess, MD  ezetimibe (ZETIA) 10 MG tablet Take 1 tablet (10 mg total) by mouth daily. 06/18/22   Glean Hess, MD  furosemide (LASIX) 20 MG tablet Take 20 mg by mouth daily.    [provider]  gabapentin (NEURONTIN) 100 MG capsule Take 200 mg by mouth 2 (two) times daily. 05/24/22   [provider]  nitroGLYCERIN (NITROSTAT) 0.4 MG SL tablet Place 1 tablet (0.4 mg total) under the tongue every 5 (five) minutes as needed for  chest pain. 06/18/22   Glean Hess, MD  oxybutynin (DITROPAN-XL) 10 MG 24 hr tablet Take 10 mg by mouth daily. 05/27/22   [provider]  potassium chloride (KLOR-CON M) 10 MEQ tablet TAKE 1 TABLET EVERY DAY 07/12/22   Glean Hess, MD  rosuvastatin (CRESTOR) 40 MG tablet Take 1 tablet (40 mg total) by mouth daily. 04/19/22   Glean Hess, MD  sacubitril-valsartan (ENTRESTO) 24-26 MG Take 1 tablet by mouth 2 (two) times daily. 06/28/22   Wellington Hampshire, MD  sildenafil (REVATIO) 20 MG tablet Take 1 tablet (20 mg total) by mouth 3 (three) times daily as needed. 02/12/22   Dunn, Areta Haber, PA-C  spironolactone (ALDACTONE) 25 MG tablet Take 1 tablet (25 mg total) by mouth daily. 05/18/22 08/16/22  Rise Mu, PA-C  tamsulosin (FLOMAX) 0.4 MG CAPS capsule Take 1 capsule (0.4 mg total) by mouth daily. 12/19/21   Billey Co, MD  temazepam (RESTORIL) 15 MG capsule Take 1 capsule (15 mg total) by mouth at bedtime as needed for sleep. 06/18/22   Glean Hess, MD  traMADol (ULTRAM) 50 MG tablet Take 50 mg by mouth every 6 (six) hours as needed (For 30 doses.). 05/28/22   [provider]    Physical Exam    Vital Signs:  Todd Weaver does not have vital signs available for review today.  Given telephonic nature of communication, physical exam is limited. AAOx3. NAD. Normal affect.  Speech and respirations are unlabored.  Accessory Clinical Findings    None  Assessment & Plan    1.  Preoperative Cardiovascular Risk Assessment:  According to the Revised Cardiac Risk Index (RCRI), his Perioperative Risk of Major Cardiac Event is (%): 11. His Functional Capacity in METs is: 8.97 according to the [place] Activity Status Index (DASI).Therefore, based on ACC/AHA guidelines, patient would be at acceptable risk for the planned procedure without further cardiovascular testing.   The patient was advised that if he develops new symptoms prior to surgery to contact our  office to arrange for a follow-up visit, and he verbalized understanding.  His Plavix may be held for 5 to 7 days prior to his surgery. Please resume Plavix as soon as possible postprocedure, at the discretion of the surgeon.   A copy of this note will be routed to requesting surgeon.  Time:   Today, I have spent 7 minutes with the patient with telehealth technology discussing medical history, symptoms, and management plan.     Lenna Sciara, NP  08/15/2022, 9:09 AM

## 2022-08-16 ENCOUNTER — Encounter: Payer: Self-pay | Admitting: Neurosurgery

## 2022-08-16 NOTE — Progress Notes (Signed)
Perioperative Services  Pre-Admission/Anesthesia Testing Clinical Review  Date: 08/17/22  Patient Demographics:  Name: Todd Weaver DOB:   [birth year] MRN:   [id number]  Planned Surgical Procedure(s):    Case: [id number] Date/Time: 08/22/22 0700   Procedure: C4-6 ANTERIOR CERVICAL DISCECTOMY AND FUSION (NUVASIVE ACP, GLOBUS HEDRON)   Anesthesia type: General   Pre-op diagnosis:      G95.9 Cervical myelopathy     M48.02 Cervical stenosis of spinal canal     G95.89 Myelomalacia   Location: [facility] OR ROOM 03 / [place] [facility]   Surgeons: Meade Maw, MD   NOTE: Available PAT nursing documentation and vital signs have been reviewed. Clinical nursing staff has updated patient's PMH/PSHx, current medication list, and drug allergies/intolerances to ensure comprehensive history available to assist in medical decision making as it pertains to the aforementioned surgical procedure and anticipated anesthetic course. Extensive review of available clinical information performed. Todd Weaver PMH and PSHx updated with any diagnoses/procedures that  may have been inadvertently omitted during his intake with the pre-admission testing department's nursing staff.  Clinical Discussion:  Todd Weaver is a 72 y.o. male who is submitted for pre-surgical anesthesia review and clearance prior to him undergoing the above procedure. Patient is a Former Smoker (quit 07/2007). Pertinent PMH includes: CAD, inferior STEMI, angina, ventricular fibrillation cardiac arrest, ischemic cardiomyopathy, CHF, PAF, CVA, PAD, DVT, HTN, HLD, GERD (no daily Tx), thrombophilia, OA, cervical spinal stenosis with associated myelomalacia, lumbar spondylosis with myelopathy, ED (on PDE5i), BPH, mild cognitive impairment, ETOH use.  Patient is followed by cardiology Fletcher Anon, MD). He was last seen in the cardiology clinic on 05/18/2022; notes reviewed.  At the time of this clinic visit, patient doing well overall from a  cardiovascular perspective.  He denied any acute cardiovascular symptoms.  No chest pain, shortness of breath, PND, orthopnea, palpitations, significant peripheral edema, vertiginous symptoms, or presyncope/syncope.  Patient with intermittent paresthesias in his hands.  Patient with a past medical history significant for cardiovascular diagnoses.  Patient suffered an inferior wall STEMI on 07/18/2017.  In the setting of his inferior STEMI, patient developed ventricular fibrillation cardiac arrest requiring defibrillation x 1 in the field by EMS and then again upon presentation to the emergency department.  Defibrillation x 2 to restored stable perfusing rhythm. He underwent diagnostic LEFT heart catheterization on 07/18/2017 revealing multivessel CAD; 99% ostial to mid RCA, 60% post atrial, 85% distal LM-ostial LAD, 40% proximal LCx, 60% proximal LAD, and 40% mid LAD.  PCI was performed placing overlapping 3.0 x 38 mm and 3.25 x 18 mm Xience Sierra stents x 2 to the RCA yielding excellent angiographic result and TIMI-3 flow.  During his hospitalization for his STEMI, patient developed a brief episode of paroxysmal atrial fibrillation.  Atrial dysrhythmia was treated with intravenous amiodarone, which restored NSR.  Medication was discontinued prior to discharge.  Patient was not started on long-term oral anticoagulation therapy.  Repeat diagnostic LEFT heart catheterization on 08/19/2017 revealing multivessel CAD; 85% distal LM-ostial LAD, 60% proximal LAD, 40% mid LAD, 40% proximal LCx, and 50% ostial ramus intermedius.  Previously placed stents were noted be widely patent.  PTCA was performed.  Plans are for staged PCI.  Patient underwent staged PCI procedure on 09/09/2017 placing a 3.0 x 38 mm Xience Sierra DES to the distal LM-ostial LAD lesion yielding excellent angiographic result and TIMI-3 flow.  Repeat diagnostic left heart catheterization was performed on 11/07/2017 revealing multivessel CAD; 30%  mid LAD, 40% proximal LCx,  70% ostial ramus intermedius, and 40% distal LAD.  Previously placed stents noted to be widely patent.  Further intervention was deferred opting for medical management.  Most recent cardiac catheterization was performed on 10/17/2021 revealing multivessel CAD; 70% ostial ramus intermedius, 20% mid LAD, and 30% proximal LCx.  Again, all previously placed stents were noted to be widely patent.  Further intervention was deferred opting for continued aggressive medical management for coronary artery disease.  Long-term cardiac event monitor study was performed on 01/05/2022 revealing a predominant underlying sinus rhythm with an average rate of 66 bpm; range 42-136 bpm.  There was 1 run of SVT lasting 6 beats with a maximum rate of 145 bpm.  Rare atrial and ventricular ectopy with a burden of <1% noted.  There was no significant arrhythmia noted overall.  Most recent TTE was performed on 04/18/2022 revealing a mildly reduced left ventricular systolic function with an EF of 45-50%.  There was global hypokinesis. Left ventricular diastolic Doppler parameters consistent with pseudonormalization (G2DD).  Right ventricular size and function normal.  No significant valvular regurgitation noted.  All transvalvular gradients were noted to be normal with no evidence suggestive of valvular stenosis.  Given his PVD and previous STEMI, patient remains on daily DAPT therapy (ASA + clopidogrel).  Patient reportedly compliant with therapy with no evidence or reports of GI bleeding.  Patient with an ischemic cardiomyopathy diagnosis with resulting CHF.  Exam did not reveal any concerning findings for decompensation.  Heart failure and blood pressure well-managed on currently prescribed CCB (amlodipine), beta-blocker (carvedilol), diuretic (spironolactone), ARB/ANRi (Entresto) therapies.  Patient is on rosuvastatin + ezetimibe for his HLD diagnosis and further ASCVD prevention.  Patient has a supply of  short acting nitrates to use on an as needed basis for recurrent angina/anginal equivalent symptoms; denied recent use.  Of note, in the setting of known ischemic cardiomyopathy, it is important to note that patient is on a PDE5i (sildenafil) for and erectile dysfunction diagnosis.  He is not diabetic. Patient does not have an OSAH diagnosis. Functional capacity, as defined by DASI, is documented as being >/= 4 METS.  No changes were made to his medication regimen.  Patient to follow-up with outpatient cardiology in 4 months or sooner if needed.  Todd Weaver is scheduled for a C4-6 ANTERIOR CERVICAL DISCECTOMY AND FUSION (NUVASIVE ACP, GLOBUS HEDRON) on 08/22/2022 with Dr. Meade Maw, MD.  Given patient's past medical history significant for cardiovascular diagnoses, presurgical cardiac clearance was sought by the PAT team.  Per cardiology, "according to the [facility]), his perioperative risk of major cardiac event is 11%. His functional capacity in METs is 8.97 according to the DASI. Therefore, based on ACC/AHA guidelines, patient would be at an ACCEPTABLE risk for the planned procedure without further cardiovascular testing".  Again, this patient is on daily DAPT therapy.  He has been instructed on recommendations for holding his clopidogrel dose for 7 days prior to his procedure with plans to restart as soon as postoperative bleeding risk felt to be minimized by his primary attending surgeon.  Patient is aware that his last dose of clopidogrel should be on 08/14/2021.  Given his cardiovascular history, patient will continue his daily low-dose ASA throughout his perioperative course.  Patient denies previous perioperative complications with anesthesia in the past. In review of the available records, it is noted that patient underwent a general anesthetic course here at [facility] (ASA [facility]) in 01/2019 without documented complications.  08/15/2022   11:15 AM  08/07/2022    9:09 AM 07/13/2022   12:03 AM  Vitals with BMI  Height '5\' 7"'$  '5\' 7"'$    Weight 178 lbs 177 lbs   BMI 28.00 34.91   Systolic 791 505 697  Diastolic 79 68 71  Pulse 58 60 58    Providers/Specialists:   NOTE: Primary physician provider listed below. Patient may have been seen by APP or partner within same practice.   PROVIDER ROLE / SPECIALTY LAST Dola Factor, MD Neurosurgery (Surgeon) 08/07/2022  Glean Hess, MD Primary Care Provider 06/18/2022  Kathlyn Sacramento, MD Cardiology 05/18/2022   Allergies:  Codeine, Shellfish allergy, Contrast media [iodinated contrast media], and Fish allergy  Current Home Medications:   No current facility-administered medications for this encounter.    amLODipine (NORVASC) 5 MG tablet   aspirin EC 81 MG tablet   carvedilol (COREG) 3.125 MG tablet   clopidogrel (PLAVIX) 75 MG tablet   clotrimazole-betamethasone (LOTRISONE) cream   ezetimibe (ZETIA) 10 MG tablet   gabapentin (NEURONTIN) 300 MG capsule   nitroGLYCERIN (NITROSTAT) 0.4 MG SL tablet   oxybutynin (DITROPAN-XL) 10 MG 24 hr tablet   potassium chloride (KLOR-CON M) 10 MEQ tablet   rosuvastatin (CRESTOR) 40 MG tablet   sacubitril-valsartan (ENTRESTO) 49-51 MG   sildenafil (REVATIO) 20 MG tablet   spironolactone (ALDACTONE) 25 MG tablet   tamsulosin (FLOMAX) 0.4 MG CAPS capsule   tiZANidine (ZANAFLEX) 2 MG tablet   traMADol (ULTRAM) 50 MG tablet   History:   Past Medical History:  Diagnosis Date   Acute ST elevation myocardial infarction (STEMI) of inferior wall (HCC) 07/18/2017   a.) LHC/PCI 07/18/2017: 99% o-mRCA --> overlapping 3.0 x 38 mm and 3.25 x 18 mm Xience Sierra DES   Alcohol dependence, daily use ([place])    a.) 3-4 beers daily   Angina pectoris ([place]) 11/06/2017   BPH (benign prostatic hyperplasia) 06/19/2017   Cardiac arrest with ventricular fibrillation ([place]) 07/18/2017   a.) in setting of acute inferior STEMI; required defib in field by  EMS and again in the ED   Carpal tunnel syndrome of right wrist 12/26/2020   Cervical spinal stenosis    Chronic deep vein thrombosis (DVT) of brachial vein of left upper extremity ([place])    Chronic systolic congestive heart failure ([place]) 09/29/2019   a.) TTE 09/29/2019: EF 45-50%, glob HK, mild MR, mild PA dil, G2DD; b.) TTE 11/28/2021: EF 45-50%, glob HK, G2DD; c.) TTE 04/18/2022: EF 45-50%, glob HK, G2DD   Coronary artery disease of native artery of native heart with stable angina pectoris ([place]) 07/18/2017   a.) LHC/PCI 07/18/17: 99 o-mRCA (overlapping 3.0x70m & 3.25x141mXience Sierra DES), 60 post atrio, 85 dLM-oLAD, 40 pLCx, 60 pLAD, 40 mLAD; b.) LHC 08/19/17: 85 dLM-oLAD (PTCA), 60 pLAD, 30 mLAD, 40 pLCx, 50 RI; c.) LHC/PCI 09/09/17: unchanged LHC. 3.0 x 2882mience Sierra DES dLM-oLAD; d.) LHC 11/07/17: 30 mLM, 40 pLCx, 70 34, 40 dLAD - Rx mgmt; e.) LHC 10/17/21: 70 oRI, 20 mLAD, 30 pLCx - Rx mgmt   ED (erectile dysfunction) 06/19/2017   a.) on PDE5i (sildenafil)   Essential hypertension 06/19/2017   GERD (gastroesophageal reflux disease)    Hand dermatitis 07/26/2014   Hemorrhoid    Hyperlipidemia    Hypertension    Impingement syndrome of left shoulder region 10/14/2018   Ischemic cardiomyopathy    a.) TTE 07/18/2017: EF 45-50%, mod inf/infsept HK, mild LAE, LVH; b.) LHC 08/19/2017: EF 45%;  c.) TTE 09/29/2019: EF 45-50%, glob HK, G2DD; d.) MPI 01/18/2021: EF 47%; e.) TTE 11/28/2021: EF 45-50%, glob HK, G2DD; f.) TTE 04/18/2022:  EF 45-50%, glob HK, G2DD   Long term current use of antithrombotics/antiplatelets    a.) on DAPT (ASA + clopidogrel)   Lumbar spondylosis with myelopathy 11/08/2016   MCI (mild cognitive impairment) 06/19/2017   Myelomalacia of cervical cord (HCC)    Overweight (BMI 25.0-29.9) 06/19/2017   PAD (peripheral artery disease) ([place])    a. 01/2019 ABIs and Duplex: ABI R 0.78, L 0.79; TBI R 0.66, L 1.11. RCIA >50p, RSFA 50-74. LCIA >50p, LSFA 75-99p, 30-49d. 3 vessel  runoff bilat.   PAF (paroxysmal atrial fibrillation) ([place])    a. Brief episode during December hospitalization-->converted on IV amio-->not on [place].   Rectal polyp    Stroke ([place]) 09/26/2005   a.) seen acutely on brain MRI 09/26/2005 --> posterior frontal, mid parietal lobe, RIGHT occipital lobe --> tiny ischemic infarctions secondary to a shower of emboli  (watershed ischemic pattern may be present)   Thrombophilia ([place]) 09/29/2020   Urinary hesitancy 12/26/2020   Past Surgical History:  Procedure Laterality Date   ANAL FISTULECTOMY N/A 12/21/2014   Procedure: FISTULECTOMY ANAL;  Surgeon: [place]ene Lye, MD;  Location: [facility] ORS;  Service: General;  Laterality: N/A;   [place] N/A 02/12/2019   Procedure: COLONOSCOPY WITH PROPOFOL;  Surgeon: Lucilla Lame, MD;  Location: [facility] ENDOSCOPY;  Service: Endoscopy;  Laterality: N/A;   CORONARY STENT INTERVENTION N/A 09/09/2017   Procedure: CORONARY STENT INTERVENTION;  Surgeon: Wellington Hampshire, MD;  Location: [place] CV LAB;  Service: Cardiovascular;  Laterality: N/A;   CORONARY/GRAFT ACUTE MI REVASCULARIZATION N/A 07/18/2017   Procedure: Coronary/Graft Acute MI Revascularization;  Surgeon: Wellington Hampshire, MD;  Location: [place] [facility] LAB;  Service: Cardiovascular;  Laterality: N/A;   HEMORRHOID SURGERY N/A 12/21/2014   Procedure: HEMORRHOIDECTOMY;  Surgeon: [place]ene Lye, MD;  Location: [facility] ORS;  Service: General;  Laterality: N/A;   INTRAVASCULAR ULTRASOUND/IVUS N/A 09/09/2017   Procedure: Intravascular Ultrasound/IVUS;  Surgeon: Wellington Hampshire, MD;  Location: [place] CV LAB;  Service: Cardiovascular;  Laterality: N/A;   KNEE SURGERY Right age 75   LEFT HEART CATH AND CORONARY ANGIOGRAPHY N/A 07/18/2017   Procedure: LEFT HEART CATH AND CORONARY ANGIOGRAPHY;  Surgeon: Wellington Hampshire, MD;  Location: [place] [facility] LAB;  Service: Cardiovascular;   Laterality: N/A;   LEFT HEART CATH AND CORONARY ANGIOGRAPHY Left 08/19/2017   Procedure: LEFT HEART CATH AND CORONARY ANGIOGRAPHY;  Surgeon: Wellington Hampshire, MD;  Location: [place] [facility] LAB;  Service: Cardiovascular;  Laterality: Left;   LEFT HEART CATH AND CORONARY ANGIOGRAPHY N/A 11/07/2017   Procedure: LEFT HEART CATH AND CORONARY ANGIOGRAPHY;  Surgeon: Wellington Hampshire, MD;  Location: [place] [facility] LAB;  Service: Cardiovascular;  Laterality: N/A;   LEFT HEART CATH AND CORONARY ANGIOGRAPHY N/A 10/17/2021   Procedure: LEFT HEART CATH AND CORONARY ANGIOGRAPHY;  Surgeon: Wellington Hampshire, MD;  Location: [place] [facility];  Service: Cardiovascular;  Laterality: N/A;   ROTATOR CUFF REPAIR Right 2013   Family History  Problem Relation Age of Onset   Hypertension Mother    Alzheimer's disease Mother    Hypertension Father    Alzheimer's disease Father    Social History   Tobacco Use   Smoking status: Former    Types: Cigarettes  Quit date: 07/24/2007    Years since quitting: 15.0    Passive exposure: Past   Smokeless tobacco: Former    Types: Chew   Tobacco comments:    smoking cessation materials not required  Vaping Use   Vaping Use: Never used  Substance Use Topics   Alcohol use: Yes    Alcohol/week: 5.0 - 6.0 standard drinks of alcohol    Types: 5 - 6 Cans of beer per week    Comment:  weekly   Drug use: No    Pertinent Clinical Results:  LABS: Labs reviewed: Acceptable for surgery.  Lab Results  Component Value Date   WBC 8.2 07/12/2022   HGB 14.1 07/12/2022   HCT 41.8 07/12/2022   MCV 83.9 07/12/2022   PLT 292 07/12/2022   Hospital Outpatient Visit on 08/15/2022  Component Date Value Ref Range Status   MRSA, PCR 08/15/2022 NEGATIVE  NEGATIVE Final   Staphylococcus aureus 08/15/2022 NEGATIVE  NEGATIVE Final   Comment: (NOTE) The Xpert SA Assay (FDA approved for NASAL specimens in patients 43 years of age and older), is one component of a  comprehensive surveillance program. It is not intended to diagnose infection nor to guide or monitor treatment. Performed at [facility], [address] [place], [place] [address]    ABO/RH(D) 08/15/2022 AB NEG   Final   Antibody Screen 08/15/2022 NEG   Final   Sample Expiration 08/15/2022 08/29/2022,2359   Final   Extend sample reason 08/15/2022    Final                   Value:NO TRANSFUSIONS OR PREGNANCY IN THE PAST 3 MONTHS Performed at [facility], [place], [place] [address]    Sodium 08/15/2022 137  135 - 145 mmol/L Final   Potassium 08/15/2022 4.8  3.5 - 5.1 mmol/L Final   Chloride 08/15/2022 104  98 - 111 mmol/L Final   CO2 08/15/2022 28  22 - 32 mmol/L Final   Glucose, Bld 08/15/2022 100 (H)  70 - 99 mg/dL Final   Glucose reference range applies only to samples taken after fasting for at least 8 hours.   BUN 08/15/2022 16  8 - 23 mg/dL Final   Creatinine, Ser 08/15/2022 1.14  0.61 - 1.24 mg/dL Final   Calcium 08/15/2022 9.5  8.9 - 10.3 mg/dL Final   GFR, Estimated 08/15/2022 >60  >60 mL/min Final   Comment: (NOTE) Calculated using the CKD-EPI Creatinine Equation (2021)    Anion gap 08/15/2022 5  5 - 15 Final   Performed at [facility], [address] [place], [place] [address]    ECG: Date: 07/12/2022 Time ECG obtained: 2021 PM Rate: 56 bpm Rhythm: sinus bradycardia Axis (leads I and aVF): Normal Intervals: PR 148 ms. QRS 92 ms. QTc 411 ms. ST segment and T wave changes: No evidence of acute ST segment elevation or depression Comparison: Similar to previous tracing obtained on 05/18/2022   IMAGING / PROCEDURES: MR CERVICAL SPINE WO CONTRAST performed on 07/20/2022 Degenerative disc disease at C5-6 resulting in severe canal stenosis with cord compression. Increased signal within the cord at this level is likely reflective of chronic myelomalacia, although cord edema not excluded. There is also severe bilateral  foraminal stenosis at this level. Moderate canal stenosis at C4-5 with mild bilateral foraminal stenosis. Mild canal stenosis at C3-4 and C6-7.  TRANSTHORACIC ECHOCARDIOGRAM performed on 04/18/2022 Left ventricular ejection fraction, by estimation, is 45 to 50%.  The left ventricle has mildly decreased function. The left ventricle demonstrates global hypokinesis. Left ventricular diastolic parameters are  consistent with Grade II diastolic  dysfunction (pseudonormalization).  Right ventricular systolic function is normal.  The mitral valve is normal in structure. No evidence of mitral valve regurgitation.   LONG TERM CARDIAC EVENT MONITOR STUDY performed on  01/05/2022 Patch Wear Time:  13 days and 23 hours   Patient had a min HR of 42 bpm, max HR of 136 bpm, and avg HR of 66 bpm.  Predominant underlying rhythm was Sinus Rhythm.   1 run of Supraventricular Tachycardia occurred lasting 6 beats with a max rate of 135 bpm (avg 105 bpm). Rare PACs and rare PVCs with a burden of less than 1%. No significant arrhythmia overall.  LEFT HEART CATHETERIZATION AND CORONARY ANGIOGRAPHY performed on 10/17/2021 Normal left ventricular systolic function with an EF of 45-50% Mildly elevated LVEDP = 23 mmHg Widely patent RCA and LAD stents with no significant ISR Multivessel CAD 70% ostial ramus intermedius 20% mid LAD 30% proximal LCx Recommendations No culprit lesion identified from patient's symptoms Continue aggressive medical therapy for coronary artery disease    MYOCARDIAL PERFUSION IMAGING STUDY (LEXISCAN) performed on 01/18/2021 Mildly decreased left ventricular systolic function with a EF of 47% T wave inversions noted during stress and leads II, III, aVF, V5-V6 No ST segment deviation noted during stress No evidence for significant ischemia Normal low risk study  Impression and Plan:  ROMYN BOSWELL has been referred for pre-anesthesia review and clearance prior to him undergoing the  planned anesthetic and procedural courses. Available labs, pertinent testing, and imaging results were personally reviewed by me. This patient has been appropriately cleared by cardiology with an overall ACCEPTABLE risk of significant perioperative cardiovascular complications.  Based on clinical review performed today (08/17/22), barring any significant acute changes in the patient's overall condition, it is anticipated that he will be able to proceed with the planned surgical intervention. Any acute changes in clinical condition may necessitate his procedure being postponed and/or cancelled. Patient will meet with anesthesia team (MD and/or CRNA) on the day of his procedure for preoperative evaluation/assessment. Questions regarding anesthetic course will be fielded at that time.   Pre-surgical instructions were reviewed with the patient during his PAT appointment and questions were fielded by PAT clinical staff. Patient was advised that if any questions or concerns arise prior to his procedure then he should return a call to PAT and/or his surgeon's office to discuss.  Honor Loh, MSN, APRN, FNP-C, CEN [facility]  Peri-operative Services Nurse Practitioner Phone: [phone] Fax: [phone] 08/17/22 4:24 PM  NOTE: This note has been prepared using Dragon dictation software. Despite my best ability to proofread, there is always the potential that unintentional transcriptional errors may still occur from this process.

## 2022-08-17 ENCOUNTER — Encounter: Payer: Self-pay | Admitting: Neurosurgery

## 2022-08-17 ENCOUNTER — Telehealth: Payer: Self-pay | Admitting: Cardiovascular Disease

## 2022-08-17 NOTE — Telephone Encounter (Signed)
I s/w the pt about setting up a tele appt for pre op. Pt said he did a televisit the other day. I reviewed the chart and the pt did indeed have a televisit with Diona Browner, NP 08/15/22. In review of notes from NP 08/15/22 pt was cleared and ok to hold Plavix x 7 days.   Nicholes Rough, PAC pre op APP today stated the pt needs a tele appt. I have review notes from Nicholes Rough, [facility] as well and there is a note for Dr. Fletcher Anon to give his input/recommendations about holding Plavix x 14 days post op per the surgeon, pt will hold hold Plavix x 7 days prior and 14 days post op.   Pt tells me that also his surgery was moved up to 08/22/22. I informed the pt that we got a request today that said 09/17/22; however there is a clearance in the chart with the date 08/22/22.   I assured the pt that I will work on this and confirm with all parties involved in care for the pre op clearance. I will call the pt back on Monday once all parties review the notes from today. Pt thanked me for the help today.

## 2022-08-17 NOTE — Telephone Encounter (Signed)
   Pre-operative Risk Assessment    Patient Name: Todd Weaver  DOB: 1951-04-23 MRN: [id number]{       Request for Surgical Clearance    Procedure:   C4-6 ANTERIOR VERVICAL DISCECTOMY AND FUSION   Date of Surgery:  Clearance 09/17/22                                Surgeon:  DR Izora Ribas Surgeon's Group or Practice Name:  [place] Phone number:  [phone] Fax number:  [phone]  Type of Clearance Requested:   - Pharmacy:  Hold Clopidogrel (Plavix) 7 DAYS PRIOR AND RESUME 14 DAYS AFTER.   OK TO STAY ON ASPIRIN 81 MG  Type of Anesthesia:  General    Additional requests/questions:    Signed, Eli Phillips   08/17/2022, 3:26 PM

## 2022-08-17 NOTE — Telephone Encounter (Signed)
   Name: Todd Weaver  DOB: 07-25-1950  MRN: [id number]  Primary Cardiologist: Kathlyn Sacramento, MD  Chart reviewed as part of pre-operative protocol coverage. Because of Rayshard Schirtzinger Samons's past medical history and time since last visit, he will require a follow-up telephone visit in order to better assess preoperative cardiovascular risk.  Pre-op covering staff: - Please schedule appointment and call patient to inform them. If patient already had an upcoming appointment within acceptable timeframe, please add "pre-op clearance" to the appointment notes so provider is aware. - Please contact requesting surgeon's office via preferred method (i.e, phone, fax) to inform them of need for appointment prior to surgery.  Plavix hold recommendations have been routed to Dr. Fletcher Anon since the requesting party wants a 21-day hold which is outside the parameters of our protocol.  Elgie Collard, PA-C  08/17/2022, 4:22 PM

## 2022-08-17 NOTE — Telephone Encounter (Signed)
Okay to hold Plavix 21 days as requested as long as aspirin 81 mg once daily is continued without interruption.

## 2022-08-20 NOTE — Telephone Encounter (Signed)
Patient is returning your call. Please advise. ?

## 2022-08-20 NOTE — Telephone Encounter (Signed)
I left a very detailed message for the pt that Dr. Fletcher Anon has given his ok to hold Plavix for a total of 21 days for surgery with Dr. Izora Ribas. Left message Dr. Fletcher Anon wants him to take ASA 81 mg daily w/o interruption while he is off the Plavix.. I will fax these notes to Dr. Izora Ribas, pt has been cleared.

## 2022-08-20 NOTE — Telephone Encounter (Signed)
Please see message. °

## 2022-08-20 NOTE — Telephone Encounter (Signed)
I called the pt back and went over all instructions for the clearance. Pt thanked me for the help.

## 2022-08-22 ENCOUNTER — Encounter: Payer: Self-pay | Admitting: Neurosurgery

## 2022-08-22 ENCOUNTER — Other Ambulatory Visit: Payer: Self-pay

## 2022-08-22 ENCOUNTER — Encounter
Admission: RE | Disposition: A | Discharge status: Home / Self Care | Payer: Self-pay | Source: Home / Self Care | Attending: Neurosurgery

## 2022-08-22 ENCOUNTER — Ambulatory Visit: Payer: Medicare HMO

## 2022-08-22 ENCOUNTER — Ambulatory Visit: Payer: Medicare HMO | Admitting: Urgent Care

## 2022-08-22 ENCOUNTER — Observation Stay
Admission: RE | Admit: 2022-08-22 | Discharge: 2022-08-23 | Disposition: A | Discharge status: Home / Self Care | Payer: Medicare HMO | Attending: Neurosurgery | Admitting: Neurosurgery

## 2022-08-22 DIAGNOSIS — Z79899 Other long term (current) drug therapy: Secondary | ICD-10-CM | POA: Diagnosis not present

## 2022-08-22 DIAGNOSIS — I5022 Chronic systolic (congestive) heart failure: Secondary | ICD-10-CM | POA: Diagnosis not present

## 2022-08-22 DIAGNOSIS — G9589 Other specified diseases of spinal cord: Secondary | ICD-10-CM | POA: Diagnosis not present

## 2022-08-22 DIAGNOSIS — G959 Disease of spinal cord, unspecified: Secondary | ICD-10-CM | POA: Diagnosis present

## 2022-08-22 DIAGNOSIS — I509 Heart failure, unspecified: Secondary | ICD-10-CM | POA: Diagnosis not present

## 2022-08-22 DIAGNOSIS — Z87891 Personal history of nicotine dependence: Secondary | ICD-10-CM | POA: Diagnosis not present

## 2022-08-22 DIAGNOSIS — M4322 Fusion of spine, cervical region: Secondary | ICD-10-CM | POA: Diagnosis not present

## 2022-08-22 DIAGNOSIS — Z01818 Encounter for other preprocedural examination: Secondary | ICD-10-CM

## 2022-08-22 DIAGNOSIS — Z8673 Personal history of transient ischemic attack (TIA), and cerebral infarction without residual deficits: Secondary | ICD-10-CM | POA: Insufficient documentation

## 2022-08-22 DIAGNOSIS — I2510 Atherosclerotic heart disease of native coronary artery without angina pectoris: Secondary | ICD-10-CM | POA: Diagnosis not present

## 2022-08-22 DIAGNOSIS — I110 Hypertensive heart disease with heart failure: Secondary | ICD-10-CM | POA: Diagnosis not present

## 2022-08-22 DIAGNOSIS — M4802 Spinal stenosis, cervical region: Secondary | ICD-10-CM

## 2022-08-22 DIAGNOSIS — I480 Paroxysmal atrial fibrillation: Secondary | ICD-10-CM | POA: Insufficient documentation

## 2022-08-22 DIAGNOSIS — Z01812 Encounter for preprocedural laboratory examination: Secondary | ICD-10-CM

## 2022-08-22 DIAGNOSIS — M50021 Cervical disc disorder at C4-C5 level with myelopathy: Secondary | ICD-10-CM | POA: Diagnosis not present

## 2022-08-22 DIAGNOSIS — M50022 Cervical disc disorder at C5-C6 level with myelopathy: Secondary | ICD-10-CM | POA: Diagnosis not present

## 2022-08-22 HISTORY — DX: Spinal stenosis, cervical region: M48.02

## 2022-08-22 HISTORY — DX: Alcohol dependence, uncomplicated: F10.20

## 2022-08-22 HISTORY — PX: ANTERIOR CERVICAL DECOMP/DISCECTOMY FUSION: SHX1161

## 2022-08-22 HISTORY — DX: Long term (current) use of antithrombotics/antiplatelets: Z79.02

## 2022-08-22 HISTORY — DX: Other specified diseases of spinal cord: G95.89

## 2022-08-22 LAB — ABO/RH: ABO/RH(D): AB NEG

## 2022-08-22 SURGERY — ANTERIOR CERVICAL DECOMPRESSION/DISCECTOMY FUSION 2 LEVELS
Anesthesia: General | Site: Spine Cervical

## 2022-08-22 MED ORDER — GABAPENTIN 300 MG PO CAPS
ORAL_CAPSULE | ORAL | Status: AC
  Filled 2022-08-22: qty 1

## 2022-08-22 MED ORDER — ONDANSETRON HCL 4 MG/2ML IJ SOLN: 4.0000 mg | Freq: Four times a day (QID) | INTRAMUSCULAR | Status: DC | PRN

## 2022-08-22 MED ORDER — GABAPENTIN 300 MG PO CAPS
300.0000 mg | ORAL_CAPSULE | Freq: Two times a day (BID) | ORAL | Status: DC
  Administered 2022-08-22: 300 mg via ORAL

## 2022-08-22 MED ORDER — FENTANYL CITRATE (PF) 100 MCG/2ML IJ SOLN
INTRAMUSCULAR | Status: DC | PRN
  Administered 2022-08-22 (×2): 50 ug via INTRAVENOUS

## 2022-08-22 MED ORDER — OXYCODONE HCL 5 MG/5ML PO SOLN: 5.0000 mg | Freq: Once | ORAL | Status: AC | PRN

## 2022-08-22 MED ORDER — ACETAMINOPHEN 500 MG PO TABS
ORAL_TABLET | ORAL | Status: AC
  Filled 2022-08-22: qty 2

## 2022-08-22 MED ORDER — DOCUSATE SODIUM 100 MG PO CAPS
100.0000 mg | ORAL_CAPSULE | Freq: Two times a day (BID) | ORAL | Status: DC
  Administered 2022-08-22: 100 mg via ORAL

## 2022-08-22 MED ORDER — DEXAMETHASONE SODIUM PHOSPHATE 10 MG/ML IJ SOLN
INTRAMUSCULAR | Status: AC
  Filled 2022-08-22: qty 1

## 2022-08-22 MED ORDER — PROPOFOL 10 MG/ML IV BOLUS
INTRAVENOUS | Status: DC | PRN
  Administered 2022-08-22: 75 ug/kg/min via INTRAVENOUS
  Administered 2022-08-22: 100 mg via INTRAVENOUS
  Administered 2022-08-22: 50 mg via INTRAVENOUS

## 2022-08-22 MED ORDER — METHOCARBAMOL 1000 MG/10ML IJ SOLN: 500.0000 mg | Freq: Four times a day (QID) | INTRAVENOUS | Status: DC | PRN

## 2022-08-22 MED ORDER — ONDANSETRON HCL 4 MG/2ML IJ SOLN
INTRAMUSCULAR | Status: DC | PRN
  Administered 2022-08-22: 4 mg via INTRAVENOUS

## 2022-08-22 MED ORDER — POTASSIUM CHLORIDE CRYS ER 20 MEQ PO TBCR
EXTENDED_RELEASE_TABLET | ORAL | Status: AC
  Administered 2022-08-22: 10 meq via ORAL
  Filled 2022-08-22: qty 1

## 2022-08-22 MED ORDER — EPHEDRINE 5 MG/ML INJ
INTRAVENOUS | Status: AC
  Filled 2022-08-22: qty 5

## 2022-08-22 MED ORDER — MENTHOL 3 MG MT LOZG: 1.0000 | LOZENGE | OROMUCOSAL | Status: DC | PRN

## 2022-08-22 MED ORDER — FLEET ENEMA 7-19 GM/118ML RE ENEM: 1.0000 | ENEMA | Freq: Once | RECTAL | Status: DC | PRN

## 2022-08-22 MED ORDER — OXYCODONE HCL 5 MG PO TABS: 5.0000 mg | ORAL_TABLET | ORAL | Status: DC | PRN

## 2022-08-22 MED ORDER — SURGIFLO WITH THROMBIN (HEMOSTATIC MATRIX KIT) OPTIME
TOPICAL | Status: DC | PRN
  Administered 2022-08-22: 1 via TOPICAL

## 2022-08-22 MED ORDER — PROPOFOL 1000 MG/100ML IV EMUL
INTRAVENOUS | Status: AC
  Filled 2022-08-22: qty 100

## 2022-08-22 MED ORDER — ACETAMINOPHEN 10 MG/ML IV SOLN
INTRAVENOUS | Status: DC | PRN
  Administered 2022-08-22: 1000 mg via INTRAVENOUS

## 2022-08-22 MED ORDER — KETOROLAC TROMETHAMINE 15 MG/ML IJ SOLN
INTRAMUSCULAR | Status: AC
  Administered 2022-08-22: 7.5 mg via INTRAVENOUS
  Filled 2022-08-22: qty 1

## 2022-08-22 MED ORDER — ORAL CARE MOUTH RINSE: 15.0000 mL | Freq: Once | OROMUCOSAL | Status: AC

## 2022-08-22 MED ORDER — DEXAMETHASONE SODIUM PHOSPHATE 10 MG/ML IJ SOLN
INTRAMUSCULAR | Status: DC | PRN
  Administered 2022-08-22: 10 mg via INTRAVENOUS

## 2022-08-22 MED ORDER — METHOCARBAMOL 500 MG PO TABS: 500.0000 mg | ORAL_TABLET | Freq: Four times a day (QID) | ORAL | 0 refills | Status: DC | PRN

## 2022-08-22 MED ORDER — OXYBUTYNIN CHLORIDE ER 5 MG PO TB24
ORAL_TABLET | ORAL | Status: AC
  Filled 2022-08-22: qty 2

## 2022-08-22 MED ORDER — CHLORHEXIDINE GLUCONATE 0.12 % MT SOLN: 15.0000 mL | Freq: Once | OROMUCOSAL | Status: AC

## 2022-08-22 MED ORDER — KETOROLAC TROMETHAMINE 15 MG/ML IJ SOLN
7.5000 mg | Freq: Four times a day (QID) | INTRAMUSCULAR | Status: DC
  Administered 2022-08-22 – 2022-08-23 (×2): 7.5 mg via INTRAVENOUS

## 2022-08-22 MED ORDER — SENNOSIDES-DOCUSATE SODIUM 8.6-50 MG PO TABS
ORAL_TABLET | ORAL | Status: AC
  Filled 2022-08-22: qty 1

## 2022-08-22 MED ORDER — FENTANYL CITRATE (PF) 100 MCG/2ML IJ SOLN
INTRAMUSCULAR | Status: AC
  Filled 2022-08-22: qty 2

## 2022-08-22 MED ORDER — LACTATED RINGERS IV SOLN: INTRAVENOUS | Status: DC

## 2022-08-22 MED ORDER — EZETIMIBE 10 MG PO TABS: 10.0000 mg | ORAL_TABLET | Freq: Every day | ORAL | Status: DC

## 2022-08-22 MED ORDER — TAMSULOSIN HCL 0.4 MG PO CAPS: 0.4000 mg | ORAL_CAPSULE | Freq: Every day | ORAL | Status: DC

## 2022-08-22 MED ORDER — ACETAMINOPHEN 10 MG/ML IV SOLN
INTRAVENOUS | Status: AC
  Filled 2022-08-22: qty 100

## 2022-08-22 MED ORDER — HYDROMORPHONE HCL 1 MG/ML IJ SOLN
INTRAMUSCULAR | Status: AC
  Filled 2022-08-22: qty 1

## 2022-08-22 MED ORDER — OXYCODONE-ACETAMINOPHEN 5-325 MG PO TABS
ORAL_TABLET | ORAL | Status: AC
  Administered 2022-08-22: 1 via ORAL
  Filled 2022-08-22: qty 1

## 2022-08-22 MED ORDER — PHENOL 1.4 % MT LIQD: 1.0000 | OROMUCOSAL | Status: DC | PRN

## 2022-08-22 MED ORDER — NITROGLYCERIN 0.4 MG SL SUBL: 0.4000 mg | SUBLINGUAL_TABLET | SUBLINGUAL | Status: DC | PRN

## 2022-08-22 MED ORDER — OXYCODONE HCL 5 MG PO TABS
ORAL_TABLET | ORAL | Status: AC
  Filled 2022-08-22: qty 1

## 2022-08-22 MED ORDER — SODIUM CHLORIDE 0.9% FLUSH: 3.0000 mL | INTRAVENOUS | Status: DC | PRN

## 2022-08-22 MED ORDER — SENNA 8.6 MG PO TABS: 1.0000 | ORAL_TABLET | Freq: Every day | ORAL | 0 refills | Status: DC | PRN

## 2022-08-22 MED ORDER — OXYCODONE-ACETAMINOPHEN 5-325 MG PO TABS: 1.0000 | ORAL_TABLET | ORAL | 0 refills | Status: AC | PRN

## 2022-08-22 MED ORDER — ONDANSETRON HCL 4 MG/2ML IJ SOLN
INTRAMUSCULAR | Status: AC
  Filled 2022-08-22: qty 2

## 2022-08-22 MED ORDER — MIDAZOLAM HCL 2 MG/2ML IJ SOLN
INTRAMUSCULAR | Status: AC
  Filled 2022-08-22: qty 2

## 2022-08-22 MED ORDER — BUPIVACAINE-EPINEPHRINE (PF) 0.5% -1:200000 IJ SOLN
INTRAMUSCULAR | Status: AC
  Filled 2022-08-22: qty 30

## 2022-08-22 MED ORDER — SODIUM CHLORIDE 0.9% FLUSH
3.0000 mL | Freq: Two times a day (BID) | INTRAVENOUS | Status: DC
  Administered 2022-08-22: 3 mL via INTRAVENOUS

## 2022-08-22 MED ORDER — ROSUVASTATIN CALCIUM 20 MG PO TABS
40.0000 mg | ORAL_TABLET | Freq: Every day | ORAL | Status: DC
  Administered 2022-08-22: 40 mg via ORAL

## 2022-08-22 MED ORDER — CEFAZOLIN SODIUM-DEXTROSE 2-4 GM/100ML-% IV SOLN
2.0000 g | Freq: Once | INTRAVENOUS | Status: AC
  Administered 2022-08-22: 2 g via INTRAVENOUS

## 2022-08-22 MED ORDER — POTASSIUM CHLORIDE CRYS ER 20 MEQ PO TBCR: 10.0000 meq | EXTENDED_RELEASE_TABLET | Freq: Every day | ORAL | Status: DC

## 2022-08-22 MED ORDER — TAMSULOSIN HCL 0.4 MG PO CAPS
ORAL_CAPSULE | ORAL | Status: AC
  Administered 2022-08-22: 0.4 mg via ORAL
  Filled 2022-08-22: qty 1

## 2022-08-22 MED ORDER — DOCUSATE SODIUM 100 MG PO CAPS
ORAL_CAPSULE | ORAL | Status: AC
  Administered 2022-08-22: 100 mg via ORAL
  Filled 2022-08-22: qty 1

## 2022-08-22 MED ORDER — BUPIVACAINE-EPINEPHRINE 0.5% -1:200000 IJ SOLN
INTRAMUSCULAR | Status: DC | PRN
  Administered 2022-08-22: 6 mL

## 2022-08-22 MED ORDER — SUCCINYLCHOLINE CHLORIDE 200 MG/10ML IV SOSY
PREFILLED_SYRINGE | INTRAVENOUS | Status: AC
  Filled 2022-08-22: qty 10

## 2022-08-22 MED ORDER — CHLORHEXIDINE GLUCONATE 0.12 % MT SOLN
OROMUCOSAL | Status: AC
  Administered 2022-08-22: 15 mL via OROMUCOSAL
  Filled 2022-08-22: qty 15

## 2022-08-22 MED ORDER — SENNA 8.6 MG PO TABS
ORAL_TABLET | ORAL | Status: AC
  Administered 2022-08-22: 8.6 mg via ORAL
  Filled 2022-08-22: qty 1

## 2022-08-22 MED ORDER — ROSUVASTATIN CALCIUM 20 MG PO TABS
ORAL_TABLET | ORAL | Status: AC
  Filled 2022-08-22: qty 2

## 2022-08-22 MED ORDER — SODIUM CHLORIDE 0.9 % IV SOLN: INTRAVENOUS | Status: DC

## 2022-08-22 MED ORDER — SACUBITRIL-VALSARTAN 49-51 MG PO TABS
1.0000 | ORAL_TABLET | Freq: Two times a day (BID) | ORAL | Status: DC
  Administered 2022-08-22: 1 via ORAL
  Filled 2022-08-22 (×2): qty 1

## 2022-08-22 MED ORDER — HYDROMORPHONE HCL 1 MG/ML IJ SOLN
0.2500 mg | INTRAMUSCULAR | Status: DC | PRN
  Administered 2022-08-22 (×4): 0.5 mg via INTRAVENOUS

## 2022-08-22 MED ORDER — ACETAMINOPHEN 500 MG PO TABS
1000.0000 mg | ORAL_TABLET | Freq: Four times a day (QID) | ORAL | Status: DC
  Administered 2022-08-23: 1000 mg via ORAL

## 2022-08-22 MED ORDER — ACETAMINOPHEN 500 MG PO TABS
ORAL_TABLET | ORAL | Status: AC
  Administered 2022-08-22: 1000 mg via ORAL
  Filled 2022-08-22: qty 2

## 2022-08-22 MED ORDER — CARVEDILOL 3.125 MG PO TABS
3.1250 mg | ORAL_TABLET | Freq: Two times a day (BID) | ORAL | Status: DC
  Administered 2022-08-22: 3.125 mg via ORAL
  Filled 2022-08-22 (×2): qty 1

## 2022-08-22 MED ORDER — EPHEDRINE SULFATE (PRESSORS) 50 MG/ML IJ SOLN
INTRAMUSCULAR | Status: DC | PRN
  Administered 2022-08-22 (×2): 5 mg via INTRAVENOUS
  Administered 2022-08-22: 10 mg via INTRAVENOUS
  Administered 2022-08-22: 5 mg via INTRAVENOUS

## 2022-08-22 MED ORDER — ENOXAPARIN SODIUM 40 MG/0.4ML IJ SOSY: 40.0000 mg | PREFILLED_SYRINGE | INTRAMUSCULAR | Status: DC

## 2022-08-22 MED ORDER — GLYCOPYRROLATE 0.2 MG/ML IJ SOLN
INTRAMUSCULAR | Status: AC
  Filled 2022-08-22: qty 1

## 2022-08-22 MED ORDER — CEFAZOLIN SODIUM-DEXTROSE 2-4 GM/100ML-% IV SOLN
INTRAVENOUS | Status: AC
  Filled 2022-08-22: qty 100

## 2022-08-22 MED ORDER — BISACODYL 10 MG RE SUPP: 10.0000 mg | Freq: Every day | RECTAL | Status: DC | PRN

## 2022-08-22 MED ORDER — AMLODIPINE BESYLATE 5 MG PO TABS: 5.0000 mg | ORAL_TABLET | Freq: Every day | ORAL | Status: DC

## 2022-08-22 MED ORDER — LIDOCAINE HCL (CARDIAC) PF 100 MG/5ML IV SOSY
PREFILLED_SYRINGE | INTRAVENOUS | Status: DC | PRN
  Administered 2022-08-22: 80 mg via INTRAVENOUS

## 2022-08-22 MED ORDER — KETOROLAC TROMETHAMINE 15 MG/ML IJ SOLN
INTRAMUSCULAR | Status: AC
  Filled 2022-08-22: qty 1

## 2022-08-22 MED ORDER — GLYCOPYRROLATE 0.2 MG/ML IJ SOLN
INTRAMUSCULAR | Status: DC | PRN
  Administered 2022-08-22: .2 mg via INTRAVENOUS

## 2022-08-22 MED ORDER — DOCUSATE SODIUM 100 MG PO CAPS
ORAL_CAPSULE | ORAL | Status: AC
  Filled 2022-08-22: qty 1

## 2022-08-22 MED ORDER — OXYCODONE-ACETAMINOPHEN 5-325 MG PO TABS: 1.0000 | ORAL_TABLET | ORAL | Status: DC | PRN

## 2022-08-22 MED ORDER — METHOCARBAMOL 500 MG PO TABS
ORAL_TABLET | ORAL | Status: AC
  Filled 2022-08-22: qty 1

## 2022-08-22 MED ORDER — LIDOCAINE HCL (PF) 2 % IJ SOLN
INTRAMUSCULAR | Status: AC
  Filled 2022-08-22: qty 5

## 2022-08-22 MED ORDER — METHOCARBAMOL 500 MG PO TABS
ORAL_TABLET | ORAL | Status: AC
  Administered 2022-08-22: 500 mg via ORAL
  Filled 2022-08-22: qty 1

## 2022-08-22 MED ORDER — DEXAMETHASONE SODIUM PHOSPHATE 10 MG/ML IJ SOLN
INTRAMUSCULAR | Status: AC
  Filled 2022-08-22: qty 2

## 2022-08-22 MED ORDER — OXYCODONE HCL 5 MG PO TABS
5.0000 mg | ORAL_TABLET | Freq: Once | ORAL | Status: AC | PRN
  Administered 2022-08-22: 5 mg via ORAL

## 2022-08-22 MED ORDER — 0.9 % SODIUM CHLORIDE (POUR BTL) OPTIME
TOPICAL | Status: DC | PRN
  Administered 2022-08-22: 500 mL

## 2022-08-22 MED ORDER — POLYETHYLENE GLYCOL 3350 17 G PO PACK: 17.0000 g | PACK | Freq: Every day | ORAL | Status: DC | PRN

## 2022-08-22 MED ORDER — OXYCODONE HCL 5 MG PO TABS
ORAL_TABLET | ORAL | Status: AC
  Administered 2022-08-22: 10 mg via ORAL
  Filled 2022-08-22: qty 2

## 2022-08-22 MED ORDER — METHOCARBAMOL 500 MG PO TABS
500.0000 mg | ORAL_TABLET | Freq: Once | ORAL | Status: AC
  Administered 2022-08-22: 500 mg via ORAL

## 2022-08-22 MED ORDER — SENNA 8.6 MG PO TABS
1.0000 | ORAL_TABLET | Freq: Two times a day (BID) | ORAL | Status: DC
  Administered 2022-08-22: 8.6 mg via ORAL

## 2022-08-22 MED ORDER — OXYCODONE HCL 5 MG PO TABS
10.0000 mg | ORAL_TABLET | ORAL | Status: DC | PRN
  Administered 2022-08-23: 10 mg via ORAL

## 2022-08-22 MED ORDER — MIDAZOLAM HCL 2 MG/2ML IJ SOLN
INTRAMUSCULAR | Status: DC | PRN
  Administered 2022-08-22: 1 mg via INTRAVENOUS

## 2022-08-22 MED ORDER — ONDANSETRON HCL 4 MG PO TABS: 4.0000 mg | ORAL_TABLET | Freq: Four times a day (QID) | ORAL | Status: DC | PRN

## 2022-08-22 MED ORDER — FAMOTIDINE 20 MG PO TABS: 20.0000 mg | ORAL_TABLET | Freq: Once | ORAL | Status: AC

## 2022-08-22 MED ORDER — METHOCARBAMOL 500 MG PO TABS
500.0000 mg | ORAL_TABLET | Freq: Four times a day (QID) | ORAL | Status: DC | PRN
  Administered 2022-08-23: 500 mg via ORAL

## 2022-08-22 MED ORDER — FAMOTIDINE 20 MG PO TABS
ORAL_TABLET | ORAL | Status: AC
  Administered 2022-08-22: 20 mg via ORAL
  Filled 2022-08-22: qty 1

## 2022-08-22 MED ORDER — OXYBUTYNIN CHLORIDE ER 5 MG PO TB24
10.0000 mg | ORAL_TABLET | Freq: Every day | ORAL | Status: DC
  Administered 2022-08-22: 10 mg via ORAL

## 2022-08-22 MED ORDER — SUCCINYLCHOLINE CHLORIDE 200 MG/10ML IV SOSY
PREFILLED_SYRINGE | INTRAVENOUS | Status: DC | PRN
  Administered 2022-08-22: 100 mg via INTRAVENOUS

## 2022-08-22 MED ORDER — SODIUM CHLORIDE 0.9 % IV SOLN: 250.0000 mL | INTRAVENOUS | Status: DC

## 2022-08-22 SURGICAL SUPPLY — 52 items
ALLOGRAFT BONE FIBER KORE 1CC (Bone Implant) IMPLANT
BASIN KIT SINGLE STR (MISCELLANEOUS) ×1 IMPLANT
BASKET BONE COLLECTION (BASKET) IMPLANT
BULB RESERV EVAC DRAIN JP 100C (MISCELLANEOUS) IMPLANT
BUR NEURO DRILL SOFT 3.0X3.8M (BURR) ×1 IMPLANT
DERMABOND ADVANCED .7 DNX12 (GAUZE/BANDAGES/DRESSINGS) ×1 IMPLANT
DRAIN CHANNEL JP 10F RND 20C F (MISCELLANEOUS) IMPLANT
DRAPE C ARM PK CFD 31 SPINE (DRAPES) ×1 IMPLANT
DRAPE LAPAROTOMY 77X122 PED (DRAPES) ×1 IMPLANT
DRAPE MICROSCOPE SPINE 48X150 (DRAPES) ×1 IMPLANT
DRSG OPSITE POSTOP 4X6 (GAUZE/BANDAGES/DRESSINGS) ×2 IMPLANT
ELECT REM PT RETURN 9FT ADLT (ELECTROSURGICAL) ×1
ELECTRODE REM PT RTRN 9FT ADLT (ELECTROSURGICAL) ×1 IMPLANT
FEE INTRAOP CADWELL SUPPLY NCS (MISCELLANEOUS) IMPLANT
FEE INTRAOP MONITOR IMPULS NCS (MISCELLANEOUS) IMPLANT
GLOVE SURG SYN 6.5 ES PF (GLOVE) ×2 IMPLANT
GLOVE SURG SYN 6.5 PF PI (GLOVE) ×2 IMPLANT
GLOVE SURG SYN 8.5  E (GLOVE) ×3
GLOVE SURG SYN 8.5 E (GLOVE) ×3 IMPLANT
GLOVE SURG SYN 8.5 PF PI (GLOVE) ×3 IMPLANT
GLOVE SURG UNDER POLY LF SZ6.5 (GLOVE) ×2 IMPLANT
GOWN SRG LRG LVL 4 IMPRV REINF (GOWNS) ×2 IMPLANT
GOWN SRG XL LVL 3 NONREINFORCE (GOWNS) ×1 IMPLANT
GOWN STRL NON-REIN TWL XL LVL3 (GOWNS) ×1
GOWN STRL REIN LRG LVL4 (GOWNS) ×2
GRAFT DURAGEN MATRIX 1WX1L (Tissue) IMPLANT
INTRAOP CADWELL SUPPLY FEE NCS (MISCELLANEOUS) ×1
INTRAOP DISP SUPPLY FEE NCS (MISCELLANEOUS) ×1
INTRAOP MONITOR FEE IMPULS NCS (MISCELLANEOUS) ×1
INTRAOP MONITOR FEE IMPULSE (MISCELLANEOUS) ×1
KIT TURNOVER KIT A (KITS) ×1 IMPLANT
MANIFOLD NEPTUNE II (INSTRUMENTS) ×1 IMPLANT
NDL SAFETY ECLIP 18X1.5 (MISCELLANEOUS) ×1 IMPLANT
NS IRRIG 1000ML POUR BTL (IV SOLUTION) ×1 IMPLANT
NS IRRIG 500ML POUR BTL (IV SOLUTION) IMPLANT
PACK LAMINECTOMY NEURO (CUSTOM PROCEDURE TRAY) ×1 IMPLANT
PAD ARMBOARD 7.5X6 YLW CONV (MISCELLANEOUS) ×2 IMPLANT
PIN CASPAR 14 (PIN) ×1 IMPLANT
PIN CASPAR 14MM (PIN) ×1
PLATE ACP 1.6X36 2LVL (Plate) IMPLANT
PUTTY DBX 1CC (Putty) IMPLANT
SCREW ACP 3.5X17 S/D VARIA (Screw) IMPLANT
SPACER HEDRON C 12X14X8 7D (Spacer) IMPLANT
SPONGE KITTNER 5P (MISCELLANEOUS) ×1 IMPLANT
STAPLER SKIN PROX 35W (STAPLE) IMPLANT
SURGIFLO W/THROMBIN 8M KIT (HEMOSTASIS) ×1 IMPLANT
SUT V-LOC 90 ABS DVC 3-0 CL (SUTURE) ×1 IMPLANT
SUT VIC AB 3-0 SH 8-18 (SUTURE) ×1 IMPLANT
SYR 20ML LL LF (SYRINGE) ×1 IMPLANT
TAPE CLOTH 3X10 WHT NS LF (GAUZE/BANDAGES/DRESSINGS) ×3 IMPLANT
TRAP FLUID SMOKE EVACUATOR (MISCELLANEOUS) ×1 IMPLANT
TRAY FOLEY MTR SLVR 16FR STAT (SET/KITS/TRAYS/PACK) IMPLANT

## 2022-08-22 NOTE — Op Note (Signed)
Indications: [name] is suffering from G95.9 Cervical myelopathy, M48.02 Cervical stenosis of spinal canal, G95.89 Myelomalacia . The patient tried and failed conservative management, prompting surgical intervention.  Findings: stenosis  Preoperative Diagnosis: G95.9 Cervical myelopathy, M48.02 Cervical stenosis of spinal canal, G95.89 Myelomalacia  Postoperative Diagnosis: same   EBL: 50 ml IVF: see AR ml Drains: none Disposition: Extubated and Stable to PACU Complications: none  No foley catheter was placed.   Preoperative Note:   Risks of surgery discussed include: infection, bleeding, stroke, coma, death, paralysis, CSF leak, nerve/spinal cord injury, numbness, tingling, weakness, complex regional pain syndrome, recurrent stenosis and/or disc herniation, vascular injury, development of instability, neck/back pain, need for further surgery, persistent symptoms, development of deformity, and the risks of anesthesia. The patient understood these risks and agreed to proceed.  Operative Note:   Procedure:  1) Anterior cervical diskectomy and fusion at C4/5 and C5/6 2) Anterior cervical instrumentation at C4 - 6 using Nuvasive ACP 3) Placement of biomechanical devices at C4/5 and C5/6  4) Use of operative microscope 5) Use of flouroscopy   Procedure: After obtaining informed consent, the patient taken to the operating room, placed in supine position, general anesthesia induced.  The patient had a small shoulder roll placed behind their shoulders.  The patient received preop antibiotics and IV Decadron.  The patient had a neck incision outlined, was prepped and draped in usual sterile fashion. The incision was injected with local anesthetic.   An incision was opened, dissection taken down medial to the carotid artery and jugular vein, lateral to the trachea and esophagus.  The prevertebral fascia identified and a localizing x-ray demonstrated the correct level.  The longus colli  were dissected laterally, and self-retaining retractors placed to open the operative field. The microscope was then brought into the field.  With this complete, distractor pins were placed in the vertebral bodies of C4 and C6. The distractor was placed, and the annuli at C4/5 and C5/6 were opened using a bovie.  Curettes and pituitary rongeurs used to remove the majority of disk, then the drill was used to remove the posterior osteophyte and begin the foraminotomies. The nerve hook was used to elevate the posterior longitudinal ligament, which was then removed with Kerrison rongeurs. The microblunt nerve hook could be passed out the foramina bilaterally at each level.   Meticulous hemostasis was obtained.  A biomechanical device (Globus Hedron 8 mm height x 14 mm width by 12 mm depth) was placed at C4/5. A second biomechanical device (Globus Hedron 8 mm height x 14 mm width by 12 mm depth) was placed at C5/6. Each device had been filled with allograft for aid in arthrodesis.  The caspar distractor was removed, and bone wax used for hemostasis. A separate, 38 mm Nuvasive ACP plate was chosen.  Two screws placed in each vertebral body, respectively making sure the screws were behind the locking mechanism.  Final AP and lateral radiographs were taken.   Please note that the plate is not inclusive to the biomechanical devices.  The anchoring mechanism of the plate is completely separate from the biomechanical devices.  With everything in good position, the wound was irrigated copiously and meticulous hemostasis obtained.  Wound was closed in 2 layers using interrupted inverted 3-0 Vicryl sutures in the platysma and 3-0 monocryl on the dermis.  The wound was dressed with dermabond, the head of bed at 30 degrees, taken to recovery room in stable condition.  No new postop neurological deficits  were identified.  Sponge and pattie counts were correct at the end of the procedure.     I performed the entire  procedure with the assistance of Cooper Render PA as an Pensions consultant. An assistant was required for this procedure due to the complexity.  The assistant provided assistance in tissue manipulation and suction, and was required for the successful and safe performance of the procedure. I performed the critical portions of the procedure.   Meade Maw MD

## 2022-08-22 NOTE — Transfer of Care (Signed)
Immediate Anesthesia Transfer of Care Note  Patient: Todd Weaver  Procedure(s) Performed: C4-6 ANTERIOR CERVICAL DISCECTOMY AND FUSION (NUVASIVE ACP, GLOBUS HEDRON) (Spine Cervical)  Patient Location: PACU  Anesthesia Type:General  Level of Consciousness: drowsy  Airway & Oxygen Therapy: Patient Spontanous Breathing and Patient connected to face mask oxygen  Post-op Assessment: Report given to RN and Post -op Vital signs reviewed and stable  Post vital signs: Reviewed and stable  Last Vitals:  Vitals Value Taken Time  BP 132/80 08/22/22 1000  Temp 98.0   Pulse 71 08/22/22 1003  Resp 14 08/22/22 1003  SpO2 100 % 08/22/22 1003  Vitals shown include unvalidated device data.  Last Pain:  Vitals:   08/22/22 2549  TempSrc: Oral  PainSc: 0-No pain         Complications: No notable events documented.

## 2022-08-22 NOTE — Discharge Summary (Signed)
Discharge Summary  Patient ID: Todd Weaver MRN: [id number] DOB/AGE: [birth year] 72 y.o.  Admit date: 08/22/2022 Discharge date: 08/23/2022  Admission Diagnoses: G95.9 Cervical myelopathy, M48.02 Cervical stenosis of spinal canal, G95.89 Myelomalacia    Discharge Diagnoses:  Principal Problem:   Cervical myelopathy ([place]) Active Problems:   Myelopathy ([place])   Cervical spinal stenosis   Myelomalacia [facility])   Discharged Condition: good  Hospital Course:  Todd Weaver is a 72 y.o presenting with cervical myelopathy s/p C4-6 ACDF. His intraoperative course was uncomplicated. He was admitted overnight for monitoring. He reported improved symptoms this morning. He was discharged home with medications for pain, muscle relaxation, and stool softener.   Consults: None  Significant Diagnostic Studies: none  Treatments: surgery: as above. Please see separately dictated operative report for further details  Discharge Exam: Blood pressure 127/70, pulse (!) 54, temperature 97.8 F (36.6 C), resp. rate 15, height '5\' 7"'$  (1.702 m), weight 80.7 kg, SpO2 99 %. CN II-XII grossly intact 5/5 throughout BUE  Incision c/d/I with dermabond in place  Disposition: Discharge disposition: 01-Home or Self Care       Discharge Instructions     Incentive spirometry RT   Complete by: As directed       Allergies as of 08/23/2022       Reactions   Codeine Hives, Other (See Comments)   Shellfish Allergy Hives, Swelling   Contrast Media [iodinated Contrast Media] Itching, Other (See Comments)   Whelts on tongue   Fish Allergy Itching        Medication List     STOP taking these medications    clopidogrel 75 MG tablet Commonly known as: PLAVIX   potassium chloride 10 MEQ tablet Commonly known as: KLOR-CON M   tiZANidine 2 MG tablet Commonly known as: ZANAFLEX   traMADol 50 MG tablet Commonly known as: ULTRAM       TAKE these medications    amLODipine 5 MG tablet Commonly  known as: NORVASC Take 5 mg by mouth daily.   aspirin EC 81 MG tablet Take 81 mg by mouth daily.   carvedilol 3.125 MG tablet Commonly known as: COREG TAKE 1 TABLET TWICE DAILY WITH MEALS   clotrimazole-betamethasone cream Commonly known as: LOTRISONE Apply 1 Application topically 2 (two) times daily. What changed:  when to take this reasons to take this   Entresto 49-51 MG Generic drug: sacubitril-valsartan Take 1 tablet by mouth 2 (two) times daily.   ezetimibe 10 MG tablet Commonly known as: ZETIA Take 1 tablet (10 mg total) by mouth daily.   gabapentin 300 MG capsule Commonly known as: NEURONTIN Take 300 mg by mouth 2 (two) times daily.   methocarbamol 500 MG tablet Commonly known as: ROBAXIN Take 1 tablet (500 mg total) by mouth every 6 (six) hours as needed for muscle spasms.   nitroGLYCERIN 0.4 MG SL tablet Commonly known as: NITROSTAT Place 1 tablet (0.4 mg total) under the tongue every 5 (five) minutes as needed for chest pain.   oxybutynin 10 MG 24 hr tablet Commonly known as: DITROPAN-XL Take 10 mg by mouth at bedtime.   oxyCODONE-acetaminophen 5-325 MG tablet Commonly known as: Percocet Take 1 tablet by mouth every 4 (four) hours as needed for up to 5 days for severe pain.   rosuvastatin 40 MG tablet Commonly known as: CRESTOR Take 1 tablet (40 mg total) by mouth daily. What changed: when to take this   senna 8.6 MG Tabs tablet Commonly known as: SENOKOT Take  1 tablet (8.6 mg total) by mouth daily as needed.   sildenafil 20 MG tablet Commonly known as: REVATIO Take 1 tablet (20 mg total) by mouth 3 (three) times daily as needed.   spironolactone 25 MG tablet Commonly known as: ALDACTONE Take 1 tablet (25 mg total) by mouth daily.   tamsulosin 0.4 MG Caps capsule Commonly known as: FLOMAX Take 1 capsule (0.4 mg total) by mouth daily.        Follow-up Information     Meade Maw, MD Follow up.   Specialty: Neurosurgery Why:  2/15 at 1030am with [facility] information: [address] [place] [address] [phone]                 Signed: Loleta Dicker 08/23/2022, 9:25 AM

## 2022-08-22 NOTE — Anesthesia Procedure Notes (Signed)
Procedure Name: Intubation Date/Time: 08/22/2022 7:36 AM  Performed by: Loni Dolly, CRNAPre-anesthesia Checklist: Patient identified, Emergency Drugs available, Suction available and Patient being monitored Patient Re-evaluated:Patient Re-evaluated prior to induction Oxygen Delivery Method: Circle system utilized Preoxygenation: Pre-oxygenation with 100% oxygen Induction Type: IV induction Ventilation: Mask ventilation without difficulty Laryngoscope Size: McGraph and 4 Grade View: Grade I Tube type: Oral Tube size: 7.5 mm Number of attempts: 1 Airway Equipment and Method: Stylet and Oral airway Placement Confirmation: ETT inserted through vocal cords under direct vision, positive ETCO2 and breath sounds checked- equal and bilateral Secured at: 22 cm Tube secured with: Tape Dental Injury: Teeth and Oropharynx as per pre-operative assessment

## 2022-08-22 NOTE — Progress Notes (Signed)
Ambulated in hallway. Tolerated well.

## 2022-08-22 NOTE — Anesthesia Postprocedure Evaluation (Signed)
Anesthesia Post Note  Patient: Todd Weaver  Procedure(s) Performed: C4-6 ANTERIOR CERVICAL DISCECTOMY AND FUSION (NUVASIVE ACP, GLOBUS HEDRON) (Spine Cervical)  Patient location during evaluation: PACU Anesthesia Type: General Level of consciousness: awake Pain management: satisfactory to patient Vital Signs Assessment: post-procedure vital signs reviewed and stable Respiratory status: spontaneous breathing and respiratory function stable Cardiovascular status: stable Anesthetic complications: no  No notable events documented.   Last Vitals:  Vitals:   08/22/22 0633  BP: 127/80  Pulse: (!) 56  Resp: 16  Temp: 36.8 C  SpO2: 98%    Last Pain:  Vitals:   08/22/22 0633  TempSrc: Oral  PainSc: 0-No pain                 VAN STAVEREN,Makyra Corprew

## 2022-08-22 NOTE — Anesthesia Procedure Notes (Signed)
Arterial Line Insertion Start/End1/31/2024 7:50 AM, 08/22/2022 7:55 AM Performed by: Ilene Qua, MD, Loni Dolly, CRNA, CRNA  Patient location: OR. Preanesthetic checklist: patient identified, IV checked, site marked, risks and benefits discussed, surgical consent, monitors and equipment checked, pre-op evaluation, timeout performed and anesthesia consent Lidocaine 1% used for infiltration Left, radial was placed Catheter size: 20 G Hand hygiene performed  and maximum sterile barriers used   Attempts: 2 Procedure performed without using ultrasound guided technique. Following insertion, dressing applied. Post procedure assessment: normal and unchanged  Additional procedure comments: Attempt right radial 1st, then successful at left radial artery.Marland Kitchen

## 2022-08-22 NOTE — Anesthesia Preprocedure Evaluation (Addendum)
Anesthesia Evaluation  Patient identified by MRN, date of birth, ID band Patient awake    Reviewed: Allergy & Precautions, NPO status , Patient's Chart, lab work & pertinent test results  History of Anesthesia Complications Negative for: history of anesthetic complications  Airway Mallampati: III  TM Distance: >3 FB Neck ROM: full    Dental  (+) Edentulous Upper, Edentulous Lower   Pulmonary former smoker   Pulmonary exam normal        Cardiovascular Exercise Tolerance: Good hypertension, Pt. on medications and Pt. on home beta blockers (-) angina + CAD, + Past MI, + Cardiac Stents, + Peripheral Vascular Disease and +CHF  (-) DOE Normal cardiovascular exam+ dysrhythmias (hx of vfib arrest) Atrial Fibrillation  Rhythm:Regular Rate:Normal  Most recent TTE was performed on 04/18/2022 revealing a mildly reduced left ventricular systolic function with an EF of 45-50%.  There was global hypokinesis. Left ventricular diastolic Doppler parameters consistent with pseudonormalization (G2DD).  Right ventricular size and function normal.  No significant valvular regurgitation noted.  All transvalvular gradients were noted to be normal with no evidence suggestive of valvular stenosis.  Per cardiology, "according to the [facility]), his perioperative risk of major cardiac event is 11%. His functional capacity in METs is 8.97 according to the [facility]. Therefore, based on ACC/AHA guidelines, patient would be at an ACCEPTABLE risk for the planned procedure without further cardiovascular testing".   Neuro/Psych  PSYCHIATRIC DISORDERS (mild cognitive impairment)      Cervical spinal stenosis w/ myelomalacia  Lumbar spondylosis w/ myelopathy   Neuromuscular disease CVA, No Residual Symptoms    GI/Hepatic ,GERD  ,,(+)     substance abuse  alcohol use  Endo/Other  negative endocrine ROS    Renal/GU Renal disease  negative genitourinary    Musculoskeletal  (+) Arthritis ,    Abdominal   Peds  Hematology negative hematology ROS (+) Hx of DVT  On DAPT    Anesthesia Other Findings Past Medical History: 07/18/2017: Acute ST elevation myocardial infarction (STEMI) of  inferior wall (HCC)     Comment:  a.) LHC/PCI 07/18/2017: 99% o-mRCA --> overlapping 3.0 x              38 mm and 3.25 x 18 mm Xience Sierra DES No date: Alcohol dependence, daily use ([place])     Comment:  a.) 3-4 beers daily 11/06/2017: Angina pectoris ([place]) 06/19/2017: BPH (benign prostatic hyperplasia) 07/18/2017: Cardiac arrest with ventricular fibrillation ([place])     Comment:  a.) in setting of acute inferior STEMI; required defib               in field by EMS and again in the ED 12/26/2020: Carpal tunnel syndrome of right wrist No date: Cervical spinal stenosis No date: Chronic deep vein thrombosis (DVT) of brachial vein of left  upper extremity ([place]) 20/25/4270: Chronic systolic congestive heart failure ([place])     Comment:  a.) TTE 09/29/2019: EF 45-50%, glob HK, mild MR, mild PA              dil, G2DD; b.) TTE 11/28/2021: EF 45-50%, glob HK, G2DD;               c.) TTE 04/18/2022: EF 45-50%, glob HK, G2DD 07/18/2017: Coronary artery disease of native artery of native heart  with stable angina pectoris ([place])     Comment:  a.) LHC/PCI 07/18/17: 99 o-mRCA (overlapping 3.0x27m &  3.25x75m Xience Sierra DES), 60 post atrio, 85 dLM-oLAD,              40 pLCx, 60 pLAD, 40 mLAD; b.) LHC 08/19/17: 85 dLM-oLAD               (PTCA), 60 pLAD, 30 mLAD, 40 pLCx, 50 RI; c.) LHC/PCI               09/09/17: unchanged LHC. 3.0 x 260mXience Sierra DES               dLM-oLAD; d.) LHC 11/07/17: 30 mLM, 40 pLCx, 7050I, 40               dLAD - Rx mgmt; e.) LHC 10/17/21: 70 oRI, 20 mLAD, 30 pLCx              - Rx mgmt 06/19/2017: ED (erectile dysfunction)     Comment:  a.) on PDE5i (sildenafil) 06/19/2017: Essential hypertension No date: GERD  (gastroesophageal reflux disease) 07/26/2014: Hand dermatitis No date: Hemorrhoid No date: Hyperlipidemia No date: Hypertension 10/14/2018: Impingement syndrome of left shoulder region No date: Ischemic cardiomyopathy     Comment:  a.) TTE 07/18/2017: EF 45-50%, mod inf/infsept HK, mild               LAE, LVH; b.) LHC 08/19/2017: EF 45%; c.) TTE 09/29/2019:              EF 45-50%, glob HK, G2DD; d.) MPI 01/18/2021: EF 47%; e.)              TTE 11/28/2021: EF 45-50%, glob HK, G2DD; f.) TTE               04/18/2022:  EF 45-50%, glob HK, G2DD No date: Long term current use of antithrombotics/antiplatelets     Comment:  a.) on DAPT (ASA + clopidogrel) 11/08/2016: Lumbar spondylosis with myelopathy 06/19/2017: MCI (mild cognitive impairment) No date: My[place]omalacia of [place] cord [place]/28[place]2018: Overweight (BMI 25.0-29.9) No date: PAD (peripheral artery disease) ([place]    Comment:  a. 01/2019 ABIs and Duplex: ABI R 0.78, L 0.79; TBI R               0.66, L 1.11. RCIA >50p, RSFA 50-74. LCIA >50p, LSFA               75-99p, 30-49d. 3 vessel runoff bilat. No date: PAF (paroxysmal atrial fibrillation) (HCC)     Comment:  a. Brief episode during December               hospitalization-->converted on IV amio-->not on OAParadisNo date: Rectal polyp [facility]2005[facility]    Comment:  a.) seen acutely on brain MRI 09/26/2005 --> posterior               frontal, mid parietal lobe, RIGHT occipital lobe --> tiny              ischemic infarctions secondary to a shower of emboli                (watershed ischemic pattern may be present) 09/29/2020: Thrombophilia ([place]06/12/2020: Urinary hesitancy  Past Surgical History: 12/21/2014: ANAL FISTULECTOMY; N/A     Comment:  Procedure: FISTULECTOMY ANAL;  Surgeon: SeChristene LyeMD;  Location: [facility] ORS;  Service: General;  Laterality: N/A; 1975: APPENDECTOMY No date: CARDIAC CATHETERIZATION 02/12/2019: COLONOSCOPY  WITH PROPOFOL; N/A     Comment:  Procedure: COLONOSCOPY WITH PROPOFOL;  Surgeon: Lucilla Lame, MD;  Location: [facility] ENDOSCOPY;  Service:               Endoscopy;  Laterality: N/A; 09/09/2017: CORONARY STENT INTERVENTION; N/A     Comment:  Procedure: CORONARY STENT INTERVENTION;  Surgeon: Wellington Hampshire, MD;  Location: [place] CV LAB;                Service: Cardiovascular;  Laterality: N/A; 07/18/2017: CORONARY/GRAFT ACUTE MI REVASCULARIZATION; N/A     Comment:  Procedure: Coronary/Graft Acute MI Revascularization;                Surgeon: Wellington Hampshire, MD;  Location: [place]               CV LAB;  Service: Cardiovascular;  Laterality: N/A; 12/21/2014: HEMORRHOID SURGERY; N/A     Comment:  Procedure: HEMORRHOIDECTOMY;  Surgeon: Christene Lye, MD;  Location: [facility] ORS;  Service: General;                Laterality: N/A; 09/09/2017: INTRAVASCULAR ULTRASOUND/IVUS; N/A     Comment:  Procedure: Intravascular Ultrasound/IVUS;  Surgeon:               Wellington Hampshire, MD;  Location: [place] CV LAB;                Service: Cardiovascular;  Laterality: N/A; age 20: KNEE SURGERY; Right 07/18/2017: LEFT HEART CATH AND CORONARY ANGIOGRAPHY; N/A     Comment:  Procedure: LEFT HEART CATH AND CORONARY ANGIOGRAPHY;                Surgeon: Wellington Hampshire, MD;  Location: [place]               CV LAB;  Service: Cardiovascular;  Laterality: N/A; 08/19/2017: LEFT HEART CATH AND CORONARY ANGIOGRAPHY; Left     Comment:  Procedure: LEFT HEART CATH AND CORONARY ANGIOGRAPHY;                Surgeon: Wellington Hampshire, MD;  Location: [place]               CV LAB;  Service: Cardiovascular;  Laterality: Left; 11/07/2017: LEFT HEART CATH AND CORONARY ANGIOGRAPHY; N/A     Comment:  Procedure: LEFT HEART CATH AND CORONARY ANGIOGRAPHY;                Surgeon: Wellington Hampshire, MD;  Location: [place]               CV LAB;  Service:  Cardiovascular;  Laterality: N/A; 10/17/2021: LEFT HEART CATH AND CORONARY ANGIOGRAPHY; N/A     Comment:  Procedure: LEFT HEART CATH AND CORONARY ANGIOGRAPHY;                Surgeon: Wellington Hampshire, MD;  Location: [place]               CV LAB;  Service: Cardiovascular;  Laterality: N/A; 2013: ROTATOR CUFF REPAIR; Right  BMI    Body Mass Index: 27.88 kg/m      Reproductive/Obstetrics  negative OB ROS                             Anesthesia Physical Anesthesia Plan  ASA: 3  Anesthesia Plan: General ETT   Post-op Pain Management: Ofirmev IV (intra-op)* and Dilaudid IV   Induction: Intravenous  PONV Risk Score and Plan: 2 and Ondansetron, Dexamethasone, Midazolam and Treatment may vary due to age or medical condition  Airway Management Planned: Oral ETT  Additional Equipment:   Intra-op Plan:   Post-operative Plan: Extubation in OR  Informed Consent: I have reviewed the patients History and Physical, chart, labs and discussed the procedure including the risks, benefits and alternatives for the proposed anesthesia with the patient or authorized representative who has indicated his/her understanding and acceptance.     Dental Advisory Given  Plan Discussed with: Anesthesiologist, CRNA and Surgeon  Anesthesia Plan Comments: (Patient consented for risks of anesthesia including but not limited to:  - adverse reactions to medications - damage to eyes, teeth, lips or other oral mucosa - nerve damage due to positioning  - sore throat or hoarseness - Damage to heart, brain, nerves, lungs, other parts of body or loss of life  Patient voiced understanding.)        Anesthesia Quick Evaluation

## 2022-08-22 NOTE — Interval H&P Note (Signed)
History and Physical Interval Note:  08/22/2022 6:57 AM  Todd Weaver  has presented today for surgery, with the diagnosis of G95.9 Cervical myelopathy M48.02 Cervical stenosis of spinal canal G95.89 Myelomalacia.  The various methods of treatment have been discussed with the patient and family. After consideration of risks, benefits and other options for treatment, the patient has consented to  Procedure(s): C4-6 ANTERIOR CERVICAL DISCECTOMY AND FUSION (NUVASIVE ACP, GLOBUS HEDRON) (N/A) as a surgical intervention.  The patient's history has been reviewed, patient examined, no change in status, stable for surgery.  I have reviewed the patient's chart and labs.  Questions were answered to the patient's satisfaction.    Heart sounds normal no MRG. Chest Clear to Auscultation Bilaterally.    Emira Eubanks

## 2022-08-22 NOTE — Discharge Instructions (Addendum)
Your surgeon has performed an operation on your cervical spine (neck) to relieve pressure on the spinal cord and/or nerves. This involved making an incision in the front of your neck and removing one or more of the discs that support your spine. Next, a small piece of bone, a titanium plate, and screws were used to fuse two or more of the vertebrae (bones) together.  The following are instructions to help in your recovery once you have been discharged from the hospital. Even if you feel well, it is important that you follow these activity guidelines. If you do not let your neck heal properly from the surgery, you can increase the chance of return of your symptoms and other complications.  * Do not take anti-inflammatory medications for 3 months after surgery (naproxen [Aleve], ibuprofen [Advil, Motrin], etc.). These medications can prevent your bones from healing properly.  Resume Asprin and Plavix as instructed on pre-op paperwork  Activity    No bending, lifting, or twisting ("BLT"). Avoid lifting objects heavier than 10 pounds (gallon milk jug).  Where possible, avoid household activities that involve lifting, bending, reaching, pushing, or pulling such as laundry, vacuuming, grocery shopping, and childcare. Try to arrange for help from friends and family for these activities while your back heals.  Increase physical activity slowly as tolerated.  Taking short walks is encouraged, but avoid strenuous exercise. Do not jog, run, bicycle, lift weights, or participate in any other exercises unless specifically allowed by your doctor.  Talk to your doctor before resuming sexual activity.  You should not drive until cleared by your doctor.  Until released by your doctor, you should not return to work or school.  You should rest at home and let your body heal.   You may shower three days after your surgery.  After showering, lightly dab your incision dry. Do not take a tub bath or go swimming until  approved by your doctor at your follow-up appointment.  If your doctor ordered a cervical collar (neck brace) for you, you should wear it whenever you are out of bed. You may remove it when lying down or sleeping, but you should wear it at all other times. Not all neck surgeries require a cervical collar.  If you smoke, we strongly recommend that you quit.  Smoking has been proven to interfere with normal bone healing and will dramatically reduce the success rate of your surgery. Please contact [facility] (800-QUIT-NOW) and use the resources at www.[facility].com for assistance in stopping smoking.  Surgical Incision   If you have a dressing on your incision, you may remove it two days after your surgery. Keep your incision area clean and dry.  If you have staples or stitches on your incision, you should have a follow up scheduled for removal. If you do not have staples or stitches, you will have steri-strips (small pieces of surgical tape) or Dermabond glue. The steri-strips/glue should begin to peel away within about a week (it is fine if the steri-strips fall off before then). If the strips are still in place one week after your surgery, you may gently remove them.  Diet           You may return to your usual diet. However, you may experience discomfort when swallowing in the first month after your surgery. This is normal. You may find that softer foods are more comfortable for you to swallow. Be sure to stay hydrated.  When to Contact us  You may experience pain  in your neck and/or pain between your shoulder blades. This is normal and should improve in the next few weeks with the help of pain medication, muscle relaxers, and rest. Some patients report that a warm compress on the back of the neck or between the shoulder blades helps.  However, should you experience any of the following, contact us immediately: New numbness or weakness Pain that is progressively getting worse, and is not  relieved by your pain medication, muscle relaxers, rest, and warm compresses Bleeding, redness, swelling, pain, or drainage from surgical incision Chills or flu-like symptoms Fever greater than 101.0 F (38.3 C) Inability to eat, drink fluids, or take medications Problems with bowel or bladder functions Difficulty breathing or shortness of breath Warmth, tenderness, or swelling in your calf Contact Information During office hours (Monday-Friday 9 am to 5 pm), please call your physician at [phone] and ask for Berdine Addison After hours and weekends, please call [phone] and speak with the neurosurgeon on call For a life-threatening emergency, call [place]   The drugs that you were given will stay in your system until tomorrow so for the next 24 hours you should not:  Drive an automobile Make any legal decisions Drink any alcoholic beverage   You may resume regular meals tomorrow.  Today it is better to start with liquids and gradually work up to solid foods.  You may eat anything you prefer, but it is better to start with liquids, then soup and crackers, and gradually work up to solid foods.   Please notify your doctor immediately if you have any unusual bleeding, trouble breathing, redness and pain at the surgery site, drainage, fever, or pain not relieved by medication.    Additional Instructions:        Please contact your physician with any problems or Same Day Surgery at [phone], Monday through Friday 6 am to 4 pm, or [place] at [facility] number at [phone].

## 2022-08-23 ENCOUNTER — Encounter: Payer: Self-pay | Admitting: Neurosurgery

## 2022-08-23 DIAGNOSIS — I480 Paroxysmal atrial fibrillation: Secondary | ICD-10-CM | POA: Diagnosis not present

## 2022-08-23 DIAGNOSIS — G9589 Other specified diseases of spinal cord: Secondary | ICD-10-CM | POA: Diagnosis not present

## 2022-08-23 DIAGNOSIS — M4802 Spinal stenosis, cervical region: Secondary | ICD-10-CM | POA: Diagnosis not present

## 2022-08-23 DIAGNOSIS — Z79899 Other long term (current) drug therapy: Secondary | ICD-10-CM | POA: Diagnosis not present

## 2022-08-23 DIAGNOSIS — Z87891 Personal history of nicotine dependence: Secondary | ICD-10-CM | POA: Diagnosis not present

## 2022-08-23 DIAGNOSIS — Z8673 Personal history of transient ischemic attack (TIA), and cerebral infarction without residual deficits: Secondary | ICD-10-CM | POA: Diagnosis not present

## 2022-08-23 DIAGNOSIS — I2510 Atherosclerotic heart disease of native coronary artery without angina pectoris: Secondary | ICD-10-CM | POA: Diagnosis not present

## 2022-08-23 DIAGNOSIS — I110 Hypertensive heart disease with heart failure: Secondary | ICD-10-CM | POA: Diagnosis not present

## 2022-08-23 DIAGNOSIS — I5022 Chronic systolic (congestive) heart failure: Secondary | ICD-10-CM | POA: Diagnosis not present

## 2022-08-23 MED ORDER — ACETAMINOPHEN 500 MG PO TABS
ORAL_TABLET | ORAL | Status: AC
  Filled 2022-08-23: qty 2

## 2022-08-23 MED ORDER — CARVEDILOL 12.5 MG PO TABS
ORAL_TABLET | ORAL | Status: AC
  Filled 2022-08-23: qty 1

## 2022-08-23 MED ORDER — ACETAMINOPHEN 500 MG PO TABS
ORAL_TABLET | ORAL | Status: AC
  Administered 2022-08-23: 1000 mg via ORAL
  Filled 2022-08-23: qty 2

## 2022-08-23 MED ORDER — OXYCODONE HCL 5 MG PO TABS
ORAL_TABLET | ORAL | Status: AC
  Filled 2022-08-23: qty 2

## 2022-08-23 MED ORDER — ENOXAPARIN SODIUM 40 MG/0.4ML IJ SOSY
PREFILLED_SYRINGE | INTRAMUSCULAR | Status: AC
  Filled 2022-08-23: qty 0.4

## 2022-08-23 MED ORDER — METHOCARBAMOL 500 MG PO TABS
ORAL_TABLET | ORAL | Status: AC
  Filled 2022-08-23: qty 1

## 2022-08-23 MED ORDER — KETOROLAC TROMETHAMINE 15 MG/ML IJ SOLN
INTRAMUSCULAR | Status: AC
  Filled 2022-08-23: qty 1

## 2022-08-23 NOTE — TOC Initial Note (Signed)
Transition of Care [facility]) - Initial/Assessment Note    Patient Details  Name: Todd Weaver MRN: [id number] Date of Birth: 1951/03/31  [facility]) CM/SW Contact:    Conception Oms, RN Phone Number: 08/23/2022, 9:16 AM  Clinical Narrative:                 The patient is set up with [facility] for [facility] services prior to surgery by the Surgeons office        Patient Goals and CMS Choice            Expected Discharge Plan and Services         Expected Discharge Date: 08/23/22                                    Prior Living Arrangements/Services                       Activities of Daily Living Home Assistive Devices/Equipment: None ADL Screening (condition at time of admission) Patient's cognitive ability adequate to safely complete daily activities?: Yes Is the patient deaf or have difficulty hearing?: No Does the patient have difficulty seeing, even when wearing glasses/contacts?: No Does the patient have difficulty concentrating, remembering, or making decisions?: No Patient able to express need for assistance with ADLs?: Yes Does the patient have difficulty dressing or bathing?: No Independently performs ADLs?: Yes (appropriate for developmental age) Does the patient have difficulty walking or climbing stairs?: No Weakness of Legs: None Weakness of Arms/Hands: None  Permission Sought/Granted                  Emotional Assessment              Admission diagnosis:  Cervical myelopathy ([place]) [G95.9] Patient Active Problem List   Diagnosis Date Noted   Myelopathy ([place]) 08/22/2022   Cervical spinal stenosis 08/22/2022   Myelomalacia ([place]) 08/22/2022   Cervical myelopathy ([place]) 08/22/2022   Primary insomnia 06/18/2022   Chronic deep vein thrombosis (DVT) of brachial vein of left upper extremity ([place]) 12/28/2021   Contrast media allergy 12/12/2021   GERD (gastroesophageal reflux disease) 12/12/2021   Hemorrhoid 12/12/2021    Ischemic cardiomyopathy 12/12/2021   Neck muscle spasm 12/26/2020   Urinary hesitancy 12/26/2020   Carpal tunnel syndrome, bilateral 00/92/3300   Chronic systolic congestive heart failure ([place]) 09/29/2020   Thrombophilia ([place]) 09/29/2020   PAF (paroxysmal atrial fibrillation) ([place]) 04/11/2020   PAD (peripheral artery disease) ([place]) 03/31/2019   Rectal polyp    Impingement syndrome of left shoulder region 10/14/2018   Coronary artery disease of native artery of native heart with stable angina pectoris ([place]) 10/03/2018   Essential hypertension 06/19/2017   Mixed hyperlipidemia 06/19/2017   Overweight (BMI 25.0-29.9) 06/19/2017   BPH (benign prostatic hyperplasia) 06/19/2017   ED (erectile dysfunction) 06/19/2017   MCI (mild cognitive impairment) 06/19/2017   Lumbar spondylosis with myelopathy 11/08/2016   Hand dermatitis 07/26/2014   Hx of transient ischemic attack (TIA) 2011   PCP:  Glean Hess, MD Pharmacy:   [facility] - [place], [place] [address] Phone: [phone] Fax: [phone]  [id number] - [place], [place] [address] [place] [place] [address] Phone: [phone] Fax: [phone]  Social Determinants of Health (SDOH) Social History: SDOH Screenings   Food Insecurity: No Food Insecurity (08/22/2022)  Housing: Low Risk  (08/22/2022)  Transportation Needs: No Transportation Needs (08/22/2022)  Utilities: Not At Risk (08/22/2022)  Alcohol Screen: Low Risk  (12/20/2021)  Depression (PHQ2-9): Medium Risk (06/18/2022)  Financial Resource Strain: Low Risk  (12/20/2021)  Physical Activity: Inactive (12/20/2021)  Social Connections: Moderately Integrated (12/20/2021)  Stress: No Stress Concern Present (12/20/2021)  Tobacco Use: Medium Risk (08/22/2022)   SDOH Interventions:     Readmission Risk Interventions     No data to  display

## 2022-08-23 NOTE — Progress Notes (Signed)
Attending Progress Note  History: ISIAHA GREENUP is a 72 y.o presenting with cervical myelopathy s/p C4-6 ACDF.  POD1: NAEO. Pt reports pain well controlled   Physical Exam: Vitals:   08/23/22 0626 08/23/22 0750  BP: 120/64 127/70  Pulse: 60 (!) 54  Resp: 16 15  Temp: 97.8 F (36.6 C) 97.8 F (36.6 C)  SpO2: 95% 99%    AA Ox3 CNI  Strength:5/5 throughout BUE  Data:  Other tests/results: none   Assessment/Plan:  EDREI NORGAARD is a 72 y.o sp ACDF for myelopathy/  - mobilize - pain control - plan to d/c this morning. Medications sent to pharmacy yesterday  Cooper Render PA-C Department of Neurosurgery

## 2022-08-27 ENCOUNTER — Ambulatory Visit: Payer: Medicare HMO | Attending: Cardiovascular Disease | Admitting: Cardiovascular Disease

## 2022-08-27 ENCOUNTER — Telehealth: Payer: Self-pay | Admitting: Cardiovascular Disease

## 2022-08-27 ENCOUNTER — Encounter: Payer: Self-pay | Admitting: Cardiovascular Disease

## 2022-08-27 ENCOUNTER — Telehealth: Payer: Self-pay

## 2022-08-27 VITALS — BP 118/62 | HR 57 | Ht 67.0 in | Wt 178.1 lb

## 2022-08-27 DIAGNOSIS — I739 Peripheral vascular disease, unspecified: Secondary | ICD-10-CM | POA: Diagnosis not present

## 2022-08-27 DIAGNOSIS — I110 Hypertensive heart disease with heart failure: Secondary | ICD-10-CM | POA: Diagnosis not present

## 2022-08-27 DIAGNOSIS — I480 Paroxysmal atrial fibrillation: Secondary | ICD-10-CM | POA: Diagnosis not present

## 2022-08-27 DIAGNOSIS — I2510 Atherosclerotic heart disease of native coronary artery without angina pectoris: Secondary | ICD-10-CM | POA: Diagnosis not present

## 2022-08-27 DIAGNOSIS — I5022 Chronic systolic (congestive) heart failure: Secondary | ICD-10-CM

## 2022-08-27 DIAGNOSIS — I255 Ischemic cardiomyopathy: Secondary | ICD-10-CM | POA: Diagnosis not present

## 2022-08-27 DIAGNOSIS — I25118 Atherosclerotic heart disease of native coronary artery with other forms of angina pectoris: Secondary | ICD-10-CM | POA: Diagnosis not present

## 2022-08-27 DIAGNOSIS — I252 Old myocardial infarction: Secondary | ICD-10-CM | POA: Diagnosis not present

## 2022-08-27 DIAGNOSIS — Z981 Arthrodesis status: Secondary | ICD-10-CM | POA: Diagnosis not present

## 2022-08-27 DIAGNOSIS — Z4789 Encounter for other orthopedic aftercare: Secondary | ICD-10-CM | POA: Diagnosis not present

## 2022-08-27 NOTE — Telephone Encounter (Signed)
-----   Message from [place] sent at 08/27/2022 10:32 AM EST ----- Regarding: BP low- Raquel Sarna [facility] BP running low and orthostatic Sitting BP 108/62 standing 86/60 a bit lightheaded, Raquel Sarna from [place] has called his cardiologist and is getting him in to be seen '@11'$  today.

## 2022-08-27 NOTE — Progress Notes (Signed)
Cardiology Office Note   Date:  08/27/2022   ID:  Todd Weaver, Todd Weaver 1951-06-02, MRN [id number]  PCP:  Todd Hess, MD  Cardiologist:   Todd Rogue, MD   Chief Complaint  Patient presents with   Hypotension    Patient c/o decrease in blood pressure, dizziness s/p cervical surgery. Medications reviewed by the patient verbally.       History of Present Illness: Todd Weaver is a 72 y.o. male who presents for a follow-up visit regarding coronary artery disease inferior ST elevation myocardial infarction complicated by ventricular fibrillation arrest in December 2018  PCI and 2 overlapped drug-eluting stent placement to the right coronary artery.  significant ostial LAD stenosis, borderline significant proximal LAD stenosis and moderate distal LAD stenosis.   Ejection fraction was mildly reduced.   atrial fibrillation after the procedure treated with amiodarone. A staged IVUS guided drug-eluting stent placement to the proximal and ostial LAD was done in February 2019.  cardiac catheterization April 2019 showed widely patent LAD and RCA stent with no significant restenosis.  The ramus branch was pinched by the LAD stent with 70% ostial stenosis.  EF was 50 to 55%. ---previous stroke, hyperlipidemia, previous tobacco use and BPH. Who presents for routine follow-up of his coronary disease, stable angina  Last seen in clinic June 2022 seen by one of our providers October 2023 [facility] June 2022, low risk Continued left arm and chest pain left heart catheterization October 17, 2021 widely patent RCA and LAD stents with no significant restenosis.  Stable 70% stenosis in the ostial ramus.  Otherwise, no obstructive disease.  Mildly reduced LV systolic function and mildly elevated LVEDP were noted.   left upper extremity venous duplex demonstrated a chronic thrombus involving one of the left brachial veins.   referred to vascular surgery for further input with subsequent  recommendation for conservative management.  Subsequent hypercoagulable work-up was unrevealing.     In follow-up July 2023 reported having constipation on Entresto, dose was reduced limited echo in 03/2022 demonstrated an EF of 45 to 50%, global hypokinesis, grade 2 diastolic dysfunction, normal RV systolic function, and no significant valvular abnormalities. Given echo findings, he was initiated on spironolactone 12.5 mg daily.   In follow-up today he reports having some lightheadedness at times Reports sometimes takes all his morning medications without proper breakfast Reports he is taking full dose Entresto 49/51 mg twice daily BP at Home 786 systolic Lightheaded when he stands up at times Reports that he drinks lots of fluids overnight, gets up frequently to urinate  Denies significant left chest pain or left arm pain  Recovering from neck surgery, anterior cervical approach Scheduled start with PT  EKG personally reviewed by myself on todays visit Normal sinus rhythm rate 57 bpm nonspecific T wave abnormality  Other past medical history reviewed History of peripheral arterial disease being treated medically.   Noninvasive vascular studies in July 2020 showed moderately reduced ABI  in the 0.7 range bilaterally with diffuse moderate SFA disease worse on the left side.   repeat ABI done in July 2021 which was stable overall.  nuclear stress test in February 2021 showed no evidence of ischemia with normal ejection fraction.   Echocardiogram in March showed an EF of 45 to 50% with mild mitral regurgitation. Past Medical History:  Diagnosis Date   Acute ST elevation myocardial infarction (STEMI) of inferior wall (Todd Weaver) 07/18/2017   a.) LHC/PCI 07/18/2017: 99% o-mRCA --> overlapping 3.0 x 38  mm and 3.25 x 18 mm Xience Sierra DES   Alcohol dependence, daily use (Todd Weaver)    a.) 3-4 beers daily   Angina pectoris ([place]) 11/06/2017   BPH (benign prostatic hyperplasia) 06/19/2017   Cardiac  arrest with ventricular fibrillation (Todd Weaver) 07/18/2017   a.) in setting of acute inferior STEMI; required defib in field by EMS and again in the ED   Carpal tunnel syndrome of right wrist 12/26/2020   Cervical spinal stenosis    Chronic deep vein thrombosis (DVT) of brachial vein of left upper extremity (Todd Weaver)    Chronic systolic congestive heart failure (Todd Weaver) 09/29/2019   a.) TTE 09/29/2019: EF 45-50%, glob HK, mild MR, mild PA dil, G2DD; b.) TTE 11/28/2021: EF 45-50%, glob HK, G2DD; c.) TTE 04/18/2022: EF 45-50%, glob HK, G2DD   Coronary artery disease of native artery of native heart with stable angina pectoris (Todd Weaver) 07/18/2017   a.) LHC/PCI 07/18/17: 99 o-mRCA (overlapping 3.0x75m & 3.25x163mXience Sierra DES), 60 post atrio, 85 dLM-oLAD, 40 pLCx, 60 pLAD, 40 mLAD; b.) LHC 08/19/17: 85 dLM-oLAD (PTCA), 60 pLAD, 30 mLAD, 40 pLCx, 50 RI; c.) LHC/PCI 09/09/17: unchanged LHC. 3.0 x 2848mience Sierra DES dLM-oLAD; d.) LHC 11/07/17: 30 mLM, 40 pLCx, 70 33, 40 dLAD - Rx mgmt; e.) LHC 10/17/21: 70 oRI, 20 mLAD, 30 pLCx - Rx mgmt   ED (erectile dysfunction) 06/19/2017   a.) on PDE5i (sildenafil)   Essential hypertension 06/19/2017   GERD (gastroesophageal reflux disease)    Hand dermatitis 07/26/2014   Hemorrhoid    Hyperlipidemia    Hypertension    Impingement syndrome of left shoulder region 10/14/2018   Ischemic cardiomyopathy    a.) TTE 07/18/2017: EF 45-50%, mod inf/infsept HK, mild LAE, LVH; b.) LHC 08/19/2017: EF 45%; c.) TTE 09/29/2019: EF 45-50%, glob HK, G2DD; d.) MPI 01/18/2021: EF 47%; e.) TTE 11/28/2021: EF 45-50%, glob HK, G2DD; f.) TTE 04/18/2022:  EF 45-50%, glob HK, G2DD   Long term current use of antithrombotics/antiplatelets    a.) on DAPT (ASA + clopidogrel)   Lumbar spondylosis with myelopathy 11/08/2016   MCI (mild cognitive impairment) 06/19/2017   Myelomalacia of cervical cord (HCC)    Overweight (BMI 25.0-29.9) 06/19/2017   PAD (peripheral artery disease) ([place]  a.  01/2019 ABIs and Duplex: ABI R 0.78, L 0.79; TBI R 0.66, L 1.11. RCIA >50p, RSFA 50-74. LCIA >50p, LSFA 75-99p, 30-49d. 3 vessel runoff bilat.   PAF (paroxysmal atrial fibrillation) ([place]  a. Brief episode during December hospitalization-->converted on IV amio-->not on OACHarrod Rectal polyp    Stroke ([place]   a.) seen acutely on brain MRI 09/26/2005 --> posterior frontal, mid parietal lobe, RIGHT occipital lobe --> tiny ischemic infarctions secondary to a shower of emboli  (watershed ischemic pattern may be present)   Thrombophilia ([place]/04/2021   Urinary hesitancy 12/26/2020    Past Surgical History:  Procedure Laterality Date   ANAL FISTULECTOMY N/A 12/21/2014   Procedure: FISTULECTOMY ANAL;  Surgeon: SeeChristene LyeD;  Location: [facility] ORS;  Service: General;  Laterality: N/A;   ANTERIOR CERVICAL DECOMP/DISCECTOMY FUSION N/A 08/22/2022   Procedure: C4-6 ANTERIOR CERVICAL DISCECTOMY AND FUSION (NUVASIVE ACP, GLOBUS HEDRON);  Surgeon: YarMeade MawD;  Location: [facility] ORS;  Service: Neurosurgery;  Laterality: N/A;   APPENDECTOMY  1975   CARDIAC CATHETERIZATION     COLONOSCOPY WITH PROPOFOL N/A 02/12/2019   Procedure: COLONOSCOPY WITH PROPOFOL;  Surgeon: WohLucilla LameD;  Location: [facility] ENDOSCOPY;  Service: Endoscopy;  Laterality: N/A;   CORONARY STENT INTERVENTION N/A 09/09/2017   Procedure: CORONARY STENT INTERVENTION;  Surgeon: Wellington Hampshire, MD;  Location: [place] [facility];  Service: Cardiovascular;  Laterality: N/A;   CORONARY/GRAFT ACUTE MI REVASCULARIZATION N/A 07/18/2017   Procedure: Coronary/Graft Acute MI Revascularization;  Surgeon: Wellington Hampshire, MD;  Location: [place] CV LAB;  Service: Cardiovascular;  Laterality: N/A;   HEMORRHOID SURGERY N/A 12/21/2014   Procedure: HEMORRHOIDECTOMY;  Surgeon: Christene Lye, MD;  Location: [facility] ORS;  Service: General;  Laterality: N/A;   INTRAVASCULAR ULTRASOUND/IVUS N/A 09/09/2017   Procedure:  Intravascular Ultrasound/IVUS;  Surgeon: Wellington Hampshire, MD;  Location: [place] [facility] LAB;  Service: Cardiovascular;  Laterality: N/A;   KNEE SURGERY Right age 44   LEFT HEART CATH AND CORONARY ANGIOGRAPHY N/A 07/18/2017   Procedure: LEFT HEART CATH AND CORONARY ANGIOGRAPHY;  Surgeon: Wellington Hampshire, MD;  Location: [place] [facility];  Service: Cardiovascular;  Laterality: N/A;   LEFT HEART CATH AND CORONARY ANGIOGRAPHY Left 08/19/2017   Procedure: LEFT HEART CATH AND CORONARY ANGIOGRAPHY;  Surgeon: Wellington Hampshire, MD;  Location: [place] [facility];  Service: Cardiovascular;  Laterality: Left;   LEFT HEART CATH AND CORONARY ANGIOGRAPHY N/A 11/07/2017   Procedure: LEFT HEART CATH AND CORONARY ANGIOGRAPHY;  Surgeon: Wellington Hampshire, MD;  Location: [place] [facility] LAB;  Service: Cardiovascular;  Laterality: N/A;   LEFT HEART CATH AND CORONARY ANGIOGRAPHY N/A 10/17/2021   Procedure: LEFT HEART CATH AND CORONARY ANGIOGRAPHY;  Surgeon: Wellington Hampshire, MD;  Location: [place] [facility];  Service: Cardiovascular;  Laterality: N/A;   ROTATOR CUFF REPAIR Right 2013     Current Outpatient Medications  Medication Sig Dispense Refill   amLODipine (NORVASC) 5 MG tablet Take 5 mg by mouth daily.     aspirin EC 81 MG tablet Take 81 mg by mouth daily.      carvedilol (COREG) 3.125 MG tablet TAKE 1 TABLET TWICE DAILY WITH MEALS 180 tablet 0   clotrimazole-betamethasone (LOTRISONE) cream Apply 1 Application topically 2 (two) times daily. (Patient taking differently: Apply 1 Application topically as needed.) 45 g 1   ezetimibe (ZETIA) 10 MG tablet Take 1 tablet (10 mg total) by mouth daily. 90 tablet 3   gabapentin (NEURONTIN) 300 MG capsule Take 300 mg by mouth 2 (two) times daily.     methocarbamol (ROBAXIN) 500 MG tablet Take 1 tablet (500 mg total) by mouth every 6 (six) hours as needed for muscle spasms. 120 tablet 0   nitroGLYCERIN (NITROSTAT) 0.4 MG SL tablet Place 1 tablet (0.4 mg  total) under the tongue every 5 (five) minutes as needed for chest pain. 30 tablet 0   oxybutynin (DITROPAN-XL) 10 MG 24 hr tablet Take 10 mg by mouth at bedtime.     oxyCODONE-acetaminophen (PERCOCET) 5-325 MG tablet Take 1 tablet by mouth every 4 (four) hours as needed for up to 5 days for severe pain. 30 tablet 0   rosuvastatin (CRESTOR) 40 MG tablet Take 1 tablet (40 mg total) by mouth daily. (Patient taking differently: Take 40 mg by mouth at bedtime.) 90 tablet 3   sacubitril-valsartan (ENTRESTO) 49-51 MG Take 1 tablet by mouth 2 (two) times daily.     senna (SENOKOT) 8.6 MG TABS tablet Take 1 tablet (8.6 mg total) by mouth daily as needed. 30 tablet 0   sildenafil (REVATIO) 20 MG tablet Take 1 tablet (20 mg total) by mouth 3 (three) times daily  as needed. 90 tablet 1   spironolactone (ALDACTONE) 25 MG tablet Take 1 tablet (25 mg total) by mouth daily. 90 tablet 3   tamsulosin (FLOMAX) 0.4 MG CAPS capsule Take 1 capsule (0.4 mg total) by mouth daily. 90 capsule 3   No current facility-administered medications for this visit.    Allergies:   Codeine, Shellfish allergy, Contrast media [iodinated contrast media], and Fish allergy   Social History:  The patient  reports that he quit smoking about 15 years ago. His smoking use included cigarettes. He has been exposed to tobacco smoke. He has quit using smokeless tobacco.  His smokeless tobacco use included chew. He reports current alcohol use of about 5.0 - 6.0 standard drinks of alcohol per week. He reports that he does not use drugs.   Family History:  The patient's family history includes Alzheimer's disease in his father and mother; Hypertension in his father and mother.    ROS:   Review of Systems  Constitutional: Negative.   HENT: Negative.    Respiratory: Negative.    Cardiovascular:  Positive for chest pain.  Gastrointestinal: Negative.   Musculoskeletal: Negative.   Neurological: Negative.   Psychiatric/Behavioral: Negative.     All other systems reviewed and are negative.   PHYSICAL EXAM: VS:  BP 118/62 (BP Location: Left Arm, Patient Position: Sitting, Cuff Size: Normal)   Pulse (!) 57   Ht '5\' 7"'$  (1.702 m)   Wt 178 lb 2 oz (80.8 kg)   SpO2 97%   BMI 27.90 kg/m  , BMI Body mass index is 27.9 kg/m. Constitutional:  oriented to person, place, and time. No distress.  HENT:  Head: Grossly normal Eyes:  no discharge. No scleral icterus.  Neck: No JVD, no carotid bruits, anterior cervical incision on left well-healed Cardiovascular: Regular rate and rhythm, no murmurs appreciated Pulmonary/Chest: Clear to auscultation bilaterally, no wheezes or rails Abdominal: Soft.  no distension.  no tenderness.  Musculoskeletal: Normal range of motion Neurological:  normal muscle tone. Coordination normal. No atrophy Skin: Skin warm and dry Psychiatric: normal affect, pleasant  Recent Labs: 11/07/2021: TSH 2.390 04/17/2022: ALT 36 07/12/2022: B Natriuretic Peptide 22.6; Hemoglobin 14.1; Platelets 292 08/15/2022: BUN 16; Creatinine, Ser 1.14; Potassium 4.8; Sodium 137    Lipid Panel    Component Value Date/Time   CHOL 166 04/17/2022 0918   TRIG 55 04/17/2022 0918   HDL 63 04/17/2022 0918   CHOLHDL 2.6 04/17/2022 0918   CHOLHDL 2.8 11/07/2017 0511   VLDL 4 11/07/2017 0511   LDLCALC 92 04/17/2022 0918   LDLDIRECT 116 (H) 09/24/2017 1338      Wt Readings from Last 3 Encounters:  08/27/22 178 lb 2 oz (80.8 kg)  08/22/22 178 lb (80.7 kg)  08/15/22 178 lb (80.7 kg)        03/13/2016    2:39 PM  PAD Screen  Previous PAD dx? No  Previous surgical procedure? No  Pain with walking? No  Feet/toe relief with dangling? Yes  Painful, non-healing ulcers? No  Extremities discolored? No     ASSESSMENT AND PLAN:  1.  Peripheral arterial disease:  Denies claudication symptoms Lipids trending upwards with his weight, discussed need for calorie restriction If cholesterol continues to run high may need to add  PCSK9 inhibitor to his Crestor Zetia  2. Coronary artery disease involving native coronary arteries with stable angina:  Currently with no symptoms of angina. No further workup at this time. Continue current medication regimen.  Last catheterization March  2023 Widely patent RCA and LAD stents with no significant restenosis. Stable 70% stenosis in ostial ramus. No other obstructive disease.   3.  Chronic systolic/diastolic heart failure: Appears euvolemic, no significant leg edema  4.  Hyperlipidemia:  Continue high-dose atorvastatin and Zetia.   With weight gain numbers running higher, may need PCSK9 inhibitor if unable to get the numbers down  5.  Essential hypertension:  History of chronic bradycardia Reports having some orthostasis symptoms, recommend he take his medications in the morning with food Recommend moving the amlodipine to the evening, decrease dose down to 2.5 daily    Total encounter time more than 30 minutes  Greater than 50% was spent in counseling and coordination of care with the patient   Todd Rogue, MD  08/27/2022 11:51 AM    [place]

## 2022-08-27 NOTE — Telephone Encounter (Signed)
Pt c/o BP issue: STAT if pt c/o blurred vision, one-sided weakness or slurred speech  1. What are your last 5 BP readings? 108/62 Sitting/ 86/60 standing  2. Are you having any other symptoms (ex. Dizziness, headache, blurred vision, passed out)? Pt was light headed today/  he was lightheaded the other day when he was up walking around as well.    3. What is your BP issue? Low bp    Physical Therapist is out at the house today for this pt checking on him after he a ACDF from c4 to C6.  Pt Bp was low  She stated she thought some of his meds were changed.    Best call back number [phone]

## 2022-08-27 NOTE — Patient Instructions (Addendum)
Medication Instructions:  Move the amlodipine to evening, cut in 1/2 daily (2.5 mg daily). Call the office if this helps and we will call in a new prescription.  If you need a refill on your cardiac medications before your next appointment, please call your pharmacy.   Lab work: No new labs needed  Testing/Procedures: No new testing needed  Follow-Up: At [facility], you and your health needs are our priority.  As part of our continuing mission to provide you with exceptional heart care, we have created designated Provider Care Teams.  These Care Teams include your primary Cardiologist (physician) and Advanced Practice Providers (APPs -  Physician Assistants and Nurse Practitioners) who all work together to provide you with the care you need, when you need it.  You will need a follow up appointment in 12 months  Providers on your designated Care Team:   Murray Hodgkins, NP [name], PA-C Cadence Kathlen Mody, [place]  COVID-19 Vaccine Information can be found at: [email or web address] For questions related to vaccine distribution or appointments, please email [email or web address] or call [phone].

## 2022-08-27 NOTE — Telephone Encounter (Signed)
FYI

## 2022-08-27 NOTE — Telephone Encounter (Signed)
Patient called in through triage line via physical therapist Cherly Anderson. While performing exercises patient complained of lightheadness and dizziness with episodes of the same this past weekend. PT added that the patient is s/p cervical surgery. BP sitting is 108/62 and standing is 86/60. Patient scheduled in DOD slot with Dr. Rockey Situ today 08/27/22 at 1100

## 2022-08-29 ENCOUNTER — Telehealth: Payer: Self-pay

## 2022-08-29 DIAGNOSIS — I739 Peripheral vascular disease, unspecified: Secondary | ICD-10-CM | POA: Diagnosis not present

## 2022-08-29 DIAGNOSIS — Z981 Arthrodesis status: Secondary | ICD-10-CM | POA: Diagnosis not present

## 2022-08-29 DIAGNOSIS — Z4789 Encounter for other orthopedic aftercare: Secondary | ICD-10-CM | POA: Diagnosis not present

## 2022-08-29 DIAGNOSIS — I255 Ischemic cardiomyopathy: Secondary | ICD-10-CM | POA: Diagnosis not present

## 2022-08-29 DIAGNOSIS — I480 Paroxysmal atrial fibrillation: Secondary | ICD-10-CM | POA: Diagnosis not present

## 2022-08-29 DIAGNOSIS — I2510 Atherosclerotic heart disease of native coronary artery without angina pectoris: Secondary | ICD-10-CM | POA: Diagnosis not present

## 2022-08-29 DIAGNOSIS — I110 Hypertensive heart disease with heart failure: Secondary | ICD-10-CM | POA: Diagnosis not present

## 2022-08-29 DIAGNOSIS — I252 Old myocardial infarction: Secondary | ICD-10-CM | POA: Diagnosis not present

## 2022-08-29 DIAGNOSIS — I5022 Chronic systolic (congestive) heart failure: Secondary | ICD-10-CM | POA: Diagnosis not present

## 2022-08-29 NOTE — Telephone Encounter (Signed)
-----   Message from [place] sent at 08/29/2022 11:53 AM EST ----- Regarding: No bowel Movement since 2/2 Spoke with Raquel Sarna from [place] she says that pt has not had a bowel movement since Friday 2/2, he is taking Senokot and she suggested prune juice and Miralax as that is what is on the protocol. She was calling asking [place] to follow up with him and see if there are any other suggestions we have?

## 2022-08-29 NOTE — Telephone Encounter (Signed)
Per discussion with Andee Poles, will advise him to increase fluids and to purchase some OTC magnesium citrate. Will need to make sure he is passing gas and will discuss red flags that warrant urgent evaluation (severe abdominal pain, abdominal rigidity, nausea/vomiting, inability to pass gas).  Left message to return call.

## 2022-08-29 NOTE — Telephone Encounter (Signed)
Mr. Nicolini called back and I told him that Andee Poles has advised him to increase fluids and to purchase the OTC magnesium citrate. I also told him to call us back if he was experiencing any of the red flags that [facility] had listed.

## 2022-08-31 DIAGNOSIS — I480 Paroxysmal atrial fibrillation: Secondary | ICD-10-CM | POA: Diagnosis not present

## 2022-08-31 DIAGNOSIS — I2510 Atherosclerotic heart disease of native coronary artery without angina pectoris: Secondary | ICD-10-CM | POA: Diagnosis not present

## 2022-08-31 DIAGNOSIS — I739 Peripheral vascular disease, unspecified: Secondary | ICD-10-CM | POA: Diagnosis not present

## 2022-08-31 DIAGNOSIS — I5022 Chronic systolic (congestive) heart failure: Secondary | ICD-10-CM | POA: Diagnosis not present

## 2022-08-31 DIAGNOSIS — Z981 Arthrodesis status: Secondary | ICD-10-CM | POA: Diagnosis not present

## 2022-08-31 DIAGNOSIS — I252 Old myocardial infarction: Secondary | ICD-10-CM | POA: Diagnosis not present

## 2022-08-31 DIAGNOSIS — Z4789 Encounter for other orthopedic aftercare: Secondary | ICD-10-CM | POA: Diagnosis not present

## 2022-08-31 DIAGNOSIS — I110 Hypertensive heart disease with heart failure: Secondary | ICD-10-CM | POA: Diagnosis not present

## 2022-08-31 DIAGNOSIS — I255 Ischemic cardiomyopathy: Secondary | ICD-10-CM | POA: Diagnosis not present

## 2022-09-03 ENCOUNTER — Telehealth: Payer: Self-pay

## 2022-09-03 ENCOUNTER — Encounter: Payer: Self-pay | Admitting: Neurosurgery

## 2022-09-03 DIAGNOSIS — Z981 Arthrodesis status: Secondary | ICD-10-CM | POA: Diagnosis not present

## 2022-09-03 DIAGNOSIS — I480 Paroxysmal atrial fibrillation: Secondary | ICD-10-CM | POA: Diagnosis not present

## 2022-09-03 DIAGNOSIS — I739 Peripheral vascular disease, unspecified: Secondary | ICD-10-CM | POA: Diagnosis not present

## 2022-09-03 DIAGNOSIS — M4802 Spinal stenosis, cervical region: Secondary | ICD-10-CM

## 2022-09-03 DIAGNOSIS — G959 Disease of spinal cord, unspecified: Secondary | ICD-10-CM

## 2022-09-03 DIAGNOSIS — I255 Ischemic cardiomyopathy: Secondary | ICD-10-CM | POA: Diagnosis not present

## 2022-09-03 DIAGNOSIS — I2510 Atherosclerotic heart disease of native coronary artery without angina pectoris: Secondary | ICD-10-CM | POA: Diagnosis not present

## 2022-09-03 DIAGNOSIS — Z4789 Encounter for other orthopedic aftercare: Secondary | ICD-10-CM | POA: Diagnosis not present

## 2022-09-03 DIAGNOSIS — I5022 Chronic systolic (congestive) heart failure: Secondary | ICD-10-CM | POA: Diagnosis not present

## 2022-09-03 DIAGNOSIS — I110 Hypertensive heart disease with heart failure: Secondary | ICD-10-CM | POA: Diagnosis not present

## 2022-09-03 DIAGNOSIS — I252 Old myocardial infarction: Secondary | ICD-10-CM | POA: Diagnosis not present

## 2022-09-03 MED ORDER — OXYCODONE-ACETAMINOPHEN 5-325 MG PO TABS: 1.0000 | ORAL_TABLET | Freq: Four times a day (QID) | ORAL | 0 refills | Status: DC | PRN

## 2022-09-03 NOTE — Telephone Encounter (Signed)
Refill on percocet okay.   PMP reviewed and is appropriate.   Let him know I sent it to [facility] in [place].

## 2022-09-03 NOTE — Telephone Encounter (Signed)
-----   Message from Peggyann [place] sent at 09/03/2022 11:58 AM EST ----- Regarding: postop pain med refill Contact: [phone] Todd Weaver from [facility] calling Patient is still having 6-7 out of 10 pain C4-6 ACDF on 08/22/22 Oxycodone 5/325 every 4 hrs [facility] [place] 1 pill left

## 2022-09-03 NOTE — Telephone Encounter (Signed)
Patient notified of refill.

## 2022-09-05 DIAGNOSIS — I255 Ischemic cardiomyopathy: Secondary | ICD-10-CM | POA: Diagnosis not present

## 2022-09-05 DIAGNOSIS — I110 Hypertensive heart disease with heart failure: Secondary | ICD-10-CM | POA: Diagnosis not present

## 2022-09-05 DIAGNOSIS — I739 Peripheral vascular disease, unspecified: Secondary | ICD-10-CM | POA: Diagnosis not present

## 2022-09-05 DIAGNOSIS — I5022 Chronic systolic (congestive) heart failure: Secondary | ICD-10-CM | POA: Diagnosis not present

## 2022-09-05 DIAGNOSIS — I252 Old myocardial infarction: Secondary | ICD-10-CM | POA: Diagnosis not present

## 2022-09-05 DIAGNOSIS — I480 Paroxysmal atrial fibrillation: Secondary | ICD-10-CM | POA: Diagnosis not present

## 2022-09-05 DIAGNOSIS — Z981 Arthrodesis status: Secondary | ICD-10-CM | POA: Diagnosis not present

## 2022-09-05 DIAGNOSIS — Z4789 Encounter for other orthopedic aftercare: Secondary | ICD-10-CM | POA: Diagnosis not present

## 2022-09-05 DIAGNOSIS — I2510 Atherosclerotic heart disease of native coronary artery without angina pectoris: Secondary | ICD-10-CM | POA: Diagnosis not present

## 2022-09-06 ENCOUNTER — Ambulatory Visit (INDEPENDENT_AMBULATORY_CARE_PROVIDER_SITE_OTHER): Payer: Medicare HMO | Admitting: Neurosurgery

## 2022-09-06 ENCOUNTER — Ambulatory Visit: Payer: Medicare HMO | Admitting: Internal Medicine

## 2022-09-06 ENCOUNTER — Encounter: Payer: Self-pay | Admitting: Neurosurgery

## 2022-09-06 VITALS — BP 130/68 | Temp 97.6°F | Ht 67.0 in | Wt 173.6 lb

## 2022-09-06 DIAGNOSIS — Z09 Encounter for follow-up examination after completed treatment for conditions other than malignant neoplasm: Secondary | ICD-10-CM

## 2022-09-06 DIAGNOSIS — G959 Disease of spinal cord, unspecified: Secondary | ICD-10-CM

## 2022-09-06 DIAGNOSIS — M4802 Spinal stenosis, cervical region: Secondary | ICD-10-CM

## 2022-09-06 DIAGNOSIS — Z981 Arthrodesis status: Secondary | ICD-10-CM

## 2022-09-06 NOTE — Progress Notes (Signed)
   REFERRING PHYSICIAN:  Meade Maw, [place],   [id number]  DOS: 08/22/22 C4-6 ACDF   HISTORY OF PRESENT ILLNESS: Todd Weaver is about 2 weeks status post ACDF. Overall, he is doing well postoperatively.  He reports some interscapular pain but states that this is improving.  He does continue to have some numbness and tingling in his fingertips.  He denies any radicular pain.  He continues to take Robaxin and Percocet as needed.  His Percocet was filled on 09/03/2022.  PHYSICAL EXAMINATION:  NEUROLOGICAL:  General: In no acute distress.   Awake, alert, oriented to person, place, and time.  Pupils equal round and reactive to light.  Facial tone is symmetric.  Tongue protrusion is midline.  There is no pronator drift.  Strength: Side Biceps Triceps Deltoid Interossei Grip Wrist Ext. Wrist Flex.  R '5 5 5 5 5 5 5  '$ L '5 5 5 5 5 5 5   '$ Incision c/d/I  Imaging:  No interval imaging to review  Assessment / Plan: PESACH FRISCH is doing well after C4-6 ACDF. We discussed activity escalation and I have advised the patient to lift up to 10 pounds until 6 weeks after surgery, then increase up to 25 pounds until 12 weeks after surgery.  After 12 weeks post-op, the patient advised to increase activity as tolerated. he will return to clinic in approximately 4 weeks with cervical xrays prior.    Advised to contact the office if any questions or concerns arise.   Cooper Render PA-C Dept of Neurosurgery

## 2022-09-07 ENCOUNTER — Ambulatory Visit (INDEPENDENT_AMBULATORY_CARE_PROVIDER_SITE_OTHER): Payer: Medicare HMO | Admitting: Internal Medicine

## 2022-09-07 ENCOUNTER — Encounter: Payer: Self-pay | Admitting: Internal Medicine

## 2022-09-07 VITALS — BP 136/64 | HR 82 | Ht 67.0 in | Wt 175.0 lb

## 2022-09-07 DIAGNOSIS — D6859 Other primary thrombophilia: Secondary | ICD-10-CM

## 2022-09-07 DIAGNOSIS — I252 Old myocardial infarction: Secondary | ICD-10-CM | POA: Diagnosis not present

## 2022-09-07 DIAGNOSIS — I2510 Atherosclerotic heart disease of native coronary artery without angina pectoris: Secondary | ICD-10-CM | POA: Diagnosis not present

## 2022-09-07 DIAGNOSIS — I739 Peripheral vascular disease, unspecified: Secondary | ICD-10-CM | POA: Diagnosis not present

## 2022-09-07 DIAGNOSIS — Z4789 Encounter for other orthopedic aftercare: Secondary | ICD-10-CM | POA: Diagnosis not present

## 2022-09-07 DIAGNOSIS — I255 Ischemic cardiomyopathy: Secondary | ICD-10-CM | POA: Diagnosis not present

## 2022-09-07 DIAGNOSIS — Z981 Arthrodesis status: Secondary | ICD-10-CM | POA: Diagnosis not present

## 2022-09-07 DIAGNOSIS — I5022 Chronic systolic (congestive) heart failure: Secondary | ICD-10-CM | POA: Diagnosis not present

## 2022-09-07 DIAGNOSIS — I110 Hypertensive heart disease with heart failure: Secondary | ICD-10-CM | POA: Diagnosis not present

## 2022-09-07 DIAGNOSIS — M65331 Trigger finger, right middle finger: Secondary | ICD-10-CM | POA: Diagnosis not present

## 2022-09-07 DIAGNOSIS — I480 Paroxysmal atrial fibrillation: Secondary | ICD-10-CM | POA: Diagnosis not present

## 2022-09-07 NOTE — Progress Notes (Addendum)
Date:  09/07/2022   Name:  Todd Weaver   DOB:  May 15, 1951   MRN:  [id number]   Chief Complaint: Hand Pain (Right hand, Middle Finger- locking in place. Been happening for 6 months now. Had a cortisone shot years ago that helped. /)  HPI Trigger finger - on middle finger right hand.  Often gets stuck and has to use the other hand to release it.  He had an injection in the sheath 03/2019 with [facility].  He would like to be seen there again.  Cervical myelopathy - he is several weeks out from C4-6 anterior fusion.  He has improved sensation in his left hand.  Some shoulder pain but otherwise doing well.  CAD - seen regularly by Cardiology.  He is on Entresto, Coreg, amlodipine, crestor and zetia. He denies chest pain, shortness of breath, tachycardia.  Lab Results  Component Value Date   NA 137 08/15/2022   K 4.8 08/15/2022   CO2 28 08/15/2022   GLUCOSE 100 (H) 08/15/2022   BUN 16 08/15/2022   CREATININE 1.14 08/15/2022   CALCIUM 9.5 08/15/2022   EGFR 77 04/17/2022   GFRNONAA >60 08/15/2022   Lab Results  Component Value Date   CHOL 166 04/17/2022   HDL 63 04/17/2022   LDLCALC 92 04/17/2022   LDLDIRECT 116 (H) 09/24/2017   TRIG 55 04/17/2022   CHOLHDL 2.6 04/17/2022   Lab Results  Component Value Date   TSH 2.390 11/07/2021   Lab Results  Component Value Date   HGBA1C 5.7 (H) 04/17/2022   Lab Results  Component Value Date   WBC 8.2 07/12/2022   HGB 14.1 07/12/2022   HCT 41.8 07/12/2022   MCV 83.9 07/12/2022   PLT 292 07/12/2022   Lab Results  Component Value Date   ALT 36 04/17/2022   AST 33 04/17/2022   ALKPHOS 61 04/17/2022   BILITOT 1.0 04/17/2022   No results found for: "25OHVITD2", "25OHVITD3", "VD25OH"   Review of Systems  Constitutional:  Negative for chills and fatigue.  Respiratory:  Negative for chest tightness, shortness of breath and wheezing.   Cardiovascular:  Negative for chest pain and leg swelling.  Musculoskeletal:  Positive for  arthralgias.  Neurological:  Positive for numbness. Negative for dizziness, light-headedness and headaches.  Psychiatric/Behavioral:  Negative for dysphoric mood and sleep disturbance. The patient is not nervous/anxious.     Patient Active Problem List   Diagnosis Date Noted   Myelopathy ([place]) 08/22/2022   Cervical spinal stenosis 08/22/2022   Myelomalacia ([place]) 08/22/2022   Cervical myelopathy ([place]) 08/22/2022   Primary insomnia 06/18/2022   Chronic deep vein thrombosis (DVT) of brachial vein of left upper extremity ([place]) 12/28/2021   Contrast media allergy 12/12/2021   GERD (gastroesophageal reflux disease) 12/12/2021   Hemorrhoid 12/12/2021   Ischemic cardiomyopathy 12/12/2021   Neck muscle spasm 12/26/2020   Urinary hesitancy 12/26/2020   Carpal tunnel syndrome, bilateral 0000000   Chronic systolic congestive heart failure ([place]) 09/29/2020   Thrombophilia ([place]) 09/29/2020   PAF (paroxysmal atrial fibrillation) ([place]) 04/11/2020   PAD (peripheral artery disease) ([place]) 03/31/2019   Rectal polyp    Impingement syndrome of left shoulder region 10/14/2018   Coronary artery disease of native artery of native heart with stable angina pectoris ([place]) 10/03/2018   Essential hypertension 06/19/2017   Mixed hyperlipidemia 06/19/2017   Overweight (BMI 25.0-29.9) 06/19/2017   BPH (benign prostatic hyperplasia) 06/19/2017   ED (erectile dysfunction) 06/19/2017   MCI (mild cognitive  impairment) 06/19/2017   Lumbar spondylosis with myelopathy 11/08/2016   Hand dermatitis 07/26/2014   Hx of transient ischemic attack (TIA) 2011    Allergies  Allergen Reactions   Codeine Hives and Other (See Comments)   Shellfish Allergy Hives and Swelling   Contrast Media [Iodinated Contrast Media] Itching and Other (See Comments)    Whelts on tongue   Fish Allergy Itching    Past Surgical History:  Procedure Laterality Date   ANAL FISTULECTOMY N/A 12/21/2014   Procedure: FISTULECTOMY ANAL;   Surgeon: Christene Lye, MD;  Location: [facility] ORS;  Service: General;  Laterality: N/A;   ANTERIOR CERVICAL DECOMP/DISCECTOMY FUSION N/A 08/22/2022   Procedure: C4-6 ANTERIOR CERVICAL DISCECTOMY AND FUSION (NUVASIVE ACP, GLOBUS HEDRON);  Surgeon: Meade Maw, MD;  Location: [facility] ORS;  Service: Neurosurgery;  Laterality: N/A;   APPENDECTOMY  1975   CARDIAC CATHETERIZATION     COLONOSCOPY WITH PROPOFOL N/A 02/12/2019   Procedure: COLONOSCOPY WITH PROPOFOL;  Surgeon: Lucilla Lame, MD;  Location: [facility] ENDOSCOPY;  Service: Endoscopy;  Laterality: N/A;   CORONARY STENT INTERVENTION N/A 09/09/2017   Procedure: CORONARY STENT INTERVENTION;  Surgeon: Wellington Hampshire, MD;  Location: [place] [facility] LAB;  Service: Cardiovascular;  Laterality: N/A;   CORONARY/GRAFT ACUTE MI REVASCULARIZATION N/A 07/18/2017   Procedure: Coronary/Graft Acute MI Revascularization;  Surgeon: Wellington Hampshire, MD;  Location: [place] [facility];  Service: Cardiovascular;  Laterality: N/A;   HEMORRHOID SURGERY N/A 12/21/2014   Procedure: HEMORRHOIDECTOMY;  Surgeon: Christene Lye, MD;  Location: [facility] ORS;  Service: General;  Laterality: N/A;   INTRAVASCULAR ULTRASOUND/IVUS N/A 09/09/2017   Procedure: Intravascular Ultrasound/IVUS;  Surgeon: Wellington Hampshire, MD;  Location: [place] [facility];  Service: Cardiovascular;  Laterality: N/A;   KNEE SURGERY Right age 14   LEFT HEART CATH AND CORONARY ANGIOGRAPHY N/A 07/18/2017   Procedure: LEFT HEART CATH AND CORONARY ANGIOGRAPHY;  Surgeon: Wellington Hampshire, MD;  Location: [place] [facility];  Service: Cardiovascular;  Laterality: N/A;   LEFT HEART CATH AND CORONARY ANGIOGRAPHY Left 08/19/2017   Procedure: LEFT HEART CATH AND CORONARY ANGIOGRAPHY;  Surgeon: Wellington Hampshire, MD;  Location: [place] [facility];  Service: Cardiovascular;  Laterality: Left;   LEFT HEART CATH AND CORONARY ANGIOGRAPHY N/A 11/07/2017   Procedure: LEFT HEART CATH AND CORONARY  ANGIOGRAPHY;  Surgeon: Wellington Hampshire, MD;  Location: [place] [facility];  Service: Cardiovascular;  Laterality: N/A;   LEFT HEART CATH AND CORONARY ANGIOGRAPHY N/A 10/17/2021   Procedure: LEFT HEART CATH AND CORONARY ANGIOGRAPHY;  Surgeon: Wellington Hampshire, MD;  Location: [place] [facility];  Service: Cardiovascular;  Laterality: N/A;   ROTATOR CUFF REPAIR Right 2013    Social History   Tobacco Use   Smoking status: Former    Types: Cigarettes    Quit date: 07/24/2007    Years since quitting: 15.1    Passive exposure: Past   Smokeless tobacco: Former    Types: Chew   Tobacco comments:    smoking cessation materials not required  Vaping Use   Vaping Use: Never used  Substance Use Topics   Alcohol use: Yes    Alcohol/week: 5.0 - 6.0 standard drinks of alcohol    Types: 5 - 6 Cans of beer per week    Comment:  weekly   Drug use: No     Medication list has been reviewed and updated.  Current Meds  Medication Sig   amLODipine (NORVASC) 5 MG tablet Take  0.5 tablets (2.5 mg total) by mouth every evening.   aspirin EC 81 MG tablet Take 81 mg by mouth daily.    carvedilol (COREG) 3.125 MG tablet TAKE 1 TABLET TWICE DAILY WITH MEALS   clopidogrel (PLAVIX) 75 MG tablet Take 75 mg by mouth daily.   ezetimibe (ZETIA) 10 MG tablet Take 1 tablet (10 mg total) by mouth daily.   furosemide (LASIX) 20 MG tablet Take 20 mg by mouth.   gabapentin (NEURONTIN) 300 MG capsule Take 300 mg by mouth 2 (two) times daily.   nitroGLYCERIN (NITROSTAT) 0.4 MG SL tablet Place 1 tablet (0.4 mg total) under the tongue every 5 (five) minutes as needed for chest pain.   oxybutynin (DITROPAN-XL) 10 MG 24 hr tablet Take 10 mg by mouth at bedtime.   potassium chloride (KLOR-CON) 10 MEQ tablet Take 10 mEq by mouth daily.   rosuvastatin (CRESTOR) 40 MG tablet Take 1 tablet (40 mg total) by mouth daily. (Patient taking differently: Take 40 mg by mouth at bedtime.)   sacubitril-valsartan (ENTRESTO) 49-51 MG  Take 1 tablet by mouth 2 (two) times daily.   spironolactone (ALDACTONE) 25 MG tablet Take 1 tablet (25 mg total) by mouth daily.   tamsulosin (FLOMAX) 0.4 MG CAPS capsule Take 1 capsule (0.4 mg total) by mouth daily.       09/07/2022    2:59 PM 06/18/2022    2:44 PM 04/17/2022    8:50 AM 12/28/2021    9:21 AM  GAD 7 : Generalized Anxiety Score  Nervous, Anxious, on Edge 0 1 1 0  Control/stop worrying 0 0 1 0  Worry too much - different things 0 0 1 0  Trouble relaxing 1 1 2 1  $ Restless 1 1 2 1  $ Easily annoyed or irritable 1 1 1 $ 0  Afraid - awful might happen 0 0 0 0  Total GAD 7 Score 3 4 8 2  $ Anxiety Difficulty Not difficult at all Not difficult at all Not difficult at all Not difficult at all       09/07/2022    2:59 PM 06/18/2022    2:44 PM 04/17/2022    8:50 AM  Depression screen PHQ 2/9  Decreased Interest 0 1 1  Down, Depressed, Hopeless 1 1 0  PHQ - 2 Score 1 2 1  $ Altered sleeping 2 2 3  $ Tired, decreased energy 1 1 1  $ Change in appetite 1 1 0  Feeling bad or failure about yourself  0 0 0  Trouble concentrating 0 1 2  Moving slowly or fidgety/restless 0 0 1  Suicidal thoughts 0 0 0  PHQ-9 Score 5 7 8  $ Difficult doing work/chores Not difficult at all Not difficult at all Not difficult at all    BP Readings from Last 3 Encounters:  09/07/22 136/64  09/06/22 130/68  08/27/22 118/62    Physical Exam Vitals and nursing note reviewed.  Constitutional:      General: He is not in acute distress.    Appearance: He is well-developed.  HENT:     Head: Normocephalic and atraumatic.  Cardiovascular:     Rate and Rhythm: Normal rate and regular rhythm.  Pulmonary:     Effort: Pulmonary effort is normal. No respiratory distress.     Breath sounds: No wheezing or rhonchi.  Musculoskeletal:     Comments: Triggering right middle finger with some tenderness of the palm over the tendon sheath  Skin:    General: Skin is warm and  dry.     Findings: No rash.  Neurological:      Mental Status: He is alert and oriented to person, place, and time.  Psychiatric:        Mood and Affect: Mood normal.        Behavior: Behavior normal.     Wt Readings from Last 3 Encounters:  09/07/22 175 lb (79.4 kg)  09/06/22 173 lb 9.6 oz (78.7 kg)  08/27/22 178 lb 2 oz (80.8 kg)    BP 136/64   Pulse 82   Ht 5' 7"$  (1.702 m)   Wt 175 lb (79.4 kg)   SpO2 97%   BMI 27.41 kg/m   Assessment and Plan: Problem List Items Addressed This Visit       Hematopoietic and Hemostatic   Thrombophilia ([place]) (Chronic)    On Eliquis for paroxysmal Afib No bleeding issues      Other Visit Diagnoses     Trigger middle finger of right hand    -  Primary   Recurrent symptoms will refer to [facility] for injection   Relevant Orders   Ambulatory referral to Orthopedic Surgery        Partially dictated using Dragon software. Any errors are unintentional.  Halina Maidens, MD [facility]  09/07/2022

## 2022-09-07 NOTE — Assessment & Plan Note (Signed)
On Eliquis for paroxysmal Afib No bleeding issues

## 2022-09-19 DIAGNOSIS — M65331 Trigger finger, right middle finger: Secondary | ICD-10-CM | POA: Diagnosis not present

## 2022-09-21 ENCOUNTER — Ambulatory Visit: Payer: Medicare HMO | Admitting: Physician Assistant

## 2022-10-02 ENCOUNTER — Other Ambulatory Visit: Payer: Self-pay

## 2022-10-02 ENCOUNTER — Encounter: Payer: Medicare HMO | Admitting: Neurosurgery

## 2022-10-02 DIAGNOSIS — Z981 Arthrodesis status: Secondary | ICD-10-CM

## 2022-10-04 ENCOUNTER — Encounter: Payer: Self-pay | Admitting: Neurosurgery

## 2022-10-04 ENCOUNTER — Ambulatory Visit
Admission: RE | Admit: 2022-10-04 | Discharge: 2022-10-04 | Disposition: A | Discharge status: Home / Self Care | Payer: Medicare HMO | Source: Ambulatory Visit | Attending: Neurosurgery | Admitting: Neurosurgery

## 2022-10-04 ENCOUNTER — Ambulatory Visit (INDEPENDENT_AMBULATORY_CARE_PROVIDER_SITE_OTHER): Payer: Medicare HMO | Admitting: Neurosurgery

## 2022-10-04 ENCOUNTER — Ambulatory Visit
Admission: RE | Admit: 2022-10-04 | Discharge: 2022-10-04 | Disposition: A | Discharge status: Home / Self Care | Payer: Medicare HMO | Attending: Neurosurgery | Admitting: Neurosurgery

## 2022-10-04 VITALS — BP 115/68 | Temp 98.1°F | Ht 67.0 in | Wt 175.0 lb

## 2022-10-04 DIAGNOSIS — M4802 Spinal stenosis, cervical region: Secondary | ICD-10-CM

## 2022-10-04 DIAGNOSIS — Z981 Arthrodesis status: Secondary | ICD-10-CM

## 2022-10-04 DIAGNOSIS — G959 Disease of spinal cord, unspecified: Secondary | ICD-10-CM

## 2022-10-04 DIAGNOSIS — Z09 Encounter for follow-up examination after completed treatment for conditions other than malignant neoplasm: Secondary | ICD-10-CM

## 2022-10-05 NOTE — Progress Notes (Signed)
   REFERRING PHYSICIAN:  Meade Maw, Md [address] [place],  [place] [address]  DOS: 08/22/22 C4-6 ACDF   HISTORY OF PRESENT ILLNESS: Todd Weaver is status post ACDF. Overall, he is doing well postoperatively.  He has substantially improved compared to before surgery.    PHYSICAL EXAMINATION:  NEUROLOGICAL:  General: In no acute distress.   Awake, alert, oriented to person, place, and time.  Pupils equal round and reactive to light.  Facial tone is symmetric.  Tongue protrusion is midline.  There is no pronator drift.  Strength: Side Biceps Triceps Deltoid Interossei Grip Wrist Ext. Wrist Flex.  R 5 5 5 5 5 5 5   L 5 5 5 5 5 5 5    Incision c/d/I  Imaging:  No complications  Assessment / Plan: Todd Weaver is doing well after C4-6 ACDF.   He is doing very well.  I have given him exercises for his neck.  I will see him back in 6 weeks with x-rays.  Meade Maw MD Dept of Neurosurgery

## 2022-10-09 ENCOUNTER — Telehealth: Payer: Self-pay | Admitting: Cardiovascular Disease

## 2022-10-09 MED ORDER — ENTRESTO 49-51 MG PO TABS: 1.0000 | ORAL_TABLET | Freq: Two times a day (BID) | ORAL | 10 refills | Status: DC

## 2022-10-09 MED ORDER — ENTRESTO 49-51 MG PO TABS: 1.0000 | ORAL_TABLET | Freq: Two times a day (BID) | ORAL | 2 refills | Status: DC

## 2022-10-09 NOTE — Telephone Encounter (Signed)
Requested Prescriptions   Signed Prescriptions Disp Refills   sacubitril-valsartan (ENTRESTO) 49-51 MG 90 tablet 2    Sig: Take 1 tablet by mouth 2 (two) times daily.    Authorizing Provider: Minna Merritts    Ordering User: Raelene Bott, Josely Moffat L

## 2022-10-09 NOTE — Addendum Note (Signed)
Addended by: Raelene Bott, Arash Karstens L on: 10/09/2022 02:11 PM   Modules accepted: Orders

## 2022-10-09 NOTE — Telephone Encounter (Signed)
*  STAT* If patient is at the pharmacy, call can be transferred to refill team.   1. Which medications need to be refilled? (please list name of each medication and dose if known) sacubitril-valsartan (ENTRESTO) 49-51 MG   2. Which pharmacy/location (including street and city if local pharmacy) is medication to be sent to?   [facility] [place], [place] [address] AT [place] Phone: [phone]  Fax: [phone]    3. Do they need a 30 day or 90 day supply? [place]

## 2022-10-15 ENCOUNTER — Other Ambulatory Visit: Payer: Self-pay | Admitting: Urology

## 2022-10-30 ENCOUNTER — Encounter: Payer: Medicare HMO | Admitting: Neurosurgery

## 2022-11-14 ENCOUNTER — Other Ambulatory Visit: Payer: Self-pay

## 2022-11-14 DIAGNOSIS — G959 Disease of spinal cord, unspecified: Secondary | ICD-10-CM

## 2022-11-15 ENCOUNTER — Ambulatory Visit
Admission: RE | Admit: 2022-11-15 | Discharge: 2022-11-15 | Disposition: A | Discharge status: Home / Self Care | Payer: Medicare HMO | Source: Ambulatory Visit | Attending: Neurosurgery | Admitting: Neurosurgery

## 2022-11-15 ENCOUNTER — Ambulatory Visit
Admission: RE | Admit: 2022-11-15 | Discharge: 2022-11-15 | Disposition: A | Discharge status: Home / Self Care | Payer: Medicare HMO | Attending: Neurosurgery | Admitting: Neurosurgery

## 2022-11-15 ENCOUNTER — Encounter: Payer: Self-pay | Admitting: Neurosurgery

## 2022-11-15 ENCOUNTER — Ambulatory Visit (INDEPENDENT_AMBULATORY_CARE_PROVIDER_SITE_OTHER): Payer: Medicare HMO | Admitting: Neurosurgery

## 2022-11-15 ENCOUNTER — Telehealth: Payer: Self-pay | Admitting: Cardiovascular Disease

## 2022-11-15 VITALS — BP 123/73 | HR 64 | Temp 98.1°F | Wt 176.6 lb

## 2022-11-15 DIAGNOSIS — M4802 Spinal stenosis, cervical region: Secondary | ICD-10-CM

## 2022-11-15 DIAGNOSIS — M50323 Other cervical disc degeneration at C6-C7 level: Secondary | ICD-10-CM | POA: Diagnosis not present

## 2022-11-15 DIAGNOSIS — G959 Disease of spinal cord, unspecified: Secondary | ICD-10-CM | POA: Insufficient documentation

## 2022-11-15 DIAGNOSIS — M5412 Radiculopathy, cervical region: Secondary | ICD-10-CM

## 2022-11-15 DIAGNOSIS — Z09 Encounter for follow-up examination after completed treatment for conditions other than malignant neoplasm: Secondary | ICD-10-CM

## 2022-11-15 DIAGNOSIS — Z981 Arthrodesis status: Secondary | ICD-10-CM

## 2022-11-15 NOTE — Progress Notes (Signed)
   REFERRING PHYSICIAN:  Venetia Night, Md [address] [place],  [place] [address]  DOS: 08/22/22 C4-6 ACDF  HISTORY OF PRESENT ILLNESS: Todd Weaver is about 3 months status post ACDF. Overall, he is doing well postoperatively.  He does note about a week or 2 of recurrent left rating arm pain with associated numbness into his fingers.  The numbness seems to spare his pinky.  He states that this is similar to the pain he had before surgery.  He admits that he has been lifting more than he is supposed to, up to 40 pounds.  In addition to this he has stopped his gabapentin about 2 weeks ago which is about when his pain recurred.  He denies any similar right-sided symptoms.  He denies any weakness.  PHYSICAL EXAMINATION:  NEUROLOGICAL:  General: In no acute distress.   Awake, alert, oriented to person, place, and time.  Pupils equal round and reactive to light.  Facial tone is symmetric.  Tongue protrusion is midline.  There is no pronator drift.  Strength: Side Biceps Triceps Deltoid Interossei Grip Wrist Ext. Wrist Flex.  R L Incision c/d/I  Imaging:  11/15/22 cervical xrays There is no evidence of hardware malfunction.  Assessment / Plan: NAITHEN RIVENBURG is doing fairly well after his recent ACDF despite a week or 2 of recurrent symptoms.  Given that he has abruptly stopped his gabapentin, I recommended that he resume 300 mg of gabapentin at night.  Should his symptoms fail to improve, we discussed potentially updating an MRI.  He will let us know how he is doing by this time next week and we will make a decision about how to move forward.  he will otherwise return to clinic in approximately 2 months with repeat cervical x-rays or sooner should he have any questions or concerns.    Advised to contact the office if any questions or concerns arise.   Manning Charity PA-C Dept of Neurosurgery    I spent a total of 20 minutes in  both face-to-face and non-face-to-face activities for this visit on the date of this encounter.

## 2022-11-15 NOTE — Telephone Encounter (Signed)
Pt dropped off [employer] Patient Assistance Forms. Placed in nurse box.

## 2022-11-19 ENCOUNTER — Other Ambulatory Visit: Payer: Self-pay | Admitting: *Deleted

## 2022-11-19 MED ORDER — ENTRESTO 49-51 MG PO TABS: 1.0000 | ORAL_TABLET | Freq: Two times a day (BID) | ORAL | 3 refills | Status: DC

## 2022-11-20 NOTE — Telephone Encounter (Signed)
Forms placed in Baring Dunn's box as that was the provider listed on the forms.

## 2022-12-03 ENCOUNTER — Telehealth: Payer: Self-pay | Admitting: Neurosurgery

## 2022-12-03 NOTE — Telephone Encounter (Signed)
C4-6 ACDF on 08/22/22//xrays today  Patient seen you on 11/15/22 for his postop appt and he told you that he was still having the same symptoms like before surgery. He is calling that the symptoms have gotten worse. Can you order a MRI.

## 2022-12-04 ENCOUNTER — Other Ambulatory Visit: Payer: Self-pay | Admitting: Neurosurgery

## 2022-12-04 DIAGNOSIS — M5412 Radiculopathy, cervical region: Secondary | ICD-10-CM

## 2022-12-04 DIAGNOSIS — H524 Presbyopia: Secondary | ICD-10-CM | POA: Diagnosis not present

## 2022-12-04 DIAGNOSIS — Z981 Arthrodesis status: Secondary | ICD-10-CM

## 2022-12-04 DIAGNOSIS — Z0100 Encounter for examination of eyes and vision without abnormal findings: Secondary | ICD-10-CM | POA: Diagnosis not present

## 2022-12-04 NOTE — Telephone Encounter (Signed)
Left message to call back  

## 2022-12-05 ENCOUNTER — Other Ambulatory Visit: Payer: Self-pay | Admitting: Internal Medicine

## 2022-12-05 NOTE — Telephone Encounter (Signed)
Requested medication (s) are due for refill today:   Not sure  Requested medication (s) are on the active medication list:   Listed as historical  Future visit scheduled:   Yes   Last ordered: 07/12/2022  Returned because this is listed as a historical med on her med list but looks like it was prescribed 07/12/2022.    Requested Prescriptions  Pending Prescriptions Disp Refills   potassium chloride (KLOR-CON M) 10 MEQ tablet [Pharmacy Med Name: POTASSIUM CHLORIDE ER 10 MEQ Tablet Extended Release] 90 tablet 3    Sig: TAKE 1 TABLET EVERY DAY     Endocrinology:  Minerals - Potassium Supplementation Passed - 12/05/2022  3:04 AM      Passed - K in normal range and within 360 days    Potassium  Date Value Ref Range Status  08/15/2022 4.8 3.5 - 5.1 mmol/L Final         Passed - Cr in normal range and within 360 days    Creatinine, Ser  Date Value Ref Range Status  08/15/2022 1.14 0.61 - 1.24 mg/dL Final         Passed - Valid encounter within last 12 months    Recent Outpatient Visits           2 months ago Trigger middle finger of right hand   [place] [facility] at [facility], Nyoka Cowden, MD   5 months ago Primary insomnia   [place] [facility] at [facility], Nyoka Cowden, MD   7 months ago Annual physical exam   [facility] at [facility], Nyoka Cowden, MD   11 months ago Carpal tunnel syndrome, bilateral   [place] [facility] at [facility], Nyoka Cowden, MD   1 year ago Essential hypertension   [facility] at [facility], Nyoka Cowden, MD       Future Appointments             In 1 week Richardo Hanks, Laurette Schimke, MD [facility] [place]   In 4 months Judithann Graves Nyoka Cowden, MD [facility] Primary [facility] & Sports Medicine at [facility], [facility]

## 2022-12-05 NOTE — Telephone Encounter (Signed)
I spoke to the patient and transferred his call to the imaging department.

## 2022-12-06 ENCOUNTER — Telehealth: Payer: Self-pay | Admitting: Internal Medicine

## 2022-12-06 ENCOUNTER — Encounter: Payer: Medicare HMO | Admitting: Neurosurgery

## 2022-12-06 NOTE — Telephone Encounter (Signed)
Copied from CRM [id number]. Topic: Medicare AWV >> Dec 06, 2022 10:34 AM Payton Doughty wrote: Reason for CRM: Called patient to schedule Medicare Annual Wellness Visit (AWV). Left message for patient to call back and schedule Medicare Annual Wellness Visit (AWV).  Last date of AWV: 12/20/21  Please schedule an appointment at any time with Kennedy Bucker, LPN  .  If any questions, please contact me.  Thank you ,  Verlee Rossetti; Care Guide Ambulatory Clinical Support [place] [facility] Direct Dial: [phone]

## 2022-12-06 NOTE — Telephone Encounter (Signed)
Contacted Ardell Isaacs to schedule their annual wellness visit. Appointment made for 12/27/2022.  Verlee Rossetti; Care Guide Ambulatory Clinical Support [place] [facility] Direct Dial: [phone]

## 2022-12-06 NOTE — Telephone Encounter (Signed)
Copied from CRM [id number]. Topic: Medicare AWV >> Dec 06, 2022 10:37 AM Payton Doughty wrote: Reason for CRM: Called patient to schedule Medicare Annual Wellness Visit (AWV). No voicemail available to leave a message.  Last date of AWV: 12/20/21  Please schedule an appointment at any time with Kennedy Bucker, LPN  .  If any questions, please contact me.  Thank you ,  Verlee Rossetti; Care Guide Ambulatory Clinical Support [place] [facility] Direct Dial: [phone]

## 2022-12-12 ENCOUNTER — Ambulatory Visit
Admission: RE | Admit: 2022-12-12 | Discharge: 2022-12-12 | Disposition: A | Discharge status: Home / Self Care | Payer: Medicare HMO | Source: Ambulatory Visit | Attending: Neurosurgery | Admitting: Neurosurgery

## 2022-12-12 DIAGNOSIS — Z981 Arthrodesis status: Secondary | ICD-10-CM | POA: Insufficient documentation

## 2022-12-12 DIAGNOSIS — M4802 Spinal stenosis, cervical region: Secondary | ICD-10-CM | POA: Diagnosis not present

## 2022-12-12 DIAGNOSIS — M5412 Radiculopathy, cervical region: Secondary | ICD-10-CM | POA: Diagnosis not present

## 2022-12-14 ENCOUNTER — Other Ambulatory Visit: Payer: Self-pay

## 2022-12-14 DIAGNOSIS — N401 Enlarged prostate with lower urinary tract symptoms: Secondary | ICD-10-CM

## 2022-12-18 ENCOUNTER — Ambulatory Visit: Payer: Medicare HMO | Admitting: Urology

## 2022-12-18 ENCOUNTER — Encounter: Payer: Self-pay | Admitting: Urology

## 2022-12-18 ENCOUNTER — Ambulatory Visit (INDEPENDENT_AMBULATORY_CARE_PROVIDER_SITE_OTHER): Payer: Medicare HMO | Admitting: Neurosurgery

## 2022-12-18 VITALS — BP 117/69 | HR 64 | Ht 67.0 in | Wt 175.4 lb

## 2022-12-18 DIAGNOSIS — N401 Enlarged prostate with lower urinary tract symptoms: Secondary | ICD-10-CM

## 2022-12-18 DIAGNOSIS — R351 Nocturia: Secondary | ICD-10-CM | POA: Diagnosis not present

## 2022-12-18 DIAGNOSIS — N138 Other obstructive and reflux uropathy: Secondary | ICD-10-CM

## 2022-12-18 DIAGNOSIS — Z125 Encounter for screening for malignant neoplasm of prostate: Secondary | ICD-10-CM

## 2022-12-18 DIAGNOSIS — M5412 Radiculopathy, cervical region: Secondary | ICD-10-CM

## 2022-12-18 LAB — BLADDER SCAN AMB NON-IMAGING

## 2022-12-18 MED ORDER — TAMSULOSIN HCL 0.4 MG PO CAPS: 0.4000 mg | ORAL_CAPSULE | Freq: Every day | ORAL | 3 refills | Status: DC

## 2022-12-18 MED ORDER — OXYBUTYNIN CHLORIDE ER 10 MG PO TB24: 10.0000 mg | ORAL_TABLET | Freq: Every day | ORAL | 3 refills | Status: DC

## 2022-12-18 NOTE — Progress Notes (Signed)
Neurosurgery Telephone (Audio-Only) Note  Requesting Provider     Reubin Milan, MD [address] [place],  [place] [address] T: [phone] F: [phone]  Primary Care Provider Reubin Milan, MD [address] [place] [address] T: [phone] F: [phone]  Telehealth visit was conducted with Ardell Isaacs, a 72 y.o. male via telephone.  History of Present Illness: Mr. Belzer is a 72 year old presenting today via telephone visit for review of his MRI.  He continues to have left radiating arm pain with associated numbness into his fingers that seems to spare his pinky.  He states this is similar to the pain he had before surgery and has worsened since his visit with me last month.  He continues to deny any right-sided symptoms.  General Review of Systems:  A ROS was performed including pertinent positive and negatives as documented.  All other systems are negative.   Prior to Admission medications   Medication Sig Start Date End Date Taking? Authorizing Provider  amLODipine (NORVASC) 5 MG tablet Take 0.5 tablets (2.5 mg total) by mouth every evening. 08/27/22   Antonieta Iba, MD  aspirin EC 81 MG tablet Take 81 mg by mouth daily.  05/23/09   [provider]  carvedilol (COREG) 3.125 MG tablet TAKE 1 TABLET TWICE DAILY WITH MEALS 05/15/22   Antonieta Iba, MD  clopidogrel (PLAVIX) 75 MG tablet Take 75 mg by mouth daily.    [provider]  clotrimazole-betamethasone (LOTRISONE) cream Apply 1 Application topically 2 (two) times daily. Patient taking differently: Apply 1 Application topically as needed. 04/17/22   Reubin Milan, MD  ezetimibe (ZETIA) 10 MG tablet Take 1 tablet (10 mg total) by mouth daily. 06/18/22   Reubin Milan, MD  furosemide (LASIX) 20 MG tablet Take 20 mg by mouth.    [provider]  gabapentin (NEURONTIN) 300 MG capsule Take 300 mg by mouth 2 (two) times daily.    [provider]  nitroGLYCERIN (NITROSTAT) 0.4 MG SL tablet Place 1 tablet (0.4 mg total) under the tongue every 5 (five) minutes as needed for chest pain. 06/18/22   Reubin Milan, MD  oxybutynin (DITROPAN-XL) 10 MG 24 hr tablet Take 1 tablet (10 mg total) by mouth at bedtime. 12/18/22   Sondra Come, MD  potassium chloride (KLOR-CON) 10 MEQ tablet Take 10 mEq by mouth daily.    [provider]  rosuvastatin (CRESTOR) 40 MG tablet Take 1 tablet (40 mg total) by mouth daily. Patient taking differently: Take 40 mg by mouth at bedtime. 04/19/22   Reubin Milan, MD  sacubitril-valsartan (ENTRESTO) 49-51 MG Take 1 tablet by mouth 2 (two) times daily. 11/19/22   Antonieta Iba, MD  spironolactone (ALDACTONE) 25 MG tablet Take 1 tablet (25 mg total) by mouth daily. 05/18/22 09/07/22  Sondra Barges, PA-C  tamsulosin (FLOMAX) 0.4 MG CAPS capsule Take 1 capsule (0.4 mg total) by mouth daily. 12/18/22   Sondra Come, MD    DATA REVIEWED    Imaging Studies  12/12/22 MRI C spine IMPRESSION: 1. Postoperative changes from interval ACDF at C4-5 and C5-6 without residual or recurrent spinal stenosis. Residual severe right worse than left C6 foraminal narrowing related to uncovertebral disease. 2. Otherwise stable appearance of the cervical spine with mild noncompressive disc bulging at C3-4 and C6-7 without significant spinal stenosis. Mild to moderate right C7 and bilateral C8 foraminal narrowing related to disc bulge and  uncovertebral disease.     Electronically Signed   By: Rise Mu M.D.   On: 12/17/2022 17:25  IMPRESSION  Mr. Emerine is a 72 y.o. male who I performed a telephone encounter today for evaluation and management of recurrent cervical radiculopathy after cervical fusion PLAN  Number is a pleasant 72 year old presenting with recurrent symptoms concerning for cervical radiculopathy.  His MRI does show residual bilateral foraminal stenosis at C6 although  worse on the right than the left despite the fact that he is only having left-sided symptoms.  This could certainly be the cause of his recurrent pain.  We discussed his treatment options to include increasing his gabapentin, considering cervical injection, or discussing possible surgical options with Dr. Marcell Barlow.  He opted to move forward with cervical ESI's.  I have placed a referral to Dr. Yves Dill for this.  He has a follow-up in July with Dr. Myer Haff which I encouraged him to keep.  Should he fail to have relief from an injection, we will discuss further options prior to his appointment Dr. Marcell Barlow.  Orders Placed This Encounter  Procedures   Ambulatory referral to Physicial Medicine Rehab    DISPOSITION  Follow up: In person appointment in 2 months  Cressie Betzler Anders Simmonds, PA   TELEPHONE DOCUMENTATION  This visit was performed via telephone.  Patient location: home Provider location: office  I spent a total of 5 minutes non-face-to-face activities for this visit on the date of this encounter including review of current clinical condition and response to treatment.  The patient is aware of and accepts the limits of this telehealth visit.

## 2022-12-18 NOTE — Telephone Encounter (Signed)
He confirmed to be available for your phone call today at 3pm.

## 2022-12-18 NOTE — Progress Notes (Signed)
   12/18/2022 10:11 AM   Todd Weaver 1950-12-05 [id number]  Reason for visit: Follow up overactive bladder, nocturia, BPH, PSA screening  HPI: 72 year old male with extensive cardiac history and primary urinary complaints of frequency, urgency, nocturia 3-4 times per night.  PVRs have been normal, including normal at 34 mL today.  PSA was normal at 2.0 in September 2023, and urinalysis was benign.  He is currently on both Flomax and oxybutynin, and feels that his symptoms are well controlled on these medications.  He denies significant urinary complaints during the day, and urgency/frequency has improved.  He has nocturia 2-3 times overnight, but he is drinking 2-3 beers in the evening before bed.  We had a long conversation about behavioral strategies.  He denies any lower extremity edema.  He does snore, but has not been tested for sleep apnea, and I encouraged him to be evaluated.  We reviewed the connection between sleep apnea and nocturia.  I had recommended sleep apnea evaluation last year, but this was never completed.  We again reviewed the AUA guidelines regarding PSA screening every 1 to 2 years through age 75.  Especially with his significant cardiac history, I would recommend discontinuing screening at age 89 per the guideline recommendations.  -Continue Flomax and oxybutynin, refilled -Referral placed for sleep apnea evaluation -RTC 1 year PVR  Sondra Come, MD  [facility] [address] [place], [place] [address] [phone]

## 2022-12-18 NOTE — Patient Instructions (Signed)

## 2022-12-18 NOTE — Telephone Encounter (Signed)
His MRI results are in his chart. Do you want to set up a visit?

## 2022-12-25 ENCOUNTER — Telehealth: Payer: Self-pay

## 2022-12-25 DIAGNOSIS — R351 Nocturia: Secondary | ICD-10-CM

## 2022-12-25 DIAGNOSIS — R0683 Snoring: Secondary | ICD-10-CM

## 2022-12-25 NOTE — Telephone Encounter (Signed)
-----   Message from Sondra Come, MD sent at 12/25/2022  2:49 PM EDT ----- Regarding: Sleep study change Can you help with this? ----- Message ----- From: Maryan Puls Sent: 12/25/2022   2:33 PM EDT To: Sondra Come, MD

## 2022-12-27 ENCOUNTER — Ambulatory Visit (INDEPENDENT_AMBULATORY_CARE_PROVIDER_SITE_OTHER): Payer: Medicare HMO

## 2022-12-27 VITALS — BP 100/60 | Ht 67.0 in | Wt 174.6 lb

## 2022-12-27 DIAGNOSIS — Z0000 Encounter for general adult medical examination without abnormal findings: Secondary | ICD-10-CM | POA: Diagnosis not present

## 2022-12-27 NOTE — Progress Notes (Signed)
Subjective:   Todd Weaver is a 72 y.o. male who presents for Medicare Annual/Subsequent preventive examination.  Review of Systems     Cardiac Risk Factors include: advanced age (>81men, >52 women);dyslipidemia;hypertension;male gender;sedentary lifestyle     Objective:    Today's Vitals   12/27/22 0915  BP: 100/60  Weight: 174 lb 9.6 oz (79.2 kg)  Height: 5\' 7"  (1.702 m)   Body mass index is 27.35 kg/m.     12/27/2022    9:25 AM 08/22/2022    6:33 AM 08/15/2022   11:17 AM 07/12/2022    8:13 PM 12/20/2021    1:42 PM 12/12/2020    1:43 PM 12/05/2020   11:53 AM  Advanced Directives  Does Patient Have a Medical Advance Directive? No No No No No No No  Would patient like information on creating a medical advance directive? No - Patient declined No - Patient declined  No - Patient declined Yes (MAU/Ambulatory/Procedural Areas - Information given) Yes (MAU/Ambulatory/Procedural Areas - Information given) No - Patient declined    Current Medications (verified) Outpatient Encounter Medications as of 12/27/2022  Medication Sig   amLODipine (NORVASC) 5 MG tablet Take 0.5 tablets (2.5 mg total) by mouth every evening.   aspirin EC 81 MG tablet Take 81 mg by mouth daily.    carvedilol (COREG) 3.125 MG tablet TAKE 1 TABLET TWICE DAILY WITH MEALS   clopidogrel (PLAVIX) 75 MG tablet Take 75 mg by mouth daily.   clotrimazole-betamethasone (LOTRISONE) cream Apply 1 Application topically 2 (two) times daily. (Patient taking differently: Apply 1 Application topically as needed.)   ezetimibe (ZETIA) 10 MG tablet Take 1 tablet (10 mg total) by mouth daily.   gabapentin (NEURONTIN) 300 MG capsule Take 300 mg by mouth 2 (two) times daily.   nitroGLYCERIN (NITROSTAT) 0.4 MG SL tablet Place 1 tablet (0.4 mg total) under the tongue every 5 (five) minutes as needed for chest pain.   oxybutynin (DITROPAN-XL) 10 MG 24 hr tablet Take 1 tablet (10 mg total) by mouth at bedtime.   rosuvastatin (CRESTOR)  40 MG tablet Take 1 tablet (40 mg total) by mouth daily. (Patient taking differently: Take 40 mg by mouth at bedtime.)   sacubitril-valsartan (ENTRESTO) 49-51 MG Take 1 tablet by mouth 2 (two) times daily.   tamsulosin (FLOMAX) 0.4 MG CAPS capsule Take 1 capsule (0.4 mg total) by mouth daily.   furosemide (LASIX) 20 MG tablet Take 20 mg by mouth. (Patient not taking: Reported on 12/27/2022)   potassium chloride (KLOR-CON) 10 MEQ tablet Take 10 mEq by mouth daily. (Patient not taking: Reported on 12/27/2022)   spironolactone (ALDACTONE) 25 MG tablet Take 1 tablet (25 mg total) by mouth daily.   No facility-administered encounter medications on file as of 12/27/2022.    Allergies (verified) Codeine, Shellfish allergy, Contrast media [iodinated contrast media], and Fish allergy   History: Past Medical History:  Diagnosis Date   Acute ST elevation myocardial infarction (STEMI) of inferior wall (HCC) 07/18/2017   a.) LHC/PCI 07/18/2017: 99% o-mRCA --> overlapping 3.0 x 38 mm and 3.25 x 18 mm Xience Sierra DES   Alcohol dependence, daily use (HCC)    a.) 3-4 beers daily   Angina pectoris (HCC) 11/06/2017   BPH (benign prostatic hyperplasia) 06/19/2017   Cardiac arrest with ventricular fibrillation (HCC) 07/18/2017   a.) in setting of acute inferior STEMI; required defib in field by EMS and again in the ED   Carpal tunnel syndrome of right wrist 12/26/2020  Cervical spinal stenosis    Chronic deep vein thrombosis (DVT) of brachial vein of left upper extremity (HCC)    Chronic systolic congestive heart failure (HCC) 09/29/2019   a.) TTE 09/29/2019: EF 45-50%, glob HK, mild MR, mild PA dil, G2DD; b.) TTE 11/28/2021: EF 45-50%, glob HK, G2DD; c.) TTE 04/18/2022: EF 45-50%, glob HK, G2DD   Coronary artery disease of native artery of native heart with stable angina pectoris (HCC) 07/18/2017   a.) LHC/PCI 07/18/17: 99 o-mRCA (overlapping 3.0x67mm & 3.25x39mm Xience Sierra DES), 60 post atrio, 85  dLM-oLAD, 40 pLCx, 60 pLAD, 40 mLAD; b.) LHC 08/19/17: 85 dLM-oLAD (PTCA), 60 pLAD, 30 mLAD, 40 pLCx, 50 RI; c.) LHC/PCI 09/09/17: unchanged LHC. 3.0 x 28mm Xience Sierra DES dLM-oLAD; d.) LHC 11/07/17: 30 mLM, 40 pLCx, 70 RI, 40 dLAD - Rx mgmt; e.) LHC 10/17/21: 70 oRI, 20 mLAD, 30 pLCx - Rx mgmt   ED (erectile dysfunction) 06/19/2017   a.) on PDE5i (sildenafil)   Essential hypertension 06/19/2017   GERD (gastroesophageal reflux disease)    Hand dermatitis 07/26/2014   Hemorrhoid    Hyperlipidemia    Hypertension    Impingement syndrome of left shoulder region 10/14/2018   Ischemic cardiomyopathy    a.) TTE 07/18/2017: EF 45-50%, mod inf/infsept HK, mild LAE, LVH; b.) LHC 08/19/2017: EF 45%; c.) TTE 09/29/2019: EF 45-50%, glob HK, G2DD; d.) MPI 01/18/2021: EF 47%; e.) TTE 11/28/2021: EF 45-50%, glob HK, G2DD; f.) TTE 04/18/2022:  EF 45-50%, glob HK, G2DD   Long term current use of antithrombotics/antiplatelets    a.) on DAPT (ASA + clopidogrel)   Lumbar spondylosis with myelopathy 11/08/2016   MCI (mild cognitive impairment) 06/19/2017   Myelomalacia of cervical cord (HCC)    Overweight (BMI 25.0-29.9) 06/19/2017   PAD (peripheral artery disease) (HCC)    a. 01/2019 ABIs and Duplex: ABI R 0.78, L 0.79; TBI R 0.66, L 1.11. RCIA >50p, RSFA 50-74. LCIA >50p, LSFA 75-99p, 30-49d. 3 vessel runoff bilat.   PAF (paroxysmal atrial fibrillation) (HCC)    a. Brief episode during December hospitalization-->converted on IV amio-->not on OAC.   Rectal polyp    Stroke (HCC) 09/26/2005   a.) seen acutely on brain MRI 09/26/2005 --> posterior frontal, mid parietal lobe, RIGHT occipital lobe --> tiny ischemic infarctions secondary to a shower of emboli  (watershed ischemic pattern may be present)   Thrombophilia (HCC) 09/29/2020   Urinary hesitancy 12/26/2020   Past Surgical History:  Procedure Laterality Date   ANAL FISTULECTOMY N/A 12/21/2014   Procedure: FISTULECTOMY ANAL;  Surgeon: Kieth Brightly, MD;  Location: [facility] ORS;  Service: General;  Laterality: N/A;   ANTERIOR CERVICAL DECOMP/DISCECTOMY FUSION N/A 08/22/2022   Procedure: C4-6 ANTERIOR CERVICAL DISCECTOMY AND FUSION (NUVASIVE ACP, GLOBUS HEDRON);  Surgeon: Venetia Night, MD;  Location: [facility] ORS;  Service: Neurosurgery;  Laterality: N/A;   APPENDECTOMY  1975   CARDIAC CATHETERIZATION     COLONOSCOPY WITH PROPOFOL N/A 02/12/2019   Procedure: COLONOSCOPY WITH PROPOFOL;  Surgeon: Midge Minium, MD;  Location: [facility] ENDOSCOPY;  Service: Endoscopy;  Laterality: N/A;   CORONARY STENT INTERVENTION N/A 09/09/2017   Procedure: CORONARY STENT INTERVENTION;  Surgeon: Iran Ouch, MD;  Location: [facility] INVASIVE CV LAB;  Service: Cardiovascular;  Laterality: N/A;   CORONARY ULTRASOUND/IVUS N/A 09/09/2017   Procedure: Intravascular Ultrasound/IVUS;  Surgeon: Iran Ouch, MD;  Location: [facility] INVASIVE CV LAB;  Service: Cardiovascular;  Laterality: N/A;   CORONARY/GRAFT ACUTE MI REVASCULARIZATION N/A 07/18/2017   Procedure:  Coronary/Graft Acute MI Revascularization;  Surgeon: Iran Ouch, MD;  Location: [facility] INVASIVE CV LAB;  Service: Cardiovascular;  Laterality: N/A;   HEMORRHOID SURGERY N/A 12/21/2014   Procedure: HEMORRHOIDECTOMY;  Surgeon: Kieth Brightly, MD;  Location: [facility] ORS;  Service: General;  Laterality: N/A;   KNEE SURGERY Right age 74   LEFT HEART CATH AND CORONARY ANGIOGRAPHY N/A 07/18/2017   Procedure: LEFT HEART CATH AND CORONARY ANGIOGRAPHY;  Surgeon: Iran Ouch, MD;  Location: [facility] INVASIVE CV LAB;  Service: Cardiovascular;  Laterality: N/A;   LEFT HEART CATH AND CORONARY ANGIOGRAPHY Left 08/19/2017   Procedure: LEFT HEART CATH AND CORONARY ANGIOGRAPHY;  Surgeon: Iran Ouch, MD;  Location: [facility] INVASIVE CV LAB;  Service: Cardiovascular;  Laterality: Left;   LEFT HEART CATH AND CORONARY ANGIOGRAPHY N/A 11/07/2017   Procedure: LEFT HEART CATH AND CORONARY ANGIOGRAPHY;  Surgeon: Iran Ouch, MD;  Location: [facility] INVASIVE CV LAB;  Service: Cardiovascular;  Laterality: N/A;   LEFT HEART CATH AND CORONARY ANGIOGRAPHY N/A 10/17/2021   Procedure: LEFT HEART CATH AND CORONARY ANGIOGRAPHY;  Surgeon: Iran Ouch, MD;  Location: [facility] INVASIVE CV LAB;  Service: Cardiovascular;  Laterality: N/A;   ROTATOR CUFF REPAIR Right 2013   Family History  Problem Relation Age of Onset   Hypertension Mother    Alzheimer's disease Mother    Hypertension Father    Alzheimer's disease Father    Social History   Socioeconomic History   Marital status: Married    Spouse name: Raj Janus   Number of children: 2   Years of education: Not on file   Highest education level: 11th grade  Occupational History   Occupation: Retired  Tobacco Use   Smoking status: Former    Types: Cigarettes    Quit date: 07/24/2007    Years since quitting: 15.4    Passive exposure: Past   Smokeless tobacco: Former    Types: Chew   Tobacco comments:    smoking cessation materials not required  Vaping Use   Vaping Use: Never used  Substance and Sexual Activity   Alcohol use: Yes    Alcohol/week: 5.0 - 6.0 standard drinks of alcohol    Types: 5 - 6 Cans of beer per week    Comment:  weekly   Drug use: No   Sexual activity: Not Currently  Other Topics Concern   Not on file  Social History Narrative   Lives in [place]. Retired.  Completed cardiac rehab program.   Social Determinants of Health   Financial Resource Strain: Low Risk  (12/27/2022)   Overall Financial Resource Strain (CARDIA)    Difficulty of Paying Living Expenses: Not hard at all  Food Insecurity: No Food Insecurity (12/27/2022)   Hunger Vital Sign    Worried About Running Out of Food in the Last Year: Never true    Ran Out of Food in the Last Year: Never true  Transportation Needs: No Transportation Needs (12/27/2022)   PRAPARE - Administrator, Civil Service (Medical): No    Lack of Transportation (Non-Medical): No   Physical Activity: Insufficiently Active (12/27/2022)   Exercise Vital Sign    Days of Exercise per Week: 2 days    Minutes of Exercise per Session: 20 min  Stress: No Stress Concern Present (12/27/2022)   [employer] of Occupational Health - Occupational Stress Questionnaire    Feeling of Stress : Not at all  Social Connections: Socially Integrated (12/27/2022)   Social Connection  and Isolation Panel [NHANES]    Frequency of Communication with Friends and Family: More than three times a week    Frequency of Social Gatherings with Friends and Family: More than three times a week    Attends Religious Services: More than 4 times per year    Active Member of [employer] or Organizations: Yes    Attends Engineer, structural: More than 4 times per year    Marital Status: Married    Tobacco Counseling Counseling given: Not Answered Tobacco comments: smoking cessation materials not required   Clinical Intake:  Pre-visit preparation completed: Yes  Pain : No/denies pain     Nutritional Risks: None Diabetes: No  How often do you need to have someone help you when you read instructions, pamphlets, or other written materials from your doctor or pharmacy?: 1 - Never  Diabetic?no  Interpreter Needed?: No  Information entered by :: Kennedy Bucker, LPN   Activities of Daily Living    12/27/2022    9:26 AM 08/22/2022    3:00 PM  In your present state of health, do you have any difficulty performing the following activities:  Hearing? 0 0  Vision? 0 0  Difficulty concentrating or making decisions? 0 0  Walking or climbing stairs? 0 0  Comment gets winded   Dressing or bathing? 0 0  Doing errands, shopping? 0 0  Preparing Food and eating ? N   Using the Toilet? N   In the past six months, have you accidently leaked urine? N   Do you have problems with loss of bowel control? N   Managing your Medications? N   Managing your Finances? N   Housekeeping or managing your  Housekeeping? N     Patient Care Team: Reubin Milan, MD as PCP - General (Internal Medicine) Iran Ouch, MD as PCP - Cardiology (Cardiology) Sondra Come, MD as Consulting Physician (Urology) Jesusita Oka, MD (Dermatology) Dedra Skeens, PA-C (Orthopedic Surgery) Iran Ouch, MD as Consulting Physician (Cardiology)  Indicate any recent Medical Services you may have received from other than [facility] providers in the past year (date may be approximate).     Assessment:   This is a routine wellness examination for [facility].  Hearing/Vision screen Hearing Screening - Comments:: No aids  Vision Screening - Comments:: Wears glasses- [facility]   Dietary issues and exercise activities discussed: Current Exercise Habits: Home exercise routine, Type of exercise: walking, Time (Minutes): 20, Frequency (Times/Week): 2, Weekly Exercise (Minutes/Week): 40, Intensity: Mild   Goals Addressed             This Visit's Progress    DIET - EAT MORE FRUITS AND VEGETABLES         Depression Screen    12/27/2022    9:23 AM 09/07/2022    2:59 PM 06/18/2022    2:44 PM 04/17/2022    8:50 AM 12/28/2021    9:21 AM 12/20/2021    1:41 PM 11/07/2021    8:45 AM  PHQ 2/9 Scores  PHQ - 2 Score 0 1 2 1  0 0 0  PHQ- 9 Score 0 5 7 8 4 2 2     Fall Risk    12/27/2022    9:26 AM 09/07/2022    3:00 PM 06/18/2022    2:44 PM 04/17/2022    8:50 AM 12/28/2021    9:21 AM  Fall Risk   Falls in the past year? 0 0 0 0 0  Number falls in  past yr: 0 0 0 0 0  Injury with Fall? 0 0 0 0 0  Risk for fall due to : No Fall Risks No Fall Risks No Fall Risks No Fall Risks No Fall Risks  Follow up Falls prevention discussed;Falls evaluation completed Falls evaluation completed Falls evaluation completed Falls evaluation completed Falls evaluation completed    FALL RISK PREVENTION PERTAINING TO THE HOME:  Any stairs in or around the home? No  If so, are there any without handrails? No  Home free of  loose throw rugs in walkways, pet beds, electrical cords, etc? Yes  Adequate lighting in your home to reduce risk of falls? Yes   ASSISTIVE DEVICES UTILIZED TO PREVENT FALLS:  Life alert? No  Use of a cane, walker or w/c? No  Grab bars in the bathroom? No  Shower chair or bench in shower? No  Elevated toilet seat or a handicapped toilet? No   TIMED UP AND GO:  Was the test performed? Yes .  Length of time to ambulate 10 feet: 4 sec.   Gait steady and fast without use of assistive device  Cognitive Function:        12/27/2022    9:29 AM 11/07/2021    8:43 AM 12/09/2019    1:47 PM 12/03/2018    9:44 AM  6CIT Screen  What Year? 0 points 0 points 0 points 0 points  What month? 0 points 0 points 0 points 0 points  What time? 0 points 0 points 0 points 0 points  Count back from 20 0 points 0 points 0 points 0 points  Months in reverse 4 points 0 points 0 points 0 points  Repeat phrase 0 points 6 points 2 points 4 points  Total Score 4 points 6 points 2 points 4 points    Immunizations Immunization History  Administered Date(s) Administered   Fluad Quad(high Dose 65+) 03/31/2019, 04/11/2020, 04/13/2021, 04/17/2022   Influenza,inj,Quad PF,6+ Mos 06/19/2017, 03/28/2018   PFIZER(Purple Top)SARS-COV-2 Vaccination 10/01/2019, 10/27/2019   Pneumococcal Conjugate-13 03/28/2018   Pneumococcal Polysaccharide-23 12/09/2019   Tdap 06/19/2017    TDAP status: Up to date  Flu Vaccine status: Up to date  Pneumococcal vaccine status: Up to date  Covid-19 vaccine status: Completed vaccines  Qualifies for Shingles Vaccine? Yes   Zostavax completed No   Shingrix Completed?: No.    Education has been provided regarding the importance of this vaccine. Patient has been advised to call insurance company to determine out of pocket expense if they have not yet received this vaccine. Advised may also receive vaccine at local pharmacy or Health Dept. Verbalized acceptance and  understanding.  Screening Tests Health Maintenance  Topic Date Due   Zoster Vaccines- Shingrix (1 of 2) Never done   COVID-19 Vaccine (3 - Pfizer risk series) 11/24/2019   INFLUENZA VACCINE  02/21/2023   Medicare Annual Wellness (AWV)  12/27/2023   DTaP/Tdap/Td (2 - Td or Tdap) 06/20/2027   Colonoscopy  02/11/2029   Pneumonia Vaccine 31+ Years old  Completed   Hepatitis C Screening  Completed   HPV VACCINES  Aged Out    Health Maintenance  Health Maintenance Due  Topic Date Due   Zoster Vaccines- Shingrix (1 of 2) Never done   COVID-19 Vaccine (3 - Pfizer risk series) 11/24/2019    Colorectal cancer screening: Type of screening: Colonoscopy. Completed 02/12/19. Repeat every 10 years  Lung Cancer Screening: (Low Dose CT Chest recommended if Age 41-80 years, 30 pack-year currently smoking OR  have quit w/in 15years.) does not qualify.    Additional Screening:  Hepatitis C Screening: does qualify; Completed 03/28/18  Vision Screening: Recommended annual ophthalmology exams for early detection of glaucoma and other disorders of the eye. Is the patient up to date with their annual eye exam?  Yes  Who is the provider or what is the name of the office in which the patient attends annual eye exams? [facility] If pt is not established with a provider, would they like to be referred to a provider to establish care? No .   Dental Screening: Recommended annual dental exams for proper oral hygiene  Community Resource Referral / Chronic Care Management: CRR required this visit?  No   CCM required this visit?  No      Plan:     I have personally reviewed and noted the following in the patient's chart:   Medical and social history Use of alcohol, tobacco or illicit drugs  Current medications and supplements including opioid prescriptions. Patient is not currently taking opioid prescriptions. Functional ability and status Nutritional status Physical activity Advanced  directives List of other physicians Hospitalizations, surgeries, and ER visits in previous 12 months Vitals Screenings to include cognitive, depression, and falls Referrals and appointments  In addition, I have reviewed and discussed with patient certain preventive protocols, quality metrics, and best practice recommendations. A written personalized care plan for preventive services as well as general preventive health recommendations were provided to patient.     Hal Hope, LPN   07/28/1094   Nurse Notes: none

## 2022-12-27 NOTE — Patient Instructions (Signed)
Todd Weaver , Thank you for taking time to come for your Medicare Wellness Visit. I appreciate your ongoing commitment to your health goals. Please review the following plan we discussed and let me know if I can assist you in the future.   These are the goals we discussed:  Goals      DIET - EAT MORE FRUITS AND VEGETABLES     Increase physical activity     Pt would like to increase physical activity to 150 minutes a week        This is a list of the screening recommended for you and due dates:  Health Maintenance  Topic Date Due   Zoster (Shingles) Vaccine (1 of 2) Never done   COVID-19 Vaccine (3 - Pfizer risk series) 11/24/2019   Flu Shot  02/21/2023   Medicare Annual Wellness Visit  12/27/2023   DTaP/Tdap/Td vaccine (2 - Td or Tdap) 06/20/2027   Colon Cancer Screening  02/11/2029   Pneumonia Vaccine  Completed   Hepatitis C Screening  Completed   HPV Vaccine  Aged Out    Advanced directives: no  Conditions/risks identified: none  Next appointment: Follow up in one year for your annual wellness visit. 01/02/24 @ 8:15 am in person  Preventive Care 65 Years and Older, Male  Preventive care refers to lifestyle choices and visits with your health care provider that can promote health and wellness. What does preventive care include? A yearly physical exam. This is also called an annual well check. Dental exams once or twice a year. Routine eye exams. Ask your health care provider how often you should have your eyes checked. Personal lifestyle choices, including: Daily care of your teeth and gums. Regular physical activity. Eating a healthy diet. Avoiding tobacco and drug use. Limiting alcohol use. Practicing safe sex. Taking low doses of aspirin every day. Taking vitamin and mineral supplements as recommended by your health care provider. What happens during an annual well check? The services and screenings done by your health care provider during your annual well check  will depend on your age, overall health, lifestyle risk factors, and family history of disease. Counseling  Your health care provider may ask you questions about your: Alcohol use. Tobacco use. Drug use. Emotional well-being. Home and relationship well-being. Sexual activity. Eating habits. History of falls. Memory and ability to understand (cognition). Work and work Astronomer. Screening  You may have the following tests or measurements: Height, weight, and BMI. Blood pressure. Lipid and cholesterol levels. These may be checked every 5 years, or more frequently if you are over 72 years old. Skin check. Lung cancer screening. You may have this screening every year starting at age 26 if you have a 30-pack-year history of smoking and currently smoke or have quit within the past 15 years. Fecal occult blood test (FOBT) of the stool. You may have this test every year starting at age 70. Flexible sigmoidoscopy or colonoscopy. You may have a sigmoidoscopy every 5 years or a colonoscopy every 10 years starting at age 70. Prostate cancer screening. Recommendations will vary depending on your family history and other risks. Hepatitis C blood test. Hepatitis B blood test. Sexually transmitted disease (STD) testing. Diabetes screening. This is done by checking your blood sugar (glucose) after you have not eaten for a while (fasting). You may have this done every 1-3 years. Abdominal aortic aneurysm (AAA) screening. You may need this if you are a current or former smoker. Osteoporosis. You may be screened  starting at age 70 if you are at high risk. Talk with your health care provider about your test results, treatment options, and if necessary, the need for more tests. Vaccines  Your health care provider may recommend certain vaccines, such as: Influenza vaccine. This is recommended every year. Tetanus, diphtheria, and acellular pertussis (Tdap, Td) vaccine. You may need a Td booster every 10  years. Zoster vaccine. You may need this after age 70. Pneumococcal 13-valent conjugate (PCV13) vaccine. One dose is recommended after age 70. Pneumococcal polysaccharide (PPSV23) vaccine. One dose is recommended after age 70. Talk to your health care provider about which screenings and vaccines you need and how often you need them. This information is not intended to replace advice given to you by your health care provider. Make sure you discuss any questions you have with your health care provider. Document Released: 08/05/2015 Document Revised: 03/28/2016 Document Reviewed: 05/10/2015 Elsevier Interactive Patient Education  2017 [employer].  Fall Prevention in the Home Falls can cause injuries. They can happen to people of all ages. There are many things you can do to make your home safe and to help prevent falls. What can I do on the outside of my home? Regularly fix the edges of walkways and driveways and fix any cracks. Remove anything that might make you trip as you walk through a door, such as a raised step or threshold. Trim any bushes or trees on the path to your home. Use bright outdoor lighting. Clear any walking paths of anything that might make someone trip, such as rocks or tools. Regularly check to see if handrails are loose or broken. Make sure that both sides of any steps have handrails. Any raised decks and porches should have guardrails on the edges. Have any leaves, snow, or ice cleared regularly. Use sand or salt on walking paths during winter. Clean up any spills in your garage right away. This includes oil or grease spills. What can I do in the bathroom? Use night lights. Install grab bars by the toilet and in the tub and shower. Do not use towel bars as grab bars. Use non-skid mats or decals in the tub or shower. If you need to sit down in the shower, use a plastic, non-slip stool. Keep the floor dry. Clean up any water that spills on the floor as soon as it  happens. Remove soap buildup in the tub or shower regularly. Attach bath mats securely with double-sided non-slip rug tape. Do not have throw rugs and other things on the floor that can make you trip. What can I do in the bedroom? Use night lights. Make sure that you have a light by your bed that is easy to reach. Do not use any sheets or blankets that are too big for your bed. They should not hang down onto the floor. Have a firm chair that has side arms. You can use this for support while you get dressed. Do not have throw rugs and other things on the floor that can make you trip. What can I do in the kitchen? Clean up any spills right away. Avoid walking on wet floors. Keep items that you use a lot in easy-to-reach places. If you need to reach something above you, use a strong step stool that has a grab bar. Keep electrical cords out of the way. Do not use floor polish or wax that makes floors slippery. If you must use wax, use non-skid floor wax. Do not have throw  rugs and other things on the floor that can make you trip. What can I do with my stairs? Do not leave any items on the stairs. Make sure that there are handrails on both sides of the stairs and use them. Fix handrails that are broken or loose. Make sure that handrails are as long as the stairways. Check any carpeting to make sure that it is firmly attached to the stairs. Fix any carpet that is loose or worn. Avoid having throw rugs at the top or bottom of the stairs. If you do have throw rugs, attach them to the floor with carpet tape. Make sure that you have a light switch at the top of the stairs and the bottom of the stairs. If you do not have them, ask someone to add them for you. What else can I do to help prevent falls? Wear shoes that: Do not have high heels. Have rubber bottoms. Are comfortable and fit you well. Are closed at the toe. Do not wear sandals. If you use a stepladder: Make sure that it is fully opened.  Do not climb a closed stepladder. Make sure that both sides of the stepladder are locked into place. Ask someone to hold it for you, if possible. Clearly mark and make sure that you can see: Any grab bars or handrails. First and last steps. Where the edge of each step is. Use tools that help you move around (mobility aids) if they are needed. These include: Canes. Walkers. Scooters. Crutches. Turn on the lights when you go into a dark area. Replace any light bulbs as soon as they burn out. Set up your furniture so you have a clear path. Avoid moving your furniture around. If any of your floors are uneven, fix them. If there are any pets around you, be aware of where they are. Review your medicines with your doctor. Some medicines can make you feel dizzy. This can increase your chance of falling. Ask your doctor what other things that you can do to help prevent falls. This information is not intended to replace advice given to you by your health care provider. Make sure you discuss any questions you have with your health care provider. Document Released: 05/05/2009 Document Revised: 12/15/2015 Document Reviewed: 08/13/2014 Elsevier Interactive Patient Education  2017 [employer].

## 2023-01-04 ENCOUNTER — Other Ambulatory Visit: Payer: Self-pay | Admitting: Cardiovascular Disease

## 2023-01-09 ENCOUNTER — Ambulatory Visit (INDEPENDENT_AMBULATORY_CARE_PROVIDER_SITE_OTHER): Payer: Medicare HMO | Admitting: Internal Medicine

## 2023-01-09 ENCOUNTER — Encounter: Payer: Self-pay | Admitting: Internal Medicine

## 2023-01-09 VITALS — BP 112/66 | HR 49 | Ht 67.0 in | Wt 174.0 lb

## 2023-01-09 DIAGNOSIS — K219 Gastro-esophageal reflux disease without esophagitis: Secondary | ICD-10-CM | POA: Diagnosis not present

## 2023-01-09 DIAGNOSIS — N63 Unspecified lump in unspecified breast: Secondary | ICD-10-CM

## 2023-01-09 MED ORDER — OMEPRAZOLE 40 MG PO CPDR
40.0000 mg | DELAYED_RELEASE_CAPSULE | Freq: Every day | ORAL | 0 refills | Status: AC
Start: 2023-01-09 — End: ?

## 2023-01-09 NOTE — Progress Notes (Signed)
Date:  01/09/2023   Name:  Todd Weaver   DOB:  May 03, 1951   MRN:  [id number]   Chief Complaint: Mass (Left sided- Just noticed it a week or two ago. Sore to touch. No discharge. ) and Gastroesophageal Reflux (Started a month ago. )  Gastroesophageal Reflux He complains of heartburn. He reports no chest pain or no coughing. This is a recurrent problem. The problem occurs frequently. Pertinent negatives include no fatigue.   Breast lump - noted in left breast about a week ago.  Tender but not changing.  He is on spironolactone for CHF.  Lab Results  Component Value Date   NA 137 08/15/2022   K 4.8 08/15/2022   CO2 28 08/15/2022   GLUCOSE 100 (H) 08/15/2022   BUN 16 08/15/2022   CREATININE 1.14 08/15/2022   CALCIUM 9.5 08/15/2022   EGFR 77 04/17/2022   GFRNONAA >60 08/15/2022   Lab Results  Component Value Date   CHOL 166 04/17/2022   HDL 63 04/17/2022   LDLCALC 92 04/17/2022   LDLDIRECT 116 (H) 09/24/2017   TRIG 55 04/17/2022   CHOLHDL 2.6 04/17/2022   Lab Results  Component Value Date   TSH 2.390 11/07/2021   Lab Results  Component Value Date   HGBA1C 5.7 (H) 04/17/2022   Lab Results  Component Value Date   WBC 8.2 07/12/2022   HGB 14.1 07/12/2022   HCT 41.8 07/12/2022   MCV 83.9 07/12/2022   PLT 292 07/12/2022   Lab Results  Component Value Date   ALT 36 04/17/2022   AST 33 04/17/2022   ALKPHOS 61 04/17/2022   BILITOT 1.0 04/17/2022   No results found for: "25OHVITD2", "25OHVITD3", "VD25OH"   Review of Systems  Constitutional:  Negative for chills, fatigue and fever.  Respiratory:  Negative for cough, chest tightness and shortness of breath.   Cardiovascular:  Negative for chest pain and palpitations.  Gastrointestinal:  Positive for heartburn.  Neurological:  Negative for dizziness and headaches.    Patient Active Problem List   Diagnosis Date Noted   Myelopathy (HCC) 08/22/2022   Cervical spinal stenosis 08/22/2022   Myelomalacia (HCC)  08/22/2022   Cervical myelopathy (HCC) 08/22/2022   Primary insomnia 06/18/2022   Chronic deep vein thrombosis (DVT) of brachial vein of left upper extremity (HCC) 12/28/2021   Contrast media allergy 12/12/2021   GERD (gastroesophageal reflux disease) 12/12/2021   Ischemic cardiomyopathy 12/12/2021   Neck muscle spasm 12/26/2020   Urinary hesitancy 12/26/2020   Carpal tunnel syndrome, bilateral 12/26/2020   Chronic systolic congestive heart failure (HCC) 09/29/2020   Thrombophilia (HCC) 09/29/2020   PAF (paroxysmal atrial fibrillation) (HCC) 04/11/2020   PAD (peripheral artery disease) (HCC) 03/31/2019   Rectal polyp    Impingement syndrome of left shoulder region 10/14/2018   Coronary artery disease of native artery of native heart with stable angina pectoris (HCC) 10/03/2018   Essential hypertension 06/19/2017   Mixed hyperlipidemia 06/19/2017   Overweight (BMI 25.0-29.9) 06/19/2017   BPH (benign prostatic hyperplasia) 06/19/2017   ED (erectile dysfunction) 06/19/2017   MCI (mild cognitive impairment) 06/19/2017   Lumbar spondylosis with myelopathy 11/08/2016   Hx of transient ischemic attack (TIA) 2011    Allergies  Allergen Reactions   Codeine Hives and Other (See Comments)   Shellfish Allergy Hives and Swelling   Contrast Media [Iodinated Contrast Media] Itching and Other (See Comments)    Whelts on tongue   Fish Allergy Itching    Past Surgical History:  Procedure Laterality Date   ANAL FISTULECTOMY N/A 12/21/2014   Procedure: FISTULECTOMY ANAL;  Surgeon: Kieth Brightly, MD;  Location: [facility] ORS;  Service: General;  Laterality: N/A;   ANTERIOR CERVICAL DECOMP/DISCECTOMY FUSION N/A 08/22/2022   Procedure: C4-6 ANTERIOR CERVICAL DISCECTOMY AND FUSION (NUVASIVE ACP, GLOBUS HEDRON);  Surgeon: Venetia Night, MD;  Location: [facility] ORS;  Service: Neurosurgery;  Laterality: N/A;   APPENDECTOMY  1975   CARDIAC CATHETERIZATION     COLONOSCOPY WITH PROPOFOL N/A 02/12/2019    Procedure: COLONOSCOPY WITH PROPOFOL;  Surgeon: Midge Minium, MD;  Location: [facility] ENDOSCOPY;  Service: Endoscopy;  Laterality: N/A;   CORONARY STENT INTERVENTION N/A 09/09/2017   Procedure: CORONARY STENT INTERVENTION;  Surgeon: Iran Ouch, MD;  Location: [facility] INVASIVE CV LAB;  Service: Cardiovascular;  Laterality: N/A;   CORONARY ULTRASOUND/IVUS N/A 09/09/2017   Procedure: Intravascular Ultrasound/IVUS;  Surgeon: Iran Ouch, MD;  Location: [facility] INVASIVE CV LAB;  Service: Cardiovascular;  Laterality: N/A;   CORONARY/GRAFT ACUTE MI REVASCULARIZATION N/A 07/18/2017   Procedure: Coronary/Graft Acute MI Revascularization;  Surgeon: Iran Ouch, MD;  Location: [facility] INVASIVE CV LAB;  Service: Cardiovascular;  Laterality: N/A;   HEMORRHOID SURGERY N/A 12/21/2014   Procedure: HEMORRHOIDECTOMY;  Surgeon: Kieth Brightly, MD;  Location: [facility] ORS;  Service: General;  Laterality: N/A;   KNEE SURGERY Right age 6   LEFT HEART CATH AND CORONARY ANGIOGRAPHY N/A 07/18/2017   Procedure: LEFT HEART CATH AND CORONARY ANGIOGRAPHY;  Surgeon: Iran Ouch, MD;  Location: [facility] INVASIVE CV LAB;  Service: Cardiovascular;  Laterality: N/A;   LEFT HEART CATH AND CORONARY ANGIOGRAPHY Left 08/19/2017   Procedure: LEFT HEART CATH AND CORONARY ANGIOGRAPHY;  Surgeon: Iran Ouch, MD;  Location: [facility] CV LAB;  Service: Cardiovascular;  Laterality: Left;   LEFT HEART CATH AND CORONARY ANGIOGRAPHY N/A 11/07/2017   Procedure: LEFT HEART CATH AND CORONARY ANGIOGRAPHY;  Surgeon: Iran Ouch, MD;  Location: [facility] LAB;  Service: Cardiovascular;  Laterality: N/A;   LEFT HEART CATH AND CORONARY ANGIOGRAPHY N/A 10/17/2021   Procedure: LEFT HEART CATH AND CORONARY ANGIOGRAPHY;  Surgeon: Iran Ouch, MD;  Location: [facility] INVASIVE CV LAB;  Service: Cardiovascular;  Laterality: N/A;   ROTATOR CUFF REPAIR Right 2013    Social History   Tobacco Use   Smoking status: Former     Types: Cigarettes    Quit date: 07/24/2007    Years since quitting: 15.4    Passive exposure: Past   Smokeless tobacco: Former    Types: Chew   Tobacco comments:    smoking cessation materials not required  Vaping Use   Vaping Use: Never used  Substance Use Topics   Alcohol use: Yes    Alcohol/week: 5.0 - 6.0 standard drinks of alcohol    Types: 5 - 6 Cans of beer per week    Comment:  weekly   Drug use: No     Medication list has been reviewed and updated.  Current Meds  Medication Sig   amLODipine (NORVASC) 5 MG tablet Take 0.5 tablets (2.5 mg total) by mouth every evening.   aspirin EC 81 MG tablet Take 81 mg by mouth daily.    carvedilol (COREG) 3.125 MG tablet TAKE 1 TABLET TWICE DAILY WITH MEALS   clopidogrel (PLAVIX) 75 MG tablet Take 75 mg by mouth daily.   clotrimazole-betamethasone (LOTRISONE) cream Apply 1 Application topically 2 (two) times daily. (Patient taking differently: Apply 1 Application topically as needed.)   ezetimibe (  ZETIA) 10 MG tablet Take 1 tablet (10 mg total) by mouth daily.   gabapentin (NEURONTIN) 300 MG capsule Take 300 mg by mouth 2 (two) times daily.   nitroGLYCERIN (NITROSTAT) 0.4 MG SL tablet Place 1 tablet (0.4 mg total) under the tongue every 5 (five) minutes as needed for chest pain.   omeprazole (PRILOSEC) 40 MG capsule Take 1 capsule (40 mg total) by mouth daily.   oxybutynin (DITROPAN-XL) 10 MG 24 hr tablet Take 1 tablet (10 mg total) by mouth at bedtime.   rosuvastatin (CRESTOR) 40 MG tablet Take 1 tablet (40 mg total) by mouth daily. (Patient taking differently: Take 40 mg by mouth at bedtime.)   sacubitril-valsartan (ENTRESTO) 49-51 MG Take 1 tablet by mouth 2 (two) times daily.   spironolactone (ALDACTONE) 25 MG tablet Take 1 tablet (25 mg total) by mouth daily.   tamsulosin (FLOMAX) 0.4 MG CAPS capsule Take 1 capsule (0.4 mg total) by mouth daily.       01/09/2023   10:38 AM 09/07/2022    2:59 PM 06/18/2022    2:44 PM 04/17/2022     8:50 AM  GAD 7 : Generalized Anxiety Score  Nervous, Anxious, on Edge 0 0 1 1  Control/stop worrying 0 0 0 1  Worry too much - different things 0 0 0 1  Trouble relaxing 0 1 1 2   Restless 0 1 1 2   Easily annoyed or irritable 0 1 1 1   Afraid - awful might happen 0 0 0 0  Total GAD 7 Score 0 3 4 8   Anxiety Difficulty Not difficult at all Not difficult at all Not difficult at all Not difficult at all       01/09/2023   10:38 AM 12/27/2022    9:23 AM 09/07/2022    2:59 PM  Depression screen PHQ 2/9  Decreased Interest 0 0 0  Down, Depressed, Hopeless 0 0 1  PHQ - 2 Score 0 0 1  Altered sleeping 0 0 2  Tired, decreased energy 0 0 1  Change in appetite 0 0 1  Feeling bad or failure about yourself  0 0 0  Trouble concentrating 0 0 0  Moving slowly or fidgety/restless 0 0 0  Suicidal thoughts 0 0 0  PHQ-9 Score 0 0 5  Difficult doing work/chores Not difficult at all Not difficult at all Not difficult at all    BP Readings from Last 3 Encounters:  01/09/23 112/66  12/27/22 100/60  12/18/22 117/69    Physical Exam Vitals and nursing note reviewed.  Constitutional:      General: He is not in acute distress.    Appearance: He is well-developed.  HENT:     Head: Normocephalic and atraumatic.  Cardiovascular:     Rate and Rhythm: Normal rate and regular rhythm.     Heart sounds: No murmur heard. Pulmonary:     Effort: Pulmonary effort is normal. No respiratory distress.     Breath sounds: No wheezing or rhonchi.  Chest:  Breasts:    Right: No swelling, mass, nipple discharge, skin change or tenderness.     Left: Swelling and tenderness present. No mass, nipple discharge or skin change.  Abdominal:     General: Abdomen is flat. Bowel sounds are normal.     Palpations: Abdomen is soft.     Tenderness: There is no abdominal tenderness. There is no guarding or rebound.  Musculoskeletal:     Cervical back: Normal range of motion.  Skin:  General: Skin is warm and dry.      Findings: No rash.  Neurological:     Mental Status: He is alert and oriented to person, place, and time.  Psychiatric:        Mood and Affect: Mood normal.        Behavior: Behavior normal.     Wt Readings from Last 3 Encounters:  01/09/23 174 lb (78.9 kg)  12/27/22 174 lb 9.6 oz (79.2 kg)  12/18/22 175 lb 6.4 oz (79.6 kg)    BP 112/66   Pulse (!) 49   Ht 5\' 7"  (1.702 m)   Wt 174 lb (78.9 kg)   SpO2 98%   BMI 27.25 kg/m   Assessment and Plan:  Problem List Items Addressed This Visit     GERD (gastroesophageal reflux disease)    Having recurrent gerd symptoms, esp at night Only taking Gaviscon as needed Will add Omeprazole x 30 days Anti-reflux measures given      Relevant Medications   omeprazole (PRILOSEC) 40 MG capsule   Other Visit Diagnoses     Breast mass in male    -  Primary   may be due to spironolactone but would expect bilateral no discreet mass appreciated need to rule out CA then consider medication changes   Relevant Orders   MM 3D DIAGNOSTIC MAMMOGRAM BILATERAL BREAST   US LIMITED ULTRASOUND INCLUDING AXILLA LEFT BREAST    [place] LIMITED ULTRASOUND INCLUDING AXILLA RIGHT BREAST       No follow-ups on file.   Partially dictated using Dragon software, any errors are not intentional.  Reubin Milan, MD [facility] [place], [place]

## 2023-01-09 NOTE — Assessment & Plan Note (Signed)
Having recurrent gerd symptoms, esp at night Only taking Gaviscon as needed Will add Omeprazole x 30 days Anti-reflux measures given

## 2023-01-09 NOTE — Patient Instructions (Signed)
Call [facility] will call you to schedule the mammogram.  You can also call them at [phone] if you don't get a call in a day or two.

## 2023-01-25 ENCOUNTER — Other Ambulatory Visit: Payer: Medicare HMO

## 2023-01-29 ENCOUNTER — Other Ambulatory Visit: Payer: Medicare HMO

## 2023-01-31 ENCOUNTER — Ambulatory Visit
Admission: RE | Admit: 2023-01-31 | Discharge: 2023-01-31 | Disposition: A | Discharge status: Home / Self Care | Payer: Medicare HMO | Source: Ambulatory Visit | Attending: Internal Medicine | Admitting: Internal Medicine

## 2023-01-31 DIAGNOSIS — N6320 Unspecified lump in the left breast, unspecified quadrant: Secondary | ICD-10-CM | POA: Insufficient documentation

## 2023-01-31 DIAGNOSIS — N63 Unspecified lump in unspecified breast: Secondary | ICD-10-CM | POA: Insufficient documentation

## 2023-01-31 DIAGNOSIS — N62 Hypertrophy of breast: Secondary | ICD-10-CM | POA: Diagnosis not present

## 2023-01-31 DIAGNOSIS — R9230 Dense breasts, unspecified: Secondary | ICD-10-CM | POA: Diagnosis not present

## 2023-01-31 DIAGNOSIS — N6342 Unspecified lump in left breast, subareolar: Secondary | ICD-10-CM | POA: Diagnosis not present

## 2023-01-31 DIAGNOSIS — N6489 Other specified disorders of breast: Secondary | ICD-10-CM | POA: Insufficient documentation

## 2023-01-31 DIAGNOSIS — R92313 Mammographic fatty tissue density, bilateral breasts: Secondary | ICD-10-CM | POA: Diagnosis not present

## 2023-02-01 ENCOUNTER — Encounter: Payer: Self-pay | Admitting: Internal Medicine

## 2023-02-01 ENCOUNTER — Other Ambulatory Visit: Payer: Self-pay

## 2023-02-01 DIAGNOSIS — I10 Essential (primary) hypertension: Secondary | ICD-10-CM

## 2023-02-01 DIAGNOSIS — N62 Hypertrophy of breast: Secondary | ICD-10-CM | POA: Insufficient documentation

## 2023-02-06 ENCOUNTER — Other Ambulatory Visit: Payer: Self-pay | Admitting: Internal Medicine

## 2023-02-06 DIAGNOSIS — E782 Mixed hyperlipidemia: Secondary | ICD-10-CM

## 2023-02-11 ENCOUNTER — Other Ambulatory Visit: Payer: Self-pay

## 2023-02-11 DIAGNOSIS — M5412 Radiculopathy, cervical region: Secondary | ICD-10-CM

## 2023-02-11 DIAGNOSIS — G959 Disease of spinal cord, unspecified: Secondary | ICD-10-CM

## 2023-02-19 ENCOUNTER — Ambulatory Visit
Admission: RE | Admit: 2023-02-19 | Discharge: 2023-02-19 | Disposition: A | Discharge status: Home / Self Care | Payer: Medicare HMO | Attending: Neurosurgery | Admitting: Neurosurgery

## 2023-02-19 ENCOUNTER — Telehealth: Payer: Self-pay

## 2023-02-19 ENCOUNTER — Other Ambulatory Visit: Payer: Self-pay

## 2023-02-19 ENCOUNTER — Ambulatory Visit
Admission: RE | Admit: 2023-02-19 | Discharge: 2023-02-19 | Disposition: A | Discharge status: Home / Self Care | Payer: Medicare HMO | Source: Ambulatory Visit | Attending: Neurosurgery | Admitting: Neurosurgery

## 2023-02-19 ENCOUNTER — Encounter: Payer: Self-pay | Admitting: Neurosurgery

## 2023-02-19 ENCOUNTER — Ambulatory Visit: Payer: Medicare HMO | Admitting: Neurosurgery

## 2023-02-19 ENCOUNTER — Encounter: Payer: Self-pay | Admitting: Neurology

## 2023-02-19 VITALS — BP 118/70 | Ht 67.0 in | Wt 177.8 lb

## 2023-02-19 DIAGNOSIS — M5412 Radiculopathy, cervical region: Secondary | ICD-10-CM | POA: Diagnosis not present

## 2023-02-19 DIAGNOSIS — Z981 Arthrodesis status: Secondary | ICD-10-CM | POA: Diagnosis not present

## 2023-02-19 DIAGNOSIS — R202 Paresthesia of skin: Secondary | ICD-10-CM

## 2023-02-19 DIAGNOSIS — R29898 Other symptoms and signs involving the musculoskeletal system: Secondary | ICD-10-CM

## 2023-02-19 DIAGNOSIS — G959 Disease of spinal cord, unspecified: Secondary | ICD-10-CM

## 2023-02-19 MED ORDER — GABAPENTIN 300 MG PO CAPS: 300.0000 mg | ORAL_CAPSULE | Freq: Three times a day (TID) | ORAL | 0 refills | Status: DC

## 2023-02-19 NOTE — Progress Notes (Signed)
   REFERRING PHYSICIAN:  Reubin Milan, Md [address] [place],  [place] [address]  DOS: 08/22/22 C4-6 ACDF   HISTORY OF PRESENT ILLNESS: ESHAN HAYMAN is status post ACDF.  He is doing okay.  He is having severe pain in his left arm on the medial aspect of his forearm.  He has numbness in the third through fifth fingers of his left hand.  He also has some numbness in his right hand, but his left is worse.  He has noticed the symptoms while playing guitar.  He is having some neck discomfort.  This is causing him substantial difficulty.  PHYSICAL EXAMINATION:  NEUROLOGICAL:  General: In no acute distress.   Awake, alert, oriented to person, place, and time.  Pupils equal round and reactive to light.  Facial tone is symmetric.  Tongue protrusion is midline.    Strength: Side Biceps Triceps Deltoid Interossei Grip Wrist Ext. Wrist Flex.  R 5 5 5 4 4 5 5   L 5 5 5  4- 4- 5 5   Incision c/d/I  Imaging:  No complications  Assessment / Plan: LIAMJAMES LEADBEATER is doing well after C4-6 ACDF.  He appears to be having new symptoms concerning for either C8 radiculopathy versus ulnar neuropathy.  Given his weakness, I would like to expedite his workup.  I will get a cervical spine MRI scan as well as a nerve conduction study.  We will discuss the findings after these were both performed.  Venetia Night MD Dept of Neurosurgery

## 2023-02-19 NOTE — Telephone Encounter (Signed)
Dr. Myer Haff put in a referral to Neurology for the patient to have a EMG. Please send this to [place].

## 2023-02-26 ENCOUNTER — Ambulatory Visit: Payer: Medicare HMO | Admitting: Neurology

## 2023-02-26 DIAGNOSIS — R202 Paresthesia of skin: Secondary | ICD-10-CM | POA: Diagnosis not present

## 2023-02-26 DIAGNOSIS — G5603 Carpal tunnel syndrome, bilateral upper limbs: Secondary | ICD-10-CM

## 2023-02-26 NOTE — Procedures (Signed)
[facility]  [address] [place], [address]  [place], [place] [address] Tel: [phone] Fax: [phone] Test Date:  02/26/2023  Patient: Todd Weaver DOB: [birth year] Physician: Jacquelyne Balint, MD  Sex: Male Height: 5\' 7"  Ref Phys: Venetia Night, MD  ID#: [id number]   Technician:    History: This is a 72 year old male with hand numbness, pain, and weakness.  NCV & EMG Findings: Extensive electrodiagnostic evaluation of bilateral upper limbs shows: Left median sensory response is absent. Right median sensory response shows prolonged distal peak latency (5.2 ms) and reduced amplitude (7 V). Bilateral ulnar and radial sensory responses are within normal limits. Bilateral median (APB) motor responses show prolonged distal onset latency (L6.8, R5.1 ms). Chronic motor axon loss changes without active denervation changes are seen in the right triceps muscle. All other tested muscles are within normal limits with normal motor unit configuration and recruitment patterns. Cervical paraspinal muscles were deferred due to prior cervical spine surgery.  Impression: This is an abnormal study. The findings are most consistent with the following: Evidence of bilateral median mononeuropathy at or distal to the wrist, consistent with carpal tunnel syndrome. The findings are severe in degree electrically on the left and moderate in degree electrically on the left. No electrodiagnostic evidence of a right or left ulnar mononeuropathy. No definitive electrodiagnostic evidence of a right or left cervical (C5-T1) motor radiculopathy. Minimal chronic neurogenic changes isolated to the right triceps muscle are seen on needle examination, with no active or ongoing changes, that is of unclear significance. This finding is too limited in degree and distribution for diagnostic purposes.    ___________________________ Jacquelyne Balint, MD    Nerve Conduction Studies Motor Nerve Results    Latency  Amplitude F-Lat Segment Distance CV Comment  Site (ms) Norm (mV) Norm (ms)  (cm) (m/s) Norm   Left Median (APB) Motor  Wrist *6.8  < 4.0 5.2  > 5.0        Elbow 12.2 - 5.2 -  Elbow-Wrist 30 56  > 50   Right Median (APB) Motor  Wrist *5.1  < 4.0 7.4  > 5.0        Elbow 10.5 - 7.1 -  Elbow-Wrist 28.5 53  > 50   Left Ulnar (ADM) Motor  Wrist 2.2  < 3.1 10.0  > 7.0        Bel elbow 6.1 - 10.0 -  Bel elbow-Wrist 22 56  > 50   Ab elbow 7.9 - 9.6 -  Ab elbow-Bel elbow 10 56 -   Right Ulnar (ADM) Motor  Wrist 2.2  < 3.1 12.8  > 7.0        Bel elbow 6.6 - 11.6 -  Bel elbow-Wrist 23.5 53  > 50   Ab elbow 8.6 - 10.9 -  Ab elbow-Bel elbow 10 50 -    Sensory Sites    Neg Peak Lat Amplitude (O-P) Segment Distance Velocity Comment  Site (ms) Norm (V) Norm  (cm) (ms)   Left Median Sensory  Wrist-Dig II *NR  < 3.8 *NR  > 10 Wrist-Dig II 13    Right Median Sensory  Wrist-Dig II *5.2  < 3.8 *7  > 10 Wrist-Dig II 13    Left Radial Sensory  Forearm-Wrist 2.4  < 2.8 22  > 10 Forearm-Wrist 10    Right Radial Sensory  Forearm-Wrist 2.4  < 2.8 23  > 10 Forearm-Wrist 10    Left Ulnar Sensory  Wrist-Dig V  2.7  < 3.2 13  > 5 Wrist-Dig V 11    Right Ulnar Sensory  Wrist-Dig V 3.0  < 3.2 15  > 5 Wrist-Dig V 11     Electromyography   Side Muscle Ins.Act Fibs Fasc Recrt Amp Dur Poly Activation Comment  Left FDI Nml Nml Nml Nml Nml Nml Nml Nml N/A  Left EIP Nml Nml Nml Nml Nml Nml Nml Nml N/A  Left APB Nml Nml Nml Nml Nml Nml Nml Nml N/A  Left Pronator teres Nml Nml Nml Nml Nml Nml Nml Nml N/A  Left Biceps Nml Nml Nml Nml Nml Nml Nml Nml N/A  Left Triceps Nml Nml Nml Nml Nml Nml Nml Nml N/A  Left Deltoid Nml Nml Nml Nml Nml Nml Nml Nml N/A  Right FDI Nml Nml Nml Nml Nml Nml Nml Nml N/A  Right EIP Nml Nml Nml Nml Nml Nml Nml Nml N/A  Right FPL Nml Nml Nml Nml Nml Nml Nml Nml N/A  Right APB Nml Nml Nml Nml Nml Nml Nml Nml N/A  Right Pronator teres Nml Nml Nml Nml Nml Nml Nml Nml N/A  Right Biceps Nml  Nml Nml Nml Nml Nml Nml Nml N/A  Right Triceps Nml Nml Nml *2- *1+ *1+ *1+ Nml N/A  Right Deltoid Nml Nml Nml Nml Nml Nml Nml Nml N/A      Waveforms:  Motor           Sensory

## 2023-03-05 ENCOUNTER — Ambulatory Visit
Admission: RE | Admit: 2023-03-05 | Discharge: 2023-03-05 | Disposition: A | Discharge status: Home / Self Care | Payer: Medicare HMO | Source: Ambulatory Visit | Attending: Neurosurgery | Admitting: Neurosurgery

## 2023-03-05 DIAGNOSIS — R29898 Other symptoms and signs involving the musculoskeletal system: Secondary | ICD-10-CM

## 2023-03-05 DIAGNOSIS — M542 Cervicalgia: Secondary | ICD-10-CM | POA: Diagnosis not present

## 2023-03-05 DIAGNOSIS — Z981 Arthrodesis status: Secondary | ICD-10-CM | POA: Diagnosis not present

## 2023-03-05 DIAGNOSIS — M5412 Radiculopathy, cervical region: Secondary | ICD-10-CM

## 2023-03-11 ENCOUNTER — Encounter: Payer: Medicare HMO | Admitting: Neurology

## 2023-03-15 ENCOUNTER — Telehealth: Payer: Self-pay | Admitting: Neurosurgery

## 2023-03-15 NOTE — Telephone Encounter (Signed)
No answer - mailvox full

## 2023-03-18 ENCOUNTER — Telehealth: Payer: Self-pay | Admitting: Neurosurgery

## 2023-03-18 ENCOUNTER — Other Ambulatory Visit: Payer: Self-pay | Admitting: Neurosurgery

## 2023-03-18 MED ORDER — BACLOFEN 10 MG PO TABS: 10.0000 mg | ORAL_TABLET | Freq: Three times a day (TID) | ORAL | 1 refills | Status: AC | PRN

## 2023-03-18 NOTE — Progress Notes (Signed)
Meds sent

## 2023-03-18 NOTE — Telephone Encounter (Signed)
I spoke to Mr. Yack regarding his EMG results.  There is suggestion of a peripheral nerve issue that may be in addition to his neck issue.  I would like him to be evaluated by my partner Dr. Katrinka Blazing to see whether he thinks there is a peripheral nerve component to his pain.  In the meantime, I will start him on baclofen to try to help with his nerve pain and muscle spasms.

## 2023-03-19 NOTE — Telephone Encounter (Signed)
Patient states that he already has this medication. He is aware of the refill.

## 2023-03-19 NOTE — Telephone Encounter (Signed)
Neurontin refill sent to his pharmacy. Please let him know.

## 2023-03-26 NOTE — Progress Notes (Deleted)
Referring Physician:  Venetia Night, MD [address] [place],  [place] [address]  Primary Physician:  Reubin Milan, MD  History of Present Illness: 03/26/2023 [name] is here today with a chief complaint of ***  DOS: 08/22/22 C4-6 ACDF    He is having severe pain in his left arm on the medial aspect of his forearm.  He has numbness in the third through fifth fingers of his left hand.  He also has some numbness in his right hand, but his left is worse.  He has noticed the symptoms while playing guitar.   He is having some neck discomfort.  This is causing him substantial difficulty.    Duration/Date of Injury: *** Mechanism of Injury: *** Specific Nerve if known: {ANATOMY; NERVES:23155} Weakness: ***, {Improving/worsening/no change:60406} Pain ***, {IMPROVED DECREASED NO CHANGE:22066} Numbness ***, {IMPROVED DECREASED NO CHANGE:22066}  Conservative measures:  Physical therapy: {yes/no:20286} Occupational Therapy: {yes/no:20286} Hand Therapy: {yes/no:20286} Injections: {yes/no:20286} Gabapentin: {yes/no:20286}, Lyrica: {yes/no:20286}, Cymbalta: {yes/no:20286}  Past Surgery: {yes/no:20286} Is this a Second Opinion: {yes/no:20286}  Main Question for Surgeon: ***  Review of Systems:  A 10 point review of systems is negative, except for the pertinent positives and negatives detailed in the HPI.  Past Medical History: Past Medical History:  Diagnosis Date   Acute ST elevation myocardial infarction (STEMI) of inferior wall (HCC) 07/18/2017   a.) LHC/PCI 07/18/2017: 99% o-mRCA --> overlapping 3.0 x 38 mm and 3.25 x 18 mm Xience Sierra DES   Alcohol dependence, daily use (HCC)    a.) 3-4 beers daily   Angina pectoris (HCC) 11/06/2017   BPH (benign prostatic hyperplasia) 06/19/2017   Cardiac arrest with ventricular fibrillation (HCC) 07/18/2017   a.) in setting of acute inferior STEMI; required defib in field by EMS and again in the  ED   Carpal tunnel syndrome of right wrist 12/26/2020   Cervical spinal stenosis    Chronic deep vein thrombosis (DVT) of brachial vein of left upper extremity (HCC)    Chronic systolic congestive heart failure (HCC) 09/29/2019   a.) TTE 09/29/2019: EF 45-50%, glob HK, mild MR, mild PA dil, G2DD; b.) TTE 11/28/2021: EF 45-50%, glob HK, G2DD; c.) TTE 04/18/2022: EF 45-50%, glob HK, G2DD   Coronary artery disease of native artery of native heart with stable angina pectoris (HCC) 07/18/2017   a.) LHC/PCI 07/18/17: 99 o-mRCA (overlapping 3.0x98mm & 3.25x51mm Xience Sierra DES), 60 post atrio, 85 dLM-oLAD, 40 pLCx, 60 pLAD, 40 mLAD; b.) LHC 08/19/17: 85 dLM-oLAD (PTCA), 60 pLAD, 30 mLAD, 40 pLCx, 50 RI; c.) LHC/PCI 09/09/17: unchanged LHC. 3.0 x 28mm Xience Sierra DES dLM-oLAD; d.) LHC 11/07/17: 30 mLM, 40 pLCx, 70 RI, 40 dLAD - Rx mgmt; e.) LHC 10/17/21: 70 oRI, 20 mLAD, 30 pLCx - Rx mgmt   ED (erectile dysfunction) 06/19/2017   a.) on PDE5i (sildenafil)   Essential hypertension 06/19/2017   GERD (gastroesophageal reflux disease)    Hand dermatitis 07/26/2014   Hemorrhoid    Hyperlipidemia    Hypertension    Impingement syndrome of left shoulder region 10/14/2018   Ischemic cardiomyopathy    a.) TTE 07/18/2017: EF 45-50%, mod inf/infsept HK, mild LAE, LVH; b.) LHC 08/19/2017: EF 45%; c.) TTE 09/29/2019: EF 45-50%, glob HK, G2DD; d.) MPI 01/18/2021: EF 47%; e.) TTE 11/28/2021: EF 45-50%, glob HK, G2DD; f.) TTE 04/18/2022:  EF 45-50%, glob HK, G2DD   Long term current use of antithrombotics/antiplatelets    a.) on DAPT (ASA +  clopidogrel)   Lumbar spondylosis with myelopathy 11/08/2016   MCI (mild cognitive impairment) 06/19/2017   Myelomalacia of cervical cord (HCC)    Overweight (BMI 25.0-29.9) 06/19/2017   PAD (peripheral artery disease) (HCC)    a. 01/2019 ABIs and Duplex: ABI R 0.78, L 0.79; TBI R 0.66, L 1.11. RCIA >50p, RSFA 50-74. LCIA >50p, LSFA 75-99p, 30-49d. 3 vessel runoff bilat.   PAF  (paroxysmal atrial fibrillation) (HCC)    a. Brief episode during December hospitalization-->converted on IV amio-->not on OAC.   Rectal polyp    Stroke (HCC) 09/26/2005   a.) seen acutely on brain MRI 09/26/2005 --> posterior frontal, mid parietal lobe, RIGHT occipital lobe --> tiny ischemic infarctions secondary to a shower of emboli  (watershed ischemic pattern may be present)   Thrombophilia (HCC) 09/29/2020   Urinary hesitancy 12/26/2020    Past Surgical History: Past Surgical History:  Procedure Laterality Date   ANAL FISTULECTOMY N/A 12/21/2014   Procedure: FISTULECTOMY ANAL;  Surgeon: Kieth Brightly, MD;  Location: [facility] ORS;  Service: General;  Laterality: N/A;   ANTERIOR CERVICAL DECOMP/DISCECTOMY FUSION N/A 08/22/2022   Procedure: C4-6 ANTERIOR CERVICAL DISCECTOMY AND FUSION (NUVASIVE ACP, GLOBUS HEDRON);  Surgeon: Venetia Night, MD;  Location: [facility]S;  Service: Neurosurgery;  Laterality: N/A;   APPENDECTOMY  1975   CARDIAC CATHETERIZATION     COLONOSCOPY WITH PROPOFOL N/A 02/12/2019   Procedure: COLONOSCOPY WITH PROPOFOL;  Surgeon: Midge Minium, MD;  Location: [facility] ENDOSCOPY;  Service: Endoscopy;  Laterality: N/A;   CORONARY STENT INTERVENTION N/A 09/09/2017   Procedure: CORONARY STENT INTERVENTION;  Surgeon: Iran Ouch, MD;  Location: [facility] INVASIVE CV LAB;  Service: Cardiovascular;  Laterality: N/A;   CORONARY ULTRASOUND/IVUS N/A 09/09/2017   Procedure: Intravascular Ultrasound/IVUS;  Surgeon: Iran Ouch, MD;  Location: [facility] CV [facility];  Service: Cardiovascular;  Laterality: N/A;   CORONARY/GRAFT ACUTE MI REVASCULARIZATION N/A 07/18/2017   Procedure: Coronary/Graft Acute MI Revascularization;  Surgeon: Iran Ouch, MD;  Location: [facility] CV LAB;  Service: Cardiovascular;  Laterality: N/A;   HEMORRHOID SURGERY N/A 12/21/2014   Procedure: HEMORRHOIDECTOMY;  Surgeon: Kieth Brightly, MD;  Location: [facility] ORS;  Service: General;   Laterality: N/A;   KNEE SURGERY Right age 72   LEFT HEART CATH AND CORONARY ANGIOGRAPHY N/A 07/18/2017   Procedure: LEFT HEART CATH AND CORONARY ANGIOGRAPHY;  Surgeon: Iran Ouch, MD;  Location: [facility];  Service: Cardiovascular;  Laterality: N/A;   LEFT HEART CATH AND CORONARY ANGIOGRAPHY Left 08/19/2017   Procedure: LEFT HEART CATH AND CORONARY ANGIOGRAPHY;  Surgeon: Iran Ouch, MD;  Location: [facility];  Service: Cardiovascular;  Laterality: Left;   LEFT HEART CATH AND CORONARY ANGIOGRAPHY N/A 11/07/2017   Procedure: LEFT HEART CATH AND CORONARY ANGIOGRAPHY;  Surgeon: Iran Ouch, MD;  Location: [facility];  Service: Cardiovascular;  Laterality: N/A;   LEFT HEART CATH AND CORONARY ANGIOGRAPHY N/A 10/17/2021   Procedure: LEFT HEART CATH AND CORONARY ANGIOGRAPHY;  Surgeon: Iran Ouch, MD;  Location: [facility];  Service: Cardiovascular;  Laterality: N/A;   ROTATOR CUFF REPAIR Right 2013    Allergies: Allergies as of 03/27/2023 - Review Complete 03/19/2023  Allergen Reaction Noted   Codeine Hives and Other (See Comments) 04/06/2019   Shellfish allergy Hives and Swelling 11/08/2014   Contrast media [iodinated contrast media] Itching and Other (See Comments) 08/22/2017   Fish allergy Itching 02/05/2019    Medications:  Current Outpatient  Medications:    baclofen (LIORESAL) 10 MG tablet, Take 1 tablet (10 mg total) by mouth 3 (three) times daily as needed for muscle spasms., Disp: 90 tablet, Rfl: 1   amLODipine (NORVASC) 5 MG tablet, Take 0.5 tablets (2.5 mg total) by mouth every evening., Disp: , Rfl:    aspirin EC 81 MG tablet, Take 81 mg by mouth daily. , Disp: , Rfl:    carvedilol (COREG) 3.125 MG tablet, TAKE 1 TABLET TWICE DAILY WITH MEALS, Disp: 180 tablet, Rfl: 3   clopidogrel (PLAVIX) 75 MG tablet, Take 75 mg by mouth daily., Disp: , Rfl:    clotrimazole-betamethasone (LOTRISONE) cream, Apply 1 Application  topically 2 (two) times daily. (Patient taking differently: Apply 1 Application topically as needed.), Disp: 45 g, Rfl: 1   ezetimibe (ZETIA) 10 MG tablet, Take 1 tablet (10 mg total) by mouth daily., Disp: 90 tablet, Rfl: 3   gabapentin (NEURONTIN) 300 MG capsule, TAKE 1 CAPSULE(300 MG) BY MOUTH THREE TIMES DAILY, Disp: 90 capsule, Rfl: 1   nitroGLYCERIN (NITROSTAT) 0.4 MG SL tablet, Place 1 tablet (0.4 mg total) under the tongue every 5 (five) minutes as needed for chest pain., Disp: 30 tablet, Rfl: 0   omeprazole (PRILOSEC) 40 MG capsule, Take 1 capsule (40 mg total) by mouth daily., Disp: 30 capsule, Rfl: 0   oxybutynin (DITROPAN-XL) 10 MG 24 hr tablet, Take 1 tablet (10 mg total) by mouth at bedtime., Disp: 90 tablet, Rfl: 3   rosuvastatin (CRESTOR) 40 MG tablet, TAKE 1 TABLET EVERY DAY, Disp: 90 tablet, Rfl: 3   sacubitril-valsartan (ENTRESTO) 49-51 MG, Take 1 tablet by mouth 2 (two) times daily., Disp: 180 tablet, Rfl: 3   spironolactone (ALDACTONE) 25 MG tablet, Take 1 tablet (25 mg total) by mouth daily., Disp: 90 tablet, Rfl: 3   tamsulosin (FLOMAX) 0.4 MG CAPS capsule, Take 1 capsule (0.4 mg total) by mouth daily., Disp: 90 capsule, Rfl: 3  Social History: Social History   Tobacco Use   Smoking status: Former    Current packs/day: 0.00    Types: Cigarettes    Quit date: 07/24/2007    Years since quitting: 15.6    Passive exposure: Past   Smokeless tobacco: Former    Types: Chew   Tobacco comments:    smoking cessation materials not required  Vaping Use   Vaping status: Never Used  Substance Use Topics   Alcohol use: Yes    Alcohol/week: 5.0 - 6.0 standard drinks of alcohol    Types: 5 - 6 Cans of beer per week    Comment:  weekly   Drug use: No    Family Medical History: Family History  Problem Relation Age of Onset   Hypertension Mother    Alzheimer's disease Mother    Hypertension Father    Alzheimer's disease Father    Breast cancer Neg Hx     Physical  Examination: There were no vitals filed for this visit.  General: Patient is in no apparent distress. Attention to examination is appropriate.  Neck:   Supple.  Full range of motion.  Respiratory: Patient is breathing without any difficulty.   NEUROLOGICAL:     Awake, alert, oriented to person, place, and time.  Speech is clear and fluent.   Cranial Nerves: Pupils equal round and reactive to light.  Facial tone is symmetric. Shoulder shrug is symmetric. Tongue protrusion is midline.  There is no pronator drift.  Motor Exam:  ***  Reflexes are ***2+ and symmetric  at the biceps, triceps, brachioradialis, patella and achilles.   Hoffman's is absent. Clonus is Absent  Bilateral upper and lower extremity sensation is intact to light touch ***.     Gait is normal.  ***   Medical Decision Making  Imaging: ***  Electrodiagnostics: ***  I have personally reviewed the images and electrodiagnostics and agree with the above interpretation.  Assessment and Plan: Mr. Kernaghan is a pleasant 72 y.o. male with ***. The symptoms are causing a significant impact on the patient's life. I have utilized the care everywhere function in epic to review the outside records available from external health systems, and have personally reviewed relevant imaging and electrodiagnostic workup.    Thank you for involving me in the care of this patient.    Lovenia Kim MD/MSCR Neurosurgery - Peripheral Nerve Surgery

## 2023-03-27 ENCOUNTER — Ambulatory Visit: Payer: Medicare HMO | Admitting: Neurosurgery

## 2023-03-27 ENCOUNTER — Encounter: Payer: Self-pay | Admitting: Neurosurgery

## 2023-03-27 VITALS — BP 128/76 | Ht 67.0 in | Wt 179.0 lb

## 2023-03-27 DIAGNOSIS — G5603 Carpal tunnel syndrome, bilateral upper limbs: Secondary | ICD-10-CM | POA: Diagnosis not present

## 2023-03-27 NOTE — Progress Notes (Signed)
Referring Physician:  Venetia Night, MD [address] [place],  [place] [address]  Primary Physician:  Reubin Milan, MD  History of Present Illness: 03/27/2023 [name] is here today with a chief complaint of bilateral carpal tunnel syndrome.  He has had the symptoms for many years.  He is also had cervical radiculopathy which was overlying.  He said previous decompressive surgery for this.  He states that he wakes up almost nightly having to shake out his hands.  He gets numbness and tingling and burning sensation in the lateral 3 and half digits, left worse than right.  He has had a recent EMG nerve conduction study which demonstrated severe carpal tunnel on the left and moderate carpal tunnel on the right.  He states that he has tried bracing at night which did not help.  He has not yet had any injections.  Review of Systems:  A 10 point review of systems is negative, except for the pertinent positives and negatives detailed in the HPI.  Past Medical History: Past Medical History:  Diagnosis Date   Acute ST elevation myocardial infarction (STEMI) of inferior wall (HCC) 07/18/2017   a.) LHC/PCI 07/18/2017: 99% o-mRCA --> overlapping 3.0 x 38 mm and 3.25 x 18 mm Xience Sierra DES   Alcohol dependence, daily use (HCC)    a.) 3-4 beers daily   Angina pectoris (HCC) 11/06/2017   BPH (benign prostatic hyperplasia) 06/19/2017   Cardiac arrest with ventricular fibrillation (HCC) 07/18/2017   a.) in setting of acute inferior STEMI; required defib in field by EMS and again in the ED   Carpal tunnel syndrome of right wrist 12/26/2020   Cervical spinal stenosis    Chronic deep vein thrombosis (DVT) of brachial vein of left upper extremity (HCC)    Chronic systolic congestive heart failure (HCC) 09/29/2019   a.) TTE 09/29/2019: EF 45-50%, glob HK, mild MR, mild PA dil, G2DD; b.) TTE 11/28/2021: EF 45-50%, glob HK, G2DD; c.) TTE 04/18/2022: EF 45-50%, glob  HK, G2DD   Coronary artery disease of native artery of native heart with stable angina pectoris (HCC) 07/18/2017   a.) LHC/PCI 07/18/17: 99 o-mRCA (overlapping 3.0x24mm & 3.25x17mm Xience Sierra DES), 60 post atrio, 85 dLM-oLAD, 40 pLCx, 60 pLAD, 40 mLAD; b.) LHC 08/19/17: 85 dLM-oLAD (PTCA), 60 pLAD, 30 mLAD, 40 pLCx, 50 RI; c.) LHC/PCI 09/09/17: unchanged LHC. 3.0 x 28mm Xience Sierra DES dLM-oLAD; d.) LHC 11/07/17: 30 mLM, 40 pLCx, 70 RI, 40 dLAD - Rx mgmt; e.) LHC 10/17/21: 70 oRI, 20 mLAD, 30 pLCx - Rx mgmt   ED (erectile dysfunction) 06/19/2017   a.) on PDE5i (sildenafil)   Essential hypertension 06/19/2017   GERD (gastroesophageal reflux disease)    Hand dermatitis 07/26/2014   Hemorrhoid    Hyperlipidemia    Hypertension    Impingement syndrome of left shoulder region 10/14/2018   Ischemic cardiomyopathy    a.) TTE 07/18/2017: EF 45-50%, mod inf/infsept HK, mild LAE, LVH; b.) LHC 08/19/2017: EF 45%; c.) TTE 09/29/2019: EF 45-50%, glob HK, G2DD; d.) MPI 01/18/2021: EF 47%; e.) TTE 11/28/2021: EF 45-50%, glob HK, G2DD; f.) TTE 04/18/2022:  EF 45-50%, glob HK, G2DD   Long term current use of antithrombotics/antiplatelets    a.) on DAPT (ASA + clopidogrel)   Lumbar spondylosis with myelopathy 11/08/2016   MCI (mild cognitive impairment) 06/19/2017   Myelomalacia of cervical cord (HCC)    Overweight (BMI 25.0-29.9) 06/19/2017   PAD (peripheral artery disease) (  HCC)    a. 01/2019 ABIs and Duplex: ABI R 0.78, L 0.79; TBI R 0.66, L 1.11. RCIA >50p, RSFA 50-74. LCIA >50p, LSFA 75-99p, 30-49d. 3 vessel runoff bilat.   PAF (paroxysmal atrial fibrillation) (HCC)    a. Brief episode during December hospitalization-->converted on IV amio-->not on OAC.   Rectal polyp    Stroke (HCC) 09/26/2005   a.) seen acutely on brain MRI 09/26/2005 --> posterior frontal, mid parietal lobe, RIGHT occipital lobe --> tiny ischemic infarctions secondary to a shower of emboli  (watershed ischemic pattern may be present)    Thrombophilia (HCC) 09/29/2020   Urinary hesitancy 12/26/2020    Past Surgical History: Past Surgical History:  Procedure Laterality Date   ANAL FISTULECTOMY N/A 12/21/2014   Procedure: FISTULECTOMY ANAL;  Surgeon: Kieth Brightly, MD;  Location: [facility] ORS;  Service: General;  Laterality: N/A;   ANTERIOR CERVICAL DECOMP/DISCECTOMY FUSION N/A 08/22/2022   Procedure: C4-6 ANTERIOR CERVICAL DISCECTOMY AND FUSION (NUVASIVE ACP, GLOBUS HEDRON);  Surgeon: Venetia Night, MD;  Location: [facility] ORS;  Service: Neurosurgery;  Laterality: N/A;   APPENDECTOMY  1975   CARDIAC CATHETERIZATION     COLONOSCOPY WITH PROPOFOL N/A 02/12/2019   Procedure: COLONOSCOPY WITH PROPOFOL;  Surgeon: Midge Minium, MD;  Location: [facility] ENDOSCOPY;  Service: Endoscopy;  Laterality: N/A;   CORONARY STENT INTERVENTION N/A 09/09/2017   Procedure: CORONARY STENT INTERVENTION;  Surgeon: Iran Ouch, MD;  Location: [facility] INVASIVE CV LAB;  Service: Cardiovascular;  Laterality: N/A;   CORONARY ULTRASOUND/IVUS N/A 09/09/2017   Procedure: Intravascular Ultrasound/IVUS;  Surgeon: Iran Ouch, MD;  Location: [facility] INVASIVE CV LAB;  Service: Cardiovascular;  Laterality: N/A;   CORONARY/GRAFT ACUTE MI REVASCULARIZATION N/A 07/18/2017   Procedure: Coronary/Graft Acute MI Revascularization;  Surgeon: Iran Ouch, MD;  Location: [facility] INVASIVE CV LAB;  Service: Cardiovascular;  Laterality: N/A;   HEMORRHOID SURGERY N/A 12/21/2014   Procedure: HEMORRHOIDECTOMY;  Surgeon: Kieth Brightly, MD;  Location: [facility] ORS;  Service: General;  Laterality: N/A;   KNEE SURGERY Right age 69   LEFT HEART CATH AND CORONARY ANGIOGRAPHY N/A 07/18/2017   Procedure: LEFT HEART CATH AND CORONARY ANGIOGRAPHY;  Surgeon: Iran Ouch, MD;  Location: [facility] INV[facility] CV LAB;  Service: Cardiovascular;  Laterality: N/A;   LEFT HEART CATH AND CORONARY ANGIOGRAPHY Left 08/19/2017   Procedure: LEFT HEART CATH AND CORONARY ANGIOGRAPHY;  Surgeon:  Iran Ouch, MD;  Location: [facility];  Service: Cardiovascular;  Laterality: Left;   LEFT HEART CATH AND CORONARY ANGIOGRAPHY N/A 11/07/2017   Procedure: LEFT HEART CATH AND CORONARY ANGIOGRAPHY;  Surgeon: Iran Ouch, MD;  Location: [facility];  Service: Cardiovascular;  Laterality: N/A;   LEFT HEART CATH AND CORONARY ANGIOGRAPHY N/A 10/17/2021   Procedure: LEFT HEART CATH AND CORONARY ANGIOGRAPHY;  Surgeon: Iran Ouch, MD;  Location: [facility] INV[facility] CV [facility];  Service: Cardiovascular;  Laterality: N/A;   ROTATOR CUFF REPAIR Right 2013    Allergies: Allergies as of 03/27/2023 - Review Complete 03/27/2023  Allergen Reaction Noted   Codeine Hives and Other (See Comments) 04/06/2019   Shellfish allergy Hives and Swelling 11/08/2014   Contrast media [iodinated contrast media] Itching and Other (See Comments) 08/22/2017   Fish allergy Itching 02/05/2019    Medications:  Current Outpatient Medications:    amLODipine (NORVASC) 5 MG tablet, Take 0.5 tablets (2.5 mg total) by mouth every evening., Disp: , Rfl:    aspirin EC 81 MG tablet, Take 81 mg by mouth  daily. , Disp: , Rfl:    baclofen (LIORESAL) 10 MG tablet, Take 1 tablet (10 mg total) by mouth 3 (three) times daily as needed for muscle spasms., Disp: 90 tablet, Rfl: 1   carvedilol (COREG) 3.125 MG tablet, TAKE 1 TABLET TWICE DAILY WITH MEALS, Disp: 180 tablet, Rfl: 3   clopidogrel (PLAVIX) 75 MG tablet, Take 75 mg by mouth daily., Disp: , Rfl:    clotrimazole-betamethasone (LOTRISONE) cream, Apply 1 Application topically 2 (two) times daily. (Patient taking differently: Apply 1 Application topically as needed.), Disp: 45 g, Rfl: 1   ezetimibe (ZETIA) 10 MG tablet, Take 1 tablet (10 mg total) by mouth daily., Disp: 90 tablet, Rfl: 3   gabapentin (NEURONTIN) 300 MG capsule, TAKE 1 CAPSULE(300 MG) BY MOUTH THREE TIMES DAILY, Disp: 90 capsule, Rfl: 1   nitroGLYCERIN (NITROSTAT) 0.4 MG SL tablet, Place 1  tablet (0.4 mg total) under the tongue every 5 (five) minutes as needed for chest pain., Disp: 30 tablet, Rfl: 0   omeprazole (PRILOSEC) 40 MG capsule, Take 1 capsule (40 mg total) by mouth daily., Disp: 30 capsule, Rfl: 0   oxybutynin (DITROPAN-XL) 10 MG 24 hr tablet, Take 1 tablet (10 mg total) by mouth at bedtime., Disp: 90 tablet, Rfl: 3   rosuvastatin (CRESTOR) 40 MG tablet, TAKE 1 TABLET EVERY DAY, Disp: 90 tablet, Rfl: 3   sacubitril-valsartan (ENTRESTO) 49-51 MG, Take 1 tablet by mouth 2 (two) times daily., Disp: 180 tablet, Rfl: 3   tamsulosin (FLOMAX) 0.4 MG CAPS capsule, Take 1 capsule (0.4 mg total) by mouth daily., Disp: 90 capsule, Rfl: 3   spironolactone (ALDACTONE) 25 MG tablet, Take 1 tablet (25 mg total) by mouth daily., Disp: 90 tablet, Rfl: 3  Social History: Social History   Tobacco Use   Smoking status: Former    Current packs/day: 0.00    Types: Cigarettes    Quit date: 07/24/2007    Years since quitting: 15.6    Passive exposure: Past   Smokeless tobacco: Former    Types: Chew   Tobacco comments:    smoking cessation materials not required  Vaping Use   Vaping status: Never Used  Substance Use Topics   Alcohol use: Yes    Alcohol/week: 5.0 - 6.0 standard drinks of alcohol    Types: 5 - 6 Cans of beer per week    Comment:  weekly   Drug use: No    Family Medical History: Family History  Problem Relation Age of Onset   Hypertension Mother    Alzheimer's disease Mother    Hypertension Father    Alzheimer's disease Father    Breast cancer Neg Hx     Physical Examination: Vitals:   03/27/23 1116  BP: 128/76    General: Patient is in no apparent distress. Attention to examination is appropriate.  Respiratory: Patient is breathing without any difficulty.   NEUROLOGICAL:     Awake, alert, oriented to person, place, and time.  Speech is clear and fluent.   Cranial Nerves: Pupils equal round and reactive to light.  Facial tone is symmetric.  Shoulder shrug is symmetric. Tongue protrusion is midline.  There is no pronator drift.  Motor Exam:  Median motor weakness noted on the intrinsic examination, left worse than right with more left-sided wasting than right sided wasting.  Decreased median sensation on the left worse than right, splitting of the ring finger.  Phalen's and reverse Phalen's are positive, carpal compression test positive bilaterally.  Medical Decision  Making   Electrodiagnostics:  [facility]  [address] [place], [address]  [place], [place] [address] Tel: [phone] Fax: [phone] Test Date:  02/26/2023   Patient: Ethian Jovanovic DOB: 1951-01-18 Physician: Jacquelyne Balint, MD  Sex: Male Height: 5\' 7"  Ref Phys: Venetia Night, MD  ID#: [id number]     Technician:      History: This is a 72 year old male with hand numbness, pain, and weakness.   NCV & EMG Findings: Extensive electrodiagnostic evaluation of bilateral upper limbs shows: Left median sensory response is absent. Right median sensory response shows prolonged distal peak latency (5.2 ms) and reduced amplitude (7 V). Bilateral ulnar and radial sensory responses are within normal limits. Bilateral median (APB) motor responses show prolonged distal onset latency (L6.8, R5.1 ms). Chronic motor axon loss changes without active denervation changes are seen in the right triceps muscle. All other tested muscles are within normal limits with normal motor unit configuration and recruitment patterns. Cervical paraspinal muscles were deferred due to prior cervical spine surgery.   Impression: This is an abnormal study. The findings are most consistent with the following: Evidence of bilateral median mononeuropathy at or distal to the wrist, consistent with carpal tunnel syndrome. The findings are severe in degree electrically on the left and moderate in degree electrically on the left. No electrodiagnostic evidence of a right or left ulnar  mononeuropathy. No definitive electrodiagnostic evidence of a right or left cervical (C5-T1) motor radiculopathy. Minimal chronic neurogenic changes isolated to the right triceps muscle are seen on needle examination, with no active or ongoing changes, that is of unclear significance. This finding is too limited in degree and distribution for diagnostic purposes.       ___________________________ Jacquelyne Balint, MD     Nerve Conduction Studies Motor Nerve Results                 Latency Amplitude F-Lat Segment Distance CV Comment  Site (ms) Norm (mV) Norm (ms)   (cm) (m/s) Norm    Left Median (APB) Motor  Wrist *6.8  < 4.0 5.2  > 5.0              Elbow 12.2 - 5.2 -   Elbow-Wrist 30 56  > 50    Right Median (APB) Motor  Wrist *5.1  < 4.0 7.4  > 5.0              Elbow 10.5 - 7.1 -   Elbow-Wrist 28.5 53  > 50    Left Ulnar (ADM) Motor  Wrist 2.2  < 3.1 10.0  > 7.0              Bel elbow 6.1 - 10.0 -   Bel elbow-Wrist 22 56  > 50    Ab elbow 7.9 - 9.6 -   Ab elbow-Bel elbow 10 56 -    Right Ulnar (ADM) Motor  Wrist 2.2  < 3.1 12.8  > 7.0              Bel elbow 6.6 - 11.6 -   Bel elbow-Wrist 23.5 53  > 50    Ab elbow 8.6 - 10.9 -   Ab elbow-Bel elbow 10 50 -      Sensory Sites               Neg Peak Lat Amplitude (O-P) Segment Distance Velocity Comment  Site (ms) Norm (V) Norm   (cm) (ms)    Left  Median Sensory  Wrist-Dig II *NR  < 3.8 *NR  > 10 Wrist-Dig II 13      Right Median Sensory  Wrist-Dig II *5.2  < 3.8 *7  > 10 Wrist-Dig II 13      Left Radial Sensory  Forearm-Wrist 2.4  < 2.8 22  > 10 Forearm-Wrist 10      Right Radial Sensory  Forearm-Wrist 2.4  < 2.8 23  > 10 Forearm-Wrist 10      Left Ulnar Sensory  Wrist-Dig V 2.7  < 3.2 13  > 5 Wrist-Dig V 11      Right Ulnar Sensory  Wrist-Dig V 3.0  < 3.2 15  > 5 Wrist-Dig V 11        Electromyography    Side Muscle Ins.Act Fibs Fasc Recrt Amp Dur Poly Activation Comment  Left FDI Nml Nml Nml Nml Nml Nml Nml Nml N/A   Left EIP Nml Nml Nml Nml Nml Nml Nml Nml N/A  Left APB Nml Nml Nml Nml Nml Nml Nml Nml N/A  Left Pronator teres Nml Nml Nml Nml Nml Nml Nml Nml N/A  Left Biceps Nml Nml Nml Nml Nml Nml Nml Nml N/A  Left Triceps Nml Nml Nml Nml Nml Nml Nml Nml N/A  Left Deltoid Nml Nml Nml Nml Nml Nml Nml Nml N/A  Right FDI Nml Nml Nml Nml Nml Nml Nml Nml N/A  Right EIP Nml Nml Nml Nml Nml Nml Nml Nml N/A  Right FPL Nml Nml Nml Nml Nml Nml Nml Nml N/A  Right APB Nml Nml Nml Nml Nml Nml Nml Nml N/A  Right Pronator teres Nml Nml Nml Nml Nml Nml Nml Nml N/A  Right Biceps Nml Nml Nml Nml Nml Nml Nml Nml N/A  Right Triceps Nml Nml Nml *2- *1+ *1+ *1+ Nml N/A  Right Deltoid Nml Nml Nml Nml Nml Nml Nml Nml N/A           I have personally reviewed the images and electrodiagnostics and agree with the above interpretation.  Assessment and Plan: Mr. Neglia is a pleasant 72 y.o. male with a history of cervical radiculopathy as well as carpal tunnel syndrome.  He has had his cervical spine managed by Dr. Myer Haff.  He comes to me after diagnosis of bilateral carpal tunnel, left worse than right.  EMG was positive for severe carpal tunnel on the left and moderate carpal tunnel on the right.  He has tried overnight bracing without any improvement.  Has not had any injections.  Feels like this continues to cause significant impact on his life.  He does have some weakness in the bilateral upper extremities.  We discussed carpal tunnel decompression, would consider left before the right, however he is quite active and utilizing his hands.  We would prefer to do this in a minimally invasive manner if possible.  Will plan on making a referral for carpal tunnel injections in the meantime.  The symptoms are causing a significant impact on the patient's life. I have utilized the care everywhere function in epic to review the outside records available from external health systems, and have personally reviewed relevant imaging and  electrodiagnostic workup.   Thank you for involving me in the care of this patient.    Lovenia Kim MD/MSCR Neurosurgery - Peripheral Nerve Surgery

## 2023-03-28 ENCOUNTER — Ambulatory Visit: Payer: Medicare HMO | Admitting: Neurosurgery

## 2023-04-08 ENCOUNTER — Telehealth: Payer: Self-pay | Admitting: Neurosurgery

## 2023-04-08 DIAGNOSIS — G5603 Carpal tunnel syndrome, bilateral upper limbs: Secondary | ICD-10-CM

## 2023-04-08 NOTE — Telephone Encounter (Signed)
Yes, Todd Weaver let me know that he did these as well. I can put the order in.

## 2023-04-08 NOTE — Telephone Encounter (Signed)
I notified Mr Lefton that we have placed a referral to Dr Cherylann Ratel for carpal tunnel injections

## 2023-04-08 NOTE — Telephone Encounter (Signed)
Patient has called stating that he would just like an update on his current procedures going on - states he was supposed to be getting injections done and everything, just wanting clarity on what is needing to be done since he is still in pain.

## 2023-04-18 ENCOUNTER — Ambulatory Visit
Payer: Medicare HMO | Attending: Student in an Organized Health Care Education/Training Program | Admitting: Student in an Organized Health Care Education/Training Program

## 2023-04-18 ENCOUNTER — Encounter: Payer: Self-pay | Admitting: Student in an Organized Health Care Education/Training Program

## 2023-04-18 VITALS — BP 134/79 | HR 51 | Temp 97.2°F | Resp 16 | Ht 67.0 in | Wt 187.0 lb

## 2023-04-18 DIAGNOSIS — G5603 Carpal tunnel syndrome, bilateral upper limbs: Secondary | ICD-10-CM | POA: Diagnosis not present

## 2023-04-18 DIAGNOSIS — G894 Chronic pain syndrome: Secondary | ICD-10-CM | POA: Diagnosis not present

## 2023-04-18 NOTE — Progress Notes (Signed)
Patient: Todd Weaver  Service Category: E/M  Provider: Edward Jolly, MD  DOB: [birth year]  DOS: 04/18/2023  Referring Provider: Lovenia Kim, MD  MRN: [id number]  Setting: Ambulatory outpatient  PCP: Reubin Milan, MD  Type: New Patient  Specialty: Interventional Pain Management    Location: Office  Delivery: Face-to-face     Primary Reason(s) for Visit: Encounter for initial evaluation of one or more chronic problems (new to examiner) potentially causing chronic pain, and posing a threat to normal musculoskeletal function. (Level of risk: High) CC: Hand Burn (Bilateral, all 10)  HPI  Todd Weaver is a 72 y.o. year old, male patient, who comes for the first time to our practice referred by Lovenia Kim, MD for our initial evaluation of his chronic pain. He has Lumbar spondylosis with myelopathy; Essential hypertension; Mixed hyperlipidemia; Overweight (BMI 25.0-29.9); BPH (benign prostatic hyperplasia); ED (erectile dysfunction); MCI (mild cognitive impairment); Coronary artery disease of native artery of native heart with stable angina pectoris (HCC); Impingement syndrome of left shoulder region; Rectal polyp; PAD (peripheral artery disease) (HCC); PAF (paroxysmal atrial fibrillation) (HCC); Chronic systolic congestive heart failure (HCC); Thrombophilia (HCC); Neck muscle spasm; Urinary hesitancy; Carpal tunnel syndrome, bilateral; Contrast media allergy; GERD (gastroesophageal reflux disease); Ischemic cardiomyopathy; Hx of transient ischemic attack (TIA); Chronic deep vein thrombosis (DVT) of brachial vein of left upper extremity (HCC); Primary insomnia; Myelopathy (HCC); Cervical spinal stenosis; Myelomalacia (HCC); Cervical myelopathy (HCC); and Subareolar gynecomastia in male on their problem list. Today he comes in for evaluation of his Hand Burn (Bilateral, all 10)  Pain Assessment: Location: Right, Left Arm Radiating: "pain begins in arms and extends to the fingers" Onset: More than  a month ago Duration: Chronic pain Quality: Pins and needles, Numbness Severity: 10-Worst pain ever/10 (subjective, self-reported pain score)  Effect on ADL:   Timing: Constant Modifying factors: Gabapentin BP: 134/79  HR: (!) 51  Onset and Duration: Gradual Cause of pain: Unknown Severity: Getting worse and NAS-11 on the average: 10/10 Timing: Night Aggravating Factors: Prolonged sitting Alleviating Factors: Medications and Resting Associated Problems: Numbness Quality of Pain: Burning, Constant, and Shooting Previous Examinations or Tests: MRI scan Previous Treatments: Strengthening exercises and Stretching exercises  Todd Weaver is being evaluated for possible interventional pain management therapies for the treatment of his chronic pain.   Todd Weaver is a very pleasant 72 year old male who presents with bilateral hand pain and associated numbness and tingling secondary to bilateral carpal tunnel syndrome.  He has had the symptoms for many years.  He states that the pain and tingling wakes him up at night.  He has to move his hands when this happens.  He has a recent EMG nerve conduction study which shows severe carpal tunnel on the left and moderate carpal tunnel on the right.  He has done bracing in the past which did not help.  He is currently on gabapentin which he states is not very effective and results and side effects however he can tolerate them.  He has a history of MI and cannot tolerate NSAIDs.  He is being referred here from Dr. Katrinka Blazing to consider bilateral carpal tunnel injections.  Meds   Current Outpatient Medications:    amLODipine (NORVASC) 5 MG tablet, Take 0.5 tablets (2.5 mg total) by mouth every evening., Disp: , Rfl:    aspirin EC 81 MG tablet, Take 81 mg by mouth daily. , Disp: , Rfl:    baclofen (LIORESAL) 10 MG tablet, Take 1 tablet (10  mg total) by mouth 3 (three) times daily as needed for muscle spasms., Disp: 90 tablet, Rfl: 1   carvedilol (COREG) 3.125 MG  tablet, TAKE 1 TABLET TWICE DAILY WITH MEALS, Disp: 180 tablet, Rfl: 3   clopidogrel (PLAVIX) 75 MG tablet, Take 75 mg by mouth daily., Disp: , Rfl:    clotrimazole-betamethasone (LOTRISONE) cream, Apply 1 Application topically 2 (two) times daily. (Patient taking differently: Apply 1 Application topically as needed.), Disp: 45 g, Rfl: 1   ezetimibe (ZETIA) 10 MG tablet, Take 1 tablet (10 mg total) by mouth daily., Disp: 90 tablet, Rfl: 3   gabapentin (NEURONTIN) 300 MG capsule, TAKE 1 CAPSULE(300 MG) BY MOUTH THREE TIMES DAILY, Disp: 90 capsule, Rfl: 1   nitroGLYCERIN (NITROSTAT) 0.4 MG SL tablet, Place 1 tablet (0.4 mg total) under the tongue every 5 (five) minutes as needed for chest pain., Disp: 30 tablet, Rfl: 0   omeprazole (PRILOSEC) 40 MG capsule, Take 1 capsule (40 mg total) by mouth daily., Disp: 30 capsule, Rfl: 0   rosuvastatin (CRESTOR) 40 MG tablet, TAKE 1 TABLET EVERY DAY, Disp: 90 tablet, Rfl: 3   sacubitril-valsartan (ENTRESTO) 49-51 MG, Take 1 tablet by mouth 2 (two) times daily., Disp: 180 tablet, Rfl: 3   tamsulosin (FLOMAX) 0.4 MG CAPS capsule, Take 1 capsule (0.4 mg total) by mouth daily., Disp: 90 capsule, Rfl: 3   oxybutynin (DITROPAN-XL) 10 MG 24 hr tablet, Take 1 tablet (10 mg total) by mouth at bedtime. (Patient not taking: Reported on 04/18/2023), Disp: 90 tablet, Rfl: 3   spironolactone (ALDACTONE) 25 MG tablet, Take 1 tablet (25 mg total) by mouth daily., Disp: 90 tablet, Rfl: 3  Imaging Review  Cervical Imaging: Cervical MR wo contrast: Results for orders placed during the hospital encounter of 03/05/23  MR CERVICAL SPINE WO CONTRAST  Narrative CLINICAL DATA:  Chronic neck and left arm pain. Numbness in both hands. History of prior cervical surgery.  EXAM: MRI CERVICAL SPINE WITHOUT CONTRAST  TECHNIQUE: Multiplanar, multisequence MR imaging of the cervical spine was performed. No intravenous contrast was administered.  COMPARISON:  MRI cervical spine  12/12/2022.  FINDINGS: Alignment: Trace retrolisthesis C3 on C4 is unchanged. Alignment is otherwise maintained.  Vertebrae: No fracture, evidence of discitis, or bone lesion. Again seen is postoperative change of C4-6 discectomy and fusion.  Cord: Normal signal throughout.  Posterior Fossa, vertebral arteries, paraspinal tissues: Negative.  Disc levels:  C2-3: Mild posterior bony ridging without stenosis, unchanged.  C3-4: Mild-to-moderate facet degenerative disease is worse on the left. There is a shallow disc bulge and some uncovertebral disease. The central canal is patent. Mild bilateral foraminal narrowing. No change.  C4-5: Status post discectomy and fusion.  No stenosis or change.  C5-6: Status post discectomy and fusion. The central canal is open. Uncovertebral spurring causes marked bilateral foraminal narrowing. The appearance is unchanged.  C6-7: There is a shallow disc bulge and some uncovertebral disease. The ventral thecal sac is narrowed but not effaced. Mild to moderate foraminal narrowing is worse on the right. No change.  C7-T1: Shallow disc bulge and uncovertebral disease. The central canal is open. Mild bilateral foraminal narrowing again seen. No change.  IMPRESSION: 1. No change in the appearance of the cervical spine since the prior MRI. 2. Status post C4-6 discectomy and fusion. The central canal is open at both levels. Uncovertebral spurring causes marked bilateral foraminal narrowing at C5-6. 3. Mild bilateral foraminal narrowing C3-4 and C7-T1. Mild to moderate foraminal narrowing at C6-7 is  worse on the right.   Electronically Signed By: Drusilla Kanner M.D. On: 03/12/2023 13:06  DG Cervical Spine 2 or 3 views  Narrative CLINICAL DATA:  Postop fusion  EXAM: CERVICAL SPINE - 2-3 VIEW  COMPARISON:  11/15/2022.  FINDINGS: There is no evidence of cervical spine fracture or prevertebral soft tissue swelling. Alignment is normal.  Patient is status post ACDF C4-C6.  IMPRESSION: Negative cervical spine radiographs status post C4-6 ACDF.   Electronically Signed By: Layla Maw M.D. On: 02/25/2023 22:24   Narrative CLINICAL DATA:  Left shoulder pain  EXAM: LEFT SHOULDER - 2+ VIEW  COMPARISON:  None.  FINDINGS: There is no evidence of fracture or dislocation. Minimal osteophyte formation the left glenohumeral joint. Joint space is well maintained. Mild acromioclavicular degenerative changes. Soft tissues are unremarkable.  IMPRESSION: Minimal degenerative changes of the left glenohumeral joint.  Mild acromioclavicular degenerative changes.   Electronically Signed By: Allegra Lai M.D. On: 04/14/2021 08:35     Complexity Note: Imaging results reviewed.                         ROS  Cardiovascular: Heart trouble, Abnormal heart rhythm, Daily Aspirin intake, High blood pressure, Heart attack ( Date: unknown ), Heart surgery, and Blood thinners:  Antiplatelet Pulmonary or Respiratory: Temporary stoppage of breathing during sleep Neurological: Stroke (Residual deficits or weakness: mild) and Abnormal skin sensations (Peripheral Neuropathy) Psychological-Psychiatric: No reported psychological or psychiatric signs or symptoms such as difficulty sleeping, anxiety, depression, delusions or hallucinations (schizophrenial), mood swings (bipolar disorders) or suicidal ideations or attempts Gastrointestinal: No reported gastrointestinal signs or symptoms such as vomiting or evacuating blood, reflux, heartburn, alternating episodes of diarrhea and constipation, inflamed or scarred liver, or pancreas or irrregular and/or infrequent bowel movements Genitourinary: No reported renal or genitourinary signs or symptoms such as difficulty voiding or producing urine, peeing blood, non-functioning kidney, kidney stones, difficulty emptying the bladder, difficulty controlling the flow of urine, or chronic kidney  disease Hematological: No reported hematological signs or symptoms such as prolonged bleeding, low or poor functioning platelets, bruising or bleeding easily, hereditary bleeding problems, low energy levels due to low hemoglobin or being anemic Endocrine: No reported endocrine signs or symptoms such as high or low blood sugar, rapid heart rate due to high thyroid levels, obesity or weight gain due to slow thyroid or thyroid disease Rheumatologic: No reported rheumatological signs and symptoms such as fatigue, joint pain, tenderness, swelling, redness, heat, stiffness, decreased range of motion, with or without associated rash Musculoskeletal: Negative for myasthenia gravis, muscular dystrophy, multiple sclerosis or malignant hyperthermia Work History: Retired  Allergies  Todd Weaver is allergic to codeine, shellfish allergy, contrast media [iodinated contrast media], and fish allergy.  Laboratory Chemistry Profile   Renal Lab Results  Component Value Date   BUN 16 08/15/2022   CREATININE 1.14 08/15/2022   BCR 10 04/17/2022   GFRAA 80 04/11/2020   GFRNONAA >60 08/15/2022   SPECGRAV 1.015 04/17/2022   PHUR 6.0 04/17/2022   PROTEINUR Negative 04/17/2022     Electrolytes Lab Results  Component Value Date   NA 137 08/15/2022   K 4.8 08/15/2022   CL 104 08/15/2022   CALCIUM 9.5 08/15/2022   MG 1.9 07/20/2017     Hepatic Lab Results  Component Value Date   AST 33 04/17/2022   ALT 36 04/17/2022   ALBUMIN 4.7 04/17/2022   ALKPHOS 61 04/17/2022     ID Lab Results  Component Value Date   HIV Non Reactive 11/06/2017   SARSCOV2NAA NEGATIVE 02/09/2019   STAPHAUREUS NEGATIVE 08/15/2022   MRSAPCR NEGATIVE 08/15/2022     Bone No results found for: "VD25OH", "VD125OH2TOT", "ZO1096EA5", "VD2125OH2", "25OHVITD1", "25OHVITD2", "25OHVITD3", "TESTOFREE", "TESTOSTERONE"   Endocrine Lab Results  Component Value Date   GLUCOSE 100 (H) 08/15/2022   GLUCOSEU Negative 04/17/2022   HGBA1C  5.7 (H) 04/17/2022   TSH 2.390 11/07/2021   FREET4 1.19 03/27/2016     Neuropathy Lab Results  Component Value Date   VITAMINB12 707 11/07/2021   HGBA1C 5.7 (H) 04/17/2022   HIV Non Reactive 11/06/2017     CNS No results found for: "COLORCSF", "APPEARCSF", "RBCCOUNTCSF", "WBCCSF", "POLYSCSF", "LYMPHSCSF", "EOSCSF", "PROTEINCSF", "GLUCCSF", "JCVIRUS", "CSFOLI", "IGGCSF", "LABACHR", "ACETBL"   Inflammation (CRP: Acute  ESR: Chronic) Lab Results  Component Value Date   ESRSEDRATE 2 11/07/2021     Rheumatology No results found for: "RF", "ANA", "LABURIC", "URICUR", "LYMEIGGIGMAB", "LYMEABIGMQN", "HLAB27"   Coagulation Lab Results  Component Value Date   INR 1.1 12/05/2020   LABPROT 14.1 12/05/2020   APTT 26 12/05/2020   PLT 292 07/12/2022   AT3 113 12/28/2021     Cardiovascular Lab Results  Component Value Date   BNP 22.6 07/12/2022   TROPONINI <0.03 11/06/2017   HGB 14.1 07/12/2022   HCT 41.8 07/12/2022     Screening Lab Results  Component Value Date   SARSCOV2NAA NEGATIVE 02/09/2019   STAPHAUREUS NEGATIVE 08/15/2022   MRSAPCR NEGATIVE 08/15/2022   HIV Non Reactive 11/06/2017     Cancer No results found for: "CEA", "CA125", "LABCA2"   Allergens No results found for: "ALMOND", "APPLE", "ASPARAGUS", "AVOCADO", "BANANA", "BARLEY", "BASIL", "BAYLEAF", "GREENBEAN", "LIMABEAN", "WHITEBEAN", "BEEFIGE", "REDBEET", "BLUEBERRY", "BROCCOLI", "CABBAGE", "MELON", "CARROT", "CASEIN", "CASHEWNUT", "CAULIFLOWER", "CELERY"     Note: Lab results reviewed.  PFSH  Drug: Todd Weaver  reports no history of drug use. Alcohol:  reports current alcohol use of about 5.0 - 6.0 standard drinks of alcohol per week. Tobacco:  reports that he quit smoking about 15 years ago. His smoking use included cigarettes. He has been exposed to tobacco smoke. He has quit using smokeless tobacco.  His smokeless tobacco use included chew. Medical:  has a past medical history of Acute ST elevation  myocardial infarction (STEMI) of inferior wall (HCC) (07/18/2017), Alcohol dependence, daily use (HCC), Angina pectoris (HCC) (11/06/2017), BPH (benign prostatic hyperplasia) (06/19/2017), Cardiac arrest with ventricular fibrillation (HCC) (07/18/2017), Carpal tunnel syndrome of right wrist (12/26/2020), Cervical spinal stenosis, Chronic deep vein thrombosis (DVT) of brachial vein of left upper extremity (HCC), Chronic systolic congestive heart failure (HCC) (09/29/2019), Coronary artery disease of native artery of native heart with stable angina pectoris (HCC) (07/18/2017), ED (erectile dysfunction) (06/19/2017), Essential hypertension (06/19/2017), GERD (gastroesophageal reflux disease), Hand dermatitis (07/26/2014), Hemorrhoid, Hyperlipidemia, Hypertension, Impingement syndrome of left shoulder region (10/14/2018), Ischemic cardiomyopathy, Long term current use of antithrombotics/antiplatelets, Lumbar spondylosis with myelopathy (11/08/2016), MCI (mild cognitive impairment) (06/19/2017), Myelomalacia of cervical cord (HCC), Overweight (BMI 25.0-29.9) (06/19/2017), PAD (peripheral artery disease) (HCC), PAF (paroxysmal atrial fibrillation) (HCC), Rectal polyp, Stroke (HCC) (09/26/2005), Thrombophilia (HCC) (09/29/2020), and Urinary hesitancy (12/26/2020). Family: family history includes Alzheimer's disease in his father and mother; Hypertension in his father and mother.  Past Surgical History:  Procedure Laterality Date   ANAL FISTULECTOMY N/A 12/21/2014   Procedure: FISTULECTOMY ANAL;  Surgeon: Kieth Brightly, MD;  Location: [facility];  Service: General;  Laterality: N/A;   ANTERIOR CERVICAL DECOMP/DISCECTOMY FUSION N/A 08/22/2022   Procedure: C4-6  ANTERIOR CERVICAL DISCECTOMY AND FUSION (NUVASIVE ACP, GLOBUS HEDRON);  Surgeon: Venetia Night, MD;  Location: [facility];  Service: Neurosurgery;  Laterality: N/A;   APPENDECTOMY  1975   CARDIAC CATHETERIZATION     COLONOSCOPY WITH PROPOFOL N/A  02/12/2019   Procedure: COLONOSCOPY WITH PROPOFOL;  Surgeon: Midge Minium, MD;  Location: [facility] END[facility];  Service: Endoscopy;  Laterality: N/A;   CORONARY STENT INTERVENTION N/A 09/09/2017   Procedure: CORONARY STENT INTERVENTION;  Surgeon: Iran Ouch, MD;  Location: [facility] INVASIVE CV [facility];  Service: Cardiovascular;  Laterality: N/A;   CORONARY ULTRASOUND/IVUS N/A 09/09/2017   Procedure: Intravascular Ultrasound/IVUS;  Surgeon: Iran Ouch, MD;  Location: [facility];  Service: Cardiovascular;  Laterality: N/A;   CORONARY/GRAFT ACUTE MI REVASCULARIZATION N/A 07/18/2017   Procedure: Coronary/Graft Acute MI Revascularization;  Surgeon: Iran Ouch, MD;  Location: [facility];  Service: Cardiovascular;  Laterality: N/A;   HEMORRHOID SURGERY N/A 12/21/2014   Procedure: HEMORRHOIDECTOMY;  Surgeon: Kieth Brightly, MD;  Location: [facility];  Service: General;  Laterality: N/A;   KNEE SURGERY Right age 39   LEFT HEART CATH AND CORONARY ANGIOGRAPHY N/A 07/18/2017   Procedure: LEFT HEART CATH AND CORONARY ANGIOGRAPHY;  Surgeon: Iran Ouch, MD;  Location: [facility];  Service: Cardiovascular;  Laterality: N/A;   LEFT HEART CATH AND CORONARY ANGIOGRAPHY Left 08/19/2017   Procedure: LEFT HEART CATH AND CORONARY ANGIOGRAPHY;  Surgeon: Iran Ouch, MD;  Location: [facility];  Service: Cardiovascular;  Laterality: Left;   LEFT HEART CATH AND CORONARY ANGIOGRAPHY N/A 11/07/2017   Procedure: LEFT HEART CATH AND CORONARY ANGIOGRAPHY;  Surgeon: Iran Ouch, MD;  Location: [facility];  Service: Cardiovascular;  Laterality: N/A;   LEFT HEART CATH AND CORONARY ANGIOGRAPHY N/A 10/17/2021   Procedure: LEFT HEART CATH AND CORONARY ANGIOGRAPHY;  Surgeon: Iran Ouch, MD;  Location: [facility];  Service: Cardiovascular;  Laterality: N/A;   ROTATOR CUFF REPAIR Right 2013   Active Ambulatory Problems    Diagnosis Date Noted    Lumbar spondylosis with myelopathy 11/08/2016   Essential hypertension 06/19/2017   Mixed hyperlipidemia 06/19/2017   Overweight (BMI 25.0-29.9) 06/19/2017   BPH (benign prostatic hyperplasia) 06/19/2017   ED (erectile dysfunction) 06/19/2017   MCI (mild cognitive impairment) 06/19/2017   Coronary artery disease of native artery of native heart with stable angina pectoris (HCC) 10/03/2018   Impingement syndrome of left shoulder region 10/14/2018   Rectal polyp    PAD (peripheral artery disease) (HCC) 03/31/2019   PAF (paroxysmal atrial fibrillation) (HCC) 04/11/2020   Chronic systolic congestive heart failure (HCC) 09/29/2020   Thrombophilia (HCC) 09/29/2020   Neck muscle spasm 12/26/2020   Urinary hesitancy 12/26/2020   Carpal tunnel syndrome, bilateral 12/26/2020   Contrast media allergy 12/12/2021   GERD (gastroesophageal reflux disease) 12/12/2021   Ischemic cardiomyopathy 12/12/2021   Hx of transient ischemic attack (TIA) 2011   Chronic deep vein thrombosis (DVT) of brachial vein of left upper extremity (HCC) 12/28/2021   Primary insomnia 06/18/2022   Myelopathy (HCC) 08/22/2022   Cervical spinal stenosis 08/22/2022   Myelomalacia (HCC) 08/22/2022   Cervical myelopathy (HCC) 08/22/2022   Subareolar gynecomastia in male 02/01/2023   Resolved Ambulatory Problems    Diagnosis Date Noted   Hand dermatitis 07/26/2014   History of CVA (cerebrovascular accident) 06/19/2017   Acute myocardial infarction of inferior wall (HCC) 07/18/2017   History of ST elevation myocardial infarction (STEMI) 07/18/2017  Chest pain 11/06/2017   Unstable angina (HCC)    Acute ST elevation myocardial infarction (STEMI) of inferior wall (HCC) 07/18/2017   Hemorrhoid 12/12/2021   Past Medical History:  Diagnosis Date   Alcohol dependence, daily use (HCC)    Angina pectoris (HCC) 11/06/2017   Cardiac arrest with ventricular fibrillation (HCC) 07/18/2017   Carpal tunnel syndrome of right wrist  12/26/2020   Hyperlipidemia    Hypertension    Long term current use of antithrombotics/antiplatelets    Myelomalacia of cervical cord (HCC)    Stroke (HCC) 09/26/2005   Constitutional Exam  General appearance: Well nourished, well developed, and well hydrated. In no apparent acute distress Vitals:   04/18/23 0810  BP: 134/79  Pulse: (!) 51  Resp: 16  Temp: (!) 97.2 F (36.2 C)  TempSrc: Temporal  SpO2: 99%  Weight: 187 lb (84.8 kg)  Height: 5\' 7"  (1.702 m)   BMI Assessment: Estimated body mass index is 29.29 kg/m as calculated from the following:   Height as of this encounter: 5\' 7"  (1.702 m).   Weight as of this encounter: 187 lb (84.8 kg).  BMI interpretation table: BMI level Category Range association with higher incidence of chronic pain  <18 kg/m2 Underweight   18.5-24.9 kg/m2 Ideal body weight   25-29.9 kg/m2 Overweight Increased incidence by 20%  30-34.9 kg/m2 Obese (Class I) Increased incidence by 68%  35-39.9 kg/m2 Severe obesity (Class II) Increased incidence by 136%  >40 kg/m2 Extreme obesity (Class III) Increased incidence by 254%   Patient's current BMI Ideal Body weight  Body mass index is 29.29 kg/m. Ideal body weight: 66.1 kg (145 lb 11.6 oz) Adjusted ideal body weight: 73.6 kg (162 lb 3.8 oz)   BMI Readings from Last 4 Encounters:  04/18/23 29.29 kg/m  03/27/23 28.04 kg/m  02/19/23 27.85 kg/m  01/09/23 27.25 kg/m   Wt Readings from Last 4 Encounters:  04/18/23 187 lb (84.8 kg)  03/27/23 179 lb (81.2 kg)  02/19/23 177 lb 12.8 oz (80.6 kg)  01/09/23 174 lb (78.9 kg)    Psych/Mental status: Alert, oriented x 3 (person, place, & time)       Eyes: PERLA Respiratory: No evidence of acute respiratory distress  Cervical Spine Area Exam  Skin & Axial Inspection: Well healed scar from previous spine surgery detected Alignment: Symmetrical Functional ROM: Unrestricted ROM      Stability: No instability detected Muscle Tone/Strength:  Functionally intact. No obvious neuro-muscular anomalies detected. Sensory (Neurological): Unimpaired Palpation: No palpable anomalies             Upper Extremity (UE) Exam    Side: Right upper extremity  Side: Left upper extremity  Skin & Extremity Inspection: Skin color, temperature, and hair growth are WNL. No peripheral edema or cyanosis. No masses, redness, swelling, asymmetry, or associated skin lesions. No contractures.  Skin & Extremity Inspection: Skin color, temperature, and hair growth are WNL. No peripheral edema or cyanosis. No masses, redness, swelling, asymmetry, or associated skin lesions. No contractures.  Functional ROM: Unrestricted ROM          Functional ROM: Unrestricted ROM          Muscle Tone/Strength: Functionally intact. No obvious neuro-muscular anomalies detected.  Muscle Tone/Strength: Functionally intact. No obvious neuro-muscular anomalies detected.  Sensory (Neurological): Unimpaired          Sensory (Neurological): Unimpaired          Palpation: No palpable anomalies  Palpation: No palpable anomalies              Provocative Test(s):  Phalen's test: (+) for CTS Tinel's test: (+) for CTS Apley's scratch test (touch opposite shoulder):  Action 1 (Across chest): deferred Action 2 (Overhead): deferred Action 3 (LB reach): deferred   Provocative Test(s):  Phalen's test: (+) for CTS Tinel's test: (+) for CTS Apley's scratch test (touch opposite shoulder):  Action 1 (Across chest): deferred Action 2 (Overhead): deferred Action 3 (LB reach): deferred     Assessment  Primary Diagnosis & Pertinent Problem List: The primary encounter diagnosis was Carpal tunnel syndrome, bilateral. A diagnosis of Chronic pain syndrome was also pertinent to this visit.  Visit Diagnosis (New problems to examiner): 1. Carpal tunnel syndrome, bilateral   2. Chronic pain syndrome    Plan of Care (Initial workup plan)  1. Carpal tunnel syndrome, bilateral -  Therapeutic injection carpal tunnel; Future  2. Chronic pain syndrome    Procedure Orders         Therapeutic injection carpal tunnel     Provider-requested follow-up: Return in about 2 weeks (around 05/02/2023) for B/L Carpal Tunnel Injection with US guidance.  Future Appointments  Date Time Provider Department Center  04/24/2023  9:40 AM Reubin Milan, MD [facility]  12/18/2023  9:00 AM Sondra Come, MD [facility] None  01/07/2024  1:15 PM PCM-ANNUAL WELLNESS VISIT [facility]    Duration of encounter: .  Total time on encounter, as per AMA guidelines included both the face-to-face and non-face-to-face time personally spent by the physician and/or other qualified health care professional(s) on the day of the encounter (includes time in activities that require the physician or other qualified health care professional and does not include time in activities normally performed by clinical staff). Physician's time may include the following activities when performed: Preparing to see the patient (e.g., pre-charting review of records, searching for previously ordered imaging, lab work, and nerve conduction tests) Review of prior analgesic pharmacotherapies. Reviewing PMP Interpreting ordered tests (e.g., lab work, imaging, nerve conduction tests) Performing post-procedure evaluations, including interpretation of diagnostic procedures Obtaining and/or reviewing separately obtained history Performing a medically appropriate examination and/or evaluation Counseling and educating the patient/family/caregiver Ordering medications, tests, or procedures Referring and communicating with other health care professionals (when not separately reported) Documenting clinical information in the electronic or other health record Independently interpreting results (not separately reported) and communicating results to the patient/ family/caregiver Care coordination (not separately  reported)  Note by: Edward Jolly, MD (TTS technology used. I apologize for any typographical errors that were not detected and corrected.) Date: 04/18/2023; Time: 9:10 AM

## 2023-04-18 NOTE — Patient Instructions (Signed)
Trigger Point Injection Trigger points are areas where you have pain. A trigger point injection is a shot given in the trigger point to help relieve pain for a few days to a few months. Common places for trigger points include the neck, shoulders, upper back, or lower back. A trigger point injection will not cure long-term (chronic) pain permanently. These injections do not always work for every person. For some people, they can help to relieve pain for a few days to a few months. Tell a health care provider about: Any allergies you have. All medicines you are taking, including vitamins, herbs, eye drops, creams, and over-the-counter medicines. Any problems you or family members have had with anesthetic medicines. Any bleeding problems you have. Any surgeries you have had. Any medical conditions you have. Whether you are pregnant or may be pregnant. What are the risks? Generally, this is a safe procedure. However, problems may occur, including: Infection. Bleeding or bruising. Allergic reaction to the injected medicine. Irritation of the skin around the injection site. What happens before the procedure? Ask your health care provider about: Changing or stopping your regular medicines. This is especially important if you are taking diabetes medicines or blood thinners. Taking medicines such as aspirin and ibuprofen. These medicines can thin your blood. Do not take these medicines unless your health care provider tells you to take them. Taking over-the-counter medicines, vitamins, herbs, and supplements. What happens during the procedure?  Your health care provider will feel for trigger points. A marker may be used to circle the area for the injection. The skin over the trigger point will be washed with a germ-killing soap. You may be given a medicine to help you relax (sedative). A thin needle is used for the injection. You may feel pain or a twitching feeling when the needle enters your  skin. A numbing solution may be injected into the trigger point. Sometimes a medicine to keep down inflammation is also injected. Your health care provider may move the needle around the area where the trigger point is located until the tightness and twitching goes away. After the injection, your health care provider may put gentle pressure over the injection site. The injection site will be covered with a bandage (dressing). The procedure may vary among health care providers and hospitals. What can I expect after treatment? After treatment, you may have soreness and stiffness for 1-2 days. Follow these instructions at home: Injection site care Remove your dressing in a few hours, or as told by your health care provider. Check your injection site every day for signs of infection. Check for: Redness, swelling, or pain. Fluid or blood. Warmth. Pus or a bad smell. Managing pain, stiffness, and swelling If directed, put ice on the affected area. To do this: Put ice in a plastic bag. Place a towel between your skin and the bag. Leave the ice on for 20 minutes, 2-3 times a day. Remove the ice if your skin turns bright red. This is very important. If you cannot feel pain, heat, or cold, you have a greater risk of damage to the area. Activity If you were given a sedative during the procedure, it can affect you for several hours. Do not drive or operate machinery until your health care provider says that it is safe. Do not take baths, swim, or use a hot tub until your health care provider approves. Return to your normal activities as told by your health care provider. Ask your health care provider what  activities are safe for you. General instructions If you were asked to stop your regular medicines, ask your health care provider when you may start taking them again. You may be asked to see an occupational or physical therapist for exercises that reduce muscle strain and stretch the area of the  trigger point. Keep all follow-up visits. This is important. Contact a health care provider if: Your pain comes back, and it is worse than before the injection. You may need more injections. You have chills or a fever. The injection site becomes more painful, red, swollen, or warm to the touch. Summary A trigger point injection is a shot given in the trigger point to help relieve pain. Common places for trigger point injections are the neck, shoulders, upper back, and lower back. These injections do not always work for every person, but for some people, the injections can help to relieve pain for a few days to a few months. Contact a health care provider if symptoms come back or if they are worse than before treatment. Also, get help if the injection site becomes more painful, red, swollen, or warm to the touch. This information is not intended to replace advice given to you by your health care provider. Make sure you discuss any questions you have with your health care provider. Document Revised: 10/18/2020 Document Reviewed: 10/18/2020 [employer] Patient Education  2024 [employer]   Procedure instructions  Do not eat or drink fluids (other than water) for 6 hours before your procedure  No water for 2 hours before your procedure  Take your blood pressure medicine with a sip of water  Arrive 30 minutes before your appointment  Carefully read the "Preparing for your procedure" detailed instructions  If you have questions call us at [phone]  _____________________________________________________________________    ______________________________________________________________________  Preparing for your procedure  Appointments: If you think you may not be able to keep your appointment, call 24-48 hours in advance to cancel. We need time to make it available to others.  During your procedure appointment there will be: No Prescription Refills. No disability issues to  discussed. No medication changes or discussions.  Instructions: Food intake: Avoid eating anything solid for at least 8 hours prior to your procedure. Clear liquid intake: You may take clear liquids such as water up to 2 hours prior to your procedure. (No carbonated drinks. No soda.) Transportation: Unless otherwise stated by your physician, bring a driver. Morning Medicines: Except for blood thinners, take all of your other morning medications with a sip of water. Make sure to take your heart and blood pressure medicines. If your blood pressure's lower number is above 100, the case will be rescheduled. Blood thinners: Make sure to stop your blood thinners as instructed.  If you take a blood thinner, but were not instructed to stop it, call our office [phone] and ask to talk to a nurse. Not stopping a blood thinner prior to certain procedures could lead to serious complications. Diabetics on insulin: Notify the staff so that you can be scheduled 1st case in the morning. If your diabetes requires high dose insulin, take only  of your normal insulin dose the morning of the procedure and notify the staff that you have done so. Preventing infections: Shower with an antibacterial soap the morning of your procedure.  Build-up your immune system: Take 1000 mg of Vitamin C with every meal (3 times a day) the day prior to your procedure. Antibiotics: Inform the nursing staff  if you are taking any antibiotics or if you have any conditions that may require antibiotics prior to procedures. (Example: recent joint implants)   Pregnancy: If you are pregnant make sure to notify the nursing staff. Not doing so may result in injury to the fetus, including death.  Sickness: If you have a cold, fever, or any active infections, call and cancel or reschedule your procedure. Receiving steroids while having an infection may result in complications. Arrival: You must be in the facility at least 30 minutes prior to  your scheduled procedure. Tardiness: Your scheduled time is also the cutoff time. If you do not arrive at least 15 minutes prior to your procedure, you will be rescheduled.  Children: Do not bring any children with you. Make arrangements to keep them home. Dress appropriately: There is always a possibility that your clothing may get soiled. Avoid long dresses. Valuables: Do not bring any jewelry or valuables.  Reasons to call and reschedule or cancel your procedure: (Following these recommendations will minimize the risk of a serious complication.) Surgeries: Avoid having procedures within 2 weeks of any surgery. (Avoid for 2 weeks before or after any surgery). Flu Shots: Avoid having procedures within 2 weeks of a flu shots or . (Avoid for 2 weeks before or after immunizations). Barium: Avoid having a procedure within 7-10 days after having had a radiological study involving the use of radiological contrast. (Myelograms, Barium swallow or enema study). Heart attacks: Avoid any elective procedures or surgeries for the initial 6 months after a "Myocardial Infarction" (Heart Attack). Blood thinners: It is imperative that you stop these medications before procedures. Let us know if you if you take any blood thinner.  Infection: Avoid procedures during or within two weeks of an infection (including chest colds or gastrointestinal problems). Symptoms associated with infections include: Localized redness, fever, chills, night sweats or profuse sweating, burning sensation when voiding, cough, congestion, stuffiness, runny nose, sore throat, diarrhea, nausea, vomiting, cold or Flu symptoms, recent or current infections. It is specially important if the infection is over the area that we intend to treat. Heart and lung problems: Symptoms that may suggest an active cardiopulmonary problem include: cough, chest pain, breathing difficulties or shortness of breath, dizziness, ankle swelling, uncontrolled high or  unusually low blood pressure, and/or palpitations. If you are experiencing any of these symptoms, cancel your procedure and contact your primary care physician for an evaluation.  Remember:  Regular Business hours are:  Monday to Thursday 8:00 AM to 4:00 PM  Provider's Schedule: Delano Metz, MD:  Procedure days: Tuesday and Thursday 7:30 AM to 4:00 PM  Edward Jolly, MD:  Procedure days: Monday and Wednesday 7:30 AM to 4:00 PM

## 2023-04-24 ENCOUNTER — Encounter: Payer: Self-pay | Admitting: Internal Medicine

## 2023-04-24 ENCOUNTER — Ambulatory Visit (INDEPENDENT_AMBULATORY_CARE_PROVIDER_SITE_OTHER): Payer: Medicare HMO | Admitting: Internal Medicine

## 2023-04-24 VITALS — BP 108/72 | HR 52 | Ht 67.0 in | Wt 180.0 lb

## 2023-04-24 DIAGNOSIS — Z125 Encounter for screening for malignant neoplasm of prostate: Secondary | ICD-10-CM

## 2023-04-24 DIAGNOSIS — G5603 Carpal tunnel syndrome, bilateral upper limbs: Secondary | ICD-10-CM | POA: Diagnosis not present

## 2023-04-24 DIAGNOSIS — Z8673 Personal history of transient ischemic attack (TIA), and cerebral infarction without residual deficits: Secondary | ICD-10-CM

## 2023-04-24 DIAGNOSIS — Z23 Encounter for immunization: Secondary | ICD-10-CM | POA: Diagnosis not present

## 2023-04-24 DIAGNOSIS — R7303 Prediabetes: Secondary | ICD-10-CM | POA: Insufficient documentation

## 2023-04-24 DIAGNOSIS — I10 Essential (primary) hypertension: Secondary | ICD-10-CM

## 2023-04-24 DIAGNOSIS — K219 Gastro-esophageal reflux disease without esophagitis: Secondary | ICD-10-CM

## 2023-04-24 DIAGNOSIS — I5022 Chronic systolic (congestive) heart failure: Secondary | ICD-10-CM | POA: Diagnosis not present

## 2023-04-24 DIAGNOSIS — E782 Mixed hyperlipidemia: Secondary | ICD-10-CM | POA: Diagnosis not present

## 2023-04-24 DIAGNOSIS — Z0000 Encounter for general adult medical examination without abnormal findings: Secondary | ICD-10-CM

## 2023-04-24 DIAGNOSIS — H6122 Impacted cerumen, left ear: Secondary | ICD-10-CM

## 2023-04-24 MED ORDER — OMEPRAZOLE 40 MG PO CPDR: 40.0000 mg | DELAYED_RELEASE_CAPSULE | Freq: Every day | ORAL | 3 refills | Status: DC

## 2023-04-24 MED ORDER — SHINGRIX 50 MCG/0.5ML IM SUSR: 0.5000 mL | Freq: Once | INTRAMUSCULAR | 1 refills | Status: AC

## 2023-04-24 MED ORDER — EZETIMIBE 10 MG PO TABS: 10.0000 mg | ORAL_TABLET | Freq: Every day | ORAL | 3 refills | Status: DC

## 2023-04-24 NOTE — Assessment & Plan Note (Signed)
Referred for steroid injection - planned for next week on left

## 2023-04-24 NOTE — Patient Instructions (Signed)
See Cardiology by February 2024 - Dr. Mariah Milling

## 2023-04-24 NOTE — Assessment & Plan Note (Signed)
On Plavix and ASA.

## 2023-04-24 NOTE — Assessment & Plan Note (Addendum)
Followed by Cardiology - on Entresto and Spironolactone Doing well with stable mild DOE. Recommend follow up at least annually

## 2023-04-24 NOTE — Progress Notes (Signed)
Date:  04/24/2023   Name:  Todd Weaver   DOB:  1951-07-23   MRN:  [id number]   Chief Complaint: Annual Exam Todd Weaver is a 72 y.o. male who presents today for his Complete Annual Exam. He feels well. He reports exercising - some. He reports he is sleeping poorly due to hand numbness/tingling.  Colonoscopy: 01/2019  Immunization History  Administered Date(s) Administered   Fluad Quad(high Dose 65+) 03/31/2019, 04/11/2020, 04/13/2021, 04/17/2022   Fluad Trivalent(High Dose 65+) 04/24/2023   Influenza,inj,Quad PF,6+ Mos 06/19/2017, 03/28/2018   PFIZER(Purple Top)SARS-COV-2 Vaccination 10/01/2019, 10/27/2019   Pneumococcal Conjugate-13 03/28/2018   Pneumococcal Polysaccharide-23 12/09/2019   Tdap 06/19/2017   Health Maintenance Due  Topic Date Due   Zoster Vaccines- Shingrix (1 of 2) Never done   COVID-19 Vaccine (3 - Pfizer risk series) 11/24/2019    Lab Results  Component Value Date   PSA1 2.0 04/17/2022   PSA1 1.8 04/13/2021   PSA1 1.5 04/11/2020    Hypertension This is a chronic problem. The problem is controlled. Pertinent negatives include no chest pain, headaches, palpitations or shortness of breath. Past treatments include angiotensin blockers, calcium channel blockers, beta blockers and diuretics. Hypertensive end-organ damage includes CAD/MI, CVA and heart failure.  Hyperlipidemia This is a chronic problem. Pertinent negatives include no chest pain, myalgias or shortness of breath. Current antihyperlipidemic treatment includes statins.  CTS - having symptoms in both hands but worse on the left.  Planning injection on the left next week.  He is ready for relief as his sleep is severely affected. CHF - has not seen Cardiology since February.  He continue to have mild DOE but no chest pain, dizziness or wheezing.  He has noticed a slightly tickle in his throat and minimal cough.  He remains very active mowing lawns, doing home repair, etc.   Lab Results   Component Value Date   NA 137 08/15/2022   K 4.8 08/15/2022   CO2 28 08/15/2022   GLUCOSE 100 (H) 08/15/2022   BUN 16 08/15/2022   CREATININE 1.14 08/15/2022   CALCIUM 9.5 08/15/2022   EGFR 77 04/17/2022   GFRNONAA >60 08/15/2022   Lab Results  Component Value Date   CHOL 166 04/17/2022   HDL 63 04/17/2022   LDLCALC 92 04/17/2022   LDLDIRECT 116 (H) 09/24/2017   TRIG 55 04/17/2022   CHOLHDL 2.6 04/17/2022   Lab Results  Component Value Date   TSH 2.390 11/07/2021   Lab Results  Component Value Date   HGBA1C 5.7 (H) 04/17/2022   Lab Results  Component Value Date   WBC 8.2 07/12/2022   HGB 14.1 07/12/2022   HCT 41.8 07/12/2022   MCV 83.9 07/12/2022   PLT 292 07/12/2022   Lab Results  Component Value Date   ALT 36 04/17/2022   AST 33 04/17/2022   ALKPHOS 61 04/17/2022   BILITOT 1.0 04/17/2022   No results found for: "25OHVITD2", "25OHVITD3", "VD25OH"   Review of Systems  Constitutional:  Negative for appetite change, chills, diaphoresis, fatigue and unexpected weight change.  HENT:  Negative for hearing loss (left ear fullness), trouble swallowing and voice change.   Eyes:  Negative for visual disturbance.  Respiratory:  Positive for cough. Negative for choking, shortness of breath and wheezing.   Cardiovascular:  Negative for chest pain, palpitations and leg swelling.  Gastrointestinal:  Negative for abdominal pain, blood in stool, constipation and diarrhea.  Genitourinary:  Negative for difficulty urinating, dysuria and frequency.  Musculoskeletal:  Negative for arthralgias, back pain and myalgias.  Skin:  Negative for color change and rash.  Neurological:  Positive for numbness (and tingling in hands). Negative for dizziness, syncope and headaches.  Hematological:  Negative for adenopathy.  Psychiatric/Behavioral:  Positive for sleep disturbance. Negative for dysphoric mood. The patient is not nervous/anxious.     Patient Active Problem List   Diagnosis  Date Noted   Prediabetes 04/24/2023   Subareolar gynecomastia in male 02/01/2023   Myelopathy (HCC) 08/22/2022   Cervical spinal stenosis 08/22/2022   Myelomalacia (HCC) 08/22/2022   Cervical myelopathy (HCC) 08/22/2022   Primary insomnia 06/18/2022   Chronic deep vein thrombosis (DVT) of brachial vein of left upper extremity (HCC) 12/28/2021   Contrast media allergy 12/12/2021   GERD (gastroesophageal reflux disease) 12/12/2021   Ischemic cardiomyopathy 12/12/2021   Urinary hesitancy 12/26/2020   Carpal tunnel syndrome on both sides 12/26/2020   Chronic systolic congestive heart failure (HCC) 09/29/2020   Thrombophilia (HCC) 09/29/2020   PAF (paroxysmal atrial fibrillation) (HCC) 04/11/2020   PAD (peripheral artery disease) (HCC) 03/31/2019   Rectal polyp    Impingement syndrome of left shoulder region 10/14/2018   Coronary artery disease of native artery of native heart with stable angina pectoris (HCC) 10/03/2018   Essential hypertension 06/19/2017   Mixed hyperlipidemia 06/19/2017   Overweight (BMI 25.0-29.9) 06/19/2017   BPH (benign prostatic hyperplasia) 06/19/2017   ED (erectile dysfunction) 06/19/2017   MCI (mild cognitive impairment) 06/19/2017   Lumbar spondylosis with myelopathy 11/08/2016   Hx of transient ischemic attack (TIA) 2011    Allergies  Allergen Reactions   Codeine Hives and Other (See Comments)   Shellfish Allergy Hives and Swelling   Contrast Media [Iodinated Contrast Media] Itching and Other (See Comments)    Whelts on tongue   Fish Allergy Itching    Past Surgical History:  Procedure Laterality Date   ANAL FISTULECTOMY N/A 12/21/2014   Procedure: FISTULECTOMY ANAL;  Surgeon: Kieth Brightly, MD;  Location: [facility] ORS;  Service: General;  Laterality: N/A;   ANTERIOR CERVICAL DECOMP/DISCECTOMY FUSION N/A 08/22/2022   Procedure: C4-6 ANTERIOR CERVICAL DISCECTOMY AND FUSION (NUVASIVE ACP, GLOBUS HEDRON);  Surgeon: Venetia Night, MD;  Location:  [facility];  Service: Neurosurgery;  Laterality: N/A;   APPENDECTOMY  1975   CARDIAC CATHETERIZATION     COLONOSCOPY WITH PROPOFOL N/A 02/12/2019   Procedure: COLONOSCOPY WITH PROPOFOL;  Surgeon: Midge Minium, MD;  Location[facility];  Service: Endoscopy;  Laterality: N/A;   CORONARY STENT INTERVENTION N/A 09/09/2017   Procedure: CORONARY STENT INTERVENTION;  Surgeon: Iran Ouch, MD;  Location: [facility] INVASIVE CV LAB;  Service: Cardiovascular;  Laterality: N/A;   CORONARY ULTRASOUND/IVUS N/A 09/09/2017   Procedure: Intravascular Ultrasound/IVUS;  Surgeon: Iran Ouch, MD;  Location: [facility] CV LAB;  Service: Cardiovascular;  Laterality: N/A;   CORONARY/GRAFT ACUTE MI REVASCULARIZATION N/A 07/18/2017   Procedure: Coronary/Graft Acute MI Revascularization;  Surgeon: Iran Ouch, MD;  Location: [facility] INVASIVE CV LAB;  Service: Cardiovascular;  Laterality: N/A;   HEMORRHOID SURGERY N/A 12/21/2014   Procedure: HEMORRHOIDECTOMY;  Surgeon: Kieth Brightly, MD;  Location: [facility]S;  Service: General;  Laterality: N/A;   KNEE SURGERY Right age 87   LEFT HEART CATH AND CORONARY ANGIOGRAPHY N/A 07/18/2017   Procedure: LEFT HEART CATH AND CORONARY ANGIOGRAPHY;  Surgeon: Iran Ouch, MD;  Location: [facility] INV[facility] CV LAB;  Service: Cardiovascular;  Laterality: N/A;   LEFT HEART CATH AND CORONARY ANGIOGRAPHY Left 08/19/2017  Procedure: LEFT HEART CATH AND CORONARY ANGIOGRAPHY;  Surgeon: Iran Ouch, MD;  Location: [facility];  Service: Cardiovascular;  Laterality: Left;   LEFT HEART CATH AND CORONARY ANGIOGRAPHY N/A 11/07/2017   Procedure: LEFT HEART CATH AND CORONARY ANGIOGRAPHY;  Surgeon: Iran Ouch, MD;  Location: [facility];  Service: Cardiovascular;  Laterality: N/A;   LEFT HEART CATH AND CORONARY ANGIOGRAPHY N/A 10/17/2021   Procedure: LEFT HEART CATH AND CORONARY ANGIOGRAPHY;  Surgeon: Iran Ouch, MD;  Location: [facility] INV[facility]IVE CV LAB;   Service: Cardiovascular;  Laterality: N/A;   ROTATOR CUFF REPAIR Right 2013    Social History   Tobacco Use   Smoking status: Former    Current packs/day: 0.00    Types: Cigarettes    Quit date: 07/24/2007    Years since quitting: 15.7    Passive exposure: Past   Smokeless tobacco: Former    Types: Chew   Tobacco comments:    smoking cessation materials not required  Vaping Use   Vaping status: Never Used  Substance Use Topics   Alcohol use: Yes    Alcohol/week: 5.0 - 6.0 standard drinks of alcohol    Types: 5 - 6 Cans of beer per week    Comment:  weekly   Drug use: No     Medication list has been reviewed and updated.  Current Meds  Medication Sig   amLODipine (NORVASC) 5 MG tablet Take 0.5 tablets (2.5 mg total) by mouth every evening.   aspirin EC 81 MG tablet Take 81 mg by mouth daily.    baclofen (LIORESAL) 10 MG tablet Take 1 tablet (10 mg total) by mouth 3 (three) times daily as needed for muscle spasms.   carvedilol (COREG) 3.125 MG tablet TAKE 1 TABLET TWICE DAILY WITH MEALS   clopidogrel (PLAVIX) 75 MG tablet Take 75 mg by mouth daily.   clotrimazole-betamethasone (LOTRISONE) cream Apply 1 Application topically 2 (two) times daily. (Patient taking differently: Apply 1 Application topically as needed.)   gabapentin (NEURONTIN) 300 MG capsule TAKE 1 CAPSULE(300 MG) BY MOUTH THREE TIMES DAILY   nitroGLYCERIN (NITROSTAT) 0.4 MG SL tablet Place 1 tablet (0.4 mg total) under the tongue every 5 (five) minutes as needed for chest pain.   rosuvastatin (CRESTOR) 40 MG tablet TAKE 1 TABLET EVERY DAY   sacubitril-valsartan (ENTRESTO) 49-51 MG Take 1 tablet by mouth 2 (two) times daily.   spironolactone (ALDACTONE) 25 MG tablet Take 1 tablet (25 mg total) by mouth daily.   tamsulosin (FLOMAX) 0.4 MG CAPS capsule Take 1 capsule (0.4 mg total) by mouth daily.   Zoster Vaccine Adjuvanted [facility]) injection Inject 0.5 mLs into the muscle once for 1 dose.   [DISCONTINUED]  ezetimibe (ZETIA) 10 MG tablet Take 1 tablet (10 mg total) by mouth daily.   [DISCONTINUED] omeprazole (PRILOSEC) 40 MG capsule Take 1 capsule (40 mg total) by mouth daily.       04/24/2023    9:43 AM 01/09/2023   10:38 AM 09/07/2022    2:59 PM 06/18/2022    2:44 PM  GAD 7 : Generalized Anxiety Score  Nervous, Anxious, on Edge 0 0 0 1  Control/stop worrying 0 0 0 0  Worry too much - different things 0 0 0 0  Trouble relaxing 1 0 1 1  Restless 1 0 1 1  Easily annoyed or irritable 0 0 1 1  Afraid - awful might happen 0 0 0 0  Total GAD 7 Score  2 0 3 4  Anxiety Difficulty Not difficult at all Not difficult at all Not difficult at all Not difficult at all       04/24/2023    9:43 AM 04/18/2023    8:16 AM 01/09/2023   10:38 AM  Depression screen PHQ 2/9  Decreased Interest 0 0 0  Down, Depressed, Hopeless 0 0 0  PHQ - 2 Score 0 0 0  Altered sleeping 0  0  Tired, decreased energy 1  0  Change in appetite 0  0  Feeling bad or failure about yourself  0  0  Trouble concentrating 1  0  Moving slowly or fidgety/restless 1  0  Suicidal thoughts 0  0  PHQ-9 Score 3  0  Difficult doing work/chores Not difficult at all  Not difficult at all    BP Readings from Last 3 Encounters:  04/24/23 108/72  04/18/23 134/79  03/27/23 128/76    Physical Exam Vitals and nursing note reviewed.  Constitutional:      Appearance: Normal appearance. He is well-developed.  HENT:     Head: Normocephalic.     Right Ear: Tympanic membrane, ear canal and external ear normal.     Left Ear: Tympanic membrane, ear canal and external ear normal. There is impacted cerumen (removed with flushing).     Nose: Nose normal.  Eyes:     Conjunctiva/sclera: Conjunctivae normal.     Pupils: Pupils are equal, round, and reactive to light.  Neck:     Thyroid: No thyromegaly.     Vascular: No carotid bruit.  Cardiovascular:     Rate and Rhythm: Normal rate and regular rhythm.     Heart sounds: Normal heart  sounds.  Pulmonary:     Effort: Pulmonary effort is normal.     Breath sounds: Normal breath sounds. No wheezing.  Chest:  Breasts:    Right: No mass.     Left: No mass.  Abdominal:     General: Bowel sounds are normal.     Palpations: Abdomen is soft.     Tenderness: There is no abdominal tenderness.  Musculoskeletal:        General: Normal range of motion.     Cervical back: Normal range of motion and neck supple.     Right lower leg: No edema.     Left lower leg: No edema.  Lymphadenopathy:     Cervical: No cervical adenopathy.  Skin:    General: Skin is warm and dry.     Capillary Refill: Capillary refill takes less than 2 seconds.  Neurological:     General: No focal deficit present.     Mental Status: He is alert and oriented to person, place, and time.     Deep Tendon Reflexes: Reflexes are normal and symmetric.  Psychiatric:        Attention and Perception: Attention normal.        Mood and Affect: Mood normal.        Thought Content: Thought content normal.     Wt Readings from Last 3 Encounters:  04/24/23 180 lb (81.6 kg)  04/18/23 187 lb (84.8 kg)  03/27/23 179 lb (81.2 kg)    BP 108/72   Pulse (!) 52   Ht 5\' 7"  (1.702 m)   Wt 180 lb (81.6 kg)   SpO2 96%   BMI 28.19 kg/m   Assessment and Plan:  Problem List Items Addressed This Visit       Unprioritized  Carpal tunnel syndrome on both sides    Referred for steroid injection - planned for next week on left      Chronic systolic congestive heart failure (HCC) (Chronic)    Followed by Cardiology - on Entresto and Spironolactone Doing well with stable mild DOE. Recommend follow up at least annually      Relevant Medications   ezetimibe (ZETIA) 10 MG tablet   Other Relevant Orders   Comprehensive metabolic panel   Essential hypertension (Chronic)    Normal exam with stable BP on Entresto, amlodipine, spironolactone and Coreg. No concerns or side effects to current medication. No change in  regimen; continue low sodium diet.       Relevant Medications   ezetimibe (ZETIA) 10 MG tablet   Other Relevant Orders   CBC with Differential/Platelet   Comprehensive metabolic panel   POCT urinalysis dipstick   GERD (gastroesophageal reflux disease)   Relevant Medications   omeprazole (PRILOSEC) 40 MG capsule   Other Relevant Orders   CBC with Differential/Platelet   Hx of transient ischemic attack (TIA)    On Plavix and ASA      Mixed hyperlipidemia (Chronic)    LDL is  Lab Results  Component Value Date   LDLCALC 92 04/17/2022   Currently being treated with Crestor with good compliance and no concerns.       Relevant Medications   ezetimibe (ZETIA) 10 MG tablet   Other Relevant Orders   Lipid panel   Prediabetes    Continue work on low carb diet choices and weight control.      Relevant Orders   Hemoglobin A1c   Other Visit Diagnoses     Annual physical exam    -  Primary   Rx to get Shingrix at the pharmacy given   Impacted cerumen of left ear       cerumen flushed with removal of large amount, hearing restored   Prostate cancer screening       Relevant Orders   PSA   Need for influenza vaccination       Relevant Orders   Flu Vaccine Trivalent High Dose (Fluad) (Completed)       No follow-ups on file.    Reubin Milan, MD [facility] [place]

## 2023-04-24 NOTE — Assessment & Plan Note (Signed)
Continue work on low carb diet choices and weight control.

## 2023-04-24 NOTE — Assessment & Plan Note (Signed)
LDL is  Lab Results  Component Value Date   LDLCALC 92 04/17/2022   Currently being treated with Crestor with good compliance and no concerns.

## 2023-04-24 NOTE — Assessment & Plan Note (Signed)
Normal exam with stable BP on Entresto, amlodipine, spironolactone and Coreg. No concerns or side effects to current medication. No change in regimen; continue low sodium diet.

## 2023-04-25 LAB — COMPREHENSIVE METABOLIC PANEL
ALT: 41 [IU]/L (ref 0–44)
AST: 37 [IU]/L (ref 0–40)
Albumin: 4.6 g/dL (ref 3.9–4.9)
Alkaline Phosphatase: 50 [IU]/L (ref 44–121)
BUN/Creatinine Ratio: 10 (ref 10–24)
BUN: 12 mg/dL (ref 8–27)
Bilirubin Total: 1.3 mg/dL — ABNORMAL HIGH (ref 0.0–1.2)
CO2: 22 mmol/L (ref 20–29)
Calcium: 9.6 mg/dL (ref 8.6–10.2)
Chloride: 103 mmol/L (ref 96–106)
Creatinine, Ser: 1.22 mg/dL (ref 0.76–1.27)
Globulin, Total: 2.6 g/dL (ref 1.5–4.5)
Glucose: 106 mg/dL — ABNORMAL HIGH (ref 70–99)
Potassium: 4.4 mmol/L (ref 3.5–5.2)
Sodium: 140 mmol/L (ref 134–144)
Total Protein: 7.2 g/dL (ref 6.0–8.5)
eGFR: 64 mL/min/{1.73_m2} (ref >59)

## 2023-04-25 LAB — CBC WITH DIFFERENTIAL/PLATELET
Basophils Absolute: 0.1 10*3/uL (ref 0.0–0.2)
Basos: 1 %
EOS (ABSOLUTE): 0.6 10*3/uL — ABNORMAL HIGH (ref 0.0–0.4)
Eos: 10 %
Hematocrit: 44.2 % (ref 37.5–51.0)
Hemoglobin: 14.1 g/dL (ref 13.0–17.7)
Immature Grans (Abs): 0 10*3/uL (ref 0.0–0.1)
Immature Granulocytes: 0 %
Lymphocytes Absolute: 2.6 10*3/uL (ref 0.7–3.1)
Lymphs: 45 %
MCH: 28.5 pg (ref 26.6–33.0)
MCHC: 31.9 g/dL (ref 31.5–35.7)
MCV: 90 fL (ref 79–97)
Monocytes Absolute: 0.7 10*3/uL (ref 0.1–0.9)
Monocytes: 12 %
Neutrophils Absolute: 1.8 10*3/uL (ref 1.4–7.0)
Neutrophils: 32 %
Platelets: 251 10*3/uL (ref 150–450)
RBC: 4.94 x10E6/uL (ref 4.14–5.80)
RDW: 14.3 % (ref 11.6–15.4)
WBC: 5.8 10*3/uL (ref 3.4–10.8)

## 2023-04-25 LAB — LIPID PANEL
Chol/HDL Ratio: 2.7 {ratio} (ref 0.0–5.0)
Cholesterol, Total: 168 mg/dL (ref 100–199)
HDL: 62 mg/dL (ref >39)
LDL Chol Calc (NIH): 91 mg/dL (ref 0–99)
Triglycerides: 81 mg/dL (ref 0–149)
VLDL Cholesterol Cal: 15 mg/dL (ref 5–40)

## 2023-04-25 LAB — PSA: Prostate Specific Ag, Serum: 1.8 ng/mL (ref 0.0–4.0)

## 2023-04-25 LAB — HEMOGLOBIN A1C
Est. average glucose Bld gHb Est-mCnc: 128 mg/dL
Hgb A1c MFr Bld: 6.1 % — ABNORMAL HIGH (ref 4.8–5.6)

## 2023-04-29 ENCOUNTER — Ambulatory Visit
Payer: Medicare HMO | Attending: Student in an Organized Health Care Education/Training Program | Admitting: Student in an Organized Health Care Education/Training Program

## 2023-04-29 VITALS — BP 134/72 | HR 56 | Temp 98.0°F | Resp 16 | Ht 67.0 in | Wt 180.0 lb

## 2023-04-29 DIAGNOSIS — G894 Chronic pain syndrome: Secondary | ICD-10-CM | POA: Diagnosis not present

## 2023-04-29 DIAGNOSIS — G5603 Carpal tunnel syndrome, bilateral upper limbs: Secondary | ICD-10-CM | POA: Insufficient documentation

## 2023-04-29 MED ORDER — LIDOCAINE HCL 2 % IJ SOLN
20.0000 mL | Freq: Once | INTRAMUSCULAR | Status: AC
  Administered 2023-04-29: 400 mg

## 2023-04-29 MED ORDER — DEXAMETHASONE SODIUM PHOSPHATE 10 MG/ML IJ SOLN
10.0000 mg | Freq: Once | INTRAMUSCULAR | Status: AC
  Administered 2023-04-29: 10 mg

## 2023-04-29 MED ORDER — LIDOCAINE HCL (PF) 2 % IJ SOLN
INTRAMUSCULAR | Status: AC
  Filled 2023-04-29: qty 5

## 2023-04-29 MED ORDER — DEXAMETHASONE SODIUM PHOSPHATE 10 MG/ML IJ SOLN
INTRAMUSCULAR | Status: AC
  Filled 2023-04-29: qty 2

## 2023-04-29 NOTE — Progress Notes (Signed)
Safety precautions to be maintained throughout the outpatient stay will include: orient to surroundings, keep bed in low position, maintain call bell within reach at all times, provide assistance with transfer out of bed and ambulation.  

## 2023-04-29 NOTE — Patient Instructions (Addendum)
______________________________________________________________________    Post-Procedure Discharge Instructions  Instructions: Apply ice:  Purpose: This will minimize any swelling and discomfort after procedure.  When: Day of procedure, as soon as you get home. How: Fill a plastic sandwich bag with crushed ice. Cover it with a small towel and apply to injection site. How long: (15 min on, 15 min off) Apply for 15 minutes then remove x 15 minutes.  Repeat sequence on day of procedure, until you go to bed. Apply heat:  Purpose: To treat any soreness and discomfort from the procedure. When: Starting the next day after the procedure. How: Apply heat to procedure site starting the day following the procedure. How long: May continue to repeat daily, until discomfort goes away. Food intake: Start with clear liquids (like water) and advance to regular food, as tolerated.  Physical activities: Keep activities to a minimum for the first 8 hours after the procedure. After that, then as tolerated. Driving: If you have received any sedation, be responsible and do not drive. You are not allowed to drive for 24 hours after having sedation. Blood thinner: (Applies only to those taking blood thinners) You may restart your blood thinner 6 hours after your procedure. Insulin: (Applies only to Diabetic patients taking insulin) As soon as you can eat, you may resume your normal dosing schedule. Infection prevention: Keep procedure site clean and dry. Shower daily and clean area with soap and water. Post-procedure Pain Diary: Extremely important that this be done correctly and accurately. Recorded information will be used to determine the next step in treatment. For the purpose of accuracy, follow these rules: Evaluate only the area treated. Do not report or include pain from an untreated area. For the purpose of this evaluation, ignore all other areas of pain, except for the treated area. After your procedure,  avoid taking a long nap and attempting to complete the pain diary after you wake up. Instead, set your alarm clock to go off every hour, on the hour, for the initial 8 hours after the procedure. Document the duration of the numbing medicine, and the relief you are getting from it. Do not go to sleep and attempt to complete it later. It will not be accurate. If you received sedation, it is likely that you were given a medication that may cause amnesia. Because of this, completing the diary at a later time may cause the information to be inaccurate. This information is needed to plan your care. Follow-up appointment: Keep your post-procedure follow-up evaluation appointment after the procedure (usually 2 weeks for most procedures, 6 weeks for radiofrequencies). DO NOT FORGET to bring you pain diary with you.   Expect: (What should I expect to see with my procedure?) From numbing medicine (AKA: Local Anesthetics): Numbness or decrease in pain. You may also experience some weakness, which if present, could last for the duration of the local anesthetic. Onset: Full effect within 15 minutes of injected. Duration: It will depend on the type of local anesthetic used. On the average, 1 to 8 hours.  From steroids (Applies only if steroids were used): Decrease in swelling or inflammation. Once inflammation is improved, relief of the pain will follow. Onset of benefits: Depends on the amount of swelling present. The more swelling, the longer it will take for the benefits to be seen. In some cases, up to 10 days. Duration: Steroids will stay in the system x 2 weeks. Duration of benefits will depend on multiple posibilities including persistent irritating factors. Side-effects: If  present, they may typically last 2 weeks (the duration of the steroids). Frequent: Cramps (if they occur, drink Gatorade and take over-the-counter Magnesium 450-500 mg once to twice a day); water retention with temporary weight gain;  increases in blood sugar; decreased immune system response; increased appetite. Occasional: Facial flushing (red, warm cheeks); mood swings; menstrual changes. Uncommon: Long-term decrease or suppression of natural hormones; bone thinning. (These are more common with higher doses or more frequent use. This is why we prefer that our patients avoid having any injection therapies in other practices.)  Very Rare: Severe mood changes; psychosis; aseptic necrosis. From procedure: Some discomfort is to be expected once the numbing medicine wears off. This should be minimal if ice and heat are applied as instructed.  Call if: (When should I call?) You experience numbness and weakness that gets worse with time, as opposed to wearing off. New onset bowel or bladder incontinence. (Applies only to procedures done in the spine)  Emergency Numbers: Durning business hours (Monday - Thursday, 8:00 AM - 4:00 PM) (Friday, 9:00 AM - 12:00 Noon): [phone] After hours: [phone] NOTE: If you are having a problem and are unable connect with, or to talk to a provider, then go to your nearest urgent care or emergency department. If the problem is serious and urgent, please call 911. ______________________________________________________________________     ______________________________________________________________________    Preparing for your procedure  Appointments: If you think you may not be able to keep your appointment, call 24-48 hours in advance to cancel. We need time to make it available to others.  During your procedure appointment there will be: No Prescription Refills. No disability issues to discussed. No medication changes or discussions.  Instructions: Food intake: Avoid eating anything solid for at least 8 hours prior to your procedure. Clear liquid intake: You may take clear liquids such as water up to 2 hours prior to your procedure. (No carbonated drinks. No  soda.) Transportation: Unless otherwise stated by your physician, bring a driver. (Driver cannot be a Market researcher, Pharmacist, community, or any other form of public transportation.) Morning Medicines: Except for blood thinners, take all of your other morning medications with a sip of water. Make sure to take your heart and blood pressure medicines. If your blood pressure's lower number is above 100, the case will be rescheduled. Blood thinners: Make sure to stop your blood thinners as instructed.  If you take a blood thinner, but were not instructed to stop it, call our office [phone] and ask to talk to a nurse. Not stopping a blood thinner prior to certain procedures could lead to serious complications. Diabetics on insulin: Notify the staff so that you can be scheduled 1st case in the morning. If your diabetes requires high dose insulin, take only  of your normal insulin dose the morning of the procedure and notify the staff that you have done so. Preventing infections: Shower with an antibacterial soap the morning of your procedure.  Build-up your immune system: Take 1000 mg of Vitamin C with every meal (3 times a day) the day prior to your procedure. Antibiotics: Inform the nursing staff if you are taking any antibiotics or if you have any conditions that may require antibiotics prior to procedures. (Example: recent joint implants)   Pregnancy: If you are pregnant make sure to notify the nursing staff. Not doing so may result in injury to the fetus, including death.  Sickness: If you have a cold, fever, or any active infections, call  and cancel or reschedule your procedure. Receiving steroids while having an infection may result in complications. Arrival: You must be in the facility at least 30 minutes prior to your scheduled procedure. Tardiness: Your scheduled time is also the cutoff time. If you do not arrive at least 15 minutes prior to your procedure, you will be rescheduled.  Children: Do not bring any  children with you. Make arrangements to keep them home. Dress appropriately: There is always a possibility that your clothing may get soiled. Avoid long dresses. Valuables: Do not bring any jewelry or valuables.  Reasons to call and reschedule or cancel your procedure: (Following these recommendations will minimize the risk of a serious complication.) Surgeries: Avoid having procedures within 2 weeks of any surgery. (Avoid for 2 weeks before or after any surgery). Flu Shots: Avoid having procedures within 2 weeks of a flu shots or . (Avoid for 2 weeks before or after immunizations). Barium: Avoid having a procedure within 7-10 days after having had a radiological study involving the use of radiological contrast. (Myelograms, Barium swallow or enema study). Heart attacks: Avoid any elective procedures or surgeries for the initial 6 months after a "Myocardial Infarction" (Heart Attack). Blood thinners: It is imperative that you stop these medications before procedures. Let us know if you if you take any blood thinner.  Infection: Avoid procedures during or within two weeks of an infection (including chest colds or gastrointestinal problems). Symptoms associated with infections include: Localized redness, fever, chills, night sweats or profuse sweating, burning sensation when voiding, cough, congestion, stuffiness, runny nose, sore throat, diarrhea, nausea, vomiting, cold or Flu symptoms, recent or current infections. It is specially important if the infection is over the area that we intend to treat. Heart and lung problems: Symptoms that may suggest an active cardiopulmonary problem include: cough, chest pain, breathing difficulties or shortness of breath, dizziness, ankle swelling, uncontrolled high or unusually low blood pressure, and/or palpitations. If you are experiencing any of these symptoms, cancel your procedure and contact your primary care physician for an evaluation.  Remember:  Regular  Business hours are:  Monday to Thursday 8:00 AM to 4:00 PM  Provider's Schedule: Delano Metz, MD:  Procedure days: Tuesday and Thursday 7:30 AM to 4:00 PM  Edward Jolly, MD:  Procedure days: Monday and Wednesday 7:30 AM to 4:00 PM Last  Updated: 03/12/2023 ______________________________________________________________________

## 2023-04-29 NOTE — Progress Notes (Signed)
PROVIDER NOTE: Interpretation of information contained herein should be left to medically-trained personnel. Specific patient instructions are provided elsewhere under "Patient Instructions" section of medical record. This document was created in part using STT-dictation technology, any transcriptional errors that may result from this process are unintentional.  Patient: Todd Weaver Type: Established DOB: 1950/12/28 MRN: [id number] PCP: Reubin Milan, MD  Service: Procedure DOS: 04/29/2023 Setting: Ambulatory Location: Ambulatory outpatient facility Delivery: Face-to-face Provider: Edward Jolly, MD Specialty: Interventional Pain Management Specialty designation: 09 Location: Outpatient facility Ref. Prov.: Reubin Milan, MD       Interventional Therapy   Primary Reason for Visit: Interventional Pain Management Treatment. CC: Hand Pain (Bilateral right worse)    Procedure:          Anesthesia, Analgesia, Anxiolysis:  Type: Carpal Tunnel Injection (Median Nerve Block)  with ultrasound guidance         Primary Purpose: Diagnostic Target Area: Space parallel to median nerve on ulnar side of carpal tunnel. Approach: Parallel to the palmaris longus tendon, on the ulnar side Region: Palmar aspect of the wrist Level: Between the middle and distal wrist creases Laterality: Bilateral  Type: Local Anesthesia Local Anesthetic: Lidocaine 1-2% Sedation: None  Indication(s):  Analgesia Route: Infiltration ([place]/IM) IV Access: N/A   Position: Sitting with forearm in supine position over mayo tray/table   1. Carpal tunnel syndrome, bilateral   2. Chronic pain syndrome    NAS-11 Pain score:   Pre-procedure: 10-Worst pain ever/10   Post-procedure: 6 /10     H&P (Pre-op Assessment):  Mr. Hemphill is a 72 y.o. (year old), male patient, seen today for interventional treatment. He  has a past surgical history that includes Rotator cuff repair (Right, 2013); Appendectomy (1975); Knee  surgery (Right, age 72); Anal fistulectomy (N/A, 12/21/2014); Hemorrhoid surgery (N/A, 12/21/2014); Coronary/Graft Acute MI Revascularization (N/A, 07/18/2017); LEFT HEART CATH AND CORONARY ANGIOGRAPHY (N/A, 07/18/2017); LEFT HEART CATH AND CORONARY ANGIOGRAPHY (Left, 08/19/2017); CORONARY STENT INTERVENTION (N/A, 09/09/2017); Coronary Ultrasound/IVUS (N/A, 09/09/2017); LEFT HEART CATH AND CORONARY ANGIOGRAPHY (N/A, 11/07/2017); Colonoscopy with propofol (N/A, 02/12/2019); Cardiac catheterization; LEFT HEART CATH AND CORONARY ANGIOGRAPHY (N/A, 10/17/2021); and Anterior cervical decomp/discectomy fusion (N/A, 08/22/2022). Mr. Fairless has a current medication list which includes the following prescription(s): amlodipine, aspirin ec, baclofen, carvedilol, clopidogrel, clotrimazole-betamethasone, ezetimibe, gabapentin, nitroglycerin, omeprazole, rosuvastatin, entresto, tamsulosin, and spironolactone. His primarily concern today is the Hand Pain (Bilateral right worse)  Initial Vital Signs:  Pulse/HCG Rate: (!) 53  Temp: 98 F (36.7 C) Resp: 18 BP: (!) 149/72 SpO2: 98 %  BMI: Estimated body mass index is 28.19 kg/m as calculated from the following:   Height as of this encounter: 5\' 7"  (1.702 m).   Weight as of this encounter: 180 lb (81.6 kg).  Risk Assessment: Allergies: Reviewed. He is allergic to codeine, shellfish allergy, contrast media [iodinated contrast media], and fish allergy.  Allergy Precautions: None required Coagulopathies: Reviewed. None identified.  Blood-thinner therapy: None at this time Active Infection(s): Reviewed. None identified. Mr. Mcindoe is afebrile  Site Confirmation: Mr. Sterner was asked to confirm the procedure and laterality before marking the site Procedure checklist: Completed Consent: Before the procedure and under the influence of no sedative(s), amnesic(s), or anxiolytics, the patient was informed of the treatment options, risks and possible complications. To fulfill our  ethical and legal obligations, as recommended by the American Medical Association's Code of Ethics, I have informed the patient of my clinical impression; the nature and purpose of the treatment or procedure; the  risks, benefits, and possible complications of the intervention; the alternatives, including doing nothing; the risk(s) and benefit(s) of the alternative treatment(s) or procedure(s); and the risk(s) and benefit(s) of doing nothing. The patient was provided information about the general risks and possible complications associated with the procedure. These may include, but are not limited to: failure to achieve desired goals, infection, bleeding, organ or nerve damage, allergic reactions, paralysis, and death. In addition, the patient was informed of those risks and complications associated to the procedure, such as failure to decrease pain; infection; bleeding; organ or nerve damage with subsequent damage to sensory, motor, and/or autonomic systems, resulting in permanent pain, numbness, and/or weakness of one or several areas of the body; allergic reactions; (i.e.: anaphylactic reaction); and/or death. Furthermore, the patient was informed of those risks and complications associated with the medications. These include, but are not limited to: allergic reactions (i.e.: anaphylactic or anaphylactoid reaction(s)); adrenal axis suppression; blood sugar elevation that in diabetics may result in ketoacidosis or comma; water retention that in patients with history of congestive heart failure may result in shortness of breath, pulmonary edema, and decompensation with resultant heart failure; weight gain; swelling or edema; medication-induced neural toxicity; particulate matter embolism and blood vessel occlusion with resultant organ, and/or nervous system infarction; and/or aseptic necrosis of one or more joints. Finally, the patient was informed that Medicine is not an exact science; therefore, there is also  the possibility of unforeseen or unpredictable risks and/or possible complications that may result in a catastrophic outcome. The patient indicated having understood very clearly. We have given the patient no guarantees and we have made no promises. Enough time was given to the patient to ask questions, all of which were answered to the patient's satisfaction. Mr. Martie has indicated that he wanted to continue with the procedure. Attestation: I, the ordering provider, attest that I have discussed with the patient the benefits, risks, side-effects, alternatives, likelihood of achieving goals, and potential problems during recovery for the procedure that I have provided informed consent. Date  Time: 04/29/2023  8:23 AM  Pre-Procedure Preparation:  Monitoring: As per clinic protocol. Respiration, ETCO2, SpO2, BP, heart rate and rhythm monitor placed and checked for adequate function Safety Precautions: Patient was assessed for positional comfort and pressure points before starting the procedure. Time-out: I initiated and conducted the "Time-out" before starting the procedure, as per protocol. The patient was asked to participate by confirming the accuracy of the "Time Out" information. Verification of the correct person, site, and procedure were performed and confirmed by me, the nursing staff, and the patient. "Time-out" conducted as per Joint Commission's Universal Protocol (UP.01.01.01). Time: 0842 Start Time: 0843 hrs.  Description of Procedure:          Area Prepped: Entire palmar aspect of the forearm, wrist, and hand. DuraPrep (Iodine Povacrylex [0.7% available iodine] and Isopropyl Alcohol, 74% w/w) Procedural Technique Safety Precautions: Aspiration looking for blood return was conducted prior to all injections. At no point did we inject any substances, as a needle was being advanced. No attempts were made at seeking any paresthesias. Safe injection practices and needle disposal techniques used.  Medications properly checked for expiration dates. SDV (single dose vial) medications used. Description of the Procedure: Protocol guidelines were followed. The patient was assisted into a comfortable position. The target area was identified and the area prepped in the usual manner. Skin & deeper tissues infiltrated with local anesthetic. Appropriate amount of time allowed to pass for local anesthetics to take  effect. The procedure needles were then advanced to the target area. Proper needle placement secured. Negative aspiration confirmed. Solution injected in intermittent fashion, asking for systemic symptoms every 0.5cc of injectate. The needles were then removed and the area cleansed, making sure to leave some of the prepping solution back to take advantage of its long term bactericidal properties. Vitals:   04/29/23 0831 04/29/23 0855  BP: (!) 149/72 134/72  Pulse: (!) 53 (!) 56  Resp: 18 16  Temp: 98 F (36.7 C)   SpO2: 98% 100%  Weight: 180 lb (81.6 kg)   Height: 5\' 7"  (1.702 m)     Start Time: 0843 hrs. End Time:   hrs. Materials:  Needle(s) Type: Regular needle Gauge: 25G Length: 1.5-in Medication(s): Please see orders for medications and dosing details.  6 cc solution made of 4 cc of 2% lidocaine, 2 cc of Decadron 10 mg/cc.  3 cc injected in the left carpal tunnel, 3 cc injected in the right carpal tunnel  Type of Imaging Technique: Ultrasound Guidance Indication(s): Assistance in needle guidance and placement for procedures requiring needle placement in or near specific anatomical locations not easily accessible without such assistance. Ultrasound Guidance: Permanently recorded images stored by scanning into EMR.   Post-operative Assessment:  Post-procedure Vital Signs:  Pulse/HCG Rate: (!) 56  Temp: 98 F (36.7 C) Resp: 16 BP: 134/72 SpO2: 100 %  EBL: None  Complications: No immediate post-treatment complications observed by team, or reported by patient.  Note:  The patient tolerated the entire procedure well. A repeat set of vitals were taken after the procedure and the patient was kept under observation following institutional policy, for this type of procedure. Post-procedural neurological assessment was performed, showing return to baseline, prior to discharge. The patient was provided with post-procedure discharge instructions, including a section on how to identify potential problems. Should any problems arise concerning this procedure, the patient was given instructions to immediately contact us, at any time, without hesitation. In any case, we plan to contact the patient by telephone for a follow-up status report regarding this interventional procedure.  Comments:  No additional relevant information.  Plan of Care (POC)  Orders:  No orders of the defined types were placed in this encounter.    Medications ordered for procedure: Meds ordered this encounter  Medications   lidocaine (XYLOCAINE) 2 % (with pres) injection 400 mg   dexamethasone (DECADRON) injection 10 mg   dexamethasone (DECADRON) injection 10 mg   Medications administered: We administered lidocaine, dexamethasone, and dexamethasone.  See the medical record for exact dosing, route, and time of administration.  Follow-up plan:   Return in about 2 weeks (around 05/13/2023), or 2 - 3 weeks PPE F2F..       B/L Carpal Tunnel Injection    Recent Visits Date Type Provider Dept  04/18/23 Office Visit Edward Jolly, MD [facility]  Showing recent visits within past 90 days and meeting all other requirements Today's Visits Date Type Provider Dept  04/29/23 Procedure visit Edward Jolly, MD [facility]  Showing today's visits and meeting all other requirements Future Appointments Date Type Provider Dept  05/13/23 Appointment Edward Jolly, MD [facility]  Showing future appointments within next 90 days and meeting all other  requirements  Disposition: Discharge home  Discharge (Date  Time): 04/29/2023; 0856 hrs.   Primary Care Physician: Reubin Milan, MD Location: [facility] Note by: Edward Jolly, MD (TTS technology used. I apologize for any  typographical errors that were not detected and corrected.) Date: 04/29/2023; Time: 9:12 AM  Disclaimer:  Medicine is not an Visual merchandiser. The only guarantee in medicine is that nothing is guaranteed. It is important to note that the decision to proceed with this intervention was based on the information collected from the patient. The Data and conclusions were drawn from the patient's questionnaire, the interview, and the physical examination. Because the information was provided in large part by the patient, it cannot be guaranteed that it has not been purposely or unconsciously manipulated. Every effort has been made to obtain as much relevant data as possible for this evaluation. It is important to note that the conclusions that lead to this procedure are derived in large part from the available data. Always take into account that the treatment will also be dependent on availability of resources and existing treatment guidelines, considered by other Pain Management Practitioners as being common knowledge and practice, at the time of the intervention. For Medico-Legal purposes, it is also important to point out that variation in procedural techniques and pharmacological choices are the acceptable norm. The indications, contraindications, technique, and results of the above procedure should only be interpreted and judged by a Board-Certified Interventional Pain Specialist with extensive familiarity and expertise in the same exact procedure and technique.

## 2023-05-01 ENCOUNTER — Telehealth: Payer: Self-pay

## 2023-05-01 NOTE — Telephone Encounter (Signed)
Post procedure follow up.  LM 

## 2023-05-13 ENCOUNTER — Encounter: Payer: Self-pay | Admitting: Student in an Organized Health Care Education/Training Program

## 2023-05-13 ENCOUNTER — Ambulatory Visit
Payer: Medicare HMO | Attending: Student in an Organized Health Care Education/Training Program | Admitting: Student in an Organized Health Care Education/Training Program

## 2023-05-13 VITALS — BP 128/75 | HR 58 | Temp 98.4°F | Resp 16 | Ht 67.0 in | Wt 187.0 lb

## 2023-05-13 DIAGNOSIS — G5603 Carpal tunnel syndrome, bilateral upper limbs: Secondary | ICD-10-CM | POA: Insufficient documentation

## 2023-05-13 MED ORDER — LIDOCAINE 5 % EX OINT: 1.0000 | TOPICAL_OINTMENT | Freq: Four times a day (QID) | CUTANEOUS | 2 refills | Status: AC | PRN

## 2023-05-13 MED ORDER — DICLOFENAC SODIUM 1 % EX GEL: 4.0000 g | Freq: Four times a day (QID) | CUTANEOUS | 99 refills | Status: AC | PRN

## 2023-05-13 NOTE — Progress Notes (Signed)
Safety precautions to be maintained throughout the outpatient stay will include: orient to surroundings, keep bed in low position, maintain call bell within reach at all times, provide assistance with transfer out of bed and ambulation.  

## 2023-05-13 NOTE — Progress Notes (Signed)
PROVIDER NOTE: Information contained herein reflects review and annotations entered in association with encounter. Interpretation of such information and data should be left to medically-trained personnel. Information provided to patient can be located elsewhere in the medical record under "Patient Instructions". Document created using STT-dictation technology, any transcriptional errors that may result from process are unintentional.    Patient: Todd Weaver  Service Category: E/M  Provider: Edward Jolly, MD  DOB: 09/23/1950  DOS: 05/13/2023  Referring Provider: Reubin Milan, MD  MRN: [id number]  Specialty: Interventional Pain Management  PCP: Reubin Milan, MD  Type: Established Patient  Setting: Ambulatory outpatient    Location: Office  Delivery: Face-to-face     HPI  [name], a 72 y.o. year old male, is here today because of his Carpal tunnel syndrome, bilateral [G56.03]. Mr. Perkowski primary complain today is Wrist Pain (Bilateral )  Pertinent problems: Mr. Preszler does not have any pertinent problems on file. Pain Assessment: Severity of Chronic pain is reported as a 7 /10. Location: Wrist Left, Right/into hands and fingers. Onset: More than a month ago. Quality: Discomfort, Constant, Aching, Tingling. Timing: Constant. Modifying factor(s): hanging off the edge of the bed. Vitals:  height is 5\' 7"  (1.702 m) and weight is 187 lb (84.8 kg). His temporal temperature is 98.4 F (36.9 C). His blood pressure is 128/75 and his pulse is 58 (abnormal). His respiration is 16 and oxygen saturation is 98%.  BMI: Estimated body mass index is 29.29 kg/m as calculated from the following:   Height as of this encounter: 5\' 7"  (1.702 m).   Weight as of this encounter: 187 lb (84.8 kg). Last encounter: 04/18/2023. Last procedure: 04/29/2023.  Reason for encounter: post-procedure evaluation and assessment.  Unfortunately, Mr. Harpenau did not receive any long-term benefit with his carpal  tunnel injections.  He has failed conservative treatment as well with bracing.  His EMG is positive for severe carpal tunnel on the left and moderate carpal tunnel on the right.  Given lack of long term benefit with bracing and carpal tunnel injection, I recommend that he follow-up with Dr. Katrinka Blazing to consider minimally invasive left carpal tunnel decompression as they previously discussed.  Post-procedure evaluation    Procedure:          Anesthesia, Analgesia, Anxiolysis:  Type: Carpal Tunnel Injection (Median Nerve Block)  with ultrasound guidance         Primary Purpose: Diagnostic Target Area: Space parallel to median nerve on ulnar side of carpal tunnel. Approach: Parallel to the palmaris longus tendon, on the ulnar side Region: Palmar aspect of the wrist Level: Between the middle and distal wrist creases Laterality: Bilateral  Type: Local Anesthesia Local Anesthetic: Lidocaine 1-2% Sedation: None  Indication(s):  Analgesia Route: Infiltration ([place]/IM) IV Access: N/A   Position: Sitting with forearm in supine position over mayo tray/table   1. Carpal tunnel syndrome, bilateral   2. Chronic pain syndrome    NAS-11 Pain score:   Pre-procedure: 10-Worst pain ever/10   Post-procedure: 6 /10      Effectiveness:  Initial hour after procedure: 90 %  Subsequent 4-6 hours post-procedure: 90 %  Analgesia past initial 6 hours: 90% for the first 3 days then pain returned Ongoing improvement:  Analgesic:  Back to baseline    ROS  Constitutional: Denies any fever or chills Gastrointestinal: No reported hemesis, hematochezia, vomiting, or acute GI distress Musculoskeletal: Denies any acute onset joint swelling, redness, loss of ROM, or weakness Neurological:  Bilateral hand pain and numbness  Medication Review  amLODipine, aspirin EC, baclofen, carvedilol, clopidogrel, clotrimazole-betamethasone, diclofenac Sodium, ezetimibe, gabapentin, lidocaine, nitroGLYCERIN, omeprazole,  oxybutynin, rosuvastatin, sacubitril-valsartan, spironolactone, and tamsulosin  History Review  Allergy: Mr. Lambrou is allergic to codeine, shellfish allergy, contrast media [iodinated contrast media], and fish allergy. Drug: Mr. Matzen  reports no history of drug use. Alcohol:  reports current alcohol use of about 5.0 - 6.0 standard drinks of alcohol per week. Tobacco:  reports that he quit smoking about 15 years ago. His smoking use included cigarettes. He has been exposed to tobacco smoke. He has quit using smokeless tobacco.  His smokeless tobacco use included chew. Social: Mr. Baumgartner  reports that he quit smoking about 15 years ago. His smoking use included cigarettes. He has been exposed to tobacco smoke. He has quit using smokeless tobacco.  His smokeless tobacco use included chew. He reports current alcohol use of about 5.0 - 6.0 standard drinks of alcohol per week. He reports that he does not use drugs. 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 stable angina pectoris (HCC) (07/18/2017), ED (erectile dysfunction) (06/19/2017), Essential hypertension (06/19/2017), GERD (gastroesophageal reflux disease), Hand dermatitis (07/26/2014), Hemorrhoid, Hyperlipidemia, Hypertension, Impingement syndrome of left shoulder region (10/14/2018), Ischemic cardiomyopathy, Long term current use of antithrombotics/antiplatelets, Lumbar spondylosis with myelopathy  (11/08/2016), MCI (mild cognitive impairment) (06/19/2017), Myelomalacia of cervical cord (HCC), Overweight (BMI 25.0-29.9) (06/19/2017), PAD (peripheral artery disease) (HCC), PAF (paroxysmal atrial fibrillation) (HCC), Rectal polyp, Stroke (HCC) (09/26/2005), Thrombophilia (HCC) (09/29/2020), and Urinary hesitancy (12/26/2020). Surgical: Mr. Austerman  has a past surgical history that includes Rotator cuff repair (Right, 2013); Appendectomy (1975); Knee surgery (Right, age 49); Anal fistulectomy (N/A, 12/21/2014); Hemorrhoid surgery (N/A, 12/21/2014); Coronary/Graft Acute MI Revascularization (N/A, 07/18/2017); LEFT HEART CATH AND CORONARY ANGIOGRAPHY (N/A, 07/18/2017); LEFT HEART CATH AND CORONARY ANGIOGRAPHY (Left, 08/19/2017); CORONARY STENT INTERVENTION (N/A, 09/09/2017); Coronary Ultrasound/IVUS (N/A, 09/09/2017); LEFT HEART CATH AND CORONARY ANGIOGRAPHY (N/A, 11/07/2017); Colonoscopy with propofol (N/A, 02/12/2019); Cardiac catheterization; LEFT HEART CATH AND CORONARY ANGIOGRAPHY (N/A, 10/17/2021); and Anterior cervical decomp/discectomy fusion (N/A, 08/22/2022). Family: family history includes Alzheimer's disease in his father and mother; Hypertension in his father and mother.  Laboratory Chemistry Profile   Renal Lab Results  Component Value Date   BUN 12 04/24/2023   CREATININE 1.22 04/24/2023   BCR 10 04/24/2023   GFRAA 80 04/11/2020   GFRNONAA >60 08/15/2022    Hepatic Lab Results  Component Value Date   AST 37 04/24/2023   ALT 41 04/24/2023   ALBUMIN 4.6 04/24/2023   ALKPHOS 50 04/24/2023    Electrolytes Lab Results  Component Value Date   NA 140 04/24/2023   K 4.4 04/24/2023   CL 103 04/24/2023   CALCIUM 9.6 04/24/2023   MG 1.9 07/20/2017    Bone No results found for: "VD25OH", "VD125OH2TOT", "IO9629BM8", "UX3244WN0", "25OHVITD1", "25OHVITD2", "25OHVITD3", "TESTOFREE", "TESTOSTERONE"  Inflammation (CRP: Acute Phase) (ESR: Chronic Phase) Lab Results  Component Value Date    ESRSEDRATE 2 11/07/2021         Note: Above Lab results reviewed.  Recent Imaging  Review  MR CERVICAL SPINE WO CONTRAST CLINICAL DATA:  Chronic neck and left arm pain. Numbness in both hands. History of prior cervical surgery.  EXAM: MRI CERVICAL SPINE WITHOUT CONTRAST  TECHNIQUE: Multiplanar, multisequence MR imaging of the cervical spine was performed. No intravenous contrast was administered.  COMPARISON:  MRI cervical spine 12/12/2022.  FINDINGS: Alignment: Trace retrolisthesis C3 on C4 is unchanged. Alignment is otherwise maintained.  Vertebrae: No fracture, evidence of discitis, or bone lesion. Again seen is postoperative change of C4-6 discectomy and fusion.  Cord: Normal signal throughout.  Posterior Fossa, vertebral arteries, paraspinal tissues: Negative.  Disc levels:  C2-3: Mild posterior bony ridging without stenosis, unchanged.  C3-4: Mild-to-moderate facet degenerative disease is worse on the left. There is a shallow disc bulge and some uncovertebral disease. The central canal is patent. Mild bilateral foraminal narrowing. No change.  C4-5: Status post discectomy and fusion.  No stenosis or change.  C5-6: Status post discectomy and fusion. The central canal is open. Uncovertebral spurring causes marked bilateral foraminal narrowing. The appearance is unchanged.  C6-7: There is a shallow disc bulge and some uncovertebral disease. The ventral thecal sac is narrowed but not effaced. Mild to moderate foraminal narrowing is worse on the right. No change.  C7-T1: Shallow disc bulge and uncovertebral disease. The central canal is open. Mild bilateral foraminal narrowing again seen. No change.  IMPRESSION: 1. No change in the appearance of the cervical spine since the prior MRI. 2. Status post C4-6 discectomy and fusion. The central canal is open at both levels. Uncovertebral spurring causes marked bilateral foraminal narrowing at C5-6. 3. Mild  bilateral foraminal narrowing C3-4 and C7-T1. Mild to moderate foraminal narrowing at C6-7 is worse on the right.  Electronically Signed   By: Drusilla Kanner M.D.   On: 03/12/2023 13:06 Note: Reviewed        Physical Exam  General appearance: Well nourished, well developed, and well hydrated. In no apparent acute distress Mental status: Alert, oriented x 3 (person, place, & time)       Respiratory: No evidence of acute respiratory distress Eyes: PERLA Vitals: BP 128/75 (BP Location: Left Arm, Patient Position: Sitting, Cuff Size: Normal)   Pulse (!) 58   Temp 98.4 F (36.9 C) (Temporal)   Resp 16   Ht 5\' 7"  (1.702 m)   Wt 187 lb (84.8 kg)   SpO2 98%   BMI 29.29 kg/m  BMI: Estimated body mass index is 29.29 kg/m as calculated from the following:   Height as of this encounter: 5\' 7"  (1.702 m).   Weight as of this encounter: 187 lb (84.8 kg). Ideal: Ideal body weight: 66.1 kg (145 lb 11.6 oz) Adjusted ideal body weight: 73.6 kg (162 lb 3.8 oz)  Cervical Spine Area Exam  Skin & Axial Inspection: Well healed scar from previous spine surgery detected Alignment: Symmetrical Functional ROM: Unrestricted ROM      Stability: No instability detected Muscle Tone/Strength: Functionally intact. No obvious neuro-muscular anomalies detected. Sensory (Neurological): Unimpaired Palpation: No palpable anomalies             Upper Extremity (UE) Exam      Side: Right upper extremity   Side: Left upper extremity  Skin & Extremity Inspection: Skin color, temperature, and hair growth are WNL. No peripheral edema or cyanosis. No masses, redness, swelling, asymmetry, or associated skin lesions. No contractures.   Skin & Extremity Inspection: Skin color, temperature, and hair growth are WNL. No peripheral edema or cyanosis.  No masses, redness, swelling, asymmetry, or associated skin lesions. No contractures.  Functional ROM: Unrestricted ROM           Functional ROM: Unrestricted ROM          Muscle  Tone/Strength: Functionally intact. No obvious neuro-muscular anomalies detected.   Muscle Tone/Strength: Functionally intact. No obvious neuro-muscular anomalies detected.  Sensory (Neurological): Unimpaired           Sensory (Neurological): Unimpaired          Palpation: No palpable anomalies               Palpation: No palpable anomalies              Provocative Test(s):  Phalen's test: (+) for CTS Tinel's test: (+) for CTS Apley's scratch test (touch opposite shoulder):  Action 1 (Across chest): deferred Action 2 (Overhead): deferred Action 3 (LB reach): deferred     Provocative Test(s):  Phalen's test: (+) for CTS Tinel's test: (+) for CTS Apley's scratch test (touch opposite shoulder):  Action 1 (Across chest): deferred Action 2 (Overhead): deferred Action 3 (LB reach): deferred      Assessment   Diagnosis Status  1. Carpal tunnel syndrome, bilateral    Persistent     Plan of Care   I recommend patient follow-up with Dr. Katrinka Blazing to discuss carpal tunnel decompression He states that gabapentin at 300 mg 3 times a day was resulting in cognitive side effects and making him somewhat drowsy.  He is taking it at night.  I instructed him to continue. We discussed application of Voltaren gel and lidocaine ointment to bilateral wrist.  Pharmacotherapy (Medications Ordered): Meds ordered this encounter  Medications   diclofenac Sodium (VOLTAREN) 1 % GEL    Sig: Apply 4 g topically 4 (four) times daily as needed.    Dispense:  350 g    Refill:  PRN   lidocaine (XYLOCAINE) 5 % ointment    Sig: Apply 1 Application topically 4 (four) times daily as needed for moderate pain (pain score 4-6). Maximum dose: 5 g/application (approximately 6 inches of ointment); 20 g/day    Dispense:  35.44 g    Refill:  2    Fill one day early if pharmacy is closed on scheduled refill date. May substitute for generic if available.   Orders:  No orders of the defined types were placed in this  encounter.  Follow-up plan:   Return for PRN.      B/L Carpal Tunnel Injection     Recent Visits Date Type Provider Dept  04/29/23 Procedure visit Edward Jolly, MD [facility]  04/18/23 Office Visit Edward Jolly, MD [facility]  Showing recent visits within past 90 days and meeting all other requirements Today's Visits Date Type Provider Dept  05/13/23 Office Visit Edward Jolly, MD [facility]  Showing today's visits and meeting all other requirements Future Appointments No visits were found meeting these conditions. Showing future appointments within next 90 days and meeting all other requirements  I discussed the assessment and treatment plan with the patient. The patient was provided an opportunity to ask questions and all were answered. The patient agreed with the plan and demonstrated an understanding of the instructions.  Patient advised to call back or seek an in-person evaluation if the symptoms or condition worsens.  Duration of encounter: .  Total time on encounter, as per AMA guidelines included both the face-to-face and non-face-to-face time personally spent by the physician  and/or other qualified health care professional(s) on the day of the encounter (includes time in activities that require the physician or other qualified health care professional and does not include time in activities normally performed by clinical staff). Physician's time may include the following activities when performed: Preparing to see the patient (e.g., pre-charting review of records, searching for previously ordered imaging, lab work, and nerve conduction tests) Review of prior analgesic pharmacotherapies. Reviewing PMP Interpreting ordered tests (e.g., lab work, imaging, nerve conduction tests) Performing post-procedure evaluations, including interpretation of diagnostic procedures Obtaining and/or reviewing separately obtained history Performing a  medically appropriate examination and/or evaluation Counseling and educating the patient/family/caregiver Ordering medications, tests, or procedures Referring and communicating with other health care professionals (when not separately reported) Documenting clinical information in the electronic or other health record Independently interpreting results (not separately reported) and communicating results to the patient/ family/caregiver Care coordination (not separately reported)  Note by: Edward Jolly, MD Date: 05/13/2023; Time: 1:37 PM

## 2023-05-14 ENCOUNTER — Other Ambulatory Visit: Payer: Self-pay | Admitting: Internal Medicine

## 2023-05-22 ENCOUNTER — Encounter: Payer: Self-pay | Admitting: Urgent Care

## 2023-05-22 ENCOUNTER — Encounter: Payer: Self-pay | Admitting: Neurosurgery

## 2023-05-22 ENCOUNTER — Telehealth: Payer: Self-pay | Admitting: *Deleted

## 2023-05-22 ENCOUNTER — Other Ambulatory Visit: Payer: Self-pay

## 2023-05-22 ENCOUNTER — Ambulatory Visit: Payer: Medicare HMO | Admitting: Neurosurgery

## 2023-05-22 ENCOUNTER — Encounter
Admission: RE | Admit: 2023-05-22 | Discharge: 2023-05-22 | Disposition: A | Discharge status: Home / Self Care | Payer: Medicare HMO | Source: Ambulatory Visit | Attending: Neurosurgery | Admitting: Neurosurgery

## 2023-05-22 VITALS — BP 128/80 | Ht 67.0 in | Wt 187.0 lb

## 2023-05-22 DIAGNOSIS — I493 Ventricular premature depolarization: Secondary | ICD-10-CM | POA: Insufficient documentation

## 2023-05-22 DIAGNOSIS — G5603 Carpal tunnel syndrome, bilateral upper limbs: Secondary | ICD-10-CM | POA: Diagnosis not present

## 2023-05-22 DIAGNOSIS — Z01810 Encounter for preprocedural cardiovascular examination: Secondary | ICD-10-CM | POA: Diagnosis not present

## 2023-05-22 DIAGNOSIS — Z01818 Encounter for other preprocedural examination: Secondary | ICD-10-CM

## 2023-05-22 NOTE — Telephone Encounter (Signed)
Pre-operative Risk Assessment    Patient Name: Todd Weaver  DOB: 11-13-1950 MRN: [id number]  DATE OF LAST VISIT: 08/27/22 DR. GOLLAN DATE OF NEXT VISIT: NONE  PER ERNEST DICK, NP,PRE OP APPT TODAY TO ADD PT ON FRIDAY 06/03/23 FOR TELE VISIT FOR PRE OP CLEARANCE.     Request for Surgical Clearance    Procedure:   Left carpal tunnel release with ultrasound guidance, possible convert to open  Date of Surgery:  Clearance 05/28/23                                 Surgeon:  DR. Katrinka Blazing Surgeon's Group or Practice Name:   Phone number:  [phone] Fax number:   [phone]    Type of Clearance Requested:   - Medical  - Pharmacy:  Hold Clopidogrel (Plavix) PER SURGEON'S OFFICE TODAY; pt did take Plavix today though Dr Katrinka Blazing said he shouldn't take anymore doses of plavix and he can resume it 7 days after surgery.   PT CAN REMAIN ON ASA PER SURGEON'S OFFICE    Type of Anesthesia:  MAC   Additional requests/questions:    Todd Weaver   05/22/2023, 2:41 PM

## 2023-05-22 NOTE — Telephone Encounter (Signed)
Pt has been added on 05/24/23 per Robin Searing, NP due to procedure date and med hold. See notes on clearance about Plavix and ASA.

## 2023-05-22 NOTE — Telephone Encounter (Signed)
Left message to call back to schedule a tele pre op appt; per Robin Searing, NP to add the pt on Friday 05/24/23. Our office was contacted by secure chat that they just scheduled the pt for surgery for 05/28/23 and need cardiac clearance.

## 2023-05-22 NOTE — Patient Instructions (Signed)
Please see below for information in regards to your upcoming surgery:   Planned surgery: Left carpal tunnel release with ultrasound guidance, possible convert to open   Surgery date: 05/28/23 at [facility] ([facility]: [address] [place], [place] [address]) - you will find out your arrival time the business day before your surgery.   Pre-op appointment at [facility] Pre-admit Testing: Go today for EKG. You will have a phone interview before surgery. We will call you with a date/time for this. If you are scheduled for an in person appointment, Pre-admit Testing is located on the first floor of the [facility] building, [address], [address].  During this appointment, they will advise you which medications you can take the morning of surgery, and which medications you will need to hold for surgery.  Should you need to change your pre-op appointment, please call Pre-admit testing at [phone].     Blood thinners:    Aspirin: ok to stay on aspirin 81mg        Plavix:     Stop Plavix 7 days prior, resume 7 days after. Because you already took dose 05/22/23, do not take anymore doses until 7 days after surgery    Surgical clearance: we will send a clearance form to Dr Mariah Milling. They may wish to see you in their office prior to signing the clearance form. If so, they may call you to schedule an appointment.     How to contact us:  If you have any questions/concerns before or after surgery, you can reach [place] at [phone], or you can send a mychart message. We can be reached by phone or mychart 8am-4pm, Monday-Friday.  *Please note: Calls after 4pm are forwarded to a third party answering service. Mychart messages are not routinely monitored during evenings, weekends, and holidays. Please call our office to contact the answering service for urgent concerns during non-business hours.    If you have FMLA/disability paperwork, please drop it  off or fax it to [phone], attention Patty.   Appointments/FMLA & disability paperwork: Joycelyn Rua, & Flonnie Hailstone Registered Nurse/Surgery scheduler: Royston Cowper Medical Assistants: Nash Mantis Physician Assistants: Manning Charity, PA-C & Drake Leach, PA-C Surgeons: Venetia Night, MD & Ernestine Mcmurray, MD

## 2023-05-22 NOTE — Progress Notes (Signed)
Referring Physician:  Reubin Milan, MD [address] [place],  [place] [address]  Primary Physician:  Reubin Milan, MD  History of Present Illness: 05/22/2023 Todd Weaver is here today with a chief complaint of bilateral carpal tunnel syndrome.  He has had the symptoms for many years.  He is also had cervical radiculopathy which was overlying.  He said previous decompressive surgery for this.  He states that he wakes up almost nightly having to shake out his hands.  He gets numbness and tingling and burning sensation in the lateral 3 and half digits, left worse than right.  He has had a recent EMG nerve conduction study which demonstrated severe carpal tunnel on the left and moderate carpal tunnel on the right.  He states that he has tried bracing at night which did not help.  He comes to back to [place] after trying carpal tunnel injections.  States that these worked for approximately 3 to 4 days and then continued to worsen.  His symptoms continue to be severe.  He wakes up nightly and has to shake them out.  There is interfering with his sleep.  Review of Systems:  A 10 point review of systems is negative, except for the pertinent positives and negatives detailed in the HPI.  Past Medical History: Past Medical History:  Diagnosis Date   Acute ST elevation myocardial infarction (STEMI) of inferior wall (HCC) 07/18/2017   a.) LHC/PCI 07/18/2017: 99% o-mRCA --> overlapping 3.0 x 38 mm and 3.25 x 18 mm Xience Sierra DES   Alcohol dependence, daily use (HCC)    a.) 3-4 beers daily   Angina pectoris (HCC) 11/06/2017   BPH (benign prostatic hyperplasia) 06/19/2017   Cardiac arrest with ventricular fibrillation (HCC) 07/18/2017   a.) in setting of acute inferior STEMI; required defib in field by EMS and again in the ED   Carpal tunnel syndrome of right wrist 12/26/2020   Cervical spinal stenosis    Chronic deep vein thrombosis (DVT) of brachial vein of left upper  extremity (HCC)    Chronic systolic congestive heart failure (HCC) 09/29/2019   a.) TTE 09/29/2019: EF 45-50%, glob HK, mild MR, mild PA dil, G2DD; b.) TTE 11/28/2021: EF 45-50%, glob HK, G2DD; c.) TTE 04/18/2022: EF 45-50%, glob HK, G2DD   Coronary artery disease of native artery of native heart with stable angina pectoris (HCC) 07/18/2017   a.) LHC/PCI 07/18/17: 99 o-mRCA (overlapping 3.0x39mm & 3.25x65mm Xience Sierra DES), 60 post atrio, 85 dLM-oLAD, 40 pLCx, 60 pLAD, 40 mLAD; b.) LHC 08/19/17: 85 dLM-oLAD (PTCA), 60 pLAD, 30 mLAD, 40 pLCx, 50 RI; c.) LHC/PCI 09/09/17: unchanged LHC. 3.0 x 28mm Xience Sierra DES dLM-oLAD; d.) LHC 11/07/17: 30 mLM, 40 pLCx, 70 RI, 40 dLAD - Rx mgmt; e.) LHC 10/17/21: 70 oRI, 20 mLAD, 30 pLCx - Rx mgmt   ED (erectile dysfunction) 06/19/2017   a.) on PDE5i (sildenafil)   Essential hypertension 06/19/2017   GERD (gastroesophageal reflux disease)    Hand dermatitis 07/26/2014   Hemorrhoid    Hyperlipidemia    Hypertension    Impingement syndrome of left shoulder region 10/14/2018   Ischemic cardiomyopathy    a.) TTE 07/18/2017: EF 45-50%, mod inf/infsept HK, mild LAE, LVH; b.) LHC 08/19/2017: EF 45%; c.) TTE 09/29/2019: EF 45-50%, glob HK, G2DD; d.) MPI 01/18/2021: EF 47%; e.) TTE 11/28/2021: EF 45-50%, glob HK, G2DD; f.) TTE 04/18/2022:  EF 45-50%, glob HK, G2DD   Long term current use  of antithrombotics/antiplatelets    a.) on DAPT (ASA + clopidogrel)   Lumbar spondylosis with myelopathy 11/08/2016   MCI (mild cognitive impairment) 06/19/2017   Myelomalacia of cervical cord (HCC)    Overweight (BMI 25.0-29.9) 06/19/2017   PAD (peripheral artery disease) (HCC)    a. 01/2019 ABIs and Duplex: ABI R 0.78, L 0.79; TBI R 0.66, L 1.11. RCIA >50p, RSFA 50-74. LCIA >50p, LSFA 75-99p, 30-49d. 3 vessel runoff bilat.   PAF (paroxysmal atrial fibrillation) (HCC)    a. Brief episode during December hospitalization-->converted on IV amio-->not on OAC.   Rectal polyp     Stroke (HCC) 09/26/2005   a.) seen acutely on brain MRI 09/26/2005 --> posterior frontal, mid parietal lobe, RIGHT occipital lobe --> tiny ischemic infarctions secondary to a shower of emboli  (watershed ischemic pattern may be present)   Thrombophilia (HCC) 09/29/2020   Urinary hesitancy 12/26/2020    Past Surgical History: Past Surgical History:  Procedure Laterality Date   ANAL FISTULECTOMY N/A 12/21/2014   Procedure: FISTULECTOMY ANAL;  Surgeon: Kieth Brightly, MD;  Location: [facility] ORS;  Service: General;  Laterality: N/A;   ANTERIOR CERVICAL DECOMP/DISCECTOMY FUSION N/A 08/22/2022   Procedure: C4-6 ANTERIOR CERVICAL DISCECTOMY AND FUSION (NUVASIVE ACP, GLOBUS HEDRON);  Surgeon: Venetia Night, MD;  Location: [facility] ORS;  Service: Neurosurgery;  Laterality: N/A;   APPENDECTOMY  1975   CARDIAC CATHETERIZATION     COLONOSCOPY WITH PROPOFOL N/A 02/12/2019   Procedure: COLONOSCOPY WITH PROPOFOL;  Surgeon: Midge Minium, MD;  Location: [facility] ENDOSCOPY;  Service: Endoscopy;  Laterality: N/A;   CORONARY STENT INTERVENTION N/A 09/09/2017   Procedure: CORONARY STENT INTERVENTION;  Surgeon: Iran Ouch, MD;  Location: [facility] INVASIVE CV LAB;  Service: Cardiovascular;  Laterality: N/A;   CORONARY ULTRASOUND/IVUS N/A 09/09/2017   Procedure: Intravascular Ultrasound/IVUS;  Surgeon: Iran Ouch, MD;  Location: [facility] INVASIVE CV LAB;  Service: Cardiovascular;  Laterality: N/A;   CORONARY/GRAFT ACUTE MI REVASCULARIZATION N/A 07/18/2017   Procedure: Coronary/Graft Acute MI Revascularization;  Surgeon: Iran Ouch, MD;  Location: [facility] INVASIVE CV LAB;  Service: Cardiovascular;  Laterality: N/A;   HEMORRHOID SURGERY N/A 12/21/2014   Procedure: HEMORRHOIDECTOMY;  Surgeon: Kieth Brightly, MD;  Location: [facility] ORS;  Service: General;  Laterality: N/A;   KNEE SURGERY Right age 33   LEFT HEART CATH AND CORONARY ANGIOGRAPHY N/A 07/18/2017   Procedure: LEFT HEART CATH AND CORONARY  ANGIOGRAPHY;  Surgeon: Iran Ouch, MD;  Location: [facility] INVASIVE CV LAB;  Service: Cardiovascular;  Laterality: N/A;   LEFT HEART CATH AND CORONARY ANGIOGRAPHY Left 08/19/2017   Procedure: LEFT HEART CATH AND CORONARY ANGIOGRAPHY;  Surgeon: Iran Ouch, MD;  Location: [facility] INVASIVE CV LAB;  Service: Cardiovascular;  Laterality: Left;   LEFT HEART CATH AND CORONARY ANGIOGRAPHY N/A 11/07/2017   Procedure: LEFT HEART CATH AND CORONARY ANGIOGRAPHY;  Surgeon: Iran Ouch, MD;  Location: [facility] INVASIVE CV LAB;  Service: Cardiovascular;  Laterality: N/A;   LEFT HEART CATH AND CORONARY ANGIOGRAPHY N/A 10/17/2021   Procedure: LEFT HEART CATH AND CORONARY ANGIOGRAPHY;  Surgeon: Iran Ouch, MD;  Location: [facility] INVASIVE CV LAB;  Service: Cardiovascular;  Laterality: N/A;   ROTATOR CUFF REPAIR Right 2013    Allergies: Allergies as of 05/22/2023 - Review Complete 05/22/2023  Allergen Reaction Noted   Codeine Hives and Other (See Comments) 04/06/2019   Shellfish allergy Hives and Swelling 11/08/2014   Contrast media [iodinated contrast media] Itching and Other (See Comments) 08/22/2017   Fish  allergy Itching 02/05/2019    Medications:  Current Outpatient Medications:    aspirin EC 81 MG tablet, Take 81 mg by mouth daily. , Disp: , Rfl:    carvedilol (COREG) 3.125 MG tablet, TAKE 1 TABLET TWICE DAILY WITH MEALS, Disp: 180 tablet, Rfl: 3   clopidogrel (PLAVIX) 75 MG tablet, Take 75 mg by mouth daily., Disp: , Rfl:    clotrimazole-betamethasone (LOTRISONE) cream, Apply 1 Application topically 2 (two) times daily. (Patient taking differently: Apply 1 Application topically as needed.), Disp: 45 g, Rfl: 1   diclofenac Sodium (VOLTAREN) 1 % GEL, Apply 4 g topically 4 (four) times daily as needed., Disp: 350 g, Rfl: PRN   ezetimibe (ZETIA) 10 MG tablet, Take 1 tablet (10 mg total) by mouth daily., Disp: 90 tablet, Rfl: 3   gabapentin (NEURONTIN) 300 MG capsule, TAKE 1 CAPSULE(300 MG) BY  MOUTH THREE TIMES DAILY, Disp: 90 capsule, Rfl: 1   lidocaine (XYLOCAINE) 5 % ointment, Apply 1 Application topically 4 (four) times daily as needed for moderate pain (pain score 4-6). Maximum dose: 5 g/application (approximately 6 inches of ointment); 20 g/day, Disp: 35.44 g, Rfl: 2   nitroGLYCERIN (NITROSTAT) 0.4 MG SL tablet, Place 1 tablet (0.4 mg total) under the tongue every 5 (five) minutes as needed for chest pain., Disp: 30 tablet, Rfl: 0   omeprazole (PRILOSEC) 40 MG capsule, Take 1 capsule (40 mg total) by mouth daily., Disp: 90 capsule, Rfl: 3   oxybutynin (DITROPAN-XL) 10 MG 24 hr tablet, Take 10 mg by mouth daily., Disp: , Rfl:    rosuvastatin (CRESTOR) 40 MG tablet, TAKE 1 TABLET EVERY DAY, Disp: 90 tablet, Rfl: 3   sacubitril-valsartan (ENTRESTO) 49-51 MG, Take 1 tablet by mouth 2 (two) times daily., Disp: 180 tablet, Rfl: 3   tamsulosin (FLOMAX) 0.4 MG CAPS capsule, Take 1 capsule (0.4 mg total) by mouth daily., Disp: 90 capsule, Rfl: 3   spironolactone (ALDACTONE) 25 MG tablet, Take 1 tablet (25 mg total) by mouth daily., Disp: 90 tablet, Rfl: 3  Social History: Social History   Tobacco Use   Smoking status: Former    Current packs/day: 0.00    Types: Cigarettes    Quit date: 07/24/2007    Years since quitting: 15.8    Passive exposure: Past   Smokeless tobacco: Former    Types: Chew   Tobacco comments:    smoking cessation materials not required  Vaping Use   Vaping status: Never Used  Substance Use Topics   Alcohol use: Yes    Alcohol/week: 5.0 - 6.0 standard drinks of alcohol    Types: 5 - 6 Cans of beer per week    Comment:  weekly   Drug use: No    Family Medical History: Family History  Problem Relation Age of Onset   Hypertension Mother    Alzheimer's disease Mother    Hypertension Father    Alzheimer's disease Father    Breast cancer Neg Hx     Physical Examination: Vitals:   05/22/23 0932  BP: 128/80    General: Patient is in no apparent  distress. Attention to examination is appropriate.  Respiratory: Patient is breathing without any difficulty.   NEUROLOGICAL:     Awake, alert, oriented to person, place, and time.  Speech is clear and fluent.   Cranial Nerves: Pupils equal round and reactive to light.  Facial tone is symmetric. Shoulder shrug is symmetric. Tongue protrusion is midline.  There is no pronator drift.  Motor  Exam:  Median motor weakness noted on the intrinsic examination, left worse than right with more left-sided wasting than right sided wasting.  Decreased median sensation on the left worse than right, splitting of the ring finger.  Phalen's and reverse Phalen's are positive, carpal compression test positive bilaterally.  Medical Decision Making   Electrodiagnostics:  [facility]  [address] [place], [address]  [place], [place] [address] Tel: [phone] Fax: [phone] Test Date:  02/26/2023   Patient: Todd Weaver DOB: 1950-12-10 Physician: Jacquelyne Balint, MD  Sex: Male Height: 5\' 7"  Ref Phys: Venetia Night, MD  ID#: [id number]     Technician:      History: This is a 72 year old male with hand numbness, pain, and weakness.   NCV & EMG Findings: Extensive electrodiagnostic evaluation of bilateral upper limbs shows: Left median sensory response is absent. Right median sensory response shows prolonged distal peak latency (5.2 ms) and reduced amplitude (7 V). Bilateral ulnar and radial sensory responses are within normal limits. Bilateral median (APB) motor responses show prolonged distal onset latency (L6.8, R5.1 ms). Chronic motor axon loss changes without active denervation changes are seen in the right triceps muscle. All other tested muscles are within normal limits with normal motor unit configuration and recruitment patterns. Cervical paraspinal muscles were deferred due to prior cervical spine surgery.   Impression: This is an abnormal study. The findings are most  consistent with the following: Evidence of bilateral median mononeuropathy at or distal to the wrist, consistent with carpal tunnel syndrome. The findings are severe in degree electrically on the left and moderate in degree electrically on the left. No electrodiagnostic evidence of a right or left ulnar mononeuropathy. No definitive electrodiagnostic evidence of a right or left cervical (C5-T1) motor radiculopathy. Minimal chronic neurogenic changes isolated to the right triceps muscle are seen on needle examination, with no active or ongoing changes, that is of unclear significance. This finding is too limited in degree and distribution for diagnostic purposes.       ___________________________ Jacquelyne Balint, MD     Nerve Conduction Studies Motor Nerve Results                 Latency Amplitude F-Lat Segment Distance CV Comment  Site (ms) Norm (mV) Norm (ms)   (cm) (m/s) Norm    Left Median (APB) Motor  Wrist *6.8  < 4.0 5.2  > 5.0              Elbow 12.2 - 5.2 -   Elbow-Wrist 30 56  > 50    Right Median (APB) Motor  Wrist *5.1  < 4.0 7.4  > 5.0              Elbow 10.5 - 7.1 -   Elbow-Wrist 28.5 53  > 50    Left Ulnar (ADM) Motor  Wrist 2.2  < 3.1 10.0  > 7.0              Bel elbow 6.1 - 10.0 -   Bel elbow-Wrist 22 56  > 50    Ab elbow 7.9 - 9.6 -   Ab elbow-Bel elbow 10 56 -    Right Ulnar (ADM) Motor  Wrist 2.2  < 3.1 12.8  > 7.0              Bel elbow 6.6 - 11.6 -   Bel elbow-Wrist 23.5 53  > 50    Ab elbow 8.6 - 10.9 -  Ab elbow-Bel elbow 10 50 -      Sensory Sites               Neg Peak Lat Amplitude (O-P) Segment Distance Velocity Comment  Site (ms) Norm (V) Norm   (cm) (ms)    Left Median Sensory  Wrist-Dig II *NR  < 3.8 *NR  > 10 Wrist-Dig II 13      Right Median Sensory  Wrist-Dig II *5.2  < 3.8 *7  > 10 Wrist-Dig II 13      Left Radial Sensory  Forearm-Wrist 2.4  < 2.8 22  > 10 Forearm-Wrist 10      Right Radial Sensory  Forearm-Wrist 2.4  < 2.8 23  > 10  Forearm-Wrist 10      Left Ulnar Sensory  Wrist-Dig V 2.7  < 3.2 13  > 5 Wrist-Dig V 11      Right Ulnar Sensory  Wrist-Dig V 3.0  < 3.2 15  > 5 Wrist-Dig V 11        Electromyography    Side Muscle Ins.Act Fibs Fasc Recrt Amp Dur Poly Activation Comment  Left FDI Nml Nml Nml Nml Nml Nml Nml Nml N/A  Left EIP Nml Nml Nml Nml Nml Nml Nml Nml N/A  Left APB Nml Nml Nml Nml Nml Nml Nml Nml N/A  Left Pronator teres Nml Nml Nml Nml Nml Nml Nml Nml N/A  Left Biceps Nml Nml Nml Nml Nml Nml Nml Nml N/A  Left Triceps Nml Nml Nml Nml Nml Nml Nml Nml N/A  Left Deltoid Nml Nml Nml Nml Nml Nml Nml Nml N/A  Right FDI Nml Nml Nml Nml Nml Nml Nml Nml N/A  Right EIP Nml Nml Nml Nml Nml Nml Nml Nml N/A  Right FPL Nml Nml Nml Nml Nml Nml Nml Nml N/A  Right APB Nml Nml Nml Nml Nml Nml Nml Nml N/A  Right Pronator teres Nml Nml Nml Nml Nml Nml Nml Nml N/A  Right Biceps Nml Nml Nml Nml Nml Nml Nml Nml N/A  Right Triceps Nml Nml Nml *2- *1+ *1+ *1+ Nml N/A  Right Deltoid Nml Nml Nml Nml Nml Nml Nml Nml N/A           I have personally reviewed the images and electrodiagnostics and agree with the above interpretation.  Assessment and Plan: Todd Weaver is a pleasant 72 y.o. male with a history of cervical radiculopathy as well as carpal tunnel syndrome.  He has had his cervical spine managed by Dr. Myer Haff.  He comes to me after diagnosis of bilateral carpal tunnel, left worse than right.  EMG was positive for severe carpal tunnel on the left and moderate carpal tunnel on the right.  He continues to have severe symptoms and would like to be treated.  He has tried bracing as well as injections without any long-term improvement.  Given the severity of his symptoms as well as severity of his electrodiagnostic testing will plan for carpal tunnel release with ultrasound guidance, with a possible conversion to open.  We discussed risk and benefits of surgery including nerve injury, failure to improve symptoms, wound  healing issues, and pillar pain.  He would like to go forward with the procedure.  Will plan on looking for operative time for a left-sided carpal tunnel release with ultrasound guidance, should he have a good result with this we will discuss contralateral treatment.  Thank you for involving me in the care of this patient.  Lovenia Kim MD/MSCR Neurosurgery - Peripheral Nerve Surgery

## 2023-05-22 NOTE — Telephone Encounter (Signed)
Name: RUFUS PRACHT  DOB: 30-Nov-1950  MRN: [id number]  Primary Cardiologist: Lorine Bears, MD   Preoperative team, please contact this patient and set up a phone call appointment for further preoperative risk assessment. Please obtain consent and complete medication review. Thank you for your help.  I confirm that guidance regarding antiplatelet and oral anticoagulation therapy has been completed and, if necessary, noted below.  Per previous request if patient is without any new symptoms or complaints he can hold Plavix 5 days prior to procedure and 7 days after procedure  I also confirmed the patient resides in the state of [place]. As per [facility] Medical Board telemedicine laws, the patient must reside in the state in which the provider is licensed.   Napoleon Form, Leodis Rains, NP 05/22/2023, 3:05 PM [facility]

## 2023-05-22 NOTE — Telephone Encounter (Signed)
Pt has been added on 05/24/23 per Robin Searing, NP due to procedure date and med hold. See notes on clearance about Plavix and ASA.     Patient Consent for Virtual Visit        Todd Weaver has provided verbal consent on 05/22/2023 for a virtual visit (video or telephone).   CONSENT FOR VIRTUAL VISIT FOR:  Todd Weaver  By participating in this virtual visit I agree to the following:  I hereby voluntarily request, consent and authorize [facility] and its employed or contracted physicians, physician assistants, nurse practitioners or other licensed health care professionals (the Practitioner), to provide me with telemedicine health care services (the "Services") as deemed necessary by the treating Practitioner. I acknowledge and consent to receive the Services by the Practitioner via telemedicine. I understand that the telemedicine visit will involve communicating with the Practitioner through live audiovisual communication technology and the disclosure of certain medical information by electronic transmission. I acknowledge that I have been given the opportunity to request an in-person assessment or other available alternative prior to the telemedicine visit and am voluntarily participating in the telemedicine visit.  I understand that I have the right to withhold or withdraw my consent to the use of telemedicine in the course of my care at any time, without affecting my right to future care or treatment, and that the Practitioner or I may terminate the telemedicine visit at any time. I understand that I have the right to inspect all information obtained and/or recorded in the course of the telemedicine visit and may receive copies of available information for a reasonable fee.  I understand that some of the potential risks of receiving the Services via telemedicine include:  Delay or interruption in medical evaluation due to technological equipment failure or disruption; Information  transmitted may not be sufficient (e.g. poor resolution of images) to allow for appropriate medical decision making by the Practitioner; and/or  In rare instances, security protocols could fail, causing a breach of personal health information.  Furthermore, I acknowledge that it is my responsibility to provide information about my medical history, conditions and care that is complete and accurate to the best of my ability. I acknowledge that Practitioner's advice, recommendations, and/or decision may be based on factors not within their control, such as incomplete or inaccurate data provided by me or distortions of diagnostic images or specimens that may result from electronic transmissions. I understand that the practice of medicine is not an exact science and that Practitioner makes no warranties or guarantees regarding treatment outcomes. I acknowledge that a copy of this consent can be made available to me via my patient portal [facility] MyChart), or I can request a printed copy by calling the office of [facility].    I understand that my insurance will be billed for this visit.   I have read or had this consent read to me. I understand the contents of this consent, which adequately explains the benefits and risks of the Services being provided via telemedicine.  I have been provided ample opportunity to ask questions regarding this consent and the Services and have had my questions answered to my satisfaction. I give my informed consent for the services to be provided through the use of telemedicine in my medical care

## 2023-05-24 ENCOUNTER — Ambulatory Visit: Payer: Medicare HMO | Attending: Cardiovascular Disease | Admitting: Student

## 2023-05-24 ENCOUNTER — Telehealth: Payer: Self-pay

## 2023-05-24 ENCOUNTER — Inpatient Hospital Stay: Admission: RE | Admit: 2023-05-24 | Payer: Medicare HMO | Source: Ambulatory Visit

## 2023-05-24 ENCOUNTER — Encounter: Payer: Self-pay | Admitting: Neurosurgery

## 2023-05-24 DIAGNOSIS — Z01810 Encounter for preprocedural cardiovascular examination: Secondary | ICD-10-CM

## 2023-05-24 DIAGNOSIS — G5602 Carpal tunnel syndrome, left upper limb: Secondary | ICD-10-CM

## 2023-05-24 NOTE — Telephone Encounter (Signed)
[employer] does not cover ultrasound guided carpal tunnel release surgery, only open and endoscopic. Surgery has been canceled at this time (see mychart messages).

## 2023-05-24 NOTE — Progress Notes (Signed)
Virtual Visit via Telephone Note   Because of [name]'s co-morbid illnesses, he is at least at moderate risk for complications without adequate follow up.  This format is felt to be most appropriate for this patient at this time.  The patient did not have access to video technology/had technical difficulties with video requiring transitioning to audio format only (telephone).  All issues noted in this document were discussed and addressed.  No physical exam could be performed with this format.  Please refer to the patient's chart for his consent to telehealth for [facility].  Evaluation Performed:  Preoperative cardiovascular risk assessment _____________   Date:  05/24/2023   Patient ID:  Todd Weaver, DOB Feb 27, 1951, MRN [id number] Patient Location:  Home Provider location:   Office  Primary Care Provider:  Reubin Milan, MD Primary Cardiologist:  Lorine Bears, MD  Chief Complaint / Patient Profile   72 y.o. y/o male with a h/o CAD s/p PCI with overlapping DES ostial to proximal RCA December 2018 and PCI with DES 3.0 x 38 mm to ostial to proximal LAD February 2019 (LHC March 2023 showed patent stents), PAD, PAF on anticoagulation, chronic systolic heart failure/ischemic cardiomyopathy, hypertension, hyperlipidemia, chronic DVT, GERD who is pending left carpal tunnel release by Dr. Katrinka Blazing on 05/28/2023 and presents today for telephonic preoperative cardiovascular risk assessment.  History of Present Illness    ROLLIN Weaver is a 72 y.o. male who presents via audio/video conferencing for a telehealth visit today.  Pt was last seen in cardiology clinic on 08/27/2022 by Dr. Mariah Milling.  At that time PANAYIOTIS RAINV[place]LE was stable from a cardiac standpoint.  The patient is now pending procedure as outlined above. Since his last visit, he is doing well. Patient denies shortness of breath, dyspnea on exertion, lower extremity edema, orthopnea or PND. No chest pain, pressure, or  tightness. No palpitations.  He stays very active doing yard work, walking, fishing and other activities most days of the week.   Past Medical History    Past Medical History:  Diagnosis Date   Acute ST elevation myocardial infarction (STEMI) of inferior wall (HCC) 07/18/2017   a.) LHC/PCI 07/18/2017: 99% o-mRCA --> overlapping 3.0 x 38 mm and 3.25 x 18 mm Xience Sierra DES   Alcohol dependence, daily use (HCC)    a.) 3-4 beers daily   Angina pectoris (HCC) 11/06/2017   BPH (benign prostatic hyperplasia) 06/19/2017   Cardiac arrest with ventricular fibrillation (HCC) 07/18/2017   a.) in setting of acute inferior STEMI; required defib in field by EMS and again in the ED   Carpal tunnel syndrome of right wrist 12/26/2020   Cervical spinal stenosis    Chronic deep vein thrombosis (DVT) of brachial vein of left upper extremity (HCC)    Chronic systolic congestive heart failure (HCC) 09/29/2019   a.) TTE 09/29/2019: EF 45-50%, glob HK, mild MR, mild PA dil, G2DD; b.) TTE 11/28/2021: EF 45-50%, glob HK, G2DD; c.) TTE 04/18/2022: EF 45-50%, glob HK, G2DD   Coronary artery disease of native artery of native heart with stable angina pectoris (HCC) 07/18/2017   a.) LHC/PCI 07/18/17: 99 o-mRCA (overlapping 3.0x73mm & 3.25x58mm Xience Sierra DES), 60 post atrio, 85 dLM-oLAD, 40 pLCx, 60 pLAD, 40 mLAD; b.) LHC 08/19/17: 85 dLM-oLAD (PTCA), 60 pLAD, 30 mLAD, 40 pLCx, 50 RI; c.) LHC/PCI 09/09/17: unchanged LHC. 3.0 x 28mm Xience Sierra DES dLM-oLAD; d.) LHC 11/07/17: 30 mLM, 40 pLCx, 70 RI, 40 dLAD -  Rx mgmt; e.) LHC 10/17/21: 70 oRI, 20 mLAD, 30 pLCx - Rx mgmt   ED (erectile dysfunction) 06/19/2017   a.) on PDE5i (sildenafil)   Essential hypertension 06/19/2017   GERD (gastroesophageal reflux disease)    Hand dermatitis 07/26/2014   Hemorrhoid    Hyperlipidemia    Hypertension    Impingement syndrome of left shoulder region 10/14/2018   Ischemic cardiomyopathy    a.) TTE 07/18/2017: EF 45-50%, mod  inf/infsept HK, mild LAE, LVH; b.) LHC 08/19/2017: EF 45%; c.) TTE 09/29/2019: EF 45-50%, glob HK, G2DD; d.) MPI 01/18/2021: EF 47%; e.) TTE 11/28/2021: EF 45-50%, glob HK, G2DD; f.) TTE 04/18/2022:  EF 45-50%, glob HK, G2DD   Long term current use of antithrombotics/antiplatelets    a.) on DAPT (ASA + clopidogrel)   Lumbar spondylosis with myelopathy 11/08/2016   MCI (mild cognitive impairment) 06/19/2017   Myelomalacia of cervical cord (HCC)    Overweight (BMI 25.0-29.9) 06/19/2017   PAD (peripheral artery disease) (HCC)    a. 01/2019 ABIs and Duplex: ABI R 0.78, L 0.79; TBI R 0.66, L 1.11. RCIA >50p, RSFA 50-74. LCIA >50p, LSFA 75-99p, 30-49d. 3 vessel runoff bilat.   PAF (paroxysmal atrial fibrillation) (HCC)    a. Brief episode during December hospitalization-->converted on IV amio-->not on OAC.   Rectal polyp    Stroke (HCC) 09/26/2005   a.) seen acutely on brain MRI 09/26/2005 --> posterior frontal, mid parietal lobe, RIGHT occipital lobe --> tiny ischemic infarctions secondary to a shower of emboli  (watershed ischemic pattern may be present)   Thrombophilia (HCC) 09/29/2020   Urinary hesitancy 12/26/2020   Past Surgical History:  Procedure Laterality Date   ANAL FISTULECTOMY N/A 12/21/2014   Procedure: FISTULECTOMY ANAL;  Surgeon: Kieth Brightly, MD;  Location: [facility] ORS;  Service: General;  Laterality: N/A;   ANTERIOR CERVICAL DECOMP/DISCECTOMY FUSION N/A 08/22/2022   Procedure: C4-6 ANTERIOR CERVICAL DISCECTOMY AND FUSION (NUVASIVE ACP, GLOBUS HEDRON);  Surgeon: Venetia Night, MD;  Location: [facility] ORS;  Service: Neurosurgery;  Laterality: N/A;   APPENDECTOMY  1975   CARDIAC CATHETERIZATION     COLONOSCOPY WITH PROPOFOL N/A 02/12/2019   Procedure: COLONOSCOPY WITH PROPOFOL;  Surgeon: Midge Minium, MD;  Location: [facility] END[facility];  Service: Endoscopy;  Laterality: N/A;   CORONARY STENT INTERVENTION N/A 09/09/2017   Procedure: CORONARY STENT INTERVENTION;  Surgeon: Iran Ouch, MD;  Location: [facility] INVASIVE CV LAB;  Service: Cardiovascular;  Laterality: N/A;   CORONARY ULTRASOUND/IVUS N/A 09/09/2017   Procedure: Intravascular Ultrasound/IVUS;  Surgeon: Iran Ouch, MD;  Location: [facility] INVASIVE CV LAB;  Service: Cardiovascular;  Laterality: N/A;   CORONARY/GRAFT ACUTE MI REVASCULARIZATION N/A 07/18/2017   Procedure: Coronary/Graft Acute MI Revascularization;  Surgeon: Iran Ouch, MD;  Location: [facility] INVASIVE CV LAB;  Service: Cardiovascular;  Laterality: N/A;   HEMORRHOID SURGERY N/A 12/21/2014   Procedure: HEMORRHOIDECTOMY;  Surgeon: Kieth Brightly, MD;  Location: [facility] ORS;  Service: General;  Laterality: N/A;   KNEE SURGERY Right age 42   LEFT HEART CATH AND CORONARY ANGIOGRAPHY N/A 07/18/2017   Procedure: LEFT HEART CATH AND CORONARY ANGIOGRAPHY;  Surgeon: Iran Ouch, MD;  Location: [facility] INVASIVE CV LAB;  Service: Cardiovascular;  Laterality: N/A;   LEFT HEART CATH AND CORONARY ANGIOGRAPHY Left 08/19/2017   Procedure: LEFT HEART CATH AND CORONARY ANGIOGRAPHY;  Surgeon: Iran Ouch, MD;  Location: [facility] INVAS[facility] CV LAB;  Service: Cardiovascular;  Laterality: Left;   LEFT HEART CATH AND CORONARY ANGIOGRAPHY N/A 11/07/2017  Procedure: LEFT HEART CATH AND CORONARY ANGIOGRAPHY;  Surgeon: Iran Ouch, MD;  Location: [facility] INV[facility] CV [facility];  Service: Cardiovascular;  Laterality: N/A;   LEFT HEART CATH AND CORONARY ANGIOGRAPHY N/A 10/17/2021   Procedure: LEFT HEART CATH AND CORONARY ANGIOGRAPHY;  Surgeon: Iran Ouch, MD;  Location: [facility] INVASIVE CV LAB;  Service: Cardiovascular;  Laterality: N/A;   ROTATOR CUFF REPAIR Right 2013    Allergies  Allergies  Allergen Reactions   Codeine Hives and Other (See Comments)   Shellfish Allergy Hives and Swelling   Contrast Media [Iodinated Contrast Media] Itching and Other (See Comments)    Whelts on tongue   Fish Allergy Itching    Home Medications    Prior to Admission  medications   Medication Sig Start Date End Date Taking? Authorizing Provider  aspirin EC 81 MG tablet Take 81 mg by mouth daily.  05/23/09   [provider]  carvedilol (COREG) 3.125 MG tablet TAKE 1 TABLET TWICE DAILY WITH MEALS 01/04/23   Antonieta Iba, MD  clopidogrel (PLAVIX) 75 MG tablet Take 75 mg by mouth daily.    [provider]  clotrimazole-betamethasone (LOTRISONE) cream Apply 1 Application topically 2 (two) times daily. Patient taking differently: Apply 1 Application topically as needed. 04/17/22   Reubin Milan, MD  diclofenac Sodium (VOLTAREN) 1 % GEL Apply 4 g topically 4 (four) times daily as needed. 05/13/23 11/09/23  Edward Jolly, MD  ezetimibe (ZETIA) 10 MG tablet Take 1 tablet (10 mg total) by mouth daily. 04/24/23   Reubin Milan, MD  gabapentin (NEURONTIN) 300 MG capsule TAKE 1 CAPSULE(300 MG) BY MOUTH THREE TIMES DAILY 03/19/23   Drake Leach, PA-C  lidocaine (XYLOCAINE) 5 % ointment Apply 1 Application topically 4 (four) times daily as needed for moderate pain (pain score 4-6). Maximum dose: 5 g/application (approximately 6 inches of ointment); 20 g/day 05/13/23 08/11/23  Edward Jolly, MD  nitroGLYCERIN (NITROSTAT) 0.4 MG SL tablet Place 1 tablet (0.4 mg total) under the tongue every 5 (five) minutes as needed for chest pain. 06/18/22   Reubin Milan, MD  omeprazole (PRILOSEC) 40 MG capsule Take 1 capsule (40 mg total) by mouth daily. 04/24/23   Reubin Milan, MD  oxybutynin (DITROPAN-XL) 10 MG 24 hr tablet Take 10 mg by mouth daily. 05/08/23   [provider]  rosuvastatin (CRESTOR) 40 MG tablet TAKE 1 TABLET EVERY DAY 02/06/23   Reubin Milan, MD  sacubitril-valsartan (ENTRESTO) 49-51 MG Take 1 tablet by mouth 2 (two) times daily. 11/19/22   Antonieta Iba, MD  spironolactone (ALDACTONE) 25 MG tablet Take 1 tablet (25 mg total) by mouth daily. 05/18/22 04/24/23  Sondra Barges, PA-C  tamsulosin (FLOMAX) 0.4 MG CAPS capsule Take 1  capsule (0.4 mg total) by mouth daily. 12/18/22   Sondra Come, MD    Physical Exam    Vital Signs:  KELII CHITTUM does not have vital signs available for review today.  Given telephonic nature of communication, physical exam is limited. AAOx3. NAD. Normal affect.  Speech and respirations are unlabored.  Accessory Clinical Findings    None  Assessment & Plan    Primary Cardiologist: Lorine Bears, MD  Preoperative cardiovascular risk assessment.  Left carpal tunnel release by Dr. Katrinka Blazing on 05/28/2023.  Chart reviewed as part of pre-operative protocol coverage. According to the RCRI, patient has a 6.6% risk of MACE. Patient reports activity equivalent to >4.0 METS (walking, yard work, fishing).  Given past medical history and time since last visit, based on ACC/AHA guidelines, [employer] would be at acceptable risk for the planned procedure without further cardiovascular testing.   Patient was advised that if he develops new symptoms prior to surgery to contact our office to arrange a follow-up appointment.  he verbalized understanding.  Per Dr. Kirke Corin, he may hold Plavix for 5 days prior to procedure and 7 days after procedure.  Patient to continue aspirin 81 mg daily throughout perioperative period.  I will route this recommendation to the requesting party via Epic fax function.  Please call with questions.  Time:   Today, I have spent 5 minutes with the patient with telehealth technology discussing medical history, symptoms, and management plan.     Carlos Levering, NP  05/24/2023, 8:33 AM

## 2023-05-24 NOTE — Telephone Encounter (Signed)
I spoke with Todd Weaver and explained where we stand with his insurance. He would like to get this taken care of ASAP. He would like to know what can he do/take for pain in the meantime? He is currently taking gabapentin 300mg  TID and tylenol PRN.

## 2023-05-24 NOTE — Telephone Encounter (Signed)
[employer] does not cover ultrasound guided carpal tunnel release surgery, only open and endoscopic. Surgery has been canceled at this time (see mychart messages from today for additional details).

## 2023-05-27 NOTE — Telephone Encounter (Signed)
Per discussion with Dr Katrinka Blazing, he agrees with gabapentin 300mg  TID and tylenol. He can also add NSAIDS. I discussed this with Mr Mahany. I also notified him that we received the appeal letter from [facility], which states the appeal to switch to an open carpal tunnel release can take up to 30 days. The recovery for open carpal tunnel surgery is about 6 weeks. However, Dr Katrinka Blazing spoke with Dr Joice Lofts, who can do endoscopic carpal tunnel surgery, which would be a 2-3 week recovery. Dr Joice Lofts has offered to see Mr Padula ASAP. Mr Cerullo is agreeable to a referral to Dr Joice Lofts. Referral has been placed.

## 2023-05-27 NOTE — Addendum Note (Signed)
Addended by: Sharlot Gowda on: 05/27/2023 10:26 AM   Modules accepted: Orders

## 2023-05-28 ENCOUNTER — Ambulatory Visit: Admission: RE | Admit: 2023-05-28 | Payer: Medicare HMO | Source: Home / Self Care | Admitting: Neurosurgery

## 2023-05-28 ENCOUNTER — Encounter: Admission: RE | Payer: Self-pay | Source: Home / Self Care

## 2023-05-28 SURGERY — CARPAL TUNNEL RELEASE
Anesthesia: Monitor Anesthesia Care | Laterality: Left

## 2023-06-10 DIAGNOSIS — G5603 Carpal tunnel syndrome, bilateral upper limbs: Secondary | ICD-10-CM | POA: Diagnosis not present

## 2023-06-11 ENCOUNTER — Encounter: Payer: Medicare HMO | Admitting: Orthopedic Surgery

## 2023-06-26 ENCOUNTER — Other Ambulatory Visit: Payer: Self-pay | Admitting: Surgery

## 2023-06-28 ENCOUNTER — Encounter
Admission: RE | Admit: 2023-06-28 | Discharge: 2023-06-28 | Disposition: A | Discharge status: Home / Self Care | Payer: Medicare HMO | Source: Ambulatory Visit | Attending: Surgery | Admitting: Surgery

## 2023-06-28 HISTORY — DX: Personal history of nicotine dependence: Z87.891

## 2023-06-28 HISTORY — DX: Carpal tunnel syndrome, left upper limb: G56.02

## 2023-06-28 NOTE — Patient Instructions (Addendum)
Your procedure is scheduled on:07-04-23 Thursday Report to the Registration Desk on the 1st floor of the [facility].Then proceed to the 2nd floor Surgery Desk To find out your arrival time, please call [phone] between 1PM - 3PM on:07-03-23 Wednesday If your arrival time is 6:00 am, do not arrive before that time as the [facility] entrance doors do not open until 6:00 am.  REMEMBER: Instructions that are not followed completely may result in serious medical risk, up to and including death; or upon the discretion of your surgeon and anesthesiologist your surgery may need to be rescheduled.  Do not eat food after midnight the night before surgery.  No gum chewing or hard candies.  You may however, drink CLEAR liquids up to 2 hours before you are scheduled to arrive for your surgery. Do not drink anything within 2 hours of your scheduled arrival time.  Clear liquids include: - water  - apple juice without pulp - gatorade (not RED colors) - black coffee or tea (Do NOT add milk or creamers to the coffee or tea) Do NOT drink anything that is not on this list.  In addition, your doctor has ordered for you to drink the provided:  Ensure Pre-Surgery Clear Carbohydrate Drink  Drinking this carbohydrate drink up to two hours before surgery helps to reduce insulin resistance and improve patient outcomes. Please complete drinking 2 hours before scheduled arrival time.  One week prior to surgery:Stop NOW (06-28-23) Stop Anti-inflammatories (NSAIDS) such as Advil, Aleve, Ibuprofen, Motrin, Naproxen, Naprosyn and Aspirin based products such as Excedrin, Goody's Powder, BC Powder. Stop ANY OVER THE COUNTER supplements until after surgery.  You may however, continue to take Tylenol if needed for pain up until the day of surgery.  Stop clopidogrel (PLAVIX) 5 days prior to surgery-Last dose will be on 06-28-23 Friday  Continue taking all of your other prescription medications up until the day of  surgery.  ON THE DAY OF SURGERY ONLY TAKE THESE MEDICATIONS WITH SIPS OF WATER: -amLODipine (NORVASC)  -carvedilol (COREG)  -ezetimibe (ZETIA)  -gabapentin (NEURONTIN)  -oxybutynin (DITROPAN-XL)  -tamsulosin (FLOMAX)   Continue your 81 mg Aspirin up until the day prior to surgery-Do NOT take the morning of surgery  No Alcohol for 24 hours before or after surgery.  No Smoking including e-cigarettes for 24 hours before surgery.  No chewable tobacco products for at least 6 hours before surgery.  No nicotine patches on the day of surgery.  Do not use any "recreational" drugs for at least a week (preferably 2 weeks) before your surgery.  Please be advised that the combination of cocaine and anesthesia may have negative outcomes, up to and including death. If you test positive for cocaine, your surgery will be cancelled.  On the morning of surgery brush your teeth with toothpaste and water, you may rinse your mouth with mouthwash if you wish. Do not swallow any toothpaste or mouthwash.  Use CHG Soap as directed on instruction sheet.  Do not wear jewelry, make-up, hairpins, clips or nail polish.  For welded (permanent) jewelry: bracelets, anklets, waist bands, etc.  Please have this removed prior to surgery.  If it is not removed, there is a chance that hospital personnel will need to cut it off on the day of surgery.  Do not wear lotions, powders, or perfumes.   Do not shave body hair from the neck down 48 hours before surgery.  Contact lenses, hearing aids and dentures may not be worn into surgery.  Do not bring valuables to the hospital. [facility] is not responsible for any missing/lost belongings or valuables.   Notify your doctor if there is any change in your medical condition (cold, fever, infection).  Wear comfortable clothing (specific to your surgery type) to the hospital.  After surgery, you can help prevent lung complications by doing breathing exercises.  Take deep  breaths and cough every 1-2 hours. Your doctor may order a device called an Incentive Spirometer to help you take deep breaths. When coughing or sneezing, hold a pillow firmly against your incision with both hands. This is called "splinting." Doing this helps protect your incision. It also decreases belly discomfort.  If you are being admitted to the hospital overnight, leave your suitcase in the car. After surgery it may be brought to your room.  In case of increased patient census, it may be necessary for you, the patient, to continue your postoperative care in the Same Day Surgery department.  If you are being discharged the day of surgery, you will not be allowed to drive home. You will need a responsible individual to drive you home and stay with you for 24 hours after surgery.   If you are taking public transportation, you will need to have a responsible individual with you.  Please call the Pre-admissions Testing Dept. at [phone] if you have any questions about these instructions.  Surgery Visitation Policy:  Patients having surgery or a procedure may have two visitors.  Children under the age of 94 must have an adult with them who is not the patient.     Preparing for Surgery with CHLORHEXIDINE GLUCONATE (CHG) Soap  Chlorhexidine Gluconate (CHG) Soap  o An antiseptic cleaner that kills germs and bonds with the skin to continue killing germs even after washing  o Used for showering the night before surgery and morning of surgery  Before surgery, you can play an important role by reducing the number of germs on your skin.  CHG (Chlorhexidine gluconate) soap is an antiseptic cleanser which kills germs and bonds with the skin to continue killing germs even after washing.  Please do not use if you have an allergy to CHG or antibacterial soaps. If your skin becomes reddened/irritated stop using the CHG.  1. Shower the NIGHT BEFORE SURGERY and the MORNING OF SURGERY with  CHG soap.  2. If you choose to wash your hair, wash your hair first as usual with your normal shampoo.  3. After shampooing, rinse your hair and body thoroughly to remove the shampoo.  4. Use CHG as you would any other liquid soap. You can apply CHG directly to the skin and wash gently with a scrungie or a clean washcloth.  5. Apply the CHG soap to your body only from the neck down. Do not use on open wounds or open sores. Avoid contact with your eyes, ears, mouth, and genitals (private parts). Wash face and genitals (private parts) with your normal soap.  6. Wash thoroughly, paying special attention to the area where your surgery will be performed.  7. Thoroughly rinse your body with warm water.  8. Do not shower/wash with your normal soap after using and rinsing off the CHG soap.  9. Pat yourself dry with a clean towel.  10. Wear clean pajamas to bed the night before surgery.  12. Place clean sheets on your bed the night of your first shower and do not sleep with pets.  13. Shower again with the CHG soap  on the day of surgery prior to arriving at the hospital.  14. Do not apply any deodorants/lotions/powders.  15. Please wear clean clothes to the hospital.  How to Use an Incentive Spirometer An incentive spirometer is a tool that measures how well you are filling your lungs with each breath. Learning to take long, deep breaths using this tool can help you keep your lungs clear and active. This may help to reverse or lessen your chance of developing breathing (pulmonary) problems, especially infection. You may be asked to use a spirometer: After a surgery. If you have a lung problem or a history of smoking. After a long period of time when you have been unable to move or be active. If the spirometer includes an indicator to show the highest number that you have reached, your health care provider or respiratory therapist will help you set a goal. Keep a log of your progress as told  by your health care provider. What are the risks? Breathing too quickly may cause dizziness or cause you to pass out. Take your time so you do not get dizzy or light-headed. If you are in pain, you may need to take pain medicine before doing incentive spirometry. It is harder to take a deep breath if you are having pain. How to use your incentive spirometer  Sit up on the edge of your bed or on a chair. Hold the incentive spirometer so that it is in an upright position. Before you use the spirometer, breathe out normally. Place the mouthpiece in your mouth. Make sure your lips are closed tightly around it. Breathe in slowly and as deeply as you can through your mouth, causing the piston or the ball to rise toward the top of the chamber. Hold your breath for 3-5 seconds, or for as long as possible. If the spirometer includes a coach indicator, use this to guide you in breathing. Slow down your breathing if the indicator goes above the marked areas. Remove the mouthpiece from your mouth and breathe out normally. The piston or ball will return to the bottom of the chamber. Rest for a few seconds, then repeat the steps 10 or more times. Take your time and take a few normal breaths between deep breaths so that you do not get dizzy or light-headed. Do this every 1-2 hours when you are awake. If the spirometer includes a goal marker to show the highest number you have reached (best effort), use this as a goal to work toward during each repetition. After each set of 10 deep breaths, cough a few times. This will help to make sure that your lungs are clear. If you have an incision on your chest or abdomen from surgery, place a pillow or a rolled-up towel firmly against the incision when you cough. This can help to reduce pain while taking deep breaths and coughing. General tips When you are able to get out of bed: Walk around often. Continue to take deep breaths and cough in order to clear your  lungs. Keep using the incentive spirometer until your health care provider says it is okay to stop using it. If you have been in the hospital, you may be told to keep using the spirometer at home. Contact a health care provider if: You are having difficulty using the spirometer. You have trouble using the spirometer as often as instructed. Your pain medicine is not giving enough relief for you to use the spirometer as told. You have a fever.  Get help right away if: You develop shortness of breath. You develop a cough with bloody mucus from the lungs. You have fluid or blood coming from an incision site after you cough. Summary An incentive spirometer is a tool that can help you learn to take long, deep breaths to keep your lungs clear and active. You may be asked to use a spirometer after a surgery, if you have a lung problem or a history of smoking, or if you have been inactive for a long period of time. Use your incentive spirometer as instructed every 1-2 hours while you are awake. If you have an incision on your chest or abdomen, place a pillow or a rolled-up towel firmly against your incision when you cough. This will help to reduce pain. Get help right away if you have shortness of breath, you cough up bloody mucus, or blood comes from your incision when you cough. This information is not intended to replace advice given to you by your health care provider. Make sure you discuss any questions you have with your health care provider. Document Revised: 09/28/2019 Document Reviewed: 09/28/2019 Elsevier Patient Education  2024 [employer].

## 2023-07-02 ENCOUNTER — Encounter: Payer: Self-pay | Admitting: Surgery

## 2023-07-02 NOTE — Progress Notes (Signed)
Perioperative / Anesthesia Services  Pre-Admission Testing Clinical Review / Pre-Operative Anesthesia Consult  Date: 07/02/23  Patient Demographics:  Name: Todd Weaver DOB:   [birth year] MRN:   [id number]  Planned Surgical Procedure(s):    Case: [id number] Date/Time: 07/04/23 1438   Procedure: CARPAL TUNNEL RELEASE ENDOSCOPIC (Left: Wrist)   Anesthesia type: Choice   Pre-op diagnosis: CARPAL TUNNEL SYNDROME, LEFT   Location: [facility] OR ROOM 02 / [facility]S FOR ANESTHESIA [facility]   Surgeons: Christena Flake, MD     NOTE: Available PAT nursing documentation and vital signs have been reviewed. Clinical nursing staff has updated patient's PMH/PSHx, current medication list, and drug allergies/intolerances to ensure comprehensive history available to assist in medical decision making as it pertains to the aforementioned surgical procedure and anticipated anesthetic course. Extensive review of available clinical information personally performed. [place] PMH and PSHx updated with any diagnoses/procedures that  may have been inadvertently omitted during his intake with the pre-admission testing department's nursing staff.  Clinical Discussion:  Todd Weaver is a 72 y.o. male who is submitted for pre-surgical anesthesia review and clearance prior to him undergoing the above procedure. Patient is a Former Smoker (quit 07/2007). Pertinent PMH includes: CAD, inferior STEMI, bradycardia, angina, ventricular fibrillation cardiac arrest, ischemic cardiomyopathy, HFrEF, PAF, CVA, PAD, DVT, HTN, HLD, GERD (no daily Tx), thrombophilia, OA, BILATERAL carpal tunnel syndrome, cervical spinal stenosis (s/p ACDF C4-C6), lumbar spondylosis with myelopathy, ED (on PDE5i), BPH, mild cognitive impairment, ETOH use.   Patient is followed by cardiology Kirke Corin, MD). He was last seen in the cardiology clinic on 08/27/2022; notes reviewed. At the time of his clinic visit, patient doing well overall from a cardiovascular  perspective. Patient experiencing lightheadedness associated with position changes. He also complained of minor chest pain, however noted that the symptom had improved overall. Patient denied any shortness of breath, PND, orthopnea, palpitations, significant peripheral edema, weakness, fatigue, or presyncope/syncope. Patient with a past medical history significant for cardiovascular diagnoses. Documented physical exam was grossly benign, providing no evidence of acute exacerbation and/or decompensation of the patient's known cardiovascular conditions.  Patient suffered an inferior wall STEMI on 07/18/2017.    In the setting of his inferior STEMI, patient developed ventricular fibrillation cardiac arrest requiring defibrillation x 1 in the field by EMS and then again upon presentation to the emergency department.  Defibrillation x 2 to restored stable perfusing rhythm.   He underwent diagnostic LEFT heart catheterization on 07/18/2017 revealing multivessel CAD; 99% ostial to mid RCA, 60% post atrial, 85% distal LM-ostial LAD, 40% proximal LCx, 60% proximal LAD, and 40% mid LAD.  PCI was performed placing overlapping 3.0 x 38 mm and 3.25 x 18 mm Xience Sierra stents x 2 to the RCA yielding excellent angiographic result and TIMI-3 flow.    During his hospitalization for his STEMI, patient developed a brief episode of paroxysmal atrial fibrillation.  Atrial dysrhythmia was treated with intravenous amiodarone, which restored NSR.  Medication was discontinued prior to discharge. Patient was not started on long-term oral anticoagulation therapy. Per his reports, he has not had an known recurrence of his atrial arrhythmia.    Repeat diagnostic LEFT heart catheterization on 08/19/2017 revealing multivessel CAD; 85% distal LM-ostial LAD, 60% proximal LAD, 40% mid LAD, 40% proximal LCx, and 50% ostial ramus intermedius.  Previously placed stents were noted be widely patent.  PTCA was performed.  Plans are for staged  PCI.   Patient underwent staged PCI procedure on 09/09/2017 placing a 3.0  x 38 mm Xience Sierra DES to the distal LM-ostial LAD lesion yielding excellent angiographic result and TIMI-3 flow.   Repeat diagnostic LEFT heart catheterization was performed on 11/07/2017 revealing multivessel CAD; 30% mid LAD, 40% proximal LCx, 70% ostial ramus intermedius, and 40% distal LAD.  Previously placed stents noted to be widely patent.  Further intervention was deferred opting for medical management.   Most recent cardiac catheterization was performed on 10/17/2021 revealing multivessel CAD; 70% ostial ramus intermedius, 20% mid LAD, and 30% proximal LCx.  Again, all previously placed stents were noted to be widely patent.  Further intervention was deferred opting for continued aggressive medical management for coronary artery disease.   Long-term cardiac event monitor study was performed on 01/05/2022 revealing a predominant underlying sinus rhythm with an average rate of 66 bpm; range 42-136 bpm.  There was 1 run of SVT lasting 6 beats with a maximum rate of 145 bpm.  Rare atrial and ventricular ectopy with a total study burden of <1% noted.  There were no significant arrhythmias or pauses noted.   Most recent TTE was performed on 04/18/2022 revealing a mildly reduced left ventricular systolic function with an EF of 45-50%.  There was global hypokinesis. Left ventricular diastolic Doppler parameters consistent with pseudonormalization (G2DD).  Right ventricular size and function normal.  No significant valvular regurgitation noted.  All transvalvular gradients were noted to be normal providing no evidence suggestive of valvular stenosis. Aorta normal in size with no evidence of aneurysmal dilatation.  Given his PVD and previous STEMI, patient remains on daily DAPT therapy (ASA + clopidogrel).  Patient reportedly compliant with therapy with no evidence or reports of GI/GU related bleeding.  Patient with an ischemic  cardiomyopathy diagnosis with resulting HFrEF.  Exam did not reveal any concerning findings for decompensation.  Heart failure and blood pressure well-managed on currently prescribed GDMT, including CCB (amlodipine), beta-blocker (carvedilol), ARB/ANRi (Entresto) therapies.  Patient is on rosuvastatin + ezetimibe for his HLD diagnosis and further ASCVD prevention.  Patient has a supply of short acting nitrates to use on an as needed basis for recurrent angina/anginal equivalent symptoms; denied recent use.  Of note, in the setting of known ischemic cardiomyopathy, it is important to note that patient is on a PDE5i (sildenafil) for and erectile dysfunction diagnosis.  He is not diabetic. Patient does not have an OSAH diagnosis. Patient is able to complete all of his  ADL/IADLs without cardiovascular limitation.  Per the DASI, patient is able to achieve at least 4 METS of physical activity without experiencing any significant degree of angina/anginal equivalent symptoms. Due to his chronic bradycardia and reports of being lightheaded, recommendations regarding timing of amlodipine (take in the am) were made. Dose also decreased to 2.5 mg daily. No other changes were made to his medication regimen.  Patient to follow-up with outpatient cardiology in 1 year or sooner if needed.   Ardell Isaacs is scheduled for an elective CARPAL TUNNEL RELEASE ENDOSCOPIC (Left: Wrist) on 07/04/2023 with Dr. Leron Croak, MD.  Given patient's past medical history significant for cardiovascular diagnoses, presurgical cardiac clearance was sought by the PAT team. Per cardiology, "according to the RCRI, patient has a 6.6% risk of MACE. Patient reports activity equivalent to >4.0 METS (walking, yard work, fishing). Given past medical history and time since last visit, based on ACC/AHA guidelines, he would be at ACCEPTABLE risk for the planned procedure without further cardiovascular testing".  Again, this patient is on daily oral DAPT  therapy.  He has been instructed on recommendations for holding his clopidogrel for 5 days prior to his procedure with plans to restart since postoperatively respectively minimized by primary attending surgeon.  Patient is aware that his last dose of clopidogrel should be on 06/28/2023.  Given his cardiovascular history, patient will continue his daily low-dose ASA throughout his perioperative course.  Patient denies previous perioperative complications with anesthesia in the past. In review of the available records, it is noted that patient underwent a general anesthetic course here at [facility] ([facility] III) in 07/2022 without documented complications.       06/28/2023    4:00 PM 05/22/2023    9:32 AM 05/13/2023   12:51 PM  Vitals with BMI  Height 5\' 7"  5\' 7"  5\' 7"   Weight 181 lbs 187 lbs 187 lbs  BMI 28.34 29.28 29.28  Systolic  128 128  Diastolic  80 75  Pulse   58    Providers/Specialists:   NOTE: Primary physician provider listed below. Patient may have been seen by APP or partner within same practice.   PROVIDER ROLE / SPECIALTY LAST OV  Poggi, Excell Seltzer, MD Orthopedics (Surgeon)  06/10/2023  Reubin Milan, MD Primary Care Provider  04/24/2023  Lorine Bears, MD Cardiology  08/27/2022; update call with preop APP on 05/24/2023  Edward Jolly, MD Pain Management 05/13/2023   Allergies:  Codeine, Shellfish allergy, Contrast media [iodinated contrast media], and Fish allergy  Current Home Medications:   No current facility-administered medications for this encounter.    amLODipine (NORVASC) 2.5 MG tablet   aspirin EC 81 MG tablet   carvedilol (COREG) 3.125 MG tablet   clopidogrel (PLAVIX) 75 MG tablet   clotrimazole-betamethasone (LOTRISONE) cream   diclofenac Sodium (VOLTAREN) 1 % GEL   ezetimibe (ZETIA) 10 MG tablet   gabapentin (NEURONTIN) 300 MG capsule   methocarbamol (ROBAXIN) 500 MG tablet   nitroGLYCERIN (NITROSTAT) 0.4 MG SL  tablet   omeprazole (PRILOSEC) 40 MG capsule   oxybutynin (DITROPAN-XL) 10 MG 24 hr tablet   rosuvastatin (CRESTOR) 40 MG tablet   sacubitril-valsartan (ENTRESTO) 49-51 MG   tamsulosin (FLOMAX) 0.4 MG CAPS capsule   lidocaine (XYLOCAINE) 5 % ointment   History:   Past Medical History:  Diagnosis Date   Acute ST elevation myocardial infarction (STEMI) of inferior wall (HCC) 07/18/2017   a.) LHC/PCI 07/18/2017: 99% o-mRCA --> overlapping 3.0 x 38 mm and 3.25 x 18 mm Xience Sierra DES   Alcohol dependence, daily use (HCC)    a.) 3-4 beers daily   Angina pectoris (HCC) 11/06/2017   Bilateral carpal tunnel syndrome    BPH (benign prostatic hyperplasia) 06/19/2017   Bradycardia    Cardiac arrest with ventricular fibrillation (HCC) 07/18/2017   a.) in setting of acute inferior STEMI; required defib in field by EMS and again in the ED   Cervical spinal stenosis    a.) s/p ACDF C4-C6 08/22/2022   Chronic deep vein thrombosis (DVT) of brachial vein of left upper extremity (HCC)    Chronic systolic congestive heart failure (HCC) 09/29/2019   a.) TTE 09/29/2019: EF 45-50%, glob HK, mild MR, mild PA dil, G2DD; b.) TTE 11/28/2021: EF 45-50%, glob HK, G2DD; c.) TTE 04/18/2022: EF 45-50%, glob HK, G2DD   Coronary artery disease of native artery of native heart with stable angina pectoris (HCC) 07/18/2017   a.) LHC/PCI 07/18/17: 99 o-mRCA (overlapping 3.0x60mm & 3.25x27mm Xience Sierra DES), 60 post atrio, 85 dLM-oLAD, 40 pLCx, 60  pLAD, 40 mLAD; b.) LHC 08/19/17: 85 dLM-oLAD (PTCA), 60 pLAD, 30 mLAD, 40 pLCx, 50 RI; c.) LHC/PCI 09/09/17: unchanged LHC. 3.0 x 28mm Xience Sierra DES dLM-oLAD; d.) LHC 11/07/17: 30 mLM, 40 pLCx, 70 RI, 40 dLAD - Rx mgmt; e.) LHC 10/17/21: 70 oRI, 20 mLAD, 30 pLCx - Rx mgmt   ED (erectile dysfunction) 06/19/2017   a.) on PDE5i (sildenafil)   Former smoker    GERD (gastroesophageal reflux disease)    Hand dermatitis 07/26/2014   Hemorrhoid    Hyperlipidemia    Hypertension     Impingement syndrome of left shoulder region 10/14/2018   Ischemic cardiomyopathy    a.) TTE 07/18/2017: EF 45-50%; b.) LHC 08/19/2017: EF 45%; c.) TTE 09/29/2019: EF 45-50%; d.) MPI 01/18/2021: EF 47%; e.) TTE 11/28/2021: EF 45-50%; f.) TTE 04/18/2022:  EF 45-50%   Long term current use of aspirin    Long term current use of clopidogrel    Lumbar spondylosis with myelopathy 11/08/2016   MCI (mild cognitive impairment) 06/19/2017   Myelomalacia of cervical cord (HCC)    Overweight (BMI 25.0-29.9) 06/19/2017   PAD (peripheral artery disease) (HCC)    a. 01/2019 ABIs and Duplex: ABI R 0.78, L 0.79; TBI R 0.66, L 1.11. RCIA >50p, RSFA 50-74. LCIA >50p, LSFA 75-99p, 30-49d. 3 vessel runoff bilat.   PAF (paroxysmal atrial fibrillation) (HCC)    a.) brief episode during 06/2017 hospitalization for inferior STEMI --> converted to NSR with IV amiodarone --> not on [facility]   Rectal polyp    Stroke (HCC) 09/26/2005   a.) seen acutely on brain MRI 09/26/2005 --> posterior frontal, mid parietal lobe, RIGHT occipital lobe --> tiny ischemic infarctions secondary to a shower of emboli  (watershed ischemic pattern may be present)   Subareolar gynecomastia in male    Thrombophilia (HCC) 09/29/2020   Urinary hesitancy 12/26/2020   Past Surgical History:  Procedure Laterality Date   ANAL FISTULECTOMY N/A 12/21/2014   Procedure: FISTULECTOMY ANAL;  Surgeon: Kieth Brightly, MD;  Location: [facility] ORS;  Service: General;  Laterality: N/A;   ANTERIOR CERVICAL DECOMP/DISCECTOMY FUSION N/A 08/22/2022   Procedure: C4-6 ANTERIOR CERVICAL DISCECTOMY AND FUSION (NUVASIVE ACP, GLOBUS HEDRON);  Surgeon: Venetia Night, MD;  Location: [facility] ORS;  Service: Neurosurgery;  Laterality: N/A;   APPENDECTOMY  1975   CARDIAC CATHETERIZATION     COLONOSCOPY WITH PROPOFOL N/A 02/12/2019   Procedure: COLONOSCOPY WITH PROPOFOL;  Surgeon: Midge Minium, MD;  Location: [facility] ENDOSCOPY;  Service: Endoscopy;  Laterality: N/A;    CORONARY STENT INTERVENTION N/A 09/09/2017   Procedure: CORONARY STENT INTERVENTION;  Surgeon: Iran Ouch, MD;  Location: [facility] INVASIVE CV LAB;  Service: Cardiovascular;  Laterality: N/A;   CORONARY ULTRASOUND/IVUS N/A 09/09/2017   Procedure: Intravascular Ultrasound/IVUS;  Surgeon: Iran Ouch, MD;  Location: [facility] INVASIVE CV LAB;  Service: Cardiovascular;  Laterality: N/A;   CORONARY/GRAFT ACUTE MI REVASCULARIZATION N/A 07/18/2017   Procedure: Coronary/Graft Acute MI Revascularization;  Surgeon: Iran Ouch, MD;  Location: [facility] INVASIVE CV LAB;  Service: Cardiovascular;  Laterality: N/A;   HEMORRHOID SURGERY N/A 12/21/2014   Procedure: HEMORRHOIDECTOMY;  Surgeon: Kieth Brightly, MD;  Location: [facility] ORS;  Service: General;  Laterality: N/A;   KNEE SURGERY Right age 50   LEFT HEART CATH AND CORONARY ANGIOGRAPHY N/A 07/18/2017   Procedure: LEFT HEART CATH AND CORONARY ANGIOGRAPHY;  Surgeon: Iran Ouch, MD;  Location: [facility] INVASIVE CV LAB;  Service: Cardiovascular;  Laterality: N/A;   LEFT HEART CATH  AND CORONARY ANGIOGRAPHY Left 08/19/2017   Procedure: LEFT HEART CATH AND CORONARY ANGIOGRAPHY;  Surgeon: Iran Ouch, MD;  Location: [facility] INVASIVE CV LAB;  Service: Cardiovascular;  Laterality: Left;   LEFT HEART CATH AND CORONARY ANGIOGRAPHY N/A 11/07/2017   Procedure: LEFT HEART CATH AND CORONARY ANGIOGRAPHY;  Surgeon: Iran Ouch, MD;  Location: [facility] INVASIVE CV LAB;  Service: Cardiovascular;  Laterality: N/A;   LEFT HEART CATH AND CORONARY ANGIOGRAPHY N/A 10/17/2021   Procedure: LEFT HEART CATH AND CORONARY ANGIOGRAPHY;  Surgeon: Iran Ouch, MD;  Location: [facility] INVASIVE CV LAB;  Service: Cardiovascular;  Laterality: N/A;   ROTATOR CUFF REPAIR Right 2013   Family History  Problem Relation Age of Onset   Hypertension Mother    Alzheimer's disease Mother    Hypertension Father    Alzheimer's disease Father    Breast cancer Neg Hx    Social History    Tobacco Use   Smoking status: Former    Current packs/day: 0.00    Types: Cigarettes    Quit date: 07/24/2007    Years since quitting: 15.9    Passive exposure: Past   Smokeless tobacco: Former    Types: Chew   Tobacco comments:    smoking cessation materials not required  Vaping Use   Vaping status: Never Used  Substance Use Topics   Alcohol use: Yes    Alcohol/week: 5.0 - 6.0 standard drinks of alcohol    Types: 5 - 6 Cans of beer per week    Comment:  weekly   Drug use: No    Pertinent Clinical Results:  LABS:   No visits with results within 3 Day(s) from this visit.  Latest known visit with results is:  Office Visit on 04/24/2023  Component Date Value Ref Range Status   WBC 04/24/2023 5.8  3.4 - 10.8 x10E3/uL Final   RBC 04/24/2023 4.94  4.14 - 5.80 x10E6/uL Final   Hemoglobin 04/24/2023 14.1  13.0 - 17.7 g/dL Final   Hematocrit 98/05/9146 44.2  37.5 - 51.0 % Final   MCV 04/24/2023 90  79 - 97 fL Final   MCH 04/24/2023 28.5  26.6 - 33.0 pg Final   MCHC 04/24/2023 31.9  31.5 - 35.7 g/dL Final   RDW 82/95/6213 14.3  11.6 - 15.4 % Final   Platelets 04/24/2023 251  150 - 450 x10E3/uL Final   Neutrophils 04/24/2023 32  Not Estab. % Final   Lymphs 04/24/2023 45  Not Estab. % Final   Monocytes 04/24/2023 12  Not Estab. % Final   Eos 04/24/2023 10  Not Estab. % Final   Basos 04/24/2023 1  Not Estab. % Final   Neutrophils Absolute 04/24/2023 1.8  1.4 - 7.0 x10E3/uL Final   Lymphocytes Absolute 04/24/2023 2.6  0.7 - 3.1 x10E3/uL Final   Monocytes Absolute 04/24/2023 0.7  0.1 - 0.9 x10E3/uL Final   EOS (ABSOLUTE) 04/24/2023 0.6 (H)  0.0 - 0.4 x10E3/uL Final   Basophils Absolute 04/24/2023 0.1  0.0 - 0.2 x10E3/uL Final   Immature Granulocytes 04/24/2023 0  Not Estab. % Final   Immature Grans (Abs) 04/24/2023 0.0  0.0 - 0.1 x10E3/uL Final   Glucose 04/24/2023 106 (H)  70 - 99 mg/dL Final   BUN 08/65/7846 12  8 - 27 mg/dL Final   Creatinine, Ser 04/24/2023 1.22  0.76 - 1.27  mg/dL Final   eGFR 96/29/5284 64  >59 mL/min/1.73 Final   BUN/Creatinine Ratio 04/24/2023 10  10 - 24 Final  Sodium 04/24/2023 140  134 - 144 mmol/L Final   Potassium 04/24/2023 4.4  3.5 - 5.2 mmol/L Final   Chloride 04/24/2023 103  96 - 106 mmol/L Final   CO2 04/24/2023 22  20 - 29 mmol/L Final   Calcium 04/24/2023 9.6  8.6 - 10.2 mg/dL Final   Total Protein 91/47/8295 7.2  6.0 - 8.5 g/dL Final   Albumin 62/13/0865 4.6  3.9 - 4.9 g/dL Final   Globulin, Total 04/24/2023 2.6  1.5 - 4.5 g/dL Final   Bilirubin Total 04/24/2023 1.3 (H)  0.0 - 1.2 mg/dL Final   Alkaline Phosphatase 04/24/2023 50  44 - 121 IU/L Final   AST 04/24/2023 37  0 - 40 IU/L Final   ALT 04/24/2023 41  0 - 44 IU/L Final   Cholesterol, Total 04/24/2023 168  100 - 199 mg/dL Final   Triglycerides 78/46/9629 81  0 - 149 mg/dL Final   HDL 52/84/1324 62  >39 mg/dL Final   VLDL Cholesterol Cal 04/24/2023 15  5 - 40 mg/dL Final   LDL Chol Calc (NIH) 04/24/2023 91  0 - 99 mg/dL Final   Chol/HDL Ratio 04/24/2023 2.7  0.0 - 5.0 ratio Final   Comment:                                   T. Chol/HDL Ratio                                             Men  Women                               1/2 Avg.Risk  3.4    3.3                                   Avg.Risk  5.0    4.4                                2X Avg.Risk  9.6    7.1                                3X Avg.Risk 23.4   11.0    Prostate Specific Ag, Serum 04/24/2023 1.8  0.0 - 4.0 ng/mL Final   Comment: Roche ECLIA methodology. According to the American Urological Association, Serum PSA should decrease and remain at undetectable levels after radical prostatectomy. The AUA defines biochemical recurrence as an initial PSA value 0.2 ng/mL or greater followed by a subsequent confirmatory PSA value 0.2 ng/mL or greater. Values obtained with different assay methods or kits cannot be used interchangeably. Results cannot be interpreted as absolute evidence of the presence or absence  of malignant disease.    Hgb A1c MFr Bld 04/24/2023 6.1 (H)  4.8 - 5.6 % Final   Comment:          Prediabetes: 5.7 - 6.4          Diabetes: >6.4          Glycemic control for adults with diabetes: <7.0  Est. average glucose Bld gHb Est-m* 04/24/2023 128  mg/dL Final    ECG: Date: 91/47/8295 Time ECG obtained: 1120 AM Rate: 60 bpm Rhythm:  sinus rhythm with occasional PVCs Axis (leads I and aVF): Normal Intervals: PR 152 ms. QRS 94 ms. QTc 412 ms. ST segment and T wave changes: No evidence of acute ST segment elevation or depression.   Comparison: Similar to previous tracing obtained on 07/12/2022   IMAGING / PROCEDURES: MR CERVICAL SPINE WO CONTRAST performed on 03/05/2023 No change in the appearance of the cervical spine since the prior MRI. Status post C4-6 discectomy and fusion. The central canal is open at both levels. Uncovertebral spurring causes marked bilateral foraminal narrowing at C5-6. Mild bilateral foraminal narrowing C3-4 and C7-T1. Mild to moderate foraminal narrowing at C6-7 is worse on the right.  TRANSTHORACIC ECHOCARDIOGRAM performed on 04/18/2022 Left ventricular ejection fraction, by estimation, is 45 to 50%. The left ventricle has mildly decreased function. The left ventricle demonstrates global hypokinesis. Left ventricular diastolic parameters are  consistent with Grade II diastolic  dysfunction (pseudonormalization).  Right ventricular systolic function is normal.  The mitral valve is normal in structure. No evidence of mitral valve regurgitation.    LONG TERM CARDIAC EVENT MONITOR STUDY performed on  01/05/2022 Patch Wear Time:  13 days and 23 hours   Patient had a min HR of 42 bpm, max HR of 136 bpm, and avg HR of 66 bpm.  Predominant underlying rhythm was Sinus Rhythm.   1 run of Supraventricular Tachycardia occurred lasting 6 beats with a max rate of 135 bpm (avg 105 bpm). Rare PACs and rare PVCs with a burden of less than 1%. No significant  arrhythmia overall.   LEFT HEART CATHETERIZATION AND CORONARY ANGIOGRAPHY performed on 10/17/2021 Normal left ventricular systolic function with an EF of 45-50% Mildly elevated LVEDP = 23 mmHg Widely patent RCA and LAD stents with no significant ISR Multivessel CAD 70% ostial ramus intermedius 20% mid LAD 30% proximal LCx Recommendations No culprit lesion identified from patient's symptoms Continue aggressive medical therapy for coronary artery disease      MYOCARDIAL PERFUSION IMAGING STUDY (LEXISCAN) performed on 01/18/2021 Mildly decreased left ventricular systolic function with a EF of 47% T wave inversions noted during stress and leads II, III, aVF, V5-V6 No ST segment deviation noted during stress No evidence for significant ischemia Normal low risk study  Impression and Plan:  RINO TWILLEY has been referred for pre-anesthesia review and clearance prior to him undergoing the planned anesthetic and procedural courses. Available labs, pertinent testing, and imaging results were personally reviewed by me in preparation for upcoming operative/procedural course. [facility] medical record has been updated following extensive record review and patient interview with PAT staff.   This patient has been appropriately cleared by cardiology with an overall ACCEPTABLE risk of experiencing significant perioperative cardiovascular complications. Based on clinical review performed today (07/02/23), barring any significant acute changes in the patient's overall condition, it is anticipated that he will be able to proceed with the planned surgical intervention. Any acute changes in clinical condition may necessitate his procedure being postponed and/or cancelled. Patient will meet with anesthesia team (MD and/or CRNA) on the day of his procedure for preoperative evaluation/assessment. Questions regarding anesthetic course will be fielded at that time.   Pre-surgical instructions were reviewed  with the patient during his PAT appointment, and questions were fielded to satisfaction by PAT clinical staff. He has been instructed on which medications that he will  need to hold prior to surgery, as well as the ones that have been deemed safe/appropriate to take on the day of his procedure. As part of the general education provided by PAT, patient made aware both verbally and in writing, that he would need to abstain from the use of any illegal substances during his perioperative course.  He was advised that failure to follow the provided instructions could necessitate case cancellation or result in serious perioperative complications up to and including death. Patient encouraged to contact PAT and/or his surgeon's office to discuss any questions or concerns that may arise prior to surgery; verbalized understanding.   Quentin Mulling, MSN, APRN, FNP-C, CEN [facility]  Perioperative Services Nurse Practitioner Phone: [phone] Fax: [phone] 07/02/23 3:52 PM  NOTE: This note has been prepared using Dragon dictation software. Despite my best ability to proofread, there is always the potential that unintentional transcriptional errors may still occur from this process.

## 2023-07-04 ENCOUNTER — Ambulatory Visit
Admission: RE | Admit: 2023-07-04 | Discharge: 2023-07-04 | Disposition: A | Discharge status: Home / Self Care | Payer: Medicare HMO | Attending: Surgery | Admitting: Surgery

## 2023-07-04 ENCOUNTER — Other Ambulatory Visit: Payer: Self-pay

## 2023-07-04 ENCOUNTER — Ambulatory Visit: Payer: Self-pay | Admitting: Urgent Care

## 2023-07-04 ENCOUNTER — Encounter: Payer: Self-pay | Admitting: Surgery

## 2023-07-04 ENCOUNTER — Encounter
Admission: RE | Disposition: A | Discharge status: Home / Self Care | Payer: Self-pay | Source: Home / Self Care | Attending: Surgery

## 2023-07-04 DIAGNOSIS — K219 Gastro-esophageal reflux disease without esophagitis: Secondary | ICD-10-CM | POA: Insufficient documentation

## 2023-07-04 DIAGNOSIS — G5602 Carpal tunnel syndrome, left upper limb: Secondary | ICD-10-CM | POA: Insufficient documentation

## 2023-07-04 DIAGNOSIS — I2510 Atherosclerotic heart disease of native coronary artery without angina pectoris: Secondary | ICD-10-CM | POA: Diagnosis not present

## 2023-07-04 DIAGNOSIS — Z87891 Personal history of nicotine dependence: Secondary | ICD-10-CM | POA: Insufficient documentation

## 2023-07-04 DIAGNOSIS — I5022 Chronic systolic (congestive) heart failure: Secondary | ICD-10-CM | POA: Insufficient documentation

## 2023-07-04 DIAGNOSIS — I739 Peripheral vascular disease, unspecified: Secondary | ICD-10-CM | POA: Insufficient documentation

## 2023-07-04 DIAGNOSIS — E782 Mixed hyperlipidemia: Secondary | ICD-10-CM

## 2023-07-04 DIAGNOSIS — I110 Hypertensive heart disease with heart failure: Secondary | ICD-10-CM | POA: Diagnosis not present

## 2023-07-04 DIAGNOSIS — B354 Tinea corporis: Secondary | ICD-10-CM

## 2023-07-04 HISTORY — DX: Hypertrophy of breast: N62

## 2023-07-04 HISTORY — DX: Long term (current) use of aspirin: Z79.82

## 2023-07-04 HISTORY — DX: Carpal tunnel syndrome, bilateral upper limbs: G56.03

## 2023-07-04 HISTORY — DX: Long term (current) use of antithrombotics/antiplatelets: Z79.02

## 2023-07-04 HISTORY — DX: Bradycardia, unspecified: R00.1

## 2023-07-04 HISTORY — PX: CARPAL TUNNEL RELEASE: SHX101

## 2023-07-04 SURGERY — RELEASE, CARPAL TUNNEL, ENDOSCOPIC
Anesthesia: General | Site: Wrist | Laterality: Left

## 2023-07-04 MED ORDER — METOCLOPRAMIDE HCL 10 MG PO TABS: 5.0000 mg | ORAL_TABLET | Freq: Three times a day (TID) | ORAL | Status: DC | PRN

## 2023-07-04 MED ORDER — FENTANYL CITRATE (PF) 100 MCG/2ML IJ SOLN
INTRAMUSCULAR | Status: DC | PRN
  Administered 2023-07-04 (×2): 25 ug via INTRAVENOUS
  Administered 2023-07-04: 50 ug via INTRAVENOUS

## 2023-07-04 MED ORDER — HYDROCODONE-ACETAMINOPHEN 5-325 MG PO TABS
1.0000 | ORAL_TABLET | ORAL | Status: DC | PRN
Start: 2023-07-04 — End: 2023-07-04

## 2023-07-04 MED ORDER — GABAPENTIN 300 MG PO CAPS: 300.0000 mg | ORAL_CAPSULE | ORAL | Status: DC

## 2023-07-04 MED ORDER — OXYCODONE HCL 5 MG PO TABS: 5.0000 mg | ORAL_TABLET | Freq: Once | ORAL | Status: DC | PRN

## 2023-07-04 MED ORDER — EPHEDRINE SULFATE-NACL 50-0.9 MG/10ML-% IV SOSY
PREFILLED_SYRINGE | INTRAVENOUS | Status: DC | PRN
  Administered 2023-07-04: 5 mg via INTRAVENOUS

## 2023-07-04 MED ORDER — CHLORHEXIDINE GLUCONATE 0.12 % MT SOLN
15.0000 mL | Freq: Once | OROMUCOSAL | Status: AC
  Administered 2023-07-04: 15 mL via OROMUCOSAL

## 2023-07-04 MED ORDER — DEXAMETHASONE SODIUM PHOSPHATE 10 MG/ML IJ SOLN
INTRAMUSCULAR | Status: AC
  Filled 2023-07-04: qty 1

## 2023-07-04 MED ORDER — BUPIVACAINE HCL (PF) 0.5 % IJ SOLN
INTRAMUSCULAR | Status: DC | PRN
  Administered 2023-07-04: 10 mL

## 2023-07-04 MED ORDER — ONDANSETRON HCL 4 MG PO TABS: 4.0000 mg | ORAL_TABLET | Freq: Four times a day (QID) | ORAL | Status: DC | PRN

## 2023-07-04 MED ORDER — CEFAZOLIN SODIUM-DEXTROSE 2-4 GM/100ML-% IV SOLN
INTRAVENOUS | Status: AC
  Filled 2023-07-04: qty 100

## 2023-07-04 MED ORDER — 0.9 % SODIUM CHLORIDE (POUR BTL) OPTIME
TOPICAL | Status: DC | PRN
  Administered 2023-07-04: 500 mL

## 2023-07-04 MED ORDER — EZETIMIBE 10 MG PO TABS: 10.0000 mg | ORAL_TABLET | ORAL | Status: DC

## 2023-07-04 MED ORDER — LACTATED RINGERS IV SOLN: INTRAVENOUS | Status: DC | PRN

## 2023-07-04 MED ORDER — ACETAMINOPHEN 325 MG PO TABS: 325.0000 mg | ORAL_TABLET | Freq: Four times a day (QID) | ORAL | Status: DC | PRN

## 2023-07-04 MED ORDER — DEXTROSE-SODIUM CHLORIDE 5-0.9 % IV SOLN: INTRAVENOUS | Status: DC

## 2023-07-04 MED ORDER — PHENYLEPHRINE 80 MCG/ML (10ML) SYRINGE FOR IV PUSH (FOR BLOOD PRESSURE SUPPORT)
PREFILLED_SYRINGE | INTRAVENOUS | Status: DC | PRN
  Administered 2023-07-04: 80 ug via INTRAVENOUS

## 2023-07-04 MED ORDER — LACTATED RINGERS IV SOLN: INTRAVENOUS | Status: DC

## 2023-07-04 MED ORDER — CLOTRIMAZOLE-BETAMETHASONE 1-0.05 % EX CREA: 1.0000 | TOPICAL_CREAM | Freq: Every day | CUTANEOUS | Status: DC | PRN

## 2023-07-04 MED ORDER — KETOROLAC TROMETHAMINE 15 MG/ML IJ SOLN
15.0000 mg | Freq: Once | INTRAMUSCULAR | Status: AC
Start: 2023-07-04 — End: 2023-07-04
  Administered 2023-07-04: 15 mg via INTRAVENOUS

## 2023-07-04 MED ORDER — LIDOCAINE HCL (CARDIAC) PF 100 MG/5ML IV SOSY
PREFILLED_SYRINGE | INTRAVENOUS | Status: DC | PRN
  Administered 2023-07-04: 60 mg via INTRAVENOUS

## 2023-07-04 MED ORDER — METOCLOPRAMIDE HCL 5 MG/ML IJ SOLN: 5.0000 mg | Freq: Three times a day (TID) | INTRAMUSCULAR | Status: DC | PRN

## 2023-07-04 MED ORDER — ONDANSETRON HCL 4 MG/2ML IJ SOLN
INTRAMUSCULAR | Status: DC | PRN
  Administered 2023-07-04: 4 mg via INTRAVENOUS

## 2023-07-04 MED ORDER — ROSUVASTATIN CALCIUM 40 MG PO TABS: 40.0000 mg | ORAL_TABLET | Freq: Every evening | ORAL | Status: DC

## 2023-07-04 MED ORDER — ORAL CARE MOUTH RINSE: 15.0000 mL | Freq: Once | OROMUCOSAL | Status: AC

## 2023-07-04 MED ORDER — TAMSULOSIN HCL 0.4 MG PO CAPS: 0.4000 mg | ORAL_CAPSULE | Freq: Every day | ORAL | Status: DC

## 2023-07-04 MED ORDER — DEXAMETHASONE SODIUM PHOSPHATE 10 MG/ML IJ SOLN
INTRAMUSCULAR | Status: DC | PRN
  Administered 2023-07-04: 5 mg via INTRAVENOUS

## 2023-07-04 MED ORDER — PROPOFOL 10 MG/ML IV BOLUS
INTRAVENOUS | Status: DC | PRN
  Administered 2023-07-04: 150 mg via INTRAVENOUS

## 2023-07-04 MED ORDER — BUPIVACAINE HCL (PF) 0.5 % IJ SOLN
INTRAMUSCULAR | Status: AC
  Filled 2023-07-04: qty 30

## 2023-07-04 MED ORDER — FENTANYL CITRATE (PF) 100 MCG/2ML IJ SOLN
INTRAMUSCULAR | Status: AC
  Filled 2023-07-04: qty 2

## 2023-07-04 MED ORDER — CEFAZOLIN SODIUM-DEXTROSE 2-4 GM/100ML-% IV SOLN
2.0000 g | INTRAVENOUS | Status: AC
  Administered 2023-07-04: 2 g via INTRAVENOUS

## 2023-07-04 MED ORDER — PROPOFOL 10 MG/ML IV BOLUS
INTRAVENOUS | Status: AC
  Filled 2023-07-04: qty 20

## 2023-07-04 MED ORDER — ONDANSETRON HCL 4 MG/2ML IJ SOLN: 4.0000 mg | Freq: Four times a day (QID) | INTRAMUSCULAR | Status: DC | PRN

## 2023-07-04 MED ORDER — CHLORHEXIDINE GLUCONATE 0.12 % MT SOLN
OROMUCOSAL | Status: AC
  Filled 2023-07-04: qty 15

## 2023-07-04 MED ORDER — HYDROCODONE-ACETAMINOPHEN 5-325 MG PO TABS: 1.0000 | ORAL_TABLET | Freq: Four times a day (QID) | ORAL | 0 refills | Status: DC | PRN

## 2023-07-04 MED ORDER — ONDANSETRON HCL 4 MG/2ML IJ SOLN
INTRAMUSCULAR | Status: AC
  Filled 2023-07-04: qty 2

## 2023-07-04 MED ORDER — KETOROLAC TROMETHAMINE 15 MG/ML IJ SOLN
INTRAMUSCULAR | Status: AC
  Filled 2023-07-04: qty 1

## 2023-07-04 MED ORDER — FENTANYL CITRATE (PF) 100 MCG/2ML IJ SOLN
25.0000 ug | INTRAMUSCULAR | Status: DC | PRN
Start: 2023-07-04 — End: 2023-07-04

## 2023-07-04 MED ORDER — OXYCODONE HCL 5 MG/5ML PO SOLN: 5.0000 mg | Freq: Once | ORAL | Status: DC | PRN

## 2023-07-04 MED ORDER — OMEPRAZOLE 40 MG PO CPDR: 40.0000 mg | DELAYED_RELEASE_CAPSULE | Freq: Every day | ORAL | Status: DC | PRN

## 2023-07-04 SURGICAL SUPPLY — 28 items
BNDG COHESIVE 4X5 TAN STRL LF (GAUZE/BANDAGES/DRESSINGS) ×1 IMPLANT
BNDG ELASTIC 2INX 5YD STR LF (GAUZE/BANDAGES/DRESSINGS) ×1 IMPLANT
BNDG ESMARCH 4X12 STRL LF (GAUZE/BANDAGES/DRESSINGS) ×1 IMPLANT
CHLORAPREP W/TINT 26 (MISCELLANEOUS) ×1 IMPLANT
CORD BIP STRL DISP 12FT (MISCELLANEOUS) ×1 IMPLANT
CUFF TOURN SGL QUICK 18X4 (TOURNIQUET CUFF) ×1 IMPLANT
DRAPE SURG 17X11 SM STRL (DRAPES) ×1 IMPLANT
FORCEPS JEWEL BIP 4-3/4 STR (INSTRUMENTS) ×1 IMPLANT
GAUZE SPONGE 4X4 12PLY STRL (GAUZE/BANDAGES/DRESSINGS) ×1 IMPLANT
GAUZE XEROFORM 1X8 LF (GAUZE/BANDAGES/DRESSINGS) ×1 IMPLANT
GLOVE BIO SURGEON STRL SZ8 (GLOVE) ×1 IMPLANT
GLOVE INDICATOR 8.0 STRL GRN (GLOVE) ×1 IMPLANT
GOWN STRL REUS W/ TWL LRG LVL3 (GOWN DISPOSABLE) ×1 IMPLANT
GOWN STRL REUS W/ TWL XL LVL3 (GOWN DISPOSABLE) ×1 IMPLANT
KIT ESCP INSRT D SLOT CANN KN (MISCELLANEOUS) ×1 IMPLANT
KIT TURNOVER KIT A (KITS) ×1 IMPLANT
MANIFOLD NEPTUNE II (INSTRUMENTS) ×1 IMPLANT
NS IRRIG 500ML POUR BTL (IV SOLUTION) ×1 IMPLANT
PACK EXTREMITY ARMC (MISCELLANEOUS) ×1 IMPLANT
SPLINT WRIST LG LT TX990309 (SOFTGOODS) IMPLANT
SPLINT WRIST LG RT TX900304 (SOFTGOODS) IMPLANT
SPLINT WRIST M LT TX990308 (SOFTGOODS) IMPLANT
SPLINT WRIST M RT TX990303 (SOFTGOODS) IMPLANT
SPLINT WRIST XL LT TX990310 (SOFTGOODS) IMPLANT
SPLINT WRIST XL RT TX990305 (SOFTGOODS) IMPLANT
STOCKINETTE IMPERVIOUS 9X36 MD (GAUZE/BANDAGES/DRESSINGS) ×1 IMPLANT
SUT PROLENE 4 0 PS 2 18 (SUTURE) ×1 IMPLANT
TRAP FLUID SMOKE EVACUATOR (MISCELLANEOUS) ×1 IMPLANT

## 2023-07-04 NOTE — Anesthesia Postprocedure Evaluation (Signed)
Anesthesia Post Note  Patient: Todd Weaver  Procedure(s) Performed: CARPAL TUNNEL RELEASE ENDOSCOPIC (Left: Wrist)  Patient location during evaluation: PACU Anesthesia Type: General Level of consciousness: awake and alert Pain management: pain level controlled Vital Signs Assessment: post-procedure vital signs reviewed and stable Respiratory status: spontaneous breathing, nonlabored ventilation, respiratory function stable and patient connected to nasal cannula oxygen Cardiovascular status: blood pressure returned to baseline and stable Postop Assessment: no apparent nausea or vomiting Anesthetic complications: no  No notable events documented.   Last Vitals:  Vitals:   07/04/23 1430 07/04/23 1445  BP: 130/76 130/77  Pulse: (!) 51 (!) 54  Resp: 14 11  Temp:    SpO2: 100% 97%    Last Pain:  Vitals:   07/04/23 1445  TempSrc:   PainSc: 0-No pain                 Stephanie Coup

## 2023-07-04 NOTE — Anesthesia Procedure Notes (Signed)
Procedure Name: LMA Insertion Date/Time: 07/04/2023 1:46 PM  Performed by: Monico Hoar, CRNAPre-anesthesia Checklist: Patient identified, Patient being monitored, Timeout performed, Emergency Drugs available and Suction available Patient Re-evaluated:Patient Re-evaluated prior to induction Oxygen Delivery Method: Circle system utilized Preoxygenation: Pre-oxygenation with 100% oxygen Induction Type: IV induction Ventilation: Mask ventilation without difficulty LMA: LMA inserted LMA Size: 4.0 Tube type: Oral Number of attempts: 1 Placement Confirmation: positive ETCO2 and breath sounds checked- equal and bilateral Tube secured with: Tape Dental Injury: Teeth and Oropharynx as per pre-operative assessment

## 2023-07-04 NOTE — Discharge Instructions (Addendum)
Orthopedic discharge instructions: Keep dressing dry and intact. Keep hand elevated above heart level. May shower after dressing removed on postop day 4 (Monday). Cover sutures with Band-Aids after drying off, then reapply Velcro splint. Apply ice to affected area frequently. Take ibuprofen 600-800 mg TID with meals for 3-5 days, then as necessary. Take ES Tylenol or pain medication as prescribed when needed.  May resume Plavix tomorrow. Return for follow-up in 10-14 days or as scheduled.

## 2023-07-04 NOTE — H&P (Signed)
History of Present Illness:  Todd Weaver is a 72 y.o. male who presents for evaluation and treatment of his bilateral hand and wrist pain and paresthesias, left more symptomatic than right. The patient notes that his symptoms have been present for over a year and developed without any specific cause or injury. His symptoms are aggravated by any repetitive or grasping type activities. He also has pain at night, frequently awakening him from sleep. He has undergone injections into both carpal tunnels which provided relief but only lasted a few days before his symptoms recurred. He rates his pain at 8/10, and has been taking gabapentin, Percocet, and other over-the-counter medications as necessary, also with limited benefit. He underwent an EMG of both upper extremities which confirmed the presence of severe carpal tunnel on the left and moderate carpal tunnel on the right. He saw Dr. Katrinka Blazing who wanted to do an ultrasound-guided carpal tunnel release but apparently his insurance company does not cover this procedure. Therefore, the patient has been referred to me to discuss an endoscopic carpal tunnel release procedure.  Current Outpatient Medications:  amLODIPine (NORVASC) 5 MG tablet Take by mouth  aspirin 81 MG chewable tablet Take 81 mg by mouth once daily  atorvastatin (LIPITOR) 80 MG tablet  carvediloL (COREG) 3.125 MG tablet  clopidogreL (PLAVIX) 75 mg tablet  ezetimibe (ZETIA) 10 mg tablet Take 10 mg by mouth once daily  FUROsemide (LASIX) 20 MG tablet furosemide 20 mg tablet  gabapentin (NEURONTIN) 300 MG capsule Increase gabapentin to 300 mg twice daily, then increase to 300 mg in the morning and 600 mg at night. 90 capsule 2  losartan (COZAAR) 25 MG tablet  methocarbamoL (ROBAXIN) 500 MG tablet  metoprolol succinate (TOPROL-XL) 25 MG XL tablet metoprolol succinate ER 25 mg tablet,extended release 24 hr  nitroGLYcerin (NITROSTAT) 0.4 MG SL tablet nitroglycerin 0.4 mg sublingual tablet   omeprazole (PRILOSEC) 40 MG DR capsule Take 40 mg by mouth once daily  oxyBUTYnin (DITROPAN-XL) 10 MG XL tablet Take 10 mg by mouth at bedtime  oxyCODONE-acetaminophen (PERCOCET) 5-325 mg tablet TAKE 1 TABLET BY MOUTH EVERY 6 HOURS FOR UP TO 7 DAYS AS NEEDED FOR SEVERE PAIN  potassium chloride (KLOR-CON) 10 MEQ ER tablet  predniSONE (DELTASONE) 10 MG tablet 5,5,4,4,3,3,2,2,1,1 tab po tapering dose 30 tablet 0  rosuvastatin (CRESTOR) 40 MG tablet  sacubitriL-valsartan (ENTRESTO) 49-51 mg tablet Take 1 tablet by mouth 2 (two) times daily  SENNA 8.6 mg tablet  spironolactone (ALDACTONE) 25 MG tablet  tamsulosin (FLOMAX) 0.4 mg capsule Take 0.4 mg by mouth once daily Take 30 minutes after same meal each day.  tiZANidine (ZANAFLEX) 2 MG tablet Take 1 tablet (2 mg total) by mouth at bedtime as needed 30 tablet 1  traMADoL (ULTRAM) 50 mg tablet Take 1 tablet (50 mg total) by mouth every 6 (six) hours as needed for Pain for up to 30 doses 30 tablet 0   Allergies:  Codeine Hives and Other (See Comments)  Shellfish Containing Products Hives and Swelling  Fish Containing Products Itching  Iodinated Contrast Media Itching and Welts on tongue   Past Medical History:  BPH (benign prostatic hyperplasia) 06/19/2017  Carpal tunnel syndrome, bilateral 12/26/2020  Chronic deep vein thrombosis (DVT) of brachial vein of left upper extremity (CMS/HHS-HCC) 12/28/2021  Chronic systolic (congestive) heart failure (CMS/HHS-HCC) 09/29/2020  Contrast media allergy 12/12/2021  Coronary artery disease of native artery of native heart with stable angina pectoris (CMS-HCC) 10/03/2018  ED (erectile dysfunction) 06/19/2017  Essential hypertension 06/19/2017  GERD (gastroesophageal reflux disease) 12/12/2021  Hand dermatitis, unspecified 07/26/2014  Hemorrhoid 12/12/2021  Hx of transient ischemic attack (TIA) 07/23/2009  Impingement syndrome of left shoulder region 10/14/2018  Ischemic cardiomyopathy 12/12/2021   Lumbar spondylosis with myelopathy 11/08/2016  MCI (mild cognitive impairment) 06/19/2017  Mixed hyperlipidemia 06/19/2017  Neck muscle spasm 12/26/2020  PAD (peripheral artery disease) (CMS-HCC) 03/31/2019  PAF (paroxysmal atrial fibrillation) (CMS/HHS-HCC) 04/11/2020  Primary insomnia 06/18/2022  Rectal polyp  Thrombophilia (CMS/HHS-HCC) 09/29/2020  Urinary hesitancy 12/26/2020   Past Surgical History:  APPENDECTOMY 07/23/1973  ARTHROSCOPIC ROTATOR CUFF REPAIR Right 07/24/2011  FISTULECTOMY ANAL 12/21/2014  HEMORRHOID SURGERY 12/21/2014  CORONARY/GRAFT ACUTE MI REVASCULARIZATION 07/18/2017  LEFT HEART CATH AND CORONARY ANGIOGRAPHY 07/18/2017  LEFT HEART CATH AND CORONARY ANGIOGRAPHY Left 08/19/2017  CORONARY STENT INTERVENTION 09/09/2017  INTRAVASCULAR ULTRASOUND/IVUS 09/09/2017  LEFT HEART CATH AND CORONARY ANGIOGRAPHY 11/07/2017  COLONOSCOPY WITH PROPOFOL 02/12/2019  LEFT HEART CATH AND CORONARY ANGIOGRAPHY 10/17/2021  CARDIAC CATHETERIZATION  KNEE SURGERY Right (72 YEARS OLD)   Past Family History: No family history on file.   Social History:   Socioeconomic History:  Marital status: Married  Tobacco Use  Smoking status: Former  Types: Cigarettes  Smokeless tobacco: Never  Substance and Sexual Activity  Alcohol use: Yes  Comment: 2-3 beers a day  Drug use: Never   Social Drivers of Health:   Physicist, medical Strain: Low Risk (12/27/2022)  Received from [facility]  Overall Financial Resource Strain (CARDIA)  Difficulty of Paying Living Expenses: Not hard at all  Food Insecurity: No Food Insecurity (12/27/2022)  Received from [facility]  Hunger Vital Sign  Worried About Running Out of Food in the Last Year: Never true  Ran Out of Food in the Last Year: Never true  Transportation Needs: No Transportation Needs (12/27/2022)  Received from [facility] - Transportation  Lack of Transportation (Medical): No  Lack of Transportation (Non-Medical): No    Review of Systems:  A comprehensive 14 point ROS was performed, reviewed, and the pertinent orthopaedic findings are documented in the HPI.  Physical Exam: Vitals:  06/10/23 1332  BP: 134/84  Height: 170.2 cm (5\' 7" )  PainSc: 8  PainLoc: Hand   General/Constitutional: The patient appears to be well-nourished, well-developed, and in no acute distress. Neuro/Psych: Normal mood and affect, oriented to person, place and time. Eyes: Non-icteric. Pupils are equal, round, and reactive to light, and exhibit synchronous movement. ENT: Unremarkable. Lymphatic: No palpable adenopathy. Respiratory: Lungs clear to auscultation, Normal chest excursion, No wheezes, and Non-labored breathing Cardiovascular: Regular rate and rhythm. No murmurs. and No edema, swelling or tenderness, except as noted in detailed exam. Integumentary: No impressive skin lesions present, except as noted in detailed exam. Musculoskeletal: Unremarkable, except as noted in detailed exam.  Left wrist/hand exam: Skin inspection of the left wrist and hand is unremarkable. No swelling, erythema, ecchymosis, abrasions, or other skin abnormalities are identified. He has no tenderness to palpation of the dorsal or volar aspects of the wrist, nor does he have any tenderness palpation of the dorsal or palmar aspects of the hand. He is able to flex and extend his wrist fully without any pain or catching. He is able to actively flex and extend all digits fully without any pain or triggering. He is able to oppose his thumb to the base of the little finger. He is neurovascularly intact to all digits, other than subjectively decreased sensation to light touch to the thumb, index, and long fingers. He has a positive  Phalen's test and an equivocally positive Tinel's on the left.  EMG results:  The results of a recent EMG are available for review and have been reviewed by myself. The findings are as described above.  Assessment: Carpal tunnel  syndrome, Left.   Plan: The treatment options were discussed with the patient and his wife. In addition, patient educational materials were provided regarding the diagnosis and treatment options. The patient is quite frustrated by his symptoms and functional limitations, and is ready to consider more aggressive treatment options. Therefore, I have recommended a surgical procedure, specifically an endoscopic left carpal tunnel release. The procedure was discussed with the patient, as were the potential risks (including bleeding, infection, nerve and/or blood vessel injury, persistent or recurrent pain/paresthesias, weakness of grip, need for further surgery, blood clots, strokes, heart attacks and/or arhythmias, pneumonia, etc.) and benefits. The patient states his understanding and wishes to proceed. All of the patient's questions and concerns were answered. He can call any time with further concerns. He will follow up post-surgery, routine.    H&P reviewed and patient re-examined. No changes.

## 2023-07-04 NOTE — Anesthesia Preprocedure Evaluation (Signed)
Anesthesia Evaluation  Patient identified by MRN, date of birth, ID band Patient awake    Reviewed: Allergy & Precautions, NPO status , Patient's Chart, lab work & pertinent test results  History of Anesthesia Complications Negative for: history of anesthetic complications  Airway Mallampati: II  TM Distance: >3 FB Neck ROM: full    Dental  (+) Dental Advidsory Given, Edentulous Lower, Edentulous Upper   Pulmonary neg pulmonary ROS, former smoker   Pulmonary exam normal        Cardiovascular Exercise Tolerance: Good hypertension, Pt. on medications and Pt. on home beta blockers (-) angina + CAD, + Past MI, + Peripheral Vascular Disease and +CHF  (-) DOE Normal cardiovascular exam Rhythm:Regular Rate:Normal  Most recent TTE was performed on 04/18/2022 revealing a mildly reduced left ventricular systolic function with an EF of 45-50%.  There was global hypokinesis. Left ventricular diastolic Doppler parameters consistent with pseudonormalization (G2DD).  Right ventricular size and function normal.  No significant valvular regurgitation noted.  All transvalvular gradients were noted to be normal with no evidence suggestive of valvular stenosis.  Per cardiology, "according to the [facility]), his perioperative risk of major cardiac event is 11%. His functional capacity in METs is 8.97 according to the [facility]. Therefore, based on ACC/AHA guidelines, patient would be at an ACCEPTABLE risk for the planned procedure without further cardiovascular testing".   Neuro/Psych Cervical spinal stenosis w/ myelomalacia  Lumbar spondylosis w/ myelopathy  CVA, No Residual Symptoms  negative psych ROS   GI/Hepatic Neg liver ROS,GERD  Medicated and Controlled,,  Endo/Other  negative endocrine ROS    Renal/GU      Musculoskeletal   Abdominal   Peds  Hematology negative hematology ROS (+) Hx of DVT  On DAPT    Anesthesia Other Findings Past Medical  History: 07/18/2017: Acute ST elevation myocardial infarction (STEMI) of  inferior wall (HCC)     Comment:  a.) LHC/PCI 07/18/2017: 99% o-mRCA --> overlapping 3.0 x              38 mm and 3.25 x 18 mm Xience Sierra DES No date: Alcohol dependence, daily use (HCC)     Comment:  a.) 3-4 beers daily 11/06/2017: Angina pectoris (HCC) No date: Bilateral carpal tunnel syndrome 06/19/2017: BPH (benign prostatic hyperplasia) No date: Bradycardia 07/18/2017: Cardiac arrest with ventricular fibrillation (HCC)     Comment:  a.) in setting of acute inferior STEMI; required defib               in field by EMS and again in the ED No date: Cervical spinal stenosis     Comment:  a.) s/p ACDF C4-C6 08/22/2022 No date: Chronic deep vein thrombosis (DVT) of brachial vein of left  upper extremity (HCC) 09/29/2019: Chronic systolic congestive heart failure (HCC)     Comment:  a.) TTE 09/29/2019: EF 45-50%, glob HK, mild MR, mild PA              dil, G2DD; b.) TTE 11/28/2021: EF 45-50%, glob HK, G2DD;               c.) TTE 04/18/2022: EF 45-50%, glob HK, G2DD 07/18/2017: Coronary artery disease of native artery of native heart  with stable angina pectoris (HCC)     Comment:  a.) LHC/PCI 07/18/17: 99 o-mRCA (overlapping 3.0x74mm &               3.25x40mm Xience Sierra DES), 60 post atrio, 85 dLM-oLAD,  40 pLCx, 60 pLAD, 40 mLAD; b.) LHC 08/19/17: 85 dLM-oLAD               (PTCA), 60 pLAD, 30 mLAD, 40 pLCx, 50 RI; c.) LHC/PCI               09/09/17: unchanged LHC. 3.0 x 28mm Xience Sierra DES               dLM-oLAD; d.) LHC 11/07/17: 30 mLM, 40 pLCx, 70 RI, 40               dLAD - Rx mgmt; e.) LHC 10/17/21: 70 oRI, 20 mLAD, 30 pLCx              - Rx mgmt 06/19/2017: ED (erectile dysfunction)     Comment:  a.) on PDE5i (sildenafil) No date: Former smoker No date: GERD (gastroesophageal reflux disease) 07/26/2014: Hand dermatitis No date: Hemorrhoid No date: Hyperlipidemia No date:  Hypertension 10/14/2018: Impingement syndrome of left shoulder region No date: Ischemic cardiomyopathy     Comment:  a.) TTE 07/18/2017: EF 45-50%; b.) LHC 08/19/2017: EF               45%; c.) TTE 09/29/2019: EF 45-50%; d.) MPI 01/18/2021:               EF 47%; e.) TTE 11/28/2021: EF 45-50%; f.) TTE               04/18/2022:  EF 45-50% No date: Long term current use of aspirin No date: Long term current use of clopidogrel 11/08/2016: Lumbar spondylosis with myelopathy 06/19/2017: MCI (mild cognitive impairment) No date: Myelomalacia of cervical cord (HCC) 06/19/2017: Overweight (BMI 25.0-29.9) No date: PAD (peripheral artery disease) (HCC)     Comment:  a. 01/2019 ABIs and Duplex: ABI R 0.78, L 0.79; TBI R               0.66, L 1.11. RCIA >50p, RSFA 50-74. LCIA >50p, LSFA               75-99p, 30-49d. 3 vessel runoff bilat. No date: PAF (paroxysmal atrial fibrillation) (HCC)     Comment:  a.) brief episode during 06/2017 hospitalization for               inferior STEMI --> converted to NSR with IV amiodarone               --> not on OAC No date: Rectal polyp 09/26/2005: Stroke [facility])     Comment:  a.) seen acutely on brain MRI 09/26/2005 --> posterior               frontal, mid parietal lobe, RIGHT occipital lobe --> tiny              ischemic infarctions secondary to a shower of emboli                (watershed ischemic pattern may be present) No date: Subareolar gynecomastia in male 09/29/2020: Thrombophilia (HCC) 12/26/2020: Urinary hesitancy  Past Surgical History: 12/21/2014: ANAL FISTULECTOMY; N/A     Comment:  Procedure: FISTULECTOMY ANAL;  Surgeon: Kieth Brightly, MD;  Location: [facility] ORS;  Service: General;                Laterality: N/A; 08/22/2022: ANTERIOR CERVICAL DECOMP/DISCECTOMY FUSION; N/A     Comment:  Procedure: C4-6 ANTERIOR CERVICAL DISCECTOMY AND FUSION               (  NUVASIVE ACP, GLOBUS HEDRON);  Surgeon: Venetia Night, MD;  Location: [facility] ORS;  Service: Neurosurgery;              Laterality: N/A; 1975: APPENDECTOMY No date: CARDIAC CATHETERIZATION 02/12/2019: COLONOSCOPY WITH PROPOFOL; N/A     Comment:  Procedure: COLONOSCOPY WITH PROPOFOL;  Surgeon: Midge Minium, MD;  Location: [facility] ENDOSCOPY;  Service:               Endoscopy;  Laterality: N/A; 09/09/2017: CORONARY STENT INTERVENTION; N/A     Comment:  Procedure: CORONARY STENT INTERVENTION;  Surgeon: Iran Ouch, MD;  Location: [facility] INVASIVE CV LAB;                Service: Cardiovascular;  Laterality: N/A; 09/09/2017: CORONARY ULTRASOUND/IVUS; N/A     Comment:  Procedure: Intravascular Ultrasound/IVUS;  Surgeon:               Iran Ouch, MD;  Location: [facility] INVASIVE CV LAB;                Service: Cardiovascular;  Laterality: N/A; 07/18/2017: CORONARY/GRAFT ACUTE MI REVASCULARIZATION; N/A     Comment:  Procedure: Coronary/Graft Acute MI Revascularization;                Surgeon: Iran Ouch, MD;  Location: [facility] INVASIVE               CV LAB;  Service: Cardiovascular;  Laterality: N/A; 12/21/2014: HEMORRHOID SURGERY; N/A     Comment:  Procedure: HEMORRHOIDECTOMY;  Surgeon: Kieth Brightly, MD;  Location: [facility] ORS;  Service: General;                Laterality: N/A; age 72: KNEE SURGERY; Right 07/18/2017: LEFT HEART CATH AND CORONARY ANGIOGRAPHY; N/A     Comment:  Procedure: LEFT HEART CATH AND CORONARY ANGIOGRAPHY;                Surgeon: Iran Ouch, MD;  Location: [facility] INVASIVE               CV LAB;  Service: Cardiovascular;  Laterality: N/A; 08/19/2017: LEFT HEART CATH AND CORONARY ANGIOGRAPHY; Left     Comment:  Procedure: LEFT HEART CATH AND CORONARY ANGIOGRAPHY;                Surgeon: Iran Ouch, MD;  Location: [facility] INVASIVE               CV LAB;  Service: Cardiovascular;  Laterality: Left; 11/07/2017: LEFT HEART CATH AND CORONARY ANGIOGRAPHY; N/A      Comment:  Procedure: LEFT HEART CATH AND CORONARY ANGIOGRAPHY;                Surgeon: Iran Ouch, MD;  Location: [facility] INVASIVE               CV LAB;  Service: Cardiovascular;  Laterality: N/A; 10/17/2021: LEFT HEART CATH AND CORONARY ANGIOGRAPHY; N/A     Comment:  Procedure: LEFT HEART CATH AND CORONARY ANGIOGRAPHY;                Surgeon: Iran Ouch, MD;  Location: [facility] INVASIVE  CV LAB;  Service: Cardiovascular;  Laterality: N/A; 2013: ROTATOR CUFF REPAIR; Right  BMI    Body Mass Index: 28.35 kg/m      Reproductive/Obstetrics negative OB ROS                             Anesthesia Physical Anesthesia Plan  ASA: 3  Anesthesia Plan: General   Post-op Pain Management:    Induction: Intravenous  PONV Risk Score and Plan: 2 and Ondansetron and Dexamethasone  Airway Management Planned: LMA  Additional Equipment:   Intra-op Plan:   Post-operative Plan: Extubation in OR  Informed Consent: I have reviewed the patients History and Physical, chart, labs and discussed the procedure including the risks, benefits and alternatives for the proposed anesthesia with the patient or authorized representative who has indicated his/her understanding and acceptance.     Dental Advisory Given  Plan Discussed with: Anesthesiologist, CRNA and Surgeon  Anesthesia Plan Comments: (Patient consented for risks of anesthesia including but not limited to:  - adverse reactions to medications - damage to eyes, teeth, lips or other oral mucosa - nerve damage due to positioning  - sore throat or hoarseness - Damage to heart, brain, nerves, lungs, other parts of body or loss of life  Patient voiced understanding and assent.)       Anesthesia Quick Evaluation

## 2023-07-04 NOTE — Op Note (Signed)
07/04/2023  2:23 PM  Patient:   Todd Weaver  Pre-Op Diagnosis:   Left carpal tunnel syndrome.  Post-Op Diagnosis:   Same.  Procedure:   Endoscopic left carpal tunnel release.  Surgeon:   Maryagnes Amos, MD  Anesthesia:   General LMA  Findings:   As above.  Complications:   None  EBL:   0 cc  Fluids:   200 cc crystalloid  TT:   13 minutes at 250 mmHg  Drains:   None  Closure:   4-0 Prolene interrupted sutures  Brief Clinical Note:   The patient is a 72 year old male with a history of progressively worsening pain and paresthesias to both hands, left more symptomatic than right. His symptoms have persisted despite medications, activity modification, etc. His history and examination are consistent with carpal tunnel syndrome, confirmed by EMG. The patient presents at this time for an endoscopic left carpal tunnel release.   Procedure:   The patient was brought into the operating room and lain in the supine position. After adequate general laryngeal mask anesthesia was obtained, the left hand and upper extremity were prepped with ChloraPrep solution before being draped sterilely. Preoperative antibiotics were administered. A timeout was performed to verify the appropriate surgical site before the limb was exsanguinated with an Esmarch and the tourniquet inflated to 250 mmHg.   An approximately 1.5-2 cm incision was made over the volar wrist flexion crease, centered over the palmaris longus tendon. The incision was carried down through the subcutaneous tissues with care taken to identify and protect any neurovascular structures. The distal forearm fascia was penetrated just proximal to the transverse carpal ligament. The soft tissues were released off the superficial and deep surfaces of the distal forearm fascia and this was released proximally for 3-4 cm under direct visualization.  Attention was directed distally. The Therapist, nutritional was passed beneath the transverse carpal  ligament along the ulnar aspect of the carpal tunnel and used to release any adhesions as well as to remove any adherent synovial tissue before first the smaller then the larger of the two dilators were passed beneath the transverse carpal ligament along the ulnar margin of the carpal tunnel. The slotted cannula was introduced and the endoscope was placed into the slotted cannula and the undersurface of the transverse carpal ligament visualized. The distal margin of the transverse carpal ligament was marked by placing a 25-gauge needle percutaneously at Kaplan's cardinal point so that it entered the distal portion of the slotted cannula. Under endoscopic visualization, the transverse carpal ligament was released from proximal to distal using the end-cutting blade. A second pass was performed to ensure complete release of the ligament. The adequacy of release was verified both endoscopically and by palpation using the freer elevator.  The wound was irrigated thoroughly with sterile saline solution before being closed using 4-0 Prolene interrupted sutures. A total of 10 cc of 0.5% plain Sensorcaine was injected in and around the incision before a sterile bulky dressing was applied to the wound. The patient was placed into a volar wrist splint before being awakened, extubated, and returned to the recovery room in satisfactory condition after tolerating the procedure well.

## 2023-07-04 NOTE — Transfer of Care (Signed)
Immediate Anesthesia Transfer of Care Note  Patient: Todd Weaver  Procedure(s) Performed: CARPAL TUNNEL RELEASE ENDOSCOPIC (Left: Wrist)  Patient Location: PACU  Anesthesia Type:General  Level of Consciousness: drowsy  Airway & Oxygen Therapy: Patient Spontanous Breathing and Patient connected to nasal cannula oxygen  Post-op Assessment: Report given to RN and Post -op Vital signs reviewed and stable  Post vital signs: Reviewed and stable  Last Vitals:  Vitals Value Taken Time  BP 104/66 07/04/23 1422  Temp    Pulse 47 07/04/23 1426  Resp 7 07/04/23 1426  SpO2 99 % 07/04/23 1426  Vitals shown include unfiled device data.  Last Pain:  Vitals:   07/04/23 1130  TempSrc: Oral  PainSc: 0-No pain         Complications: No notable events documented.

## 2023-07-05 ENCOUNTER — Encounter: Payer: Self-pay | Admitting: Surgery

## 2023-07-08 ENCOUNTER — Encounter: Payer: Medicare HMO | Admitting: Neurosurgery

## 2023-07-23 ENCOUNTER — Encounter: Payer: Self-pay | Admitting: Surgery

## 2023-07-26 ENCOUNTER — Other Ambulatory Visit: Payer: Self-pay | Admitting: Cardiovascular Disease

## 2023-07-31 ENCOUNTER — Other Ambulatory Visit: Payer: Self-pay

## 2023-07-31 ENCOUNTER — Other Ambulatory Visit: Payer: Self-pay | Admitting: Orthopedic Surgery

## 2023-07-31 MED ORDER — ENTRESTO 49-51 MG PO TABS: 1.0000 | ORAL_TABLET | Freq: Two times a day (BID) | ORAL | 3 refills | Status: DC

## 2023-08-01 NOTE — Telephone Encounter (Signed)
 Patient had surgery with Dr. Joice Lofts and needs to get further neurontin refills from his office. Please let him know.   Neurontin sent back to pharmacy denied.

## 2023-08-01 NOTE — Telephone Encounter (Signed)
 Left detailed voicemail for patient about refill. Left office number/office hours as well

## 2023-08-19 ENCOUNTER — Encounter: Payer: Medicare HMO | Admitting: Orthopedic Surgery

## 2023-09-30 ENCOUNTER — Ambulatory Visit (INDEPENDENT_AMBULATORY_CARE_PROVIDER_SITE_OTHER): Admitting: Internal Medicine

## 2023-09-30 ENCOUNTER — Encounter: Payer: Self-pay | Admitting: Internal Medicine

## 2023-09-30 VITALS — BP 128/78 | HR 57 | Temp 97.5°F | Ht 67.0 in | Wt 185.0 lb

## 2023-09-30 DIAGNOSIS — H6121 Impacted cerumen, right ear: Secondary | ICD-10-CM

## 2023-09-30 NOTE — Progress Notes (Signed)
 Date:  09/30/2023   Name:  Todd Weaver   DOB:  June 14, 1951   MRN:  [id number]   Chief Complaint: Ear Fullness (Patient said his right ear feels stuffy and little pain, for a couple of weeks, no drainage )  Ear Fullness  There is pain in the right ear. This is a new problem. The current episode started 1 to 4 weeks ago. The problem occurs constantly. Pertinent negatives include no abdominal pain, coughing, diarrhea, ear discharge, headaches or hearing loss.    Review of Systems  Constitutional:  Negative for fatigue and unexpected weight change.  HENT:  Negative for ear discharge, hearing loss and nosebleeds.   Eyes:  Negative for visual disturbance.  Respiratory:  Negative for cough, chest tightness, shortness of breath and wheezing.   Cardiovascular:  Negative for chest pain, palpitations and leg swelling.  Gastrointestinal:  Negative for abdominal pain, constipation and diarrhea.  Neurological:  Negative for dizziness, weakness, light-headedness and headaches.     Lab Results  Component Value Date   NA 140 04/24/2023   K 4.4 04/24/2023   CO2 22 04/24/2023   GLUCOSE 106 (H) 04/24/2023   BUN 12 04/24/2023   CREATININE 1.22 04/24/2023   CALCIUM 9.6 04/24/2023   EGFR 64 04/24/2023   GFRNONAA >60 08/15/2022   Lab Results  Component Value Date   CHOL 168 04/24/2023   HDL 62 04/24/2023   LDLCALC 91 04/24/2023   LDLDIRECT 116 (H) 09/24/2017   TRIG 81 04/24/2023   CHOLHDL 2.7 04/24/2023   Lab Results  Component Value Date   TSH 2.390 11/07/2021   Lab Results  Component Value Date   HGBA1C 6.1 (H) 04/24/2023   Lab Results  Component Value Date   WBC 5.8 04/24/2023   HGB 14.1 04/24/2023   HCT 44.2 04/24/2023   MCV 90 04/24/2023   PLT 251 04/24/2023   Lab Results  Component Value Date   ALT 41 04/24/2023   AST 37 04/24/2023   ALKPHOS 50 04/24/2023   BILITOT 1.3 (H) 04/24/2023   No results found for: "25OHVITD2", "25OHVITD3", "VD25OH"   Patient Active  Problem List   Diagnosis Date Noted   Carpal tunnel syndrome, bilateral 05/22/2023   Prediabetes 04/24/2023   Subareolar gynecomastia in male 02/01/2023   Myelopathy (HCC) 08/22/2022   Cervical spinal stenosis 08/22/2022   Myelomalacia (HCC) 08/22/2022   Cervical myelopathy (HCC) 08/22/2022   Primary insomnia 06/18/2022   Chronic deep vein thrombosis (DVT) of brachial vein of left upper extremity (HCC) 12/28/2021   Contrast media allergy 12/12/2021   GERD (gastroesophageal reflux disease) 12/12/2021   Ischemic cardiomyopathy 12/12/2021   Urinary hesitancy 12/26/2020   Carpal tunnel syndrome on both sides 12/26/2020   Chronic systolic congestive heart failure (HCC) 09/29/2020   Thrombophilia (HCC) 09/29/2020   PAF (paroxysmal atrial fibrillation) (HCC) 04/11/2020   PAD (peripheral artery disease) (HCC) 03/31/2019   Rectal polyp    Impingement syndrome of left shoulder region 10/14/2018   Coronary artery disease of native artery of native heart with stable angina pectoris (HCC) 10/03/2018   Essential hypertension 06/19/2017   Mixed hyperlipidemia 06/19/2017   Overweight (BMI 25.0-29.9) 06/19/2017   BPH (benign prostatic hyperplasia) 06/19/2017   ED (erectile dysfunction) 06/19/2017   MCI (mild cognitive impairment) 06/19/2017   Lumbar spondylosis with myelopathy 11/08/2016   Hx of transient ischemic attack (TIA) 2011    Allergies  Allergen Reactions   Codeine Hives and Other (See Comments)   Shellfish Allergy Hives  and Swelling   Contrast Media [Iodinated Contrast Media] Itching and Other (See Comments)    Whelts on tongue   Fish Allergy Itching    Past Surgical History:  Procedure Laterality Date   ANAL FISTULECTOMY N/A 12/21/2014   Procedure: FISTULECTOMY ANAL;  Surgeon: Kieth Brightly, MD;  Location: [facility];  Service: General;  Laterality: N/A;   ANTERIOR CERVICAL DECOMP/DISCECTOMY FUSION N/A 08/22/2022   Procedure: C4-6 ANTERIOR CERVICAL DISCECTOMY AND FUSION  (NUVASIVE ACP, GLOBUS HEDRON);  Surgeon: Venetia Night, MD;  Location: [facility];  Service: Neurosurgery;  Laterality: N/A;   APPENDECTOMY  1975   CARDIAC CATHETERIZATION     CARPAL TUNNEL RELEASE Left 07/04/2023   Procedure: CARPAL TUNNEL RELEASE ENDOSCOPIC;  Surgeon: Christena Flake, MD;  Location: [facility];  Service: Orthopedics;  Laterality: Left;   COLONOSCOPY WITH PROPOFOL N/A 02/12/2019   Procedure: COLONOSCOPY WITH PROPOFOL;  Surgeon: Midge Minium, MD;  Location: [facility]COPY;  Service: Endoscopy;  Laterality: N/A;   CORONARY STENT INTERVENTION N/A 09/09/2017   Procedure: CORONARY STENT INTERVENTION;  Surgeon: Iran Ouch, MD;  Location: [facility];  Service: Cardiovascular;  Laterality: N/A;   CORONARY ULTRASOUND/IVUS N/A 09/09/2017   Procedure: Intravascular Ultrasound/IVUS;  Surgeon: Iran Ouch, MD;  Location: [facility];  Service: Cardiovascular;  Laterality: N/A;   CORONARY/GRAFT ACUTE MI REVASCULARIZATION N/A 07/18/2017   Procedure: Coronary/Graft Acute MI Revascularization;  Surgeon: Iran Ouch, MD;  Location: [facility];  Service: Cardiovascular;  Laterality: N/A;   HEMORRHOID SURGERY N/A 12/21/2014   Procedure: HEMORRHOIDECTOMY;  Surgeon: Kieth Brightly, MD;  Location: [facility];  Service: General;  Laterality: N/A;   KNEE SURGERY Right age 59   LEFT HEART CATH AND CORONARY ANGIOGRAPHY N/A 07/18/2017   Procedure: LEFT HEART CATH AND CORONARY ANGIOGRAPHY;  Surgeon: Iran Ouch, MD;  Location: [facility];  Service: Cardiovascular;  Laterality: N/A;   LEFT HEART CATH AND CORONARY ANGIOGRAPHY Left 08/19/2017   Procedure: LEFT HEART CATH AND CORONARY ANGIOGRAPHY;  Surgeon: Iran Ouch, MD;  Location: [facility];  Service: Cardiovascular;  Laterality: Left;   LEFT HEART CATH AND CORONARY ANGIOGRAPHY N/A 11/07/2017   Procedure: LEFT HEART CATH AND CORONARY ANGIOGRAPHY;  Surgeon: Iran Ouch, MD;   Location: [facility];  Service: Cardiovascular;  Laterality: N/A;   LEFT HEART CATH AND CORONARY ANGIOGRAPHY N/A 10/17/2021   Procedure: LEFT HEART CATH AND CORONARY ANGIOGRAPHY;  Surgeon: Iran Ouch, MD;  Location: [facility];  Service: Cardiovascular;  Laterality: N/A;   ROTATOR CUFF REPAIR Right 2013    Social History   Tobacco Use   Smoking status: Former    Current packs/day: 0.00    Types: Cigarettes    Quit date: 07/24/2007    Years since quitting: 16.1    Passive exposure: Past   Smokeless tobacco: Former    Types: Chew   Tobacco comments:    smoking cessation materials not required  Vaping Use   Vaping status: Never Used  Substance Use Topics   Alcohol use: Yes    Alcohol/week: 5.0 - 6.0 standard drinks of alcohol    Types: 5 - 6 Cans of beer per week    Comment:  weekly   Drug use: No     Medication list has been reviewed and updated.  Current Meds  Medication Sig   amLODipine (NORVASC) 2.5 MG tablet Take 2.5 mg by mouth every morning.  aspirin EC 81 MG tablet Take 81 mg by mouth daily.    carvedilol (COREG) 3.125 MG tablet TAKE 1 TABLET TWICE DAILY WITH MEALS   clopidogrel (PLAVIX) 75 MG tablet Take 75 mg by mouth daily.   clotrimazole-betamethasone (LOTRISONE) cream Apply 1 Application topically daily as needed (irritation).   diclofenac Sodium (VOLTAREN) 1 % GEL Apply 4 g topically 4 (four) times daily as needed.   ezetimibe (ZETIA) 10 MG tablet Take 1 tablet (10 mg total) by mouth every morning.   gabapentin (NEURONTIN) 300 MG capsule Take 1 capsule (300 mg total) by mouth every morning.   HYDROcodone-acetaminophen (NORCO/VICODIN) 5-325 MG tablet Take 1-2 tablets by mouth every 6 (six) hours as needed for moderate pain (pain score 4-6) or severe pain (pain score 7-10).   methocarbamol (ROBAXIN) 500 MG tablet Take 500 mg by mouth every 6 (six) hours as needed for muscle spasms.   nitroGLYCERIN (NITROSTAT) 0.4 MG SL tablet Place 1  tablet (0.4 mg total) under the tongue every 5 (five) minutes as needed for chest pain.   omeprazole (PRILOSEC) 40 MG capsule Take 1 capsule (40 mg total) by mouth daily as needed (acid reflux).   oxybutynin (DITROPAN-XL) 10 MG 24 hr tablet Take 10 mg by mouth every morning.   rosuvastatin (CRESTOR) 40 MG tablet Take 1 tablet (40 mg total) by mouth every evening.   sacubitril-valsartan (ENTRESTO) 49-51 MG Take 1 tablet by mouth 2 (two) times daily.   sennosides-docusate sodium (SENOKOT-S) 8.6-50 MG tablet Take 1 tablet by mouth daily.   sildenafil (REVATIO) 20 MG tablet Take 20 mg by mouth 3 (three) times daily.   spironolactone (ALDACTONE) 25 MG tablet Take 25 mg by mouth daily.   tamsulosin (FLOMAX) 0.4 MG CAPS capsule Take 1 capsule (0.4 mg total) by mouth daily after breakfast.       09/30/2023    8:14 AM 04/24/2023    9:43 AM 01/09/2023   10:38 AM 09/07/2022    2:59 PM  GAD 7 : Generalized Anxiety Score  Nervous, Anxious, on Edge 1 0 0 0  Control/stop worrying 0 0 0 0  Worry too much - different things 0 0 0 0  Trouble relaxing 2 1 0 1  Restless 2 1 0 1  Easily annoyed or irritable 1 0 0 1  Afraid - awful might happen 0 0 0 0  Total GAD 7 Score 6 2 0 3  Anxiety Difficulty  Not difficult at all Not difficult at all Not difficult at all       09/30/2023    8:14 AM 04/24/2023    9:43 AM 04/18/2023    8:16 AM  Depression screen PHQ 2/9  Decreased Interest 1 0 0  Down, Depressed, Hopeless 0 0 0  PHQ - 2 Score 1 0 0  Altered sleeping 2 0   Tired, decreased energy 1 1   Change in appetite 0 0   Feeling bad or failure about yourself  0 0   Trouble concentrating 0 1   Moving slowly or fidgety/restless 0 1   Suicidal thoughts 0 0   PHQ-9 Score 4 3   Difficult doing work/chores  Not difficult at all     BP Readings from Last 3 Encounters:  09/30/23 128/78  07/04/23 (!) 168/76  05/22/23 128/80    Physical Exam Vitals and nursing note reviewed.  Constitutional:       General: He is not in acute distress.    Appearance: He is well-developed.  HENT:     Head: Normocephalic and atraumatic.     Comments: Right ear was irrigated and a large amount of cerumen was removed.  He tolerated the procedure well and had resolution of ear fullness.    Right Ear: There is impacted cerumen.     Left Ear: Tympanic membrane normal.     Nose:     Right Sinus: No maxillary sinus tenderness.     Left Sinus: No maxillary sinus tenderness.  Pulmonary:     Effort: Pulmonary effort is normal. No respiratory distress.  Lymphadenopathy:     Cervical: No cervical adenopathy.  Skin:    General: Skin is warm and dry.     Findings: No rash.  Neurological:     Mental Status: He is alert and oriented to person, place, and time.  Psychiatric:        Mood and Affect: Mood normal.        Behavior: Behavior normal.     Wt Readings from Last 3 Encounters:  09/30/23 185 lb (83.9 kg)  07/04/23 181 lb (82.1 kg)  06/28/23 181 lb (82.1 kg)    BP 128/78   Pulse (!) 57   Temp (!) 97.5 F (36.4 C)   Ht 5\' 7"  (1.702 m)   Wt 185 lb (83.9 kg)   SpO2 96%   BMI 28.98 kg/m   Assessment and Plan:  Problem List Items Addressed This Visit   None Visit Diagnoses       Hearing loss of right ear due to cerumen impaction    -  Primary   symptoms resolved after irrigation and cerumen removal.       No follow-ups on file.    Reubin Milan, MD [facility] [place]

## 2023-10-02 ENCOUNTER — Other Ambulatory Visit: Payer: Self-pay | Admitting: Urology

## 2023-10-14 ENCOUNTER — Other Ambulatory Visit: Payer: Self-pay | Admitting: Cardiovascular Disease

## 2023-10-16 ENCOUNTER — Other Ambulatory Visit: Payer: Self-pay | Admitting: Cardiovascular Disease

## 2023-11-01 ENCOUNTER — Telehealth: Payer: Self-pay | Admitting: Cardiovascular Disease

## 2023-11-01 ENCOUNTER — Other Ambulatory Visit: Payer: Self-pay

## 2023-11-01 MED ORDER — ENTRESTO 49-51 MG PO TABS: 1.0000 | ORAL_TABLET | Freq: Two times a day (BID) | ORAL | 0 refills | Status: DC

## 2023-11-01 NOTE — Telephone Encounter (Signed)
*  STAT* If patient is at the pharmacy, call can be transferred to refill team.   1. Which medications need to be refilled? (please list name of each medication and dose if known)   sacubitril-valsartan (ENTRESTO) 49-51 MG   2. Which pharmacy/location (including street and city if local pharmacy) is medication to be sent to? [facility] - [place], [place] - [address] Phone: [phone]  Fax: [phone]     3. Do they need a 30 day or 90 day supply? 90

## 2023-11-05 NOTE — Progress Notes (Unsigned)
 Cardiology Office Note    Date:  11/06/2023   ID:  Todd Weaver, DOB September 14, 1950, MRN [id number]  PCP:  Reubin Milan, MD  Cardiologist:  Lorine Bears, MD  Electrophysiologist:  None   Chief Complaint: Follow-up  History of Present Illness:   Todd Weaver is a 73 y.o. male with history of CAD with inferior ST elevation MI complicated by ventricular fibrillation arrest in 06/2017 status post PCI/DES with overlapping drug-eluting stent placement to the RCA and A-fib post procedure status post staged IVUS guided DES to the proximal and ostial LAD in 08/2017, chronic combined systolic and diastolic CHF, CVA, PAD followed by vascular surgery, HTN, HLD, prior tobacco use, bilateral carpal tunnel syndrome status post repair, and BPH who presents for follow-up of CAD and cardiomyopathy.   He was admitted in 06/2017 with an inferior ST elevation MI complicated by ventricular fibrillation arrest.  He underwent successful PCI/DES with 2 overlapping drug-eluting stents to the RCA.  LHC at that time also demonstrated significant ostial LAD stenosis, borderline significant proximal LAD stenosis, and moderate distal LAD stenosis.  LVEF was mildly reduced.  Postprocedure, he had A-fib that was successfully treated with amiodarone.  He underwent staged IVUS guided PCI/DES to the ostial and proximal LAD in 08/2017.  Repeat LHC in 10/2017 showed widely patent LAD and RCA stents with no significant restenosis.  The ramus branch was pinched by the LAD stent with 70% ostial stenosis.  LVEF was 50 to 55%.  Nuclear stress test in 08/2019 showed no evidence of ischemia with normal ejection fraction.  Echo in 09/2019 showed an EF of 45 to 50% with mild mitral regurgitation.  Lexiscan MPI on 01/18/2021 which was overall low risk and showed no evidence of ischemia.  LVEF appeared visually normal, though calculated value was mildly reduced at 47%.  In the setting of continued left arm and shoulder discomfort and exertional  fatigue he underwent LHC on 10/17/2021 that demonstrated widely patent RCA and LAD stents with no significant restenosis.  Stable 70% stenosis in the ostial ramus.  Otherwise, no obstructive disease.  Mildly reduced LV systolic function and mildly elevated LVEDP were noted.  Medical therapy was recommended.  Subsequent left upper extremity venous duplex demonstrated a chronic thrombus involving one of the left brachial veins.  Case was discussed with the patient's primary cardiologist with no intervention indicated.  Subsequent hypercoagulable workup was unrevealing.  Left upper extremity arterial ultrasound showed triphasic arterial flow throughout.  Echo on 11/28/2021 demonstrated an EF of 45 to 50%, global hypokinesis, grade 2 diastolic dysfunction, normal RV systolic function and ventricular cavity size, no significant valvular abnormalities, and an estimated right atrial pressure of 3 mmHg.  Limited echo in 03/2022 demonstrated an EF of 45 to 50%, global hypokinesis, grade 2 diastolic dysfunction, normal RV systolic function, and no significant valvular abnormalities.  Escalation of GDMT has had times been limited by orthostasis and constipation.  He was last seen in the office in 08/2022 with recommendation to decrease amlodipine to 2.5 mg.  He comes in accompanied by his wife and reports an approximate 78-month history of exertional fatigue and shortness of breath without chest pain.  He does notice some left shoulder and arm discomfort when he is laying on his left side that improves with changing sleeping positions.  No arm or shoulder discomfort with exertion.  No dizziness, presyncope, or syncope.  No significant lower extremity swelling or progressive orthopnea.  Weight up 3 pounds today when compared  to his visit in 08/2022.  Reports adherence to pharmacotherapy without off target effect.  No falls or symptoms concerning for bleeding.   Labs independently reviewed: 04/2023 - A1c 6.1, TC 168, TG 81, HDL  62, LDL 91, BUN 12, serum creatinine 1.22, potassium 4.4, albumin 4.6, AST/ALT normal, Hgb 14.1, PLT 251 10/2021 - TSH normal  Past Medical History:  Diagnosis Date   Acute ST elevation myocardial infarction (STEMI) of inferior wall (HCC) 07/18/2017   a.) LHC/PCI 07/18/2017: 99% o-mRCA --> overlapping 3.0 x 38 mm and 3.25 x 18 mm Xience Sierra DES   Alcohol dependence, daily use (HCC)    a.) 3-4 beers daily   Angina pectoris (HCC) 11/06/2017   Bilateral carpal tunnel syndrome    BPH (benign prostatic hyperplasia) 06/19/2017   Bradycardia    Cardiac arrest with ventricular fibrillation (HCC) 07/18/2017   a.) in setting of acute inferior STEMI; required defib in field by EMS and again in the ED   Cervical spinal stenosis    a.) s/p ACDF C4-C6 08/22/2022   Chronic deep vein thrombosis (DVT) of brachial vein of left upper extremity (HCC)    Chronic systolic congestive heart failure (HCC) 09/29/2019   a.) TTE 09/29/2019: EF 45-50%, glob HK, mild MR, mild PA dil, G2DD; b.) TTE 11/28/2021: EF 45-50%, glob HK, G2DD; c.) TTE 04/18/2022: EF 45-50%, glob HK, G2DD   Coronary artery disease of native artery of native heart with stable angina pectoris (HCC) 07/18/2017   a.) LHC/PCI 07/18/17: 99 o-mRCA (overlapping 3.0x44mm & 3.25x33mm Xience Sierra DES), 60 post atrio, 85 dLM-oLAD, 40 pLCx, 60 pLAD, 40 mLAD; b.) LHC 08/19/17: 85 dLM-oLAD (PTCA), 60 pLAD, 30 mLAD, 40 pLCx, 50 RI; c.) LHC/PCI 09/09/17: unchanged LHC. 3.0 x 28mm Xience Sierra DES dLM-oLAD; d.) LHC 11/07/17: 30 mLM, 40 pLCx, 70 RI, 40 dLAD - Rx mgmt; e.) LHC 10/17/21: 70 oRI, 20 mLAD, 30 pLCx - Rx mgmt   ED (erectile dysfunction) 06/19/2017   a.) on PDE5i (sildenafil)   Former smoker    GERD (gastroesophageal reflux disease)    Hand dermatitis 07/26/2014   Hemorrhoid    Hyperlipidemia    Hypertension    Impingement syndrome of left shoulder region 10/14/2018   Ischemic cardiomyopathy    a.) TTE 07/18/2017: EF 45-50%; b.) LHC 08/19/2017: EF  45%; c.) TTE 09/29/2019: EF 45-50%; d.) MPI 01/18/2021: EF 47%; e.) TTE 11/28/2021: EF 45-50%; f.) TTE 04/18/2022:  EF 45-50%   Long term current use of aspirin    Long term current use of clopidogrel    Lumbar spondylosis with myelopathy 11/08/2016   MCI (mild cognitive impairment) 06/19/2017   Myelomalacia of cervical cord (HCC)    Overweight (BMI 25.0-29.9) 06/19/2017   PAD (peripheral artery disease) (HCC)    a. 01/2019 ABIs and Duplex: ABI R 0.78, L 0.79; TBI R 0.66, L 1.11. RCIA >50p, RSFA 50-74. LCIA >50p, LSFA 75-99p, 30-49d. 3 vessel runoff bilat.   PAF (paroxysmal atrial fibrillation) (HCC)    a.) brief episode during 06/2017 hospitalization for inferior STEMI --> converted to NSR with IV amiodarone --> not on [facility]   Rectal polyp    Stroke (HCC) 09/26/2005   a.) seen acutely on brain MRI 09/26/2005 --> posterior frontal, mid parietal lobe, RIGHT occipital lobe --> tiny ischemic infarctions secondary to a shower of emboli  (watershed ischemic pattern may be present)   Subareolar gynecomastia in male    Thrombophilia (HCC) 09/29/2020   Urinary hesitancy 12/26/2020    Past Surgical  History:  Procedure Laterality Date   ANAL FISTULECTOMY N/A 12/21/2014   Procedure: FISTULECTOMY ANAL;  Surgeon: Jerlean Mood, MD;  Location: [facility] ORS;  Service: General;  Laterality: N/A;   ANTERIOR CERVICAL DECOMP/DISCECTOMY FUSION N/A 08/22/2022   Procedure: C4-6 ANTERIOR CERVICAL DISCECTOMY AND FUSION (NUVASIVE ACP, GLOBUS HEDRON);  Surgeon: Jodeen Munch, MD;  Location: [facility] ORS;  Service: Neurosurgery;  Laterality: N/A;   APPENDECTOMY  1975   CARDIAC CATHETERIZATION     CARPAL TUNNEL RELEASE Left 07/04/2023   Procedure: CARPAL TUNNEL RELEASE ENDOSCOPIC;  Surgeon: Elner Hahn, MD;  Location: [facility] ORS;  Service: Orthopedics;  Laterality: Left;   COLONOSCOPY WITH PROPOFOL N/A 02/12/2019   Procedure: COLONOSCOPY WITH PROPOFOL;  Surgeon: Marnee Sink, MD;  Location: [facility] ENDOSCOPY;   Service: Endoscopy;  Laterality: N/A;   CORONARY STENT INTERVENTION N/A 09/09/2017   Procedure: CORONARY STENT INTERVENTION;  Surgeon: Wenona Hamilton, MD;  Location: [facility] INVASIVE CV LAB;  Service: Cardiovascular;  Laterality: N/A;   CORONARY ULTRASOUND/IVUS N/A 09/09/2017   Procedure: Intravascular Ultrasound/IVUS;  Surgeon: Wenona Hamilton, MD;  Location: [facility] INVASIVE CV LAB;  Service: Cardiovascular;  Laterality: N/A;   CORONARY/GRAFT ACUTE MI REVASCULARIZATION N/A 07/18/2017   Procedure: Coronary/Graft Acute MI Revascularization;  Surgeon: Wenona Hamilton, MD;  Location: [facility] INVASIVE CV LAB;  Service: Cardiovascular;  Laterality: N/A;   HEMORRHOID SURGERY N/A 12/21/2014   Procedure: HEMORRHOIDECTOMY;  Surgeon: Jerlean Mood, MD;  Location: [facility] ORS;  Service: General;  Laterality: N/A;   KNEE SURGERY Right age 33   LEFT HEART CATH AND CORONARY ANGIOGRAPHY N/A 07/18/2017   Procedure: LEFT HEART CATH AND CORONARY ANGIOGRAPHY;  Surgeon: Wenona Hamilton, MD;  Location: [facility] INVASIVE CV LAB;  Service: Cardiovascular;  Laterality: N/A;   LEFT HEART CATH AND CORONARY ANGIOGRAPHY Left 08/19/2017   Procedure: LEFT HEART CATH AND CORONARY ANGIOGRAPHY;  Surgeon: Wenona Hamilton, MD;  Location: [facility] INVASIVE CV LAB;  Service: Cardiovascular;  Laterality: Left;   LEFT HEART CATH AND CORONARY ANGIOGRAPHY N/A 11/07/2017   Procedure: LEFT HEART CATH AND CORONARY ANGIOGRAPHY;  Surgeon: Wenona Hamilton, MD;  Location: [facility] INV[facility] CV LAB;  Service: Cardiovascular;  Laterality: N/A;   LEFT HEART CATH AND CORONARY ANGIOGRAPHY N/A 10/17/2021   Procedure: LEFT HEART CATH AND CORONARY ANGIOGRAPHY;  Surgeon: Wenona Hamilton, MD;  Location: [facility] INVASIVE CV LAB;  Service: Cardiovascular;  Laterality: N/A;   ROTATOR CUFF REPAIR Right 2013    Current Medications: Current Meds  Medication Sig   amLODipine (NORVASC) 2.5 MG tablet Take 2.5 mg by mouth every morning.   aspirin EC 81 MG tablet Take  81 mg by mouth daily.    carvedilol (COREG) 3.125 MG tablet TAKE 1 TABLET TWICE DAILY WITH MEALS   clopidogrel (PLAVIX) 75 MG tablet Take 75 mg by mouth daily.   diclofenac Sodium (VOLTAREN) 1 % GEL Apply 4 g topically 4 (four) times daily as needed.   ezetimibe (ZETIA) 10 MG tablet Take 1 tablet (10 mg total) by mouth every morning.   gabapentin (NEURONTIN) 300 MG capsule Take 1 capsule (300 mg total) by mouth every morning.   nitroGLYCERIN (NITROSTAT) 0.4 MG SL tablet Place 1 tablet (0.4 mg total) under the tongue every 5 (five) minutes as needed for chest pain.   omeprazole (PRILOSEC) 40 MG capsule Take 1 capsule (40 mg total) by mouth daily as needed (acid reflux).   rosuvastatin (CRESTOR) 40 MG tablet Take 1 tablet (40 mg total) by mouth every evening.  sacubitril-valsartan (ENTRESTO) 49-51 MG Take 1 tablet by mouth 2 (two) times daily.   sildenafil (REVATIO) 20 MG tablet Take 20 mg by mouth 3 (three) times daily.   spironolactone (ALDACTONE) 25 MG tablet Take 25 mg by mouth daily.   tamsulosin (FLOMAX) 0.4 MG CAPS capsule TAKE 1 CAPSULE EVERY DAY    Allergies:   Codeine, Shellfish allergy, Contrast media [iodinated contrast media], and Fish allergy   Social History   Socioeconomic History   Marital status: Married    Spouse name: Raj Janus   Number of children: 2   Years of education: Not on file   Highest education level: 11th grade  Occupational History   Occupation: Retired  Tobacco Use   Smoking status: Former    Current packs/day: 0.00    Types: Cigarettes    Quit date: 07/24/2007    Years since quitting: 16.2    Passive exposure: Past   Smokeless tobacco: Former    Types: Chew   Tobacco comments:    smoking cessation materials not required  Vaping Use   Vaping status: Never Used  Substance and Sexual Activity   Alcohol use: Yes    Alcohol/week: 5.0 - 6.0 standard drinks of alcohol    Types: 5 - 6 Cans of beer per week    Comment:  weekly   Drug use: No    Sexual activity: Not Currently  Other Topics Concern   Not on file  Social History Narrative   Lives in [place]. Retired.  Completed cardiac rehab program.   Social Drivers of Health   Financial Resource Strain: Low Risk  (12/27/2022)   Overall Financial Resource Strain (CARDIA)    Difficulty of Paying Living Expenses: Not hard at all  Food Insecurity: No Food Insecurity (12/27/2022)   Hunger Vital Sign    Worried About Running Out of Food in the Last Year: Never true    Ran Out of Food in the Last Year: Never true  Transportation Needs: No Transportation Needs (12/27/2022)   PRAPARE - Administrator, Civil Service (Medical): No    Lack of Transportation (Non-Medical): No  Physical Activity: Insufficiently Active (12/27/2022)   Exercise Vital Sign    Days of Exercise per Week: 2 days    Minutes of Exercise per Session: 20 min  Stress: No Stress Concern Present (12/27/2022)   [employer] of Occupational Health - Occupational Stress Questionnaire    Feeling of Stress : Not at all  Social Connections: Socially Integrated (12/27/2022)   Social Connection and Isolation Panel [NHANES]    Frequency of Communication with Friends and Family: More than three times a week    Frequency of Social Gatherings with Friends and Family: More than three times a week    Attends Religious Services: More than 4 times per year    Active Member of [employer] or Organizations: Yes    Attends Engineer, structural: More than 4 times per year    Marital Status: Married     Family History:  The patient's family history includes Alzheimer's disease in his father and mother; Hypertension in his father and mother. There is no history of Breast cancer.  ROS:   12-point review of systems is negative unless otherwise noted in the HPI.   EKGs/Labs/Other Studies Reviewed:    Studies reviewed were summarized above. The additional studies were reviewed today:   Limited echo 04/18/2022: 1. Left  ventricular ejection fraction, by estimation, is 45 to 50%. The  left ventricle has mildly decreased function. The left ventricle  demonstrates global hypokinesis. Left ventricular diastolic parameters are  consistent with Grade II diastolic  dysfunction (pseudonormalization).   2. Right ventricular systolic function is normal.   3. The mitral valve is normal in structure. No evidence of mitral valve  regurgitation.   Comparison(s): Previous LVEF reported as 45-50%  Previous GLS reported as -17.5.  __________   Zio patch 11/2021: Patient had a min HR of 42 bpm, max HR of 136 bpm, and avg HR of 66 bpm. Predominant underlying rhythm was Sinus Rhythm.  1 run of Supraventricular Tachycardia occurred lasting 6 beats with a max rate of 135 bpm (avg 105 bpm). Rare PACs and rare PVCs with a burden of less than 1%. No significant arrhythmia overall. __________   2D echo 11/28/2021: 1. Left ventricular ejection fraction, by estimation, is 45 to 50%. The  left ventricle has mildly decreased function. The left ventricle  demonstrates global hypokinesis. Left ventricular diastolic parameters are  consistent with Grade II diastolic  dysfunction (pseudonormalization). The average left ventricular global  longitudinal strain is -17.5 %. The global longitudinal strain is normal.   2. Right ventricular systolic function is normal. The right ventricular  size is normal.   3. The mitral valve is normal in structure. No evidence of mitral valve  regurgitation. No evidence of mitral stenosis.   4. The aortic valve is normal in structure. Aortic valve regurgitation is  not visualized. No aortic stenosis is present.   5. The inferior vena cava is normal in size with greater than 50%  respiratory variability, suggesting right atrial pressure of 3 mmHg. __________   LHC 10/17/2021:   Ost Ramus lesion is 70% stenosed.   Mid LAD lesion is 20% stenosed.   Prox Cx lesion is 30% stenosed.   Non-stenotic Prox  LAD lesion was previously treated.   Non-stenotic Ost RCA to Mid RCA lesion was previously treated.   There is mild left ventricular systolic dysfunction.   LV end diastolic pressure is mildly elevated.   The left ventricular ejection fraction is 45-50% by visual estimate.   1.  Widely patent RCA and LAD stents with no significant restenosis.  Stable 70% stenosis in ostial ramus.  No other obstructive disease. 2.  Mildly reduced LV systolic function and mildly elevated left ventricular end-diastolic pressure.   Recommendations: No culprit is identified for the patient's left arm pain.  Continue aggressive medical therapy for coronary artery disease. __________   Sal Crass MPI 01/18/2021: T wave inversion was noted during stress in the V6, V5, II, III and aVF leads. There was no ST segment deviation noted during stress. The study is normal. This is a low risk study. The left ventricular ejection fraction visually appears normal, although calculated value is mildly decreased at 47%. Correlation with echo advised. There is no evidence for ischemia. __________   ABIs 02/05/2020: Bilateral ABIs appear essentially unchanged compared to prior study on  01/26/19. ABIs and right TBI are consistent with previous exam. Drop in left  TBI but still normal.     Summary:  Right: Resting right ankle-brachial index indicates moderate right lower  extremity arterial disease. The right toe-brachial index is abnormal.   Left: Resting left ankle-brachial index indicates moderate left lower  extremity arterial disease. The left toe-brachial index is normal. __________   2D echo 09/29/2019: 1. Left ventricular ejection fraction, by estimation, is 45 to 50%. The  left ventricle has mildly decreased function. The left  ventricle  demonstrates global hypokinesis. Left ventricular diastolic parameters are  consistent with Grade II diastolic  dysfunction (pseudonormalization). Elevated left atrial pressure.    2. Right ventricular systolic function is normal. The right ventricular  size is normal. Tricuspid regurgitation signal is inadequate for assessing  PA pressure.   3. The mitral valve is grossly normal. Mild mitral valve regurgitation.  No evidence of mitral stenosis.   4. The aortic valve is tricuspid. Aortic valve regurgitation is not  visualized. No aortic stenosis is present.   5. Mildly dilated pulmonary artery.   6. The inferior vena cava is normal in size with greater than 50%  respiratory variability, suggesting right atrial pressure of 3 mmHg. __________   2D echo 08/24/2019: There was no ST segment deviation noted during stress. The study is normal. This is a low risk study. The left ventricular ejection fraction is normal (55-65%). __________   LHC 11/07/2017: Mid LAD lesion is 30% stenosed. Previously placed Prox LAD drug eluting stent is widely patent. Prox Cx lesion is 40% stenosed. Non-stenotic Ost RCA to Mid RCA lesion previously treated. Ost Ramus lesion is 70% stenosed. Dist LAD lesion is 40% stenosed. LV end diastolic pressure is mildly elevated. There is mild left ventricular systolic dysfunction. The left ventricular ejection fraction is 50-55% by visual estimate.   1.  Widely patent LAD and RCA stents with no significant restenosis.  There is a relatively small size ramus br which is pinched by the LAD stent with 70% ostial stenosis.  No other obstructive disease. 2.  Low normal LV systolic function with an EF of 50-55% with mild inferior wall hypokinesis.  Mildly elevated left ventricular end-diastolic pressure.   Recommendations: Continue medical therapy.  Add sublingual nitroglycerin to be used as needed. The patient can be discharged home from a cardiac standpoint. __________   [facility] 09/09/2017: Dist LM to Ost LAD lesion is 85% stenosed. Prox LAD lesion is 60% stenosed. Mid LAD lesion is 30% stenosed. Prox Cx lesion is 40% stenosed. Non-stenotic Ost RCA  to Mid RCA lesion previously treated. Ost Ramus lesion is 50% stenosed. Post intervention, there is a 0% residual stenosis. Post intervention, there is a 0% residual stenosis. A drug-eluting stent was successfully placed using a STENT SIERRA 3.00 X 38 MM.   Successful IVUS guided drug-eluting stent placement to the proximal and ostial LAD with one long stent.   Recommendations: Dual antiplatelet therapy for at least one year.  Aggressive treatment of risk factors. __________   LHC 08/19/2017: Dist LM to Ost LAD lesion is 85% stenosed. Prox LAD lesion is 60% stenosed. Mid LAD lesion is 30% stenosed. Prox Cx lesion is 40% stenosed. Previously placed Ost RCA to Mid RCA drug eluting stent and stent (unknown type) is widely patent. Balloon angioplasty was performed. Ost Ramus lesion is 50% stenosed. There is mild left ventricular systolic dysfunction. LV end diastolic pressure is normal. The left ventricular ejection fraction is 45-50% by visual estimate.   1.  Significant underlying two-vessel coronary artery disease with widely patent stents in the right coronary artery without any restenosis.  Significant ostial LAD stenosis is unchanged with moderate disease in the proximal and mid segment. 2.  Mildly reduced LV systolic function with an EF of 45% with inferior wall hypokinesis.  Normal left ventricular end-diastolic pressure.   Recommendations: Continue medical therapy for now.  I will discuss with my colleagues about management options of ostial LAD stenosis with PCI versus one-vessel CABG. __________   2D  echo 07/18/2017: - Left ventricle: The cavity size was normal. There was mild    concentric hypertrophy. Systolic function was mildly reduced. The    estimated ejection fraction was in the range of 45% to 50%.    Moderate hypokinesis of the inferior and inferoseptal myocardium.    The study was not technically sufficient to allow evaluation of    LV diastolic dysfunction due to  atrial fibrillation.  - Left atrium: The atrium was mildly dilated. __________   [facility] 09/18/2016: Ost RCA to Mid RCA lesion is 99% stenosed. A drug-eluting stent was successfully placed using a STENT SIERRA 3.00 X 38 MM. Post intervention, there is a 0% residual stenosis. A stent was successfully placed. Post Atrio lesion is 60% stenosed. Dist LM to Ost LAD lesion is 85% stenosed. Prox Cx lesion is 40% stenosed. Prox LAD lesion is 60% stenosed. Mid LAD lesion is 40% stenosed.   1.  Significant two-vessel coronary artery disease with subtotal occlusion of the proximal right coronary artery which is the culprit for inferior ST elevation myocardial infarction.  There is also 85% ostial LAD stenosis.  There is a medium sized ramus branch with 60% ostial stenosis. 2.  Normal left ventricular end-diastolic pressure. 3.  Successful complex angioplasty and 2 overlapped drug-eluting stent placement to the proximal and ostial right coronary artery.   Recommendations: The patient had hypotension throughout PCI likely due to RV involvement.  He responded to IV fluids and norepinephrine drip.  Continue dual antiplatelet therapy for at least one year.  Aggressive treatment of risk factors. Further ischemic evaluation of the LAD will be required in the near future.  Angiography was suboptimal given the patient's significant tachycardia. This was an overall difficult procedure due to long diffuse disease. __________   2D echo 04/09/2016: - Left ventricle: The cavity size was normal. Wall thickness was    normal. Systolic function was normal. The estimated ejection    fraction was in the range of 55% to 60%. Wall motion was normal;    there were no regional wall motion abnormalities. Left    ventricular diastolic function parameters were normal.   Impressions:   - Normal study. __________   Sal Crass MPI 04/06/2016: There was no ST segment deviation noted during stress. No T wave inversion was  noted during stress. This is a low risk study. The left ventricular ejection fraction is normal (55-65%). Defect 1: There is a small defect of mild severity present in the apex location. This is patrially reversible and suspected to be due to an artifact given intense GI uptake which affected the quality of study. Correlate clinically    EKG:  EKG is ordered today.  The EKG ordered today demonstrates sinus bradycardia, 55 bpm, nonspecific inferior ST-T changes  Recent Labs: 04/24/2023: ALT 41; BUN 12; Creatinine, Ser 1.22; Hemoglobin 14.1; Platelets 251; Potassium 4.4; Sodium 140  Recent Lipid Panel    Component Value Date/Time   CHOL 168 04/24/2023 1034   TRIG 81 04/24/2023 1034   HDL 62 04/24/2023 1034   CHOLHDL 2.7 04/24/2023 1034   CHOLHDL 2.8 11/07/2017 0511   VLDL 4 11/07/2017 0511   LDLCALC 91 04/24/2023 1034   LDLDIRECT 116 (H) 09/24/2017 1338    PHYSICAL EXAM:    VS:  BP (!) 140/92 (BP Location: Left Arm, Patient Position: Sitting, Cuff Size: Large)   Pulse (!) 55   Wt 181 lb (82.1 kg)   SpO2 97%   BMI 28.35 kg/m   BMI: Body  mass index is 28.35 kg/m.  Physical Exam Vitals reviewed.  Constitutional:      Appearance: He is well-developed.  HENT:     Head: Normocephalic and atraumatic.  Eyes:     General:        Right eye: No discharge.        Left eye: No discharge.  Cardiovascular:     Rate and Rhythm: Normal rate and regular rhythm.     Heart sounds: Normal heart sounds, S1 normal and S2 normal. Heart sounds not distant. No midsystolic click and no opening snap. No murmur heard.    No friction rub.  Pulmonary:     Effort: Pulmonary effort is normal. No respiratory distress.     Breath sounds: Normal breath sounds. No decreased breath sounds, wheezing, rhonchi or rales.  Chest:     Chest wall: No tenderness.  Musculoskeletal:     Cervical back: Normal range of motion.     Right lower leg: No edema.     Left lower leg: No edema.  Skin:    General: Skin  is warm and dry.     Nails: There is no clubbing.  Neurological:     Mental Status: He is alert and oriented to person, place, and time.  Psychiatric:        Speech: Speech normal.        Behavior: Behavior normal.        Thought Content: Thought content normal.        Judgment: Judgment normal.     Wt Readings from Last 3 Encounters:  11/06/23 181 lb (82.1 kg)  09/30/23 185 lb (83.9 kg)  07/04/23 181 lb (82.1 kg)     ASSESSMENT & PLAN:   CAD involving the native coronaries with exertional dyspnea and fatigue: He is currently without symptoms of angina.  He reports an approximate 47-month history of exertional fatigue and dyspnea without frank chest pain.  Schedule Lexiscan MPI to evaluate for high risk ischemia as well as echo.  He will otherwise continue aspirin 81 mg, amlodipine 2.5 mg, clopidogrel 75 mg, ezetimibe 10 mg, and rosuvastatin 40 mg.  Chronic combined systolic and diastolic CHF: Appears euvolemic and well compensated.  He has had an intermittent cardiomyopathy with fluctuating EF from 45% to 60% dating back to 2017.  With exertional dyspnea pursue Lexiscan MPI to evaluate for high risk ischemia as outlined above as well as echo to evaluate for progressive cardiomyopathy.  He has also been diagnosed with bilateral carpal tunnel syndrome and is status post release on the left upper extremity.  Anticipate cardiac MRI and follow-up in to evaluate for infiltrative process.  For now, he remains on carvedilol 3.125 mg twice daily Entresto 49/51 mg twice daily and spironolactone 25 mg daily.  Not requiring standing loop diuretic.  Pending echo results would like to further escalate GDMT with discontinuation of amlodipine and escalation of evidence-based pharmacotherapy.  Lone A-fib: Noted following MI and VF arrest.  Subsequent outpatient cardiac monitoring showed no further evidence of atrial arrhythmia.  Not on anticoagulation given isolated episode in the context of significant acute  illness.  Should recurrence of A-fib he identified, OAC will need to be revisited.  HTN: Blood pressure is mildly elevated in the office today, though he has a history of orthostasis.  For now, continue pharmacotherapy as outlined above.  HLD: LDL 91 in 04/2023 Target LDL less than 70.  He reports adherence to rosuvastatin 40 mg and ezetimibe 10 mg.  Following the above workup anticipate escalation of lipid-lowering therapy as indicated to achieve target LDL less than 70.  Will need to consider PCSK9 inhibitor.     Disposition: F/u with Dr. Alvenia Aus or an APP in 6 weeks.   Medication Adjustments/Labs and Tests Ordered: Current medicines are reviewed at length with the patient today.  Concerns regarding medicines are outlined above. Medication changes, Labs and Tests ordered today are summarized above and listed in the Patient Instructions accessible in Encounters.   Signed, Varney Gentleman, PA-C 11/06/2023 12:56 PM     [facility] [place] [address] [place], [place] [address] [phone]

## 2023-11-06 ENCOUNTER — Ambulatory Visit: Attending: Physician Assistant | Admitting: Physician Assistant

## 2023-11-06 ENCOUNTER — Encounter: Payer: Self-pay | Admitting: Physician Assistant

## 2023-11-06 VITALS — BP 140/92 | HR 55 | Wt 181.0 lb

## 2023-11-06 DIAGNOSIS — I25118 Atherosclerotic heart disease of native coronary artery with other forms of angina pectoris: Secondary | ICD-10-CM | POA: Diagnosis not present

## 2023-11-06 DIAGNOSIS — I4891 Unspecified atrial fibrillation: Secondary | ICD-10-CM | POA: Diagnosis not present

## 2023-11-06 DIAGNOSIS — R0609 Other forms of dyspnea: Secondary | ICD-10-CM

## 2023-11-06 DIAGNOSIS — I5042 Chronic combined systolic (congestive) and diastolic (congestive) heart failure: Secondary | ICD-10-CM

## 2023-11-06 DIAGNOSIS — I10 Essential (primary) hypertension: Secondary | ICD-10-CM

## 2023-11-06 DIAGNOSIS — E785 Hyperlipidemia, unspecified: Secondary | ICD-10-CM

## 2023-11-06 NOTE — Patient Instructions (Signed)
 Medication Instructions:  Your Physician recommend you continue on your current medication as directed.    *If you need a refill on your cardiac medications before your next appointment, please call your pharmacy*  Lab Work: None ordered at this time  If you have labs (blood work) drawn today and your tests are completely normal, you will receive your results only by: MyChart Message (if you have MyChart) OR A paper copy in the mail If you have any lab test that is abnormal or we need to change your treatment, we will call you to review the results.  Testing/Procedures: Your physician has requested that you have an echocardiogram. Echocardiography is a painless test that uses sound waves to create images of your heart. It provides your doctor with information about the size and shape of your heart and how well your heart's chambers and valves are working.   You may receive an ultrasound enhancing agent through an IV if needed to better visualize your heart during the echo. This procedure takes approximately one hour.  There are no restrictions for this procedure.  This will take place at [address] [facility]) [address], [place] [address]  Please note: We ask at that you not bring children with you during ultrasound (echo/ vascular) testing. Due to room size and safety concerns, children are not allowed in the ultrasound rooms during exams. Our front office staff cannot provide observation of children in our lobby area while testing is being conducted. An adult accompanying a patient to their appointment will only be allowed in the ultrasound room at the discretion of the ultrasound technician under special circumstances. We apologize for any inconvenience.  Your provider has ordered a Lexiscan/ Exercise Myoview Stress test. This will take place at [facility]. Please report to the [facility] medical mall entrance. The volunteers at the first desk will direct you where to go.  [facility]  MYOVIEW  Your provider has ordered a Stress Test with nuclear imaging. The purpose of this test is to evaluate the blood supply to your heart muscle. This procedure is referred to as a "Non-Invasive Stress Test." This is because other than having an IV started in your vein, nothing is inserted or "invades" your body. Cardiac stress tests are done to find areas of poor blood flow to the heart by determining the extent of coronary artery disease (CAD). Some patients exercise on a treadmill, which naturally increases the blood flow to your heart, while others who are unable to walk on a treadmill due to physical limitations will have a pharmacologic/chemical stress agent called Lexiscan . This medicine will mimic walking on a treadmill by temporarily increasing your coronary blood flow.   Please note: these test may take anywhere between 2-4 hours to complete  How to prepare for your Myoview test:  Nothing to eat for 6 hours prior to the test No caffeine for 24 hours prior to test No smoking 24 hours prior to test. Your medication may be taken with water.  If your doctor stopped a medication because of this test, do not take that medication. Ladies, please do not wear dresses.  Skirts or pants are appropriate. Please wear a short sleeve shirt. No perfume, cologne or lotion. Wear comfortable walking shoes. No heels!   PLEASE NOTIFY THE OFFICE AT LEAST 24 HOURS IN ADVANCE IF YOU ARE UNABLE TO KEEP YOUR APPOINTMENT.  [phone] AND  PLEASE NOTIFY NUCLEAR MEDICINE AT [facility] AT LEAST 24 HOURS IN ADVANCE IF YOU ARE UNABLE  TO KEEP YOUR APPOINTMENT. [phone]   Follow-Up: At [facility], you and your health needs are our priority.  As part of our continuing mission to provide you with exceptional heart care, our providers are all part of one team.  This team includes your primary Cardiologist (physician) and Advanced Practice Providers or APPs (Physician Assistants and Nurse Practitioners)  who all work together to provide you with the care you need, when you need it.  Your next appointment:   6-8 week(s)  Provider:   You may see Antionette Kirks, MD or Varney Gentleman, PA-C

## 2023-11-07 ENCOUNTER — Ambulatory Visit: Admitting: Physician Assistant

## 2023-11-07 ENCOUNTER — Other Ambulatory Visit: Payer: Self-pay | Admitting: Surgery

## 2023-11-13 ENCOUNTER — Other Ambulatory Visit

## 2023-11-15 ENCOUNTER — Ambulatory Visit
Admission: RE | Admit: 2023-11-15 | Discharge: 2023-11-15 | Disposition: A | Discharge status: Home / Self Care | Source: Ambulatory Visit | Attending: Physician Assistant | Admitting: Physician Assistant

## 2023-11-15 DIAGNOSIS — R0609 Other forms of dyspnea: Secondary | ICD-10-CM

## 2023-11-15 LAB — NM MYOCAR MULTI W/SPECT W/WALL MOTION / EF
LV dias vol: 103 mL (ref 62–150)
LV sys vol: 52 mL
MPHR: 150 {beats}/min
Nuc Stress EF: 50 %
Peak HR: 98 {beats}/min
Percent HR: 65 %
Rest HR: 55 {beats}/min
Rest Nuclear Isotope Dose: 10.1 mCi
SDS: 1
SRS: 2
SSS: 0
ST Depression (mm): 0 mm
Stress Nuclear Isotope Dose: 31.9 mCi
TID: 1.01

## 2023-11-15 MED ORDER — TECHNETIUM TC 99M TETROFOSMIN IV KIT
31.8900 | PACK | Freq: Once | INTRAVENOUS | Status: AC | PRN
  Administered 2023-11-15: 31.89 via INTRAVENOUS

## 2023-11-15 MED ORDER — TECHNETIUM TC 99M TETROFOSMIN IV KIT
10.0800 | PACK | Freq: Once | INTRAVENOUS | Status: AC | PRN
  Administered 2023-11-15: 10.08 via INTRAVENOUS

## 2023-11-15 MED ORDER — REGADENOSON 0.4 MG/5ML IV SOLN
0.4000 mg | Freq: Once | INTRAVENOUS | Status: AC
  Administered 2023-11-15: 0.4 mg via INTRAVENOUS

## 2023-11-20 ENCOUNTER — Ambulatory Visit: Admit: 2023-11-20 | Admitting: Surgery

## 2023-11-20 SURGERY — RELEASE, CARPAL TUNNEL, ENDOSCOPIC
Anesthesia: Choice | Site: Wrist | Laterality: Right

## 2023-11-25 ENCOUNTER — Other Ambulatory Visit: Payer: Self-pay | Admitting: Cardiovascular Disease

## 2023-11-27 ENCOUNTER — Other Ambulatory Visit: Payer: Self-pay | Admitting: Internal Medicine

## 2023-11-27 DIAGNOSIS — E782 Mixed hyperlipidemia: Secondary | ICD-10-CM

## 2023-11-29 NOTE — Telephone Encounter (Signed)
 Med refill

## 2023-11-29 NOTE — Telephone Encounter (Signed)
 Requested medications are due for refill today.  unsure  Requested medications are on the active medications list.  yes  Last refill. 07/04/2023  Future visit scheduled.   yes  Notes to clinic.  Rx signed by Dr. Daun Epstein.    Requested Prescriptions  Pending Prescriptions Disp Refills   rosuvastatin  (CRESTOR ) 40 MG tablet [Pharmacy Med Name: Rosuvastatin  Calcium  Oral Tablet 40 MG] 90 tablet 3    Sig: TAKE 1 TABLET EVERY DAY     Cardiovascular:  Antilipid - Statins 2 Failed - 11/29/2023  9:23 AM      Failed - Valid encounter within last 12 months    Recent Outpatient Visits           2 months ago Hearing loss of right ear due to cerumen impaction   [facility], [place], MD       Future Appointments             In 2 weeks Lawerence Pressman, MD [facility] [place]   In 1 month Dunn, Elvia Hammans, PA-C  [facility] at [place]   In 5 months Sheron Dixons, MD [facility] at [facility]            Failed - Lipid Panel in normal range within the last 12 months    Cholesterol, Total  Date Value Ref Range Status  04/24/2023 168 100 - 199 mg/dL Final   LDL Chol Calc (NIH)  Date Value Ref Range Status  04/24/2023 91 0 - 99 mg/dL Final   LDL Direct  Date Value Ref Range Status  09/24/2017 116 (H) 0 - 99 mg/dL Final   HDL  Date Value Ref Range Status  04/24/2023 62 >39 mg/dL Final   Triglycerides  Date Value Ref Range Status  04/24/2023 81 0 - 149 mg/dL Final         Passed - Cr in normal range and within 360 days    Creatinine, Ser  Date Value Ref Range Status  04/24/2023 1.22 0.76 - 1.27 mg/dL Final         Passed - Patient is not pregnant

## 2023-12-02 ENCOUNTER — Encounter: Payer: Self-pay | Admitting: Internal Medicine

## 2023-12-02 ENCOUNTER — Ambulatory Visit (INDEPENDENT_AMBULATORY_CARE_PROVIDER_SITE_OTHER): Admitting: Internal Medicine

## 2023-12-02 VITALS — BP 110/60 | HR 73 | Ht 67.0 in | Wt 183.1 lb

## 2023-12-02 DIAGNOSIS — I10 Essential (primary) hypertension: Secondary | ICD-10-CM

## 2023-12-02 DIAGNOSIS — R7303 Prediabetes: Secondary | ICD-10-CM | POA: Diagnosis not present

## 2023-12-02 DIAGNOSIS — R319 Hematuria, unspecified: Secondary | ICD-10-CM | POA: Diagnosis not present

## 2023-12-02 DIAGNOSIS — E782 Mixed hyperlipidemia: Secondary | ICD-10-CM | POA: Diagnosis not present

## 2023-12-02 LAB — POCT URINALYSIS DIPSTICK
Bilirubin, UA: NEGATIVE
Glucose, UA: NEGATIVE
Ketones, UA: NEGATIVE
Leukocytes, UA: NEGATIVE
Nitrite, UA: NEGATIVE
Protein, UA: NEGATIVE
Spec Grav, UA: 1.02 (ref 1.010–1.025)
Urobilinogen, UA: 0.2 U/dL
pH, UA: 6 (ref 5.0–8.0)

## 2023-12-02 MED ORDER — SULFAMETHOXAZOLE-TRIMETHOPRIM 800-160 MG PO TABS
1.0000 | ORAL_TABLET | Freq: Two times a day (BID) | ORAL | 0 refills | Status: AC
Start: 2023-12-02 — End: 2023-12-16

## 2023-12-02 NOTE — Assessment & Plan Note (Signed)
 Managed with diet only. Reminded him to avoid sweets and cut back on bread/pasta/etc Consider A1C next visit

## 2023-12-02 NOTE — Assessment & Plan Note (Signed)
 Blood pressure is well controlled.  Current medications spironolactone , Coreg , amlodipine  and Entresto . Will continue same regimen along with efforts to limit dietary sodium.

## 2023-12-02 NOTE — Progress Notes (Signed)
 Date:  12/02/2023   Name:  Todd Weaver   DOB:  11-08-1950   MRN:  [id number]   Chief Complaint: Hypertension and Prediabetes  Hypertension This is a chronic problem. The problem is controlled. Pertinent negatives include no chest pain, palpitations or shortness of breath. Past treatments include diuretics, beta blockers, calcium  channel blockers and angiotensin blockers. The current treatment provides significant improvement.  Hyperlipidemia This is a chronic problem. Recent lipid tests were reviewed and are high. Pertinent negatives include no chest pain or shortness of breath. Current antihyperlipidemic treatment includes statins and ezetimibe . The current treatment provides significant improvement of lipids.  Hematuria This is a new problem. The current episode started 1 to 4 weeks ago. The problem has been gradually improving since onset. He describes the hematuria as gross hematuria. The hematuria occurs throughout his entire urinary stream. He reports no clotting in his urine stream. The pain is mild. He describes his urine color as light pink. Irritative symptoms do not include frequency, nocturia or urgency. Pertinent negatives include no fever. His past medical history is significant for hypertension.    Review of Systems  Constitutional:  Negative for diaphoresis, fatigue and fever.  Respiratory:  Negative for chest tightness and shortness of breath.   Cardiovascular:  Negative for chest pain and palpitations.  Genitourinary:  Positive for hematuria. Negative for frequency, nocturia and urgency.  Musculoskeletal:  Positive for joint swelling (right hand and palm pain - surgery planned).  Psychiatric/Behavioral:  Positive for decreased concentration (since increasing the dose of gabapentin ). Negative for dysphoric mood and sleep disturbance. The patient is not nervous/anxious.      Lab Results  Component Value Date   NA 140 04/24/2023   K 4.4 04/24/2023   CO2 22 04/24/2023    GLUCOSE 106 (H) 04/24/2023   BUN 12 04/24/2023   CREATININE 1.22 04/24/2023   CALCIUM  9.6 04/24/2023   EGFR 64 04/24/2023   GFRNONAA >60 08/15/2022   Lab Results  Component Value Date   CHOL 168 04/24/2023   HDL 62 04/24/2023   LDLCALC 91 04/24/2023   LDLDIRECT 116 (H) 09/24/2017   TRIG 81 04/24/2023   CHOLHDL 2.7 04/24/2023   Lab Results  Component Value Date   TSH 2.390 11/07/2021   Lab Results  Component Value Date   HGBA1C 6.1 (H) 04/24/2023   Lab Results  Component Value Date   WBC 5.8 04/24/2023   HGB 14.1 04/24/2023   HCT 44.2 04/24/2023   MCV 90 04/24/2023   PLT 251 04/24/2023   Lab Results  Component Value Date   ALT 41 04/24/2023   AST 37 04/24/2023   ALKPHOS 50 04/24/2023   BILITOT 1.3 (H) 04/24/2023   No results found for: "25OHVITD2", "25OHVITD3", "VD25OH"   Patient Active Problem List   Diagnosis Date Noted   Hematuria 12/02/2023   Carpal tunnel syndrome, bilateral 05/22/2023   Prediabetes 04/24/2023   Subareolar gynecomastia in male 02/01/2023   Myelopathy (HCC) 08/22/2022   Cervical spinal stenosis 08/22/2022   Myelomalacia (HCC) 08/22/2022   Cervical myelopathy (HCC) 08/22/2022   Primary insomnia 06/18/2022   Chronic deep vein thrombosis (DVT) of brachial vein of left upper extremity (HCC) 12/28/2021   Contrast media allergy 12/12/2021   GERD (gastroesophageal reflux disease) 12/12/2021   Ischemic cardiomyopathy 12/12/2021   Urinary hesitancy 12/26/2020   Carpal tunnel syndrome on both sides 12/26/2020   Chronic systolic congestive heart failure (HCC) 09/29/2020   Thrombophilia (HCC) 09/29/2020   PAF (paroxysmal atrial  fibrillation) (HCC) 04/11/2020   PAD (peripheral artery disease) (HCC) 03/31/2019   Rectal polyp    Impingement syndrome of left shoulder region 10/14/2018   Coronary artery disease of native artery of native heart with stable angina pectoris (HCC) 10/03/2018   Essential hypertension 06/19/2017   Mixed hyperlipidemia  06/19/2017   Overweight (BMI 25.0-29.9) 06/19/2017   BPH (benign prostatic hyperplasia) 06/19/2017   ED (erectile dysfunction) 06/19/2017   MCI (mild cognitive impairment) 06/19/2017   Lumbar spondylosis with myelopathy 11/08/2016   Hx of transient ischemic attack (TIA) 2011    Allergies  Allergen Reactions   Codeine Hives and Other (See Comments)   Shellfish Allergy Hives and Swelling   Contrast Media [Iodinated Contrast Media] Itching and Other (See Comments)    Whelts on tongue   Fish Allergy Itching    Past Surgical History:  Procedure Laterality Date   ANAL FISTULECTOMY N/A 12/21/2014   Procedure: FISTULECTOMY ANAL;  Surgeon: Jerlean Mood, MD;  Location: [facility] ORS;  Service: General;  Laterality: N/A;   ANTERIOR CERVICAL DECOMP/DISCECTOMY FUSION N/A 08/22/2022   Procedure: C4-6 ANTERIOR CERVICAL DISCECTOMY AND FUSION (NUVASIVE ACP, GLOBUS HEDRON);  Surgeon: Jodeen Munch, MD;  Location: [facility] ORS;  Service: Neurosurgery;  Laterality: N/A;   APPENDECTOMY  1975   CARDIAC CATHETERIZATION     CARPAL TUNNEL RELEASE Left 07/04/2023   Procedure: CARPAL TUNNEL RELEASE ENDOSCOPIC;  Surgeon: Elner Hahn, MD;  Location: [facility] ORS;  Service: Orthopedics;  Laterality: Left;   COLONOSCOPY WITH PROPOFOL  N/A 02/12/2019   Procedure: COLONOSCOPY WITH PROPOFOL ;  Surgeon: Marnee Sink, MD;  Location: [facility] ENDOSCOPY;  Service: Endoscopy;  Laterality: N/A;   CORONARY STENT INTERVENTION N/A 09/09/2017   Procedure: CORONARY STENT INTERVENTION;  Surgeon: Wenona Hamilton, MD;  Location: [facility] INVASIVE CV LAB;  Service: Cardiovascular;  Laterality: N/A;   CORONARY ULTRASOUND/IVUS N/A 09/09/2017   Procedure: Intravascular Ultrasound/IVUS;  Surgeon: Wenona Hamilton, MD;  Location: [facility] INVASIVE CV LAB;  Service: Cardiovascular;  Laterality: N/A;   CORONARY/GRAFT ACUTE MI REVASCULARIZATION N/A 07/18/2017   Procedure: Coronary/Graft Acute MI Revascularization;  Surgeon: Wenona Hamilton, MD;   Location: [facility] INVASIVE CV LAB;  Service: Cardiovascular;  Laterality: N/A;   HEMORRHOID SURGERY N/A 12/21/2014   Procedure: HEMORRHOIDECTOMY;  Surgeon: Jerlean Mood, MD;  Location: [facility] ORS;  Service: General;  Laterality: N/A;   KNEE SURGERY Right age 75   LEFT HEART CATH AND CORONARY ANGIOGRAPHY N/A 07/18/2017   Procedure: LEFT HEART CATH AND CORONARY ANGIOGRAPHY;  Surgeon: Wenona Hamilton, MD;  Location: [facility] CV LAB;  Service: Cardiovascular;  Laterality: N/A;   LEFT HEART CATH AND CORONARY ANGIOGRAPHY Left 08/19/2017   Procedure: LEFT HEART CATH AND CORONARY ANGIOGRAPHY;  Surgeon: Wenona Hamilton, MD;  Location: [facility];  Service: Cardiovascular;  Laterality: Left;   LEFT HEART CATH AND CORONARY ANGIOGRAPHY N/A 11/07/2017   Procedure: LEFT HEART CATH AND CORONARY ANGIOGRAPHY;  Surgeon: Wenona Hamilton, MD;  Location: [facility];  Service: Cardiovascular;  Laterality: N/A;   LEFT HEART CATH AND CORONARY ANGIOGRAPHY N/A 10/17/2021   Procedure: LEFT HEART CATH AND CORONARY ANGIOGRAPHY;  Surgeon: Wenona Hamilton, MD;  Location: [facility] INVASIVE CV LAB;  Service: Cardiovascular;  Laterality: N/A;   ROTATOR CUFF REPAIR Right 2013    Social History   Tobacco Use   Smoking status: Former    Current packs/day: 0.00    Types: Cigarettes    Quit date: 07/24/2007    Years since  quitting: 16.3    Passive exposure: Past   Smokeless tobacco: Former    Types: Chew   Tobacco comments:    smoking cessation materials not required  Vaping Use   Vaping status: Never Used  Substance Use Topics   Alcohol use: Yes    Alcohol/week: 5.0 - 6.0 standard drinks of alcohol    Types: 5 - 6 Cans of beer per week    Comment:  weekly   Drug use: No     Medication list has been reviewed and updated.  Current Meds  Medication Sig   amLODipine  (NORVASC ) 2.5 MG tablet Take 2.5 mg by mouth every morning.   aspirin  EC 81 MG tablet Take 81 mg by mouth daily.     carvedilol  (COREG ) 3.125 MG tablet TAKE 1 TABLET TWICE DAILY WITH MEALS   clopidogrel  (PLAVIX ) 75 MG tablet Take 75 mg by mouth daily.   ezetimibe  (ZETIA ) 10 MG tablet Take 1 tablet (10 mg total) by mouth every morning.   gabapentin  (NEURONTIN ) 300 MG capsule Take 1 capsule (300 mg total) by mouth every morning.   nitroGLYCERIN  (NITROSTAT ) 0.4 MG SL tablet Place 1 tablet (0.4 mg total) under the tongue every 5 (five) minutes as needed for chest pain.   oxybutynin  (DITROPAN -XL) 10 MG 24 hr tablet TAKE 1 TABLET AT BEDTIME   rosuvastatin  (CRESTOR ) 40 MG tablet TAKE 1 TABLET EVERY DAY   sacubitril -valsartan  (ENTRESTO ) 49-51 MG Take 1 tablet by mouth 2 (two) times daily.   sildenafil  (REVATIO ) 20 MG tablet Take 20 mg by mouth 3 (three) times daily.   spironolactone  (ALDACTONE ) 25 MG tablet Take 25 mg by mouth daily.   sulfamethoxazole -trimethoprim  (BACTRIM  DS) 800-160 MG tablet Take 1 tablet by mouth 2 (two) times daily for 14 days.   tamsulosin  (FLOMAX ) 0.4 MG CAPS capsule TAKE 1 CAPSULE EVERY DAY       12/02/2023    3:01 PM 09/30/2023    8:14 AM 04/24/2023    9:43 AM 01/09/2023   10:38 AM  GAD 7 : Generalized Anxiety Score  Nervous, Anxious, on Edge 1 1 0 0  Control/stop worrying 0 0 0 0  Worry too much - different things 0 0 0 0  Trouble relaxing 1 2 1  0  Restless 1 2 1  0  Easily annoyed or irritable 1 1 0 0  Afraid - awful might happen 0 0 0 0  Total GAD 7 Score 4 6 2  0  Anxiety Difficulty Not difficult at all  Not difficult at all Not difficult at all       12/02/2023    3:01 PM 09/30/2023    8:14 AM 04/24/2023    9:43 AM  Depression screen PHQ 2/9  Decreased Interest 1 1 0  Down, Depressed, Hopeless 0 0 0  PHQ - 2 Score 1 1 0  Altered sleeping 2 2 0  Tired, decreased energy 2 1 1   Change in appetite 1 0 0  Feeling bad or failure about yourself  0 0 0  Trouble concentrating 0 0 1  Moving slowly or fidgety/restless 0 0 1  Suicidal thoughts 0 0 0  PHQ-9 Score 6 4 3   Difficult  doing work/chores Not difficult at all  Not difficult at all    BP Readings from Last 3 Encounters:  12/02/23 110/60  11/06/23 (!) 140/92  09/30/23 128/78    Lab Results  Component Value Date   COLORU yellow 12/02/2023   CLARITYU cloudy 12/02/2023   GLUCOSEUR  Negative 12/02/2023   BILIRUBINUR negative 12/02/2023   KETONESU negative 12/02/2023   SPECGRAV 1.020 12/02/2023   RBCUR large (A) 12/02/2023   PHUR 6.0 12/02/2023   PROTEINUR Negative 12/02/2023   UROBILINOGEN 0.2 12/02/2023   LEUKOCYTESUR Negative 12/02/2023   Physical Exam Vitals and nursing note reviewed.  Constitutional:      General: He is not in acute distress.    Appearance: He is well-developed.  HENT:     Head: Normocephalic and atraumatic.  Neck:     Vascular: No carotid bruit.  Cardiovascular:     Rate and Rhythm: Normal rate and regular rhythm.     Heart sounds: No murmur heard. Pulmonary:     Effort: Pulmonary effort is normal. No respiratory distress.     Breath sounds: No wheezing or rhonchi.  Abdominal:     Palpations: Abdomen is soft.     Tenderness: There is no abdominal tenderness. There is no right CVA tenderness or left CVA tenderness.  Musculoskeletal:        General: Normal range of motion.     Cervical back: Normal range of motion.     Right lower leg: No edema.     Left lower leg: No edema.  Lymphadenopathy:     Cervical: No cervical adenopathy.  Skin:    General: Skin is warm and dry.     Findings: No rash.  Neurological:     Mental Status: He is alert and oriented to person, place, and time.  Psychiatric:        Behavior: Behavior normal.        Thought Content: Thought content normal.        Wt Readings from Last 3 Encounters:  12/02/23 183 lb 2 oz (83.1 kg)  11/06/23 181 lb (82.1 kg)  09/30/23 185 lb (83.9 kg)    BP 110/60   Pulse 73   Ht 5\' 7"  (1.702 m)   Wt 183 lb 2 oz (83.1 kg)   SpO2 96%   BMI 28.68 kg/m   Assessment and Plan:  Problem List Items  Addressed This Visit       Unprioritized   Essential hypertension - Primary (Chronic)   Blood pressure is well controlled.  Current medications spironolactone , Coreg , amlodipine  and Entresto . Will continue same regimen along with efforts to limit dietary sodium.       Mixed hyperlipidemia (Chronic)   LDL is  Lab Results  Component Value Date   LDLCALC 91 04/24/2023   Current regimen is zetia  and Crestor .  No medication side effects noted. Goal LDL is <55.       Prediabetes   Managed with diet only. Reminded him to avoid sweets and cut back on bread/pasta/etc Consider A1C next visit      Hematuria   New onset of essentially painless blood in urine. It is slowly improving - no fever or chills noted UA + blood only Will treat for possible infection/prostatitis with 2 weeks of Bactrim  Recheck OV and UA in 18 days      Relevant Medications   sulfamethoxazole -trimethoprim  (BACTRIM  DS) 800-160 MG tablet   Other Relevant Orders   POCT urinalysis dipstick (Completed)    Return in about 18 days (around 12/20/2023) for hematuria.    Sheron Dixons, MD [facility] [place]

## 2023-12-02 NOTE — Assessment & Plan Note (Signed)
 New onset of essentially painless blood in urine. It is slowly improving - no fever or chills noted UA + blood only Will treat for possible infection/prostatitis with 2 weeks of Bactrim  Recheck OV and UA in 18 days

## 2023-12-02 NOTE — Assessment & Plan Note (Signed)
 LDL is  Lab Results  Component Value Date   LDLCALC 91 04/24/2023   Current regimen is zetia  and Crestor .  No medication side effects noted. Goal LDL is <55.

## 2023-12-18 ENCOUNTER — Ambulatory Visit: Attending: Physician Assistant

## 2023-12-18 ENCOUNTER — Ambulatory Visit (INDEPENDENT_AMBULATORY_CARE_PROVIDER_SITE_OTHER): Payer: Self-pay | Admitting: Urology

## 2023-12-18 ENCOUNTER — Ambulatory Visit: Payer: Self-pay | Admitting: Physician Assistant

## 2023-12-18 VITALS — BP 124/70 | HR 86 | Ht 67.0 in | Wt 180.8 lb

## 2023-12-18 DIAGNOSIS — N3281 Overactive bladder: Secondary | ICD-10-CM

## 2023-12-18 DIAGNOSIS — N401 Enlarged prostate with lower urinary tract symptoms: Secondary | ICD-10-CM

## 2023-12-18 DIAGNOSIS — R0609 Other forms of dyspnea: Secondary | ICD-10-CM

## 2023-12-18 DIAGNOSIS — R310 Gross hematuria: Secondary | ICD-10-CM

## 2023-12-18 DIAGNOSIS — R351 Nocturia: Secondary | ICD-10-CM

## 2023-12-18 DIAGNOSIS — N138 Other obstructive and reflux uropathy: Secondary | ICD-10-CM

## 2023-12-18 LAB — ECHOCARDIOGRAM COMPLETE
AR max vel: 1.46 cm2
AV Area VTI: 1.41 cm2
AV Area mean vel: 1.42 cm2
AV Mean grad: 5 mmHg
AV Peak grad: 9.6 mmHg
Ao pk vel: 1.55 m/s
Area-P 1/2: 3.6 cm2
Height: 67 in
S' Lateral: 3.29 cm
Weight: 2892.8 [oz_av]

## 2023-12-18 LAB — BLADDER SCAN AMB NON-IMAGING

## 2023-12-18 NOTE — Progress Notes (Signed)
   12/18/2023 9:14 AM   Todd Weaver [birth year] [id number]  Reason for visit: Follow up BPH/OAB, nocturia, PSA screening, recent hematuria  HPI: 73 year old male with extensive cardiac history who remains on Plavix  who I followed since 2022 for the above issues.  Urinary symptoms currently well-managed on Flomax  and oxybutynin , primary issue is nocturia 2-3 times at night, but he continues to drink 2-3 beers in the evening.  He snores and I have previously recommended sleep apnea evaluation but this has never been completed.  PVRs have always been normal, including normal at 41ml today.  I offered cystoscopy/TRUS for further evaluation and consideration of outlet procedures but he deferred.  PSA has remained stable, most recently 1.8 from October 2024, stable over the last few years.  He was seen by his PCP on 12/02/2023 with mild dysuria and dark-colored urine with possible blood.  Dipstick urinalysis showed blood but no other evidence of infection, he was treated with a 2-week course of Bactrim .  He reports his symptoms have resolved after starting antibiotics and denies any complaints today.  We discussed the importance of repeating a urinalysis in 4 weeks to confirm resolution of any microscopic hematuria that would warrant further workup with CT urogram and cystoscopy.  Flomax  and oxybutynin  refilled Repeat urinalysis in 4 weeks to confirm resolution of hematuria after course of antibiotics RTC 1 year PVR  Lawerence Pressman, MD  [facility] [address], [address] [place], [place] [address] [phone]

## 2023-12-18 NOTE — Patient Instructions (Addendum)
 Nocturia refers to the need to wake up during the night to urinate, which can disrupt your sleep and impact your overall well-being. Fortunately, there are several strategies you can employ to help prevent or manage nocturia. It's important to consult with your healthcare provider before making any significant changes to your routine. Here are some helpful strategies to consider:  Limit Fluid Intake Before Bed: Avoid drinking large amounts of fluids in the evening, especially within a few hours of bedtime. Consume most of your daily fluid intake earlier in the day to reduce the need to urinate at night.  Monitor Your Diet: Limit your intake of caffeine and alcohol, as these substances can increase urine production and irritate the bladder.  Avoid diet, "zero calorie," and artificially sweetened drinks, especially sodas, in the afternoon or evening. Be mindful of consuming foods and drinks with high water content before bedtime, such as watermelon and herbal teas.  Time Your Medications: If you're taking medications that contribute to increased urination, consult your healthcare provider about adjusting the timing of these medications to minimize their impact during the night.  Practice Double Voiding: Before going to bed, make an effort to empty your bladder twice within a short period. This can help reduce the amount of urine left in your bladder before sleep.  Bladder Training: Gradually increase the time between bathroom visits during the day to train your bladder to hold larger volumes of urine. Over time, this can help reduce the frequency of nighttime awakenings to urinate.  Elevate Your Legs During the Day: Elevating your legs during the day can help minimize fluid retention in your lower extremities, which might reduce nighttime urination.  Pelvic Floor Exercises: Strengthening your pelvic floor muscles through Kegel exercises can help improve bladder control and potentially reduce  the urge to urinate at night.  Create a Relaxing Bedtime Routine: Stress and anxiety can exacerbate nocturia. Engage in calming activities before bed, such as reading, listening to soothing music, or practicing relaxation techniques.  Stay Active: Engage in regular physical activity, but avoid intense exercise close to bedtime, as this can increase your body's demand for fluids.  Maintain a Healthy Weight: Excess weight can compress the bladder and contribute to bladder and urinary issues. Aim to achieve and maintain a healthy weight through a balanced diet and regular exercise.  Remember that every individual is unique, and the effectiveness of these strategies may vary. It's important to work with your healthcare provider to develop a plan that suits your specific needs and addresses any underlying causes of nocturia.     Prostate Cancer Screening  Prostate cancer screening is testing that is done to check for the presence of prostate cancer in men. The prostate gland is a walnut-sized gland that is located below the bladder and in front of the rectum in males. The function of the prostate is to add fluid to semen during ejaculation. Prostate cancer is one of the most common types of cancer in men. Who should have prostate cancer screening? Screening recommendations vary based on age and other risk factors, as well as between the professional organizations who make the recommendations. In general, screening is recommended if: You are age 71 to 66 and have an average risk for prostate cancer. You should talk with your health care provider about your need for screening and how often screening should be done. Because most prostate cancers are slow growing and will not cause death, screening in this age group is generally reserved for men  who have a 10- to 15-year life expectancy. You are younger than age 71, and you have these risk factors: Having a father, brother, or uncle who has been  diagnosed with prostate cancer. The risk is higher if your family member's cancer occurred at an early age or if you have multiple family members with prostate cancer at an early age. Being a male who is [place] or is of [place] or sub-Saharan African descent. In general, screening is not recommended if: You are younger than age 18. You are between the ages of 20 and 22 and you have no risk factors. You are 71 years of age or older. At this age, the risks that screening can cause are greater than the benefits that it may provide. If you are at high risk for prostate cancer, your health care provider may recommend that you have screenings more often or that you start screening at a younger age. How is screening for prostate cancer done? The recommended prostate cancer screening test is a blood test called the prostate-specific antigen (PSA) test. PSA is a protein that is made in the prostate. As you age, your prostate naturally produces more PSA. Abnormally high PSA levels may be caused by: Prostate cancer. An enlarged prostate that is not caused by cancer (benign prostatic hyperplasia, or BPH). This condition is very common in older men. A prostate gland infection (prostatitis) or urinary tract infection. Certain medicines such as male hormones (like testosterone) or other medicines that raise testosterone levels. Depending on the PSA results, you may need more tests, such as: A physical exam to check the size of your prostate gland, if not done as part of screening. Blood and imaging tests. A procedure to remove tissue samples from your prostate gland for testing (biopsy). This is the only way to know for certain if you have prostate cancer. What are the benefits of prostate cancer screening? Screening can help to identify cancer at an early stage, before symptoms start and when the cancer can be treated more easily. There is a small chance that screening may lower your risk of dying from  prostate cancer. The chance is small because prostate cancer is a slow-growing cancer, and most men with prostate cancer die from a different cause. What are the risks of prostate cancer screening? The main risk of prostate cancer screening is diagnosing and treating prostate cancer that would never have caused any symptoms or problems. This is called overdiagnosisand overtreatment. PSA screening cannot tell you if your PSA is high due to cancer or a different cause. A prostate biopsy is the only procedure to diagnose prostate cancer. Even the results of a biopsy may not tell you if your cancer needs to be treated. Slow-growing prostate cancer may not need any treatment other than monitoring, so diagnosing and treating it may cause unnecessary stress or other side effects. Questions to ask your health care provider When should I start prostate cancer screening? What is my risk for prostate cancer? How often do I need screening? What type of screening tests do I need? How do I get my test results? What do my results mean? Do I need treatment? Where to find more information The American Cancer Society: www.cancer.org American Urological Association: www.auanet.org Contact a health care provider if: You have difficulty urinating. You have pain when you urinate or ejaculate. You have blood in your urine or semen. You have pain in your back or in the area of your prostate. Summary Prostate cancer is  a common type of cancer in men. The prostate gland is located below the bladder and in front of the rectum. This gland adds fluid to semen during ejaculation. Prostate cancer screening may identify cancer at an early stage, when the cancer can be treated more easily and is less likely to have spread to other areas of the body. The prostate-specific antigen (PSA) test is the recommended screening test for prostate cancer, but it has associated risks. Discuss the risks and benefits of prostate cancer  screening with your health care provider. If you are age 39 or older, the risks that screening can cause are greater than the benefits that it may provide. This information is not intended to replace advice given to you by your health care provider. Make sure you discuss any questions you have with your health care provider. Document Revised: 01/02/2021 Document Reviewed: 01/02/2021 Elsevier Patient Education  2024 [employer].

## 2023-12-20 ENCOUNTER — Ambulatory Visit: Admitting: Internal Medicine

## 2024-01-02 NOTE — Progress Notes (Signed)
 Cardiology Office Note    Date:  01/03/2024   ID:  Todd Weaver, DOB 03/05/1951, MRN [id number]  PCP:  Sheron Dixons, MD  Cardiologist:  Antionette Kirks, MD  Electrophysiologist:  None   Chief Complaint: Follow-up  History of Present Illness:   Todd Weaver is a 72 y.o. male with history of CAD with inferior ST elevation MI complicated by ventricular fibrillation arrest in 06/2017 status post PCI/DES with overlapping drug-eluting stent placement to the RCA and A-fib post procedure status post staged IVUS guided DES to the proximal and ostial LAD in 08/2017, chronic combined systolic and diastolic CHF, CVA, PAD followed by vascular surgery, HTN, HLD, prior tobacco use, bilateral carpal tunnel syndrome status post repair, and BPH who presents for follow-up of echo and Lexiscan  MPI.   He was admitted in 06/2017 with an inferior ST elevation MI complicated by ventricular fibrillation arrest.  He underwent successful PCI/DES with 2 overlapping drug-eluting stents to the RCA.  LHC at that time also demonstrated significant ostial LAD stenosis, borderline significant proximal LAD stenosis, and moderate distal LAD stenosis.  LVEF was mildly reduced.  Postprocedure, he had A-fib that was successfully treated with amiodarone .  He underwent staged IVUS guided PCI/DES to the ostial and proximal LAD in 08/2017.  Repeat LHC in 10/2017 showed widely patent LAD and RCA stents with no significant restenosis.  The ramus branch was pinched by the LAD stent with 70% ostial stenosis.  LVEF was 50 to 55%.  Nuclear stress test in 08/2019 showed no evidence of ischemia with normal ejection fraction.  Echo in 09/2019 showed an EF of 45 to 50% with mild mitral regurgitation.  Lexiscan  MPI on 01/18/2021 which was overall low risk and showed no evidence of ischemia.  LVEF appeared visually normal, though calculated value was mildly reduced at 47%.  In the setting of continued left arm and shoulder discomfort and exertional  fatigue he underwent LHC on 10/17/2021 that demonstrated widely patent RCA and LAD stents with no significant restenosis.  Stable 70% stenosis in the ostial ramus.  Otherwise, no obstructive disease.  Mildly reduced LV systolic function and mildly elevated LVEDP were noted.  Medical therapy was recommended.  Subsequent left upper extremity venous duplex demonstrated a chronic thrombus involving one of the left brachial veins.  Case was discussed with the patient's primary cardiologist with no intervention indicated.  Subsequent hypercoagulable workup was unrevealing.  Left upper extremity arterial ultrasound showed triphasic arterial flow throughout.  Echo on 11/28/2021 demonstrated an EF of 45 to 50%, global hypokinesis, grade 2 diastolic dysfunction, normal RV systolic function and ventricular cavity size, no significant valvular abnormalities, and an estimated right atrial pressure of 3 mmHg.  Limited echo in 03/2022 demonstrated an EF of 45 to 50%, global hypokinesis, grade 2 diastolic dysfunction, normal RV systolic function, and no significant valvular abnormalities.  Escalation of GDMT has had times been limited by orthostasis and constipation.  He was last seen in the office in 10/2023 noting an approximate 24-month history of exertional fatigue and shortness of breath without frank chest pain.  He did notice some left shoulder and arm discomfort when he was laying on his left side that improved with changing sleeping positions.  Lexiscan  MPI on 11/15/2023 showed no evidence of ischemia or infarction and was overall low risk.  Echo on 12/18/2023 showed an EF of 50 to 55%, no regional wall motion abnormalities, normal LV diastolic function parameters, normal RV systolic function and ventricular cavity size,  no significant valvular abnormalities, and an estimated right atrial pressure of 3 mmHg.  He comes in doing well from a cardiac perspective and is without symptoms of angina or cardiac decompensation.  No  further chest discomfort.  No dyspnea, potation's, dizziness, presyncope, or syncope.  Remains very active at baseline without cardiac limitation.  No significant lower extremity swelling or progressive orthopnea.  No falls or symptoms concerning for bleeding.  Upon reviewing his medications he indicates he has not taken amlodipine  or spironolactone  for many months.  He requests a refill of sildenafil , not currently on standing nitro therapy and has not needed SL NTG in several years.  He is also looking forward to getting carpal tunnel surgery on his right upper extremity.  [facility] Activity Status Index: > 4 METs without cardiac limitation Revised Cardiac Risk Index: Low risk for noncardiac surgery   Labs independently reviewed: 04/2023 - A1c 6.1, TC 168, TG 81, HDL 62, LDL 91, BUN 12, serum creatinine 1.22, potassium 4.4, albumin 4.6, AST/ALT normal, Hgb 14.1, PLT 251 10/2021 - TSH normal  Past Medical History:  Diagnosis Date   Acute ST elevation myocardial infarction (STEMI) of inferior wall (HCC) 07/18/2017   a.) LHC/PCI 07/18/2017: 99% o-mRCA --> overlapping 3.0 x 38 mm and 3.25 x 18 mm Xience Sierra DES   Alcohol dependence, daily use (HCC)    a.) 3-4 beers daily   Angina pectoris (HCC) 11/06/2017   Bilateral carpal tunnel syndrome    BPH (benign prostatic hyperplasia) 06/19/2017   Bradycardia    Cardiac arrest with ventricular fibrillation (HCC) 07/18/2017   a.) in setting of acute inferior STEMI; required defib in field by EMS and again in the ED   Cervical spinal stenosis    a.) s/p ACDF C4-C6 08/22/2022   Chronic deep vein thrombosis (DVT) of brachial vein of left upper extremity (HCC)    Chronic systolic congestive heart failure (HCC) 09/29/2019   a.) TTE 09/29/2019: EF 45-50%, glob HK, mild MR, mild PA dil, G2DD; b.) TTE 11/28/2021: EF 45-50%, glob HK, G2DD; c.) TTE 04/18/2022: EF 45-50%, glob HK, G2DD   Coronary artery disease of native artery of native heart with stable angina  pectoris (HCC) 07/18/2017   a.) LHC/PCI 07/18/17: 99 o-mRCA (overlapping 3.0x27mm & 3.25x9mm Xience Sierra DES), 60 post atrio, 85 dLM-oLAD, 40 pLCx, 60 pLAD, 40 mLAD; b.) LHC 08/19/17: 85 dLM-oLAD (PTCA), 60 pLAD, 30 mLAD, 40 pLCx, 50 RI; c.) LHC/PCI 09/09/17: unchanged LHC. 3.0 x 28mm Xience Sierra DES dLM-oLAD; d.) LHC 11/07/17: 30 mLM, 40 pLCx, 70 RI, 40 dLAD - Rx mgmt; e.) LHC 10/17/21: 70 oRI, 20 mLAD, 30 pLCx - Rx mgmt   ED (erectile dysfunction) 06/19/2017   a.) on PDE5i (sildenafil )   Former smoker    GERD (gastroesophageal reflux disease)    Hand dermatitis 07/26/2014   Hemorrhoid    Hyperlipidemia    Hypertension    Impingement syndrome of left shoulder region 10/14/2018   Ischemic cardiomyopathy    a.) TTE 07/18/2017: EF 45-50%; b.) LHC 08/19/2017: EF 45%; c.) TTE 09/29/2019: EF 45-50%; d.) MPI 01/18/2021: EF 47%; e.) TTE 11/28/2021: EF 45-50%; f.) TTE 04/18/2022:  EF 45-50%   Long term current use of aspirin     Long term current use of clopidogrel     Lumbar spondylosis with myelopathy 11/08/2016   MCI (mild cognitive impairment) 06/19/2017   Myelomalacia of cervical cord (HCC)    Overweight (BMI 25.0-29.9) 06/19/2017   PAD (peripheral artery disease) (HCC)    a.  01/2019 ABIs and Duplex: ABI R 0.78, L 0.79; TBI R 0.66, L 1.11. RCIA >50p, RSFA 50-74. LCIA >50p, LSFA 75-99p, 30-49d. 3 vessel runoff bilat.   PAF (paroxysmal atrial fibrillation) (HCC)    a.) brief episode during 06/2017 hospitalization for inferior STEMI --> converted to NSR with IV amiodarone  --> not on [facility]   Rectal polyp    Stroke (HCC) 09/26/2005   a.) seen acutely on brain MRI 09/26/2005 --> posterior frontal, mid parietal lobe, RIGHT occipital lobe --> tiny ischemic infarctions secondary to a shower of emboli  (watershed ischemic pattern may be present)   Subareolar gynecomastia in male    Thrombophilia (HCC) 09/29/2020   Urinary hesitancy 12/26/2020    Past Surgical History:  Procedure Laterality Date    ANAL FISTULECTOMY N/A 12/21/2014   Procedure: FISTULECTOMY ANAL;  Surgeon: Jerlean Mood, MD;  Location: [facility] ORS;  Service: General;  Laterality: N/A;   ANTERIOR CERVICAL DECOMP/DISCECTOMY FUSION N/A 08/22/2022   Procedure: C4-6 ANTERIOR CERVICAL DISCECTOMY AND FUSION (NUVASIVE ACP, GLOBUS HEDRON);  Surgeon: Jodeen Munch, MD;  Location: [facility] OR[facility];  Service: Neurosurgery;  Laterality: N/A;   APPENDECTOMY  1975   CARDIAC CATHETERIZATION     CARPAL TUNNEL RELEASE Left 07/04/2023   Procedure: CARPAL TUNNEL RELEASE ENDOSCOPIC;  Surgeon: Elner Hahn, MD;  Location: [facility] OR[facility];  Service: Orthopedics;  Laterality: Left;   COLONOSCOPY WITH PROPOFOL  N/A 02/12/2019   Procedure: COLONOSCOPY WITH PROPOFOL ;  Surgeon: Marnee Sink, MD;  Location: [facility];  Service: Endoscopy;  Laterality: N/A;   CORONARY STENT INTERVENTION N/A 09/09/2017   Procedure: CORONARY STENT INTERVENTION;  Surgeon: Wenona Hamilton, MD;  Location: [facility];  Service: Cardiovascular;  Laterality: N/A;   CORONARY ULTRASOUND/IVUS N/A 09/09/2017   Procedure: Intravascular Ultrasound/IVUS;  Surgeon: Wenona Hamilton, MD;  Location: [facility];  Service: Cardiovascular;  Laterality: N/A;   CORONARY/GRAFT ACUTE MI REVASCULARIZATION N/A 07/18/2017   Procedure: Coronary/Graft Acute MI Revascularization;  Surgeon: Wenona Hamilton, MD;  Location: [facility];  Service: Cardiovascular;  Laterality: N/A;   HEMORRHOID SURGERY N/A 12/21/2014   Procedure: HEMORRHOIDECTOMY;  Surgeon: Jerlean Mood, MD;  Location: [facility];  Service: General;  Laterality: N/A;   KNEE SURGERY Right age 32   LEFT HEART CATH AND CORONARY ANGIOGRAPHY N/A 07/18/2017   Procedure: LEFT HEART CATH AND CORONARY ANGIOGRAPHY;  Surgeon: Wenona Hamilton, MD;  Location: [facility];  Service: Cardiovascular;  Laterality: N/A;   LEFT HEART CATH AND CORONARY ANGIOGRAPHY Left 08/19/2017   Procedure: LEFT HEART CATH AND  CORONARY ANGIOGRAPHY;  Surgeon: Wenona Hamilton, MD;  Location: [facility];  Service: Cardiovascular;  Laterality: Left;   LEFT HEART CATH AND CORONARY ANGIOGRAPHY N/A 11/07/2017   Procedure: LEFT HEART CATH AND CORONARY ANGIOGRAPHY;  Surgeon: Wenona Hamilton, MD;  Location: [facility];  Service: Cardiovascular;  Laterality: N/A;   LEFT HEART CATH AND CORONARY ANGIOGRAPHY N/A 10/17/2021   Procedure: LEFT HEART CATH AND CORONARY ANGIOGRAPHY;  Surgeon: Wenona Hamilton, MD;  Location: [facility];  Service: Cardiovascular;  Laterality: N/A;   ROTATOR CUFF REPAIR Right 2013    Current Medications: Current Meds  Medication Sig   aspirin  EC 81 MG tablet Take 81 mg by mouth daily.    carvedilol  (COREG ) 3.125 MG tablet TAKE 1 TABLET TWICE DAILY WITH MEALS   clopidogrel  (PLAVIX ) 75 MG tablet Take 75 mg by mouth daily.   ezetimibe  (ZETIA ) 10  MG tablet Take 1 tablet (10 mg total) by mouth every morning.   gabapentin  (NEURONTIN ) 300 MG capsule Take 1 capsule (300 mg total) by mouth every morning.   nitroGLYCERIN  (NITROSTAT ) 0.4 MG SL tablet Place 1 tablet (0.4 mg total) under the tongue every 5 (five) minutes as needed for chest pain.   omeprazole  (PRILOSEC) 40 MG capsule Take 40 mg by mouth daily.   oxybutynin  (DITROPAN -XL) 10 MG 24 hr tablet TAKE 1 TABLET AT BEDTIME   rosuvastatin  (CRESTOR ) 40 MG tablet TAKE 1 TABLET EVERY DAY   sacubitril -valsartan  (ENTRESTO ) 49-51 MG Take 1 tablet by mouth 2 (two) times daily.   tamsulosin  (FLOMAX ) 0.4 MG CAPS capsule TAKE 1 CAPSULE EVERY DAY    Allergies:   Codeine, Shellfish allergy, Contrast media [iodinated contrast media], and Fish allergy   Social History   Socioeconomic History   Marital status: Married    Spouse name: Barrie Borer   Number of children: 2   Years of education: Not on file   Highest education level: 11th grade  Occupational History   Occupation: Retired  Tobacco Use   Smoking status: Former    Current  packs/day: 0.00    Types: Cigarettes    Quit date: 07/24/2007    Years since quitting: 16.4    Passive exposure: Past   Smokeless tobacco: Former    Types: Chew   Tobacco comments:    smoking cessation materials not required  Vaping Use   Vaping status: Never Used  Substance and Sexual Activity   Alcohol use: Yes    Alcohol/week: 5.0 - 6.0 standard drinks of alcohol    Types: 5 - 6 Cans of beer per week    Comment:  weekly   Drug use: No   Sexual activity: Not Currently  Other Topics Concern   Not on file  Social History Narrative   Lives in [place]. Retired.  Completed cardiac rehab program.   Social Drivers of Health   Financial Resource Strain: Low Risk  (12/27/2022)   Overall Financial Resource Strain (CARDIA)    Difficulty of Paying Living Expenses: Not hard at all  Food Insecurity: No Food Insecurity (12/27/2022)   Hunger Vital Sign    Worried About Running Out of Food in the Last Year: Never true    Ran Out of Food in the Last Year: Never true  Transportation Needs: No Transportation Needs (12/27/2022)   PRAPARE - Administrator, Civil Service (Medical): No    Lack of Transportation (Non-Medical): No  Physical Activity: Insufficiently Active (12/27/2022)   Exercise Vital Sign    Days of Exercise per Week: 2 days    Minutes of Exercise per Session: 20 min  Stress: No Stress Concern Present (12/27/2022)   [employer] of Occupational Health - Occupational Stress Questionnaire    Feeling of Stress : Not at all  Social Connections: Socially Integrated (12/27/2022)   Social Connection and Isolation Panel    Frequency of Communication with Friends and Family: More than three times a week    Frequency of Social Gatherings with Friends and Family: More than three times a week    Attends Religious Services: More than 4 times per year    Active Member of [employer] or Organizations: Yes    Attends Engineer, structural: More than 4 times per year    Marital Status:  Married     Family History:  The patient's family history includes Alzheimer's disease in his father and mother;  Hypertension in his father and mother. There is no history of Breast cancer.  ROS:   12-point review of systems is negative unless otherwise noted in the HPI.   EKGs/Labs/Other Studies Reviewed:    Studies reviewed were summarized above. The additional studies were reviewed today:  2D echo 12/18/2023: 1. Left ventricular ejection fraction, by estimation, is 50 to 55%. The  left ventricle has low normal function. The left ventricle has no regional  wall motion abnormalities. Left ventricular diastolic parameters were  normal.   2. Right ventricular systolic function is normal. The right ventricular  size is normal.   3. The mitral valve is normal in structure. No evidence of mitral valve  regurgitation.   4. The aortic valve is grossly normal. Aortic valve regurgitation is not  visualized.   5. The inferior vena cava is normal in size with greater than 50%  respiratory variability, suggesting right atrial pressure of 3 mmHg.  __________  Lexiscan  MPI 11/15/2023:   The study is normal. The study is low risk.   No ST deviation was noted.   LV perfusion is normal. There is no evidence of ischemia. There is no evidence of infarction.   Left ventricular function is normal. End diastolic cavity size is normal. End systolic cavity size is normal. __________  Limited echo 04/18/2022: 1. Left ventricular ejection fraction, by estimation, is 45 to 50%. The  left ventricle has mildly decreased function. The left ventricle  demonstrates global hypokinesis. Left ventricular diastolic parameters are  consistent with Grade II diastolic  dysfunction (pseudonormalization).   2. Right ventricular systolic function is normal.   3. The mitral valve is normal in structure. No evidence of mitral valve  regurgitation.   Comparison(s): Previous LVEF reported as 45-50%  Previous GLS  reported as -17.5.  __________   Zio patch 11/2021: Patient had a min HR of 42 bpm, max HR of 136 bpm, and avg HR of 66 bpm. Predominant underlying rhythm was Sinus Rhythm.  1 run of Supraventricular Tachycardia occurred lasting 6 beats with a max rate of 135 bpm (avg 105 bpm). Rare PACs and rare PVCs with a burden of less than 1%. No significant arrhythmia overall. __________   2D echo 11/28/2021: 1. Left ventricular ejection fraction, by estimation, is 45 to 50%. The  left ventricle has mildly decreased function. The left ventricle  demonstrates global hypokinesis. Left ventricular diastolic parameters are  consistent with Grade II diastolic  dysfunction (pseudonormalization). The average left ventricular global  longitudinal strain is -17.5 %. The global longitudinal strain is normal.   2. Right ventricular systolic function is normal. The right ventricular  size is normal.   3. The mitral valve is normal in structure. No evidence of mitral valve  regurgitation. No evidence of mitral stenosis.   4. The aortic valve is normal in structure. Aortic valve regurgitation is  not visualized. No aortic stenosis is present.   5. The inferior vena cava is normal in size with greater than 50%  respiratory variability, suggesting right atrial pressure of 3 mmHg. __________   LHC 10/17/2021:   Ost Ramus lesion is 70% stenosed.   Mid LAD lesion is 20% stenosed.   Prox Cx lesion is 30% stenosed.   Non-stenotic Prox LAD lesion was previously treated.   Non-stenotic Ost RCA to Mid RCA lesion was previously treated.   There is mild left ventricular systolic dysfunction.   LV end diastolic pressure is mildly elevated.   The left ventricular ejection fraction  is 45-50% by visual estimate.   1.  Widely patent RCA and LAD stents with no significant restenosis.  Stable 70% stenosis in ostial ramus.  No other obstructive disease. 2.  Mildly reduced LV systolic function and mildly elevated left  ventricular end-diastolic pressure.   Recommendations: No culprit is identified for the patient's left arm pain.  Continue aggressive medical therapy for coronary artery disease. __________   Lexiscan  MPI 01/18/2021: T wave inversion was noted during stress in the V6, V5, II, III and aVF leads. There was no ST segment deviation noted during stress. The study is normal. This is a low risk study. The left ventricular ejection fraction visually appears normal, although calculated value is mildly decreased at 47%. Correlation with echo advised. There is no evidence for ischemia. __________   ABIs 02/05/2020: Bilateral ABIs appear essentially unchanged compared to prior study on  01/26/19. ABIs and right TBI are consistent with previous exam. Drop in left  TBI but still normal.     Summary:  Right: Resting right ankle-brachial index indicates moderate right lower  extremity arterial disease. The right toe-brachial index is abnormal.   Left: Resting left ankle-brachial index indicates moderate left lower  extremity arterial disease. The left toe-brachial index is normal. __________   2D echo 09/29/2019: 1. Left ventricular ejection fraction, by estimation, is 45 to 50%. The  left ventricle has mildly decreased function. The left ventricle  demonstrates global hypokinesis. Left ventricular diastolic parameters are  consistent with Grade II diastolic  dysfunction (pseudonormalization). Elevated left atrial pressure.   2. Right ventricular systolic function is normal. The right ventricular  size is normal. Tricuspid regurgitation signal is inadequate for assessing  PA pressure.   3. The mitral valve is grossly normal. Mild mitral valve regurgitation.  No evidence of mitral stenosis.   4. The aortic valve is tricuspid. Aortic valve regurgitation is not  visualized. No aortic stenosis is present.   5. Mildly dilated pulmonary artery.   6. The inferior vena cava is normal in size with greater  than 50%  respiratory variability, suggesting right atrial pressure of 3 mmHg. __________   2D echo 08/24/2019: There was no ST segment deviation noted during stress. The study is normal. This is a low risk study. The left ventricular ejection fraction is normal (55-65%). __________   LHC 11/07/2017: Mid LAD lesion is 30% stenosed. Previously placed Prox LAD drug eluting stent is widely patent. Prox Cx lesion is 40% stenosed. Non-stenotic Ost RCA to Mid RCA lesion previously treated. Ost Ramus lesion is 70% stenosed. Dist LAD lesion is 40% stenosed. LV end diastolic pressure is mildly elevated. There is mild left ventricular systolic dysfunction. The left ventricular ejection fraction is 50-55% by visual estimate.   1.  Widely patent LAD and RCA stents with no significant restenosis.  There is a relatively small size ramus br which is pinched by the LAD stent with 70% ostial stenosis.  No other obstructive disease. 2.  Low normal LV systolic function with an EF of 50-55% with mild inferior wall hypokinesis.  Mildly elevated left ventricular end-diastolic pressure.   Recommendations: Continue medical therapy.  Add sublingual nitroglycerin  to be used as needed. The patient can be discharged home from a cardiac standpoint. __________   [facility] 09/09/2017: Dist LM to Ost LAD lesion is 85% stenosed. Prox LAD lesion is 60% stenosed. Mid LAD lesion is 30% stenosed. Prox Cx lesion is 40% stenosed. Non-stenotic Ost RCA to Mid RCA lesion previously treated. Ost Ramus lesion  is 50% stenosed. Post intervention, there is a 0% residual stenosis. Post intervention, there is a 0% residual stenosis. A drug-eluting stent was successfully placed using a STENT SIERRA 3.00 X 38 MM.   Successful IVUS guided drug-eluting stent placement to the proximal and ostial LAD with one long stent.   Recommendations: Dual antiplatelet therapy for at least one year.  Aggressive treatment of risk  factors. __________   LHC 08/19/2017: Dist LM to Ost LAD lesion is 85% stenosed. Prox LAD lesion is 60% stenosed. Mid LAD lesion is 30% stenosed. Prox Cx lesion is 40% stenosed. Previously placed Ost RCA to Mid RCA drug eluting stent and stent (unknown type) is widely patent. Balloon angioplasty was performed. Ost Ramus lesion is 50% stenosed. There is mild left ventricular systolic dysfunction. LV end diastolic pressure is normal. The left ventricular ejection fraction is 45-50% by visual estimate.   1.  Significant underlying two-vessel coronary artery disease with widely patent stents in the right coronary artery without any restenosis.  Significant ostial LAD stenosis is unchanged with moderate disease in the proximal and mid segment. 2.  Mildly reduced LV systolic function with an EF of 45% with inferior wall hypokinesis.  Normal left ventricular end-diastolic pressure.   Recommendations: Continue medical therapy for now.  I will discuss with my colleagues about management options of ostial LAD stenosis with PCI versus one-vessel CABG. __________   2D echo 07/18/2017: - Left ventricle: The cavity size was normal. There was mild    concentric hypertrophy. Systolic function was mildly reduced. The    estimated ejection fraction was in the range of 45% to 50%.    Moderate hypokinesis of the inferior and inferoseptal myocardium.    The study was not technically sufficient to allow evaluation of    LV diastolic dysfunction due to atrial fibrillation.  - Left atrium: The atrium was mildly dilated. __________   [facility] 09/18/2016: Ost RCA to Mid RCA lesion is 99% stenosed. A drug-eluting stent was successfully placed using a STENT SIERRA 3.00 X 38 MM. Post intervention, there is a 0% residual stenosis. A stent was successfully placed. Post Atrio lesion is 60% stenosed. Dist LM to Ost LAD lesion is 85% stenosed. Prox Cx lesion is 40% stenosed. Prox LAD lesion is 60% stenosed. Mid LAD  lesion is 40% stenosed.   1.  Significant two-vessel coronary artery disease with subtotal occlusion of the proximal right coronary artery which is the culprit for inferior ST elevation myocardial infarction.  There is also 85% ostial LAD stenosis.  There is a medium sized ramus branch with 60% ostial stenosis. 2.  Normal left ventricular end-diastolic pressure. 3.  Successful complex angioplasty and 2 overlapped drug-eluting stent placement to the proximal and ostial right coronary artery.   Recommendations: The patient had hypotension throughout PCI likely due to RV involvement.  He responded to IV fluids and norepinephrine  drip.  Continue dual antiplatelet therapy for at least one year.  Aggressive treatment of risk factors. Further ischemic evaluation of the LAD will be required in the near future.  Angiography was suboptimal given the patient's significant tachycardia. This was an overall difficult procedure due to long diffuse disease. __________   2D echo 04/09/2016: - Left ventricle: The cavity size was normal. Wall thickness was    normal. Systolic function was normal. The estimated ejection    fraction was in the range of 55% to 60%. Wall motion was normal;    there were no regional wall motion abnormalities. Left  ventricular diastolic function parameters were normal.   Impressions:   - Normal study. __________   Lexiscan  MPI 04/06/2016: There was no ST segment deviation noted during stress. No T wave inversion was noted during stress. This is a low risk study. The left ventricular ejection fraction is normal (55-65%). Defect 1: There is a small defect of mild severity present in the apex location. This is patrially reversible and suspected to be due to an artifact given intense GI uptake which affected the quality of study. Correlate clinically   EKG:  EKG is not ordered today.   Recent Labs: 04/24/2023: ALT 41; BUN 12; Creatinine, Ser 1.22; Hemoglobin 14.1; Platelets  251; Potassium 4.4; Sodium 140  Recent Lipid Panel    Component Value Date/Time   CHOL 168 04/24/2023 1034   TRIG 81 04/24/2023 1034   HDL 62 04/24/2023 1034   CHOLHDL 2.7 04/24/2023 1034   CHOLHDL 2.8 11/07/2017 0511   VLDL 4 11/07/2017 0511   LDLCALC 91 04/24/2023 1034   LDLDIRECT 116 (H) 09/24/2017 1338    PHYSICAL EXAM:    VS:  BP 130/70 (BP Location: Left Arm, Patient Position: Sitting, Cuff Size: Normal)   Pulse (!) 56   Ht 5' 7 (1.702 m)   Wt 181 lb (82.1 kg)   SpO2 93%   BMI 28.35 kg/m   BMI: Body mass index is 28.35 kg/m.  Physical Exam Vitals reviewed.  Constitutional:      Appearance: He is well-developed.  HENT:     Head: Normocephalic and atraumatic.   Eyes:     General:        Right eye: No discharge.        Left eye: No discharge.    Cardiovascular:     Rate and Rhythm: Normal rate and regular rhythm.     Heart sounds: Normal heart sounds, S1 normal and S2 normal. Heart sounds not distant. No midsystolic click and no opening snap. No murmur heard.    No friction rub.  Pulmonary:     Effort: Pulmonary effort is normal. No respiratory distress.     Breath sounds: Normal breath sounds. No decreased breath sounds, wheezing, rhonchi or rales.  Chest:     Chest wall: No tenderness.   Musculoskeletal:     Cervical back: Normal range of motion.     Right lower leg: No edema.     Left lower leg: No edema.   Skin:    General: Skin is warm and dry.     Nails: There is no clubbing.   Neurological:     Mental Status: He is alert and oriented to person, place, and time.   Psychiatric:        Speech: Speech normal.        Behavior: Behavior normal.        Thought Content: Thought content normal.        Judgment: Judgment normal.     Wt Readings from Last 3 Encounters:  01/03/24 181 lb (82.1 kg)  12/18/23 180 lb 12.8 oz (82 kg)  12/02/23 183 lb 2 oz (83.1 kg)     ASSESSMENT & PLAN:   CAD involving the native coronary arteries without angina:  He is doing well and without symptoms concerning for angina.  Recent Lexiscan  MPI was low risk and without evidence of ischemia or infarction with preserved LV systolic function.  Continue aggressive risk factor modification and secondary prevention including aspirin  81 mg, clopidogrel  75 mg, carvedilol  3.125 mg twice  daily, ezetimibe  10 mg, and rosuvastatin  40 mg.  No indication for further ischemic testing at this time.  HFimpEF: Most recent echo showed low normal LV systolic function.  He is euvolemic and well compensated with NYHA class I symptoms.  He reports having been off of spironolactone  for many months, including for several months leading up to his most recent echo.  Given improvement in LV systolic function in an effort to minimize pharmacotherapy we will defer reinitiation of spironolactone  at this time.  However, should he redevelop cardiomyopathy or symptoms from failure this will need to be reinitiated at that time.  For now, he remains on Entresto  49/51 mg twice daily and carvedilol  3.125 mg twice daily.  Not requiring standing loop diuretic.  Lone A-fib: Noted following MI and VF arrest.  Subsequent outpatient cardiac monitoring showed no further evidence of atrial arrhythmia.  Not on anticoagulation given isolated episode in the context of significant acute illness.  Should recurrence of A-fib be identified, OAC will need to be revisited.  HTN: Blood pressure is reasonably controlled in the office today.  He does have a history of orthostasis.  Continue current dose of Entresto  and carvedilol .  HLD: LDL 91 in 04/2023.  Remains on rosuvastatin  40 mg and ezetimibe  10 mg.  If LDL remains above goal in follow-up we will need to consider PCSK9 inhibitor.  Preoperative cardiac risk stratification: He is planning to undergo carpal tunnel surgery.  He is without symptoms of angina or cardiac decompensation.  Recent echo showed low normal LV systolic function which was an improvement from prior.   Recent Lexiscan  MPI showed no evidence of ischemia or infarction and was overall low risk.  He is able to achieve greater than 4 METs per Duke Activity Status Index without cardiac limitation.  He may proceed with noncardiac surgery at an overall low risk per RCRI without further cardiac testing.  He will need to hold clopidogrel  for 5 days prior to surgery with recommendation to resume as soon as safely possible in the postoperative timeframe at the discretion of his surgeon.  Aspirin  81 mg daily should be continued throughout the periprocedural timeframe.  ED: Request refill of sildenafil  which was provided.  Strict precautions regarding avoidance of usage of SL NTG were discussed in detail up to and including death.     Disposition: F/u with Dr. Alvenia Aus or an APP in 6 months.   Medication Adjustments/Labs and Tests Ordered: Current medicines are reviewed at length with the patient today.  Concerns regarding medicines are outlined above. Medication changes, Labs and Tests ordered today are summarized above and listed in the Patient Instructions accessible in Encounters.   Signed, Varney Gentleman, PA-C 01/03/2024 12:55 PM     [facility] [place] [address] [place], [place] [address] [phone]

## 2024-01-03 ENCOUNTER — Ambulatory Visit: Attending: Physician Assistant | Admitting: Physician Assistant

## 2024-01-03 ENCOUNTER — Encounter: Payer: Self-pay | Admitting: Physician Assistant

## 2024-01-03 VITALS — BP 130/70 | HR 56 | Ht 67.0 in | Wt 181.0 lb

## 2024-01-03 DIAGNOSIS — I10 Essential (primary) hypertension: Secondary | ICD-10-CM

## 2024-01-03 DIAGNOSIS — I5032 Chronic diastolic (congestive) heart failure: Secondary | ICD-10-CM | POA: Diagnosis not present

## 2024-01-03 DIAGNOSIS — E785 Hyperlipidemia, unspecified: Secondary | ICD-10-CM

## 2024-01-03 DIAGNOSIS — N529 Male erectile dysfunction, unspecified: Secondary | ICD-10-CM

## 2024-01-03 DIAGNOSIS — Z01810 Encounter for preprocedural cardiovascular examination: Secondary | ICD-10-CM | POA: Diagnosis not present

## 2024-01-03 DIAGNOSIS — I4891 Unspecified atrial fibrillation: Secondary | ICD-10-CM

## 2024-01-03 DIAGNOSIS — I2510 Atherosclerotic heart disease of native coronary artery without angina pectoris: Secondary | ICD-10-CM | POA: Diagnosis not present

## 2024-01-03 MED ORDER — SILDENAFIL CITRATE 20 MG PO TABS: 20.0000 mg | ORAL_TABLET | Freq: Every day | ORAL | 2 refills | Status: DC | PRN

## 2024-01-03 NOTE — Patient Instructions (Signed)
 Medication Instructions:  Your physician recommends the following medication changes.  STOP TAKING: Spironolactone   HOLD: Plavix  5 days prior to your hand surgery and resume once procedure is completed  *If you need a refill on your cardiac medications before your next appointment, please call your pharmacy*  Lab Work: None ordered at this time   Follow-Up: At [facility], you and your health needs are our priority.  As part of our continuing mission to provide you with exceptional heart care, our providers are all part of one team.  This team includes your primary Cardiologist (physician) and Advanced Practice Providers or APPs (Physician Assistants and Nurse Practitioners) who all work together to provide you with the care you need, when you need it.  Your next appointment:   6 month(s)  Provider:   You may see Antionette Kirks, MD or Varney Gentleman, PA-C Cadence Gennaro Khat, PA-C Ronald Cockayne, NP Morey Ar, NP   We recommend signing up for the patient portal called MyChart.  Sign up information is provided on this After Visit Summary.  MyChart is used to connect with patients for Virtual Visits (Telemedicine).  Patients are able to view lab/test results, encounter notes, upcoming appointments, etc.  Non-urgent messages can be sent to your provider as well.   To learn more about what you can do with MyChart, go to [email or web address].

## 2024-01-10 ENCOUNTER — Telehealth: Payer: Self-pay | Admitting: Pharmacy Technician

## 2024-01-10 ENCOUNTER — Encounter: Payer: Self-pay | Admitting: Emergency Medicine

## 2024-01-10 ENCOUNTER — Other Ambulatory Visit: Payer: Self-pay | Admitting: Emergency Medicine

## 2024-01-10 MED ORDER — SILDENAFIL CITRATE 20 MG PO TABS: 20.0000 mg | ORAL_TABLET | Freq: Three times a day (TID) | ORAL | 1 refills | Status: DC | PRN

## 2024-01-10 NOTE — Telephone Encounter (Signed)
 Pharmacy Patient Advocate Encounter   Received notification from Physician's Office that prior authorization for Sildenafil  20MG  is required/requested.   Insurance verification completed.   The patient is insured through [employer] .   Per test claim: PA required; PA submitted to above mentioned insurance via CoverMyMeds Key/confirmation #/EOC [id number] Status is pending

## 2024-01-10 NOTE — Telephone Encounter (Signed)
 Pharmacy Patient Advocate Encounter  Received notification from [employer] that Prior Authorization for Sildenafil  has been DENIED.  See denial reason below. No denial letter attached in CMM. Will attach denial letter to Media tab once received.   PA #/Case ID/Reference #: [id number]

## 2024-01-10 NOTE — Progress Notes (Signed)
 PA was denied on med reordered with prior med parameters, pharm messaged

## 2024-01-15 ENCOUNTER — Ambulatory Visit: Admitting: Emergency Medicine

## 2024-01-15 VITALS — Ht 67.0 in | Wt 185.0 lb

## 2024-01-15 DIAGNOSIS — Z0000 Encounter for general adult medical examination without abnormal findings: Secondary | ICD-10-CM | POA: Diagnosis not present

## 2024-01-15 NOTE — Patient Instructions (Signed)
 Todd Weaver , Thank you for taking time out of your busy schedule to complete your Annual Wellness Visit with me. I enjoyed our conversation and look forward to speaking with you again next year. I, as well as your care team,  appreciate your ongoing commitment to your health goals. Please review the following plan we discussed and let me know if I can assist you in the future. Your Game plan/ To Do List    Referrals: None  Follow up Visits: Next Medicare AWV with our clinical staff: 01/20/25 @ 1:20pm (PHONE VISIT)   Have you seen your provider in the last 6 months (3 months if uncontrolled diabetes)? Yes Next Office Visit with your provider: 04/27/24 @ 8:20am with Dr. Justus  Clinician Recommendations: Get the 2nd Shingrix  vaccine at your local pharmacy. Get Covid and flu vaccines in the fall.  Aim for 30 minutes of exercise or brisk walking, 6-8 glasses of water, and 5 servings of fruits and vegetables each day.       This is a list of the screening recommended for you and due dates:  Health Maintenance  Topic Date Due   Zoster (Shingles) Vaccine (1 of 2) Never done   COVID-19 Vaccine (3 - 2024-25 season) 03/24/2023   Flu Shot  02/21/2024   Medicare Annual Wellness Visit  01/14/2025   DTaP/Tdap/Td vaccine (2 - Td or Tdap) 06/20/2027   Colon Cancer Screening  02/11/2029   Pneumococcal Vaccine for age over 3  Completed   Hepatitis C Screening  Completed   Hepatitis B Vaccine  Aged Out   HPV Vaccine  Aged Out   Meningitis B Vaccine  Aged Out    Advanced directives: (ACP Link)Information on Advanced Care Planning can be found at [place]  Best boy Advance Health Care Directives Advance Health Care Directives. [email or web address] You may also get the forms at your doctor's office. Advance Care Planning is important because it:  [x]  Makes sure you receive the medical care that is consistent with your values, goals, and preferences  [x]  It provides guidance to your family and loved  ones and reduces their decisional burden about whether or not they are making the right decisions based on your wishes.  Follow the link provided in your after visit summary or read over the paperwork we have mailed to you to help you started getting your Advance Directives in place. If you need assistance in completing these, please reach out to us  so that we can help you!  See attachments for Preventive Care and Fall Prevention Tips.   Fall Prevention in the Home, Adult Falls can cause injuries and affect people of all ages. There are many simple things that you can do to make your home safe and to help prevent falls. If you need it, ask for help making these changes. What actions can I take to prevent falls? General information Use good lighting in all rooms. Make sure to: Replace any light bulbs that burn out. Turn on lights if it is dark and use night-lights. Keep items that you use often in easy-to-reach places. Lower the shelves around your home if needed. Move furniture so that there are clear paths around it. Do not keep throw rugs or other things on the floor that can make you trip. If any of your floors are uneven, fix them. Add color or contrast paint or tape to clearly mark and help you see: Grab bars or handrails. First and last steps of staircases. Where the  edge of each step is. If you use a ladder or stepladder: Make sure that it is fully opened. Do not climb a closed ladder. Make sure the sides of the ladder are locked in place. Have someone hold the ladder while you use it. Know where your pets are as you move through your home. What can I do in the bathroom?     Keep the floor dry. Clean up any water that is on the floor right away. Remove soap buildup in the bathtub or shower. Buildup makes bathtubs and showers slippery. Use non-skid mats or decals on the floor of the bathtub or shower. Attach bath mats securely with double-sided, non-slip rug tape. If you need  to sit down while you are in the shower, use a non-slip stool. Install grab bars by the toilet and in the bathtub and shower. Do not use towel bars as grab bars. What can I do in the bedroom? Make sure that you have a light by your bed that is easy to reach. Do not use any sheets or blankets on your bed that hang to the floor. Have a firm bench or chair with side arms that you can use for support when you get dressed. What can I do in the kitchen? Clean up any spills right away. If you need to reach something above you, use a sturdy step stool that has a grab bar. Keep electrical cables out of the way. Do not use floor polish or wax that makes floors slippery. What can I do with my stairs? Do not leave anything on the stairs. Make sure that you have a light switch at the top and the bottom of the stairs. Have them installed if you do not have them. Make sure that there are handrails on both sides of the stairs. Fix handrails that are broken or loose. Make sure that handrails are as long as the staircases. Install non-slip stair treads on all stairs in your home if they do not have carpet. Avoid having throw rugs at the top or bottom of stairs, or secure the rugs with carpet tape to prevent them from moving. Choose a carpet design that does not hide the edge of steps on the stairs. Make sure that carpet is firmly attached to the stairs. Fix any carpet that is loose or worn. What can I do on the outside of my home? Use bright outdoor lighting. Repair the edges of walkways and driveways and fix any cracks. Clear paths of anything that can make you trip, such as tools or rocks. Add color or contrast paint or tape to clearly mark and help you see high doorway thresholds. Trim any bushes or trees on the main path into your home. Check that handrails are securely fastened and in good repair. Both sides of all steps should have handrails. Install guardrails along the edges of any raised decks or  porches. Have leaves, snow, and ice cleared regularly. Use sand, salt, or ice melt on walkways during winter months if you live where there is ice and snow. In the garage, clean up any spills right away, including grease or oil spills. What other actions can I take? Review your medicines with your health care provider. Some medicines can make you confused or feel dizzy. This can increase your chance of falling. Wear closed-toe shoes that fit well and support your feet. Wear shoes that have rubber soles and low heels. Use a cane, walker, scooter, or crutches that help you move  around if needed. Talk with your provider about other ways that you can decrease your risk of falls. This may include seeing a physical therapist to learn to do exercises to improve movement and strength. Where to find more information Centers for Disease Control and Prevention, STEAD[employer]: TonerPromos.no [employer] on Aging: BaseRingTones.pl National Institute on Aging: BaseRingTones.pl Contact a health care provider if: You are afraid of falling at home. You feel weak, drowsy, or dizzy at home. You fall at home. Get help right away if you: Lose consciousness or have trouble moving after a fall. Have a fall that causes a head injury. These symptoms may be an emergency. Get help right away. Call 911. Do not wait to see if the symptoms will go away. Do not drive yourself to the hospital. This information is not intended to replace advice given to you by your health care provider. Make sure you discuss any questions you have with your health care provider. Document Revised: 03/12/2022 Document Reviewed: 03/12/2022 Elsevier Patient Education  2024 [employer].

## 2024-01-15 NOTE — Progress Notes (Signed)
 Subjective:   Todd Weaver is a 73 y.o. who presents for a Medicare Wellness preventive visit.  As a reminder, Annual Wellness Visits don't include a physical exam, and some assessments may be limited, especially if this visit is performed virtually. We may recommend an in-person follow-up visit with your provider if needed.  Visit Complete: Virtual I connected with  Todd Weaver on 01/15/24 by a audio enabled telemedicine application and verified that I am speaking with the correct person using two identifiers.  Patient Location: Home  Provider Location: Home Office  I discussed the limitations of evaluation and management by telemedicine. The patient expressed understanding and agreed to proceed.  Vital Signs: Because this visit was a virtual/telehealth visit, some criteria may be missing or patient reported. Any vitals not documented were not able to be obtained and vitals that have been documented are patient reported.  VideoDeclined- This patient declined Librarian, academic. Therefore the visit was completed with audio only.  Persons Participating in Visit: Patient.  AWV Questionnaire: No: Patient Medicare AWV questionnaire was not completed prior to this visit.  Cardiac Risk Factors include: advanced age (>37men, >26 women);male gender;hypertension;dyslipidemia;Other (see comment), Risk factor comments: CAD, prediabetic     Objective:    Today's Vitals   01/15/24 1315  Weight: 185 lb (83.9 kg)  Height: 5' 7 (1.702 m)   Body mass index is 28.98 kg/m.     01/15/2024    1:28 PM 07/04/2023   11:32 AM 04/29/2023    9:28 AM 04/18/2023    8:16 AM 12/27/2022    9:25 AM 08/22/2022    6:33 AM 08/15/2022   11:17 AM  Advanced Directives  Does Patient Have a Medical Advance Directive? No No No No No No No  Would patient like information on creating a medical advance directive? Yes (MAU/Ambulatory/Procedural Areas - Information given) No - Patient  declined   No - Patient declined No - Patient declined     Current Medications (verified) Outpatient Encounter Medications as of 01/15/2024  Medication Sig   aspirin  EC 81 MG tablet Take 81 mg by mouth daily.    carvedilol  (COREG ) 3.125 MG tablet TAKE 1 TABLET TWICE DAILY WITH MEALS   clopidogrel  (PLAVIX ) 75 MG tablet Take 75 mg by mouth daily.   ezetimibe  (ZETIA ) 10 MG tablet Take 1 tablet (10 mg total) by mouth every morning.   gabapentin  (NEURONTIN ) 300 MG capsule Take 1 capsule (300 mg total) by mouth every morning.   nitroGLYCERIN  (NITROSTAT ) 0.4 MG SL tablet Place 1 tablet (0.4 mg total) under the tongue every 5 (five) minutes as needed for chest pain.   omeprazole  (PRILOSEC) 40 MG capsule Take 40 mg by mouth daily.   oxybutynin  (DITROPAN -XL) 10 MG 24 hr tablet TAKE 1 TABLET AT BEDTIME   rosuvastatin  (CRESTOR ) 40 MG tablet TAKE 1 TABLET EVERY DAY   sacubitril -valsartan  (ENTRESTO ) 49-51 MG Take 1 tablet by mouth 2 (two) times daily.   sildenafil  (REVATIO ) 20 MG tablet Take 1 tablet (20 mg total) by mouth 3 (three) times daily as needed.   tamsulosin  (FLOMAX ) 0.4 MG CAPS capsule TAKE 1 CAPSULE EVERY DAY   No facility-administered encounter medications on file as of 01/15/2024.    Allergies (verified) Codeine, Shellfish allergy, Contrast media [iodinated contrast media], and Fish allergy   History: Past Medical History:  Diagnosis Date   Acute ST elevation myocardial infarction (STEMI) of inferior wall (HCC) 07/18/2017   a.) LHC/PCI 07/18/2017: 99% o-mRCA -->  overlapping 3.0 x 38 mm and 3.25 x 18 mm Xience Sierra DES   Alcohol dependence, daily use (HCC)    a.) 3-4 beers daily   Angina pectoris (HCC) 11/06/2017   Bilateral carpal tunnel syndrome    BPH (benign prostatic hyperplasia) 06/19/2017   Bradycardia    Cardiac arrest with ventricular fibrillation (HCC) 07/18/2017   a.) in setting of acute inferior STEMI; required defib in field by EMS and again in the ED   Cervical  spinal stenosis    a.) s/p ACDF C4-C6 08/22/2022   Chronic deep vein thrombosis (DVT) of brachial vein of left upper extremity (HCC)    Chronic systolic congestive heart failure (HCC) 09/29/2019   a.) TTE 09/29/2019: EF 45-50%, glob HK, mild MR, mild PA dil, G2DD; b.) TTE 11/28/2021: EF 45-50%, glob HK, G2DD; c.) TTE 04/18/2022: EF 45-50%, glob HK, G2DD   Coronary artery disease of native artery of native heart with stable angina pectoris (HCC) 07/18/2017   a.) LHC/PCI 07/18/17: 99 o-mRCA (overlapping 3.0x90mm & 3.25x43mm Xience Sierra DES), 60 post atrio, 85 dLM-oLAD, 40 pLCx, 60 pLAD, 40 mLAD; b.) LHC 08/19/17: 85 dLM-oLAD (PTCA), 60 pLAD, 30 mLAD, 40 pLCx, 50 RI; c.) LHC/PCI 09/09/17: unchanged LHC. 3.0 x 28mm Xience Sierra DES dLM-oLAD; d.) LHC 11/07/17: 30 mLM, 40 pLCx, 70 RI, 40 dLAD - Rx mgmt; e.) LHC 10/17/21: 70 oRI, 20 mLAD, 30 pLCx - Rx mgmt   ED (erectile dysfunction) 06/19/2017   a.) on PDE5i (sildenafil )   Former smoker    GERD (gastroesophageal reflux disease)    Hand dermatitis 07/26/2014   Hemorrhoid    Hyperlipidemia    Hypertension    Impingement syndrome of left shoulder region 10/14/2018   Ischemic cardiomyopathy    a.) TTE 07/18/2017: EF 45-50%; b.) LHC 08/19/2017: EF 45%; c.) TTE 09/29/2019: EF 45-50%; d.) MPI 01/18/2021: EF 47%; e.) TTE 11/28/2021: EF 45-50%; f.) TTE 04/18/2022:  EF 45-50%   Long term current use of aspirin     Long term current use of clopidogrel     Lumbar spondylosis with myelopathy 11/08/2016   MCI (mild cognitive impairment) 06/19/2017   Myelomalacia of cervical cord (HCC)    Overweight (BMI 25.0-29.9) 06/19/2017   PAD (peripheral artery disease) (HCC)    a. 01/2019 ABIs and Duplex: ABI R 0.78, L 0.79; TBI R 0.66, L 1.11. RCIA >50p, RSFA 50-74. LCIA >50p, LSFA 75-99p, 30-49d. 3 vessel runoff bilat.   PAF (paroxysmal atrial fibrillation) (HCC)    a.) brief episode during 06/2017 hospitalization for inferior STEMI --> converted to NSR with IV amiodarone   --> not on [facility]   Rectal polyp    Stroke (HCC) 09/26/2005   a.) seen acutely on brain MRI 09/26/2005 --> posterior frontal, mid parietal lobe, RIGHT occipital lobe --> tiny ischemic infarctions secondary to a shower of emboli  (watershed ischemic pattern may be present)   Subareolar gynecomastia in male    Thrombophilia (HCC) 09/29/2020   Urinary hesitancy 12/26/2020   Past Surgical History:  Procedure Laterality Date   ANAL FISTULECTOMY N/A 12/21/2014   Procedure: FISTULECTOMY ANAL;  Surgeon: Louanne KANDICE Muse, MD;  Location: [facility] ORS;  Service: General;  Laterality: N/A;   ANTERIOR CERVICAL DECOMP/DISCECTOMY FUSION N/A 08/22/2022   Procedure: C4-6 ANTERIOR CERVICAL DISCECTOMY AND FUSION (NUVASIVE ACP, GLOBUS HEDRON);  Surgeon: Clois Fret, MD;  Location: [facility] ORS;  Service: Neurosurgery;  Laterality: N/A;   APPENDECTOMY  1975   CARDIAC CATHETERIZATION     CARPAL TUNNEL RELEASE Left 07/04/2023  Procedure: CARPAL TUNNEL RELEASE ENDOSCOPIC;  Surgeon: Edie Norleen PARAS, MD;  Location: [facility] ORS;  Service: Orthopedics;  Laterality: Left;   COLONOSCOPY WITH PROPOFOL  N/A 02/12/2019   Procedure: COLONOSCOPY WITH PROPOFOL ;  Surgeon: Jinny Carmine, MD;  Location: [facility] ENDOSCOPY;  Service: Endoscopy;  Laterality: N/A;   CORONARY STENT INTERVENTION N/A 09/09/2017   Procedure: CORONARY STENT INTERVENTION;  Surgeon: Darron Deatrice LABOR, MD;  Location: [facility] INVASIVE CV LAB;  Service: Cardiovascular;  Laterality: N/A;   CORONARY ULTRASOUND/IVUS N/A 09/09/2017   Procedure: Intravascular Ultrasound/IVUS;  Surgeon: Darron Deatrice LABOR, MD;  Location: [facility] INVASIVE CV LAB;  Service: Cardiovascular;  Laterality: N/A;   CORONARY/GRAFT ACUTE MI REVASCULARIZATION N/A 07/18/2017   Procedure: Coronary/Graft Acute MI Revascularization;  Surgeon: Darron Deatrice LABOR, MD;  Location: [facility] INVASIVE CV LAB;  Service: Cardiovascular;  Laterality: N/A;   HEMORRHOID SURGERY N/A 12/21/2014   Procedure: HEMORRHOIDECTOMY;  Surgeon:  Louanne KANDICE Muse, MD;  Location: [facility] ORS;  Service: General;  Laterality: N/A;   KNEE SURGERY Right age 79   LEFT HEART CATH AND CORONARY ANGIOGRAPHY N/A 07/18/2017   Procedure: LEFT HEART CATH AND CORONARY ANGIOGRAPHY;  Surgeon: Darron Deatrice LABOR, MD;  Location: [facility] INVASIVE CV LAB;  Service: Cardiovascular;  Laterality: N/A;   LEFT HEART CATH AND CORONARY ANGIOGRAPHY Left 08/19/2017   Procedure: LEFT HEART CATH AND CORONARY ANGIOGRAPHY;  Surgeon: Darron Deatrice LABOR, MD;  Location: [facility] INVASIVE CV LAB;  Service: Cardiovascular;  Laterality: Left;   LEFT HEART CATH AND CORONARY ANGIOGRAPHY N/A 11/07/2017   Procedure: LEFT HEART CATH AND CORONARY ANGIOGRAPHY;  Surgeon: Darron Deatrice LABOR, MD;  Location: [facility] INVASIVE CV LAB;  Service: Cardiovascular;  Laterality: N/A;   LEFT HEART CATH AND CORONARY ANGIOGRAPHY N/A 10/17/2021   Procedure: LEFT HEART CATH AND CORONARY ANGIOGRAPHY;  Surgeon: Darron Deatrice LABOR, MD;  Location: [facility] INVASIVE CV LAB;  Service: Cardiovascular;  Laterality: N/A;   ROTATOR CUFF REPAIR Right 2013   Family History  Problem Relation Age of Onset   Hypertension Mother    Alzheimer's disease Mother    Hypertension Father    Alzheimer's disease Father    Breast cancer Neg Hx    Social History   Socioeconomic History   Marital status: Married    Spouse name: Merlynn Caldron   Number of children: 2   Years of education: Not on file   Highest education level: 11th grade  Occupational History   Occupation: Retired  Tobacco Use   Smoking status: Former    Current packs/day: 0.00    Average packs/day: 1 pack/day for 38.0 years (38.0 ttl pk-yrs)    Types: Cigarettes    Start date: 67    Quit date: 07/24/2007    Years since quitting: 16.4    Passive exposure: Past   Smokeless tobacco: Former    Types: Chew    Quit date: 2005   Tobacco comments:    smoking cessation materials not required  Vaping Use   Vaping status: Never Used  Substance and Sexual Activity    Alcohol use: Yes    Alcohol/week: 7.0 - 14.0 standard drinks of alcohol    Types: 7 - 14 Cans of beer per week    Comment: 1-2 beers daily   Drug use: No   Sexual activity: Not Currently  Other Topics Concern   Not on file  Social History Narrative   Lives in [place]. Retired.  Completed cardiac rehab program.   Social Drivers of Health   Financial Resource Strain: Low  Risk  (01/15/2024)   Overall Financial Resource Strain (CARDIA)    Difficulty of Paying Living Expenses: Not hard at all  Food Insecurity: No Food Insecurity (01/15/2024)   Hunger Vital Sign    Worried About Running Out of Food in the Last Year: Never true    Ran Out of Food in the Last Year: Never true  Transportation Needs: No Transportation Needs (01/15/2024)   PRAPARE - Administrator, Civil Service (Medical): No    Lack of Transportation (Non-Medical): No  Physical Activity: Inactive (01/15/2024)   Exercise Vital Sign    Days of Exercise per Week: 0 days    Minutes of Exercise per Session: 0 min  Stress: No Stress Concern Present (01/15/2024)   [employer] of Occupational Health - Occupational Stress Questionnaire    Feeling of Stress: Not at all  Social Connections: Socially Integrated (01/15/2024)   Social Connection and Isolation Panel    Frequency of Communication with Friends and Family: More than three times a week    Frequency of Social Gatherings with Friends and Family: More than three times a week    Attends Religious Services: More than 4 times per year    Active Member of [employer] or Organizations: Yes    Attends Engineer, structural: More than 4 times per year    Marital Status: Married    Tobacco Counseling Counseling given: Not Answered Tobacco comments: smoking cessation materials not required    Clinical Intake:  Pre-visit preparation completed: Yes  Pain : No/denies pain     BMI - recorded: 28.98 Nutritional Status: BMI 25 -29 Overweight Nutritional Risks:  None Diabetes: No  Lab Results  Component Value Date   HGBA1C 6.1 (H) 04/24/2023   HGBA1C 5.7 (H) 04/17/2022   HGBA1C 5.9 (H) 11/07/2021     How often do you need to have someone help you when you read instructions, pamphlets, or other written materials from your doctor or pharmacy?: 1 - Never  Interpreter Needed?: No  Information entered by :: Vina Ned, CMA   Activities of Daily Living     01/15/2024    1:16 PM 06/28/2023    1:46 PM  In your present state of health, do you have any difficulty performing the following activities:  Hearing? 0 0  Vision? 0 0  Difficulty concentrating or making decisions? 1 0  Comment mild cognitive impairment   Walking or climbing stairs? 0   Dressing or bathing? 0   Doing errands, shopping? 0 0  Preparing Food and eating ? N   Using the Toilet? N   In the past six months, have you accidently leaked urine? N   Do you have problems with loss of bowel control? N   Managing your Medications? N   Managing your Finances? N   Housekeeping or managing your Housekeeping? N     Patient Care Team: Justus Leita DEL, MD as PCP - General (Internal Medicine) Darron Deatrice LABOR, MD as PCP - Cardiology (Cardiology) Francisca Redell BROCKS, MD as Consulting Physician (Urology) Cathlyn Seal, MD (Dermatology) Verlinda Boas, PA-C (Orthopedic Surgery) Darron Deatrice LABOR, MD as Consulting Physician (Cardiology) Pa, [facility] (Optometry)  I have updated your Care Teams any recent Medical Services you may have received from other providers in the past year.     Assessment:   This is a routine wellness examination for [place].  Hearing/Vision screen Hearing Screening - Comments:: Denies hearing loss Vision Screening - Comments:: Gets routine  eye exams, [facility], [place]   Goals Addressed               This Visit's Progress     Increase physical activity (pt-stated)        Get back to walking on a routine basis       Depression  Screen     01/15/2024    1:24 PM 12/02/2023    3:01 PM 09/30/2023    8:14 AM 04/24/2023    9:43 AM 04/18/2023    8:16 AM 01/09/2023   10:38 AM 12/27/2022    9:23 AM  PHQ 2/9 Scores  PHQ - 2 Score 0 1 1 0 0 0 0  PHQ- 9 Score 5 6 4 3   0 0    Fall Risk     01/15/2024    1:30 PM 12/02/2023    3:01 PM 09/30/2023    8:14 AM 05/13/2023   12:54 PM 04/24/2023    9:43 AM  Fall Risk   Falls in the past year? 0 0 0 0 0  Number falls in past yr: 0 0 0 0 0  Injury with Fall? 0 0 0  0  Risk for fall due to : No Fall Risks No Fall Risks No Fall Risks No Fall Risks No Fall Risks  Follow up Falls evaluation completed Falls evaluation completed Falls evaluation completed Falls evaluation completed Falls evaluation completed    MEDICARE RISK AT HOME:  Medicare Risk at Home Any stairs in or around the home?: Yes If so, are there any without handrails?: No Home free of loose throw rugs in walkways, pet beds, electrical cords, etc?: Yes Adequate lighting in your home to reduce risk of falls?: Yes Life alert?: No Use of a cane, walker or w/c?: No Grab bars in the bathroom?: Yes Shower chair or bench in shower?: No Elevated toilet seat or a handicapped toilet?: Yes  TIMED UP AND GO:  Was the test performed?  No  Cognitive Function: 6CIT completed        01/15/2024    1:31 PM 12/27/2022    9:29 AM 11/07/2021    8:43 AM 12/09/2019    1:47 PM 12/03/2018    9:44 AM  6CIT Screen  What Year? 0 points 0 points 0 points 0 points 0 points  What month? 0 points 0 points 0 points 0 points 0 points  What time? 0 points 0 points 0 points 0 points 0 points  Count back from 20 0 points 0 points 0 points 0 points 0 points  Months in reverse 4 points 4 points 0 points 0 points 0 points  Repeat phrase 0 points 0 points 6 points 2 points 4 points  Total Score 4 points 4 points 6 points 2 points 4 points    Immunizations Immunization History  Administered Date(s) Administered   Fluad Quad(high Dose 65+)  03/31/2019, 04/11/2020, 04/13/2021, 04/17/2022   Fluad Trivalent(High Dose 65+) 04/24/2023   Influenza,inj,Quad PF,6+ Mos 06/19/2017, 03/28/2018   PFIZER(Purple Top)SARS-COV-2 Vaccination 10/01/2019, 10/27/2019   Pneumococcal Conjugate-13 03/28/2018   Pneumococcal Polysaccharide-23 12/09/2019   Tdap 06/19/2017    Screening Tests Health Maintenance  Topic Date Due   Zoster Vaccines- Shingrix  (1 of 2) Never done   COVID-19 Vaccine (3 - 2024-25 season) 03/24/2023   INFLUENZA VACCINE  02/21/2024   Medicare Annual Wellness (AWV)  01/14/2025   DTaP/Tdap/Td (2 - Td or Tdap) 06/20/2027   Colonoscopy  02/11/2029   Pneumococcal Vaccine: 50+ Years  Completed   Hepatitis C Screening  Completed   Hepatitis B Vaccines  Aged Out   HPV VACCINES  Aged Out   Meningococcal B Vaccine  Aged Out    Health Maintenance  Health Maintenance Due  Topic Date Due   Zoster Vaccines- Shingrix  (1 of 2) Never done   COVID-19 Vaccine (3 - 2024-25 season) 03/24/2023   Health Maintenance Items Addressed: See Nurse Notes at the end of this note  Additional Screening:  Vision Screening: Recommended annual ophthalmology exams for early detection of glaucoma and other disorders of the eye. Would you like a referral to an eye doctor? No    Dental Screening: Recommended annual dental exams for proper oral hygiene  Community Resource Referral / Chronic Care Management: CRR required this visit?  No   CCM required this visit?  No   Plan:    I have personally reviewed and noted the following in the patient's chart:   Medical and social history Use of alcohol, tobacco or illicit drugs  Current medications and supplements including opioid prescriptions. Patient is not currently taking opioid prescriptions. Functional ability and status Nutritional status Physical activity Advanced directives List of other physicians Hospitalizations, surgeries, and ER visits in previous 12 months Vitals Screenings to  include cognitive, depression, and falls Referrals and appointments  In addition, I have reviewed and discussed with patient certain preventive protocols, quality metrics, and best practice recommendations. A written personalized care plan for preventive services as well as general preventive health recommendations were provided to patient.   Vina Ned, CMA   01/15/2024   After Visit Summary: (MyChart) Due to this being a telephonic visit, the after visit summary with patients personalized plan was offered to patient via MyChart   Notes:  6 CIT Score - 4 Needs 2nd Shingrix  vaccine (pharmacy) Covid and flu vaccines in the fall

## 2024-01-31 ENCOUNTER — Other Ambulatory Visit: Payer: Self-pay | Admitting: Cardiovascular Disease

## 2024-02-05 ENCOUNTER — Other Ambulatory Visit: Payer: Self-pay | Admitting: Surgery

## 2024-02-10 ENCOUNTER — Telehealth: Payer: Self-pay

## 2024-02-10 NOTE — Telephone Encounter (Signed)
   Pre-operative Risk Assessment    Patient Name: Todd Weaver  DOB: 26-Oct-1950 MRN: [id number]   Date of last office visit: 01/03/24 RYAN DUNN, PA Date of next office visit: NONE   Request for Surgical Clearance    Procedure:  RELEASE, CARPAL TUNNEL, ENDOSCOPIC (Right: Wrist),   RELEASE, A1 PULLEY, FOR TRIGGER FINGER (Right: Middle Finger)  Date of Surgery:  Clearance 02/19/24                                Surgeon:  DR NORLEEN MALTOS Surgeon's Group or Practice Name:  [facility] Phone number:  [phone] Fax number:  [phone]   Type of Clearance Requested:   - Medical  - Pharmacy:  Hold Aspirin  and Clopidogrel  (Plavix )     Type of Anesthesia:  General    Additional requests/questions:    Signed, Lucie DELENA Ku   02/10/2024, 7:57 AM      Elnor Dorise BRAVO, NP  P Cv Div Preop Callback Request for pre-operative cardiac clearance:   1. What type of surgery is being performed? RELEASE, CARPAL TUNNEL, ENDOSCOPIC (Right: Wrist) RELEASE, A1 PULLEY, FOR TRIGGER FINGER (Right: Middle Finger)  2. When is this surgery scheduled? 02/19/2024   3. Type of clearance being requested (medical, pharmacy, both)? BOTH   4. Are there any medications that need to be held prior to surgery? ASA + CLOPIDOGREL   5. Practice name and name of physician performing surgery? Performing surgeon: Dr. NORLEEN MALTOS, MD Requesting clearance: Dorise Elnor, FNP-C     6. Anesthesia type (none, local, MAC, general)? GENERAL  7. What is the office phone and fax number?   Phone: [phone] Fax: [phone]  ATTENTION: Unable to create telephone message as per your standard workflow. Directed by [facility] providers to send requests for cardiac clearance to this pool for appropriate distribution to provider covering pre-operative clearances.  Dorise Elnor, MSN, APRN, FNP-C, CEN [facility] Peri-operative Services Nurse Practitioner Phone: [phone] 02/09/24 6:21 PM

## 2024-02-10 NOTE — Telephone Encounter (Signed)
-----   Message from Dorise CHARLENA Pereyra sent at 02/09/2024  6:20 PM EDT ----- Regarding: Request for pre-operative cardiac clearance Request for pre-operative cardiac clearance:  1. What type of surgery is being performed?  RELEASE, CARPAL TUNNEL, ENDOSCOPIC (Right: Wrist)  RELEASE, A1 PULLEY, FOR TRIGGER FINGER (Right: Middle Finger)   2. When is this surgery scheduled?  02/19/2024  3. Type of clearance being requested (medical, pharmacy, both)? BOTH   4. Are there any medications that need to be held prior to surgery? ASA + CLOPIDOGREL   5. Practice name and name of physician performing surgery?  Performing surgeon: Dr. Norleen Maltos, MD Requesting clearance: Dorise Pereyra, FNP-C    6. Anesthesia type (none, local, MAC, general)? GENERAL  7. What is the office phone and fax number?   Phone: [phone] Fax: [phone]  ATTENTION: Unable to create telephone message as per your standard workflow. Directed by [facility] providers to send requests for cardiac clearance to this pool for appropriate distribution to provider covering pre-operative clearances.   Dorise Pereyra, MSN, APRN, FNP-C, CEN [facility]  Peri-operative Services Nurse Practitioner Phone: [phone] 02/09/24 6:21 PM

## 2024-02-10 NOTE — Telephone Encounter (Signed)
 Per notes from Todd Weaver, Todd Weaver at time of office visit with patient on 01/03/24.   Preoperative cardiac risk stratification: He is planning to undergo carpal tunnel surgery.  He is without symptoms of angina or cardiac decompensation.  Recent echo showed low normal LV systolic function which was an improvement from prior.  Recent Lexiscan  MPI showed no evidence of ischemia or infarction and was overall low risk.  He is able to achieve greater than 4 METs per [facility] Activity Status Index without cardiac limitation.  He may proceed with noncardiac surgery at an overall low risk per RCRI without further cardiac testing.  He will need to hold clopidogrel  for 5 days prior to surgery with recommendation to resume as soon as safely possible in the postoperative timeframe at the discretion of his surgeon.  Aspirin  81 mg daily should be continued throughout the periprocedural timeframe.  I will forward clearance to requesting provider.  Rosaline EMERSON Bane, NP-C  02/10/2024, 9:50 AM [address], [address] [place], [place] [address] Office [phone] Fax [phone]

## 2024-02-11 ENCOUNTER — Other Ambulatory Visit: Payer: Self-pay

## 2024-02-11 ENCOUNTER — Encounter
Admission: RE | Admit: 2024-02-11 | Discharge: 2024-02-11 | Disposition: A | Discharge status: Home / Self Care | Source: Ambulatory Visit | Attending: Surgery | Admitting: Surgery

## 2024-02-11 DIAGNOSIS — I480 Paroxysmal atrial fibrillation: Secondary | ICD-10-CM

## 2024-02-11 DIAGNOSIS — G5601 Carpal tunnel syndrome, right upper limb: Secondary | ICD-10-CM

## 2024-02-11 DIAGNOSIS — I10 Essential (primary) hypertension: Secondary | ICD-10-CM

## 2024-02-11 DIAGNOSIS — I25118 Atherosclerotic heart disease of native coronary artery with other forms of angina pectoris: Secondary | ICD-10-CM

## 2024-02-11 DIAGNOSIS — Z01812 Encounter for preprocedural laboratory examination: Secondary | ICD-10-CM

## 2024-02-11 NOTE — Patient Instructions (Addendum)
 Your procedure is scheduled on: 02/19/2024 Wednesday Report to the Registration Desk on the 1st floor of [facility]. To find out your arrival time, please call [phone] between 1PM - 3PM on: 02/18/2024 Tuesday If your arrival time is 6:00 am, do not arrive before that time as the [facility] entrance doors do not open until 6:00 am.  REMEMBER: Instructions that are not followed completely may result in serious medical risk, up to and including death; or upon the discretion of your surgeon and anesthesiologist your surgery may need to be rescheduled.  Do not eat food after midnight the night before surgery.  No gum chewing or hard candies.  You may however, drink CLEAR liquids up to 2 hours before you are scheduled to arrive for your surgery. Do not drink anything within 2 hours of your scheduled arrival time.  Clear liquids include: - water  - apple juice without pulp - gatorade (not RED colors) - black coffee or tea (Do NOT add milk or creamers to the coffee or tea) Do NOT drink anything that is not on this list.   In addition, your doctor has ordered for you to drink the provided:  Ensure Pre-Surgery Clear Carbohydrate Drink   Drinking this carbohydrate drink up to two hours before surgery helps to reduce insulin resistance and improve patient outcomes. Please complete drinking 2 hours before scheduled arrival time.  One week prior to surgery: Stop Anti-inflammatories (NSAIDS) such as Advil, Aleve, Ibuprofen, Motrin, Naproxen, Naprosyn and Aspirin  based products such as Excedrin, Goody's Powder, BC Powder. Stop ANY OVER THE COUNTER supplements until after surgery.  You may however, continue to take Tylenol  if needed for pain up until the day of surgery.   Hold Plavix  5 days prior to your hand surgery and resume once procedure is completed.           Last dose is July 24  Continue Aspirin  81 mg as prescribed.   Continue taking all of your other prescription  medications up until the day of surgery.  ON THE DAY OF SURGERY ONLY TAKE THESE MEDICATIONS WITH SIPS OF WATER:  aspirin  EC 81 carvedilol  (COREG ) ezetimibe  (ZETIA )   4.   tamsulosin  (FLOMAX )   No Alcohol for 24 hours before or after surgery.  No Smoking including e-cigarettes for 24 hours before surgery.  No chewable tobacco products for at least 6 hours before surgery.  No nicotine patches on the day of surgery.  Do not use any recreational drugs for at least a week (preferably 2 weeks) before your surgery.  Please be advised that the combination of cocaine and anesthesia may have negative outcomes, up to and including death. If you test positive for cocaine, your surgery will be cancelled.  On the morning of surgery brush your teeth with toothpaste and water, you may rinse your mouth with mouthwash if you wish. Do not swallow any toothpaste or mouthwash.  Use CHG Soap or wipes as directed on instruction sheet.-provided for you  Do not wear jewelry, make-up, hairpins, clips or nail polish.  Do not wear lotions, powders, or perfumes.   Do not shave body hair from the neck down 48 hours before surgery.  Contact lenses, hearing aids and dentures may not be worn into surgery.  Do not bring valuables to the hospital. [facility] is not responsible for any missing/lost belongings or valuables.     Notify your doctor if there is any change in your medical condition (cold, fever, infection).  Wear comfortable clothing (specific to your surgery type) to the hospital.  After surgery, you can help prevent lung complications by doing breathing exercises.  Take deep breaths and cough every 1-2 hours. Your doctor may order a device called an Incentive Spirometer to help you take deep breaths.  If you are being admitted to the hospital overnight, leave your suitcase in the car. After surgery it may be brought to your room.  In case of increased patient census, it may be necessary  for you, the patient, to continue your postoperative care in the Same Day Surgery department.  If you are being discharged the day of surgery, you will not be allowed to drive home. You will need a responsible individual to drive you home and stay with you for 24 hours after surgery.    Please call the Pre-admissions Testing Dept. at [phone] if you have any questions about these instructions.  Surgery Visitation Policy:  Patients having surgery or a procedure may have two visitors.  Children under the age of 66 must have an adult with them who is not the patient.   Merchandiser, retail to address health-related social needs:  [email or web address]      Preparing for Surgery with CHLORHEXIDINE  GLUCONATE (CHG) Soap  Chlorhexidine  Gluconate (CHG) Soap  o An antiseptic cleaner that kills germs and bonds with the skin to continue killing germs even after washing  o Used for showering the night before surgery and morning of surgery  Before surgery, you can play an important role by reducing the number of germs on your skin.  CHG (Chlorhexidine  gluconate) soap is an antiseptic cleanser which kills germs and bonds with the skin to continue killing germs even after washing.  Please do not use if you have an allergy to CHG or antibacterial soaps. If your skin becomes reddened/irritated stop using the CHG.  1. Shower the NIGHT BEFORE SURGERY and the MORNING OF SURGERY with CHG soap.  2. If you choose to wash your hair, wash your hair first as usual with your normal shampoo.  3. After shampooing, rinse your hair and body thoroughly to remove the shampoo.  4. Use CHG as you would any other liquid soap. You can apply CHG directly to the skin and wash gently with a scrungie or a clean washcloth.  5. Apply the CHG soap to your body only from the neck down. Do not use on open wounds or open sores. Avoid contact with your eyes, ears, mouth, and genitals (private  parts). Wash face and genitals (private parts) with your normal soap.  6. Wash thoroughly, paying special attention to the area where your surgery will be performed.  7. Thoroughly rinse your body with warm water.  8. Do not shower/wash with your normal soap after using and rinsing off the CHG soap.  9. Pat yourself dry with a clean towel.  10. Wear clean pajamas to bed the night before surgery.  12. Place clean sheets on your bed the night of your first shower and do not sleep with pets.  13. Shower again with the CHG soap on the day of surgery prior to arriving at the hospital.  14. Do not apply any deodorants/lotions/powders.  15. Please wear clean clothes to the hospital.

## 2024-02-13 ENCOUNTER — Encounter: Payer: Self-pay | Admitting: Urgent Care

## 2024-02-13 ENCOUNTER — Encounter
Admission: RE | Admit: 2024-02-13 | Discharge: 2024-02-13 | Disposition: A | Discharge status: Home / Self Care | Source: Ambulatory Visit | Attending: Surgery | Admitting: Surgery

## 2024-02-13 DIAGNOSIS — I25118 Atherosclerotic heart disease of native coronary artery with other forms of angina pectoris: Secondary | ICD-10-CM | POA: Insufficient documentation

## 2024-02-13 DIAGNOSIS — R001 Bradycardia, unspecified: Secondary | ICD-10-CM | POA: Insufficient documentation

## 2024-02-13 DIAGNOSIS — I480 Paroxysmal atrial fibrillation: Secondary | ICD-10-CM | POA: Insufficient documentation

## 2024-02-13 DIAGNOSIS — Z01818 Encounter for other preprocedural examination: Secondary | ICD-10-CM | POA: Diagnosis present

## 2024-02-13 DIAGNOSIS — Z01812 Encounter for preprocedural laboratory examination: Secondary | ICD-10-CM

## 2024-02-13 DIAGNOSIS — I10 Essential (primary) hypertension: Secondary | ICD-10-CM

## 2024-02-13 LAB — BASIC METABOLIC PANEL WITH GFR
Anion gap: 8 (ref 5–15)
BUN: 13 mg/dL (ref 8–23)
CO2: 27 mmol/L (ref 22–32)
Calcium: 9.1 mg/dL (ref 8.9–10.3)
Chloride: 104 mmol/L (ref 98–111)
Creatinine, Ser: 0.96 mg/dL (ref 0.61–1.24)
GFR, Estimated: >60 mL/min (ref >60)
Glucose, Bld: 96 mg/dL (ref 70–99)
Potassium: 3.9 mmol/L (ref 3.5–5.1)
Sodium: 139 mmol/L (ref 135–145)

## 2024-02-13 LAB — CBC
HCT: 39.9 % (ref 39.0–52.0)
Hemoglobin: 13.4 g/dL (ref 13.0–17.0)
MCH: 27.2 pg (ref 26.0–34.0)
MCHC: 33.6 g/dL (ref 30.0–36.0)
MCV: 81.1 fL (ref 80.0–100.0)
Platelets: 237 K/uL (ref 150–400)
RBC: 4.92 MIL/uL (ref 4.22–5.81)
RDW: 15.5 % (ref 11.5–15.5)
WBC: 6 K/uL (ref 4.0–10.5)
nRBC: 0 % (ref 0.0–0.2)

## 2024-02-17 ENCOUNTER — Encounter: Payer: Self-pay | Admitting: Surgery

## 2024-02-17 NOTE — Progress Notes (Signed)
 Perioperative / Anesthesia Services  Pre-Admission Testing Clinical Review / Pre-Operative Anesthesia Consult  Date: 02/17/24  PATIENT DEMOGRAPHICS: Name: Todd Weaver DOB: 1950-10-01 MRN:   [id number]  Note: Available PAT nursing documentation and vital signs have been reviewed. Clinical nursing staff has updated patient's PMH/PSHx, current medication list, and drug allergies/intolerances to ensure complete and comprehensive history available to assist care teams in MDM as it pertains to the aforementioned surgical procedure and anticipated anesthetic course. Extensive review of available clinical information personally performed. [place] [facility] and PSHx updated with any diagnoses/procedures that  may have been inadvertently omitted during his intake with the pre-admission testing department's nursing staff.  PLANNED SURGICAL PROCEDURE(S):   Case: [id number] Date/Time: 02/19/24 0930   Procedures:      RELEASE, CARPAL TUNNEL, ENDOSCOPIC (Right: Wrist)     RELEASE, A1 PULLEY, FOR TRIGGER FINGER (Right: Middle Finger)   Anesthesia type: Choice   Diagnosis:      Carpal tunnel syndrome of right wrist [G56.01]     Trigger finger, right middle finger [M65.331]   Pre-op diagnosis:      Carpal tunnel syndrome, right G56.01     Trigger finger, right middle finger M65.331   Location: [facility] OR ROOM 02 / [facility]   Surgeons: Edie Norleen JINNY, MD        CLINICAL DISCUSSION: Todd Weaver is a 73 y.o. male who is submitted for pre-surgical anesthesia review and clearance prior to him undergoing the above procedure. Patient is a Former Smoker (quit 07/2007). Pertinent PMH includes: CAD, inferior STEMI with ventricular fibrillation cardiac arrest, ischemic cardiomyopathy, combined systolic and diastolic HF, PAF, CVA, PAD, DVT, , bradycardia, angina, HTN, HLD, GERD (no daily Tx), thrombophilia, OA, BILATERAL carpal tunnel syndrome, cervical spinal stenosis (s/p ACDF C4-C6), lumbar  spondylosis with myelopathy, ED (on PDE5i), BPH, mild cognitive impairment, ETOH use.   Patient is followed by cardiology Marsa, MD). He was last seen in the cardiology clinic on 01/03/2024; notes reviewed. At the time of his clinic visit, patient doing well overall from a cardiovascular perspective. Patient denied any chest pain, shortness of breath, PND, orthopnea, palpitations, significant peripheral edema, weakness, fatigue, vertiginous symptoms, or presyncope/syncope. Patient with a past medical history significant for cardiovascular diagnoses. Documented physical exam was grossly benign, providing no evidence of acute exacerbation and/or decompensation of the patient's known cardiovascular conditions.  Patient suffered a CVA on 09/26/2005. MRI imaging of the brain at that time revealed tiny ischemic infarction secondary to a shower of emboli and a watershed ischemic pattern noted to the posterior frontal, mid parietal lobe, and also's right occipital lobe.  Patient has no significant neurological deficits following this event per his report.  Patient suffered an inferior wall STEMI on 07/18/2017.     In the setting of his inferior STEMI, patient developed ventricular fibrillation cardiac arrest requiring defibrillation x 1 in the field by EMS and then again upon presentation to the emergency department.  Defibrillation x 2 to restored stable perfusing rhythm.    He underwent diagnostic LEFT heart catheterization on 07/18/2017 revealing multivessel CAD; 99% ostial to mid RCA, 60% post atrial, 85% distal LM-ostial LAD, 40% proximal LCx, 60% proximal LAD, and 40% mid LAD.  PCI was performed placing overlapping 3.0 x 38 mm and 3.25 x 18 mm Xience Sierra stents x 2 to the RCA yielding excellent angiographic result and TIMI-3 flow.     During his hospitalization for his STEMI, patient developed a brief episode of paroxysmal  atrial fibrillation.  Atrial dysrhythmia was treated with intravenous  amiodarone , which restored NSR.  Medication was discontinued prior to discharge. Patient was not started on long-term oral anticoagulation therapy. Per his reports, he has not had an known recurrence of his atrial arrhythmia.    Repeat diagnostic LEFT heart catheterization on 08/19/2017 revealing multivessel CAD; 85% distal LM-ostial LAD, 60% proximal LAD, 40% mid LAD, 40% proximal LCx, and 50% ostial ramus intermedius.  Previously placed stents were noted be widely patent.  PTCA was performed.  Plans are for staged PCI.   Patient underwent staged PCI procedure on 09/09/2017 placing a 3.0 x 38 mm Xience Sierra DES to the distal LM-ostial LAD lesion yielding excellent angiographic result and TIMI-3 flow.   Repeat diagnostic LEFT heart catheterization was performed on 11/07/2017 revealing multivessel CAD; 30% mid LAD, 40% proximal LCx, 70% ostial ramus intermedius, and 40% distal LAD.  Previously placed stents noted to be widely patent.  Further intervention was deferred opting for medical management.   Most recent cardiac catheterization was performed on 10/17/2021 revealing multivessel CAD; 70% ostial ramus intermedius, 20% mid LAD, and 30% proximal LCx.  Again, all previously placed stents were noted to be widely patent.  Further intervention was deferred opting for continued aggressive medical management for coronary artery disease.   Long-term cardiac event monitor study was performed on 01/05/2022 revealing a predominant underlying sinus rhythm with an average rate of 66 bpm; range 42-136 bpm.  There was 1 run of SVT lasting 6 beats with a maximum rate of 145 bpm.  Rare atrial and ventricular ectopy with a total study burden of <1% noted.  There were no significant arrhythmias or pauses noted.   Most recent myocardial perfusion imaging study was performed on 11/15/2023 revealing a low normal left ventricular systolic function with an EF of 50%.  There were no regional wall motion abnormalities.  No  artifact or left ventricular cavity size enlargement appreciated on review of imaging. SPECT images demonstrated no evidence of stress-induced myocardial ischemia or arrhythmia; no scintigraphic evidence of scar.  TID ratio = 1.01. Study determined to be normal and low risk.  Most recent TTE performed on 04/18/2022 revealed a low normal left ventricular systolic function with an EF of 50-55%. There were no regional wall motion abnormalities.  Left ventricular diastolic parameters were normal. Right ventricular size and function normal with a TAPSE measuring 2.0 cm  (normal range >/= 1.6 cm).  There was no significant valvular regurgitation.  All transvalvular gradients were noted to be normal providing no evidence of hemodynamically significant valvular stenosis. Aorta normal in size with no evidence of ectasia or aneurysmal dilatation.  Given his PVD and previous STEMI, patient remains on daily DAPT therapy (ASA + clopidogrel ).  Patient reportedly compliant with therapy with no evidence or reports of GI/GU related bleeding.  Ischemic cardiomyopathy with combined systolic and diastolic heart failure diagnosis being managed with GDMT regimen including beta-blocker (carvedilol ) and ARB/ARNI (Entresto ) therapies.  Patient is on rosuvastatin  + ezetimibe  for his HLD diagnosis and further ASCVD prevention.   Patient has a supply of short acting nitrates (NTG) to use on a as needed basis for recurrent angina/anginal equivalent symptoms; denied recent use. In the setting of known cardiovascular diagnoses, it is important note that patient is on a PDE5i medication (sildenafil ) for an erectile dysfunction diagnosis.  Patient is not diabetic.  He does not have an OSAH. Patient is able to complete all of his  ADL/IADLs without cardiovascular limitation.  Per  the DASI, patient is able to achieve at least 4 METS of physical activity without experiencing any significant degree of angina/anginal equivalent symptoms. No changes  were made to his medication regimen during his visit with cardiology.  Patient scheduled to follow-up with outpatient cardiology in 6 months or sooner if needed.  Todd Weaver is scheduled for an elective RELEASE, CARPAL TUNNEL, ENDOSCOPIC (Right: Wrist); RELEASE, A1 PULLEY, FOR TRIGGER FINGER (Right: Middle Finger) on 02/19/2024 with Dr. Norleen Maltos, MD. Given patient's past medical history significant for cardiovascular diagnoses, presurgical cardiac clearance was sought by the PAT team. Per cardiology, planning to undergo carpal tunnel surgery. He is without symptoms of angina or cardiac decompensation. Recent echo showed low normal LV systolic function which was an improvement from prior. Recent Lexiscan  MPI showed no evidence of ischemia or infarction and was overall low risk. He is able to achieve greater than 4 METs per [facility] Activity Status Index without cardiac limitation. He may proceed with noncardiac surgery at an overall LOW risk per RCRI without further cardiac testing.   Again, this patient is on daily DAPT therapy. He has been instructed on recommendations for holding his clopidogrel  for 5 days prior to his procedure with plans to restart as soon as postoperative bleeding risk felt to be minimized by his primary attending surgeon. The patient has been instructed that his last dose of should be on 02/13/2024. Given that patient's past medical history is significant for cardiovascular diagnoses, including but not limited to CAD, orthopedics has cleared patient to continue his daily low dose ASA throughout his perioperative course. He will be asked to hold his normal dose on the day of his procedure only. Patient has been updated on these directives from his specialty care providers by the PAT team.  Patient denies previous perioperative complications with anesthesia in the past. In review his EMR, it is noted that patient underwent a general anesthetic course here at [facility] (ASA III) in 06/2023 without documented complications.   MOST RECENT VITAL SIGNS:    01/15/2024    1:15 PM 01/03/2024   10:28 AM 12/18/2023    8:57 AM  Vitals with BMI  Height 5' 7 5' 7 5' 7  Weight 185 lbs 181 lbs 180 lbs 13 oz  BMI 28.97 28.34 28.31  Systolic -- 130 124  Diastolic -- 70 70  Pulse  56 86   PROVIDERS/SPECIALISTS: NOTE: Primary physician provider listed below. Patient may have been seen by APP or partner within same practice.   PROVIDER ROLE / SPECIALTY LAST OV  Poggi, Norleen JINNY, MD Orthopedics (Surgeon) 11/07/2023  Justus Leita DEL, MD Primary Care Provider 12/02/2023  Darron Grass, MD Cardiology 01/03/2024; preop APP call 02/10/2024  Marcelino Nurse, MD Pain Management 05/13/2023   ALLERGIES: Allergies  Allergen Reactions   Codeine Hives and Other (See Comments)   Shellfish Allergy Hives and Swelling   Contrast Media [Iodinated Contrast Media] Itching and Other (See Comments)    Whelts on tongue   Fish Allergy Itching    CURRENT HOME MEDICATIONS: No current facility-administered medications for this encounter.    acetaminophen  (TYLENOL ) 500 MG tablet   aspirin  EC 81 MG tablet   carvedilol  (COREG ) 3.125 MG tablet   clopidogrel  (PLAVIX ) 75 MG tablet   ezetimibe  (ZETIA ) 10 MG tablet   nitroGLYCERIN  (NITROSTAT ) 0.4 MG SL tablet   omeprazole  (PRILOSEC) 40 MG capsule   oxybutynin  (DITROPAN -XL) 10 MG 24 hr tablet   rosuvastatin  (CRESTOR ) 40  MG tablet   tamsulosin  (FLOMAX ) 0.4 MG CAPS capsule   sacubitril -valsartan  (ENTRESTO ) 49-51 MG   sildenafil  (REVATIO ) 20 MG tablet   HISTORY: Past Medical History:  Diagnosis Date   Acute ST elevation myocardial infarction (STEMI) of inferior wall (HCC) 07/18/2017   a.) LHC/PCI 07/18/2017: 99% o-mRCA --> overlapping 3.0 x 38 mm and 3.25 x 18 mm Xience Sierra DES   Alcohol dependence, daily use (HCC)    a.) 3-4 beers daily   Angina pectoris (HCC) 11/06/2017   Bilateral carpal tunnel  syndrome    BPH (benign prostatic hyperplasia) 06/19/2017   Bradycardia    Cardiac arrest with ventricular fibrillation (HCC) 07/18/2017   a.) in setting of acute inferior STEMI; required defib in field by EMS and again in the ED   Cervical spinal stenosis    a.) s/p ACDF C4-C6 08/22/2022   Chronic deep vein thrombosis (DVT) of brachial vein of left upper extremity (HCC)    Combined systolic and diastolic cardiac dysfunction 09/29/2019   a.) TTE 09/29/2019: EF 45-50%, glob HK, mild MR, mild PA dil, G2DD; b.) TTE 11/28/2021: EF 45-50%, glob HK, G2DD; c.) TTE 04/18/2022: EF 45-50%, glob HK, G2DD   Coronary artery disease of native artery of native heart with stable angina pectoris (HCC) 07/18/2017   a.) LHC/PCI 07/18/17: 99 o-mRCA (overlapping 3.0x18mm & 3.25x63mm Xience Sierra DES), 60 post atrio, 85 dLM-oLAD, 40 pLCx, 60 pLAD, 40 mLAD; b.) LHC 08/19/17: 85 dLM-oLAD (PTCA), 60 pLAD, 30 mLAD, 40 pLCx, 50 RI; c.) LHC/PCI 09/09/17: unchanged LHC. 3.0 x 28mm Xience Sierra DES dLM-oLAD; d.) LHC 11/07/17: 30 mLM, 40 pLCx, 70 RI, 40 dLAD - Rx mgmt; e.) LHC 10/17/21: 70 oRI, 20 mLAD, 30 pLCx - Rx mgmt   ED (erectile dysfunction) 06/19/2017   a.) on PDE5i (sildenafil )   Former smoker    GERD (gastroesophageal reflux disease)    Hemorrhoid    Hyperlipidemia    Hypertension    Impingement syndrome of left shoulder region 10/14/2018   Ischemic cardiomyopathy    a.) TTE 07/18/2017: EF 45-50%; b.) LHC 08/19/2017: EF 45%; c.) TTE 09/29/2019: EF 45-50%; d.) MPI 01/18/2021: EF 47%; e.) TTE 11/28/2021: EF 45-50%; f.) TTE 04/18/2022:  EF 45-50%   Long term current use of aspirin     Long term current use of clopidogrel     Lumbar spondylosis with myelopathy 11/08/2016   MCI (mild cognitive impairment) 06/19/2017   Myelomalacia of cervical cord (HCC)    PAD (peripheral artery disease) (HCC)    a. 01/2019 ABIs and Duplex: ABI R 0.78, L 0.79; TBI R 0.66, L 1.11. RCIA >50p, RSFA 50-74. LCIA >50p, LSFA 75-99p, 30-49d. 3  vessel runoff bilat.   PAF (paroxysmal atrial fibrillation) (HCC)    a.) brief episode during 06/2017 hospitalization for inferior STEMI --> converted to NSR with IV amiodarone  --> not on [facility]   Rectal polyp    Stroke (HCC) 09/26/2005   a.) seen acutely on brain MRI 09/26/2005 --> posterior frontal, mid parietal lobe, RIGHT occipital lobe --> tiny ischemic infarctions secondary to a shower of emboli  (watershed ischemic pattern may be present)   Subareolar gynecomastia in male    Thrombophilia (HCC) 09/29/2020   Urinary hesitancy 12/26/2020   Past Surgical History:  Procedure Laterality Date   ANAL FISTULECTOMY N/A 12/21/2014   Procedure: FISTULECTOMY ANAL;  Surgeon: Louanne KANDICE Muse, MD;  Location: [facility] ORS;  Service: General;  Laterality: N/A;   ANTERIOR CERVICAL DECOMP/DISCECTOMY FUSION N/A 08/22/2022   Procedure: C4-6 ANTERIOR  CERVICAL DISCECTOMY AND FUSION (NUVASIVE ACP, GLOBUS HEDRON);  Surgeon: Clois Fret, MD;  Location: [facility] ORS;  Service: Neurosurgery;  Laterality: N/A;   APPENDECTOMY  1975   CARDIAC CATHETERIZATION     CARPAL TUNNEL RELEASE Left 07/04/2023   Procedure: CARPAL TUNNEL RELEASE ENDOSCOPIC;  Surgeon: Edie Norleen PARAS, MD;  Location: [facility] ORS;  Service: Orthopedics;  Laterality: Left;   COLONOSCOPY WITH PROPOFOL  N/A 02/12/2019   Procedure: COLONOSCOPY WITH PROPOFOL ;  Surgeon: Jinny Carmine, MD;  Location: [facility] ENDOSCOPY;  Service: Endoscopy;  Laterality: N/A;   CORONARY STENT INTERVENTION N/A 09/09/2017   Procedure: CORONARY STENT INTERVENTION;  Surgeon: Darron Deatrice LABOR, MD;  Location: [facility] INVASIVE CV LAB;  Service: Cardiovascular;  Laterality: N/A;   CORONARY ULTRASOUND/IVUS N/A 09/09/2017   Procedure: Intravascular Ultrasound/IVUS;  Surgeon: Darron Deatrice LABOR, MD;  Location: [facility]VAS[facility] CV LAB;  Service: Cardiovascular;  Laterality: N/A;   CORONARY/GRAFT ACUTE MI REVASCULARIZATION N/A 07/18/2017   Procedure: Coronary/Graft Acute MI Revascularization;   Surgeon: Darron Deatrice LABOR, MD;  Location: [facility] INVAS[facility] CV LAB;  Service: Cardiovascular;  Laterality: N/A;   HEMORRHOID SURGERY N/A 12/21/2014   Procedure: HEMORRHOIDECTOMY;  Surgeon: Louanne KANDICE Muse, MD;  Location: [facility] ORS;  Service: General;  Laterality: N/A;   KNEE SURGERY Right age 71   LEFT HEART CATH AND CORONARY ANGIOGRAPHY N/A 07/18/2017   Procedure: LEFT HEART CATH AND CORONARY ANGIOGRAPHY;  Surgeon: Darron Deatrice LABOR, MD;  Location: [facility] CV LAB;  Service: Cardiovascular;  Laterality: N/A;   LEFT HEART CATH AND CORONARY ANGIOGRAPHY Left 08/19/2017   Procedure: LEFT HEART CATH AND CORONARY ANGIOGRAPHY;  Surgeon: Darron Deatrice LABOR, MD;  Location: [facility] LAB;  Service: Cardiovascular;  Laterality: Left;   LEFT HEART CATH AND CORONARY ANGIOGRAPHY N/A 11/07/2017   Procedure: LEFT HEART CATH AND CORONARY ANGIOGRAPHY;  Surgeon: Darron Deatrice LABOR, MD;  Location: [facility] CV LAB;  Service: Cardiovascular;  Laterality: N/A;   LEFT HEART CATH AND CORONARY ANGIOGRAPHY N/A 10/17/2021   Procedure: LEFT HEART CATH AND CORONARY ANGIOGRAPHY;  Surgeon: Darron Deatrice LABOR, MD;  Location: [facility] INVASIVE CV LAB;  Service: Cardiovascular;  Laterality: N/A;   ROTATOR CUFF REPAIR Right 2013   Family History  Problem Relation Age of Onset   Hypertension Mother    Alzheimer's disease Mother    Hypertension Father    Alzheimer's disease Father    Breast cancer Neg Hx    Social History   Tobacco Use   Smoking status: Former    Current packs/day: 0.00    Average packs/day: 1 pack/day for 38.0 years (38.0 ttl pk-yrs)    Types: Cigarettes    Start date: 28    Quit date: 07/24/2007    Years since quitting: 16.5    Passive exposure: Past   Smokeless tobacco: Former    Types: Chew    Quit date: 2005   Tobacco comments:    smoking cessation materials not required  Substance Use Topics   Alcohol use: Yes    Alcohol/week: 7.0 - 14.0 standard drinks of alcohol    Types: 7 - 14  Cans of beer per week    Comment: 1-2 beers daily   LABS:  Hospital Outpatient Visit on 02/13/2024  Component Date Value Ref Range Status   Sodium 02/13/2024 139  135 - 145 mmol/L Final   Potassium 02/13/2024 3.9  3.5 - 5.1 mmol/L Final   Chloride 02/13/2024 104  98 - 111 mmol/L Final   CO2 02/13/2024 27  22 -  32 mmol/L Final   Glucose, Bld 02/13/2024 96  70 - 99 mg/dL Final   Glucose reference range applies only to samples taken after fasting for at least 8 hours.   BUN 02/13/2024 13  8 - 23 mg/dL Final   Creatinine, Ser 02/13/2024 0.96  0.61 - 1.24 mg/dL Final   Calcium  02/13/2024 9.1  8.9 - 10.3 mg/dL Final   GFR, Estimated 02/13/2024 >60  >60 mL/min Final   Comment: (NOTE) Calculated using the CKD-EPI Creatinine Equation (2021)    Anion gap 02/13/2024 8  5 - 15 Final   Performed at [facility], [address] [place], [place] [address]   WBC 02/13/2024 6.0  4.0 - 10.5 K/uL Final   RBC 02/13/2024 4.92  4.22 - 5.81 MIL/uL Final   Hemoglobin 02/13/2024 13.4  13.0 - 17.0 g/dL Final   HCT 92/75/7974 39.9  39.0 - 52.0 % Final   MCV 02/13/2024 81.1  80.0 - 100.0 fL Final   MCH 02/13/2024 27.2  26.0 - 34.0 pg Final   MCHC 02/13/2024 33.6  30.0 - 36.0 g/dL Final   RDW 92/75/7974 15.5  11.5 - 15.5 % Final   Platelets 02/13/2024 237  150 - 400 K/uL Final   nRBC 02/13/2024 0.0  0.0 - 0.2 % Final   Performed at [facility], [address] [place], [place] [address]    ECG: Date: 02/13/2024  Time ECG obtained: 0859 AM Rate: 56 bpm Rhythm: sinus bradycardia Axis (leads I and aVF): normal Intervals: PR 154 ms. QRS 98 ms. QTc 413 ms. ST segment and T wave changes: Nonspecific ST abnormality Evidence of a possible, age undetermined, prior infarct:  No Comparison: Similar to previous tracing obtained on 11/06/2023   IMAGING / PROCEDURES: TRANSTHORACIC ECHOCARDIOGRAM performed on 12/18/2023 Left ventricular ejection fraction, by estimation, is 50 to 55%. The  left ventricle has low normal function. The left ventricle has no regional  wall motion abnormalities. Left ventricular diastolic parameters were normal.  Right ventricular systolic function is normal. The right ventricular size is normal.  The mitral valve is normal in structure. No evidence of mitral valve regurgitation.  The aortic valve is grossly normal. Aortic valve regurgitation is not visualized.  The inferior vena cava is normal in size with greater than 50% respiratory variability, suggesting right atrial pressure of 3 mmHg.   MYOCARDIAL PERFUSION IMAGING STUDY (LEXISCAN ) performed on 11/15/2023 Low normal left ventricular systolic function with an EF of 50% No regional wall motion abnormalities No evidence of stress-induced myocardial ischemia or arrhythmia. TID ratio = 1.01 Normal liver study  MR CERVICAL SPINE WO CONTRAST performed on 03/05/2023 No change in the appearance of the cervical spine since the prior MRI. Status post C4-6 discectomy and fusion. The central canal is open at both levels. Uncovertebral spurring causes marked bilateral foraminal narrowing at C5-6. Mild bilateral foraminal narrowing C3-4 and C7-T1. Mild to moderate foraminal narrowing at C6-7 is worse on the right.   LONG TERM CARDIAC EVENT MONITOR STUDY performed on  01/05/2022 Patch Wear Time:  13 days and 23 hours   Patient had a min HR of 42 bpm, max HR of 136 bpm, and avg HR of 66 bpm.  Predominant underlying rhythm was Sinus Rhythm.   1 run of Supraventricular Tachycardia occurred lasting 6 beats with a max rate of 135 bpm (avg 105 bpm). Rare PACs and rare PVCs with a burden of less than 1%. No significant arrhythmia overall.   LEFT HEART CATHETERIZATION  AND CORONARY ANGIOGRAPHY performed on 10/17/2021 Normal left ventricular systolic function with an EF of 45-50% Mildly elevated LVEDP = 23 mmHg Widely patent RCA and LAD stents with no significant ISR Multivessel CAD 70% ostial ramus  intermedius 20% mid LAD 30% proximal LCx Recommendations No culprit lesion identified from patient's symptoms Continue aggressive medical therapy for coronary artery disease     IMPRESSION AND PLAN: Todd Weaver has been referred for pre-anesthesia review and clearance prior to him undergoing the planned anesthetic and procedural courses. Available labs, pertinent testing, and imaging results were personally reviewed by me in preparation for upcoming operative/procedural course. [facility] medical record has been updated following extensive record review and patient interview with PAT staff.   This patient has been appropriately cleared by cardiology with an overall LOW risk of patient experiencing significant perioperative cardiovascular complications. Based on clinical review performed today (02/17/24), barring any significant acute changes in the patient's overall condition, it is anticipated that he will be able to proceed with the planned surgical intervention. Any acute changes in clinical condition may necessitate his procedure being postponed and/or cancelled. Patient will meet with anesthesia team (MD and/or CRNA) on the day of his procedure for preoperative evaluation/assessment. Questions regarding anesthetic course will be fielded at that time.   Pre-surgical instructions were reviewed with the patient during his PAT appointment, and questions were fielded to satisfaction by PAT clinical staff. He has been instructed on which medications that he will need to hold prior to surgery, as well as the ones that have been deemed safe/appropriate to take on the day of his procedure. As part of the general education provided by PAT, patient made aware both verbally and in writing, that he would need to abstain from the use of any illegal substances during his perioperative course. He was advised that failure to follow the provided instructions could necessitate case cancellation or result in  serious perioperative complications up to and including death. Patient encouraged to contact PAT and/or his surgeon's office to discuss any questions or concerns that may arise prior to surgery; verbalized understanding.   Dorise Pereyra, MSN, APRN, FNP-C, CEN [facility]  Perioperative Services Nurse Practitioner Phone: [phone] Fax: [phone] 02/17/24 9:00 AM  NOTE: This note has been prepared using Dragon dictation software. Despite my best ability to proofread, there is always the potential that unintentional transcriptional errors may still occur from this process.

## 2024-02-18 MED ORDER — LACTATED RINGERS IV SOLN: INTRAVENOUS | Status: DC

## 2024-02-18 MED ORDER — ORAL CARE MOUTH RINSE: 15.0000 mL | Freq: Once | OROMUCOSAL | Status: AC

## 2024-02-18 MED ORDER — CEFAZOLIN SODIUM-DEXTROSE 2-4 GM/100ML-% IV SOLN
2.0000 g | INTRAVENOUS | Status: AC
  Administered 2024-02-19: 2 g via INTRAVENOUS

## 2024-02-18 MED ORDER — CHLORHEXIDINE GLUCONATE 0.12 % MT SOLN
15.0000 mL | Freq: Once | OROMUCOSAL | Status: AC
  Administered 2024-02-19: 15 mL via OROMUCOSAL

## 2024-02-18 NOTE — H&P (Incomplete Revision)
 History of Present Illness:  Todd Weaver is a 73 y.o. male who presents for history and physical for an upcoming right endoscopic carpal tunnel release and right long finger trigger finger release to be done by Dr. Edie on February 19, 2024. The patient saw Dr. Edie back in March after left carpal tunnel release. The patient's primary concern today is worsening right hand and wrist pain and paresthesias. The patient has a known history of right carpal tunnel syndrome. The patient feels that the symptoms have worsened over the past month or so. He is being awakened at night several times and has to shake out my hand to alleviate his symptoms. He he has been trying to wear splints at night without benefit. He also finds that the Neurontin  and pain medications are not helpful in alleviating his right hand symptoms.  Finally, the patient still notes an intermittent painful catching of his right long finger. He had received a steroid injection previously which he states provided some relief of his symptoms.  Current Outpatient Medications:  amLODIPine  (NORVASC ) 5 MG tablet Take by mouth  aspirin  81 MG chewable tablet Take 81 mg by mouth once daily  atorvastatin  (LIPITOR ) 80 MG tablet  carvediloL  (COREG ) 3.125 MG tablet  clopidogreL  (PLAVIX ) 75 mg tablet  ezetimibe  (ZETIA ) 10 mg tablet Take 10 mg by mouth once daily  FUROsemide  (LASIX ) 20 MG tablet furosemide  20 mg tablet  gabapentin  (NEURONTIN ) 300 MG capsule Increase gabapentin  to 300 mg twice daily, then increase to 300 mg in the morning and 600 mg at night. 90 capsule 2  losartan  (COZAAR ) 25 MG tablet  methocarbamoL  (ROBAXIN ) 500 MG tablet  metoprolol  succinate (TOPROL -XL) 25 MG XL tablet metoprolol  succinate ER 25 mg tablet,extended release 24 hr  nitroGLYcerin  (NITROSTAT ) 0.4 MG SL tablet nitroglycerin  0.4 mg sublingual tablet  omeprazole  (PRILOSEC) 40 MG DR capsule Take 40 mg by mouth once daily  oxyBUTYnin  (DITROPAN -XL) 10 MG XL tablet Take 10  mg by mouth at bedtime  rosuvastatin  (CRESTOR ) 40 MG tablet  SENNA 8.6 mg tablet  sennosides-docusate (SENOKOT-S) 8.6-50 mg tablet Take 1 tablet by mouth once daily  sildenafil  (REVATIO ) 20 mg tablet Take 20 mg by mouth 3 (three) times daily  tamsulosin  (FLOMAX ) 0.4 mg capsule Take 0.4 mg by mouth once daily Take 30 minutes after same meal each day.  potassium chloride  (KLOR-CON ) 10 MEQ ER tablet (Patient not taking: Reported on 02/17/2024)  sacubitriL -valsartan  (ENTRESTO ) 49-51 mg tablet Take 1 tablet by mouth 2 (two) times daily (Patient not taking: Reported on 02/17/2024)  spironolactone  (ALDACTONE ) 25 MG tablet (Patient not taking: Reported on 02/17/2024)  tiZANidine (ZANAFLEX) 2 MG tablet Take 1 tablet (2 mg total) by mouth at bedtime as needed (Patient not taking: Reported on 02/17/2024) 30 tablet 1   Allergies:  Shellfish Containing Products Hives and Swelling  Codeine Hives  Iodinated Contrast Media Itching  Whelts on tongue  Fish Containing Products Itching   Past Medical History:  BPH (benign prostatic hyperplasia) 06/19/2017  Carpal tunnel syndrome, bilateral 12/26/2020  Chronic deep vein thrombosis (DVT) of brachial vein of left upper extremity (CMS/HHS-HCC) 12/28/2021  Chronic systolic (congestive) heart failure (CMS/HHS-HCC) 09/29/2020  Contrast media allergy 12/12/2021  Coronary artery disease of native artery of native heart with stable angina pectoris () 10/03/2018  ED (erectile dysfunction) 06/19/2017  Essential hypertension 06/19/2017  GERD (gastroesophageal reflux disease) 12/12/2021  Hand dermatitis, unspecified 07/26/2014  Hemorrhoid 12/12/2021  Hx of transient ischemic attack (TIA) 07/23/2009  Impingement syndrome of left shoulder  region 10/14/2018  Ischemic cardiomyopathy 12/12/2021  Lumbar spondylosis with myelopathy 11/08/2016  MCI (mild cognitive impairment) 06/19/2017  Mixed hyperlipidemia 06/19/2017  Neck muscle spasm 12/26/2020  PAD (peripheral artery  disease) () 03/31/2019  PAF (paroxysmal atrial fibrillation) (CMS/HHS-HCC) 04/11/2020  Primary insomnia 06/18/2022  Rectal polyp  Thrombophilia (CMS/HHS-HCC) 09/29/2020  Urinary hesitancy 12/26/2020   Past Surgical History:  APPENDECTOMY 07/23/1973  ARTHROSCOPIC ROTATOR CUFF REPAIR Right 07/24/2011  FISTULECTOMY ANAL 12/21/2014  HEMORRHOID SURGERY 12/21/2014  CORONARY/GRAFT ACUTE MI REVASCULARIZATION 07/18/2017  LEFT HEART CATH AND CORONARY ANGIOGRAPHY 07/18/2017  LEFT HEART CATH AND CORONARY ANGIOGRAPHY Left 08/19/2017  CORONARY STENT INTERVENTION 09/09/2017  INTRAVASCULAR ULTRASOUND/IVUS 09/09/2017  LEFT HEART CATH AND CORONARY ANGIOGRAPHY 11/07/2017  COLONOSCOPY WITH PROPOFOL  02/12/2019  LEFT HEART CATH AND CORONARY ANGIOGRAPHY 10/17/2021  ENDOSCOPIC CARPAL TUNNEL RELEASE Left 07/04/2023  CARDIAC CATHETERIZATION  KNEE SURGERY Right (73 YEARS OLD)   Past Family History: No family history on file.   Social History:   Socioeconomic History:  Marital status: Married  Spouse name: Merlynn  Number of children: 2  Years of education: 11  Occupational History  Occupation: Retired- Insurance claims handler  Tobacco Use  Smoking status: Former  Types: Cigarettes  Smokeless tobacco: Never  Vaping Use  Vaping status: Never Used  Substance and Sexual Activity  Alcohol use: Yes  Comment: 2-3 beers a day  Drug use: Never  Sexual activity: Yes  Partners: Female   Social Drivers of Health:   Physicist, medical Strain: Low Risk (02/17/2024)  Overall Financial Resource Strain (CARDIA)  Difficulty of Paying Living Expenses: Not hard at all  Food Insecurity: No Food Insecurity (02/17/2024)  Hunger Vital Sign  Worried About Running Out of Food in the Last Year: Never true  Ran Out of Food in the Last Year: Never true  Transportation Needs: No Transportation Needs (02/17/2024)  PRAPARE - Risk analyst (Medical): No  Lack of Transportation (Non-Medical): No   Review  of Systems:  A comprehensive 14 point ROS was performed, reviewed, and the pertinent orthopaedic findings are documented in the HPI.  Physical Exam: Vitals:  02/17/24 0846 02/17/24 0847  BP: 132/84  Weight: 83.5 kg (184 lb)  Height: 170.2 cm (5' 7)  PainSc: 4 4  PainLoc: Hand Hand   General/Constitutional: The patient appears to be well-nourished, well-developed, and in no acute distress. Neuro/Psych: Normal mood and affect, oriented to person, place and time. Eyes: Non-icteric. Pupils are equal, round, and reactive to light, and exhibit synchronous movement. ENT: Unremarkable. Lymphatic: No palpable adenopathy. Respiratory: Lungs clear to auscultation, Normal chest excursion, No wheezes, and Non-labored breathing Cardiovascular: Regular rate and rhythm. No murmurs. and No edema, swelling or tenderness, except as noted in detailed exam. Integumentary: No impressive skin lesions present, except as noted in detailed exam. Musculoskeletal: Unremarkable, except as noted in detailed exam.  Right wrist/hand exam: Skin inspection of the right wrist and hand again is unremarkable. No swelling, erythema, ecchymosis, abrasions, or other skin abnormalities are identified. He denies tenderness to palpation of the dorsal or volar aspects of the wrist, nor does he have any tenderness palpation of the dorsal or palmar aspects of the hand, other than mild tenderness over the volar aspect of the long metacarpal head region. He can actively flex and extend his wrist fully without any pain or catching. He can actively flex and extend all digits fully, but does note mild intermittent catching of the right long finger, consistent with a right long trigger finger. He can oppose his  thumb to the base of the little finger. He is neurovascularly intact to all digits, other than subjectively decreased sensation to light touch to the thumb, index, and long fingers. He exhibits a positive Phalen's test, as well as an  equivocally positive Tinel's on the right.  Heart: Examination of the heart reveals regular, rate, and rhythm. There is no murmur noted on ascultation. There is a normal apical pulse.  Lungs: Lungs are clear to auscultation. There is no wheeze, rhonchi, or crackles. There is normal expansion of bilateral chest walls.   Assessment: 1. Carpal tunnel syndrome, right. 2. Trigger finger, right middle finger.   Plan: Regarding his right hand and wrist symptoms, the patient is becoming increasingly frustrated by his worsening symptoms and functional limitations. Therefore, I have recommended a surgical procedure, specifically an endoscopic right carpal tunnel release with release of his right long trigger finger. The procedure was discussed with the patient, as were the potential risks (including bleeding, infection, nerve and/or blood vessel injury, persistent or recurrent pain/paresthesias, recurrent catching of the right long finger, stiffness of the fingers, weakness of grip, need for further surgery, blood clots, strokes, heart attacks and/or arhythmias, pneumonia, etc.) and benefits. The patient states his understanding and wishes to proceed. All of the patient's questions and concerns were answered. He can call any time with further concerns. He will follow up post-surgery, routine.    H&P reviewed and patient re-examined. No changes.

## 2024-02-18 NOTE — H&P (Signed)
 History of Present Illness:  Todd Weaver is a 73 y.o. male who presents for history and physical for an upcoming right endoscopic carpal tunnel release and right long finger trigger finger release to be done by Dr. Edie on February 19, 2024. The patient saw Dr. Edie back in March after left carpal tunnel release. The patient's primary concern today is worsening right hand and wrist pain and paresthesias. The patient has a known history of right carpal tunnel syndrome. The patient feels that the symptoms have worsened over the past month or so. He is being awakened at night several times and has to shake out my hand to alleviate his symptoms. He he has been trying to wear splints at night without benefit. He also finds that the Neurontin  and pain medications are not helpful in alleviating his right hand symptoms.  Finally, the patient still notes an intermittent painful catching of his right long finger. He had received a steroid injection previously which he states provided some relief of his symptoms.  Current Outpatient Medications:  amLODIPine  (NORVASC ) 5 MG tablet Take by mouth  aspirin  81 MG chewable tablet Take 81 mg by mouth once daily  atorvastatin  (LIPITOR ) 80 MG tablet  carvediloL  (COREG ) 3.125 MG tablet  clopidogreL  (PLAVIX ) 75 mg tablet  ezetimibe  (ZETIA ) 10 mg tablet Take 10 mg by mouth once daily  FUROsemide  (LASIX ) 20 MG tablet furosemide  20 mg tablet  gabapentin  (NEURONTIN ) 300 MG capsule Increase gabapentin  to 300 mg twice daily, then increase to 300 mg in the morning and 600 mg at night. 90 capsule 2  losartan  (COZAAR ) 25 MG tablet  methocarbamoL  (ROBAXIN ) 500 MG tablet  metoprolol  succinate (TOPROL -XL) 25 MG XL tablet metoprolol  succinate ER 25 mg tablet,extended release 24 hr  nitroGLYcerin  (NITROSTAT ) 0.4 MG SL tablet nitroglycerin  0.4 mg sublingual tablet  omeprazole  (PRILOSEC) 40 MG DR capsule Take 40 mg by mouth once daily  oxyBUTYnin  (DITROPAN -XL) 10 MG XL tablet Take 10  mg by mouth at bedtime  rosuvastatin  (CRESTOR ) 40 MG tablet  SENNA 8.6 mg tablet  sennosides-docusate (SENOKOT-S) 8.6-50 mg tablet Take 1 tablet by mouth once daily  sildenafil  (REVATIO ) 20 mg tablet Take 20 mg by mouth 3 (three) times daily  tamsulosin  (FLOMAX ) 0.4 mg capsule Take 0.4 mg by mouth once daily Take 30 minutes after same meal each day.  potassium chloride  (KLOR-CON ) 10 MEQ ER tablet (Patient not taking: Reported on 02/17/2024)  sacubitriL -valsartan  (ENTRESTO ) 49-51 mg tablet Take 1 tablet by mouth 2 (two) times daily (Patient not taking: Reported on 02/17/2024)  spironolactone  (ALDACTONE ) 25 MG tablet (Patient not taking: Reported on 02/17/2024)  tiZANidine (ZANAFLEX) 2 MG tablet Take 1 tablet (2 mg total) by mouth at bedtime as needed (Patient not taking: Reported on 02/17/2024) 30 tablet 1   Allergies:  Shellfish Containing Products Hives and Swelling  Codeine Hives  Iodinated Contrast Media Itching  Whelts on tongue  Fish Containing Products Itching   Past Medical History:  BPH (benign prostatic hyperplasia) 06/19/2017  Carpal tunnel syndrome, bilateral 12/26/2020  Chronic deep vein thrombosis (DVT) of brachial vein of left upper extremity (CMS/HHS-HCC) 12/28/2021  Chronic systolic (congestive) heart failure (CMS/HHS-HCC) 09/29/2020  Contrast media allergy 12/12/2021  Coronary artery disease of native artery of native heart with stable angina pectoris () 10/03/2018  ED (erectile dysfunction) 06/19/2017  Essential hypertension 06/19/2017  GERD (gastroesophageal reflux disease) 12/12/2021  Hand dermatitis, unspecified 07/26/2014  Hemorrhoid 12/12/2021  Hx of transient ischemic attack (TIA) 07/23/2009  Impingement syndrome of left shoulder  region 10/14/2018  Ischemic cardiomyopathy 12/12/2021  Lumbar spondylosis with myelopathy 11/08/2016  MCI (mild cognitive impairment) 06/19/2017  Mixed hyperlipidemia 06/19/2017  Neck muscle spasm 12/26/2020  PAD (peripheral artery  disease) () 03/31/2019  PAF (paroxysmal atrial fibrillation) (CMS/HHS-HCC) 04/11/2020  Primary insomnia 06/18/2022  Rectal polyp  Thrombophilia (CMS/HHS-HCC) 09/29/2020  Urinary hesitancy 12/26/2020   Past Surgical History:  APPENDECTOMY 07/23/1973  ARTHROSCOPIC ROTATOR CUFF REPAIR Right 07/24/2011  FISTULECTOMY ANAL 12/21/2014  HEMORRHOID SURGERY 12/21/2014  CORONARY/GRAFT ACUTE MI REVASCULARIZATION 07/18/2017  LEFT HEART CATH AND CORONARY ANGIOGRAPHY 07/18/2017  LEFT HEART CATH AND CORONARY ANGIOGRAPHY Left 08/19/2017  CORONARY STENT INTERVENTION 09/09/2017  INTRAVASCULAR ULTRASOUND/IVUS 09/09/2017  LEFT HEART CATH AND CORONARY ANGIOGRAPHY 11/07/2017  COLONOSCOPY WITH PROPOFOL  02/12/2019  LEFT HEART CATH AND CORONARY ANGIOGRAPHY 10/17/2021  ENDOSCOPIC CARPAL TUNNEL RELEASE Left 07/04/2023  CARDIAC CATHETERIZATION  KNEE SURGERY Right (73 YEARS OLD)   Past Family History: No family history on file.   Social History:   Socioeconomic History:  Marital status: Married  Spouse name: Merlynn  Number of children: 2  Years of education: 11  Occupational History  Occupation: Retired- Insurance claims handler  Tobacco Use  Smoking status: Former  Types: Cigarettes  Smokeless tobacco: Never  Vaping Use  Vaping status: Never Used  Substance and Sexual Activity  Alcohol use: Yes  Comment: 2-3 beers a day  Drug use: Never  Sexual activity: Yes  Partners: Female   Social Drivers of Health:   Physicist, medical Strain: Low Risk (02/17/2024)  Overall Financial Resource Strain (CARDIA)  Difficulty of Paying Living Expenses: Not hard at all  Food Insecurity: No Food Insecurity (02/17/2024)  Hunger Vital Sign  Worried About Running Out of Food in the Last Year: Never true  Ran Out of Food in the Last Year: Never true  Transportation Needs: No Transportation Needs (02/17/2024)  PRAPARE - Risk analyst (Medical): No  Lack of Transportation (Non-Medical): No   Review  of Systems:  A comprehensive 14 point ROS was performed, reviewed, and the pertinent orthopaedic findings are documented in the HPI.  Physical Exam: Vitals:  02/17/24 0846 02/17/24 0847  BP: 132/84  Weight: 83.5 kg (184 lb)  Height: 170.2 cm (5' 7)  PainSc: 4 4  PainLoc: Hand Hand   General/Constitutional: The patient appears to be well-nourished, well-developed, and in no acute distress. Neuro/Psych: Normal mood and affect, oriented to person, place and time. Eyes: Non-icteric. Pupils are equal, round, and reactive to light, and exhibit synchronous movement. ENT: Unremarkable. Lymphatic: No palpable adenopathy. Respiratory: Lungs clear to auscultation, Normal chest excursion, No wheezes, and Non-labored breathing Cardiovascular: Regular rate and rhythm. No murmurs. and No edema, swelling or tenderness, except as noted in detailed exam. Integumentary: No impressive skin lesions present, except as noted in detailed exam. Musculoskeletal: Unremarkable, except as noted in detailed exam.  Right wrist/hand exam: Skin inspection of the right wrist and hand again is unremarkable. No swelling, erythema, ecchymosis, abrasions, or other skin abnormalities are identified. He denies tenderness to palpation of the dorsal or volar aspects of the wrist, nor does he have any tenderness palpation of the dorsal or palmar aspects of the hand, other than mild tenderness over the volar aspect of the long metacarpal head region. He can actively flex and extend his wrist fully without any pain or catching. He can actively flex and extend all digits fully, but does note mild intermittent catching of the right long finger, consistent with a right long trigger finger. He can oppose his  thumb to the base of the little finger. He is neurovascularly intact to all digits, other than subjectively decreased sensation to light touch to the thumb, index, and long fingers. He exhibits a positive Phalen's test, as well as an  equivocally positive Tinel's on the right.  Heart: Examination of the heart reveals regular, rate, and rhythm. There is no murmur noted on ascultation. There is a normal apical pulse.  Lungs: Lungs are clear to auscultation. There is no wheeze, rhonchi, or crackles. There is normal expansion of bilateral chest walls.   Assessment: 1. Carpal tunnel syndrome, right. 2. Trigger finger, right middle finger.   Plan: Regarding his right hand and wrist symptoms, the patient is becoming increasingly frustrated by his worsening symptoms and functional limitations. Therefore, I have recommended a surgical procedure, specifically an endoscopic right carpal tunnel release with release of his right long trigger finger. The procedure was discussed with the patient, as were the potential risks (including bleeding, infection, nerve and/or blood vessel injury, persistent or recurrent pain/paresthesias, recurrent catching of the right long finger, stiffness of the fingers, weakness of grip, need for further surgery, blood clots, strokes, heart attacks and/or arhythmias, pneumonia, etc.) and benefits. The patient states his understanding and wishes to proceed. All of the patient's questions and concerns were answered. He can call any time with further concerns. He will follow up post-surgery, routine.    H&P reviewed and patient re-examined. No changes.

## 2024-02-19 ENCOUNTER — Encounter: Payer: Self-pay | Admitting: Surgery

## 2024-02-19 ENCOUNTER — Other Ambulatory Visit: Payer: Self-pay

## 2024-02-19 ENCOUNTER — Ambulatory Visit: Payer: Self-pay | Admitting: Urgent Care

## 2024-02-19 ENCOUNTER — Ambulatory Visit
Admission: RE | Admit: 2024-02-19 | Discharge: 2024-02-19 | Disposition: A | Discharge status: Home / Self Care | Attending: Surgery | Admitting: Surgery

## 2024-02-19 ENCOUNTER — Encounter
Admission: RE | Disposition: A | Discharge status: Home / Self Care | Payer: Self-pay | Source: Home / Self Care | Attending: Surgery

## 2024-02-19 DIAGNOSIS — E782 Mixed hyperlipidemia: Secondary | ICD-10-CM | POA: Diagnosis not present

## 2024-02-19 DIAGNOSIS — Z8673 Personal history of transient ischemic attack (TIA), and cerebral infarction without residual deficits: Secondary | ICD-10-CM | POA: Insufficient documentation

## 2024-02-19 DIAGNOSIS — M65331 Trigger finger, right middle finger: Secondary | ICD-10-CM | POA: Diagnosis present

## 2024-02-19 DIAGNOSIS — I739 Peripheral vascular disease, unspecified: Secondary | ICD-10-CM | POA: Insufficient documentation

## 2024-02-19 DIAGNOSIS — Z860100 Personal history of colon polyps, unspecified: Secondary | ICD-10-CM | POA: Insufficient documentation

## 2024-02-19 DIAGNOSIS — I4901 Ventricular fibrillation: Secondary | ICD-10-CM | POA: Diagnosis not present

## 2024-02-19 DIAGNOSIS — I5042 Chronic combined systolic (congestive) and diastolic (congestive) heart failure: Secondary | ICD-10-CM | POA: Diagnosis not present

## 2024-02-19 DIAGNOSIS — G5601 Carpal tunnel syndrome, right upper limb: Secondary | ICD-10-CM | POA: Insufficient documentation

## 2024-02-19 DIAGNOSIS — I498 Other specified cardiac arrhythmias: Secondary | ICD-10-CM | POA: Insufficient documentation

## 2024-02-19 DIAGNOSIS — I491 Atrial premature depolarization: Secondary | ICD-10-CM | POA: Insufficient documentation

## 2024-02-19 DIAGNOSIS — Z7902 Long term (current) use of antithrombotics/antiplatelets: Secondary | ICD-10-CM | POA: Diagnosis not present

## 2024-02-19 DIAGNOSIS — I493 Ventricular premature depolarization: Secondary | ICD-10-CM | POA: Diagnosis not present

## 2024-02-19 DIAGNOSIS — Z955 Presence of coronary angioplasty implant and graft: Secondary | ICD-10-CM | POA: Diagnosis not present

## 2024-02-19 DIAGNOSIS — Z87891 Personal history of nicotine dependence: Secondary | ICD-10-CM | POA: Insufficient documentation

## 2024-02-19 DIAGNOSIS — I480 Paroxysmal atrial fibrillation: Secondary | ICD-10-CM | POA: Insufficient documentation

## 2024-02-19 DIAGNOSIS — I110 Hypertensive heart disease with heart failure: Secondary | ICD-10-CM | POA: Diagnosis not present

## 2024-02-19 DIAGNOSIS — Z86718 Personal history of other venous thrombosis and embolism: Secondary | ICD-10-CM | POA: Diagnosis not present

## 2024-02-19 DIAGNOSIS — I252 Old myocardial infarction: Secondary | ICD-10-CM | POA: Diagnosis not present

## 2024-02-19 DIAGNOSIS — Z7982 Long term (current) use of aspirin: Secondary | ICD-10-CM | POA: Diagnosis not present

## 2024-02-19 DIAGNOSIS — Z79899 Other long term (current) drug therapy: Secondary | ICD-10-CM | POA: Diagnosis not present

## 2024-02-19 DIAGNOSIS — I2510 Atherosclerotic heart disease of native coronary artery without angina pectoris: Secondary | ICD-10-CM | POA: Insufficient documentation

## 2024-02-19 HISTORY — PX: TRIGGER FINGER RELEASE: SHX641

## 2024-02-19 HISTORY — PX: CARPAL TUNNEL RELEASE: SHX101

## 2024-02-19 SURGERY — RELEASE, CARPAL TUNNEL, ENDOSCOPIC
Anesthesia: General | Site: Wrist | Laterality: Right

## 2024-02-19 MED ORDER — FENTANYL CITRATE (PF) 100 MCG/2ML IJ SOLN: 25.0000 ug | INTRAMUSCULAR | Status: DC | PRN

## 2024-02-19 MED ORDER — ACETAMINOPHEN 10 MG/ML IV SOLN
INTRAVENOUS | Status: DC | PRN
  Administered 2024-02-19: 1000 mg via INTRAVENOUS

## 2024-02-19 MED ORDER — HYDROCODONE-ACETAMINOPHEN 5-325 MG PO TABS: 1.0000 | ORAL_TABLET | Freq: Four times a day (QID) | ORAL | 0 refills | Status: DC | PRN

## 2024-02-19 MED ORDER — PROPOFOL 10 MG/ML IV BOLUS
INTRAVENOUS | Status: DC | PRN
Start: 2024-02-19 — End: 2024-02-19
  Administered 2024-02-19: 150 mg via INTRAVENOUS

## 2024-02-19 MED ORDER — MIDAZOLAM HCL 2 MG/2ML IJ SOLN
INTRAMUSCULAR | Status: DC | PRN
  Administered 2024-02-19: 1 mg via INTRAVENOUS

## 2024-02-19 MED ORDER — FENTANYL CITRATE (PF) 100 MCG/2ML IJ SOLN
INTRAMUSCULAR | Status: AC
Start: 2024-02-19 — End: 2024-02-19
  Filled 2024-02-19: qty 2

## 2024-02-19 MED ORDER — 0.9 % SODIUM CHLORIDE (POUR BTL) OPTIME
TOPICAL | Status: DC | PRN
  Administered 2024-02-19: 200 mL

## 2024-02-19 MED ORDER — CEFAZOLIN SODIUM-DEXTROSE 2-4 GM/100ML-% IV SOLN
INTRAVENOUS | Status: AC
  Filled 2024-02-19: qty 100

## 2024-02-19 MED ORDER — MIDAZOLAM HCL 2 MG/2ML IJ SOLN
INTRAMUSCULAR | Status: AC
  Filled 2024-02-19: qty 2

## 2024-02-19 MED ORDER — DEXAMETHASONE SODIUM PHOSPHATE 10 MG/ML IJ SOLN
INTRAMUSCULAR | Status: AC
  Filled 2024-02-19: qty 1

## 2024-02-19 MED ORDER — DEXAMETHASONE SODIUM PHOSPHATE 10 MG/ML IJ SOLN
INTRAMUSCULAR | Status: DC | PRN
  Administered 2024-02-19: 5 mg via INTRAVENOUS

## 2024-02-19 MED ORDER — OXYCODONE HCL 5 MG PO TABS
ORAL_TABLET | ORAL | Status: AC
  Filled 2024-02-19: qty 1

## 2024-02-19 MED ORDER — PROPOFOL 10 MG/ML IV BOLUS
INTRAVENOUS | Status: AC
  Filled 2024-02-19: qty 20

## 2024-02-19 MED ORDER — BUPIVACAINE HCL (PF) 0.5 % IJ SOLN
INTRAMUSCULAR | Status: DC | PRN
  Administered 2024-02-19: 15 mL

## 2024-02-19 MED ORDER — OXYCODONE HCL 5 MG/5ML PO SOLN: 5.0000 mg | Freq: Once | ORAL | Status: AC | PRN

## 2024-02-19 MED ORDER — LIDOCAINE HCL (PF) 2 % IJ SOLN
INTRAMUSCULAR | Status: AC
  Filled 2024-02-19: qty 5

## 2024-02-19 MED ORDER — ACETAMINOPHEN 10 MG/ML IV SOLN
INTRAVENOUS | Status: AC
  Filled 2024-02-19: qty 100

## 2024-02-19 MED ORDER — ROSUVASTATIN CALCIUM 40 MG PO TABS: 40.0000 mg | ORAL_TABLET | Freq: Every evening | ORAL | Status: DC

## 2024-02-19 MED ORDER — ACETAMINOPHEN 10 MG/ML IV SOLN: 1000.0000 mg | Freq: Once | INTRAVENOUS | Status: DC | PRN

## 2024-02-19 MED ORDER — PHENYLEPHRINE 80 MCG/ML (10ML) SYRINGE FOR IV PUSH (FOR BLOOD PRESSURE SUPPORT)
PREFILLED_SYRINGE | INTRAVENOUS | Status: AC
  Filled 2024-02-19: qty 10

## 2024-02-19 MED ORDER — ONDANSETRON HCL 4 MG/2ML IJ SOLN
INTRAMUSCULAR | Status: DC | PRN
  Administered 2024-02-19: 4 mg via INTRAVENOUS

## 2024-02-19 MED ORDER — LIDOCAINE HCL (CARDIAC) PF 100 MG/5ML IV SOSY
PREFILLED_SYRINGE | INTRAVENOUS | Status: DC | PRN
  Administered 2024-02-19: 100 mg via INTRAVENOUS

## 2024-02-19 MED ORDER — DROPERIDOL 2.5 MG/ML IJ SOLN: 0.6250 mg | Freq: Once | INTRAMUSCULAR | Status: DC | PRN

## 2024-02-19 MED ORDER — BUPIVACAINE HCL (PF) 0.5 % IJ SOLN
INTRAMUSCULAR | Status: AC
  Filled 2024-02-19: qty 30

## 2024-02-19 MED ORDER — FENTANYL CITRATE (PF) 100 MCG/2ML IJ SOLN
INTRAMUSCULAR | Status: DC | PRN
  Administered 2024-02-19 (×2): 50 ug via INTRAVENOUS

## 2024-02-19 MED ORDER — EPHEDRINE SULFATE-NACL 50-0.9 MG/10ML-% IV SOSY
PREFILLED_SYRINGE | INTRAVENOUS | Status: DC | PRN
  Administered 2024-02-19: 10 mg via INTRAVENOUS

## 2024-02-19 MED ORDER — CHLORHEXIDINE GLUCONATE 0.12 % MT SOLN
OROMUCOSAL | Status: AC
  Filled 2024-02-19: qty 15

## 2024-02-19 MED ORDER — PHENYLEPHRINE 80 MCG/ML (10ML) SYRINGE FOR IV PUSH (FOR BLOOD PRESSURE SUPPORT)
PREFILLED_SYRINGE | INTRAVENOUS | Status: DC | PRN
  Administered 2024-02-19 (×2): 80 ug via INTRAVENOUS

## 2024-02-19 MED ORDER — EPHEDRINE 5 MG/ML INJ
INTRAVENOUS | Status: AC
  Filled 2024-02-19: qty 5

## 2024-02-19 MED ORDER — EZETIMIBE 10 MG PO TABS: 10.0000 mg | ORAL_TABLET | Freq: Every evening | ORAL | Status: DC

## 2024-02-19 MED ORDER — OXYCODONE HCL 5 MG PO TABS
5.0000 mg | ORAL_TABLET | Freq: Once | ORAL | Status: AC | PRN
  Administered 2024-02-19: 5 mg via ORAL

## 2024-02-19 MED ORDER — ONDANSETRON HCL 4 MG/2ML IJ SOLN
INTRAMUSCULAR | Status: AC
  Filled 2024-02-19: qty 2

## 2024-02-19 SURGICAL SUPPLY — 37 items
BNDG COHESIVE 4X5 TAN STRL LF (GAUZE/BANDAGES/DRESSINGS) ×2 IMPLANT
BNDG ELASTIC 2INX 5YD STR LF (GAUZE/BANDAGES/DRESSINGS) ×2 IMPLANT
BNDG ESMARCH 4X12 STRL LF (GAUZE/BANDAGES/DRESSINGS) ×2 IMPLANT
BNDG STRETCH GAUZE 3IN X12FT (GAUZE/BANDAGES/DRESSINGS) ×2 IMPLANT
CHLORAPREP W/TINT 26 (MISCELLANEOUS) ×2 IMPLANT
CORD BIP STRL DISP 12FT (MISCELLANEOUS) ×2 IMPLANT
CUFF TOURN SGL QUICK 18X4 (TOURNIQUET CUFF) ×2 IMPLANT
DRAPE FLUOR MINI C-ARM 54X84 (DRAPES) ×2 IMPLANT
DRAPE SURG 17X11 SM STRL (DRAPES) ×2 IMPLANT
FORCEPS JEWEL BIP 4-3/4 STR (INSTRUMENTS) ×2 IMPLANT
GAUZE SPONGE 4X4 12PLY STRL (GAUZE/BANDAGES/DRESSINGS) ×2 IMPLANT
GAUZE XEROFORM 1X8 LF (GAUZE/BANDAGES/DRESSINGS) ×2 IMPLANT
GLOVE BIO SURGEON STRL SZ8 (GLOVE) ×4 IMPLANT
GLOVE BIOGEL PI IND STRL 8 (GLOVE) ×2 IMPLANT
GLOVE INDICATOR 8.0 STRL GRN (GLOVE) ×2 IMPLANT
GOWN STRL REUS W/ TWL LRG LVL3 (GOWN DISPOSABLE) ×2 IMPLANT
GOWN STRL REUS W/ TWL XL LVL3 (GOWN DISPOSABLE) ×2 IMPLANT
KIT ESCP INSRT D SLOT CANN KN (MISCELLANEOUS) ×2 IMPLANT
KIT TURNOVER KIT A (KITS) ×2 IMPLANT
MANIFOLD NEPTUNE II (INSTRUMENTS) ×2 IMPLANT
NDL HYPO 25X1 1.5 SAFETY (NEEDLE) ×2 IMPLANT
NEEDLE HYPO 25X1 1.5 SAFETY (NEEDLE) ×2 IMPLANT
NS IRRIG 500ML POUR BTL (IV SOLUTION) ×2 IMPLANT
PACK EXTREMITY ARMC (MISCELLANEOUS) ×2 IMPLANT
PENCIL SMOKE EVACUATOR (MISCELLANEOUS) ×2 IMPLANT
SPLINT WRIST LG LT TX990309 (SOFTGOODS) IMPLANT
SPLINT WRIST LG RT TX900304 (SOFTGOODS) IMPLANT
SPLINT WRIST M LT TX990308 (SOFTGOODS) ×2 IMPLANT
SPLINT WRIST M RT TX990303 (SOFTGOODS) IMPLANT
SPLINT WRIST XL LT TX990310 (SOFTGOODS) IMPLANT
SPLINT WRIST XL RT TX990305 (SOFTGOODS) IMPLANT
STOCKINETTE IMPERV 14X48 (MISCELLANEOUS) ×2 IMPLANT
STOCKINETTE IMPERVIOUS 9X36 MD (GAUZE/BANDAGES/DRESSINGS) ×2 IMPLANT
SUT PROLENE 4 0 PS 2 18 (SUTURE) ×2 IMPLANT
SYR 10ML LL (SYRINGE) ×2 IMPLANT
TRAP FLUID SMOKE EVACUATOR (MISCELLANEOUS) ×2 IMPLANT
WATER STERILE IRR 500ML POUR (IV SOLUTION) ×2 IMPLANT

## 2024-02-19 NOTE — Op Note (Signed)
 02/19/2024  12:00 PM  Patient:   Todd Weaver  Pre-Op Diagnosis:   1. Right carpal tunnel syndrome.  2. Right long trigger finger.  Post-Op Diagnosis:   Same.  Procedure:   1. Endoscopic right carpal tunnel release.  2. Release of right long trigger finger.  Surgeon:   DOROTHA Reyes Maltos, MD  Anesthesia:   General LMA  Findings:   As above.  Complications:   None  EBL:   0 cc  Fluids:   600 cc crystalloid  TT:   24 minutes at 250 mmHg  Drains:   None  Closure:   4-0 Prolene interrupted sutures  Brief Clinical Note:   The patient is a 73 year old male with a history of progressively worsening pain and paresthesias to his right hand. His symptoms have progressed despite medications, activity modification, etc. His history and examination are consistent with carpal tunnel syndrome. The patient presents at this time for an endoscopic right carpal tunnel release.   The patient also complains of painful catching of his right long finger. The symptoms have persisted despite medications, activity modification, etc. His history and examination are consistent with a right long trigger finger. The patient presents at this time for a release of his right long trigger finger.  Procedure:   The patient was brought into the operating room and lain in the supine position. After adequate general laryngeal mask anesthesia was obtained, the right hand and upper extremity were prepped with ChloraPrep solution before being draped sterilely. Preoperative antibiotics were administered. A timeout was performed to verify the appropriate surgical site before the limb was exsanguinated with an Esmarch and the tourniquet inflated to 250 mmHg.   The carpal tunnel was approached first. An approximately 1.5-2 cm incision was made over the volar wrist flexion crease, centered over the palmaris longus tendon. The incision was carried down through the subcutaneous tissues with care taken to identify and protect  any neurovascular structures. The distal forearm fascia was penetrated just proximal to the transverse carpal ligament. The soft tissues were released off the superficial and deep surfaces of the distal forearm fascia and this was released proximally for 3-4 cm under direct visualization.  Attention was directed distally. The Therapist, nutritional was passed beneath the transverse carpal ligament along the ulnar aspect of the carpal tunnel and used to release any adhesions as well as to remove any adherent synovial tissue before first the smaller then the larger of the two dilators were passed beneath the transverse carpal ligament along the ulnar margin of the carpal tunnel. The slotted cannula was introduced and the endoscope was placed into the slotted cannula and the undersurface of the transverse carpal ligament visualized. The distal margin of the transverse carpal ligament was marked by placing a 25-gauge needle percutaneously at Kaplan's cardinal point so that it entered the distal portion of the slotted cannula. Under endoscopic visualization, the transverse carpal ligament was released from proximal to distal using the end-cutting blade. A second pass was performed to ensure complete release of the ligament. The adequacy of release was verified both endoscopically and by palpation using the freer elevator.  Next, the right long trigger finger was addressed. An approximately 1.5-2.0 cm incision was made over the volar aspect of the long finger at the level of the metacarpal head centered over the flexor sheath. The incision was carried down through the subcutaneous tissues with care taken to identify and protect the digital neurovascular structures. The flexor sheath was entered just proximal  to the A1 pulley. The sheath was released proximally for several centimeters under direct visualization. Distally, a clamp was placed beneath the A1 pulley and used to release any adhesions. The clamp was repositioned so  that one jaw was superficial to and the other jaw deep to the A1 pulley. The A1 pulley was incised on either side of the clamp to remove a 2 mm strip of tissue. Metzenbaum scissors were used to ensure complete release of the A1 pulley more distally. The underlying tendons were carefully inspected. It was noted that there was moderate fraying of the more radial band of the flexor digitorum superficialis. The remaining portions of the FDS and FDP were in satisfactory condition.  Each wound was irrigated thoroughly with sterile saline solution before being closed using 4-0 Prolene interrupted sutures. A total of 15 cc of 0.5% plain Sensorcaine  was injected in and around the incision before sterile bulky dressings were applied to the wounds. The patient was placed into a volar wrist splint before being awakened, extubated, and returned to the recovery room in satisfactory condition after tolerating the procedure well.

## 2024-02-19 NOTE — Anesthesia Preprocedure Evaluation (Signed)
 Anesthesia Evaluation  Patient identified by MRN, date of birth, ID band Patient awake    Reviewed: Allergy & Precautions, H&P , NPO status , Patient's Chart, lab work & pertinent test results, reviewed documented beta blocker date and time   Airway Mallampati: II  TM Distance: >3 FB Neck ROM: full    Dental  (+) Teeth Intact   Pulmonary neg pulmonary ROS, former smoker   Pulmonary exam normal        Cardiovascular Exercise Tolerance: Good hypertension, On Medications (-) anginawith exertion + CAD, + Past MI, + Cardiac Stents, + Peripheral Vascular Disease and +CHF  (-) Orthopnea, (-) PND and (-) DOE Atrial Fibrillation  Rate:Normal     Neuro/Psych  PSYCHIATRIC DISORDERS       Neuromuscular disease CVA    GI/Hepatic Neg liver ROS,GERD  Medicated,,  Endo/Other  negative endocrine ROS    Renal/GU negative Renal ROS  negative genitourinary   Musculoskeletal   Abdominal   Peds  Hematology negative hematology ROS (+)   Anesthesia Other Findings   Reproductive/Obstetrics negative OB ROS                              Anesthesia Physical Anesthesia Plan  ASA: 3  Anesthesia Plan: General LMA   Post-op Pain Management:    Induction:   PONV Risk Score and Plan: 3  Airway Management Planned:   Additional Equipment:   Intra-op Plan:   Post-operative Plan:   Informed Consent: I have reviewed the patients History and Physical, chart, labs and discussed the procedure including the risks, benefits and alternatives for the proposed anesthesia with the patient or authorized representative who has indicated his/her understanding and acceptance.       Plan Discussed with: CRNA  Anesthesia Plan Comments:         Anesthesia Quick Evaluation

## 2024-02-19 NOTE — Anesthesia Postprocedure Evaluation (Signed)
 Anesthesia Post Note  Patient: Todd Weaver  Procedure(s) Performed: RELEASE, CARPAL TUNNEL, ENDOSCOPIC (Right: Wrist) RELEASE, A1 PULLEY, FOR TRIGGER FINGER (Right: Middle Finger)  Patient location during evaluation: PACU Anesthesia Type: General Level of consciousness: awake and alert Pain management: pain level controlled Vital Signs Assessment: post-procedure vital signs reviewed and stable Respiratory status: spontaneous breathing, nonlabored ventilation, respiratory function stable and patient connected to nasal cannula oxygen Cardiovascular status: blood pressure returned to baseline and stable Postop Assessment: no apparent nausea or vomiting Anesthetic complications: no   No notable events documented.   Last Vitals:  Vitals:   02/19/24 1230 02/19/24 1247  BP: 120/80 129/84  Pulse: 62 64  Resp: 16 16  Temp: (!) 35.7 C (!) 36.2 C  SpO2: 100% 98%    Last Pain:  Vitals:   02/19/24 1247  TempSrc: Temporal  PainSc: 1                  Lynwood KANDICE Clause

## 2024-02-19 NOTE — Transfer of Care (Signed)
 Immediate Anesthesia Transfer of Care Note  Patient: Todd Weaver  Procedure(s) Performed: RELEASE, CARPAL TUNNEL, ENDOSCOPIC (Right: Wrist) RELEASE, A1 PULLEY, FOR TRIGGER FINGER (Right: Middle Finger)  Patient Location: PACU  Anesthesia Type:General  Level of Consciousness: responds to stimulation  Airway & Oxygen Therapy: Patient Spontanous Breathing and Patient connected to face mask oxygen  Post-op Assessment: Report given to RN and Post -op Vital signs reviewed and stable  Post vital signs: stable  Last Vitals:  Vitals Value Taken Time  BP 135/83 02/19/24 12:00  Temp    Pulse 60 02/19/24 12:01  Resp 25 02/19/24 12:01  SpO2 100 % 02/19/24 12:01  Vitals shown include unfiled device data.  Last Pain:  Vitals:   02/19/24 0912  TempSrc: Temporal  PainSc: 7          Complications: No notable events documented.

## 2024-02-19 NOTE — Discharge Instructions (Addendum)
 Orthopedic discharge instructions: Keep dressing dry and intact. Keep hand elevated above heart level. May shower after dressing removed on postop day 4 (Sunday). Cover sutures with Band-Aids after drying off, then reapply Velcro splint. Apply ice to affected area frequently. Take ibuprofen 600 mg TID with meals for 7-10 days, then as necessary. Take ES Tylenol  or pain medication as prescribed when needed.  May resume daily Plavix  and baby aspirin  tomorrow morning. Return for follow-up in 10-14 days or as scheduled.

## 2024-02-20 ENCOUNTER — Encounter: Payer: Self-pay | Admitting: Surgery

## 2024-02-26 ENCOUNTER — Other Ambulatory Visit: Payer: Self-pay | Admitting: Internal Medicine

## 2024-02-26 DIAGNOSIS — E782 Mixed hyperlipidemia: Secondary | ICD-10-CM

## 2024-02-27 NOTE — Telephone Encounter (Signed)
 Requested medications are due for refill today.  unsure  Requested medications are on the active medications list.  yes  Last refill. 02/19/2024  Future visit scheduled.   yes  Notes to clinic.  Rx signed by Dr. Edie. Rx was No print.    Requested Prescriptions  Pending Prescriptions Disp Refills   ezetimibe  (ZETIA ) 10 MG tablet [Pharmacy Med Name: EZETIMIBE  10 MG Oral Tablet] 90 tablet 3    Sig: TAKE 1 TABLET EVERY DAY     Cardiovascular:  Antilipid - Sterol Transport Inhibitors Failed - 02/27/2024  4:21 PM      Failed - Lipid Panel in normal range within the last 12 months    Cholesterol, Total  Date Value Ref Range Status  04/24/2023 168 100 - 199 mg/dL Final   LDL Chol Calc (NIH)  Date Value Ref Range Status  04/24/2023 91 0 - 99 mg/dL Final   LDL Direct  Date Value Ref Range Status  09/24/2017 116 (H) 0 - 99 mg/dL Final   HDL  Date Value Ref Range Status  04/24/2023 62 >39 mg/dL Final   Triglycerides  Date Value Ref Range Status  04/24/2023 81 0 - 149 mg/dL Final         Passed - AST in normal range and within 360 days    AST  Date Value Ref Range Status  04/24/2023 37 0 - 40 IU/L Final         Passed - ALT in normal range and within 360 days    ALT  Date Value Ref Range Status  04/24/2023 41 0 - 44 IU/L Final         Passed - Patient is not pregnant      Passed - Valid encounter within last 12 months    Recent Outpatient Visits           2 months ago Essential hypertension   [place] [facility] at [facility], Leita DEL, MD   5 months ago Hearing loss of right ear due to cerumen impaction   [facility] at [facility], Leita DEL, MD       Future Appointments             In 2 months Justus, Leita DEL, MD [facility] at [facility], [facility]   In 9 months Francisca, Redell BROCKS, MD [facility] [place]

## 2024-03-26 ENCOUNTER — Encounter: Payer: Self-pay | Admitting: Urology

## 2024-04-23 ENCOUNTER — Other Ambulatory Visit: Payer: Self-pay | Admitting: Internal Medicine

## 2024-04-23 DIAGNOSIS — E782 Mixed hyperlipidemia: Secondary | ICD-10-CM

## 2024-04-24 NOTE — Telephone Encounter (Signed)
 Requested medications are due for refill today.  unsure  Requested medications are on the active medications list.  yes  Last refill. 02/19/2024?  Future visit scheduled.   yes  Notes to clinic.  Dr. Edie signed refill. But unclear if statin was ordered. Please review for refill.    Requested Prescriptions  Pending Prescriptions Disp Refills   rosuvastatin  (CRESTOR ) 40 MG tablet [Pharmacy Med Name: ROSUVASTATIN  CALCIUM  40 MG Oral Tablet] 90 tablet 3    Sig: TAKE 1 TABLET EVERY DAY     Cardiovascular:  Antilipid - Statins 2 Failed - 04/24/2024 11:31 AM      Failed - Lipid Panel in normal range within the last 12 months    Cholesterol, Total  Date Value Ref Range Status  04/24/2023 168 100 - 199 mg/dL Final   LDL Chol Calc (NIH)  Date Value Ref Range Status  04/24/2023 91 0 - 99 mg/dL Final   LDL Direct  Date Value Ref Range Status  09/24/2017 116 (H) 0 - 99 mg/dL Final   HDL  Date Value Ref Range Status  04/24/2023 62 >39 mg/dL Final   Triglycerides  Date Value Ref Range Status  04/24/2023 81 0 - 149 mg/dL Final         Passed - Cr in normal range and within 360 days    Creatinine, Ser  Date Value Ref Range Status  02/13/2024 0.96 0.61 - 1.24 mg/dL Final         Passed - Patient is not pregnant      Passed - Valid encounter within last 12 months    Recent Outpatient Visits           4 months ago Essential hypertension   [place] [facility] at [facility], Leita DEL, MD   6 months ago Hearing loss of right ear due to cerumen impaction   [facility] at [facility], Leita DEL, MD       Future Appointments             In 3 days Justus, Leita DEL, MD [facility] Medicine at [facility], [address]   In 7 months Francisca, Redell BROCKS, MD [facility] [place]

## 2024-04-24 NOTE — Telephone Encounter (Signed)
 Please review.  KP

## 2024-04-27 ENCOUNTER — Encounter: Payer: Self-pay | Admitting: Internal Medicine

## 2024-04-27 ENCOUNTER — Ambulatory Visit (INDEPENDENT_AMBULATORY_CARE_PROVIDER_SITE_OTHER): Payer: Self-pay | Admitting: Internal Medicine

## 2024-04-27 VITALS — BP 128/78 | HR 63 | Ht 67.0 in | Wt 175.0 lb

## 2024-04-27 DIAGNOSIS — E782 Mixed hyperlipidemia: Secondary | ICD-10-CM | POA: Diagnosis not present

## 2024-04-27 DIAGNOSIS — K219 Gastro-esophageal reflux disease without esophagitis: Secondary | ICD-10-CM | POA: Diagnosis not present

## 2024-04-27 DIAGNOSIS — R7303 Prediabetes: Secondary | ICD-10-CM

## 2024-04-27 DIAGNOSIS — Z0000 Encounter for general adult medical examination without abnormal findings: Secondary | ICD-10-CM

## 2024-04-27 DIAGNOSIS — R351 Nocturia: Secondary | ICD-10-CM | POA: Diagnosis not present

## 2024-04-27 DIAGNOSIS — N401 Enlarged prostate with lower urinary tract symptoms: Secondary | ICD-10-CM

## 2024-04-27 DIAGNOSIS — I25118 Atherosclerotic heart disease of native coronary artery with other forms of angina pectoris: Secondary | ICD-10-CM | POA: Diagnosis not present

## 2024-04-27 DIAGNOSIS — Z23 Encounter for immunization: Secondary | ICD-10-CM | POA: Diagnosis not present

## 2024-04-27 DIAGNOSIS — I10 Essential (primary) hypertension: Secondary | ICD-10-CM | POA: Diagnosis not present

## 2024-04-27 MED ORDER — EZETIMIBE 10 MG PO TABS: 10.0000 mg | ORAL_TABLET | Freq: Every evening | ORAL | 3 refills | Status: AC

## 2024-04-27 MED ORDER — OMEPRAZOLE 40 MG PO CPDR: 40.0000 mg | DELAYED_RELEASE_CAPSULE | Freq: Every evening | ORAL | 3 refills | Status: AC

## 2024-04-27 NOTE — Assessment & Plan Note (Signed)
 Well controlled blood pressure today. Current regimen is Coreg  and Entresto . No medication side effects noted.

## 2024-04-27 NOTE — Assessment & Plan Note (Signed)
 LDL is  Lab Results  Component Value Date   LDLCALC 91 04/24/2023    Current medication regimen is Crestor  and Zetia . Goal LDL is < 55.

## 2024-04-27 NOTE — Progress Notes (Signed)
 Date:  04/27/2024   Name:  Todd Weaver   DOB:  July 09, 1951   MRN:  [id number]   Chief Complaint: Annual Exam Todd Weaver is a 73 y.o. male who presents today for his Complete Annual Exam. He feels fairly well. He reports exercising. He reports he is sleeping poorly due to aches and pains.  Health Maintenance  Topic Date Due   COVID-19 Vaccine (3 - 2025-26 season) 03/23/2024   Zoster (Shingles) Vaccine (1 of 2) 07/28/2024*   Medicare Annual Wellness Visit  01/14/2025   DTaP/Tdap/Td vaccine (2 - Td or Tdap) 06/20/2027   Colon Cancer Screening  02/11/2029   Pneumococcal Vaccine for age over 86  Completed   Flu Shot  Completed   Hepatitis C Screening  Completed   Meningitis B Vaccine  Aged Out  *Topic was postponed. The date shown is not the original due date.   Lab Results  Component Value Date   PSA1 1.8 04/24/2023   PSA1 2.0 04/17/2022   PSA1 1.8 04/13/2021    Hypertension This is a chronic problem. The problem is controlled. Pertinent negatives include no chest pain, headaches, palpitations or shortness of breath. Past treatments include beta blockers and angiotensin blockers (Entesto). Hypertensive end-organ damage includes CAD/MI. There is no history of kidney disease or CVA.  Hyperlipidemia This is a chronic problem. The problem is controlled. Pertinent negatives include no chest pain or shortness of breath. Current antihyperlipidemic treatment includes statins and ezetimibe . The current treatment provides significant improvement of lipids.    Review of Systems  Constitutional:  Negative for fatigue and unexpected weight change.  HENT:  Negative for nosebleeds.   Eyes:  Negative for visual disturbance.  Respiratory:  Negative for cough, chest tightness, shortness of breath and wheezing.   Cardiovascular:  Negative for chest pain, palpitations and leg swelling.  Gastrointestinal:  Negative for abdominal pain, constipation and diarrhea.  Neurological:  Negative for  dizziness, weakness, light-headedness and headaches.     Lab Results  Component Value Date   NA 139 02/13/2024   K 3.9 02/13/2024   CO2 27 02/13/2024   GLUCOSE 96 02/13/2024   BUN 13 02/13/2024   CREATININE 0.96 02/13/2024   CALCIUM  9.1 02/13/2024   EGFR 64 04/24/2023   GFRNONAA >60 02/13/2024   Lab Results  Component Value Date   CHOL 168 04/24/2023   HDL 62 04/24/2023   LDLCALC 91 04/24/2023   LDLDIRECT 116 (H) 09/24/2017   TRIG 81 04/24/2023   CHOLHDL 2.7 04/24/2023   Lab Results  Component Value Date   TSH 2.390 11/07/2021   Lab Results  Component Value Date   HGBA1C 6.1 (H) 04/24/2023   Lab Results  Component Value Date   WBC 6.0 02/13/2024   HGB 13.4 02/13/2024   HCT 39.9 02/13/2024   MCV 81.1 02/13/2024   PLT 237 02/13/2024   Lab Results  Component Value Date   ALT 41 04/24/2023   AST 37 04/24/2023   ALKPHOS 50 04/24/2023   BILITOT 1.3 (H) 04/24/2023   No results found for: MARIEN BOLLS, VD25OH   Patient Active Problem List   Diagnosis Date Noted   Hematuria 12/02/2023   Carpal tunnel syndrome, bilateral 05/22/2023   Prediabetes 04/24/2023   Subareolar gynecomastia in male 02/01/2023   Cervical spinal stenosis 08/22/2022   Primary insomnia 06/18/2022   Contrast media allergy 12/12/2021   GERD (gastroesophageal reflux disease) 12/12/2021   Ischemic cardiomyopathy 12/12/2021   Urinary hesitancy 12/26/2020  Carpal tunnel syndrome on both sides 12/26/2020   Chronic systolic congestive heart failure (HCC) 09/29/2020   Thrombophilia 09/29/2020   PAF (paroxysmal atrial fibrillation) (HCC) 04/11/2020   PAD (peripheral artery disease) 03/31/2019   Rectal polyp    Impingement syndrome of left shoulder region 10/14/2018   Coronary artery disease of native artery of native heart with stable angina pectoris 10/03/2018   Essential hypertension 06/19/2017   Mixed hyperlipidemia 06/19/2017   Overweight (BMI 25.0-29.9) 06/19/2017   BPH  (benign prostatic hyperplasia) 06/19/2017   ED (erectile dysfunction) 06/19/2017   Lumbar spondylosis with myelopathy 11/08/2016   Hx of transient ischemic attack (TIA) 2011    Allergies  Allergen Reactions   Codeine Hives and Other (See Comments)   Shellfish Allergy Hives and Swelling   Contrast Media [Iodinated Contrast Media] Itching and Other (See Comments)    Whelts on tongue   Fish Allergy Itching    Past Surgical History:  Procedure Laterality Date   ANAL FISTULECTOMY N/A 12/21/2014   Procedure: FISTULECTOMY ANAL;  Surgeon: Louanne KANDICE Muse, MD;  Location: [facility] ORS;  Service: General;  Laterality: N/A;   ANTERIOR CERVICAL DECOMP/DISCECTOMY FUSION N/A 08/22/2022   Procedure: C4-6 ANTERIOR CERVICAL DISCECTOMY AND FUSION (NUVASIVE ACP, GLOBUS HEDRON);  Surgeon: Clois Fret, MD;  Location: [facility] ORS;  Service: Neurosurgery;  Laterality: N/A;   APPENDECTOMY  1975   CARDIAC CATHETERIZATION     CARPAL TUNNEL RELEASE Left 07/04/2023   Procedure: CARPAL TUNNEL RELEASE ENDOSCOPIC;  Surgeon: Edie Norleen PARAS, MD;  Location: [facility] ORS;  Service: Orthopedics;  Laterality: Left;   CARPAL TUNNEL RELEASE Right 02/19/2024   Procedure: RELEASE, CARPAL TUNNEL, ENDOSCOPIC;  Surgeon: Edie Norleen PARAS, MD;  Location: [facility] ORS;  Service: Orthopedics;  Laterality: Right;   COLONOSCOPY WITH PROPOFOL  N/A 02/12/2019   Procedure: COLONOSCOPY WITH PROPOFOL ;  Surgeon: Jinny Carmine, MD;  Location: [facility] ENDOSCOPY;  Service: Endoscopy;  Laterality: N/A;   CORONARY STENT INTERVENTION N/A 09/09/2017   Procedure: CORONARY STENT INTERVENTION;  Surgeon: Darron Deatrice LABOR, MD;  Location: [facility] INVASIVE CV LAB;  Service: Cardiovascular;  Laterality: N/A;   CORONARY ULTRASOUND/IVUS N/A 09/09/2017   Procedure: Intravascular Ultrasound/IVUS;  Surgeon: Darron Deatrice LABOR, MD;  Location: [facility] INVASIVE CV LAB;  Service: Cardiovascular;  Laterality: N/A;   CORONARY/GRAFT ACUTE MI REVASCULARIZATION N/A 07/18/2017   Procedure:  Coronary/Graft Acute MI Revascularization;  Surgeon: Darron Deatrice LABOR, MD;  Location: [facility] INVASIVE CV LAB;  Service: Cardiovascular;  Laterality: N/A;   HEMORRHOID SURGERY N/A 12/21/2014   Procedure: HEMORRHOIDECTOMY;  Surgeon: Louanne KANDICE Muse, MD;  Location: [facility] ORS;  Service: General;  Laterality: N/A;   KNEE SURGERY Right age 60   LEFT HEART CATH AND CORONARY ANGIOGRAPHY N/A 07/18/2017   Procedure: LEFT HEART CATH AND CORONARY ANGIOGRAPHY;  Surgeon: Darron Deatrice LABOR, MD;  Location: [facility] INVASIVE CV LAB;  Service: Cardiovascular;  Laterality: N/A;   LEFT HEART CATH AND CORONARY ANGIOGRAPHY Left 08/19/2017   Procedure: LEFT HEART CATH AND CORONARY ANGIOGRAPHY;  Surgeon: Darron Deatrice LABOR, MD;  Location: [facility] INVASIVE CV LAB;  Service: Cardiovascular;  Laterality: Left;   LEFT HEART CATH AND CORONARY ANGIOGRAPHY N/A 11/07/2017   Procedure: LEFT HEART CATH AND CORONARY ANGIOGRAPHY;  Surgeon: Darron Deatrice LABOR, MD;  Location: [facility];  Service: Cardiovascular;  Laterality: N/A;   LEFT HEART CATH AND CORONARY ANGIOGRAPHY N/A 10/17/2021   Procedure: LEFT HEART CATH AND CORONARY ANGIOGRAPHY;  Surgeon: Darron Deatrice LABOR, MD;  Location: [facility] INVASIVE CV LAB;  Service: Cardiovascular;  Laterality: N/A;   ROTATOR CUFF REPAIR Right 2013   TRIGGER FINGER RELEASE Right 02/19/2024   Procedure: RELEASE, A1 PULLEY, FOR TRIGGER FINGER;  Surgeon: Edie Norleen PARAS, MD;  Location: [facility] ORS;  Service: Orthopedics;  Laterality: Right;    Social History   Tobacco Use   Smoking status: Former    Current packs/day: 0.00    Average packs/day: 1 pack/day for 38.0 years (38.0 ttl pk-yrs)    Types: Cigarettes    Start date: 31    Quit date: 07/24/2007    Years since quitting: 16.7    Passive exposure: Past   Smokeless tobacco: Former    Types: Chew    Quit date: 2005   Tobacco comments:    smoking cessation materials not required  Vaping Use   Vaping status: Never Used  Substance Use Topics    Alcohol use: Yes    Alcohol/week: 7.0 - 14.0 standard drinks of alcohol    Types: 7 - 14 Cans of beer per week    Comment: 1-2 beers daily   Drug use: No     Medication list has been reviewed and updated.  Current Meds  Medication Sig   acetaminophen  (TYLENOL ) 500 MG tablet Take 500-1,000 mg by mouth every 6 (six) hours as needed (pain.).   aspirin  EC 81 MG tablet Take 81 mg by mouth in the morning.   carvedilol  (COREG ) 3.125 MG tablet TAKE 1 TABLET TWICE DAILY WITH MEALS   nitroGLYCERIN  (NITROSTAT ) 0.4 MG SL tablet Place 1 tablet (0.4 mg total) under the tongue every 5 (five) minutes as needed for chest pain.   oxybutynin  (DITROPAN -XL) 10 MG 24 hr tablet TAKE 1 TABLET AT BEDTIME   rosuvastatin  (CRESTOR ) 40 MG tablet TAKE 1 TABLET EVERY DAY   sacubitril -valsartan  (ENTRESTO ) 49-51 MG TAKE 1 TABLET TWICE DAILY   tamsulosin  (FLOMAX ) 0.4 MG CAPS capsule TAKE 1 CAPSULE EVERY DAY   [DISCONTINUED] clopidogrel  (PLAVIX ) 75 MG tablet Take 75 mg by mouth in the morning.   [DISCONTINUED] ezetimibe  (ZETIA ) 10 MG tablet Take 1 tablet (10 mg total) by mouth every evening.   [DISCONTINUED] HYDROcodone -acetaminophen  (NORCO/VICODIN) 5-325 MG tablet Take 1-2 tablets by mouth every 6 (six) hours as needed for moderate pain (pain score 4-6) or severe pain (pain score 7-10).   [DISCONTINUED] omeprazole  (PRILOSEC) 40 MG capsule Take 40 mg by mouth every evening.       04/27/2024    7:57 AM 12/02/2023    3:01 PM 09/30/2023    8:14 AM 04/24/2023    9:43 AM  GAD 7 : Generalized Anxiety Score  Nervous, Anxious, on Edge 0 1 1 0  Control/stop worrying 0 0 0 0  Worry too much - different things 0 0 0 0  Trouble relaxing 0 1 2 1   Restless 0 1 2 1   Easily annoyed or irritable 0 1 1 0  Afraid - awful might happen 0 0 0 0  Total GAD 7 Score 0 4 6 2   Anxiety Difficulty Not difficult at all Not difficult at all  Not difficult at all       04/27/2024    7:57 AM 01/15/2024    1:24 PM 12/02/2023    3:01 PM   Depression screen PHQ 2/9  Decreased Interest 0 0 1  Down, Depressed, Hopeless 0 0 0  PHQ - 2 Score 0 0 1  Altered sleeping 3 3 2   Tired, decreased energy 3 2 2   Change in appetite 0 0 1  Feeling bad or failure about yourself  0 0 0  Trouble concentrating 0 0 0  Moving slowly or fidgety/restless 0 0 0  Suicidal thoughts 0 0 0  PHQ-9 Score 6 5 6   Difficult doing work/chores Not difficult at all Not difficult at all Not difficult at all    BP Readings from Last 3 Encounters:  04/27/24 128/78  02/19/24 129/84  01/03/24 130/70    Physical Exam Vitals and nursing note reviewed.  Constitutional:      Appearance: Normal appearance. He is well-developed.  HENT:     Head: Normocephalic.     Right Ear: Tympanic membrane, ear canal and external ear normal.     Left Ear: Tympanic membrane, ear canal and external ear normal.     Nose: Nose normal.  Eyes:     Conjunctiva/sclera: Conjunctivae normal.     Pupils: Pupils are equal, round, and reactive to light.  Neck:     Thyroid : No thyromegaly.     Vascular: No carotid bruit.  Cardiovascular:     Rate and Rhythm: Normal rate and regular rhythm.     Heart sounds: Normal heart sounds.  Pulmonary:     Effort: Pulmonary effort is normal.     Breath sounds: Normal breath sounds. No wheezing.  Chest:  Breasts:    Right: No mass.     Left: No mass.  Abdominal:     General: Bowel sounds are normal.     Palpations: Abdomen is soft.     Tenderness: There is no abdominal tenderness.  Musculoskeletal:        General: Normal range of motion.     Cervical back: Normal range of motion and neck supple.  Lymphadenopathy:     Cervical: No cervical adenopathy.  Skin:    General: Skin is warm and dry.  Neurological:     Mental Status: He is alert and oriented to person, place, and time.     Deep Tendon Reflexes: Reflexes are normal and symmetric.  Psychiatric:        Attention and Perception: Attention normal.        Mood and Affect:  Mood normal.        Thought Content: Thought content normal.     Wt Readings from Last 3 Encounters:  04/27/24 175 lb (79.4 kg)  01/15/24 185 lb (83.9 kg)  01/03/24 181 lb (82.1 kg)    BP 128/78   Pulse 63   Ht 5' 7 (1.702 m)   Wt 175 lb (79.4 kg)   SpO2 97%   BMI 27.41 kg/m   Assessment and Plan:  Problem List Items Addressed This Visit       Unprioritized   BPH (benign prostatic hyperplasia)   Relevant Orders   PSA   Coronary artery disease of native artery of native heart with stable angina pectoris (Chronic)   Doing well from cardiac standpoint. He has not used NTG in many months. He is now off spironolactone .  Last ECHO improved per cardiology.      Relevant Medications   ezetimibe  (ZETIA ) 10 MG tablet   Essential hypertension (Chronic)   Well controlled blood pressure today. Current regimen is Coreg  and Entresto . No medication side effects noted.        Relevant Medications   ezetimibe  (ZETIA ) 10 MG tablet   Other Relevant Orders   Comprehensive metabolic panel with GFR   TSH   GERD (gastroesophageal reflux disease)   Relevant Medications   omeprazole  (PRILOSEC) 40  MG capsule   Mixed hyperlipidemia (Chronic)   LDL is  Lab Results  Component Value Date   LDLCALC 91 04/24/2023    Current medication regimen is Crestor  and Zetia . Goal LDL is < 55.       Relevant Medications   ezetimibe  (ZETIA ) 10 MG tablet   Other Relevant Orders   Comprehensive metabolic panel with GFR   Lipid panel   Prediabetes   Managed with diet only at this time. Lab Results  Component Value Date   HGBA1C 6.1 (H) 04/24/2023         Relevant Orders   Hemoglobin A1c   Other Visit Diagnoses       Annual physical exam    -  Primary   up to date on screenings and immunizations He can take Tylenol  2 tabs at bedtime prn general aches and pains     Encounter for immunization       Relevant Orders   Flu vaccine HIGH DOSE PF(Fluzone Trivalent) (Completed)        Return in about 1 year (around 04/27/2025) for [facility] CPX Dr. Sol.    Leita HILARIO Adie, MD [facility] [place]

## 2024-04-27 NOTE — Assessment & Plan Note (Signed)
 Managed with diet only at this time. Lab Results  Component Value Date   HGBA1C 6.1 (H) 04/24/2023

## 2024-04-27 NOTE — Assessment & Plan Note (Signed)
 Doing well from cardiac standpoint. He has not used NTG in many months. He is now off spironolactone .  Last ECHO improved per cardiology.

## 2024-04-28 ENCOUNTER — Ambulatory Visit: Payer: Self-pay | Admitting: Internal Medicine

## 2024-04-28 LAB — COMPREHENSIVE METABOLIC PANEL WITH GFR
ALT: 28 IU/L (ref 0–44)
AST: 28 IU/L (ref 0–40)
Albumin: 4.5 g/dL (ref 3.8–4.8)
Alkaline Phosphatase: 61 IU/L (ref 47–123)
BUN/Creatinine Ratio: 12 (ref 10–24)
BUN: 15 mg/dL (ref 8–27)
Bilirubin Total: 0.9 mg/dL (ref 0.0–1.2)
CO2: 21 mmol/L (ref 20–29)
Calcium: 9.6 mg/dL (ref 8.6–10.2)
Chloride: 101 mmol/L (ref 96–106)
Creatinine, Ser: 1.22 mg/dL (ref 0.76–1.27)
Globulin, Total: 2.6 g/dL (ref 1.5–4.5)
Glucose: 108 mg/dL — ABNORMAL HIGH (ref 70–99)
Potassium: 4.6 mmol/L (ref 3.5–5.2)
Sodium: 136 mmol/L (ref 134–144)
Total Protein: 7.1 g/dL (ref 6.0–8.5)
eGFR: 63 mL/min/1.73 (ref >59)

## 2024-04-28 LAB — HEMOGLOBIN A1C
Est. average glucose Bld gHb Est-mCnc: 128 mg/dL
Hgb A1c MFr Bld: 6.1 % — ABNORMAL HIGH (ref 4.8–5.6)

## 2024-04-28 LAB — LIPID PANEL
Chol/HDL Ratio: 2.7 ratio (ref 0.0–5.0)
Cholesterol, Total: 159 mg/dL (ref 100–199)
HDL: 60 mg/dL (ref >39)
LDL Chol Calc (NIH): 89 mg/dL (ref 0–99)
Triglycerides: 46 mg/dL (ref 0–149)
VLDL Cholesterol Cal: 10 mg/dL (ref 5–40)

## 2024-04-28 LAB — PSA: Prostate Specific Ag, Serum: 2.1 ng/mL (ref 0.0–4.0)

## 2024-04-28 LAB — TSH: TSH: 1.53 u[IU]/mL (ref 0.450–4.500)

## 2024-06-25 ENCOUNTER — Ambulatory Visit: Payer: Self-pay

## 2024-06-25 NOTE — Telephone Encounter (Signed)
 FYI Only or Action Required?: FYI only for provider: appointment scheduled on tomorrow.  Patient was last seen in primary care on 04/27/2024 by Justus Leita DEL, MD.  Called Nurse Triage reporting Back Pain.  Symptoms began a week ago.  Interventions attempted: OTC medications: ASA.  Symptoms are: unchanged.  Triage Disposition: See PCP When Office is Open (Within 3 Days)  Patient/caregiver understands and will follow disposition?: Yes, will follow disposition  Copied from CRM [id number]. Topic: Clinical - Red Word Triage >> Jun 25, 2024 10:58 AM Terri G wrote: Red Word that prompted transfer to Nurse Triage: extreme pain in back for a week now. Reason for Disposition  [1] MODERATE back pain (e.g., interferes with normal activities) AND [2] present > 3 days  Answer Assessment - Initial Assessment Questions 1. ONSET: When did the pain begin? (e.g., minutes, hours, days)     About a week ago 2. LOCATION: Where does it hurt? (upper, mid or lower back)     lower 3. SEVERITY: How bad is the pain?  (e.g., Scale 1-10; mild, moderate, or severe)     8 4. PATTERN: Is the pain constant? (e.g., yes, no; constant, intermittent)      Worse with sitting still 5. RADIATION: Does the pain shoot into your legs or somewhere else?     BLE 6. CAUSE:  What do you think is causing the back pain?      unsure 7. BACK OVERUSE:  Any recent lifting of heavy objects, strenuous work or exercise?     denies 8. MEDICINES: What have you taken so far for the pain? (e.g., nothing, acetaminophen , NSAIDS)     ASA - does not help 9. NEUROLOGIC SYMPTOMS: Do you have any weakness, numbness, or problems with bowel/bladder control?     denies 10. OTHER SYMPTOMS: Do you have any other symptoms? (e.g., fever, abdomen pain, burning with urination, blood in urine)       denies  Protocols used: Back Pain-A-AH

## 2024-06-25 NOTE — Telephone Encounter (Signed)
 Noted  Pt has appt.  KP

## 2024-06-26 ENCOUNTER — Ambulatory Visit
Admission: RE | Admit: 2024-06-26 | Discharge: 2024-06-26 | Disposition: A | Source: Ambulatory Visit | Attending: Internal Medicine | Admitting: Internal Medicine

## 2024-06-26 ENCOUNTER — Ambulatory Visit
Admission: RE | Admit: 2024-06-26 | Discharge: 2024-06-26 | Disposition: A | Attending: Internal Medicine | Admitting: Internal Medicine

## 2024-06-26 ENCOUNTER — Encounter: Payer: Self-pay | Admitting: Internal Medicine

## 2024-06-26 ENCOUNTER — Ambulatory Visit: Admitting: Internal Medicine

## 2024-06-26 VITALS — BP 124/60 | HR 60 | Ht 67.0 in | Wt 179.0 lb

## 2024-06-26 DIAGNOSIS — M4716 Other spondylosis with myelopathy, lumbar region: Secondary | ICD-10-CM | POA: Diagnosis not present

## 2024-06-26 MED ORDER — PREDNISONE 10 MG PO TABS: ORAL_TABLET | ORAL | 0 refills | Status: DC

## 2024-06-26 MED ORDER — BACLOFEN 10 MG PO TABS: 10.0000 mg | ORAL_TABLET | Freq: Three times a day (TID) | ORAL | 0 refills | Status: DC

## 2024-06-26 NOTE — Progress Notes (Signed)
 Date:  06/26/2024   Name:  Todd Weaver   DOB:  01-11-1951   MRN:  [id number]   Chief Complaint: Back Pain (Low back pain x 1 week. Patient has a constant ache. He has ben taking Bayer Asprin for back pain which helps a little. Aggravating factors going from sit to stand and getting out of the bed. Pain level 9/10. HX of Sx. No leg pain. No numbness or tingling. No bowel or bladder changes. )  Back Pain This is a chronic problem. The problem has been rapidly worsening (after a fall 2 days ago) since onset. The quality of the pain is described as aching and cramping. The pain is moderate. The symptoms are aggravated by sitting and twisting. Pertinent negatives include no abdominal pain, bladder incontinence, bowel incontinence, chest pain, headaches, leg pain, numbness, paresthesias, perianal numbness or weakness. He has tried heat and analgesics for the symptoms. The treatment provided mild relief.    Review of Systems  Constitutional:  Negative for chills, fatigue and unexpected weight change.  HENT:  Negative for nosebleeds.   Eyes:  Negative for visual disturbance.  Respiratory:  Negative for cough, chest tightness, shortness of breath and wheezing.   Cardiovascular:  Negative for chest pain, palpitations and leg swelling.  Gastrointestinal:  Negative for abdominal pain, bowel incontinence, constipation and diarrhea.  Genitourinary:  Negative for bladder incontinence and urgency.  Musculoskeletal:  Positive for back pain and gait problem.  Neurological:  Negative for dizziness, weakness, light-headedness, numbness, headaches and paresthesias.  Psychiatric/Behavioral:  Negative for sleep disturbance.      Lab Results  Component Value Date   NA 136 04/27/2024   K 4.6 04/27/2024   CO2 21 04/27/2024   GLUCOSE 108 (H) 04/27/2024   BUN 15 04/27/2024   CREATININE 1.22 04/27/2024   CALCIUM  9.6 04/27/2024   EGFR 63 04/27/2024   GFRNONAA >60 02/13/2024   Lab Results  Component  Value Date   CHOL 159 04/27/2024   HDL 60 04/27/2024   LDLCALC 89 04/27/2024   LDLDIRECT 116 (H) 09/24/2017   TRIG 46 04/27/2024   CHOLHDL 2.7 04/27/2024   Lab Results  Component Value Date   TSH 1.530 04/27/2024   Lab Results  Component Value Date   HGBA1C 6.1 (H) 04/27/2024   Lab Results  Component Value Date   WBC 6.0 02/13/2024   HGB 13.4 02/13/2024   HCT 39.9 02/13/2024   MCV 81.1 02/13/2024   PLT 237 02/13/2024   Lab Results  Component Value Date   ALT 28 04/27/2024   AST 28 04/27/2024   ALKPHOS 61 04/27/2024   BILITOT 0.9 04/27/2024   No results found for: MARIEN BOLLS, VD25OH   Patient Active Problem List   Diagnosis Date Noted   Hematuria 12/02/2023   Prediabetes 04/24/2023   Subareolar gynecomastia in male 02/01/2023   Cervical spinal stenosis 08/22/2022   Primary insomnia 06/18/2022   Contrast media allergy 12/12/2021   GERD (gastroesophageal reflux disease) 12/12/2021   Ischemic cardiomyopathy 12/12/2021   Urinary hesitancy 12/26/2020   Carpal tunnel syndrome on both sides 12/26/2020   Chronic systolic congestive heart failure (HCC) 09/29/2020   Thrombophilia 09/29/2020   PAF (paroxysmal atrial fibrillation) (HCC) 04/11/2020   PAD (peripheral artery disease) 03/31/2019   Rectal polyp    Impingement syndrome of left shoulder region 10/14/2018   Coronary artery disease of native artery of native heart with stable angina pectoris 10/03/2018   Essential hypertension 06/19/2017   Mixed hyperlipidemia  06/19/2017   Overweight (BMI 25.0-29.9) 06/19/2017   BPH (benign prostatic hyperplasia) 06/19/2017   ED (erectile dysfunction) 06/19/2017   Lumbar spondylosis with myelopathy 11/08/2016   Hx of transient ischemic attack (TIA) 2011    Allergies  Allergen Reactions   Codeine Hives and Other (See Comments)   Shellfish Allergy Hives and Swelling   Contrast Media [Iodinated Contrast Media] Itching and Other (See Comments)    Whelts on  tongue   Fish Allergy Itching    Past Surgical History:  Procedure Laterality Date   ANAL FISTULECTOMY N/A 12/21/2014   Procedure: FISTULECTOMY ANAL;  Surgeon: Louanne KANDICE Muse, MD;  Location: [facility];  Service: General;  Laterality: N/A;   ANTERIOR CERVICAL DECOMP/DISCECTOMY FUSION N/A 08/22/2022   Procedure: C4-6 ANTERIOR CERVICAL DISCECTOMY AND FUSION (NUVASIVE ACP, GLOBUS HEDRON);  Surgeon: Clois Fret, MD;  Location: [facility];  Service: Neurosurgery;  Laterality: N/A;   APPENDECTOMY  1975   CARDIAC CATHETERIZATION     CARPAL TUNNEL RELEASE Left 07/04/2023   Procedure: CARPAL TUNNEL RELEASE ENDOSCOPIC;  Surgeon: Edie Norleen PARAS, MD;  Location: [facility];  Service: Orthopedics;  Laterality: Left;   CARPAL TUNNEL RELEASE Right 02/19/2024   Procedure: RELEASE, CARPAL TUNNEL, ENDOSCOPIC;  Surgeon: Edie Norleen PARAS, MD;  Location: [facility];  Service: Orthopedics;  Laterality: Right;   COLONOSCOPY WITH PROPOFOL  N/A 02/12/2019   Procedure: COLONOSCOPY WITH PROPOFOL ;  Surgeon: Jinny Carmine, MD;  Location: [facility];  Service: Endoscopy;  Laterality: N/A;   CORONARY STENT INTERVENTION N/A 09/09/2017   Procedure: CORONARY STENT INTERVENTION;  Surgeon: Darron Deatrice LABOR, MD;  Location: [facility];  Service: Cardiovascular;  Laterality: N/A;   CORONARY ULTRASOUND/IVUS N/A 09/09/2017   Procedure: Intravascular Ultrasound/IVUS;  Surgeon: Darron Deatrice LABOR, MD;  Location: [facility];  Service: Cardiovascular;  Laterality: N/A;   CORONARY/GRAFT ACUTE MI REVASCULARIZATION N/A 07/18/2017   Procedure: Coronary/Graft Acute MI Revascularization;  Surgeon: Darron Deatrice LABOR, MD;  Location: [facility];  Service: Cardiovascular;  Laterality: N/A;   HEMORRHOID SURGERY N/A 12/21/2014   Procedure: HEMORRHOIDECTOMY;  Surgeon: Louanne KANDICE Muse, MD;  Location: [facility];  Service: General;  Laterality: N/A;   KNEE SURGERY Right age 84   LEFT HEART CATH AND CORONARY ANGIOGRAPHY  N/A 07/18/2017   Procedure: LEFT HEART CATH AND CORONARY ANGIOGRAPHY;  Surgeon: Darron Deatrice LABOR, MD;  Location: [facility];  Service: Cardiovascular;  Laterality: N/A;   LEFT HEART CATH AND CORONARY ANGIOGRAPHY Left 08/19/2017   Procedure: LEFT HEART CATH AND CORONARY ANGIOGRAPHY;  Surgeon: Darron Deatrice LABOR, MD;  Location: [facility];  Service: Cardiovascular;  Laterality: Left;   LEFT HEART CATH AND CORONARY ANGIOGRAPHY N/A 11/07/2017   Procedure: LEFT HEART CATH AND CORONARY ANGIOGRAPHY;  Surgeon: Darron Deatrice LABOR, MD;  Location: [facility];  Service: Cardiovascular;  Laterality: N/A;   LEFT HEART CATH AND CORONARY ANGIOGRAPHY N/A 10/17/2021   Procedure: LEFT HEART CATH AND CORONARY ANGIOGRAPHY;  Surgeon: Darron Deatrice LABOR, MD;  Location: [facility];  Service: Cardiovascular;  Laterality: N/A;   ROTATOR CUFF REPAIR Right 2013   TRIGGER FINGER RELEASE Right 02/19/2024   Procedure: RELEASE, A1 PULLEY, FOR TRIGGER FINGER;  Surgeon: Edie Norleen PARAS, MD;  Location: [facility];  Service: Orthopedics;  Laterality: Right;    Social History   Tobacco Use   Smoking status: Former    Current packs/day: 0.00    Average packs/day: 1 pack/day for 38.0 years (38.0 ttl  pk-yrs)    Types: Cigarettes    Start date: 69    Quit date: 07/24/2007    Years since quitting: 16.9    Passive exposure: Past   Smokeless tobacco: Former    Types: Chew    Quit date: 2005   Tobacco comments:    smoking cessation materials not required  Vaping Use   Vaping status: Never Used  Substance Use Topics   Alcohol use: Yes    Alcohol/week: 7.0 - 14.0 standard drinks of alcohol    Types: 7 - 14 Cans of beer per week    Comment: 1-2 beers daily   Drug use: No     Medication list has been reviewed and updated.  Current Meds  Medication Sig   acetaminophen  (TYLENOL ) 500 MG tablet Take 500-1,000 mg by mouth every 6 (six) hours as needed (pain.).   aspirin  EC 81 MG tablet Take 81  mg by mouth in the morning.   baclofen  (LIORESAL ) 10 MG tablet Take 1 tablet (10 mg total) by mouth 3 (three) times daily.   carvedilol  (COREG ) 3.125 MG tablet TAKE 1 TABLET TWICE DAILY WITH MEALS   ezetimibe  (ZETIA ) 10 MG tablet Take 1 tablet (10 mg total) by mouth every evening.   nitroGLYCERIN  (NITROSTAT ) 0.4 MG SL tablet Place 1 tablet (0.4 mg total) under the tongue every 5 (five) minutes as needed for chest pain.   omeprazole  (PRILOSEC) 40 MG capsule Take 1 capsule (40 mg total) by mouth every evening.   oxybutynin  (DITROPAN -XL) 10 MG 24 hr tablet TAKE 1 TABLET AT BEDTIME   predniSONE  (DELTASONE ) 10 MG tablet Take 6 tablets (60 mg total) by mouth daily with breakfast for 1 day, THEN 5 tablets (50 mg total) daily with breakfast for 1 day, THEN 4 tablets (40 mg total) daily with breakfast for 1 day, THEN 3 tablets (30 mg total) daily with breakfast for 1 day, THEN 2 tablets (20 mg total) daily with breakfast for 1 day, THEN 1 tablet (10 mg total) daily with breakfast for 1 day.   rosuvastatin  (CRESTOR ) 40 MG tablet TAKE 1 TABLET EVERY DAY   sacubitril -valsartan  (ENTRESTO ) 49-51 MG TAKE 1 TABLET TWICE DAILY   tamsulosin  (FLOMAX ) 0.4 MG CAPS capsule TAKE 1 CAPSULE EVERY DAY       04/27/2024    7:57 AM 12/02/2023    3:01 PM 09/30/2023    8:14 AM 04/24/2023    9:43 AM  GAD 7 : Generalized Anxiety Score  Nervous, Anxious, on Edge 0 1 1 0  Control/stop worrying 0 0 0 0  Worry too much - different things 0 0 0 0  Trouble relaxing 0 1 2 1   Restless 0 1 2 1   Easily annoyed or irritable 0 1 1 0  Afraid - awful might happen 0 0 0 0  Total GAD 7 Score 0 4 6 2   Anxiety Difficulty Not difficult at all Not difficult at all  Not difficult at all       06/26/2024    8:34 AM 04/27/2024    7:57 AM 01/15/2024    1:24 PM  Depression screen PHQ 2/9  Decreased Interest 0 0 0  Down, Depressed, Hopeless 0 0 0  PHQ - 2 Score 0 0 0  Altered sleeping  3 3  Tired, decreased energy  3 2  Change in appetite   0 0  Feeling bad or failure about yourself   0 0  Trouble concentrating  0 0  Moving slowly  or fidgety/restless  0 0  Suicidal thoughts  0 0  PHQ-9 Score  6  5   Difficult doing work/chores  Not difficult at all Not difficult at all     Data saved with a previous flowsheet row definition    BP Readings from Last 3 Encounters:  06/26/24 124/60  04/27/24 128/78  02/19/24 129/84    Physical Exam Vitals and nursing note reviewed.  Constitutional:      General: He is not in acute distress.    Appearance: Normal appearance. He is well-developed.  HENT:     Head: Normocephalic and atraumatic.  Cardiovascular:     Rate and Rhythm: Normal rate and regular rhythm.  Pulmonary:     Effort: Pulmonary effort is normal. No respiratory distress.     Breath sounds: No wheezing or rhonchi.  Musculoskeletal:     Lumbar back: Spasms, tenderness and bony tenderness present. Decreased range of motion. Negative right straight leg raise test and negative left straight leg raise test.       Back:     Comments: Surgical scar healed Tender over the SI regions and mid spine over the healed site  Skin:    General: Skin is warm and dry.     Findings: No rash.  Neurological:     Mental Status: He is alert and oriented to person, place, and time.     Motor: Motor function is intact.     Deep Tendon Reflexes:     Reflex Scores:      Patellar reflexes are 2+ on the right side and 2+ on the left side. Psychiatric:        Mood and Affect: Mood normal.        Behavior: Behavior normal.     Wt Readings from Last 3 Encounters:  06/26/24 179 lb (81.2 kg)  04/27/24 175 lb (79.4 kg)  01/15/24 185 lb (83.9 kg)    BP 124/60   Pulse 60   Ht 5' 7 (1.702 m)   Wt 179 lb (81.2 kg)   SpO2 96%   BMI 28.04 kg/m   Assessment and Plan:  Problem List Items Addressed This Visit       Unprioritized   Lumbar spondylosis with myelopathy - Primary   Hx of laminectomy?  S/p fall 2 days ago with twisting  while rabbit hunting. Taking ASA and using heat Will get imaging; start Baclofen  tid and steroid taper If no improvement next week, will refer.      Relevant Medications   baclofen  (LIORESAL ) 10 MG tablet   predniSONE  (DELTASONE ) 10 MG tablet   Other Relevant Orders   DG Lumbar Spine 2-3 Views    No follow-ups on file.    Leita HILARIO Adie, MD [facility] [place]

## 2024-06-26 NOTE — Assessment & Plan Note (Signed)
 Hx of laminectomy?  S/p fall 2 days ago with twisting while rabbit hunting. Taking ASA and using heat Will get imaging; start Baclofen  tid and steroid taper If no improvement next week, will refer.

## 2024-07-02 ENCOUNTER — Ambulatory Visit: Payer: Self-pay | Admitting: Internal Medicine

## 2024-07-02 NOTE — Progress Notes (Unsigned)
 Cardiology Office Note    Date:  07/07/2024   ID:  Todd Weaver, DOB [birth year], MRN [id number]  PCP:  Todd Leita DEL, MD  Cardiologist:  Deatrice Cage, MD  Electrophysiologist:  None   Chief Complaint: Follow up  History of Present Illness:   Todd Weaver is a 73 y.o. male with history of CAD with inferior ST elevation MI complicated by ventricular fibrillation arrest in 06/2017 status post PCI/DES with overlapping drug-eluting stent placement to the RCA and A-fib post procedure status post staged IVUS guided DES to the proximal and ostial LAD in 08/2017, chronic combined systolic and diastolic CHF, CVA, PAD followed by vascular surgery, HTN, HLD, prior tobacco use, bilateral carpal tunnel syndrome status post repair, and BPH who presents for follow-up of CAD and cardiomyopathy.  He was admitted in 06/2017 with an inferior ST elevation MI complicated by ventricular fibrillation arrest.  He underwent successful PCI/DES with 2 overlapping drug-eluting stents to the RCA.  LHC at that time also demonstrated significant ostial LAD stenosis, borderline significant proximal LAD stenosis, and moderate distal LAD stenosis.  LVEF was mildly reduced.  Postprocedure, he had A-fib that was successfully treated with amiodarone .  He underwent staged IVUS guided PCI/DES to the ostial and proximal LAD in 08/2017.  Repeat LHC in 10/2017 showed widely patent LAD and RCA stents with no significant restenosis.  The ramus branch was pinched by the LAD stent with 70% ostial stenosis.  LVEF was 50 to 55%.  Nuclear stress test in 08/2019 showed no evidence of ischemia with normal ejection fraction.  Echo in 09/2019 showed an EF of 45 to 50% with mild mitral regurgitation.  Lexiscan  MPI on 01/18/2021 which was overall low risk and showed no evidence of ischemia.  LVEF appeared visually normal, though calculated value was mildly reduced at 47%.  In the setting of continued left arm and shoulder discomfort and exertional  fatigue he underwent LHC on 10/17/2021 that demonstrated widely patent RCA and LAD stents with no significant restenosis.  Stable 70% stenosis in the ostial ramus.  Otherwise, no obstructive disease.  Mildly reduced LV systolic function and mildly elevated LVEDP were noted.  Medical therapy was recommended.  Subsequent left upper extremity venous duplex demonstrated a chronic thrombus involving one of the left brachial veins.  Case was discussed with the patient's primary cardiologist with no intervention indicated.  Subsequent hypercoagulable workup was unrevealing.  Left upper extremity arterial ultrasound showed triphasic arterial flow throughout.  Echo on 11/28/2021 demonstrated an EF of 45 to 50%, global hypokinesis, grade 2 diastolic dysfunction, normal RV systolic function and ventricular cavity size, no significant valvular abnormalities, and an estimated right atrial pressure of 3 mmHg.  Limited echo in 03/2022 demonstrated an EF of 45 to 50%, global hypokinesis, grade 2 diastolic dysfunction, normal RV systolic function, and no significant valvular abnormalities.  Escalation of GDMT has had times been limited by orthostasis and constipation.  He was seen in the office in 10/2023 noting an approximate 69-month history of exertional fatigue and shortness of breath without frank chest pain.  He did notice some left shoulder and arm discomfort when he was laying on his left side that improved with changing sleeping positions.  Lexiscan  MPI on 11/15/2023 showed no evidence of ischemia or infarction and was overall low risk.  Echo on 12/18/2023 showed an EF of 50 to 55%, no regional wall motion abnormalities, normal LV diastolic function parameters, normal RV systolic function and ventricular cavity size, no significant  valvular abnormalities, and an estimated right atrial pressure of 3 mmHg.  He was last seen in the office in 12/2023 and was without further episodes of chest discomfort.  Upon medication reconciliation,  he indicated he had not taken amlodipine  or spironolactone  for several months with deferment of reinitiation given well-controlled blood pressure with history of orthostasis and in the context of improved LV systolic function.  He comes in doing well from a cardiac perspective and is without symptoms of angina or cardiac decompensation.  Chronic dyspnea is stable.  No dizziness, presyncope, or syncope.  He did suffer a mechanical fall while rabbit hunting recently leading to back pain.  He did not hit his head or suffer LOC.  No symptoms concerning for bleeding.  No lower extremity swelling or progressive orthopnea.  Continues to struggle with staying asleep.  Spouse reports intermittent snoring in the patient.  He has not needed SL NTG in several years.   Labs independently reviewed: 04/2024 - TSH normal, TC 159, TG 46, HDL 60, LDL 89, A1c 6.1, BUN 15, serum creatinine 1.22, potassium 4.6, albumin 4.5, AST/ALT normal 01/2024 - Hgb 13.4, PLT 237  Past Medical History:  Diagnosis Date   Acute ST elevation myocardial infarction (STEMI) of inferior wall (HCC) 07/18/2017   a.) LHC/PCI 07/18/2017: 99% o-mRCA --> overlapping 3.0 x 38 mm and 3.25 x 18 mm Xience Sierra DES   Alcohol dependence, daily use (HCC)    a.) 3-4 beers daily   Angina pectoris 11/06/2017   Bilateral carpal tunnel syndrome    BPH (benign prostatic hyperplasia) 06/19/2017   Bradycardia    Cardiac arrest with ventricular fibrillation (HCC) 07/18/2017   a.) in setting of acute inferior STEMI; required defib in field by EMS and again in the ED   Cervical spinal stenosis    a.) s/p ACDF C4-C6 08/22/2022   Chronic deep vein thrombosis (DVT) of brachial vein of left upper extremity (HCC)    Combined systolic and diastolic cardiac dysfunction 09/29/2019   a.) TTE 09/29/2019: EF 45-50%, glob HK, mild MR, mild PA dil, G2DD; b.) TTE 11/28/2021: EF 45-50%, glob HK, G2DD; c.) TTE 04/18/2022: EF 45-50%, glob HK, G2DD   Coronary artery  disease of native artery of native heart with stable angina pectoris 07/18/2017   a.) LHC/PCI 07/18/17: 99 o-mRCA (overlapping 3.0x52mm & 3.25x4mm Xience Sierra DES), 60 post atrio, 85 dLM-oLAD, 40 pLCx, 60 pLAD, 40 mLAD; b.) LHC 08/19/17: 85 dLM-oLAD (PTCA), 60 pLAD, 30 mLAD, 40 pLCx, 50 RI; c.) LHC/PCI 09/09/17: unchanged LHC. 3.0 x 28mm Xience Sierra DES dLM-oLAD; d.) LHC 11/07/17: 30 mLM, 40 pLCx, 70 RI, 40 dLAD - Rx mgmt; e.) LHC 10/17/21: 70 oRI, 20 mLAD, 30 pLCx - Rx mgmt   ED (erectile dysfunction) 06/19/2017   a.) on PDE5i (sildenafil )   Former smoker    GERD (gastroesophageal reflux disease)    Hemorrhoid    Hyperlipidemia    Hypertension    Impingement syndrome of left shoulder region 10/14/2018   Ischemic cardiomyopathy    a.) TTE 07/18/2017: EF 45-50%; b.) LHC 08/19/2017: EF 45%; c.) TTE 09/29/2019: EF 45-50%; d.) MPI 01/18/2021: EF 47%; e.) TTE 11/28/2021: EF 45-50%; f.) TTE 04/18/2022:  EF 45-50%   Long term current use of aspirin     Long term current use of clopidogrel     Lumbar spondylosis with myelopathy 11/08/2016   MCI (mild cognitive impairment) 06/19/2017   Myelomalacia of cervical cord (HCC)    PAD (peripheral artery disease)    a.  01/2019 ABIs and Duplex: ABI R 0.78, L 0.79; TBI R 0.66, L 1.11. RCIA >50p, RSFA 50-74. LCIA >50p, LSFA 75-99p, 30-49d. 3 vessel runoff bilat.   PAF (paroxysmal atrial fibrillation) (HCC)    a.) brief episode during 06/2017 hospitalization for inferior STEMI --> converted to NSR with IV amiodarone  --> not on [facility]   Rectal polyp    Stroke (HCC) 09/26/2005   a.) seen acutely on brain MRI 09/26/2005 --> posterior frontal, mid parietal lobe, RIGHT occipital lobe --> tiny ischemic infarctions secondary to a shower of emboli  (watershed ischemic pattern may be present)   Subareolar gynecomastia in male    Thrombophilia 09/29/2020   Urinary hesitancy 12/26/2020    Past Surgical History:  Procedure Laterality Date   ANAL FISTULECTOMY N/A 12/21/2014    Procedure: FISTULECTOMY ANAL;  Surgeon: Louanne KANDICE Muse, MD;  Location: [facility] OR[facility];  Service: General;  Laterality: N/A;   ANTERIOR CERVICAL DECOMP/DISCECTOMY FUSION N/A 08/22/2022   Procedure: C4-6 ANTERIOR CERVICAL DISCECTOMY AND FUSION (NUVASIVE ACP, GLOBUS HEDRON);  Surgeon: Clois Fret, MD;  Location: [facility];  Service: Neurosurgery;  Laterality: N/A;   APPENDECTOMY  1975   CARDIAC CATHETERIZATION     CARPAL TUNNEL RELEASE Left 07/04/2023   Procedure: CARPAL TUNNEL RELEASE ENDOSCOPIC;  Surgeon: Edie Norleen PARAS, MD;  Location: [facility];  Service: Orthopedics;  Laterality: Left;   CARPAL TUNNEL RELEASE Right 02/19/2024   Procedure: RELEASE, CARPAL TUNNEL, ENDOSCOPIC;  Surgeon: Edie Norleen PARAS, MD;  Location: [facility];  Service: Orthopedics;  Laterality: Right;   COLONOSCOPY WITH PROPOFOL  N/A 02/12/2019   Procedure: COLONOSCOPY WITH PROPOFOL ;  Surgeon: Jinny Carmine, MD;  Location: [facility];  Service: Endoscopy;  Laterality: N/A;   CORONARY STENT INTERVENTION N/A 09/09/2017   Procedure: CORONARY STENT INTERVENTION;  Surgeon: Darron Deatrice LABOR, MD;  Location: [facility];  Service: Cardiovascular;  Laterality: N/A;   CORONARY ULTRASOUND/IVUS N/A 09/09/2017   Procedure: Intravascular Ultrasound/IVUS;  Surgeon: Darron Deatrice LABOR, MD;  Location: [facility];  Service: Cardiovascular;  Laterality: N/A;   CORONARY/GRAFT ACUTE MI REVASCULARIZATION N/A 07/18/2017   Procedure: Coronary/Graft Acute MI Revascularization;  Surgeon: Darron Deatrice LABOR, MD;  Location: [facility];  Service: Cardiovascular;  Laterality: N/A;   HEMORRHOID SURGERY N/A 12/21/2014   Procedure: HEMORRHOIDECTOMY;  Surgeon: Louanne KANDICE Muse, MD;  Location: [facility];  Service: General;  Laterality: N/A;   KNEE SURGERY Right age 89   LEFT HEART CATH AND CORONARY ANGIOGRAPHY N/A 07/18/2017   Procedure: LEFT HEART CATH AND CORONARY ANGIOGRAPHY;  Surgeon: Darron Deatrice LABOR, MD;  Location: [facility]  INV[facility]IVE [facility];  Service: Cardiovascular;  Laterality: N/A;   LEFT HEART CATH AND CORONARY ANGIOGRAPHY Left 08/19/2017   Procedure: LEFT HEART CATH AND CORONARY ANGIOGRAPHY;  Surgeon: Darron Deatrice LABOR, MD;  Location: [facility];  Service: Cardiovascular;  Laterality: Left;   LEFT HEART CATH AND CORONARY ANGIOGRAPHY N/A 11/07/2017   Procedure: LEFT HEART CATH AND CORONARY ANGIOGRAPHY;  Surgeon: Darron Deatrice LABOR, MD;  Location: [facility];  Service: Cardiovascular;  Laterality: N/A;   LEFT HEART CATH AND CORONARY ANGIOGRAPHY N/A 10/17/2021   Procedure: LEFT HEART CATH AND CORONARY ANGIOGRAPHY;  Surgeon: Darron Deatrice LABOR, MD;  Location: [facility];  Service: Cardiovascular;  Laterality: N/A;   ROTATOR CUFF REPAIR Right 2013   TRIGGER FINGER RELEASE Right 02/19/2024   Procedure: RELEASE, A1 PULLEY, FOR TRIGGER FINGER;  Surgeon: Edie Norleen PARAS, MD;  Location: [facility];  Service: Orthopedics;  Laterality: Right;    Current Medications: Active Medications[1]  Allergies:   Codeine, Shellfish allergy, Contrast media [iodinated contrast media], and Fish allergy   Social History   Socioeconomic History   Marital status: Married    Spouse name: Merlynn Caldron   Number of children: 2   Years of education: Not on file   Highest education level: 11th grade  Occupational History   Occupation: Retired  Tobacco Use   Smoking status: Former    Current packs/day: 0.00    Average packs/day: 1 pack/day for 38.0 years (38.0 ttl pk-yrs)    Types: Cigarettes    Start date: 62    Quit date: 07/24/2007    Years since quitting: 16.9    Passive exposure: Past   Smokeless tobacco: Former    Types: Chew    Quit date: 2005   Tobacco comments:    smoking cessation materials not required  Vaping Use   Vaping status: Never Used  Substance and Sexual Activity   Alcohol use: Yes    Alcohol/week: 7.0 - 14.0 standard drinks of alcohol    Types: 7 - 14 Cans of beer per week    Comment:  1-2 beers daily   Drug use: No   Sexual activity: Not Currently  Other Topics Concern   Not on file  Social History Narrative   Lives in [place]. Retired.  Completed cardiac rehab program.   Social Drivers of Health   Tobacco Use: Medium Risk (07/07/2024)   Patient History    Smoking Tobacco Use: Former    Smokeless Tobacco Use: Former    Passive Exposure: Past  Programmer, Applications: Low Risk  (02/17/2024)   Received from [facility]   Overall Financial Resource Strain (CARDIA)    Difficulty of Paying Living Expenses: Not hard at all  Food Insecurity: No Food Insecurity (02/17/2024)   Received from [facility]   Epic    Within the past 12 months, you worried that your food would run out before you got the money to buy more.: Never true    Within the past 12 months, the food you bought just didn't last and you didn't have money to get more.: Never true  Transportation Needs: No Transportation Needs (02/17/2024)   Received from [facility] - Transportation    In the past 12 months, has lack of transportation kept you from medical appointments or from getting medications?: No    Lack of Transportation (Non-Medical): No  Physical Activity: Inactive (01/15/2024)   Exercise Vital Sign    Days of Exercise per Week: 0 days    Minutes of Exercise per Session: 0 min  Stress: No Stress Concern Present (01/15/2024)   [employer] - Occupational Stress Questionnaire    Feeling of Stress: Not at all  Social Connections: Socially Integrated (01/15/2024)   Social Connection and Isolation Panel    Frequency of Communication with Friends and Family: More than three times a week    Frequency of Social Gatherings with Friends and Family: More than three times a week    Attends Religious Services: More than 4 times per year    Active Member of Clubs or Organizations: Yes    Attends Banker  Meetings: More than 4 times per year    Marital Status: Married  Depression (PHQ2-9): Low Risk (06/26/2024)   Depression (PHQ2-9)    PHQ-2 Score: 0  Recent  Concern: Depression (PHQ2-9) - Medium Risk (04/27/2024)   Depression (PHQ2-9)    PHQ-2 Score: 6  Alcohol Screen: Low Risk (01/15/2024)   Alcohol Screen    Last Alcohol Screening Score (AUDIT): 4  Housing: Low Risk  (02/17/2024)   [facility]   Epic    In the last 12 months, was there a time when you were not able to pay the mortgage or rent on time?: No    In the past 12 months, how many times have you moved where you were living?: 0    At any time in the past 12 months, were you homeless or living in a shelter (including now)?: No  Utilities: Not At Risk (02/17/2024)   Received from [facility]   Epic    In the past 12 months has the electric, gas, oil, or water company threatened to shut off services in your home?: No  Health Literacy: Adequate Health Literacy (01/15/2024)   B1300 Health Literacy    Frequency of need for help with medical instructions: Never     Family History:  The patient's family history includes Alzheimer's disease in his father and mother; Hypertension in his father and mother. There is no history of Breast cancer.  ROS:   12-point review of systems is negative unless otherwise noted in the HPI.   EKGs/Labs/Other Studies Reviewed:    Studies reviewed were summarized above. The additional studies were reviewed today:  2D echo 12/18/2023: 1. Left ventricular ejection fraction, by estimation, is 50 to 55%. The  left ventricle has low normal function. The left ventricle has no regional  wall motion abnormalities. Left ventricular diastolic parameters were  normal.   2. Right ventricular systolic function is normal. The right ventricular  size is normal.   3. The mitral valve is normal in structure. No evidence of mitral valve  regurgitation.   4. The  aortic valve is grossly normal. Aortic valve regurgitation is not  visualized.   5. The inferior vena cava is normal in size with greater than 50%  respiratory variability, suggesting right atrial pressure of 3 mmHg.  __________   Lexiscan  MPI 11/15/2023:   The study is normal. The study is low risk.   No ST deviation was noted.   LV perfusion is normal. There is no evidence of ischemia. There is no evidence of infarction.   Left ventricular function is normal. End diastolic cavity size is normal. End systolic cavity size is normal. __________   Limited echo 04/18/2022: 1. Left ventricular ejection fraction, by estimation, is 45 to 50%. The  left ventricle has mildly decreased function. The left ventricle  demonstrates global hypokinesis. Left ventricular diastolic parameters are  consistent with Grade II diastolic  dysfunction (pseudonormalization).   2. Right ventricular systolic function is normal.   3. The mitral valve is normal in structure. No evidence of mitral valve  regurgitation.   Comparison(s): Previous LVEF reported as 45-50%  Previous GLS reported as -17.5.  __________   Zio patch 11/2021: Patient had a min HR of 42 bpm, max HR of 136 bpm, and avg HR of 66 bpm. Predominant underlying rhythm was Sinus Rhythm.  1 run of Supraventricular Tachycardia occurred lasting 6 beats with a max rate of 135 bpm (avg 105 bpm). Rare PACs and rare PVCs with a burden of less than 1%. No significant arrhythmia overall. __________   2D echo 11/28/2021: 1. Left ventricular ejection fraction, by estimation, is 45 to  50%. The  left ventricle has mildly decreased function. The left ventricle  demonstrates global hypokinesis. Left ventricular diastolic parameters are  consistent with Grade II diastolic  dysfunction (pseudonormalization). The average left ventricular global  longitudinal strain is -17.5 %. The global longitudinal strain is normal.   2. Right ventricular systolic function is  normal. The right ventricular  size is normal.   3. The mitral valve is normal in structure. No evidence of mitral valve  regurgitation. No evidence of mitral stenosis.   4. The aortic valve is normal in structure. Aortic valve regurgitation is  not visualized. No aortic stenosis is present.   5. The inferior vena cava is normal in size with greater than 50%  respiratory variability, suggesting right atrial pressure of 3 mmHg. __________   LHC 10/17/2021:   Ost Ramus lesion is 70% stenosed.   Mid LAD lesion is 20% stenosed.   Prox Cx lesion is 30% stenosed.   Non-stenotic Prox LAD lesion was previously treated.   Non-stenotic Ost RCA to Mid RCA lesion was previously treated.   There is mild left ventricular systolic dysfunction.   LV end diastolic pressure is mildly elevated.   The left ventricular ejection fraction is 45-50% by visual estimate.   1.  Widely patent RCA and LAD stents with no significant restenosis.  Stable 70% stenosis in ostial ramus.  No other obstructive disease. 2.  Mildly reduced LV systolic function and mildly elevated left ventricular end-diastolic pressure.   Recommendations: No culprit is identified for the patient's left arm pain.  Continue aggressive medical therapy for coronary artery disease. __________   Lexiscan  MPI 01/18/2021: T wave inversion was noted during stress in the V6, V5, II, III and aVF leads. There was no ST segment deviation noted during stress. The study is normal. This is a low risk study. The left ventricular ejection fraction visually appears normal, although calculated value is mildly decreased at 47%. Correlation with echo advised. There is no evidence for ischemia. __________   ABIs 02/05/2020: Bilateral ABIs appear essentially unchanged compared to prior study on  01/26/19. ABIs and right TBI are consistent with previous exam. Drop in left  TBI but still normal.     Summary:  Right: Resting right ankle-brachial index indicates  moderate right lower  extremity arterial disease. The right toe-brachial index is abnormal.   Left: Resting left ankle-brachial index indicates moderate left lower  extremity arterial disease. The left toe-brachial index is normal. __________   2D echo 09/29/2019: 1. Left ventricular ejection fraction, by estimation, is 45 to 50%. The  left ventricle has mildly decreased function. The left ventricle  demonstrates global hypokinesis. Left ventricular diastolic parameters are  consistent with Grade II diastolic  dysfunction (pseudonormalization). Elevated left atrial pressure.   2. Right ventricular systolic function is normal. The right ventricular  size is normal. Tricuspid regurgitation signal is inadequate for assessing  PA pressure.   3. The mitral valve is grossly normal. Mild mitral valve regurgitation.  No evidence of mitral stenosis.   4. The aortic valve is tricuspid. Aortic valve regurgitation is not  visualized. No aortic stenosis is present.   5. Mildly dilated pulmonary artery.   6. The inferior vena cava is normal in size with greater than 50%  respiratory variability, suggesting right atrial pressure of 3 mmHg. __________   2D echo 08/24/2019: There was no ST segment deviation noted during stress. The study is normal. This is a low risk study. The left ventricular ejection fraction  is normal (55-65%). __________   LHC 11/07/2017: Mid LAD lesion is 30% stenosed. Previously placed Prox LAD drug eluting stent is widely patent. Prox Cx lesion is 40% stenosed. Non-stenotic Ost RCA to Mid RCA lesion previously treated. Ost Ramus lesion is 70% stenosed. Dist LAD lesion is 40% stenosed. LV end diastolic pressure is mildly elevated. There is mild left ventricular systolic dysfunction. The left ventricular ejection fraction is 50-55% by visual estimate.   1.  Widely patent LAD and RCA stents with no significant restenosis.  There is a relatively small size ramus br which is  pinched by the LAD stent with 70% ostial stenosis.  No other obstructive disease. 2.  Low normal LV systolic function with an EF of 50-55% with mild inferior wall hypokinesis.  Mildly elevated left ventricular end-diastolic pressure.   Recommendations: Continue medical therapy.  Add sublingual nitroglycerin  to be used as needed. The patient can be discharged home from a cardiac standpoint. __________   [facility] 09/09/2017: Dist LM to Ost LAD lesion is 85% stenosed. Prox LAD lesion is 60% stenosed. Mid LAD lesion is 30% stenosed. Prox Cx lesion is 40% stenosed. Non-stenotic Ost RCA to Mid RCA lesion previously treated. Ost Ramus lesion is 50% stenosed. Post intervention, there is a 0% residual stenosis. Post intervention, there is a 0% residual stenosis. A drug-eluting stent was successfully placed using a STENT SIERRA 3.00 X 38 MM.   Successful IVUS guided drug-eluting stent placement to the proximal and ostial LAD with one long stent.   Recommendations: Dual antiplatelet therapy for at least one year.  Aggressive treatment of risk factors. __________   LHC 08/19/2017: Dist LM to Ost LAD lesion is 85% stenosed. Prox LAD lesion is 60% stenosed. Mid LAD lesion is 30% stenosed. Prox Cx lesion is 40% stenosed. Previously placed Ost RCA to Mid RCA drug eluting stent and stent (unknown type) is widely patent. Balloon angioplasty was performed. Ost Ramus lesion is 50% stenosed. There is mild left ventricular systolic dysfunction. LV end diastolic pressure is normal. The left ventricular ejection fraction is 45-50% by visual estimate.   1.  Significant underlying two-vessel coronary artery disease with widely patent stents in the right coronary artery without any restenosis.  Significant ostial LAD stenosis is unchanged with moderate disease in the proximal and mid segment. 2.  Mildly reduced LV systolic function with an EF of 45% with inferior wall hypokinesis.  Normal left ventricular  end-diastolic pressure.   Recommendations: Continue medical therapy for now.  I will discuss with my colleagues about management options of ostial LAD stenosis with PCI versus one-vessel CABG. __________   2D echo 07/18/2017: - Left ventricle: The cavity size was normal. There was mild    concentric hypertrophy. Systolic function was mildly reduced. The    estimated ejection fraction was in the range of 45% to 50%.    Moderate hypokinesis of the inferior and inferoseptal myocardium.    The study was not technically sufficient to allow evaluation of    LV diastolic dysfunction due to atrial fibrillation.  - Left atrium: The atrium was mildly dilated. __________   [facility] 09/18/2016: Ost RCA to Mid RCA lesion is 99% stenosed. A drug-eluting stent was successfully placed using a STENT SIERRA 3.00 X 38 MM. Post intervention, there is a 0% residual stenosis. A stent was successfully placed. Post Atrio lesion is 60% stenosed. Dist LM to Ost LAD lesion is 85% stenosed. Prox Cx lesion is 40% stenosed. Prox LAD lesion is 60% stenosed. Mid  LAD lesion is 40% stenosed.   1.  Significant two-vessel coronary artery disease with subtotal occlusion of the proximal right coronary artery which is the culprit for inferior ST elevation myocardial infarction.  There is also 85% ostial LAD stenosis.  There is a medium sized ramus branch with 60% ostial stenosis. 2.  Normal left ventricular end-diastolic pressure. 3.  Successful complex angioplasty and 2 overlapped drug-eluting stent placement to the proximal and ostial right coronary artery.   Recommendations: The patient had hypotension throughout PCI likely due to RV involvement.  He responded to IV fluids and norepinephrine  drip.  Continue dual antiplatelet therapy for at least one year.  Aggressive treatment of risk factors. Further ischemic evaluation of the LAD will be required in the near future.  Angiography was suboptimal given the patient's  significant tachycardia. This was an overall difficult procedure due to long diffuse disease. __________   2D echo 04/09/2016: - Left ventricle: The cavity size was normal. Wall thickness was    normal. Systolic function was normal. The estimated ejection    fraction was in the range of 55% to 60%. Wall motion was normal;    there were no regional wall motion abnormalities. Left    ventricular diastolic function parameters were normal.   Impressions:   - Normal study. __________   Lexiscan  MPI 04/06/2016: There was no ST segment deviation noted during stress. No T wave inversion was noted during stress. This is a low risk study. The left ventricular ejection fraction is normal (55-65%). Defect 1: There is a small defect of mild severity present in the apex location. This is patrially reversible and suspected to be due to an artifact given intense GI uptake which affected the quality of study. Correlate clinically   EKG:  EKG is ordered today.  The EKG ordered today demonstrates sinus bradycardia, 55 bpm, baseline artifact involving the anterior leads, nonspecific lateral ST-T changes  Recent Labs: 02/13/2024: Hemoglobin 13.4; Platelets 237 04/27/2024: ALT 28; BUN 15; Creatinine, Ser 1.22; Potassium 4.6; Sodium 136; TSH 1.530  Recent Lipid Panel    Component Value Date/Time   CHOL 159 04/27/2024 1054   TRIG 46 04/27/2024 1054   HDL 60 04/27/2024 1054   CHOLHDL 2.7 04/27/2024 1054   CHOLHDL 2.8 11/07/2017 0511   VLDL 4 11/07/2017 0511   LDLCALC 89 04/27/2024 1054   LDLDIRECT 116 (H) 09/24/2017 1338    PHYSICAL EXAM:    VS:  BP (!) 100/58 (BP Location: Left Arm, Patient Position: Sitting, Cuff Size: Normal)   Pulse (!) 55 Comment: 63 oximeter  Ht 5' 7 (1.702 m)   Wt 183 lb 6.4 oz (83.2 kg)   SpO2 98%   BMI 28.72 kg/m   BMI: Body mass index is 28.72 kg/m.  Physical Exam Vitals reviewed.  Constitutional:      Appearance: He is well-developed.  HENT:     Head:  Normocephalic and atraumatic.  Eyes:     General:        Right eye: No discharge.        Left eye: No discharge.  Cardiovascular:     Rate and Rhythm: Normal rate and regular rhythm.     Heart sounds: Normal heart sounds, S1 normal and S2 normal. Heart sounds not distant. No midsystolic click and no opening snap. No murmur heard.    No friction rub.  Pulmonary:     Effort: Pulmonary effort is normal. No respiratory distress.     Breath sounds: Normal breath sounds.  No decreased breath sounds, wheezing, rhonchi or rales.  Musculoskeletal:     Cervical back: Normal range of motion.     Right lower leg: No edema.     Left lower leg: No edema.     Comments: Back brace noted.  Skin:    General: Skin is warm and dry.     Nails: There is no clubbing.  Neurological:     Mental Status: He is alert and oriented to person, place, and time.  Psychiatric:        Speech: Speech normal.        Behavior: Behavior normal.        Thought Content: Thought content normal.        Judgment: Judgment normal.     Wt Readings from Last 3 Encounters:  07/07/24 183 lb 6.4 oz (83.2 kg)  06/26/24 179 lb (81.2 kg)  04/27/24 175 lb (79.4 kg)     ASSESSMENT & PLAN:   CAD involving the native coronary arteries without angina: He is doing well and without symptoms concerning for angina.  Recent Lexiscan  MPI was low risk without evidence of ischemia or infarction with preserved LV systolic function.  Continue aggressive risk factor modification and secondary prevention including aspirin  81 mg, clopidogrel  75 mg, carvedilol  3.125 mg twice daily, ezetimibe  10 mg, and rosuvastatin  40 mg.  We are undergoing initiation of PCSK9 inhibitor as outlined below with likely plans to discontinue ezetimibe  thereafter pending updated labs.  No indication for further ischemic testing at this time.  HFimpEF: Euvolemic, compensated.  Most recent echo showed low normal LV systolic function.  He has NYHA class I symptoms.  He  remains on carvedilol  3.125 mg twice daily and Entresto  49/51 mg twice daily.  Not requiring standing loop diuretic.  No longer on spironolactone  with recommendation to defer reinitiation given relative hypotension and improvement in LV systolic function.  Lone A-fib: Noted following MI and VF arrest.  Subsequent outpatient cardiac monitoring showed no further evidence of atrial arrhythmia.  Not on anticoagulation given isolated episode in the context of significant acute illness.  Should there be recurrence of A-fib, OAC will need to be revisited.  HTN: Blood pressure is well-controlled in the office today.  Continue pharmacotherapy as outlined above.  HLD: LDL 89 in 04/2024 with normal AST/ALT at that time.  Target LDL less than 70.  Start Repatha  140 mg injected subcutaneously every 2 weeks.  For now, continue ezetimibe  10 mg and rosuvastatin  40 mg.  Repeat fasting lipid panel 2 months after initiation of PCSK9 inhibitor.  May be able to discontinue ezetimibe  thereafter.  Dyspnea/sleep disordered breathing: Cardiac evaluation of dyspnea has demonstrated improvement in LV systolic function by echo in 11/2023 with normal LV diastolic function, normal RV systolic function and ventricular cavity size, no significant valvular abnormality, and normal CVP.  Lexiscan  MPI in 10/2023 showed no evidence of ischemia or infarction with normal LV systolic function.  Referred to pulmonology for further evaluation of dyspnea as well as for consideration of a sleep study.      Disposition: F/u with Dr. Darron or an APP in 3 months.   Medication Adjustments/Labs and Tests Ordered: Current medicines are reviewed at length with the patient today.  Concerns regarding medicines are outlined above. Medication changes, Labs and Tests ordered today are summarized above and listed in the Patient Instructions accessible in Encounters.   Signed, Bernardino Bring, PA-C 07/07/2024 10:00 AM     [facility] -  [place] [address] [place], [place] [address] [phone]     [1]  Current Meds  Medication Sig   aspirin  EC 81 MG tablet Take 81 mg by mouth in the morning.   carvedilol  (COREG ) 3.125 MG tablet TAKE 1 TABLET TWICE DAILY WITH MEALS   Evolocumab  (REPATHA  SURECLICK) 140 MG/ML SOAJ Inject 140 mg into the skin every 14 (fourteen) days.   ezetimibe  (ZETIA ) 10 MG tablet Take 1 tablet (10 mg total) by mouth every evening.   omeprazole  (PRILOSEC) 40 MG capsule Take 1 capsule (40 mg total) by mouth every evening.   oxybutynin  (DITROPAN -XL) 10 MG 24 hr tablet TAKE 1 TABLET AT BEDTIME   rosuvastatin  (CRESTOR ) 40 MG tablet TAKE 1 TABLET EVERY DAY   sacubitril -valsartan  (ENTRESTO ) 49-51 MG TAKE 1 TABLET TWICE DAILY   tamsulosin  (FLOMAX ) 0.4 MG CAPS capsule TAKE 1 CAPSULE EVERY DAY

## 2024-07-06 ENCOUNTER — Other Ambulatory Visit: Payer: Self-pay | Admitting: Internal Medicine

## 2024-07-06 DIAGNOSIS — E782 Mixed hyperlipidemia: Secondary | ICD-10-CM

## 2024-07-07 ENCOUNTER — Ambulatory Visit: Attending: Physician Assistant | Admitting: Physician Assistant

## 2024-07-07 ENCOUNTER — Encounter: Payer: Self-pay | Admitting: Physician Assistant

## 2024-07-07 VITALS — BP 100/58 | HR 55 | Ht 67.0 in | Wt 183.4 lb

## 2024-07-07 DIAGNOSIS — I10 Essential (primary) hypertension: Secondary | ICD-10-CM

## 2024-07-07 DIAGNOSIS — I4891 Unspecified atrial fibrillation: Secondary | ICD-10-CM

## 2024-07-07 DIAGNOSIS — I2510 Atherosclerotic heart disease of native coronary artery without angina pectoris: Secondary | ICD-10-CM

## 2024-07-07 DIAGNOSIS — R0602 Shortness of breath: Secondary | ICD-10-CM

## 2024-07-07 DIAGNOSIS — E785 Hyperlipidemia, unspecified: Secondary | ICD-10-CM

## 2024-07-07 DIAGNOSIS — I5020 Unspecified systolic (congestive) heart failure: Secondary | ICD-10-CM

## 2024-07-07 DIAGNOSIS — G4730 Sleep apnea, unspecified: Secondary | ICD-10-CM

## 2024-07-07 MED ORDER — REPATHA SURECLICK 140 MG/ML ~~LOC~~ SOAJ: 140.0000 mg | SUBCUTANEOUS | 3 refills | Status: AC

## 2024-07-07 MED ORDER — NITROGLYCERIN 0.4 MG SL SUBL: 0.4000 mg | SUBLINGUAL_TABLET | SUBLINGUAL | 3 refills | Status: AC | PRN

## 2024-07-07 NOTE — Patient Instructions (Signed)
 Medication Instructions:  Your physician recommends the following medication changes.  START TAKING: Repatha  140 mg injection (subcutaneous) every 2 weeks  *If you need a refill on your cardiac medications before your next appointment, please call your pharmacy*  Lab Work: None ordered at this time   Follow-Up: At [facility], you and your health needs are our priority.  As part of our continuing mission to provide you with exceptional heart care, our providers are all part of one team.  This team includes your primary Cardiologist (physician) and Advanced Practice Providers or APPs (Physician Assistants and Nurse Practitioners) who all work together to provide you with the care you need, when you need it.  Your next appointment:   3 month(s)  Provider:   You may see Deatrice Cage, MD or Bernardino Bring, PA-C

## 2024-07-08 NOTE — Telephone Encounter (Signed)
 Requested Prescriptions  Pending Prescriptions Disp Refills   rosuvastatin  (CRESTOR ) 40 MG tablet [Pharmacy Med Name: ROSUVASTATIN  CALCIUM  40 MG Oral Tablet] 90 tablet 0    Sig: TAKE 1 TABLET EVERY DAY     Cardiovascular:  Antilipid - Statins 2 Failed - 07/08/2024 11:40 AM      Failed - Lipid Panel in normal range within the last 12 months    Cholesterol, Total  Date Value Ref Range Status  04/27/2024 159 100 - 199 mg/dL Final   LDL Chol Calc (NIH)  Date Value Ref Range Status  04/27/2024 89 0 - 99 mg/dL Final   LDL Direct  Date Value Ref Range Status  09/24/2017 116 (H) 0 - 99 mg/dL Final   HDL  Date Value Ref Range Status  04/27/2024 60 >39 mg/dL Final   Triglycerides  Date Value Ref Range Status  04/27/2024 46 0 - 149 mg/dL Final         Passed - Cr in normal range and within 360 days    Creatinine, Ser  Date Value Ref Range Status  04/27/2024 1.22 0.76 - 1.27 mg/dL Final         Passed - Patient is not pregnant      Passed - Valid encounter within last 12 months    Recent Outpatient Visits           1 week ago Lumbar spondylosis with myelopathy   [place] [facility], Leita DEL, MD   2 months ago Annual physical exam   [facility], Leita DEL, MD   7 months ago Essential hypertension   [place] [facility], Leita DEL, MD   9 months ago Hearing loss of right ear due to cerumen impaction   [facility] at [facility], Leita DEL, MD       Future Appointments             In 3 months Dunn, Bernardino HERO, PA-C [facility] [place]   In 5 months Francisca, Redell BROCKS, MD [facility] [place]

## 2024-07-09 ENCOUNTER — Encounter: Payer: Self-pay | Admitting: Family Medicine

## 2024-07-09 ENCOUNTER — Ambulatory Visit: Admitting: Family Medicine

## 2024-07-09 ENCOUNTER — Telehealth: Payer: Self-pay | Admitting: Pharmacy Technician

## 2024-07-09 VITALS — BP 96/54 | HR 73 | Ht 67.0 in | Wt 180.0 lb

## 2024-07-09 DIAGNOSIS — M4727 Other spondylosis with radiculopathy, lumbosacral region: Secondary | ICD-10-CM | POA: Insufficient documentation

## 2024-07-09 MED ORDER — PREDNISONE 20 MG PO TABS: 20.0000 mg | ORAL_TABLET | Freq: Two times a day (BID) | ORAL | 0 refills | Status: AC

## 2024-07-09 MED ORDER — METHOCARBAMOL 500 MG PO TABS: 500.0000 mg | ORAL_TABLET | Freq: Three times a day (TID) | ORAL | 0 refills | Status: AC | PRN

## 2024-07-09 NOTE — Progress Notes (Signed)
 Primary Care / Sports Medicine Office Visit  Patient Information:  Patient ID: Todd Weaver, male DOB: [birth year] Age: 73 y.o. MRN: [id number]   Todd Weaver is a pleasant 73 y.o. male presenting with the following:  Chief Complaint  Patient presents with   Back Pain    Low Back pain x 3 weeks with pain radiating down the posterior aspect of his legs. Patient does have a hx of lumbar sx in 2018. Patient did see PCP who gave I'm a dose Pak which helped a little. He also is wearing a brace to help his back pain.     Vitals:   07/09/24 1350  BP: (!) 96/54  Pulse: 73  SpO2: 96%   Vitals:   07/09/24 1350  Weight: 180 lb (81.6 kg)  Height: 5' 7 (1.702 m)   Body mass index is 28.19 kg/m.  DG Lumbar Spine 2-3 Views Result Date: 07/02/2024 CLINICAL DATA:  Low back pain status post lumbar surgery EXAM: LUMBAR SPINE - 2-3 VIEW COMPARISON:  None Available. FINDINGS: 5 nonrib bearing lumbar-type vertebral bodies. Vertebral body heights are maintained. No acute fracture. Generalized osteopenia. No static listhesis. No spondylolysis. Mild disc height loss at L3-4, L4-5 and L5-S1. Bilateral facet arthropathy at L2-3, L3-4, L4-5 and L5-S1. SI joints are unremarkable. Mild joint space narrowing of bilateral hips. Abdominal aortic atherosclerosis. Large amount of stool throughout the colon. IMPRESSION: 1. Lumbar spine spondylosis as described above. 2. No acute osseous injury of the lumbar spine. Electronically Signed   By: Julaine Blanch M.D.   On: 07/02/2024 07:37     Discussed the use of AI scribe software for clinical note transcription with the patient, who gave verbal consent to proceed.   Independent interpretation of notes and tests performed by another provider:   Results Radiology Lumbar spine X-ray (06/2024): Normal alignment, no fracture or spondylolisthesis. Preserved intervertebral disc spaces with mild narrowing at select levels. Osteophyte formation and facet joint  arthropathy consistent with degenerative changes. No acute osseous abnormality. (Independently interpreted)  Procedures performed:   None  Pertinent History, Exam, Impression, and Recommendations:   Problem List Items Addressed This Visit     Osteoarthritis of spine with radiculopathy, lumbosacral region - Primary   History of Present Illness Todd Weaver is a 73 year old male with prior lumbar and cervical spine surgery who presents with a three-week flare of low back pain with bilateral lower extremity symptoms.  Lumbar pain and radiculopathy - Three-week exacerbation of low back pain, worsened after a fall while rabbit hunting - Pain primarily localized to the left lower back - Initial radiation of pain down both legs, nearly reaching the feet - Sitting aggravates pain; ambulation and use of a back brace provide partial relief - No similar back symptoms since lumbar decompression surgery in 2018 until current episode - Activity level reduced due to pain, but remains active  Lower extremity neurological symptoms - Left leg weakness requiring support from objects when standing - No right leg weakness - No numbness or paresthesia in lower extremities except during certain movements or stretches  Bladder and bowel function - No new bowel or bladder incontinence, retention, or accidents - Able to urinate but requires support due to back pain  Prior spine surgery - Lumbar decompression surgery in 2018 without hardware placement - Cervical spine surgery in January 2024 - Asymptomatic with respect to back between lumbar surgery and current episode  Pain management and response to treatment -  Completed course of oral prednisone  with mild improvement - Baclofen  used three times daily without benefit - Uses Tylenol  as needed for pain control - No use of other over-the-counter medications during this episode  Physical Exam SPECIAL TESTS: Negative straight leg raise test  bilaterally. Positive piriformis test on the left, not recreating symptomatology. Negative FADIR test bilaterally. FABER test localizes anteriorly without representing symptomatology. Resisted iliopsoas testing recreates symptomatology, localizing infralaterally at the lumbar spine. Non-tender at the left greater trochanter. Positive chemist test, localizing left.  Assessment and Plan Lumbar spondylosis Chronic lumbar spondylosis with acute inflammatory flare due to increased activity and recent fall. Left-sided low back pain radiating to legs, intermittent left leg weakness. Imaging shows degenerative changes without acute fracture. Exacerbation of arthritis with intermittent nerve irritation, not primary nerve compression. Expected improvement with anti-inflammatory therapy, activity modification, and rehabilitation. - Prescribed prednisone  20 mg twice daily with food for 7 days. - Recommended topical diclofenac  1% gel up to four times daily until asymptomatic. - Prescribed methocarbamol  as needed up to three times daily for muscle spasm, with drowsiness counseling. - Advised to continue acetaminophen  as needed for pain control. - Provided home exercise program for spine mobilization and strengthening, to start once symptoms improve. - Advised to limit physical activity, avoid aggravating activities, and use lumbar support as tolerated. - Instructed to use lumbar brace when ambulating outside, with gradual weaning inside as symptoms improve. - Advised to monitor symptoms and contact clinic if no improvement after 1-2 weeks for possible MRI and interventional procedures.  History of lumbar spine surgery Remote lumbar spine decompression surgery in 2018 with good symptom control until current flare. No surgical complications or recurrent pathology on imaging. - Documented lumbar spine decompression surgery in 2018 in the medical record.      Relevant Medications   predniSONE  (DELTASONE ) 20 MG  tablet   methocarbamol  (ROBAXIN ) 500 MG tablet   A total of 40 minutes was spent on the date of service, 07/09/2024, encompassing both face-to-face and non-face-to-face time. This included review of prior records and imaging (e.g., MRI and/or radiographs), medical chart review, information gathering, documentation, care coordination with clinic staff, discussion and counseling with the patient regarding clinical findings and treatment options, and planning for follow-up and next steps in management.   Orders & Medications Medications:  Meds ordered this encounter  Medications   predniSONE  (DELTASONE ) 20 MG tablet    Sig: Take 1 tablet (20 mg total) by mouth 2 (two) times daily with a meal for 7 days.    Dispense:  14 tablet    Refill:  0   methocarbamol  (ROBAXIN ) 500 MG tablet    Sig: Take 1 tablet (500 mg total) by mouth every 8 (eight) hours as needed for muscle spasms.    Dispense:  30 tablet    Refill:  0   No orders of the defined types were placed in this encounter.    No follow-ups on file.     Selinda JINNY Ku, MD, [facility]   Primary Care Sports Medicine [facility]

## 2024-07-09 NOTE — Telephone Encounter (Signed)
 Pharmacy Patient Advocate Encounter  Received notification from [employer] that Prior Authorization for Repatha  has been APPROVED from 07/09/24 to 07/22/25   PA #/Case ID/Reference #: [id number]

## 2024-07-09 NOTE — Telephone Encounter (Signed)
 Pharmacy Patient Advocate Encounter   Received notification from CoverMyMeds that prior authorization for Repatha  is required/requested.   Insurance verification completed.   The patient is insured through [employer].   Per test claim: PA required; PA submitted to above mentioned insurance via Latent Key/confirmation #/EOC  [id number] Status is pending

## 2024-07-09 NOTE — Patient Instructions (Signed)
 VISIT SUMMARY:  During your visit, we discussed your recent flare-up of low back pain and associated symptoms following a fall. We reviewed your history of lumbar and cervical spine surgeries and addressed your current pain management and treatment plan.  YOUR PLAN:  LUMBAR SPONDYLOSIS: You have chronic lumbar spondylosis with an acute flare-up due to increased activity and a recent fall, causing left-sided low back pain radiating to your legs and intermittent left leg weakness. -Take prednisone  20 mg twice daily with food for 7 days. -Use topical diclofenac  1% gel up to four times daily until you are asymptomatic. -Take methocarbamol  as needed up to three times daily for muscle spasms. Be aware that it may cause drowsiness. -Continue taking acetaminophen  as needed for pain control. -Follow the home exercise program for spine mobilization and strengthening once your symptoms improve. -Limit physical activity, avoid activities that aggravate your pain, and use lumbar support as tolerated. -Use a lumbar brace when walking outside and gradually reduce its use inside as your symptoms improve. -Monitor your symptoms and contact the clinic if there is no improvement after 1-2 weeks for a possible MRI and interventional procedures.  HISTORY OF LUMBAR SPINE SURGERY: You had lumbar spine decompression surgery in 2018, which provided good symptom control until this recent flare-up. There are no surgical complications or recurrent issues on your imaging. -Your lumbar spine decompression surgery in 2018 has been documented in your medical record.

## 2024-07-09 NOTE — Assessment & Plan Note (Signed)
 History of Present Illness Todd Weaver is a 73 year old male with prior lumbar and cervical spine surgery who presents with a three-week flare of low back pain with bilateral lower extremity symptoms.  Lumbar pain and radiculopathy - Three-week exacerbation of low back pain, worsened after a fall while rabbit hunting - Pain primarily localized to the left lower back - Initial radiation of pain down both legs, nearly reaching the feet - Sitting aggravates pain; ambulation and use of a back brace provide partial relief - No similar back symptoms since lumbar decompression surgery in 2018 until current episode - Activity level reduced due to pain, but remains active  Lower extremity neurological symptoms - Left leg weakness requiring support from objects when standing - No right leg weakness - No numbness or paresthesia in lower extremities except during certain movements or stretches  Bladder and bowel function - No new bowel or bladder incontinence, retention, or accidents - Able to urinate but requires support due to back pain  Prior spine surgery - Lumbar decompression surgery in 2018 without hardware placement - Cervical spine surgery in January 2024 - Asymptomatic with respect to back between lumbar surgery and current episode  Pain management and response to treatment - Completed course of oral prednisone  with mild improvement - Baclofen  used three times daily without benefit - Uses Tylenol  as needed for pain control - No use of other over-the-counter medications during this episode  Physical Exam SPECIAL TESTS: Negative straight leg raise test bilaterally. Positive piriformis test on the left, not recreating symptomatology. Negative FADIR test bilaterally. FABER test localizes anteriorly without representing symptomatology. Resisted iliopsoas testing recreates symptomatology, localizing infralaterally at the lumbar spine. Non-tender at the left greater trochanter. Positive  chemist test, localizing left.  Assessment and Plan Lumbar spondylosis Chronic lumbar spondylosis with acute inflammatory flare due to increased activity and recent fall. Left-sided low back pain radiating to legs, intermittent left leg weakness. Imaging shows degenerative changes without acute fracture. Exacerbation of arthritis with intermittent nerve irritation, not primary nerve compression. Expected improvement with anti-inflammatory therapy, activity modification, and rehabilitation. - Prescribed prednisone  20 mg twice daily with food for 7 days. - Recommended topical diclofenac  1% gel up to four times daily until asymptomatic. - Prescribed methocarbamol  as needed up to three times daily for muscle spasm, with drowsiness counseling. - Advised to continue acetaminophen  as needed for pain control. - Provided home exercise program for spine mobilization and strengthening, to start once symptoms improve. - Advised to limit physical activity, avoid aggravating activities, and use lumbar support as tolerated. - Instructed to use lumbar brace when ambulating outside, with gradual weaning inside as symptoms improve. - Advised to monitor symptoms and contact clinic if no improvement after 1-2 weeks for possible MRI and interventional procedures.  History of lumbar spine surgery Remote lumbar spine decompression surgery in 2018 with good symptom control until current flare. No surgical complications or recurrent pathology on imaging. - Documented lumbar spine decompression surgery in 2018 in the medical record.

## 2024-07-10 ENCOUNTER — Other Ambulatory Visit: Payer: Self-pay | Admitting: Cardiovascular Disease

## 2024-08-20 ENCOUNTER — Telehealth: Payer: Self-pay | Admitting: Physician Assistant

## 2024-08-20 NOTE — Telephone Encounter (Signed)
 Patient dropped off [facility] verification form to be signed by provider, placed in nurse box.

## 2024-08-21 NOTE — Telephone Encounter (Signed)
 Pt called and notified of this form being faxed to [facility], copy made for batch scanning, and pt told he can pick-up original from front office

## 2024-08-24 ENCOUNTER — Ambulatory Visit: Admitting: Internal Medicine

## 2024-08-27 ENCOUNTER — Telehealth: Payer: Self-pay | Admitting: Physician Assistant

## 2024-08-27 NOTE — Telephone Encounter (Signed)
 Patient calling to f/u on paperwork he left with front desk person last week 1/27. Forms were for verification of chronic conditions for [employer]. Pt requesting c/b to discuss. Please advise.

## 2024-09-22 ENCOUNTER — Ambulatory Visit: Admitting: Internal Medicine

## 2024-10-09 ENCOUNTER — Ambulatory Visit: Admitting: Physician Assistant

## 2024-12-15 ENCOUNTER — Ambulatory Visit: Admitting: Urology

## 2024-12-16 ENCOUNTER — Ambulatory Visit: Admitting: Urology

## 2025-01-20 ENCOUNTER — Ambulatory Visit

## 2025-04-28 ENCOUNTER — Encounter: Admitting: Family Medicine
